# Patient Record
Sex: Female | Born: 1965
Health system: Southern US, Community
[De-identification: ages and names within clinical notes are randomized; demographics above are authoritative.]

## PROBLEM LIST (undated history)

## (undated) VITALS — BP 136/86 | HR 98 | Temp 97.9°F | Resp 18 | Ht 63.0 in | Wt 137.0 lb

## (undated) DIAGNOSIS — T1490XA Injury, unspecified, initial encounter: Secondary | ICD-10-CM

## (undated) DIAGNOSIS — K029 Dental caries, unspecified: Secondary | ICD-10-CM

## (undated) DIAGNOSIS — Z5189 Encounter for other specified aftercare: Secondary | ICD-10-CM

## (undated) DIAGNOSIS — F319 Bipolar disorder, unspecified: Secondary | ICD-10-CM

## (undated) DIAGNOSIS — K219 Gastro-esophageal reflux disease without esophagitis: Secondary | ICD-10-CM

## (undated) DIAGNOSIS — F419 Anxiety disorder, unspecified: Secondary | ICD-10-CM

## (undated) DIAGNOSIS — Z973 Presence of spectacles and contact lenses: Secondary | ICD-10-CM

## (undated) DIAGNOSIS — D573 Sickle-cell trait: Secondary | ICD-10-CM

## (undated) DIAGNOSIS — F329 Major depressive disorder, single episode, unspecified: Secondary | ICD-10-CM

## (undated) DIAGNOSIS — E119 Type 2 diabetes mellitus without complications: Secondary | ICD-10-CM

## (undated) DIAGNOSIS — D689 Coagulation defect, unspecified: Secondary | ICD-10-CM

## (undated) DIAGNOSIS — G8929 Other chronic pain: Secondary | ICD-10-CM

## (undated) DIAGNOSIS — R7303 Prediabetes: Secondary | ICD-10-CM

## (undated) DIAGNOSIS — F32A Depression, unspecified: Secondary | ICD-10-CM

## (undated) DIAGNOSIS — I10 Essential (primary) hypertension: Secondary | ICD-10-CM

## (undated) DIAGNOSIS — D649 Anemia, unspecified: Secondary | ICD-10-CM

## (undated) HISTORY — PX: OTHER SURGICAL HISTORY: SHX169

## (undated) HISTORY — DX: Type 2 diabetes mellitus without complications: E11.9

## (undated) HISTORY — DX: Injury, unspecified, initial encounter: T14.90XA

## (undated) HISTORY — PX: TUBAL LIGATION: SHX77

## (undated) HISTORY — DX: Coagulation defect, unspecified: D68.9

## (undated) HISTORY — PX: FACIAL RECONSTRUCTION SURGERY: SHX631

## (undated) HISTORY — PX: COLONOSCOPY: SHX174

## (undated) HISTORY — PX: FOOT SURGERY: SHX648

## (undated) HISTORY — DX: Encounter for other specified aftercare: Z51.89

---

## 1998-01-22 ENCOUNTER — Other Ambulatory Visit: Admission: RE | Admit: 1998-01-22 | Discharge: 1998-01-22 | Payer: Self-pay | Admitting: Internal Medicine

## 1998-02-23 ENCOUNTER — Emergency Department (HOSPITAL_COMMUNITY): Admission: EM | Admit: 1998-02-23 | Discharge: 1998-02-23 | Payer: Self-pay | Admitting: Emergency Medicine

## 1998-04-15 ENCOUNTER — Emergency Department (HOSPITAL_COMMUNITY): Admission: EM | Admit: 1998-04-15 | Discharge: 1998-04-15 | Payer: Self-pay | Admitting: Emergency Medicine

## 1998-09-10 ENCOUNTER — Emergency Department (HOSPITAL_COMMUNITY): Admission: EM | Admit: 1998-09-10 | Discharge: 1998-09-10 | Payer: Self-pay | Admitting: Emergency Medicine

## 1999-01-30 ENCOUNTER — Encounter: Payer: Self-pay | Admitting: Emergency Medicine

## 1999-01-30 ENCOUNTER — Emergency Department (HOSPITAL_COMMUNITY): Admission: EM | Admit: 1999-01-30 | Discharge: 1999-01-30 | Payer: Self-pay | Admitting: Emergency Medicine

## 1999-04-02 ENCOUNTER — Emergency Department (HOSPITAL_COMMUNITY): Admission: EM | Admit: 1999-04-02 | Discharge: 1999-04-02 | Payer: Self-pay | Admitting: Emergency Medicine

## 2002-08-10 ENCOUNTER — Emergency Department (HOSPITAL_COMMUNITY): Admission: EM | Admit: 2002-08-10 | Discharge: 2002-08-10 | Payer: Self-pay | Admitting: Emergency Medicine

## 2002-08-10 ENCOUNTER — Encounter: Payer: Self-pay | Admitting: Emergency Medicine

## 2002-11-16 ENCOUNTER — Emergency Department (HOSPITAL_COMMUNITY): Admission: EM | Admit: 2002-11-16 | Discharge: 2002-11-16 | Payer: Self-pay | Admitting: Emergency Medicine

## 2003-04-22 ENCOUNTER — Emergency Department (HOSPITAL_COMMUNITY): Admission: EM | Admit: 2003-04-22 | Discharge: 2003-04-22 | Payer: Self-pay | Admitting: Emergency Medicine

## 2003-08-18 ENCOUNTER — Emergency Department (HOSPITAL_COMMUNITY): Admission: EM | Admit: 2003-08-18 | Discharge: 2003-08-18 | Payer: Self-pay

## 2003-08-18 ENCOUNTER — Emergency Department (HOSPITAL_COMMUNITY): Admission: EM | Admit: 2003-08-18 | Discharge: 2003-08-18 | Payer: Self-pay | Admitting: Emergency Medicine

## 2003-08-21 ENCOUNTER — Emergency Department (HOSPITAL_COMMUNITY): Admission: EM | Admit: 2003-08-21 | Discharge: 2003-08-21 | Payer: Self-pay | Admitting: Emergency Medicine

## 2003-11-04 ENCOUNTER — Encounter: Admission: RE | Admit: 2003-11-04 | Discharge: 2003-11-04 | Payer: Self-pay | Admitting: Obstetrics and Gynecology

## 2003-11-04 ENCOUNTER — Encounter (INDEPENDENT_AMBULATORY_CARE_PROVIDER_SITE_OTHER): Payer: Self-pay | Admitting: Specialist

## 2003-12-19 ENCOUNTER — Emergency Department (HOSPITAL_COMMUNITY): Admission: EM | Admit: 2003-12-19 | Discharge: 2003-12-19 | Payer: Self-pay | Admitting: Emergency Medicine

## 2004-05-18 ENCOUNTER — Emergency Department (HOSPITAL_COMMUNITY): Admission: EM | Admit: 2004-05-18 | Discharge: 2004-05-18 | Payer: Self-pay | Admitting: Emergency Medicine

## 2004-06-24 ENCOUNTER — Emergency Department (HOSPITAL_COMMUNITY): Admission: EM | Admit: 2004-06-24 | Discharge: 2004-06-24 | Payer: Self-pay | Admitting: *Deleted

## 2004-07-15 ENCOUNTER — Ambulatory Visit: Payer: Self-pay | Admitting: Psychiatry

## 2004-07-15 ENCOUNTER — Inpatient Hospital Stay (HOSPITAL_COMMUNITY): Admission: EM | Admit: 2004-07-15 | Discharge: 2004-07-21 | Payer: Self-pay | Admitting: Psychiatry

## 2004-07-29 ENCOUNTER — Emergency Department (HOSPITAL_COMMUNITY): Admission: EM | Admit: 2004-07-29 | Discharge: 2004-07-29 | Payer: Self-pay | Admitting: Emergency Medicine

## 2004-08-04 ENCOUNTER — Emergency Department (HOSPITAL_COMMUNITY): Admission: EM | Admit: 2004-08-04 | Discharge: 2004-08-04 | Payer: Self-pay

## 2004-08-13 ENCOUNTER — Emergency Department (HOSPITAL_COMMUNITY): Admission: EM | Admit: 2004-08-13 | Discharge: 2004-08-13 | Payer: Self-pay | Admitting: Emergency Medicine

## 2004-09-18 ENCOUNTER — Emergency Department (HOSPITAL_COMMUNITY): Admission: EM | Admit: 2004-09-18 | Discharge: 2004-09-18 | Payer: Self-pay | Admitting: Emergency Medicine

## 2004-11-04 ENCOUNTER — Other Ambulatory Visit: Admission: RE | Admit: 2004-11-04 | Discharge: 2004-11-04 | Payer: Self-pay | Admitting: Family Medicine

## 2004-11-04 ENCOUNTER — Ambulatory Visit: Payer: Self-pay | Admitting: Family Medicine

## 2004-11-04 ENCOUNTER — Encounter (INDEPENDENT_AMBULATORY_CARE_PROVIDER_SITE_OTHER): Payer: Self-pay | Admitting: Specialist

## 2004-11-08 ENCOUNTER — Ambulatory Visit (HOSPITAL_COMMUNITY): Admission: RE | Admit: 2004-11-08 | Discharge: 2004-11-08 | Payer: Self-pay | Admitting: Obstetrics and Gynecology

## 2004-12-10 ENCOUNTER — Ambulatory Visit (HOSPITAL_COMMUNITY): Admission: RE | Admit: 2004-12-10 | Discharge: 2004-12-10 | Payer: Self-pay | Admitting: Orthopaedic Surgery

## 2005-03-08 ENCOUNTER — Emergency Department (HOSPITAL_COMMUNITY): Admission: EM | Admit: 2005-03-08 | Discharge: 2005-03-08 | Payer: Self-pay | Admitting: Emergency Medicine

## 2005-08-29 ENCOUNTER — Emergency Department (HOSPITAL_COMMUNITY): Admission: EM | Admit: 2005-08-29 | Discharge: 2005-08-29 | Payer: Self-pay | Admitting: Emergency Medicine

## 2005-09-06 ENCOUNTER — Emergency Department (HOSPITAL_COMMUNITY): Admission: EM | Admit: 2005-09-06 | Discharge: 2005-09-06 | Payer: Self-pay | Admitting: Emergency Medicine

## 2006-01-17 ENCOUNTER — Emergency Department (HOSPITAL_COMMUNITY): Admission: EM | Admit: 2006-01-17 | Discharge: 2006-01-17 | Payer: Self-pay | Admitting: Emergency Medicine

## 2006-02-04 ENCOUNTER — Emergency Department (HOSPITAL_COMMUNITY): Admission: EM | Admit: 2006-02-04 | Discharge: 2006-02-04 | Payer: Self-pay | Admitting: Emergency Medicine

## 2006-07-15 ENCOUNTER — Emergency Department (HOSPITAL_COMMUNITY): Admission: EM | Admit: 2006-07-15 | Discharge: 2006-07-16 | Payer: Self-pay | Admitting: Emergency Medicine

## 2007-10-16 ENCOUNTER — Emergency Department (HOSPITAL_COMMUNITY): Admission: EM | Admit: 2007-10-16 | Discharge: 2007-10-17 | Payer: Self-pay | Admitting: Emergency Medicine

## 2007-12-02 ENCOUNTER — Emergency Department (HOSPITAL_COMMUNITY): Admission: EM | Admit: 2007-12-02 | Discharge: 2007-12-02 | Payer: Self-pay | Admitting: Emergency Medicine

## 2008-10-24 ENCOUNTER — Emergency Department (HOSPITAL_COMMUNITY): Admission: EM | Admit: 2008-10-24 | Discharge: 2008-10-24 | Payer: Self-pay | Admitting: Emergency Medicine

## 2008-10-29 ENCOUNTER — Emergency Department (HOSPITAL_COMMUNITY): Admission: EM | Admit: 2008-10-29 | Discharge: 2008-10-29 | Payer: Self-pay | Admitting: Emergency Medicine

## 2008-12-28 ENCOUNTER — Emergency Department (HOSPITAL_COMMUNITY): Admission: EM | Admit: 2008-12-28 | Discharge: 2008-12-28 | Payer: Self-pay | Admitting: Emergency Medicine

## 2009-01-22 ENCOUNTER — Emergency Department (HOSPITAL_COMMUNITY): Admission: EM | Admit: 2009-01-22 | Discharge: 2009-01-22 | Payer: Self-pay | Admitting: Emergency Medicine

## 2009-04-05 ENCOUNTER — Emergency Department (HOSPITAL_COMMUNITY): Admission: EM | Admit: 2009-04-05 | Discharge: 2009-04-05 | Payer: Self-pay | Admitting: Emergency Medicine

## 2009-04-16 ENCOUNTER — Emergency Department (HOSPITAL_COMMUNITY): Admission: EM | Admit: 2009-04-16 | Discharge: 2009-04-17 | Payer: Self-pay | Admitting: Emergency Medicine

## 2009-04-18 ENCOUNTER — Emergency Department (HOSPITAL_COMMUNITY): Admission: EM | Admit: 2009-04-18 | Discharge: 2009-04-19 | Payer: Self-pay | Admitting: Emergency Medicine

## 2009-04-29 ENCOUNTER — Emergency Department (HOSPITAL_COMMUNITY): Admission: EM | Admit: 2009-04-29 | Discharge: 2009-04-29 | Payer: Self-pay | Admitting: Emergency Medicine

## 2009-05-11 ENCOUNTER — Ambulatory Visit (HOSPITAL_COMMUNITY): Admission: RE | Admit: 2009-05-11 | Discharge: 2009-05-11 | Payer: Self-pay | Admitting: Family Medicine

## 2009-05-11 ENCOUNTER — Emergency Department (HOSPITAL_COMMUNITY): Admission: EM | Admit: 2009-05-11 | Discharge: 2009-05-11 | Payer: Self-pay | Admitting: Emergency Medicine

## 2009-05-11 ENCOUNTER — Encounter: Payer: Self-pay | Admitting: Physician Assistant

## 2009-06-03 ENCOUNTER — Emergency Department (HOSPITAL_COMMUNITY): Admission: EM | Admit: 2009-06-03 | Discharge: 2009-06-03 | Payer: Self-pay | Admitting: Emergency Medicine

## 2009-07-10 ENCOUNTER — Encounter: Payer: Self-pay | Admitting: Physician Assistant

## 2009-07-15 ENCOUNTER — Encounter: Payer: Self-pay | Admitting: Physician Assistant

## 2009-07-15 ENCOUNTER — Ambulatory Visit: Payer: Self-pay | Admitting: Psychiatry

## 2009-07-15 ENCOUNTER — Emergency Department (HOSPITAL_COMMUNITY): Admission: EM | Admit: 2009-07-15 | Discharge: 2009-07-16 | Payer: Self-pay | Admitting: Emergency Medicine

## 2009-07-16 ENCOUNTER — Inpatient Hospital Stay (HOSPITAL_COMMUNITY): Admission: AD | Admit: 2009-07-16 | Discharge: 2009-07-18 | Payer: Self-pay | Admitting: Psychiatry

## 2009-07-30 ENCOUNTER — Ambulatory Visit: Payer: Self-pay | Admitting: Physician Assistant

## 2009-07-30 DIAGNOSIS — K219 Gastro-esophageal reflux disease without esophagitis: Secondary | ICD-10-CM | POA: Insufficient documentation

## 2009-07-30 DIAGNOSIS — L84 Corns and callosities: Secondary | ICD-10-CM | POA: Insufficient documentation

## 2009-07-30 DIAGNOSIS — E782 Mixed hyperlipidemia: Secondary | ICD-10-CM

## 2009-07-30 DIAGNOSIS — F1020 Alcohol dependence, uncomplicated: Secondary | ICD-10-CM | POA: Insufficient documentation

## 2009-07-30 DIAGNOSIS — D649 Anemia, unspecified: Secondary | ICD-10-CM

## 2009-07-30 DIAGNOSIS — D573 Sickle-cell trait: Secondary | ICD-10-CM | POA: Insufficient documentation

## 2009-07-30 LAB — CONVERTED CEMR LAB
Blood in Urine, dipstick: NEGATIVE
Ketones, urine, test strip: NEGATIVE
Nitrite: NEGATIVE
Protein, U semiquant: 30
Urobilinogen, UA: 0.2

## 2009-08-03 LAB — CONVERTED CEMR LAB
Albumin: 5 g/dL (ref 3.5–5.2)
Alkaline Phosphatase: 71 units/L (ref 39–117)
BUN: 11 mg/dL (ref 6–23)
Barbiturate Quant, Ur: NEGATIVE
Basophils Absolute: 0.2 10*3/uL — ABNORMAL HIGH (ref 0.0–0.1)
Calcium: 10.3 mg/dL (ref 8.4–10.5)
Cocaine Metabolites: NEGATIVE
Creatinine, Ser: 0.79 mg/dL (ref 0.40–1.20)
Creatinine,U: 73.1 mg/dL
Eosinophils Absolute: 0.2 10*3/uL (ref 0.0–0.7)
Eosinophils Relative: 2 % (ref 0–5)
Glucose, Bld: 77 mg/dL (ref 70–99)
Lymphocytes Relative: 34 % (ref 12–46)
MCHC: 33.5 g/dL (ref 30.0–36.0)
MCV: 91.6 fL (ref 78.0–100.0)
Marijuana Metabolite: NEGATIVE
Monocytes Absolute: 0.5 10*3/uL (ref 0.1–1.0)
Opiate Screen, Urine: NEGATIVE
Phencyclidine (PCP): NEGATIVE
Platelets: 266 10*3/uL (ref 150–400)
Potassium: 3.9 meq/L (ref 3.5–5.3)
RDW: 17.7 % — ABNORMAL HIGH (ref 11.5–15.5)
TSH: 2.399 microintl units/mL (ref 0.350–4.500)

## 2009-08-20 ENCOUNTER — Ambulatory Visit: Payer: Self-pay | Admitting: Physician Assistant

## 2009-08-20 ENCOUNTER — Telehealth: Payer: Self-pay | Admitting: Physician Assistant

## 2009-08-20 DIAGNOSIS — K047 Periapical abscess without sinus: Secondary | ICD-10-CM

## 2009-08-20 DIAGNOSIS — F329 Major depressive disorder, single episode, unspecified: Secondary | ICD-10-CM

## 2009-08-20 DIAGNOSIS — F418 Other specified anxiety disorders: Secondary | ICD-10-CM | POA: Insufficient documentation

## 2009-08-20 HISTORY — DX: Periapical abscess without sinus: K04.7

## 2009-08-20 LAB — CONVERTED CEMR LAB
Blood in Urine, dipstick: NEGATIVE
Glucose, Urine, Semiquant: NEGATIVE
Nitrite: NEGATIVE
Specific Gravity, Urine: >=1.03
WBC Urine, dipstick: NEGATIVE
pH: 5.5

## 2009-08-25 ENCOUNTER — Encounter: Payer: Self-pay | Admitting: Physician Assistant

## 2009-08-26 ENCOUNTER — Telehealth: Payer: Self-pay | Admitting: Physician Assistant

## 2009-09-15 ENCOUNTER — Encounter: Payer: Self-pay | Admitting: Physician Assistant

## 2009-09-17 ENCOUNTER — Telehealth: Payer: Self-pay | Admitting: Physician Assistant

## 2009-09-21 ENCOUNTER — Encounter: Payer: Self-pay | Admitting: Physician Assistant

## 2009-11-15 ENCOUNTER — Emergency Department (HOSPITAL_COMMUNITY): Admission: EM | Admit: 2009-11-15 | Discharge: 2009-11-15 | Payer: Self-pay | Admitting: Emergency Medicine

## 2009-11-16 ENCOUNTER — Encounter (INDEPENDENT_AMBULATORY_CARE_PROVIDER_SITE_OTHER): Payer: Self-pay | Admitting: Internal Medicine

## 2009-11-16 ENCOUNTER — Observation Stay (HOSPITAL_COMMUNITY): Admission: EM | Admit: 2009-11-16 | Discharge: 2009-11-18 | Payer: Self-pay | Admitting: Emergency Medicine

## 2009-11-18 ENCOUNTER — Telehealth: Payer: Self-pay | Admitting: Physician Assistant

## 2009-11-20 ENCOUNTER — Telehealth: Payer: Self-pay | Admitting: Physician Assistant

## 2009-11-24 ENCOUNTER — Telehealth: Payer: Self-pay | Admitting: Physician Assistant

## 2009-12-01 ENCOUNTER — Telehealth: Payer: Self-pay | Admitting: Physician Assistant

## 2009-12-08 ENCOUNTER — Telehealth (INDEPENDENT_AMBULATORY_CARE_PROVIDER_SITE_OTHER): Payer: Self-pay | Admitting: *Deleted

## 2009-12-08 ENCOUNTER — Ambulatory Visit: Payer: Self-pay | Admitting: Physician Assistant

## 2009-12-08 DIAGNOSIS — R319 Hematuria, unspecified: Secondary | ICD-10-CM

## 2009-12-08 DIAGNOSIS — K625 Hemorrhage of anus and rectum: Secondary | ICD-10-CM

## 2009-12-08 LAB — CONVERTED CEMR LAB
Pap Smear: NEGATIVE
Protein, U semiquant: 30
Specific Gravity, Urine: >=1.03
Urobilinogen, UA: 0.2
pH: 5

## 2009-12-09 ENCOUNTER — Encounter: Payer: Self-pay | Admitting: Physician Assistant

## 2009-12-09 LAB — CONVERTED CEMR LAB
ALT: 9 units/L (ref 0–35)
AST: 15 units/L (ref 0–37)
Albumin: 4.5 g/dL (ref 3.5–5.2)
Alkaline Phosphatase: 59 units/L (ref 39–117)
Basophils Relative: 4 % — ABNORMAL HIGH (ref 0–1)
Casts: NONE SEEN /lpf
Crystals: NONE SEEN
Eosinophils Absolute: 0.2 10*3/uL (ref 0.0–0.7)
Eosinophils Relative: 5 % (ref 0–5)
HCT: 31.8 % — ABNORMAL LOW (ref 36.0–46.0)
Hemoglobin: 10.4 g/dL — ABNORMAL LOW (ref 12.0–15.0)
LDL Cholesterol: 87 mg/dL (ref 0–99)
Lymphocytes Relative: 42 % (ref 12–46)
MCHC: 32.7 g/dL (ref 30.0–36.0)
Monocytes Absolute: 0.4 10*3/uL (ref 0.1–1.0)
Monocytes Relative: 10 % (ref 3–12)
Potassium: 4.2 meq/L (ref 3.5–5.3)
RBC: 3.64 M/uL — ABNORMAL LOW (ref 3.87–5.11)
RDW: 18.2 % — ABNORMAL HIGH (ref 11.5–15.5)
Sodium: 146 meq/L — ABNORMAL HIGH (ref 135–145)
Total Protein: 7.1 g/dL (ref 6.0–8.3)
WBC: 4.3 10*3/uL (ref 4.0–10.5)

## 2009-12-10 ENCOUNTER — Encounter: Payer: Self-pay | Admitting: Physician Assistant

## 2009-12-10 ENCOUNTER — Telehealth: Payer: Self-pay | Admitting: Physician Assistant

## 2009-12-11 ENCOUNTER — Encounter: Payer: Self-pay | Admitting: Physician Assistant

## 2009-12-11 LAB — CONVERTED CEMR LAB
Iron: 173 ug/dL — ABNORMAL HIGH (ref 42–145)
Saturation Ratios: 36 % (ref 20–55)
TIBC: 486 ug/dL — ABNORMAL HIGH (ref 250–470)
UIBC: 313 ug/dL
Vitamin B-12: 320 pg/mL (ref 211–911)

## 2009-12-21 ENCOUNTER — Encounter: Payer: Self-pay | Admitting: Physician Assistant

## 2009-12-25 ENCOUNTER — Ambulatory Visit: Payer: Self-pay | Admitting: Physician Assistant

## 2009-12-28 ENCOUNTER — Telehealth: Payer: Self-pay | Admitting: Physician Assistant

## 2009-12-28 ENCOUNTER — Encounter: Payer: Self-pay | Admitting: Physician Assistant

## 2009-12-28 LAB — CONVERTED CEMR LAB
Basophils Absolute: 0 10*3/uL (ref 0.0–0.1)
Basophils Relative: 1 % (ref 0–1)
Eosinophils Absolute: 0.1 10*3/uL (ref 0.0–0.7)
HCT: 31.2 % — ABNORMAL LOW (ref 36.0–46.0)
MCHC: 33 g/dL (ref 30.0–36.0)
MCV: 88.9 fL (ref 78.0–100.0)
Monocytes Absolute: 0.4 10*3/uL (ref 0.1–1.0)
Monocytes Relative: 9 % (ref 3–12)
Neutro Abs: 1.4 10*3/uL — ABNORMAL LOW (ref 1.7–7.7)
Neutrophils Relative %: 31 % — ABNORMAL LOW (ref 43–77)
Platelets: 219 10*3/uL (ref 150–400)
RDW: 18 % — ABNORMAL HIGH (ref 11.5–15.5)
WBC: 4.6 10*3/uL (ref 4.0–10.5)

## 2009-12-30 ENCOUNTER — Telehealth: Payer: Self-pay | Admitting: Physician Assistant

## 2009-12-30 ENCOUNTER — Ambulatory Visit: Payer: Self-pay | Admitting: Physician Assistant

## 2010-01-04 ENCOUNTER — Encounter (INDEPENDENT_AMBULATORY_CARE_PROVIDER_SITE_OTHER): Payer: Self-pay | Admitting: *Deleted

## 2010-01-11 ENCOUNTER — Ambulatory Visit: Payer: Self-pay | Admitting: Psychiatry

## 2010-01-11 ENCOUNTER — Inpatient Hospital Stay (HOSPITAL_COMMUNITY): Admission: AD | Admit: 2010-01-11 | Discharge: 2010-01-14 | Payer: Self-pay | Admitting: Psychiatry

## 2010-01-11 ENCOUNTER — Emergency Department (HOSPITAL_COMMUNITY): Admission: EM | Admit: 2010-01-11 | Discharge: 2010-01-12 | Payer: Self-pay | Admitting: Emergency Medicine

## 2010-01-26 ENCOUNTER — Telehealth: Payer: Self-pay | Admitting: Physician Assistant

## 2010-01-29 ENCOUNTER — Encounter: Payer: Self-pay | Admitting: Physician Assistant

## 2010-02-15 ENCOUNTER — Telehealth (INDEPENDENT_AMBULATORY_CARE_PROVIDER_SITE_OTHER): Payer: Self-pay | Admitting: Nurse Practitioner

## 2010-02-25 ENCOUNTER — Telehealth (INDEPENDENT_AMBULATORY_CARE_PROVIDER_SITE_OTHER): Payer: Self-pay | Admitting: Internal Medicine

## 2010-03-08 ENCOUNTER — Telehealth: Payer: Self-pay | Admitting: Physician Assistant

## 2010-03-18 ENCOUNTER — Emergency Department (HOSPITAL_COMMUNITY): Admission: EM | Admit: 2010-03-18 | Discharge: 2009-08-25 | Payer: Self-pay | Admitting: Emergency Medicine

## 2010-04-02 ENCOUNTER — Encounter (INDEPENDENT_AMBULATORY_CARE_PROVIDER_SITE_OTHER): Payer: Self-pay | Admitting: *Deleted

## 2010-04-22 ENCOUNTER — Ambulatory Visit: Admit: 2010-04-22 | Payer: Self-pay | Admitting: Internal Medicine

## 2010-05-02 ENCOUNTER — Encounter: Payer: Self-pay | Admitting: *Deleted

## 2010-05-11 NOTE — Letter (Signed)
Summary: BEHAVIORAL HEALTH NOTES  BEHAVIORAL HEALTH NOTES   Imported By: Arta Bruce 08/31/2009 10:00:58  _____________________________________________________________________  External Attachment:    Type:   Image     Comment:   External Document

## 2010-05-11 NOTE — Letter (Signed)
Summary: [facility]DS FROM [facility]  REQUESTING [facility]DS FROM [facility]   Imported By: Arta Bruce 10/28/2009 15:00:41  _____________________________________________________________________  External Attachment:    Type:   Image     Comment:   External Document

## 2010-05-11 NOTE — Progress Notes (Signed)
Summary: Dental Referral  Phone Note Outgoing Call   Summary of Call: Needs dental referral for dental abscess.  Please note she is taking Ibuprofen for pain due to medical reasons. Initial call taken by: Brynda Rim,  Aug 20, 2009 10:25 AM

## 2010-05-11 NOTE — Assessment & Plan Note (Signed)
Summary: NEW PT, PAIN IN FEET/LR LIVES CLOSER TO HSN/LR   Vital Signs:  Patient profile:   45 year old female Height:      65 inches Weight:      129 pounds BMI:     21.54 Temp:     98.4 degrees F oral Pulse rate:   111 / minute Pulse rhythm:   regular Resp:     18 per minute BP sitting:   163 / 121  (left arm) Cuff size:   regular  Vitals Entered By: Armenia Shannon (July 30, 2009 3:02 PM)  Serial Vital Signs/Assessments:  Time      Position  BP       Pulse  Resp  Temp     By 4:03 PM             160/110                        Tereso Newcomer PA-C  CC: pt is denist referral....  pt needs referral for feet.... , Hypertension Management Is Patient Diabetic? No Pain Assessment Patient in pain? no       Does patient need assistance? Functional Status Self care Ambulation Normal   Primary Care Provider:  Tereso Newcomer, PA-C  CC:  pt is denist referral....  pt needs referral for feet....  and Hypertension Management.  History of Present Illness: New Patient.  Alcoholism:  She was d/c [facility] 2 weeks ago.  She went through detox.  However, she is still drinking.  She wants to go to her daughter's graduation in 2 weeks.  She wants to go through detox again but does not want to go until after her daughter's graduation.  She drinks 2 1/5 of Vodka per day.  She drinks every day.  No real seizures.  She does "black out" a lot when she drinks.  Hypertension History:      She complains of headache, but denies chest pain and syncope.        Positive major cardiovascular risk factors include hyperlipidemia, hypertension, and current tobacco user.  Negative major cardiovascular risk factors include female age less than 44 years old.    Habits & Providers  Alcohol-Tobacco-Diet     Alcohol drinks/day: >5     Alcohol type: spirits     Feels need to cut down: yes     Feels annoyed by complaints: yes     Feels guilty re: drinking: yes     Needs 'eye opener' in am: yes  Tobacco Status: current     Cigarette Packs/Day: 2.0  Exercise-Depression-Behavior     Drug Use: no  Current Medications (verified): 1)  Famotidine 20 Mg Tabs (Famotidine) .... One Tab By Mouth Every Day 2)  Tramadol Hcl 50 Mg Tabs (Tramadol Hcl) .... Take One To Two Tab By Mouth 6 Hours As Needed For Pain 3)  Lorazepam 1 Mg Tabs (Lorazepam) .... One Tab By Mouth Every 8 Hours  Allergies (verified): No Known Drug Allergies  Past History:  Past Medical History: Hypertension   a.  previously on medications Sickle Cell Trait Anemia-NOS GERD Hyperlipidemia   a.  not sure if ever took meds Alcoholism   a.  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 Family History of Alcoholism/Addiction - father  Social History: unemployed  Divorced Current Smoker Alcohol use-yes Drug use-no 3 kids  Smoking Status:  current Packs/Day:  2.0 Drug Use:  no  Review of Systems       The patient complains of dyspnea on exertion.  The patient denies fever, chest pain, melena, hematochezia, and hematuria.    Physical Exam  General:  alert, well-developed, and well-nourished.   Head:  normocephalic and atraumatic.   Eyes:  pupils equal, pupils round, pupils reactive to light, and conjunctival injection.   Ears:  R ear normal and L ear normal.   Nose:  no external deformity.   Mouth:  pharynx pink and moist.   Neck:  supple and no cervical lymphadenopathy.   Lungs:  normal breath sounds.   Heart:  normal rate and regular rhythm.   Abdomen:  soft and non-tender.   Neurologic:  alert & oriented X3 and cranial  nerves II-XII intact.   Psych:  normally interactive and tearful.     Impression & Recommendations:  Problem # 1:  ALCOHOLISM (ICD-303.90) encouraged her to meet with our counselors to help with treatment she declines and states she is going to go to the ER when she gets back from her daughter's graduation explained to her that her current situation with alcohol and smoking is slowly killing her she understands and wants to stop drinking  Problem # 2:  HYPERTENSION (ICD-401.9)  start norvasc 5 mg once daily f/u with me in 2 weeks  Orders: T-Comprehensive Metabolic Panel ([id number]) T-TSH (09811-91478)  Her updated medication list for this problem includes:    Amlodipine Besylate 5 Mg Tabs (Amlodipine besylate) .Marland Kitchen... Take 1 tablet by mouth once a day for blood pressure  Problem # 3:  CALLUS, FOOT (ICD-700) refer to foot clinic for help with treatment of her calluses  Problem # 4:  SICKLE-CELL TRAIT (ICD-282.5)  check labs  Orders: T-CBC w/Diff ([id number])  Complete Medication List: 1)  Famotidine 20 Mg Tabs (Famotidine) .... Take 1 tablet by mouth two times a day 2)  Tramadol Hcl 50 Mg Tabs (Tramadol hcl) .... Take one to two tab by mouth 6 hours as needed for pain 3)  Amlodipine Besylate 5 Mg Tabs (Amlodipine besylate) .... Take 1 tablet by mouth once a day for blood pressure  Other Orders: T-Drug Screen-Urine, (single) [phone]) T-HIV Viral Load ([phone]) T-Syphilis Test (RPR) [phone])  Hypertension Assessment/Plan:      The patient's hypertensive risk group is category B: At least one risk factor (excluding diabetes) with no target organ damage.  Today's blood pressure is 163/121.  Her blood pressure goal is < 140/90.   Patient Instructions: 1)  Sign forms to get records from foot doctors that operated on your feet. 2)  Schedule appt with foot clinic for calluses. 3)  Please schedule a follow-up appointment in 2 weeks with Savon Bordonaro for blood  pressure.  4)  Tobacco is very bad for your health and your loved ones ! You should stop smoking !  5)  Stop smoking tips: Choose a quit date. Cut down before the quit date. Decide what you will do as a substitute when you feel the urge to smoke(gum, toothpick, exercise).  Prescriptions: FAMOTIDINE 20 MG TABS (FAMOTIDINE) Take  1 tablet by mouth two times a day  #60 x 5   Entered and Authorized by:   Tereso Newcomer PA-C   Signed by:   Tereso Newcomer PA-C on 07/30/2009   Method used:   Print then Give to Patient   RxID:   [id number] AMLODIPINE BESYLATE 5 MG TABS (AMLODIPINE BESYLATE) Take 1 tablet by mouth once a day for blood pressure  #30 x 5   Entered and Authorized by:   Tereso Newcomer PA-C   Signed by:   Tereso Newcomer PA-C on 07/30/2009   Method used:   Print then Give to Patient   RxID:   [id number]   Laboratory Results   Urine Tests  Date/Time Received: July 30, 2009 4:20 PM   Routine Urinalysis   Glucose: negative   (Normal Range: Negative) Bilirubin: negative   (Normal Range: Negative) Ketone: negative   (Normal Range: Negative) Spec. Gravity: 1.020   (Normal Range: 1.003-1.035) Blood: negative   (Normal Range: Negative) pH: 6.0   (Normal Range: 5.0-8.0) Protein: 30   (Normal Range: Negative) Urobilinogen: 0.2   (Normal Range: 0-1) Nitrite: negative   (Normal Range: Negative) Leukocyte Esterace: trace   (Normal Range: Negative)      Other Tests  Rapid HIV: negative

## 2010-05-11 NOTE — Progress Notes (Signed)
Summary: REFILLS  Phone Note Call from Patient   Summary of Call: THE PT NEEDS MORE REFILLS FROM TRAZADONE AND IBUPROFEN.  ([facility] [address])  WEAVER PA-C Initial call taken by: Manon Hilding,  November 18, 2009 3:12 PM  Follow-up for Phone Call        forward to provider Follow-up by: Armenia Shannon,  November 18, 2009 4:36 PM  Additional Follow-up for Phone Call Additional follow up Details #1::        I have not prescribed her Trazodone before.  Will not fill that.  Additional Follow-up by: Tereso Newcomer PA-C,  November 18, 2009 5:35 PM    Additional Follow-up for Phone Call Additional follow up Details #2::    number is disconnected. .... Armenia Shannon  November 19, 2009 10:45 AM   Left message on answering machine for pt to call back.Marland KitchenMarland KitchenArmenia Shannon  November 20, 2009 12:41 PM   Prescriptions: IBUPROFEN 800 MG TABS (IBUPROFEN) Take 1 tablet by mouth three times a day with food as needed for pain  #60 x 0   Entered and Authorized by:   Tereso Newcomer PA-C   Signed by:   Tereso Newcomer PA-C on 11/18/2009   Method used:   Electronically to        [facility] [phone]* (retail)       [address]       [place], [place]  [address]       Ph: [phone]       Fax: [phone]   RxID:   [id number]   Appended Document: REFILLS I didnt mean to sign off phone note....... Armenia Shannon  November 20, 2009 12:42 PM    i have another phone note (11-20-09) with same information is open.... Armenia Shannon  November 23, 2009 2:40 PM

## 2010-05-11 NOTE — Progress Notes (Signed)
Summary: NEEDS REFILLS  Phone Note Call from Patient Call back at Home Phone [phone]   Reason for Call: Refill Medication Summary of Call: WEAVER PT. MS Macadam SAYS THAT SHE NEEDS REFILLS AMLODIPINE, TRAZADONE, AND THE FAMOTIDINE. SHE [facility] ON [address] Initial call taken by: Leodis Rains,  November 20, 2009 9:54 AM  Follow-up for Phone Call        forward to provider....had not had a chance to let her know about the trazadone yet but will let her know once she calls me back Follow-up by: Armenia Shannon,  November 20, 2009 12:39 PM  Additional Follow-up for Phone Call Additional follow up Details #1::        needs follow up visit I have never given her trazodone Additional Follow-up by: Tereso Newcomer PA-C,  November 20, 2009 2:47 PM    Additional Follow-up for Phone Call Additional follow up Details #2::    Left message on answering machine for pt to call back.Marland KitchenMarland KitchenArmenia Shannon  November 23, 2009 12:29 PM   pt is aware and will get appt  to be seen..... Armenia Shannon  November 24, 2009 11:45 AM   Prescriptions: AMLODIPINE BESYLATE 5 MG TABS (AMLODIPINE BESYLATE) Take 1 tablet by mouth once a day for blood pressure  #30 x 2   Entered and Authorized by:   Tereso Newcomer PA-C   Signed by:   Tereso Newcomer PA-C on 11/20/2009   Method used:   Electronically to        [facility] [phone]* (retail)       [address]       [place], [place]  [address]       Ph: [phone]       Fax: [phone]   RxID:   [id number] FAMOTIDINE 20 MG TABS (FAMOTIDINE) Take 1 tablet by mouth two times a day  #60 x 2   Entered and Authorized by:   Tereso Newcomer PA-C   Signed by:   Tereso Newcomer PA-C on 11/20/2009   Method used:   Electronically to        [facility] [phone]* (retail)       [address]       [place], [place]  [address]       Ph: [phone]       Fax: [phone]   RxID:   [id number]

## 2010-05-11 NOTE — Miscellaneous (Signed)
Summary: Mammo  Clinical Lists Changes  Observations: Added new observation of MAMMRECACT: Screening mammogram in 1 year.    (05/11/2009 16:38) Added new observation of MAMMOGRAM: No specific mammographic evidence of malignancy.  Assessment: BIRADS 1.  (05/11/2009 16:38)      Mammogram  Procedure date:  05/11/2009  Findings:      No specific mammographic evidence of malignancy.  Assessment: BIRADS 1.   Comments:      Screening mammogram in 1 year.

## 2010-05-11 NOTE — Progress Notes (Signed)
Summary: Tylenol #3  Phone Note Refill Request  on December 30, 2009 2:06 PM  Refills Requested: Medication #1:  apap/codeine 300-30mg  pt says she will pick up script to take to pharmacy.... pt says this pain med is the only med that works... it looks as if Daphine Deutscher prescribe the med on 12-02-09 #20 x 0.... i do have bottle   Method Requested: Pick up at Office Initial call taken by: Armenia Shannon,  December 30, 2009 2:06 PM  Follow-up for Phone Call        What does she need it for? Follow-up by: Tereso Newcomer PA-C,  December 30, 2009 5:38 PM  Additional Follow-up for Phone Call Additional follow up Details #1::        pt says she has pain in her mouth.... pt says the denist appt in oct... pt says the doctor is just seeing her that day and not doing anything Additional Follow-up by: Armenia Shannon,  December 31, 2009 8:27 AM    Additional Follow-up for Phone Call Additional follow up Details #2::    I will fill for her x 1 only. Please remind her that this medicine is dangerous if taken with alcohol.  If she has been drinking alcohol, she should not take this medicine. Rx in basket for patient to pick up.Tereso Newcomer PA-C  December 31, 2009 1:14 PM    Left message on answering machine for pt to call back.Marland KitchenMarland KitchenMarland KitchenArmenia Shannon  December 31, 2009 3:54 PM  Left message on answering machine for pt to call back..Armenia Shannon  January 01, 2010 10:41 AM Left message on answering machine for pt to call back..will mail letter.Marland KitchenMarland KitchenArmenia Shannon  January 04, 2010 8:51 AM   New/Updated Medications: TYLENOL WITH CODEINE #3 300-30 MG TABS (ACETAMINOPHEN-CODEINE) 1 by mouth no more than two times a day as needed for severe pain Prescriptions: TYLENOL WITH CODEINE #3 300-30 MG TABS (ACETAMINOPHEN-CODEINE) 1 by mouth no more than two times a day as needed for severe pain  #20 x 0   Entered and Authorized by:   Tereso Newcomer PA-C   Signed by:   Tereso Newcomer PA-C on 12/31/2009   Method used:    Print then Give to Patient   RxID:   [id number]

## 2010-05-11 NOTE — Progress Notes (Signed)
Summary: GI Referral   Phone Note Call from Patient   Summary of Call: pt says she was in the hospital and was not able to get her  colonoscopy she would like to know can you reshedule her  Initial call taken by: Armenia Shannon,  January 26, 2010 12:25 PM  Follow-up for Phone Call        I send the Referral to [facility] waiting for an appt  Follow-up by: Cheryll Dessert,  January 26, 2010 5:43 PM

## 2010-05-11 NOTE — Progress Notes (Signed)
  Phone Note Refill Request Message from:  Patient on January 26, 2010 12:34 PM  Refills Requested: Medication #1:  TYLENOL WITH CODEINE #3 300-30 MG TABS 1 by mouth no more than two times a day as needed for severe pain. pt never came got the script on 12-31-09... I do have the script in my basket  Initial call taken by: Armenia Shannon,  January 26, 2010 12:34 PM  Follow-up for Phone Call        This was requested a month ago. I do not agree in filling this now until I find out why she did not pick up.  Also, need to know what is status of dental appt. Tereso Newcomer PA-C  January 26, 2010 12:36 PM   Additional Follow-up for Phone Call Additional follow up Details #1::        she did not have ride and she misse dental appt b/c she was in hospital...pt says you can call her [phone].Marland Kitchen Armenia Shannon  January 26, 2010 12:49 PM     Additional Follow-up for Phone Call Additional follow up Details #2::    Looks like she went throught detox for alcohol. Not really a good idea to use narcotics in her recovery process. The Rx needs to be destroyed and she cannot have it. She can use ibuprofen as needed for pain.  She can also use tylenol as needed. She needs her dental appt rescheduled. If she is having uncontrolled dental pain, she can make an appt. Follow-up by: Tereso Newcomer PA-C,  January 26, 2010 1:31 PM  Additional Follow-up for Phone Call Additional follow up Details #3:: Details for Additional Follow-up Action Taken: pt is aware Additional Follow-up by: Armenia Shannon,  January 26, 2010 2:42 PM  New/Updated Medications: TYLENOL WITH CODEINE #3 300-30 MG TABS (ACETAMINOPHEN-CODEINE) 1 by mouth no more than two times a day as needed for severe pain

## 2010-05-11 NOTE — Progress Notes (Signed)
Summary: Office Visit//DEPRESSION SCREENING  Office Visit//DEPRESSION SCREENING   Imported By: Arta Bruce 10/15/2009 12:12:21  _____________________________________________________________________  External Attachment:    Type:   Image     Comment:   External Document

## 2010-05-11 NOTE — Letter (Signed)
Summary: MAILED REQUESTED RECORDS TO [facility]  MAILED REQUESTED RECORDS TO [facility]   Imported By: Arta Bruce 01/29/2010 14:34:13  _____________________________________________________________________  External Attachment:    Type:   Image     Comment:   External Document

## 2010-05-11 NOTE — Progress Notes (Signed)
Summary: GI referral  Phone Note Outgoing Call   Summary of Call: Refer to GI for rectal bleeding. Patient with h/o alcoholism.  Please consider EGD if felt to be indicated.  Initial call taken by: Tereso Newcomer PA-C,  December 08, 2009 1:05 PM  Follow-up for Phone Call        PT HAVE AN APPT DR Doreatha Martin  01-20-10 @ 1PM    PT AWARE OF HIS APPT SHE WILL COME IN OCTOBER TO PICK UP PRESCRIPTION & INSTRUCTIONS PAPER  Follow-up by: Cheryll Dessert,  December 10, 2009 11:48 AM

## 2010-05-11 NOTE — Progress Notes (Signed)
Summary: ? ABOUT HER MEDS  Phone Note Call from Patient Call back at Home Phone [phone] Call back at OR [phone]-NOVOICE MAIL   Reason for Call: Refill Medication Summary of Call: WEAVER PT.  MS Pond SAYS THAT SHE WILL NEED THE NURSE TO CALL HER ABOUT HER MEDS. SHE NEEDS FOLIC ACID, MIRALX AND TYLENOL W/CODINE, MS Kuchenbecker SAYS THAT IBUPROFEN DIDNT HELP WITH HER PROBLEMS WITH HER FEET AND MOUTH, THATS WHY [facility] PRESCRIBED THE TYLENOL W/CODINE.  Initial call taken by: Leodis Rains,  February 15, 2010 10:32 AM  Follow-up for Phone Call        Confirmed meds with pt.  Needs the tylenol with codeine because she has "bad feet and bad teeth" and would like the Rx re-sent. Let her know that she can call [facility] for refills.   Follow-up by: Dutch Quint RN,  February 15, 2010 11:14 AM  Additional Follow-up for Phone Call Additional follow up Details #1::        tylenol with codiene printed - fax to pt's pharmacy (rx in basket)(she will not be able to get this from [facility] pharmacy or [facility]) call the other meds - miralax and folic acid into pt's pharmacy (she may use an outside pharmacy for all - unsure and original note did not indicate) Additional Follow-up by: Lehman Prom FNP,  February 15, 2010 12:38 PM    Additional Follow-up for Phone Call Additional follow up Details #2::    She still has refills of miralax and folic acid -- advised to call [facility] at [address] for her refills and that Rx faxed there as well.  Dutch Quint RN  February 15, 2010 2:35 PM   New/Updated Medications: TYLENOL WITH CODEINE #3 300-30 MG TABS (ACETAMINOPHEN-CODEINE) 1 by mouth no more than two times a day as needed for severe pain Prescriptions: TYLENOL WITH CODEINE #3 300-30 MG TABS (ACETAMINOPHEN-CODEINE) 1 by mouth no more than two times a day as needed for severe pain  #20 x 0   Entered and Authorized by:   Lehman Prom FNP   Signed by:   Lehman Prom FNP on 02/15/2010   Method used:   Printed  then faxed to ...       [facility] [address] [phone]* (retail)       [address]       [place], [place]  [address]       Ph: [phone]       Fax: [phone]   RxID:   [id number]

## 2010-05-11 NOTE — Letter (Signed)
Summary: Discharge Summary  Discharge Summary   Imported By: Arta Bruce 12/21/2009 10:40:53  _____________________________________________________________________  External Attachment:    Type:   Image     Comment:   External Document

## 2010-05-11 NOTE — Progress Notes (Signed)
Summary: broken tooth unable to eat  Phone Note Call from Patient   Summary of Call: The pt states that she has couple broken teeth that unable her to eat; pt wondered if the proivder can referral to a dentist (orange card) [name] PA-c Initial call taken by: Manon Hilding,  November 24, 2009 11:49 AM  Follow-up for Phone Call        Sent to [facility].  Dutch Quint RN  November 24, 2009 11:50 AM   Additional Follow-up for Phone Call Additional follow up Details #1::        Unless she had an ongoing infection will not be able to get her into the dental clinic. If she is not sure, she would need to be seen first Additional Follow-up by: Julieanne Manson MD,  November 25, 2009 9:46 PM    Additional Follow-up for Phone Call Additional follow up Details #2::    States she is unsure if there is an infection. Confirmed appt. for 8/30.  Advised to call for cancellations and if teeth get worse to call back- verbalized understanding.  Dutch Quint RN  November 26, 2009 11:45 AM

## 2010-05-11 NOTE — Miscellaneous (Signed)
Summary: Podiatry Notes reviewed  Clinical Lists Changes  Observations: Added new observation of PAST SURG HX: s/p bilat foot surgeries (2 on left and 1 on right)   a.  patient cannot tell me why surgery was done   b.  12/2000: shortening Austin bunionectomy on right and 5th metatarsal osteotomy bilaterally Lysle Dingwall, DPM)   c.  surgery again considered in 3.2003 due to persistent sub-fifth lesions with plantar-flexed 5th metatarsals bilat. . . patient no showed to f/u appt.  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  patient cannot tell me why surgery was done   b.  12/2000: shortening Austin bunionectomy on right and 5th metatarsal osteotomy bilaterally Lysle Dingwall, DPM)   c.  surgery again considered in 3.2003 due to persistent sub-fifth lesions with plantar-flexed 5th metatarsals bilat. . . patient no showed to f/u appt.  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

## 2010-05-11 NOTE — Progress Notes (Signed)
Summary: REFILL ON FOLIC ACID & MIRALAX  Phone Note Call from Patient Call back at Home Phone [phone]   Reason for Call: Refill Medication Summary of Call: WEAVER PT. MS West CALLED TO CALRIFY THAT SHE NEVER EVER RECEIVED THE FOLIC ACID OR THE MIRALAX. SHE NOW WOULD  LIKE FOR THEM TO BE CALLED INTO [facility] ON [address] Initial call taken by: Leodis Rains,  February 25, 2010 12:51 PM  Follow-up for Phone Call        Left message with female for pt. to return call.  Dutch Quint RN  February 26, 2010 2:07 PM    Additional Follow-up for Phone Call Additional follow up Details #1::        Spoke with Pt. she has RX @ [facility] and wants to transfer to [place]. Advised meds would be $6.00 ea She may want to check with [facility] could be on the $4.00 list. Additional Follow-up by: Gaylyn Cheers RN,  March 02, 2010 12:54 PM

## 2010-05-11 NOTE — Letter (Signed)
Summary: PODIATRY NOTES  PODIATRY NOTES   Imported By: Arta Bruce 09/21/2009 11:19:45  _____________________________________________________________________  External Attachment:    Type:   Image     Comment:   External Document

## 2010-05-11 NOTE — Progress Notes (Signed)
Summary: Draining cyst  Phone Note Call from Patient Call back at [phone]   Summary of Call: The pt went to the Dentilt yesterday but they only did the x-rays and she only receive some antibiotics but she went back again to the waiting list because she needs to see another dentilst.  The pt has abscess and at this moment she has pain down below ( the bad odor down belog embarrased her a lot that she needs to watch her clhthing contstantly).  Pt wants to make sure if [place] can call her back. Alben Spittle PA-c   Initial call taken by: Manon Hilding,  September 17, 2009 12:55 PM  Follow-up for Phone Call        Pt. states has a hx of Bartholin cyst. She currently has draining cyst on each side of her labia. Lg amt. of foul smelling drainage. Denies fever or pain.  Encouraged to apply warm compresses to encourage drainage. Has CPP scheduled 10/15/09. Your next available appt. 09/28/09. Do you want [place] to work her in today or tomorrow? Send to Urgent care? Follow-up by: Gaylyn Cheers RN 09/21/09 11:46  Additional Follow-up for Phone Call Additional follow up Details #1::        Majority of Bartholin's Abscesses resolve with spontaneous drainage and do not need antibiotics. Suggest she use warm compresses 2-3 x per day.  Also, suggest she do warm Sitz baths.  She can use ibuprofen or tramadol as needed for pain (on her med list).  If she has redness surrounding the area that seems to be spreading or she notes that the drainage stops and the swelling increases with increased pain or she runs a fever (100 degrees or higher) or just feels worse, she should go to urgent care or ED at [facility].  In these cases, she may need antibiotics or I&D.  I would not be able to do the I&D here.  She would likely have to get this done with GYN.  Otherwise, can make appt 6/20 for recheck after doing the above.  If someone cancels, can put her in to come in sooner or on another providers schedule if she wants to be seen  sooner. Additional Follow-up by: Tereso Newcomer PA-C,  September 21, 2009 1:25 PM    Additional Follow-up for Phone Call Additional follow up Details #2::    Spoke with pt. and advised of provider's response and recommendations.  States she will check with urgent care and ED for treatment.  Appt. for CPP made for 10/07/09 -- will come in sooner if needed.  Dutch Quint RN  September 21, 2009 3:55 PM

## 2010-05-11 NOTE — Progress Notes (Signed)
Summary: SCHEDULING LAB  Phone Note Call from Patient   Caller: Patient Reason for Call: Talk to Nurse, Lab or Test Results Summary of Call: PT WANTED TO KNOW IF SHE NEEDED TO BE SCHEDULE FOR ANOTHER LAB Initial call taken by: Oscar La,  December 28, 2009 8:18 AM  Follow-up for Phone Call        looked over notes and pt does need to come in..... [place] Shannon  December 28, 2009 4:00 PM

## 2010-05-11 NOTE — Progress Notes (Signed)
Summary: pt returning your call  Phone Note Call from Patient Call back at [phone]   Summary of Call: pt returning your call please call her at [phone] or [phone] Initial call taken by: Domenic Polite,  January 26, 2010 12:08 PM  Follow-up for Phone Call        spoke with pt and she let me know that she missed the colonoscopy appt and she needs it reshedule Follow-up by: [place] Shannon,  January 26, 2010 12:22 PM

## 2010-05-11 NOTE — Assessment & Plan Note (Signed)
Summary: CPP // tl   Vital Signs:  Patient profile:   45 year old female Menstrual status:  regular LMP:     11/30/2009 Height:      65 inches Weight:      134.7 pounds BMI:     22.50 Temp:     98.9 degrees F oral Pulse rate:   88 / minute Pulse rhythm:   regular Resp:     18 per minute BP sitting:   110 / 80  (left arm) Cuff size:   regular  Vitals Entered By: Dutch Quint RN (December 08, 2009 11:34 AM) CC: CPP... Is Patient Diabetic? No Pain Assessment Patient in pain? no       Does patient need assistance? Functional Status Self care Ambulation Normal LMP (date): 11/30/2009     Menstrual Status regular Enter LMP: 11/30/2009   Primary Care Provider:  Tereso Newcomer, PA-C  CC:  CPP....  History of Present Illness: Here for CPP. Periods usually regular. Sometimes heavy and then spotting. Usually lasts 7 days. NO h/o abnl paps. No vaginal, discharge, odor or itching. Has h/o Bartholin's Cysts.  Occurs occasionally.  Has had lanced in the past. No abnl mammo's in past. No breast, colon or ovarian cancer.     Alcoholism:  Still drinking.  Has sought admission for detox.  She has some personal items to take care of and then she will go.    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  previously on medications Sickle Cell Trait Anemia-NOS GERD Hyperlipidemia   a.  not sure if ever took meds Alcoholism   a.  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  patient cannot tell me why surgery was done   b.  12/2000: shortening Austin bunionectomy on right and 5th metatarsal osteotomy bilaterally Lysle Dingwall, DPM)   c.  surgery again considered in 3.2003 due to persistent sub-fifth lesions with plantar-flexed 5th metatarsals bilat. . . patient no showed to f/u appt.  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 "hole in heart" - Mom CAD - Mom (died in 40s  -  ??) DM - grandmother Family History Hypertension -  mom No FHx of colon, ovarian or breast cancer. Family History of Alcoholism/Addiction - father  Social History: Reviewed history from 07/30/2009 and no changes required. unemployed  Divorced Current Smoker Alcohol use-yes Drug use-no 3 kids   Review of Systems      See HPI General:  Complains of chills. CV:  Denies chest pain or discomfort, fainting, and shortness of breath with exertion. Resp:  Denies cough. GI:  Complains of bloody stools, constipation, and dark tarry stools. GU:  Complains of dysuria, hematuria, and urinary frequency. Derm:  Denies lesion(s). Psych:  Complains of depression.  Physical Exam  General:  alert, well-developed, and well-nourished.  Head:  normocephalic and atraumatic.   Eyes:  pupils equal, pupils round, and pupils reactive to light.   Ears:  R ear normal and L ear normal.   Nose:  no nasal discharge.   Mouth:  oral mucosa pink no lesions on buccal mucosa patient unable to tolerate exam to expose post pharynx Neck:  supple, no thyromegaly, no carotid bruits, and no cervical lymphadenopathy.   Lungs:  normal breath sounds, no crackles, and no wheezes.   Heart:  normal rate, regular rhythm, and no murmur.   Abdomen:  soft, non-tender, and no hepatomegaly.   Rectal:  normal  sphincter tone, no masses, no tenderness, no perianal rash, and external hemorrhoid(s).   Genitalia:  normal introitus, no external lesions, no vaginal discharge, mucosa pink and moist, no vaginal or cervical lesions, no vaginal atrophy, no friaility or hemorrhage, normal uterus size and position, and no adnexal masses or tenderness.   Msk:  normal ROM.   Extremities:  no edema  Neurologic:  alert & oriented X3 and cranial nerves II-XII intact.   Skin:  no suspicious lesions.   Psych:  normally interactive and tearful.     Impression & Recommendations:  Problem # 1:  DEPRESSION (ICD-311)  no suicidal ideations open to counseling concerned about meds with her alcoholism  Orders: Psychology Referral (Psychology)  Problem # 2:  ALCOHOLISM (ICD-303.90)  has appt to get into tx tx of alcoholism will help her depression  Orders: Psychology Referral (Psychology)  Problem # 3:  PREVENTIVE HEALTH CARE (ICD-V70.0)  Orders: KOH/ WET Mount [phone]) T-Pap Smear, Thin Prep (60454) T- GC Chlamydia (09811) Mammogram (Screening) (Mammo) UA Dipstick w/o Micro (manual) (81002)  Problem # 4:  HEMATURIA UNSPECIFIED (ICD-599.70)  has symptoms of UTI has noted for about 2 mos. somewhat vague about symptoms  will go ahead and treat with duration of symptoms will have her take antibx for 1 week  Orders: UA Dipstick w/o Micro (manual) (91478) T-Culture, Urine ([id number]) T- * Misc. Laboratory test [phone]) T-CBC w/Diff [id number]) T-Comprehensive Metabolic Panel [phone])  Her updated medication list for this problem includes:    Cipro 500 Mg Tabs (Ciprofloxacin hcl) .Marland Kitchen... Take 1 tablet by mouth two times a day  Problem # 5:  RECTAL BLEEDING (ICD-569.3)  vague about symptoms  has had for months notes at times with pain and straining and again at other times check stool cards check CBC prob from hemorrhoids will give high fiber diet info and Rx for miralax refer to GI with  her description of her rectal bleeding . . . prob need eval with colo  Orders: Gastroenterology Referral (GI) T-Hemoccult Cards-Multiple (36644) T-CBC w/Diff ([id number]) T-Comprehensive Metabolic Panel ([id number])  Complete Medication List: 1)  Famotidine 20 Mg Tabs (Famotidine) .... Take 1 tablet by mouth two times a day 2)  Amlodipine Besylate 5 Mg Tabs (Amlodipine besylate) .... Take 1 tablet by mouth once a day for blood pressure 3)  Ibuprofen 800 Mg Tabs (Ibuprofen) .... Take 1 tablet by mouth three times a day with food as needed for pain 4)  Tylenol With Codeine #3 300-30 Mg Tabs (Acetaminophen-codeine) .... One tablet by mouth two times a day as needed for pain 5)  Multivitamins Tabs (Multiple vitamin) .... Take 1 tablet by mouth once a day 6)  Vitamin B-1 100 Mg Tabs (Thiamine hcl) .... Take 1 tablet by mouth once a day 7)  Folic Acid 1 Mg Tabs (Folic acid) .... Take 1 tablet by mouth once a  day 8)  Cipro 500 Mg Tabs (Ciprofloxacin hcl) .... Take 1 tablet by mouth two times a day  Other Orders: T-Lipid Profile ([id number])  Patient Instructions: 1)  Take a multivitamin once daily. 2)  Take Calcium 600 mg and Vitamin D 400 units two times a day. 3)  Take Thiamine (Vitamin B1) 100 mg once daily. 4)  Take folic acid once daily.  I gave you a prescription. 5)  Arna Medici:  Please check on referral to dental clinic.  Patient has had xrays but no follow up and she cannot reach anyone by telephone. 6)  Schedule appt with Ethelene Browns. 7)  [place] Td shot. 8)  Call for flu shot in 4-6 weeks. 9)  Follow high fiber diet. 10)  Use miralax once daily as needed for constipation.  IF you have to take once daily every day to allow you to have a bowel movement every day that is not hard to pass, take it every day.  Never take more than once daily.  And stop it if you have diarrhea. 11)  Take the cipro for a bladder infection.  Take until it is all gone. 12)  Please schedule a follow-up  appointment in 2-3 weeks with Scott for follow up on urine. 13)    Prescriptions: CIPRO 500 MG TABS (CIPROFLOXACIN HCL) Take 1 tablet by mouth two times a day  #14 x 0   Entered and Authorized by:   Tereso Newcomer PA-C   Signed by:   Tereso Newcomer PA-C on 12/08/2009   Method used:   Print then Give to Patient   RxID:   [id number] FOLIC ACID 1 MG TABS (FOLIC ACID) Take 1 tablet by mouth once a day  #30 x 11   Entered and Authorized by:   Tereso Newcomer PA-C   Signed by:   Tereso Newcomer PA-C on 12/08/2009   Method used:   Print then Give to Patient   RxID:   [id number]   Laboratory Results   Urine Tests    Routine Urinalysis   Glucose: negative   (Normal Range: Negative) Bilirubin: negative   (Normal Range: Negative) Ketone: negative   (Normal Range: Negative) Spec. Gravity: >=1.030   (Normal Range: 1.003-1.035) Blood: large   (Normal Range: Negative) pH: 5.0   (Normal Range: 5.0-8.0) Protein: 30   (Normal Range: Negative) Urobilinogen: 0.2   (Normal Range: 0-1) Nitrite: negative   (Normal Range: Negative) Leukocyte Esterace: small   (Normal Range: Negative)      Wet Mount Source: vaginal WBC/hpf: 1-5 Bacteria/hpf: rare Clue cells/hpf: none  Positive whiff Yeast/hpf: none Wet Mount KOH: Negative Trichomonas/hpf: none  Stool - Occult Blood Hemmoccult #1: negative Date: 12/08/2009    Appended Document: CPP // tl    Clinical Lists Changes  Medications: Added new medication of MIRALAX  POWD (POLYETHYLENE GLYCOL 3350) Dissolve 1 capful in a glass of water and drink one glass once daily as needed - Signed Rx of MIRALAX  POWD (POLYETHYLENE GLYCOL 3350) Dissolve 1 capful in a glass of water and drink one glass once daily as needed;  #1 bottle x 3;  Signed;  Entered by: Tereso Newcomer PA-C;  Authorized by: Tereso Newcomer PA-C;  Method used: Printed then faxed to [facility] [phone]*, [address] [place], [place]  [address], Ph: [phone], Fax:  [phone]    Prescriptions: MIRALAX  POWD (POLYETHYLENE GLYCOL 3350) Dissolve 1 capful in a glass of water and drink one glass once daily as needed  #1  bottle x 3   Entered and Authorized by:   Tereso Newcomer PA-C   Signed by:   Tereso Newcomer PA-C on 12/08/2009   Method used:   Printed then faxed to ...       [facility] [address] [phone]* (retail)       [address]       [place], [place]  [address]       Ph: [phone]       Fax: [phone]   RxID:   [id number]

## 2010-05-11 NOTE — Progress Notes (Signed)
Summary: Wants meds refilled  Phone Note Call from Patient   Reason for Call: Refill Medication Summary of Call: NEEDS HER MIRLAX,CIPRO AND FOLIC ACID TO [facility]//DID NOT PICK UP SCRIPT AT [facility]  [phone] SHE SAID SHE DI NOT WANT THEM FILLED @ [facility] Initial call taken by: Arta Bruce,  March 08, 2010 12:17 PM  Follow-up for Phone Call        Pt. advised that she doesn't want to use [facility].  Called [facility] to have meds transferred there per her request.  Cipro was Rx'd in August; states she never picked up Rx.  Advised that she would need to come in to be retested-pt. says that she will wait until her next appt. on 12/2 for retest. Follow-up by: Dutch Quint RN,  March 08, 2010 12:58 PM

## 2010-05-11 NOTE — Progress Notes (Signed)
  Phone Note Call from Patient Call back at [facility] Phone [phone]   Summary of Call: The pt is requesting [place] call her back because she would like a refill in [facility] [address]) Alben Spittle PA-c Initial call taken by: Manon Hilding,  Aug 26, 2009 4:10 PM  Follow-up for Phone Call        pt would like to know was you going to prescribe the ademan... she was unaware what the answer was last time Follow-up by: [place] Shannon,  Aug 27, 2009 12:12 PM  Additional Follow-up for Phone Call Additional follow up Details #1::        I don't know what the medicine is that she is talking about. Additional Follow-up by: Tereso Newcomer PA-C,  Aug 27, 2009 11:09 PM    Additional Follow-up for Phone Call Additional follow up Details #2::    spoke with pt and its lorazepam... pt says she is going through withdraw... pt says she is sweating, getting hives and not able to sleep even though you gave her meds for that .. pt says she starts her inpatinent treatment next tuesday.. [place] Shannon  Aug 28, 2009 9:22 AM   Additional Follow-up for Phone Call Additional follow up Details #3:: Details for Additional Follow-up Action Taken: She needs to go to the ED if she is going through withdrawal. I told her at the last appt that I will not be giving her Lorazepam.   spoke with pt and informed her of providers decision... [place] Shannon  Sep 02, 2009 11:26 AM  Additional Follow-up by: Tereso Newcomer PA-C,  Aug 28, 2009 1:46 PM

## 2010-05-11 NOTE — Progress Notes (Signed)
Summary: bad dental pain  Phone Note Call from Patient Call back at [phone]   Summary of Call: Pt calling back.  She still has dental appt on next Tues. ,but the ibuprofen is not helping her pain and wants to know if she can have something stronger.  [facility] [address]  [place].  Pt can be reached at [phone]. Initial call taken by: Vesta Mixer CMA,  December 01, 2009 11:52 AM  Follow-up for Phone Call        Left message on voicemail for pt. to return call.  Dutch Quint RN  December 01, 2009 3:27 PM   Additional Follow-up for Phone Call Additional follow up Details #1::        MS [place] CALLED AGAIN, SAYS SHE IS STILL WAITING ON SOMEONE  TO CALL HER AND HELP HER. SHE IS HAVING SEVERE PAIN GOING THRU HER MOUTH AND THE IBUPROFEN IS JUST NOT WORKING. MS [place] SAYS SHE DOES NOT HAVE A DENTAL APPT. SHE HAS BEEN WAITING ON THEM TO CALL HER AND SHE HAS CALLED THEM &SHE HAS NERVES EXPOSED. Additional Follow-up by: Leodis Rains,  December 02, 2009 8:57 AM    Additional Follow-up for Phone Call Additional follow up Details #2::    On waiting list for dental referral, has already xrays and is waiting for appt. to be treated.  Everything "is a mess" in her mouth.     Pain is very severe, barely eating or drinking.  Ibuprofen 800 mg. is ineffective. Gums are swollen, teeth are continuing to break off/fall off, says she is having light reddish and purulent drainage, nerves are fully exposed on all of her bottom teeth and on her upper right side.  Needs something stronger for pain until she can get into the dental clinic.  Doesn't get any sleep, has constant headache and earache radiating from her teeth.  Has appt. with Wende Mott on the 30th, but can't wait that long. Follow-up by: Dutch Quint RN,  December 02, 2009 10:30 AM  Additional Follow-up for Phone Call Additional follow up Details #3:: Details for Additional Follow-up Action Taken: will give tylenol #3 for pain as needed. will give 20 tabs - she  needs to be sure to take with food and advise pt that it may cause some sedation Rx in basket - fax to pt's pharmacy n.martin,fnp December 02, 2009  1:40 PM  Advised of new Rx and instructions for use-- faxed to [name] [address] per request.  Dutch Quint RN  December 02, 2009 2:27 PM  New/Updated Medications: TYLENOL WITH CODEINE #3 300-30 MG TABS (ACETAMINOPHEN-CODEINE) One tablet by mouth two times a day as needed for pain Prescriptions: TYLENOL WITH CODEINE #3 300-30 MG TABS (ACETAMINOPHEN-CODEINE) One tablet by mouth two times a day as needed for pain  #20 x 0   Entered and Authorized by:   Lehman Prom FNP   Signed by:   Lehman Prom FNP on 12/02/2009   Method used:   Printed then faxed to ...       [facility] [address] [phone]* (retail)       [address]       [place], [place]  [address]       Ph: [phone]       Fax: [phone]   RxID:   [id number]

## 2010-05-11 NOTE — Assessment & Plan Note (Signed)
Summary: FU WITH Kendra Griffith//GK   Vital Signs:  Patient profile:   45 year old female Height:      65 inches Weight:      134 pounds BMI:     22.38 Temp:     98.4 degrees F oral Pulse rate:   109 / minute Pulse rhythm:   regular Resp:     18 per minute BP sitting:   151 / 98  (left arm) Cuff size:   regular  Vitals Entered By: [place] Shannon (Aug 20, 2009 9:47 AM) CC: pt is here f/u... pt says she has not got her meds yet.. Is Patient Diabetic? No Pain Assessment Patient in pain? no       Does patient need assistance? Functional Status Self care Ambulation Normal   Primary Care Provider:  Tereso Newcomer, PA-C  CC:  pt is here f/u... pt says she has not got her meds yet...  History of Present Illness: Here for f/u. Never got BP meds.  Is going to try to get today.  Still drinking 2 fifths of Vodka per day.  PHQ9=24 today.  Denies suicidal ideations.  When I first met her she was intent on going back to the ED for admission for detox.    Notes some swelling and pain right lower jaw over last 3 days.  No fever.  Has had to take antibiotics in past for this.  Says none in a year.  No dental care in a long time.  Notes facial swelling.    Allergies: No Known Drug Allergies  Physical Exam  General:  alert, well-developed, and well-nourished.   Head:  normocephalic and atraumatic.   Mouth:  poor dentition and gingival inflammation right lower jaw Lungs:  normal breath sounds.   Heart:  normal rate and regular rhythm.   Rectal:  no edema Neurologic:  alert & oriented X3 and cranial nerves II-XII intact.   Psych:  not suicidal and tearful.     Impression & Recommendations:  Problem # 1:  HYPERTENSION (ICD-401.9)  uncontrolled not taking meds d/w patient that she needs to take meds in order for me to take care of her she will try to get today  Her updated medication list for this problem includes:    Amlodipine Besylate 5 Mg Tabs (Amlodipine besylate) .Marland Kitchen... Take 1  tablet by mouth once a day for blood pressure  Orders: UA Dipstick w/o Micro (manual) (16109)  Problem # 2:  ALCOHOLISM (ICD-303.90)  still drinking 2 fifths of vodka per day  Orders: Psychology Referral (Psychology)  Problem # 3:  DEPRESSION (ICD-311)  as expected PHQ9=24 today no suicidal thoughts do not think starting SSRI is a good idea with her drinking she is requesting lorazepam . . . Marland Kitchenexplained this is alos not a good idea with active drinking will have her see A. Ananias Pilgrim spoke to Edward Jolly today as well who met the patient  Orders: Psychology Referral (Psychology)  Problem # 4:  ABSCESS, TOOTH (ICD-522.5)  start Amox start Ibuprofen (no narc's with alcoholism) refer to dental clinic  Orders: Dental Referral (Dentist)  Complete Medication List: 1)  Famotidine 20 Mg Tabs (Famotidine) .... Take 1 tablet by mouth two times a day 2)  Tramadol Hcl 50 Mg Tabs (Tramadol hcl) .... Take one to two tab by mouth 6 hours as needed for pain 3)  Amlodipine Besylate 5 Mg Tabs (Amlodipine besylate) .... Take 1 tablet by mouth once a day for blood pressure 4)  Amoxicillin  500 Mg Caps (Amoxicillin) .... Take 1 capsule by mouth three times a day 5)  Ibuprofen 800 Mg Tabs (Ibuprofen) .... Take 1 tablet by mouth three times a day with food as needed for pain  Patient Instructions: 1)  Please schedule a follow-up appointment in 1 month with Tynleigh Birt for blood pressure. 2)  Schedule appointment with Ethelene Browns. 3)  Please get your medicines and start them. 4)  Someone will call you to set up the dental referral. 5)    Prescriptions: IBUPROFEN 800 MG TABS (IBUPROFEN) Take 1 tablet by mouth three times a day with food as needed for pain  #60 x 0   Entered and Authorized by:   Tereso Newcomer PA-C   Signed by:   Tereso Newcomer PA-C on 08/20/2009   Method used:   Print then Give to Patient   RxID:   [id number] AMOXICILLIN 500 MG CAPS (AMOXICILLIN) Take 1 capsule by mouth three  times a day  #21 x 0   Entered and Authorized by:   Tereso Newcomer PA-C   Signed by:   Tereso Newcomer PA-C on 08/20/2009   Method used:   Print then Give to Patient   RxID:   [id number]   Laboratory Results   Urine Tests    Routine Urinalysis   Glucose: negative   (Normal Range: Negative) Bilirubin: negative   (Normal Range: Negative) Ketone: negative   (Normal Range: Negative) Spec. Gravity: >=1.030   (Normal Range: 1.003-1.035) Blood: negative   (Normal Range: Negative) pH: 5.5   (Normal Range: 5.0-8.0) Protein: 30   (Normal Range: Negative) Urobilinogen: 0.2   (Normal Range: 0-1) Nitrite: negative   (Normal Range: Negative) Leukocyte Esterace: negative   (Normal Range: Negative)

## 2010-05-11 NOTE — Assessment & Plan Note (Signed)
Summary: REPEAT LABWORK PER Lavonne Kinderman///RJE  Nurse Visit    Allergies: No Known Drug Allergies  Orders Added: 1)  Est. Patient Level I [[address]109] 2)  T- * Misc. Laboratory test [phone]

## 2010-05-11 NOTE — Letter (Signed)
Summary: *HSN Results Follow up  [facility]  [address] [place], [place] [address]   Phone: [phone]  Fax: [phone]      12/10/2009   Kendra Griffith [address] [place], [place]  [address]   Dear  Ms. [name],                            ____S.Drinkard,FNP   ____D. Gore,FNP       ____B. McPherson,MD   ____V. Rankins,MD    ____E. Mulberry,MD    ____N. Daphine Deutscher, FNP  ____D. Reche Dixon, MD    ____K. Philipp Deputy, MD    __x__S. Alben Spittle, PA-C     This letter is to inform you that your recent test(s):  ___x____Pap Smear    _______Lab Test     _______X-ray    ____x___ is normal  _______ requires a medication change  _______ requires a follow-up lab visit  _______ requires a follow-up visit with your provider   Comments:       _________________________________________________________ If you have any questions, please contact our office                     Sincerely,  Tereso Newcomer PA-C [facility]

## 2010-05-11 NOTE — Progress Notes (Signed)
Summary: Colonoscopy RX   Phone Note Outgoing Call   Summary of Call: pt have an appt with Dr Victorino Dike 01-20-10 @ 1pm and she will pass by in October to pick up a prescription & instructions  Booking # [phone]  Thank you  Initial call taken by: Cheryll Dessert,  December 10, 2009 11:51 AM  Follow-up for Phone Call        ok thanks.... Kendra Griffith need mirlax script Follow-up by: Armenia Shannon,  December 10, 2009 3:47 PM

## 2010-05-11 NOTE — Letter (Signed)
Summary: *HSN Results Follow up  [facility]  [address] [place], [place] [address]   Phone: [phone]  Fax: [phone]      01/04/2010   NITYA CAUTHON [address] [place], [place]  [address]   Dear  Ms. Shawnelle Klinker,                            ____S.Drinkard,FNP   ____D. Gore,FNP       ____B. McPherson,MD   ____V. Rankins,MD    ____E. Mulberry,MD    ____N. Daphine Deutscher, FNP  ____D. Reche Dixon, MD    ____K. Philipp Deputy, MD    ____Other     This letter is to inform you that your recent test(s):  _______Pap Smear    _______Lab Test     _______X-ray    _______ is within acceptable limits  _______ requires a medication change  _______ requires a follow-up lab visit  _______ requires a follow-up visit with your provider   Comments:  We have been trying to reach you.  Please give the office a call at your earliest convenience.       _________________________________________________________ If you have any questions, please contact our office                     Sincerely,  Armenia Shannon [facility]

## 2010-05-13 NOTE — Letter (Signed)
Summary: *HSN Results Follow up  [facility]  [address] [place], [place] [address]   Phone: [phone]  Fax: [phone]      04/02/2010   IVANNAH ZODY [address] [place], [place]  [address]   Dear  Ms. Shirely Albee,                           Comments: I would like to know why you nos to an appt [facility] for you GI 04-01-10@ 7:00am . Please, call me at your earliest convinience Thank you and have a safety and happy holidays .       _________________________________________________________ If you have any questions, please contact our office                     Sincerely,  Cheryll Dessert [facility]

## 2010-05-14 NOTE — Letter (Signed)
Summary: Discharge Summary  Discharge Summary   Imported By: Arta Bruce 08/04/2009 10:41:46  _____________________________________________________________________  External Attachment:    Type:   Image     Comment:   External Document

## 2010-05-25 ENCOUNTER — Telehealth (INDEPENDENT_AMBULATORY_CARE_PROVIDER_SITE_OTHER): Payer: Self-pay | Admitting: Internal Medicine

## 2010-06-01 ENCOUNTER — Other Ambulatory Visit: Payer: Self-pay

## 2010-06-01 DIAGNOSIS — Z1231 Encounter for screening mammogram for malignant neoplasm of breast: Secondary | ICD-10-CM

## 2010-06-08 NOTE — Progress Notes (Signed)
  Phone Note Call from Patient   Summary of Call: called pt to let her know about her no shows Initial call taken by: Armenia Shannon,  May 25, 2010 8:52 AM  Follow-up for Phone Call        ms Lucken returned [place] call from last week and I talked to her about she has 5 noshows and if she gets one more, she would then be discharged Follow-up by: Leodis Rains,  May 31, 2010 11:34 AM

## 2010-06-16 ENCOUNTER — Encounter (INDEPENDENT_AMBULATORY_CARE_PROVIDER_SITE_OTHER): Payer: Self-pay | Admitting: Internal Medicine

## 2010-06-16 ENCOUNTER — Ambulatory Visit (HOSPITAL_COMMUNITY): Payer: Self-pay

## 2010-06-16 ENCOUNTER — Encounter: Payer: Self-pay | Admitting: Internal Medicine

## 2010-06-16 DIAGNOSIS — M79609 Pain in unspecified limb: Secondary | ICD-10-CM | POA: Insufficient documentation

## 2010-06-22 ENCOUNTER — Ambulatory Visit (HOSPITAL_COMMUNITY): Payer: Self-pay

## 2010-06-22 NOTE — Assessment & Plan Note (Signed)
Summary: Dental issues and both feet hurting   Vital Signs:  Patient profile:   45 year old female Menstrual status:  irregular LMP:     05/30/2010 Weight:      132.56 pounds Temp:     98.5 degrees F oral Pulse rate:   101 / minute Pulse rhythm:   regular Resp:     22 per minute BP sitting:   141 / 91  (left arm) Cuff size:   regular  Vitals Entered By: Hale Drone CMA (June 16, 2010 11:01 AM) CC: Having dental pain and needing some type of pain med. Has already been to the [facility] and saw Dr. Manson Passey. They are unable to help her until she stops drinking. Has not been taking any meds for the pain since they told her she can not take Tylenol since she drinks heavy. Also having pain on both feet x9 years. Has had 2 surgeries and the pain is constantly.  Is Patient Diabetic? No Pain Assessment Patient in pain? yes       Does patient need assistance? Functional Status Self care Ambulation Normal LMP (date): 05/30/2010     Menstrual Status irregular Enter LMP: 05/30/2010 Last PAP Result NEGATIVE FOR INTRAEPITHELIAL LESIONS OR MALIGNANCY.   Primary Care Provider:  Tereso Newcomer, PA-C  CC:  Having dental pain and needing some type of pain med. Has already been to the [facility] and saw Dr. Manson Passey. They are unable to help her until she stops drinking. Has not been taking any meds for the pain since they told her she can not take Tylenol since she drinks heavy. Also having pain on both feet x9 years. Has had 2 surgeries and the pain is constantly. Marland Kitchen  History of Present Illness: 1.  Dental pain:  [facility] by a Dr. Manson Passey.  Reportedly needs to have teeth removed, but because of her alcohol use, he was reportedly unwilling to treat her.  Pt. cannot give me what he specifically mentioned as his concerns.  She is still having pain and having difficulties chewing.    2.  Alcohol Abuse:  Vodka:  Drinks maybe 2 fifths daily.  Has been an alcoholic since age 1.  Has had inpatient and  outpatient treatment.  [facility].  Did speak with Aquilla Solian at [facility] clinic previously, but pt. never counseled with her regularly.  Pt. went to [facility] instead, but she did not have insurance for detox. Pt. states tried 3 other places and was told same thing--no insurance and they could not help.  Pt. not willing today to see a counselor--states her ride does not want to wait.  3.  Tobacco Abuse:  smokes 2ppd.  4.  Bilateral  Foot pain:  has had surgery on both feet.  States both feet hurt all the time.  Current Medications (verified): 1)  Famotidine 20 Mg Tabs (Famotidine) .... Take 1 Tablet By Mouth Two Times A Day 2)  Amlodipine Besylate 5 Mg Tabs (Amlodipine Besylate) .... Take 1 Tablet By Mouth Once A Day For Blood Pressure 3)  Ibuprofen 800 Mg Tabs (Ibuprofen) .... Take 1 Tablet By Mouth Three Times A Day With Food As Needed For Pain 4)  Multivitamins  Tabs (Multiple Vitamin) .... Take 1 Tablet By Mouth Once A Day 5)  Vitamin B-1 100 Mg Tabs (Thiamine Hcl) .... Take 1 Tablet By Mouth Once A Day 6)  Folic Acid 1 Mg Tabs (Folic Acid) .... Take 1 Tablet By  Mouth Once A Day 7)  Miralax  Powd (Polyethylene Glycol 3350) .... Dissolve 1 Capful in A Glass of Water and Drink One Glass Once Daily As Needed 8)  Tylenol With Codeine #3 300-30 Mg Tabs (Acetaminophen-Codeine) .Marland Kitchen.. 1 By Mouth No More Than Two Times A Day As Needed For Severe Pain  Allergies (verified): 1)  ! Vicodin 2)  ! Hydrocodone  Physical Exam  General:  Pt. states she is currently under the influence of alcohol. Mouth:  Terrible decay of most of molars with surrounding gingival erythema and swelling.  No fluctuance noted.  Most of molars broken, may at gumline with deep caries. Extremities:  Bilateral feet with heavy callous over plantar aspect and medial great toes  Toenails long and curved into tips of toes, particularly her 5th toes with long, curving nail, the left biting into soft tissue at tip of  toe. Callous formation over dorsal joints of all toes.   Impression & Recommendations:  Problem # 1:  FOOT PAIN, BILATERAL (ICD-729.5)  Send to podiatry clinic for regular care. Discussed with pt. she really needs to have her toenails clipped and to wear boxy toed shoes. She does not wear heels.  Orders: Podiatry Referral (Podiatry)  Problem # 2:  ALCOHOLISM (ICD-303.90) Stacey Allred  in to see pt. to set up follow up--he recommended SSRI to start.  Clydie Braun will see on the 16th to set up for detox and counseling.  They will call her in next few days.  Problem # 3:  ABSCESS, TOOTH (ICD-522.5) Pen VK 500 mg 4 times daily for 10 days. Discussed that no pain med is really good for her with her excessive drinking. All pain meds will have negative interactions with the alcohol Can try topical over the counter Anbesol  Problem # 4:  DEPRESSION (ICD-311) See under alcoholism Her updated medication list for this problem includes:    Fluoxetine Hcl 10 Mg Tabs (Fluoxetine hcl) .Marland Kitchen... 1 tab by mouth daily  Complete Medication List: 1)  Famotidine 20 Mg Tabs (Famotidine) .... Take 1 tablet by mouth two times a day 2)  Amlodipine Besylate 5 Mg Tabs (Amlodipine besylate) .... Take 1 tablet by mouth once a day for blood pressure 3)  Ibuprofen 800 Mg Tabs (Ibuprofen) .... Take 1 tablet by mouth three times a day with food as needed for pain 4)  Multivitamins Tabs (Multiple vitamin) .... Take 1 tablet by mouth once a day 5)  Vitamin B-1 100 Mg Tabs (Thiamine hcl) .... Take 1 tablet by mouth once a day 6)  Folic Acid 1 Mg Tabs (Folic acid) .... Take 1 tablet by mouth once a day 7)  Miralax Powd (Polyethylene glycol 3350) .... Dissolve 1 capful in a glass of water and drink one glass once daily as needed 8)  Penicillin V Potassium 500 Mg Tabs (Penicillin v potassium) .Marland Kitchen.. 1 tab by mouth 4 times daily for 10 days 9)  Fluoxetine Hcl 10 Mg Tabs (Fluoxetine hcl) .Marland Kitchen.. 1 tab by mouth daily  Patient  Instructions: 1)  Follow up with Dr. Delrae Alfred in 1 month --depression Prescriptions: FLUOXETINE HCL 10 MG TABS (FLUOXETINE HCL) 1 tab by mouth daily  #30 x 1   Entered and Authorized by:   Julieanne Manson MD   Signed by:   Julieanne Manson MD on 06/16/2010   Method used:   Electronically to        [facility] [id number]* (retail)       [address]  [place], [place]  [address]       Ph: [phone]       Fax: [phone]   RxID:   [id number] PENICILLIN V POTASSIUM 500 MG TABS (PENICILLIN V POTASSIUM) 1 tab by mouth 4 times daily for 10 days  #40 x 0   Entered and Authorized by:   Julieanne Manson MD   Signed by:   Julieanne Manson MD on 06/16/2010   Method used:   Faxed to ...       [facility] (retail)       [address] [place], [place]  [address]       Ph: [phone]       Fax: [phone]   RxID:   [id number]    Orders Added: 1)  Est. Patient Level IV [01601] 2)  Podiatry Referral [Podiatry]

## 2010-06-24 LAB — COMPREHENSIVE METABOLIC PANEL
ALT: 26 U/L (ref 0–35)
Albumin: 4.4 g/dL (ref 3.5–5.2)
Alkaline Phosphatase: 58 U/L (ref 39–117)
Calcium: 9.6 mg/dL (ref 8.4–10.5)
GFR calc Af Amer: >60 mL/min (ref >60)
Glucose, Bld: 81 mg/dL (ref 70–99)
Potassium: 3.6 mEq/L (ref 3.5–5.1)
Sodium: 139 mEq/L (ref 135–145)
Total Protein: 8 g/dL (ref 6.0–8.3)

## 2010-06-24 LAB — RAPID URINE DRUG SCREEN, HOSP PERFORMED
Barbiturates: NOT DETECTED
Benzodiazepines: NOT DETECTED
Cocaine: NOT DETECTED
Tetrahydrocannabinol: NOT DETECTED

## 2010-06-24 LAB — ETHANOL: Alcohol, Ethyl (B): <5 mg/dL (ref 0–10)

## 2010-06-24 LAB — CBC
HCT: 33.2 % — ABNORMAL LOW (ref 36.0–46.0)
Hemoglobin: 11.2 g/dL — ABNORMAL LOW (ref 12.0–15.0)
RBC: 3.84 MIL/uL — ABNORMAL LOW (ref 3.87–5.11)
WBC: 6.1 10*3/uL (ref 4.0–10.5)

## 2010-06-25 LAB — LIPID PANEL
Cholesterol: 188 mg/dL (ref 0–200)
LDL Cholesterol: 66 mg/dL (ref 0–99)
Total CHOL/HDL Ratio: 1.6 RATIO

## 2010-06-25 LAB — DIFFERENTIAL
Blasts: 0 %
Eosinophils Relative: 4 % (ref 0–5)
Lymphocytes Relative: 41 % (ref 12–46)
Lymphs Abs: 1.1 10*3/uL (ref 0.7–4.0)
Lymphs Abs: 3 10*3/uL (ref 0.7–4.0)
Monocytes Absolute: 0.2 10*3/uL (ref 0.1–1.0)
Myelocytes: 0 %
Neutro Abs: 3 10*3/uL (ref 1.7–7.7)
Neutrophils Relative %: 41 % — ABNORMAL LOW (ref 43–77)
Promyelocytes Absolute: 0 %

## 2010-06-25 LAB — GLUCOSE, CAPILLARY: Glucose-Capillary: 131 mg/dL — ABNORMAL HIGH (ref 70–99)

## 2010-06-25 LAB — COMPREHENSIVE METABOLIC PANEL
BUN: 7 mg/dL (ref 6–23)
CO2: 22 mEq/L (ref 19–32)
Calcium: 10.1 mg/dL (ref 8.4–10.5)
Creatinine, Ser: 1.1 mg/dL (ref 0.4–1.2)
GFR calc non Af Amer: 54 mL/min — ABNORMAL LOW (ref >60)
Glucose, Bld: 115 mg/dL — ABNORMAL HIGH (ref 70–99)

## 2010-06-25 LAB — CBC
Hemoglobin: 11 g/dL — ABNORMAL LOW (ref 12.0–15.0)
MCH: 30.7 pg (ref 26.0–34.0)
MCH: 30.8 pg (ref 26.0–34.0)
MCHC: 32.8 g/dL (ref 30.0–36.0)
MCHC: 33.1 g/dL (ref 30.0–36.0)
Platelets: 118 10*3/uL — ABNORMAL LOW (ref 150–400)
RBC: 3.73 MIL/uL — ABNORMAL LOW (ref 3.87–5.11)
RDW: 19.9 % — ABNORMAL HIGH (ref 11.5–15.5)

## 2010-06-25 LAB — CARDIAC PANEL(CRET KIN+CKTOT+MB+TROPI)
CK, MB: 1.8 ng/mL (ref 0.3–4.0)
CK, MB: 2 ng/mL (ref 0.3–4.0)
CK, MB: 2.3 ng/mL (ref 0.3–4.0)
Relative Index: 1.5 (ref 0.0–2.5)
Total CK: 118 U/L (ref 7–177)
Total CK: 136 U/L (ref 7–177)
Total CK: 150 U/L (ref 7–177)
Troponin I: 0.02 ng/mL (ref 0.00–0.06)

## 2010-06-25 LAB — RAPID URINE DRUG SCREEN, HOSP PERFORMED
Amphetamines: NOT DETECTED
Amphetamines: NOT DETECTED
Barbiturates: NOT DETECTED
Benzodiazepines: NOT DETECTED
Opiates: NOT DETECTED

## 2010-06-25 LAB — TSH: TSH: 1.327 u[IU]/mL (ref 0.350–4.500)

## 2010-06-25 LAB — BASIC METABOLIC PANEL
BUN: 8 mg/dL (ref 6–23)
CO2: 18 mEq/L — ABNORMAL LOW (ref 19–32)
Calcium: 8.7 mg/dL (ref 8.4–10.5)
Creatinine, Ser: 1.01 mg/dL (ref 0.4–1.2)
Glucose, Bld: 64 mg/dL — ABNORMAL LOW (ref 70–99)
Sodium: 140 mEq/L (ref 135–145)

## 2010-06-25 LAB — CK TOTAL AND CKMB (NOT AT ARMC): Total CK: 201 U/L — ABNORMAL HIGH (ref 7–177)

## 2010-06-25 LAB — D-DIMER, QUANTITATIVE: D-Dimer, Quant: 2.94 ug/mL-FEU — ABNORMAL HIGH (ref 0.00–0.48)

## 2010-06-25 LAB — TROPONIN I: Troponin I: 0.01 ng/mL (ref 0.00–0.06)

## 2010-06-27 LAB — RAPID URINE DRUG SCREEN, HOSP PERFORMED
Barbiturates: NOT DETECTED
Cocaine: NOT DETECTED
Opiates: POSITIVE — AB
Tetrahydrocannabinol: NOT DETECTED

## 2010-06-27 LAB — COMPREHENSIVE METABOLIC PANEL
ALT: 24 U/L (ref 0–35)
Alkaline Phosphatase: 66 U/L (ref 39–117)
BUN: 4 mg/dL — ABNORMAL LOW (ref 6–23)
CO2: 24 mEq/L (ref 19–32)
Chloride: 99 mEq/L (ref 96–112)
Glucose, Bld: 113 mg/dL — ABNORMAL HIGH (ref 70–99)
Potassium: 3.4 mEq/L — ABNORMAL LOW (ref 3.5–5.1)
Sodium: 141 mEq/L (ref 135–145)
Total Bilirubin: 1.2 mg/dL (ref 0.3–1.2)
Total Protein: 7.7 g/dL (ref 6.0–8.3)

## 2010-06-27 LAB — URINALYSIS, ROUTINE W REFLEX MICROSCOPIC
Bilirubin Urine: NEGATIVE
Hgb urine dipstick: NEGATIVE
Ketones, ur: 40 mg/dL — AB
Nitrite: NEGATIVE
Urobilinogen, UA: 1 mg/dL (ref 0.0–1.0)
pH: 8 (ref 5.0–8.0)

## 2010-06-27 LAB — DIFFERENTIAL
Basophils Absolute: 0.1 10*3/uL (ref 0.0–0.1)
Basophils Relative: 1 % (ref 0–1)
Blasts: 0 %
Lymphocytes Relative: 15 % (ref 12–46)
Lymphs Abs: 0.8 10*3/uL (ref 0.7–4.0)
Myelocytes: 0 %
Neutro Abs: 3.9 10*3/uL (ref 1.7–7.7)
Neutrophils Relative %: 76 % (ref 43–77)
Promyelocytes Absolute: 0 %

## 2010-06-27 LAB — URINE MICROSCOPIC-ADD ON

## 2010-06-27 LAB — LIPASE, BLOOD: Lipase: 19 U/L (ref 11–59)

## 2010-06-27 LAB — POCT PREGNANCY, URINE: Preg Test, Ur: NEGATIVE

## 2010-06-27 LAB — CBC
HCT: 35 % — ABNORMAL LOW (ref 36.0–46.0)
Hemoglobin: 11.6 g/dL — ABNORMAL LOW (ref 12.0–15.0)
RBC: 3.52 MIL/uL — ABNORMAL LOW (ref 3.87–5.11)
WBC: 5.2 10*3/uL (ref 4.0–10.5)

## 2010-06-27 LAB — ETHANOL: Alcohol, Ethyl (B): <5 mg/dL (ref 0–10)

## 2010-06-28 LAB — COMPREHENSIVE METABOLIC PANEL
ALT: 9 U/L (ref 0–35)
AST: 23 U/L (ref 0–37)
Albumin: 4.1 g/dL (ref 3.5–5.2)
Alkaline Phosphatase: 67 U/L (ref 39–117)
Calcium: 9 mg/dL (ref 8.4–10.5)
GFR calc Af Amer: >60 mL/min (ref >60)
Glucose, Bld: 153 mg/dL — ABNORMAL HIGH (ref 70–99)
Potassium: 3.3 mEq/L — ABNORMAL LOW (ref 3.5–5.1)
Sodium: 134 mEq/L — ABNORMAL LOW (ref 135–145)
Total Protein: 7.4 g/dL (ref 6.0–8.3)

## 2010-06-28 LAB — DIFFERENTIAL
Basophils Relative: 1 % (ref 0–1)
Eosinophils Absolute: 0.1 10*3/uL (ref 0.0–0.7)
Eosinophils Relative: 2 % (ref 0–5)
Lymphocytes Relative: 40 % (ref 12–46)
Lymphs Abs: 1.7 10*3/uL (ref 0.7–4.0)
Monocytes Absolute: 0.4 10*3/uL (ref 0.1–1.0)
Monocytes Relative: 9 % (ref 3–12)

## 2010-06-28 LAB — URINE MICROSCOPIC-ADD ON

## 2010-06-28 LAB — RAPID URINE DRUG SCREEN, HOSP PERFORMED
Amphetamines: NOT DETECTED
Barbiturates: NOT DETECTED
Tetrahydrocannabinol: NOT DETECTED

## 2010-06-28 LAB — URINALYSIS, ROUTINE W REFLEX MICROSCOPIC
Bilirubin Urine: NEGATIVE
Glucose, UA: NEGATIVE mg/dL
Ketones, ur: 15 mg/dL — AB
Nitrite: NEGATIVE
Specific Gravity, Urine: 1.018 (ref 1.005–1.030)
pH: 6 (ref 5.0–8.0)

## 2010-06-28 LAB — CBC
Hemoglobin: 11.6 g/dL — ABNORMAL LOW (ref 12.0–15.0)
MCHC: 34 g/dL (ref 30.0–36.0)
RDW: 15.6 % — ABNORMAL HIGH (ref 11.5–15.5)

## 2010-06-28 LAB — GC/CHLAMYDIA PROBE AMP, GENITAL: Chlamydia, DNA Probe: NEGATIVE

## 2010-06-28 LAB — WET PREP, GENITAL
Clue Cells Wet Prep HPF POC: NONE SEEN
Trich, Wet Prep: NONE SEEN
WBC, Wet Prep HPF POC: NONE SEEN

## 2010-06-28 LAB — POCT PREGNANCY, URINE: Preg Test, Ur: NEGATIVE

## 2010-06-30 LAB — CBC
Hemoglobin: 10.5 g/dL — ABNORMAL LOW (ref 12.0–15.0)
RBC: 3.28 MIL/uL — ABNORMAL LOW (ref 3.87–5.11)
WBC: 4.2 10*3/uL (ref 4.0–10.5)

## 2010-06-30 LAB — HEPATIC FUNCTION PANEL
ALT: 15 U/L (ref 0–35)
Alkaline Phosphatase: 80 U/L (ref 39–117)
Bilirubin, Direct: 0.1 mg/dL (ref 0.0–0.3)
Indirect Bilirubin: 0.3 mg/dL (ref 0.3–0.9)

## 2010-06-30 LAB — BASIC METABOLIC PANEL
Calcium: 9.5 mg/dL (ref 8.4–10.5)
GFR calc Af Amer: >60 mL/min (ref >60)
GFR calc non Af Amer: >60 mL/min (ref >60)
Sodium: 139 mEq/L (ref 135–145)

## 2010-06-30 LAB — MAGNESIUM: Magnesium: 1.7 mg/dL (ref 1.5–2.5)

## 2010-06-30 LAB — DIFFERENTIAL
Basophils Absolute: 0 10*3/uL (ref 0.0–0.1)
Basophils Relative: 0 % (ref 0–1)
Eosinophils Relative: 0 % (ref 0–5)
Lymphocytes Relative: 22 % (ref 12–46)
Monocytes Relative: 5 % (ref 3–12)
Neutro Abs: 3.1 10*3/uL (ref 1.7–7.7)

## 2010-06-30 LAB — RAPID URINE DRUG SCREEN, HOSP PERFORMED
Amphetamines: NOT DETECTED
Tetrahydrocannabinol: NOT DETECTED

## 2010-07-02 LAB — POCT I-STAT, CHEM 8
BUN: 5 mg/dL — ABNORMAL LOW (ref 6–23)
Chloride: 106 mEq/L (ref 96–112)
Sodium: 140 mEq/L (ref 135–145)
TCO2: 29 mmol/L (ref 0–100)

## 2010-07-02 LAB — ETHANOL: Alcohol, Ethyl (B): <5 mg/dL (ref 0–10)

## 2010-07-02 LAB — RAPID URINE DRUG SCREEN, HOSP PERFORMED
Cocaine: NOT DETECTED
Tetrahydrocannabinol: NOT DETECTED

## 2010-07-18 LAB — URINALYSIS, ROUTINE W REFLEX MICROSCOPIC
Bilirubin Urine: NEGATIVE
Ketones, ur: 15 mg/dL — AB
Nitrite: NEGATIVE
Specific Gravity, Urine: 1.019 (ref 1.005–1.030)
Urobilinogen, UA: 1 mg/dL (ref 0.0–1.0)

## 2010-07-18 LAB — DIFFERENTIAL
Basophils Absolute: 0 10*3/uL (ref 0.0–0.1)
Basophils Relative: 0 % (ref 0–1)
Lymphocytes Relative: 16 % (ref 12–46)
Monocytes Relative: 3 % (ref 3–12)
Neutro Abs: 4.6 10*3/uL (ref 1.7–7.7)
Neutrophils Relative %: 81 % — ABNORMAL HIGH (ref 43–77)

## 2010-07-18 LAB — HEMOCCULT GUIAC POC 1CARD (OFFICE): Fecal Occult Bld: NEGATIVE

## 2010-07-18 LAB — COMPREHENSIVE METABOLIC PANEL
ALT: 61 U/L — ABNORMAL HIGH (ref 0–35)
Albumin: 4.3 g/dL (ref 3.5–5.2)
Alkaline Phosphatase: 76 U/L (ref 39–117)
GFR calc Af Amer: >60 mL/min (ref >60)
Potassium: 3.8 mEq/L (ref 3.5–5.1)
Sodium: 139 mEq/L (ref 135–145)
Total Protein: 7.1 g/dL (ref 6.0–8.3)

## 2010-07-18 LAB — ETHANOL: Alcohol, Ethyl (B): <5 mg/dL (ref 0–10)

## 2010-07-18 LAB — RAPID URINE DRUG SCREEN, HOSP PERFORMED
Amphetamines: NOT DETECTED
Benzodiazepines: NOT DETECTED
Cocaine: NOT DETECTED
Tetrahydrocannabinol: NOT DETECTED

## 2010-07-18 LAB — CBC
Platelets: 84 10*3/uL — ABNORMAL LOW (ref 150–400)
RDW: 18.7 % — ABNORMAL HIGH (ref 11.5–15.5)

## 2010-07-18 LAB — PREGNANCY, URINE: Preg Test, Ur: NEGATIVE

## 2010-07-18 LAB — URINE MICROSCOPIC-ADD ON

## 2010-07-19 ENCOUNTER — Emergency Department (HOSPITAL_COMMUNITY)
Admission: EM | Admit: 2010-07-19 | Discharge: 2010-07-20 | Discharge status: Against Medical Advice | Payer: Self-pay | Attending: Emergency Medicine | Admitting: Emergency Medicine

## 2010-07-19 DIAGNOSIS — Z0389 Encounter for observation for other suspected diseases and conditions ruled out: Secondary | ICD-10-CM | POA: Insufficient documentation

## 2010-07-20 ENCOUNTER — Ambulatory Visit (HOSPITAL_COMMUNITY): Payer: Self-pay | Attending: Family Medicine

## 2010-07-20 DIAGNOSIS — Z139 Encounter for screening, unspecified: Secondary | ICD-10-CM | POA: Insufficient documentation

## 2010-08-27 NOTE — Group Therapy Note (Signed)
NAMECEILI, BOSHERS NO.:  [id number]   MEDICAL RECORD NO.:  [id number]          PATIENT TYPE:  WOC   LOCATION:  [facility]                   FACILITY:  [facility]   PHYSICIAN:  Tinnie Gens, MD        DATE OF BIRTH:  October 19, 1965   DATE OF SERVICE:                                    CLINIC NOTE   CHIEF COMPLAINT:  Yearly exam, and heavy bleeding.   HISTORY OF PRESENT ILLNESS:  The patient is a 45 year old gravida 4, para 3-  0-1-3 who reports heavy bleeding for the last 6 months.  Her last Pap was in  July of last year, and it was normal.  The patient reports being admitted to  [facility] at some point in the recent past and finding a breast  mass.  She states that she has had some x-rays done, but she does not think  she had a mammogram, and review of her chart only reveals a chest x-ray.   PAST MEDICAL HISTORY:  Significant for hypertension and mental illness.   PAST SURGICAL HISTORY:  C section x3, tubal ligation.   MEDICATIONS:  HCTZ 25 mg daily.   ALLERGIES:  None.   OBSTETRICAL HISTORY:  G4, P3-0-1-3, 3 C sections.   GYNECOLOGICAL HISTORY:  Menarche at age 67.  Cycles are monthly, but will  have increasing length and quantity of flow.  The patient reports this has  been going on for the last year.   FAMILY HISTORY:  Diabetes, coronary artery disease, hypertension.   SOCIAL HISTORY:  The patient does smoke 2 packs per day for the last 20  years.  The patient does have a history of alcohol use, at least 3 drinks  per day and 10 or more per week.   REVIEW OF SYSTEMS:  A 14-point review of systems is reviewed, and is  positive for some swollen glands at the corners of her ears.  She wonders if  this is related to a loud noise she heard.  She has some shooting pains in  her neck, some chest pains with difficulty taking a deep breath at times.  There is the breast mass as referred to in the HPI, and her bleeding is also  described in the HPI and GYN  history.   PHYSICAL EXAMINATION:  VITAL SIGNS:  Her vital signs are as noted on the  chart.  GENERAL:  She is a well-developed, well-nourished, black female in no acute  distress.  She does have some seborrhea noted in the scalp and acne about  the face which probably explains the swollen lymph glands.  She has no  supraclavicular or axillary adenopathy.  BREASTS:  Symmetric with everted nipples.  She does have some dense  fibrocystic changes at the 6 o'clock position bilaterally.  No other  distinctive masses were felt.  ABDOMEN:  Soft, nontender, and nondistended.  GU:  She has normal external female genitalia.  The vagina is pink and  rugated.  The cervix is without lesion and anterior.  Pap smear is obtained.   PROCEDURE:  The cervix is cleaned with Betadine  x2.  The anterior lip of the  cervix is grasped with a single-tooth tenaculum.  The uterus was sounded to  10 cm.  Endometrial biopsy is obtained without difficulty.  On bimanual, the  patient has a 10-week-size uterus that is anteverted and nontender.  There  is no adnexal mass or tenderness.   IMPRESSION:  1.  Annual exam.  2.  Heavy bleeding.  3.  Question of breast mass.   PLAN:  1.  Pap smear today.  2.  Endometrial biopsy today.  3.  TSH today.  4.  Schedule mammogram.  5.  Pelvic ultrasound.  6.  Follow up in 4-6 weeks for results of this.  7.  As far as her hypertension and elevated cholesterol, the patient needs      to have a primary care physician.  We will try to get her a referral      over to [facility].       TP/MEDQ  D:  11/04/2004  T:  11/04/2004  Job:  [id number]

## 2010-08-27 NOTE — Discharge Summary (Signed)
Kendra Griffith, Kendra Griffith               ACCOUNT NO.:  [id number]   MEDICAL RECORD NO.:  [id number]          PATIENT TYPE:  IPS   LOCATION:  0401                          [facility]ACILITY:  [facility]   PHYSICIAN:  Jeanice Lim, M.D. DATE OF BIRTH:  1965/10/14   DATE OF ADMISSION:  07/15/2004  DATE OF DISCHARGE:  07/21/2004                                 DISCHARGE SUMMARY   IDENTIFYING DATA:  A 44 year old African-American divorced female presenting  with auditory and visual hallucinations and suicidal ideation.   MEDICATIONS:  Hydrochlorothiazide.   ALLERGIES:  No known drug allergies.   PHYSICAL EXAMINATION:  Physical and neurologic exam within normal limits.   MENTAL STATUS EXAM:  Significant findings, positive for suicidal ideation,  homicidal, psychotic symptoms, depressed, tearful, anxious, fidgety.  Cognition intact.  Judgment and insight poor with poor impulse control.   ADMITTING DIAGNOSES:   AXIS I:  Major depressive disorder with psychotic symptoms, rule out bipolar  2 due to agitation.  Alcohol abuse, rule out dependence and rule out  substance abuse disorder.   AXIS II:  Deferred.   AXIS III:  Hypertension, anorexia.   AXIS IV:  Moderate.   AXIS V:  20/55.   HOSPITAL COURSE:  The patient was admitted and ordered routine p.r.n.  medications and monitoring.  She was encouraged to participate in individual  and group milieu therapy.  The patient gradually stabilized with crisis  intervention and medication optimization.  The patient was discharged in  improved condition with a more stable mood and no dangerous ideations  including suicidal ideation, no psychotic symptoms, reporting resolution of  hallucinations and no dangerous ideation, showing improved judgment and  insight.  The patient was given medication education.   DISCHARGE MEDICATIONS:  1.  __________ 500 q.h.s.  2.  Risperdal 1 mg  one-half in the morning and two at night.  3.  Lexapro 10 mg daily.  4.   Temazepam 15 mg q.h.s.  5.  Desyrel 100 mg 1-1/2 q.h.s.  6.  Hydrochlorothiazide 1 daily.  7.  Motrin p.r.n. as directed.   The patient was to follow up with Dr. Rodman Pickle for psychiatric medication  management.   DISCHARGE DIAGNOSES:   AXIS I:  Major depressive disorder with psychotic symptoms, rule out bipolar  2 due to agitation.  Alcohol abuse, rule out dependence and rule out  substance abuse disorder.   AXIS II:  Deferred.   AXIS III:  Hypertension, anorexia.   AXIS IV:  Moderate.   AXIS V:  On discharge 55, improved condition.      JEM/MEDQ  D:  08/19/2004  T:  08/20/2004  Job:  [id number]

## 2010-08-27 NOTE — Group Therapy Note (Signed)
NAME:  Kendra Griffith, Kendra Griffith                         ACCOUNT NO.:  [id number]   MEDICAL RECORD NO.:  [id number]                   PATIENT TYPE:  OUT   LOCATION:  [facility]                           FACILITY:  [facility]   PHYSICIAN:  Argentina Donovan, MD                     DATE OF BIRTH:  1965/10/18   DATE OF SERVICE:  11/04/2003                                    CLINIC NOTE   CHIEF COMPLAINT:  Annual exam.   SUBJECTIVE:  Kendra Griffith is here today for her annual Pap smear.  She  currently has no complaints of discharge or pruritus.  She states her  periods have been normal and anywhere from 3-5 days in length every month.  She did have some irregular bleeding in January and February which is now  resolved.  She attributes this to new stress at that time from her oldest  child.  She is status post BTL and has had a new sexual partner in the last  6 months.   She does complain of some breast pain bilaterally.  She did not notice any  discharge or masses.  She has had no fevers or weight loss.   Moles:  She is concerned about some moles on her upper back and on her  bottom.  She does say her sister had a mole removed.  She is a light-skinned  sister.   REVIEW OF SYSTEMS:  She has had some hot flashes.  No chest pain or  shortness of breath.   FAMILY HISTORY:  No history of gynecological cancer.  She does have some  coronary artery disease and diabetes on the mother's side.   OBSTETRICAL HISTORY:  She is a G4 P3-0-1-3.  She has had all C-sections  secondary to breech presentation.  Her last mammogram was in 2002 and was  normal.  She never had any abnormal Pap smears.   PAST MEDICAL HISTORY:  Hypertension for which she is on Toprol-XL but cannot  afford it regularly.   OBJECTIVE:  VITAL SIGNS:  Noted.  GENERAL:  She is a well-appearing female in no acute distress.  BREAST:  Reveals no skin changes, no nipple discharge, no masses or  lymphadenopathy.  PELVIC:  Bimanual exam reveals a  nonpregnant uterus with nonpalpable adnexa  bilaterally.  Normal external female genitalia.  Normal urethra and vaginal  wall.  Her cervix appears within normal limits.  SKIN:  Benign.   ASSESSMENT AND PLAN:  1. Annual examination.  Her Pap smear has been done today.  We sent that for     gonorrhea and chlamydia and also checked an HIV per the patient's request     due to her multiple sexual partners.  2. Hypertension.  I have checked a CMP today and also written her for     hydrochlorothiazide 25 daily which should be cheaper for her than Toprol-     XL and  maybe she will be more compliant with that.  3. Nevi.  She should have these checked on an annual basis.   She is to return in 1 year for follow-up.  Will call her with any abnormal  results.     Jon Gills, MD                       Argentina Donovan, MD    LC/MEDQ  D:  11/04/2003  T:  11/04/2003  Job:  [id number]

## 2010-09-30 ENCOUNTER — Other Ambulatory Visit: Payer: Self-pay | Admitting: Physician Assistant

## 2010-10-27 ENCOUNTER — Emergency Department (HOSPITAL_COMMUNITY)
Admission: EM | Admit: 2010-10-27 | Discharge: 2010-10-27 | Disposition: A | Discharge status: Home / Self Care | Payer: Self-pay | Attending: Emergency Medicine | Admitting: Emergency Medicine

## 2010-10-27 DIAGNOSIS — F3289 Other specified depressive episodes: Secondary | ICD-10-CM | POA: Insufficient documentation

## 2010-10-27 DIAGNOSIS — F329 Major depressive disorder, single episode, unspecified: Secondary | ICD-10-CM | POA: Insufficient documentation

## 2010-10-27 DIAGNOSIS — D573 Sickle-cell trait: Secondary | ICD-10-CM | POA: Insufficient documentation

## 2010-10-27 DIAGNOSIS — F1020 Alcohol dependence, uncomplicated: Secondary | ICD-10-CM | POA: Insufficient documentation

## 2010-10-27 DIAGNOSIS — I10 Essential (primary) hypertension: Secondary | ICD-10-CM | POA: Insufficient documentation

## 2010-10-27 LAB — CBC
Hemoglobin: 10.6 g/dL — ABNORMAL LOW (ref 12.0–15.0)
MCH: 28.5 pg (ref 26.0–34.0)
MCV: 86.6 fL (ref 78.0–100.0)
Platelets: 164 10*3/uL (ref 150–400)
RBC: 3.72 MIL/uL — ABNORMAL LOW (ref 3.87–5.11)
RDW: 18.3 % — ABNORMAL HIGH (ref 11.5–15.5)

## 2010-10-27 LAB — URINALYSIS, ROUTINE W REFLEX MICROSCOPIC
Ketones, ur: 15 mg/dL — AB
Nitrite: NEGATIVE
Protein, ur: >300 mg/dL — AB
Urobilinogen, UA: 1 mg/dL (ref 0.0–1.0)

## 2010-10-27 LAB — URINE MICROSCOPIC-ADD ON

## 2010-10-27 LAB — POCT I-STAT, CHEM 8
BUN: 8 mg/dL (ref 6–23)
Creatinine, Ser: 1 mg/dL (ref 0.50–1.10)
Hemoglobin: 13.3 g/dL (ref 12.0–15.0)
Potassium: 3.6 mEq/L (ref 3.5–5.1)
Sodium: 141 mEq/L (ref 135–145)

## 2010-10-27 LAB — RAPID URINE DRUG SCREEN, HOSP PERFORMED
Amphetamines: NOT DETECTED
Benzodiazepines: NOT DETECTED
Cocaine: NOT DETECTED
Opiates: NOT DETECTED

## 2010-10-27 LAB — DIFFERENTIAL
Basophils Absolute: 0.1 10*3/uL (ref 0.0–0.1)
Lymphocytes Relative: 39 % (ref 12–46)
Monocytes Absolute: 0.3 10*3/uL (ref 0.1–1.0)
Neutro Abs: 2.2 10*3/uL (ref 1.7–7.7)
Neutrophils Relative %: 50 % (ref 43–77)

## 2010-10-27 LAB — GLUCOSE, CAPILLARY: Glucose-Capillary: 143 mg/dL — ABNORMAL HIGH (ref 70–99)
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
## 2011-05-20 ENCOUNTER — Encounter (HOSPITAL_COMMUNITY): Payer: Self-pay | Admitting: Emergency Medicine

## 2011-05-20 ENCOUNTER — Emergency Department (HOSPITAL_COMMUNITY)
Admission: EM | Admit: 2011-05-20 | Discharge: 2011-05-22 | Disposition: A | Discharge status: Home / Self Care | Payer: Self-pay | Attending: Emergency Medicine | Admitting: Emergency Medicine

## 2011-05-20 DIAGNOSIS — F411 Generalized anxiety disorder: Secondary | ICD-10-CM | POA: Insufficient documentation

## 2011-05-20 DIAGNOSIS — Z79899 Other long term (current) drug therapy: Secondary | ICD-10-CM | POA: Insufficient documentation

## 2011-05-20 DIAGNOSIS — F319 Bipolar disorder, unspecified: Secondary | ICD-10-CM | POA: Insufficient documentation

## 2011-05-20 DIAGNOSIS — F17200 Nicotine dependence, unspecified, uncomplicated: Secondary | ICD-10-CM | POA: Insufficient documentation

## 2011-05-20 DIAGNOSIS — F1010 Alcohol abuse, uncomplicated: Secondary | ICD-10-CM | POA: Insufficient documentation

## 2011-05-20 DIAGNOSIS — K5900 Constipation, unspecified: Secondary | ICD-10-CM | POA: Insufficient documentation

## 2011-05-20 HISTORY — DX: Depression, unspecified: F32.A

## 2011-05-20 HISTORY — DX: Major depressive disorder, single episode, unspecified: F32.9

## 2011-05-20 HISTORY — DX: Anxiety disorder, unspecified: F41.9

## 2011-05-20 LAB — COMPREHENSIVE METABOLIC PANEL
Alkaline Phosphatase: 67 U/L (ref 39–117)
BUN: 9 mg/dL (ref 6–23)
GFR calc Af Amer: >90 mL/min (ref >90)
Glucose, Bld: 95 mg/dL (ref 70–99)
Potassium: 3.3 mEq/L — ABNORMAL LOW (ref 3.5–5.1)
Total Protein: 8.1 g/dL (ref 6.0–8.3)

## 2011-05-20 LAB — RAPID URINE DRUG SCREEN, HOSP PERFORMED
Amphetamines: NOT DETECTED
Benzodiazepines: NOT DETECTED
Cocaine: NOT DETECTED

## 2011-05-20 LAB — CBC
HCT: 34.9 % — ABNORMAL LOW (ref 36.0–46.0)
Hemoglobin: 11.7 g/dL — ABNORMAL LOW (ref 12.0–15.0)
MCH: 28.2 pg (ref 26.0–34.0)
MCHC: 33.5 g/dL (ref 30.0–36.0)

## 2011-05-20 LAB — ETHANOL: Alcohol, Ethyl (B): 317 mg/dL — ABNORMAL HIGH (ref 0–11)

## 2011-05-20 MED ORDER — POTASSIUM CHLORIDE 20 MEQ/15ML (10%) PO LIQD
40.0000 meq | Freq: Once | ORAL | Status: AC
  Administered 2011-05-20: 40 meq via ORAL
  Filled 2011-05-20 (×2): qty 30

## 2011-05-20 MED ORDER — NICOTINE 21 MG/24HR TD PT24
21.0000 mg | MEDICATED_PATCH | Freq: Every day | TRANSDERMAL | Status: DC
  Administered 2011-05-20 – 2011-05-22 (×3): 21 mg via TRANSDERMAL
  Filled 2011-05-20 (×3): qty 1

## 2011-05-20 MED ORDER — POTASSIUM CHLORIDE CRYS ER 20 MEQ PO TBCR
40.0000 meq | EXTENDED_RELEASE_TABLET | Freq: Once | ORAL | Status: DC
  Filled 2011-05-20: qty 2

## 2011-05-20 MED ORDER — POTASSIUM CHLORIDE CRYS ER 20 MEQ PO TBCR
40.0000 meq | EXTENDED_RELEASE_TABLET | Freq: Once | ORAL | Status: AC
  Administered 2011-05-20: 40 meq via ORAL
  Filled 2011-05-20: qty 2

## 2011-05-20 MED ORDER — ONDANSETRON HCL 4 MG PO TABS: 4.0000 mg | ORAL_TABLET | Freq: Three times a day (TID) | ORAL | Status: DC | PRN

## 2011-05-20 MED ORDER — IBUPROFEN 600 MG PO TABS
600.0000 mg | ORAL_TABLET | Freq: Three times a day (TID) | ORAL | Status: DC | PRN
  Administered 2011-05-20: 600 mg via ORAL
  Filled 2011-05-20: qty 1

## 2011-05-20 MED ORDER — LORAZEPAM 1 MG PO TABS
1.0000 mg | ORAL_TABLET | Freq: Three times a day (TID) | ORAL | Status: DC | PRN
  Administered 2011-05-20 – 2011-05-22 (×4): 1 mg via ORAL
  Filled 2011-05-20 (×4): qty 1

## 2011-05-20 MED ORDER — DOCUSATE SODIUM 100 MG PO CAPS
100.0000 mg | ORAL_CAPSULE | Freq: Once | ORAL | Status: AC
  Administered 2011-05-20: 100 mg via ORAL
  Filled 2011-05-20: qty 1

## 2011-05-20 NOTE — ED Provider Notes (Signed)
Pt seen and evaluated in psych ED.  Pt resting comfortably, [facility] has requested repeat labs which were obtained.  ETOH has improved.  Potassium is 3.3- oral repletion ordered.  [facility] has requested a repeat potassium after this has been given, then expect she will be accepted.    Ethelda Chick, MD 05/20/11 1050

## 2011-05-20 NOTE — ED Notes (Signed)
Pt ambulates to restroom to provide urine sample.

## 2011-05-20 NOTE — ED Provider Notes (Signed)
History     CSN: [id number]  Arrival date & time 05/20/11  0101   First MD Initiated Contact with Patient 05/20/11 0222      Chief Complaint  Patient presents with  . Alcohol Problem  . Medical Clearance    (Consider location/radiation/quality/duration/timing/severity/associated sxs/prior treatment) HPI Comments: Patient is a chronic alcoholic, who drinks 5 to does not take any street drugs.  She smokes tobacco, but no marijuana or cocaine.  Here requesting detox from alcohol.  She has been detoxed before the longer she has been without alcohol in her adult life.  For 6 months.  She also has bipolar disease, for which he takes medication intermittently.  She is not manic at this time.  She is not suicidal, homicidal  Patient is a 46 y.o. female presenting with alcohol problem. The history is provided by the patient.  Alcohol Problem This is a chronic problem. The current episode started more than 1 year ago. The problem occurs constantly. The problem has been unchanged. Pertinent negatives include no fever, headaches or weakness. The symptoms are aggravated by nothing. She has tried nothing for the symptoms.  Alcohol Problem This is a chronic problem. The current episode started more than 1 year ago. The problem occurs constantly. The problem has been unchanged. Pertinent negatives include no headaches. The symptoms are aggravated by nothing. She has tried nothing for the symptoms.    Past Medical History  Diagnosis Date  . Anxiety   . Depression     Past Surgical History  Procedure Date  . Cesarean section     No family history on file.  History  Substance Use Topics  . Smoking status: Passive Smoker -- 1.0 packs/day    Types: Cigarettes  . Smokeless tobacco: Not on file  . Alcohol Use: Yes    OB History    Grav Para Term Preterm Abortions TAB SAB Ect Mult Living                  Review of Systems  Constitutional: Negative for fever.  HENT: Negative for  rhinorrhea.   Gastrointestinal: Positive for constipation.  Genitourinary: Negative for vaginal discharge.  Skin: Negative for color change.  Neurological: Negative for dizziness, weakness and headaches.  Psychiatric/Behavioral: Negative for suicidal ideas, hallucinations, confusion, decreased concentration and agitation. The patient is not hyperactive.     Allergies  Hydrocodone and Hydrocodone-acetaminophen  Home Medications   Current Outpatient Rx  Name Route Sig Dispense Refill  . AMLODIPINE BESYLATE 5 MG PO TABS  TAKE ONE TABLET BY MOUTH EVERY DAY FOR BLOOD PRESSURE 30 tablet 12    BP 131/94  Pulse 113  Temp(Src) 98.9 F (37.2 C) (Oral)  Resp 18  Wt 132 lb (59.875 kg)  SpO2 100%  LMP 04/21/2011  Physical Exam  Constitutional: She is oriented to person, place, and time.  Eyes: Pupils are equal, round, and reactive to light.  Neck: Normal range of motion.  Cardiovascular: Normal rate.   Pulmonary/Chest: Effort normal.  Neurological: She is alert and oriented to person, place, and time.  Skin: Skin is warm and dry.  Psychiatric: She has a normal mood and affect.    ED Course  Procedures (including critical care time)  Labs Reviewed  CBC - Abnormal; Notable for the following:    Hemoglobin 11.7 (*)    HCT 34.9 (*)    RDW 19.0 (*)    All other components within normal limits  COMPREHENSIVE METABOLIC PANEL - Abnormal; Notable for  the following:    Potassium 3.3 (*)    GFR calc non Af Amer 85 (*)    All other components within normal limits  ETHANOL - Abnormal; Notable for the following:    Alcohol, Ethyl (B) 317 (*)    All other components within normal limits  ETHANOL - Abnormal; Notable for the following:    Alcohol, Ethyl (B) 177 (*)    All other components within normal limits  URINE RAPID DRUG SCREEN (HOSP PERFORMED)  PREGNANCY, URINE  POTASSIUM  ETHANOL   No results found.   1. Alcohol abuse       MDM  Will have asked him evaluate the  patient for placement in a detox program            Arman Filter, NP 05/20/11 0235  Arman Filter, NP 05/21/11 2006  Arman Filter, NP 05/21/11 2006  Arman Filter, NP 05/23/11 0129  Medical screening examination/treatment/procedure(s) were performed by non-physician practitioner and as supervising physician I was immediately available for consultation/collaboration.   Sunnie Nielsen, MD 05/23/11 0400

## 2011-05-20 NOTE — ED Notes (Signed)
ALP bedside to eval pt 

## 2011-05-20 NOTE — ED Notes (Signed)
Kendra Griffith act at bs to assess pt
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
## 2011-05-20 NOTE — ED Notes (Signed)
Report called to Nicole RN

## 2011-05-20 NOTE — ED Notes (Signed)
Pt alert, presents via EMS, request detox from alcohol, +ETOH, last drink this evening PTA, pt states usually drinks two 1/5 day, denies SI/HI, resp even unlabored, skin pwd

## 2011-05-20 NOTE — ED Notes (Signed)
Security bedside to wand pt, search belongings 

## 2011-05-20 NOTE — ED Notes (Signed)
Pt out to the desk demanding that I call her sister and tell her that she was here. I called the number given no answer, pt then demanding that I call another number person who answered reported that I had the wrong number pt reports that it was frances potts I called the number again and informed Scarlette Calico that she was here and that she could make phone calls and receive phone calls at 9am.

## 2011-05-20 NOTE — ED Notes (Signed)
[id number]> Expected date:05/20/11<BR> Expected time:12:45 AM<BR> Means of arrival:Ambulance<BR> Comments:<BR> detox

## 2011-05-21 LAB — ETHANOL: Alcohol, Ethyl (B): <11 mg/dL (ref 0–11)

## 2011-05-21 NOTE — ED Provider Notes (Signed)
  Physical Exam  BP 157/102  Pulse 75  Temp(Src) 98.2 F (36.8 C) (Oral)  Resp 16  Wt 132 lb (59.875 kg)  SpO2 100%  LMP 04/21/2011  Physical Exam  ED Course  Procedures  MDM ACt requests repeat ETOH and urine preg      Harrold Donath R. Rubin Payor, MD 05/21/11 1007

## 2011-05-21 NOTE — ED Notes (Signed)
Per Lavone Orn is requesting vs x3 1hr apart

## 2011-05-21 NOTE — BHH Counselor (Signed)
Toniann Fail from [facility] reviewed labs, vital and BAL and is holding off acceptance until B/P drops to a more appropriate level per [facility].  ACT will f/u in 3 hrs if B/P is lower.

## 2011-05-22 NOTE — BHH Counselor (Signed)
Pt accepted to [facility] but does not want to go.  Pt reports she has family to see and the distance is too great for her to travel.  Pt was informed [facility] would come and pick her up and transport her to facility.  Pt declined.  Pt wants to go home.  ACT inquired about pt safety and pt reaffirmed she was just here for detox.  In reviewing assessment and notes it appears pt is seeking detox and not a risk to self or others.  Conferred with Dr. Rosalia Hammers and she was in agreement with dispo.  Pt offered follow up information but she reported she already had resources.  It should be noted pt is not consistent with her meds and has been off them for over 2 mths per pt.

## 2011-05-22 NOTE — ED Provider Notes (Addendum)
Patient here initially requested detox.  Kendra Griffith been here for over two days and was accepted to [facility].  Patient does now not want detox and wants to go.  She has no documentation or reports of si, hi or other contraindications to d/c.    [name], MD 05/22/11 1231  [name], MD 05/22/11 1515

## 2011-05-22 NOTE — ED Notes (Signed)
Pt walked out to the discharge window by the off duty officer.

## 2011-05-22 NOTE — ED Notes (Signed)
Pt discharging home per md orders. No prescriptions.

## 2011-10-06 ENCOUNTER — Encounter (HOSPITAL_COMMUNITY): Payer: Self-pay | Admitting: Emergency Medicine

## 2011-10-06 ENCOUNTER — Emergency Department (HOSPITAL_COMMUNITY)
Admission: EM | Admit: 2011-10-06 | Discharge: 2011-10-07 | Disposition: A | Discharge status: Other Acute Inpatient Hospital | Payer: Self-pay | Attending: Emergency Medicine | Admitting: Emergency Medicine

## 2011-10-06 DIAGNOSIS — F341 Dysthymic disorder: Secondary | ICD-10-CM | POA: Insufficient documentation

## 2011-10-06 DIAGNOSIS — I10 Essential (primary) hypertension: Secondary | ICD-10-CM | POA: Insufficient documentation

## 2011-10-06 DIAGNOSIS — F1010 Alcohol abuse, uncomplicated: Secondary | ICD-10-CM | POA: Insufficient documentation

## 2011-10-06 DIAGNOSIS — F32A Depression, unspecified: Secondary | ICD-10-CM

## 2011-10-06 DIAGNOSIS — F329 Major depressive disorder, single episode, unspecified: Secondary | ICD-10-CM

## 2011-10-06 HISTORY — DX: Essential (primary) hypertension: I10

## 2011-10-06 LAB — CBC
HCT: 32.4 % — ABNORMAL LOW (ref 36.0–46.0)
Hemoglobin: 10.7 g/dL — ABNORMAL LOW (ref 12.0–15.0)
MCH: 29.6 pg (ref 26.0–34.0)
MCHC: 33 g/dL (ref 30.0–36.0)
MCV: 89.8 fL (ref 78.0–100.0)
Platelets: 136 10*3/uL — ABNORMAL LOW (ref 150–400)
RBC: 3.61 MIL/uL — ABNORMAL LOW (ref 3.87–5.11)
RDW: 15.5 % (ref 11.5–15.5)
WBC: 5.3 10*3/uL (ref 4.0–10.5)

## 2011-10-06 LAB — URINALYSIS, ROUTINE W REFLEX MICROSCOPIC
Bilirubin Urine: NEGATIVE
Glucose, UA: NEGATIVE mg/dL
Hgb urine dipstick: NEGATIVE
Ketones, ur: 40 mg/dL — AB
Leukocytes, UA: NEGATIVE
Nitrite: NEGATIVE
Protein, ur: NEGATIVE mg/dL
Specific Gravity, Urine: 1.016 (ref 1.005–1.030)
Urobilinogen, UA: 0.2 mg/dL (ref 0.0–1.0)
pH: 7.5 (ref 5.0–8.0)

## 2011-10-06 LAB — RAPID URINE DRUG SCREEN, HOSP PERFORMED
Amphetamines: NOT DETECTED
Barbiturates: NOT DETECTED
Benzodiazepines: NOT DETECTED
Cocaine: NOT DETECTED
Opiates: NOT DETECTED
Tetrahydrocannabinol: NOT DETECTED

## 2011-10-06 LAB — BASIC METABOLIC PANEL
BUN: 8 mg/dL (ref 6–23)
CO2: 22 mEq/L (ref 19–32)
Calcium: 9.3 mg/dL (ref 8.4–10.5)
Chloride: 98 mEq/L (ref 96–112)
Creatinine, Ser: 0.67 mg/dL (ref 0.50–1.10)
GFR calc Af Amer: >90 mL/min (ref >90)
GFR calc non Af Amer: >90 mL/min (ref >90)
Glucose, Bld: 126 mg/dL — ABNORMAL HIGH (ref 70–99)
Potassium: 3.4 mEq/L — ABNORMAL LOW (ref 3.5–5.1)
Sodium: 139 mEq/L (ref 135–145)

## 2011-10-06 LAB — HEPATIC FUNCTION PANEL
ALT: 28 U/L (ref 0–35)
AST: 67 U/L — ABNORMAL HIGH (ref 0–37)
Albumin: 4 g/dL (ref 3.5–5.2)
Alkaline Phosphatase: 63 U/L (ref 39–117)
Bilirubin, Direct: 0.1 mg/dL (ref 0.0–0.3)
Indirect Bilirubin: 0.4 mg/dL (ref 0.3–0.9)
Total Bilirubin: 0.5 mg/dL (ref 0.3–1.2)
Total Protein: 7.3 g/dL (ref 6.0–8.3)

## 2011-10-06 LAB — ETHANOL: Alcohol, Ethyl (B): <11 mg/dL (ref 0–11)

## 2011-10-06 MED ORDER — VITAMIN B-1 100 MG PO TABS
100.0000 mg | ORAL_TABLET | Freq: Every day | ORAL | Status: DC
  Administered 2011-10-07: 100 mg via ORAL
  Filled 2011-10-06: qty 1

## 2011-10-06 MED ORDER — ZOLPIDEM TARTRATE 5 MG PO TABS: 5.0000 mg | ORAL_TABLET | Freq: Every evening | ORAL | Status: DC | PRN

## 2011-10-06 MED ORDER — AMLODIPINE BESYLATE 5 MG PO TABS
5.0000 mg | ORAL_TABLET | Freq: Every day | ORAL | Status: DC
  Administered 2011-10-07: 5 mg via ORAL
  Filled 2011-10-06: qty 1

## 2011-10-06 MED ORDER — ACETAMINOPHEN 325 MG PO TABS: 650.0000 mg | ORAL_TABLET | ORAL | Status: DC | PRN

## 2011-10-06 MED ORDER — LORAZEPAM 1 MG PO TABS
1.0000 mg | ORAL_TABLET | Freq: Four times a day (QID) | ORAL | Status: DC | PRN
  Administered 2011-10-07 (×2): 1 mg via ORAL
  Filled 2011-10-06 (×2): qty 1

## 2011-10-06 MED ORDER — ONDANSETRON HCL 4 MG PO TABS
4.0000 mg | ORAL_TABLET | Freq: Three times a day (TID) | ORAL | Status: DC | PRN
  Administered 2011-10-07: 4 mg via ORAL
  Filled 2011-10-06: qty 1

## 2011-10-06 MED ORDER — LORAZEPAM 2 MG/ML IJ SOLN
3.0000 mg | Freq: Once | INTRAMUSCULAR | Status: AC
  Administered 2011-10-06: 3 mg via INTRAVENOUS
  Filled 2011-10-06: qty 2

## 2011-10-06 MED ORDER — LORAZEPAM 2 MG/ML IJ SOLN: 1.0000 mg | Freq: Four times a day (QID) | INTRAMUSCULAR | Status: DC | PRN

## 2011-10-06 MED ORDER — ALUM & MAG HYDROXIDE-SIMETH 200-200-20 MG/5ML PO SUSP: 30.0000 mL | ORAL | Status: DC | PRN

## 2011-10-06 MED ORDER — ADULT MULTIVITAMIN W/MINERALS CH
1.0000 | ORAL_TABLET | Freq: Every day | ORAL | Status: DC
  Administered 2011-10-07: 1 via ORAL
  Filled 2011-10-06: qty 1

## 2011-10-06 MED ORDER — IBUPROFEN 200 MG PO TABS: 600.0000 mg | ORAL_TABLET | Freq: Three times a day (TID) | ORAL | Status: DC | PRN

## 2011-10-06 MED ORDER — FOLIC ACID 1 MG PO TABS
1.0000 mg | ORAL_TABLET | Freq: Every day | ORAL | Status: DC
  Administered 2011-10-07: 1 mg via ORAL
  Filled 2011-10-06: qty 1

## 2011-10-06 MED ORDER — THIAMINE HCL 100 MG/ML IJ SOLN
100.0000 mg | Freq: Every day | INTRAMUSCULAR | Status: DC
Start: 2011-10-07 — End: 2011-10-07

## 2011-10-06 MED ORDER — FAMOTIDINE 20 MG PO TABS
20.0000 mg | ORAL_TABLET | Freq: Two times a day (BID) | ORAL | Status: DC
  Administered 2011-10-07 (×2): 20 mg via ORAL
  Filled 2011-10-06 (×2): qty 1

## 2011-10-06 MED ORDER — SODIUM CHLORIDE 0.9 % IV BOLUS (SEPSIS)
1000.0000 mL | Freq: Once | INTRAVENOUS | Status: AC
  Administered 2011-10-06: 1000 mL via INTRAVENOUS

## 2011-10-06 NOTE — ED Notes (Signed)
Pt wanded and in paper scrubs. Pt transferred to psych ed

## 2011-10-06 NOTE — ED Provider Notes (Addendum)
History    46yf with etoh abuse. Says has been drinking almost daily since 46 years old. Just getting tired of it and affect on her life. Significant other died of what sounds like complications of etoh abuse last year and pt doesn't want to end up same way. Hx of depression. Doesn't take meds because doesn't want to mix them with etoh. Denies si but increasingly depressed and not sure why. No hi. Does sometimes have visual hallucinations when doesn't drink but denies currently. Never had seizure that she is aware of. Last drink yesterday. Denies drug use.  CSN: [id number]  Arrival date & time 10/06/11  1820   First MD Initiated Contact with Patient 10/06/11 1840      No chief complaint on file.   (Consider location/radiation/quality/duration/timing/severity/associated sxs/prior treatment) HPI  Past Medical History  Diagnosis Date  . Anxiety   . Depression   . Hypertension     Past Surgical History  Procedure Date  . Cesarean section     No family history on file.  History  Substance Use Topics  . Smoking status: Passive Smoker -- 1.0 packs/day    Types: Cigarettes  . Smokeless tobacco: Not on file  . Alcohol Use: Yes    OB History    Grav Para Term Preterm Abortions TAB SAB Ect Mult Living                  Review of Systems   Review of symptoms negative unless otherwise noted in HPI.   Allergies  Hydrocodone-acetaminophen and Valium  Home Medications   Current Outpatient Rx  Name Route Sig Dispense Refill  . ALPRAZOLAM 1 MG PO TABS Oral Take 1 mg by mouth 3 (three) times daily as needed. anxiety    . AMLODIPINE BESYLATE 5 MG PO TABS Oral Take 5 mg by mouth daily.    Marland Kitchen FAMOTIDINE 20 MG PO TABS Oral Take 20 mg by mouth 2 (two) times daily.      BP 153/91  Pulse 78  Temp 98.6 F (37 C) (Oral)  Resp 18  SpO2 98%  LMP 09/26/2011  Physical Exam  Nursing note and vitals reviewed. Constitutional: She is oriented to person, place, and time. She appears  well-developed and well-nourished. No distress.  HENT:  Head: Normocephalic and atraumatic.       Poor dentition  Eyes: Conjunctivae are normal. Right eye exhibits no discharge. Left eye exhibits no discharge.  Neck: Neck supple.  Cardiovascular: Normal rate, regular rhythm and normal heart sounds.  Exam reveals no gallop and no friction rub.   No murmur heard. Pulmonary/Chest: Effort normal and breath sounds normal. No respiratory distress.  Abdominal: Soft. She exhibits no distension. There is no tenderness.  Musculoskeletal: She exhibits no edema and no tenderness.  Neurological: She is alert and oriented to person, place, and time. No cranial nerve deficit. She exhibits normal muscle tone. Coordination normal.  Skin: Skin is warm and dry. She is not diaphoretic.  Psychiatric: Her behavior is normal. Thought content normal.       Tearful at times. Speech clear and content appropriate. No evidence of psychosis or cognitive impairment.    ED Course  Procedures (including critical care time)  Labs Reviewed  CBC - Abnormal; Notable for the following:    RBC 3.61 (*)     Hemoglobin 10.7 (*)     HCT 32.4 (*)     Platelets 136 (*)     All other components within normal limits  BASIC METABOLIC PANEL - Abnormal; Notable for the following:    Potassium 3.4 (*)     Glucose, Bld 126 (*)     All other components within normal limits  URINALYSIS, ROUTINE W REFLEX MICROSCOPIC - Abnormal; Notable for the following:    APPearance CLOUDY (*)     Ketones, ur 40 (*)     All other components within normal limits  HEPATIC FUNCTION PANEL - Abnormal; Notable for the following:    AST 67 (*)     All other components within normal limits  URINE RAPID DRUG SCREEN (HOSP PERFORMED)  ETHANOL   No results found.   1. Alcohol abuse   2. Depression    EKG:  Rhythm: sinus brady Rate: 58 Axis: normal Intervals: normal ST segments: t wave inversions anteriorly. Noted on previous.   MDM  46yf  with hx of etoh abuse and depression. Denies si or hi. No evidence of psychosis. Pt would like assistance for possible rehab and also depression. Discussed with act to eval.        Raeford Razor, MD 10/07/11 0002  Raeford Razor, MD 10/07/11 4098

## 2011-10-06 NOTE — ED Notes (Signed)
[id number]> Expected date:<BR> Expected time:<BR> Means of arrival:<BR> Comments:<BR> Medic 10

## 2011-10-06 NOTE — ED Notes (Signed)
Sinus on monitor and cbg was 101. Zofran 4mg  given in route. Bp 152/98 HR 90

## 2011-10-06 NOTE — ED Notes (Signed)
Per EMS pt from home near syncope episode and was shaking. Pt reports that she has not had "my fifth of liquor today". Pt also reports cramping in her arms.

## 2011-10-07 ENCOUNTER — Encounter (HOSPITAL_COMMUNITY): Payer: Self-pay

## 2011-10-07 ENCOUNTER — Inpatient Hospital Stay (HOSPITAL_COMMUNITY)
Admission: AD | Admit: 2011-10-07 | Discharge: 2011-10-11 | DRG: 897 | Disposition: A | Discharge status: Home / Self Care | Payer: No Typology Code available for payment source | Source: Ambulatory Visit | Attending: Psychiatry | Admitting: Psychiatry

## 2011-10-07 DIAGNOSIS — F10239 Alcohol dependence with withdrawal, unspecified: Principal | ICD-10-CM | POA: Diagnosis present

## 2011-10-07 DIAGNOSIS — F10939 Alcohol use, unspecified with withdrawal, unspecified: Principal | ICD-10-CM | POA: Diagnosis present

## 2011-10-07 DIAGNOSIS — K219 Gastro-esophageal reflux disease without esophagitis: Secondary | ICD-10-CM | POA: Diagnosis present

## 2011-10-07 DIAGNOSIS — F1020 Alcohol dependence, uncomplicated: Secondary | ICD-10-CM | POA: Diagnosis present

## 2011-10-07 DIAGNOSIS — I10 Essential (primary) hypertension: Secondary | ICD-10-CM | POA: Diagnosis present

## 2011-10-07 DIAGNOSIS — E785 Hyperlipidemia, unspecified: Secondary | ICD-10-CM | POA: Diagnosis present

## 2011-10-07 DIAGNOSIS — D573 Sickle-cell trait: Secondary | ICD-10-CM

## 2011-10-07 DIAGNOSIS — F10988 Alcohol use, unspecified with other alcohol-induced disorder: Secondary | ICD-10-CM | POA: Diagnosis present

## 2011-10-07 MED ORDER — MAGNESIUM HYDROXIDE 400 MG/5ML PO SUSP: 30.0000 mL | Freq: Every day | ORAL | Status: DC | PRN

## 2011-10-07 MED ORDER — ONDANSETRON 4 MG PO TBDP: 4.0000 mg | ORAL_TABLET | Freq: Four times a day (QID) | ORAL | Status: AC | PRN

## 2011-10-07 MED ORDER — CHLORDIAZEPOXIDE HCL 25 MG PO CAPS
25.0000 mg | ORAL_CAPSULE | Freq: Three times a day (TID) | ORAL | Status: AC
  Administered 2011-10-10: 25 mg via ORAL
  Filled 2011-10-07: qty 1

## 2011-10-07 MED ORDER — THIAMINE HCL 100 MG/ML IJ SOLN: 100.0000 mg | Freq: Once | INTRAMUSCULAR | Status: DC

## 2011-10-07 MED ORDER — CHLORDIAZEPOXIDE HCL 25 MG PO CAPS
25.0000 mg | ORAL_CAPSULE | Freq: Every day | ORAL | Status: DC
  Filled 2011-10-07: qty 1

## 2011-10-07 MED ORDER — NICOTINE 21 MG/24HR TD PT24
21.0000 mg | MEDICATED_PATCH | Freq: Every day | TRANSDERMAL | Status: DC
  Administered 2011-10-08 – 2011-10-11 (×4): 21 mg via TRANSDERMAL
  Filled 2011-10-07 (×7): qty 1

## 2011-10-07 MED ORDER — CHLORDIAZEPOXIDE HCL 25 MG PO CAPS
25.0000 mg | ORAL_CAPSULE | ORAL | Status: AC
  Administered 2011-10-10 – 2011-10-11 (×2): 25 mg via ORAL
  Filled 2011-10-07: qty 1

## 2011-10-07 MED ORDER — LOPERAMIDE HCL 2 MG PO CAPS: 2.0000 mg | ORAL_CAPSULE | ORAL | Status: AC | PRN

## 2011-10-07 MED ORDER — ADULT MULTIVITAMIN W/MINERALS CH
1.0000 | ORAL_TABLET | Freq: Every day | ORAL | Status: DC
  Administered 2011-10-08 – 2011-10-11 (×4): 1 via ORAL
  Filled 2011-10-07 (×7): qty 1

## 2011-10-07 MED ORDER — ALUM & MAG HYDROXIDE-SIMETH 200-200-20 MG/5ML PO SUSP
30.0000 mL | ORAL | Status: DC | PRN
  Administered 2011-10-09 (×2): 30 mL via ORAL

## 2011-10-07 MED ORDER — TRAZODONE HCL 100 MG PO TABS
100.0000 mg | ORAL_TABLET | Freq: Once | ORAL | Status: AC
  Administered 2011-10-07: 100 mg via ORAL
  Filled 2011-10-07 (×2): qty 1

## 2011-10-07 MED ORDER — FAMOTIDINE 20 MG PO TABS: 20.0000 mg | ORAL_TABLET | Freq: Two times a day (BID) | ORAL | Status: DC

## 2011-10-07 MED ORDER — VITAMIN B-1 100 MG PO TABS
100.0000 mg | ORAL_TABLET | Freq: Every day | ORAL | Status: DC
  Administered 2011-10-08 – 2011-10-11 (×4): 100 mg via ORAL
  Filled 2011-10-07 (×6): qty 1

## 2011-10-07 MED ORDER — CHLORDIAZEPOXIDE HCL 25 MG PO CAPS
25.0000 mg | ORAL_CAPSULE | Freq: Once | ORAL | Status: AC
  Administered 2011-10-07: 25 mg via ORAL
  Filled 2011-10-07: qty 1

## 2011-10-07 MED ORDER — HYDROXYZINE HCL 25 MG PO TABS
25.0000 mg | ORAL_TABLET | Freq: Four times a day (QID) | ORAL | Status: AC | PRN
  Administered 2011-10-08 – 2011-10-09 (×3): 25 mg via ORAL

## 2011-10-07 MED ORDER — CHLORDIAZEPOXIDE HCL 25 MG PO CAPS
25.0000 mg | ORAL_CAPSULE | Freq: Four times a day (QID) | ORAL | Status: AC | PRN
  Administered 2011-10-08 – 2011-10-09 (×3): 25 mg via ORAL
  Filled 2011-10-07 (×4): qty 1

## 2011-10-07 MED ORDER — CHLORDIAZEPOXIDE HCL 25 MG PO CAPS
25.0000 mg | ORAL_CAPSULE | Freq: Four times a day (QID) | ORAL | Status: AC
  Administered 2011-10-08 – 2011-10-09 (×5): 25 mg via ORAL
  Filled 2011-10-07 (×6): qty 1

## 2011-10-07 MED ORDER — AMLODIPINE BESYLATE 5 MG PO TABS: 2.5000 mg | ORAL_TABLET | Freq: Every day | ORAL | Status: DC

## 2011-10-07 MED ORDER — AMLODIPINE BESYLATE 5 MG PO TABS: 5.0000 mg | ORAL_TABLET | Freq: Every day | ORAL | Status: DC

## 2011-10-07 NOTE — Progress Notes (Signed)
Pt.  Reports allergy to Valium to writer that it makes her "feel out of whack".   Dr. Arturo Morton made  aware of pt's allergy to Valium.   And said  it was ok to administer the Librium protocol.

## 2011-10-07 NOTE — Progress Notes (Signed)
ED CM spoke with pt who confirmed Dr Philipp Deputy is not her pcp and she is self pay CM reviewed and provided a written copy of self pay pcps in [place] with availability Pt stated she understood and is appreciative of pcps offered Cm reviewed [facility] indigent medication annual program with pt to assist with d/c Pt voiced understanding

## 2011-10-07 NOTE — BHH Counselor (Signed)
Spoke with pt. Stated she did not have her home medications. TC to [facility] to let them know pt may need a bed due to not having Rx. Spoke with Selena Batten in Case Management who obtained approval for assistance with medications. TC with Marcy Panning, the RN. Asked for updated vitals, gave her latest from system. She stated these were still kind of high and asked for RN to monitor these before they could accept her. Will also need to get Rx from MD and get to case management for approval.

## 2011-10-07 NOTE — Tx Team (Signed)
Initial Interdisciplinary Treatment Plan  PATIENT STRENGTHS: (choose at least two) Ability for insight Motivation for treatment/growth Supportive family/friends  PATIENT STRESSORS: Financial difficulties Loss of fiancee* Substance abuse   PROBLEM LIST: Problem List/Patient Goals Date to be addressed Date deferred Reason deferred Estimated date of resolution  Alcohol Abuse 10/07/11     Depression 10/07/11                                                DISCHARGE CRITERIA:  Improved stabilization in mood, thinking, and/or behavior Need for constant or close observation no longer present Verbal commitment to aftercare and medication compliance Withdrawal symptoms are absent or subacute and managed without 24-hour nursing intervention  PRELIMINARY DISCHARGE PLAN: Attend 12-step recovery group Return to previous living arrangement  PATIENT/FAMIILY INVOLVEMENT: This treatment plan has been presented to and reviewed with the patient, Kendra Griffith, and/or family member,  The patient and family have been given the opportunity to ask questions and make suggestions.  Johny Drilling, [name] 10/07/2011, 9:23 PM

## 2011-10-07 NOTE — Progress Notes (Signed)
Rx x 2 tubed to [facility] pharmacy for processing [place] RN to be contacted when indigent medications ready

## 2011-10-07 NOTE — Progress Notes (Signed)
Patient ID: Kendra Griffith, female   DOB: April 23, 1965, 46 y.o.   MRN: [id number] Upon admission, skin assessment WNL.

## 2011-10-07 NOTE — BH Assessment (Signed)
Assessment Note   Kendra Griffith is an 46 y.o. female who presents to [facility] voluntarily requesting detox from alcohol and endorsing depressive symptoms. Pt reports drinking a 1/5 of liquor daily since she was 46 years old. She reports last drink was 1 pint yesterday. She called EMS this morning with near syncope. She is now medically clear and able to ambulate with out assistance. Pt endorses withdrawal symptoms including muscle cramps, lightheadeness, insomnia, and nausea. Pt also states that she will sometimes see shadows when going through detox. She reports current motivation for treatment is due to the death of her fiance summer of 09-27-10, due to complications with alcohol. She reports she is tired of living this way and does not feel like she can detox on her own.  Pt denies SI, HI, AH, and SA other than alcohol. She reports receiving prior mental health treatment at [facility] and [facility] in 09/26/04. She states she has family and friends that are supportive of her. Pt currently lives alone and can return to environment after treatment.   Attempted to get pt to [facility], but would not accept with blood pressure elevated. Pt accepted to [facility] by Verne Spurr, [place] to Dr. Koren Shiver. Completed  Support documentation. Pt is voluntary and to be transported via security.  Axis I: Alcohol Dependence Axis II: Deferred Axis III:  Past Medical History  Diagnosis Date  . Anxiety   . Depression   . Hypertension    Axis IV: problems with access to health care services and problems with primary support group Axis V: 31-40 impairment in reality testing  Past Medical History:  Past Medical History  Diagnosis Date  . Anxiety   . Depression   . Hypertension     Past Surgical History  Procedure Date  . Cesarean section     Family History: No family history on file.  Social History:  reports that she has been passively smoking Cigarettes.  She has been smoking about 1 pack per day. She does not have  any smokeless tobacco history on file. She reports that she drinks alcohol. Her drug history not on file.  Additional Social History:  Alcohol / Drug Use History of alcohol / drug use?: Yes Substance #1 Name of Substance 1: alcohol 1 - Age of First Use: 11 1 - Amount (size/oz): 1/5 of liquor 1 - Frequency: daily 1 - Duration: on and off since 46 years old. 1 - Last Use / Amount: 10/05/11, 1 pint  CIWA: CIWA-Ar BP: 146/99 mmHg Pulse Rate: 82  Nausea and Vomiting: no nausea and no vomiting Tactile Disturbances: none Tremor: no tremor Auditory Disturbances: not present Paroxysmal Sweats: no sweat visible Visual Disturbances: not present Anxiety: no anxiety, at ease Headache, Fullness in Head: none present Agitation: normal activity Orientation and Clouding of Sensorium: oriented and can do serial additions CIWA-Ar Total: 0  COWS:    Allergies:  Allergies  Allergen Reactions  . Hydrocodone-Acetaminophen     REACTION: Upset stomach  . Valium (Diazepam) Other (See Comments)    UNKNOWN    Home Medications:  (Not in a hospital admission)  OB/GYN Status:  Patient's last menstrual period was 09/26/2011.  General Assessment Data Location of Assessment: [facility] Living Arrangements: Alone Can pt return to current living arrangement?: Yes Admission Status: Voluntary Is patient capable of signing voluntary admission?: Yes Transfer from: Acute Hospital Referral Source: Self/Family/Friend  Education Status Is patient currently in school?: No  Risk to self Suicidal Ideation: No Suicidal  Intent: No Is patient at risk for suicide?: No Suicidal Plan?: No Access to Means: No What has been your use of drugs/alcohol within the last 12 months?: alcohol Previous Attempts/Gestures: No How many times?: 0  Other Self Harm Risks: none Triggers for Past Attempts: None known Intentional Self Injurious Behavior: None Family Suicide History: No Recent stressful life event(s): Loss  (Comment) (fiance passed away summer of 09-08-10) Persecutory voices/beliefs?: No Depression: Yes Depression Symptoms: Despondent;Insomnia;Fatigue;Isolating Substance abuse history and/or treatment for substance abuse?: No Suicide prevention information given to non-admitted patients: Not applicable  Risk to Others Homicidal Ideation: No Thoughts of Harm to Others: No Current Homicidal Intent: No Current Homicidal Plan: No Access to Homicidal Means: No Identified Victim: none History of harm to others?: No Assessment of Violence: None Noted Violent Behavior Description: cooperative Does patient have access to weapons?: No Criminal Charges Pending?: No Does patient have a court date: No  Psychosis Hallucinations: None noted Delusions: None noted  Mental Status Report Appear/Hygiene:  (casual) Eye Contact: Fair Motor Activity: Unremarkable Speech: Slow;Logical/coherent Level of Consciousness: Quiet/awake Mood: Depressed Affect: Appropriate to circumstance;Depressed Anxiety Level: None Thought Processes: Coherent;Relevant Judgement: Impaired Orientation: Person;Place;Time;Situation Obsessive Compulsive Thoughts/Behaviors: None  Cognitive Functioning Concentration: Normal Memory: Recent Intact;Remote Intact IQ: Average Insight: Fair Impulse Control: Poor Appetite: Fair Weight Loss: 0  Weight Gain: 0  Sleep: Decreased Vegetative Symptoms: None  ADLScreening [facility] Assessment Services) Patient's cognitive ability adequate to safely complete daily activities?: Yes Patient able to express need for assistance with ADLs?: Yes Independently performs ADLs?: Yes  Abuse/Neglect [facility]) Physical Abuse: Denies Verbal Abuse: Denies Sexual Abuse: Denies  Prior Inpatient Therapy Prior Inpatient Therapy: Yes Prior Therapy Dates: 09-07-2004 Prior Therapy Facilty/Provider(s): [facility] Reason for Treatment: states for medication mamgement  Prior Outpatient Therapy Prior Outpatient  Therapy: Yes Prior Therapy Dates: 09/08/10 Reason for Treatment: depression and SA  ADL Screening (condition at time of admission) Patient's cognitive ability adequate to safely complete daily activities?: Yes Patient able to express need for assistance with ADLs?: Yes Independently performs ADLs?: Yes Weakness of Legs: None Weakness of Arms/Hands: None  Home Assistive Devices/Equipment Home Assistive Devices/Equipment: None    Abuse/Neglect Assessment (Assessment to be complete while patient is alone) Physical Abuse: Denies Verbal Abuse: Denies Sexual Abuse: Denies Exploitation of patient/patient's resources: Denies Self-Neglect: Denies Values / Beliefs Cultural Requests During Hospitalization: None Spiritual Requests During Hospitalization: None   Advance Directives (For Healthcare) Advance Directive: Patient does not have advance directive;Patient would not like information Pre-existing out of facility DNR order (yellow form or pink MOST form): No Nutrition Screen Diet: Regular Unintentional weight loss greater than 10lbs within the last month: No Problems chewing or swallowing foods and/or liquids: No Home Tube Feeding or Total Parenteral Nutrition (TPN): No Patient appears severely malnourished: No Pregnant or Lactating: No  Additional Information 1:1 In Past 12 Months?: No CIRT Risk: No Elopement Risk: No Does patient have medical clearance?: Yes     Disposition:  Disposition Disposition of Patient: Inpatient treatment program Type of inpatient treatment program: Adult Patient referred to: Other (Comment) (Accepted [facility] [place] ([id number]))  On Site Evaluation by:   Reviewed with Physician:     Romeo Apple 10/07/2011 5:51 PM

## 2011-10-07 NOTE — ED Notes (Signed)
Pt cooperative, resting, cooperative, VSS. No s/s of ETOH w/drawl at this time. VSS. Report given to [facility]

## 2011-10-07 NOTE — BH Assessment (Signed)
Assessment Note   Kendra Griffith is a 46 y.o. female who presents to [facility] voluntarily requesting detox from alcohol and endorsing depressive symptoms. Pt reports drinking a 1/5 of liquor daily since she was 45 years old. She reports last drink was 1 pint yesterday. She called EMS this morning with near syncope. She is now medically clear and able to ambulate with out assistance. Pt endorses withdrawal symptoms including muscle cramps, lightheadeness, insomnia, and nausea. Pt also states that she will sometimes see shadows when going through detox. She reports current motivation for treatment is due to the death of her fiance summer of 09-12-10, due to complications with alcohol. She reports she is tired of living this way and does not feel like she can detox on her own.   Pt denies SI, HI, AH, and SA other than alcohol. She reports receiving prior mental health treatment at [facility] and [facility] in 09/11/04. She states she has family and friends that are supportive of her. Pt currently lives alone and can return to environment after treatment.   Axis I: Alcohol Dependence Axis II: Deferred Axis III:  Past Medical History  Diagnosis Date  . Anxiety   . Depression   . Hypertension    Axis IV: other psychosocial or environmental problems Axis V: 41-50 serious symptoms  Past Medical History:  Past Medical History  Diagnosis Date  . Anxiety   . Depression   . Hypertension     Past Surgical History  Procedure Date  . Cesarean section     Family History: No family history on file.  Social History:  reports that she has been passively smoking Cigarettes.  She has been smoking about 1 pack per day. She does not have any smokeless tobacco history on file. She reports that she drinks alcohol. Her drug history not on file.  Additional Social History:  Alcohol / Drug Use History of alcohol / drug use?: Yes Substance #1 Name of Substance 1: alcohol 1 - Age of First Use: 11 1 - Amount  (size/oz): 1/5 of liquor 1 - Frequency: daily 1 - Duration: on and off since 46 years old. 1 - Last Use / Amount: 10/05/11, 1 pint  CIWA: CIWA-Ar BP: 153/91 mmHg Pulse Rate: 78  Nausea and Vomiting: 3 Tactile Disturbances: very mild itching, pins and needles, burning or numbness Tremor: two Auditory Disturbances: not present Paroxysmal Sweats: barely perceptible sweating, palms moist Visual Disturbances: not present Anxiety: two Headache, Fullness in Head: mild Agitation: normal activity Orientation and Clouding of Sensorium: oriented and can do serial additions CIWA-Ar Total: 11  COWS:    Allergies:  Allergies  Allergen Reactions  . Hydrocodone-Acetaminophen     REACTION: Upset stomach  . Valium (Diazepam) Other (See Comments)    UNKNOWN    Home Medications:  (Not in a hospital admission)  OB/GYN Status:  Patient's last menstrual period was 09/26/2011.  General Assessment Data [facility]: [facility] Living Arrangements: Alone Can pt return to current living arrangement?: Yes Admission Status: Voluntary Is patient capable of signing voluntary admission?: Yes Transfer from: Acute Hospital Referral Source: Self/Family/Friend  Education Status Is patient currently in school?: No  Risk to self Suicidal Ideation: No Suicidal Intent: No Is patient at risk for suicide?: No Suicidal Plan?: No Access to Means: No What has been your use of drugs/alcohol within the last 12 months?: alcohol Previous Attempts/Gestures: No How many times?: 0  Other Self Harm Risks: none Triggers for Past Attempts: None  known Intentional Self Injurious Behavior: None Family Suicide History: No Recent stressful life event(s): Loss (Comment) (fiance passed away summer of 10-05-10) Persecutory voices/beliefs?: No Depression: Yes Depression Symptoms: Despondent;Insomnia;Fatigue;Isolating Substance abuse history and/or treatment for substance abuse?: Yes Suicide prevention information  given to non-admitted patients: Not applicable  Risk to Others Homicidal Ideation: No Thoughts of Harm to Others: No Current Homicidal Intent: No Current Homicidal Plan: No Access to Homicidal Means: No Identified Victim: none History of harm to others?: No Assessment of Violence: None Noted Violent Behavior Description: cooperative Does patient have access to weapons?: No Criminal Charges Pending?: No Does patient have a court date: No  Psychosis Hallucinations: None noted Delusions: None noted  Mental Status Report Appear/Hygiene:  (casual) Eye Contact: Fair Motor Activity: Unremarkable Speech: Slow;Logical/coherent Level of Consciousness: Quiet/awake Mood: Depressed Affect: Appropriate to circumstance;Depressed Anxiety Level: None Thought Processes: Coherent;Relevant Judgement: Impaired Orientation: Person;Place;Time;Situation Obsessive Compulsive Thoughts/Behaviors: None  Cognitive Functioning Concentration: Normal Memory: Recent Intact;Remote Intact IQ: Average Insight: Fair Impulse Control: Poor Appetite: Fair Weight Loss: 0  Weight Gain: 0  Sleep: Decreased Vegetative Symptoms: None  ADLScreening [facility] Assessment Services) Patient's cognitive ability adequate to safely complete daily activities?: Yes Patient able to express need for assistance with ADLs?: Yes Independently performs ADLs?: Yes  Abuse/Neglect [facility]) Physical Abuse: Denies Verbal Abuse: Denies Sexual Abuse: Denies  Prior Inpatient Therapy Prior Inpatient Therapy: Yes Prior Therapy Dates: 10/04/2004 Prior Therapy Facilty/Provider(s): [facility] Reason for Treatment: states for medication mamgement  Prior Outpatient Therapy Prior Outpatient Therapy: Yes Prior Therapy Dates: 2010/10/05 Reason for Treatment: depression and SA  ADL Screening (condition at time of admission) Patient's cognitive ability adequate to safely complete daily activities?: Yes Patient able to express need for assistance  with ADLs?: Yes Independently performs ADLs?: Yes Weakness of Legs: None Weakness of Arms/Hands: None  Home Assistive Devices/Equipment Home Assistive Devices/Equipment: None    Abuse/Neglect Assessment (Assessment to be complete while patient is alone) Physical Abuse: Denies Verbal Abuse: Denies Sexual Abuse: Denies Exploitation of patient/patient's resources: Denies Self-Neglect: Denies Values / Beliefs Cultural Requests During Hospitalization: None Spiritual Requests During Hospitalization: None   Advance Directives (For Healthcare) Advance Directive: Patient does not have advance directive;Patient would not like information Pre-existing out of facility DNR order (yellow form or pink MOST form): No Nutrition Screen Diet: Regular Unintentional weight loss greater than 10lbs within the last month: No Problems chewing or swallowing foods and/or liquids: No Home Tube Feeding or Total Parenteral Nutrition (TPN): No Patient appears severely malnourished: No Pregnant or Lactating: No  Additional Information 1:1 In Past 12 Months?: No CIRT Risk: No Elopement Risk: No Does patient have medical clearance?: Yes     Disposition:  Disposition Disposition of Patient: Referred to;Inpatient treatment program Type of inpatient treatment program: Adult Patient referred to: [facility]  On Site Evaluation by:   Reviewed with Physician:     Marjean Donna 10/07/2011 12:43 AM

## 2011-10-07 NOTE — Progress Notes (Signed)
[facility] ED CM consulted by ACT team to assist with chs indigent medications for d/c to detox facility. CM reviewed [facility]as and EPIC and spoke with crystal in [facility] pharmacy to confirm pt is eligible for indigent services.  Assistance available for all medications present except narcotics.  Pending Rx from ACT team

## 2011-10-07 NOTE — ED Provider Notes (Signed)
PT has been drinking 1-2 fifths a day. States her last detox was months ago. No tremor noted today. Pt only c/o being nauseated, nurses informed by me. Pt has been accepted at [facility] and [facility] pending available bed.   Ward Givens, MD 10/07/11 [phone]

## 2011-10-07 NOTE — Progress Notes (Signed)
Voluntary admission for a 46 y.o. Tearful female with flat affect, depressed mood.  Pt. Reports that she has been depressed all her life and Has become increasingly depressed since the death of  her fiance about a year ago. Reports that she has been drinking since the age of 27 and drinks a fifth of alcohol daily.  Pt. Denies SI/HI and  Contracts for safety.  Support given.

## 2011-10-08 MED ORDER — FLUOXETINE HCL 10 MG PO CAPS
10.0000 mg | ORAL_CAPSULE | Freq: Every day | ORAL | Status: DC
  Administered 2011-10-08 – 2011-10-10 (×3): 10 mg via ORAL
  Filled 2011-10-08 (×6): qty 1

## 2011-10-08 NOTE — Progress Notes (Signed)
[facility] Adult Inpatient Family/Significant Other Suicide Prevention Education  Suicide Prevention Education:  Patient Refusal for Family/Significant Other Suicide Prevention Education: The patient Kendra Griffith has refused to provide written consent for family/significant other to be provided Family/Significant Other Suicide Prevention Education during admission and/or prior to discharge.  Physician notified.  Pt. accepted information on suicide prevention, warning signs to look for with suicide and crisis line numbers to use. The pt. agreed to call crisis line numbers if having warning signs or having thoughts of suicide.    [facility] 10/08/2011, 4:41 PM
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
## 2011-10-08 NOTE — H&P (Signed)
Psychiatric Admission Assessment Adult  Patient Identification:  Kendra Griffith Date of Evaluation:  10/08/2011 46yo SAAF CC; Increasingly depressed needs help with alcohol detox so she can start meds. UDS neg. No mesuarable ETOH History of Present Illness: Called EMS to report lightheadedness.Doesn't feel she can stop drinking on her own but is motivated as her fiancee died of complications alcohol abuse last summer. Last here October 2011 for the same.  Has also been to  [facility]  In 2006.Says she is just  tired and is tired of the effect this is having on her life.    Past Psychiatric History: Multiple past detoxes.  Substance Abuse History:  Social History:    reports that she has been smoking Cigarettes.  She has a 58 pack-year smoking history. She does not have any smokeless tobacco history on file. She reports that she drinks alcohol. She reports that she does not use illicit drugs. Began drinking age 68. Reports a 1/5 daily  Family Psych History: Denies  Past Medical History:     Past Medical History  Diagnosis Date  . Anxiety   . Depression   . Hypertension        Past Surgical History  Procedure Date  . Cesarean section   . Cyst removal 1997     Allergies:  Allergies  Allergen Reactions  . Hydrocodone-Acetaminophen     REACTION: Upset stomach  . Valium (Diazepam) Other (See Comments)    UNKNOWN Makes pt. Feel "out of wack"    Current Medications:  Prior to Admission medications   Medication Sig Start Date End Date Taking? Authorizing Provider  ALPRAZolam Prudy Feeler) 1 MG tablet Take 1 mg by mouth 3 (three) times daily as needed. anxiety    Historical Provider, MD  amLODipine (NORVASC) 5 MG tablet Take 5 mg by mouth daily.    Historical Provider, MD  amLODipine (NORVASC) 5 MG tablet Take 1 tablet (5 mg total) by mouth daily. 10/07/11 10/06/12  Ward Givens, MD  famotidine (PEPCID) 20 MG tablet Take 20 mg by mouth 2 (two) times daily.    Historical Provider,  MD  famotidine (PEPCID) 20 MG tablet Take 1 tablet (20 mg total) by mouth 2 (two) times daily. 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  AXIS IV economic problems, housing problems, other psychosocial or environmental problems, problems related to social environment, problems with access to health care services and problems with primary support group  AXIS V 51-60 moderate symptoms   Treatment Plan Summary: Admit for medically supported alcohol detox using Low Dose Librium Protocol  Attending Psychiatrist to start Prozac .

## 2011-10-08 NOTE — Progress Notes (Signed)
D. Pt pleasant on approach, denies SI/HI/hallucinations.  Positive for wrap up group this evening.  Some continued anxiety reported with withdrawal.  Requested medication for sleep this evening.  No other complaints voiced.  A.  Medication given as ordered for withdrawal and for insomnia.  Support and encouragement offered.  R.  Pt sat in dayroom watching TV until dayroom closed, took shower, and went to room for night.  Will continue to monitor for safety.
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
## 2011-10-08 NOTE — Progress Notes (Signed)
D: Pt in bed reading a magazine. Pt is reporting no side effects from Librium ( pt reports an allergy to Valium).  A: Writer administer a vistaril to help with pt's anxiety and insomnia. At [phone] writer administer a prn librium for withdrawal symptoms.   R: Pt did fall asleep one hr after the administration of vistaril. However pt woke up at 0412 complaining of increased sweating and restlessness. Writer administered a librium.Pt is reporting no side effects from taking the librium. Writer will follow up at [phone]

## 2011-10-08 NOTE — Progress Notes (Signed)
Patient ID: Kendra Griffith, female   DOB: Nov 11, 1965, 46 y.o.   MRN: [id number]   [facility] Group Notes:  (Counselor/Nursing/MHT/Case Management/Adjunct)  10/08/2011 1:15 PM  Type of Therapy:  Group Therapy, Dance/Movement Therapy   Participation Level:  Active  Participation Quality:  Appropriate and Sharing  Affect:  Appropriate  Cognitive:  Appropriate  Insight:  Good  Engagement in Group:  Good  Engagement in Therapy:  Good  Modes of Intervention:  Clarification, Problem-solving, Role-play, Socialization and Support  Summary of Progress/Problems: Therapist and group members discussed self-sabotaging behaviors and how are family sometimes act as enablers. Therapist shared the "Five Chapters" and modeled the poem using movement. Therapist and group members then discussed recognizing self-sabotaging behaviors and and the choices we have. Pt. shared details about her past and how she uses to fit in with her family. Pt. spoke of how her family can be enablers and that they call her a "happy drunk" because they enjoyed drinking but she doesn't.     Cassidi Long

## 2011-10-08 NOTE — Progress Notes (Signed)
D-Pt out in milieu interacting with peers and attending groups.Rates depression and hopelessness at 9.Attending groups with minimal participation.Compliant with scheduled medications. A-Tremors,chilling, and agitation r/t withdrawal. Energy is low.Good group participation.A-No physical complaints. Insightful with recovery concepts. Continue current POC and evaluate goals. Cont 15' checks for safety.

## 2011-10-09 NOTE — Progress Notes (Signed)
  Kendra Griffith is a 46 y.o. female [id number] Nov 27, 1965  10/07/2011 Active Problems:  * No active hospital problems. *    Mental Status: Mood is euthymic denies SI/HIAVH.    Subjective/Objective: Asking for discharge -afraid that she will loose her apartment if she doesn't get back soon. Tolerated first dose of Prozac.    Filed Vitals:   10/09/11 1218  BP: 135/98  Pulse: 101  Temp:   Resp:     Lab Results:   BMET    Component Value Date/Time   NA 139 10/06/2011 1852   K 3.4* 10/06/2011 1852   CL 98 10/06/2011 1852   CO2 22 10/06/2011 1852   GLUCOSE 126* 10/06/2011 1852   BUN 8 10/06/2011 1852   CREATININE 0.67 10/06/2011 1852   CALCIUM 9.3 10/06/2011 1852   GFRNONAA >90 10/06/2011 1852   GFRAA >90 10/06/2011 1852    Medications:  Scheduled:     . chlordiazePOXIDE  25 mg Oral QID   Followed by  . chlordiazePOXIDE  25 mg Oral TID   Followed by  . chlordiazePOXIDE  25 mg Oral BH-qamhs   Followed by  . chlordiazePOXIDE  25 mg Oral Daily  . FLUoxetine  10 mg Oral QHS  . multivitamin with minerals  1 tablet Oral Daily  . nicotine  21 mg Transdermal Q0600  . thiamine  100 mg Intramuscular Once  . thiamine  100 mg Oral Daily     PRN Meds alum & mag hydroxide-simeth, chlordiazePOXIDE, hydrOXYzine, loperamide, magnesium hydroxide, ondansetron  Plan: continue current plan of care.  Merelyn Klump,MICKIE D. 10/09/2011

## 2011-10-09 NOTE — Progress Notes (Signed)
Patient ID: Kendra Griffith, female   DOB: [birth year], 46 y.o.   MRN: [id number]  Patient's original PSA was completed on October 08, 2011. However, on October 09, 2011, pt. came to counseling intern, Cassidi Long, who had completed the original PSA, and wanted to retract previous statements. Intern Long met with pt. and went over original PSA. Pt. wanted to change alcohol use from a fifth ever day, sometimes more to a pint, every other day.

## 2011-10-09 NOTE — Progress Notes (Signed)
D.  Pt pleasant on approach, sitting in dayroom watching TV with peers.  Did not sleep well last night as room mate was up most of night.  Denies SI/HI/hallucinations. Positive for evening AA group.   Anxious to return home so that she does not lose her apartment.  A.  Support and encouragement offered, medication give as ordered to help with sleep tonight.  Will continue to monitor.  R.  Pt remains safe

## 2011-10-09 NOTE — Progress Notes (Signed)
Patient ID: Kendra Griffith, female   DOB: [birth year], 46 y.o.   MRN: [id number]  Pt. attended and participated in aftercare planning group. Pt. verbally accepted information on suicide prevention, warning signs to look for with suicide and crisis line numbers to use.  Pt. listed their current anxiety level as 2 and their current depression level as 2.

## 2011-10-09 NOTE — Progress Notes (Signed)
Patient ID: Kendra Griffith, female   DOB: 07-04-1965, 46 y.o.   MRN: [id number] Pt. Denies lethality and A/V/H's: Pt. Does c/o anxiety and depression. Pt. Inventory: Sleep:"poor"; appetite: "good"; energy level: "low" Depression="7"/10; Hopeless/Helplessness= "6"/10; "agitation"; "blurred vision". States she is "comitted to staying on track". Pt. accepted am dose of Librium for her withdrawal symptoms, with good results.

## 2011-10-10 DIAGNOSIS — F10239 Alcohol dependence with withdrawal, unspecified: Principal | ICD-10-CM

## 2011-10-10 DIAGNOSIS — F1994 Other psychoactive substance use, unspecified with psychoactive substance-induced mood disorder: Secondary | ICD-10-CM

## 2011-10-10 DIAGNOSIS — F1020 Alcohol dependence, uncomplicated: Secondary | ICD-10-CM

## 2011-10-10 MED ORDER — FAMOTIDINE 20 MG PO TABS
20.0000 mg | ORAL_TABLET | Freq: Two times a day (BID) | ORAL | Status: DC
  Administered 2011-10-10 – 2011-10-11 (×2): 20 mg via ORAL
  Filled 2011-10-10 (×6): qty 1

## 2011-10-10 MED ORDER — AMLODIPINE BESYLATE 5 MG PO TABS
5.0000 mg | ORAL_TABLET | Freq: Every day | ORAL | Status: DC
  Administered 2011-10-10 – 2011-10-11 (×2): 5 mg via ORAL
  Filled 2011-10-10 (×4): qty 1

## 2011-10-10 NOTE — Progress Notes (Signed)
[facility] MD Progress Note  10/10/2011 2:20 PM   S/O: .Patient seen and evaluated. Chart reviewed. Patient stated that her mood was "good". Her affect was mood congruent and euthymic. She denied any current thoughts of self injurious behavior, suicidal ideation or homicidal ideation. There were no auditory or visual hallucinations, paranoia, delusional thought processes, or mania noted.  Thought process was linear and goal directed.  No psychomotor agitation or retardation was noted. Speech was normal rate, tone and volume. Eye contact was good. Judgment and insight are fair.  Patient has been up and engaged on the unit.  No acute safety concerns reported from team.  BP and HR elevated.  Sleep:  Number of Hours: 3.25    Vital Signs:Blood pressure 141/98, pulse 107, temperature 98.2 F (36.8 C), temperature source Oral, resp. rate 18, height 5\' 3"  (1.6 m), weight 62.143 kg (137 lb), last menstrual period 09/26/2011.  Current Medications:    . chlordiazePOXIDE  25 mg Oral TID   Followed by  . chlordiazePOXIDE  25 mg Oral BH-qamhs   Followed by  . chlordiazePOXIDE  25 mg Oral Daily  . FLUoxetine  10 mg Oral QHS  . multivitamin with minerals  1 tablet Oral Daily  . nicotine  21 mg Transdermal Q0600  . thiamine  100 mg Intramuscular Once  . thiamine  100 mg Oral Daily     Lab Results: No results found for this or any previous visit (from the past 48 hour(s)).  Physical Findings: CIWA:  CIWA-Ar Total: 2   A/P: Alcohol Dependence & W/D; SIMD; HTN; GERD  Restarted amlodipine and pepcid.  Cont taper.  D/C Weds. Pt agreeable with plan.  Discussed with team.  Lupe Carney 10/10/2011, 2:20 PM

## 2011-10-10 NOTE — Progress Notes (Signed)
Pt has been up and active in the milieu today.  She rated both her depression and anxiety a 0 on her self-inventory this morning.  She denied any anxiety or symptoms of withdrawal today.  She denied any S/H ideation or A/V hallucinations.  However, she did admit to feeling agitated she stated,"I have had agitation all my life"She plans to get a sponsor and attend AA meetings.  Her main compliant today has been her teeth.  She has denied any need for medication thus far but is interested in md giving her a referral to a dentist.  She did write on her self-inventory that "I need help with medication, and a prescription for anxiety-panic attacks"  When asked why she denied any anxiety at the beginning of our 1:1 today.  She stated,"well, I meant when I start thinking about everything"  Spent some time talking about her learning some new coping skills here and she did agree and voice understanding.  She then mentioned going to meeting at a church she knows three times a week.

## 2011-10-10 NOTE — Progress Notes (Signed)
[facility] Group Notes:  (Counselor/Nursing/MHT/Case Management/Adjunct)  10/10/2011 3:33 PM  Type of Therapy:  Group Therapy from 1:15 to 2:30  Participation Level:  Minimal  Participation Quality:  Attentive  Affect:  Appropriate  Cognitive:  Alert and Oriented  Insight:  None shared  Engagement in Group:  Limited  Engagement in Therapy:  Limited  Modes of Intervention:  Orientation, Problem-solving, Socialization and Support  Summary of Progress/Problems:  Group discussion focused on what patient's see as their own obstacles to recovery. Before leaving to meet with medical staff Mikaila shared that she was looking forward to spending more time with her family this summer and sobriety would make that possible. Patient was in and out of group room and in and out of conversation with another group member yet responded well to redirection.      Clide Dales 10/10/2011,3:37 PM

## 2011-10-10 NOTE — Progress Notes (Signed)
[facility] Group Notes:  (Counselor/Nursing/MHT/Case Management/Adjunct)  10/10/2011 1:53 PM  Type of Therapy:  Psychoeducational Skills  Participation Level:  Active  Participation Quality:  Appropriate  Affect:  Appropriate  Cognitive:  Appropriate  Insight:  Good  Engagement in Group:  Good  Engagement in Therapy:  Good  Modes of Intervention:  Support  Summary of Progress/Problems:   Kendra Griffith 10/10/2011, 1:53 PM

## 2011-10-10 NOTE — Discharge Planning (Signed)
10/10/2011 Kendra Griffith  SW met with pt. during discharge planning group.  Pt. Reported entering the hospital following a binge drinking episode.   Pt. Reported she began her binge drinking episode because the anniversary of losing her fiance is approaching next month and she was using the alcohol to cope with the loss.  Pt. Reported having a strong family support system in her children who live out of state.  Pt. states her wellness sobriety plan following discharge from the hospital is to follow-up with her AA meetings at [facility] in which she has a good sponsor.  SW will continue to assess for referrals.  Clarice Pole, LCASA 10/10/2011, 12:14 PM

## 2011-10-11 MED ORDER — FLUOXETINE HCL 10 MG PO CAPS
10.0000 mg | ORAL_CAPSULE | Freq: Every day | ORAL | Status: DC
  Filled 2011-10-11: qty 14

## 2011-10-11 MED ORDER — FAMOTIDINE 20 MG PO TABS
20.0000 mg | ORAL_TABLET | Freq: Two times a day (BID) | ORAL | Status: DC
  Filled 2011-10-11 (×2): qty 28

## 2011-10-11 MED ORDER — FLUOXETINE HCL 10 MG PO CAPS: 10.0000 mg | ORAL_CAPSULE | Freq: Every day | ORAL | Status: DC

## 2011-10-11 MED ORDER — AMLODIPINE BESYLATE 5 MG PO TABS: 5.0000 mg | ORAL_TABLET | Freq: Every day | ORAL | Status: DC

## 2011-10-11 MED ORDER — FAMOTIDINE 20 MG PO TABS: 20.0000 mg | ORAL_TABLET | Freq: Two times a day (BID) | ORAL | Status: DC

## 2011-10-11 NOTE — Progress Notes (Signed)
Patient ID: Kendra Griffith, female   DOB: 01-05-1966, 46 y.o.   MRN: [id number] Patient denies SI/HI and A/V hallucinations. Discharge instructions , follow up appointment explained, prescriptions, and samples given to patient. Patient states understanding of discharge instructions. Patient verbalizes receipt of all belongings. Patient given bus pass for transportation.

## 2011-10-11 NOTE — Treatment Plan (Signed)
Interdisciplinary Treatment Plan Update (Adult)  Date: 10/11/2011  Time Reviewed: 10:56 AM   Progress in Treatment: Attending groups: Yes Participating in groups: Yes Taking medication as prescribed: Yes Tolerating medication: Yes   Family/Significant other contact made: No  Patient understands diagnosis:  Yes  As evidenced by asking for help with detox from alcohol Discussing patient identified problems/goals with staff:  Yes  See below Medical problems stabilized or resolved:  Yes Denies suicidal/homicidal ideation: Yes  In tx team Issues/concerns per patient self-inventory:  None noted Other:  New problem(s) identified: N/A  Reason for Continuation of Hospitalization: Other; describe D/C today  Interventions implemented related to continuation of hospitalization:   Additional comments:  Estimated length of stay:  Discharge Plan: Return home, see below  New goal(s): N/A  Review of initial/current patient goals per problem list:   1.  Goal(s): Safely detox from alcohol  Met:  Yes  Target date:7/1  As evidenced [id number] vitals, no withdrawal symptoms  2.  Goal (s): Stabilize mood  Met:  Yes  Target date:7/1  As evidenced [id number] score of 3 or less  3.  Goal(s):  Identify comprehensive sobriety plan  Met:  Yes  Target date:7/1  As evidenced GN:[id number] states she will attend support groups at her church, continue to get the Prozac through mental health, and follow up with a therapist, [facility]  4.  Goal(s):  Met:  Yes  Target date:  As evidenced by:  Attendees: Patient: Kendra Griffith  10/11/2011 10:56 AM  Family:     Physician:  Lupe Carney 10/11/2011 10:56 AM   Nursing:  Chinita Greenland  10/11/2011 10:56 AM   Case Manager:  Richelle Ito, LCSW 10/11/2011 10:56 AM   Counselor:  Ronda Fairly, LCSWA 10/11/2011 10:56 AM   Other:     Other:     Other:     Other:      Scribe for Treatment Team:   Ida Rogue,  10/11/2011 10:56 AM

## 2011-10-11 NOTE — Progress Notes (Signed)
[facility] Group Notes:  (Counselor/Nursing/MHT/Case Management/Adjunct)  10/11/2011 3:38 PM  Type of Therapy:  Group Therapy from 1:15 to 2:30  Participation Level:  Did Not Attend  Clide Dales 10/11/2011, 3:38 PM

## 2011-10-11 NOTE — Progress Notes (Signed)
[facility] Group Notes:  (Counselor/Nursing/MHT/Case Management/Adjunct)  10/11/2011 1:52 PM  Type of Therapy:  Psychoeducational Skills  Participation Level:  Minimal  Participation Quality:  Inattentive  Affect:  Appropriate  Cognitive:  Alert, Appropriate and Oriented  Insight:  Limited  Engagement in Group:  Limited  Engagement in Therapy:  n/a  Modes of Intervention:  Activity, Clarification, Education, Limit-setting, Problem-solving, Socialization and Support  Summary of Progress/Problems: Kendra Griffith attended Psychoeducational group on labels. Kendra Griffith participated in an activity labeling self and peers and choose to label herself as a judge for the activity. Kendra Griffith was distracted by peers while group discussed what labels are, how we use them, and listed positive and negative labels they have used or been called. Kendra Griffith was given a homework assignment to list 10 words he has been labeled to find the reality of the situation/label.     Kendra Griffith 10/11/2011, 1:52 PM

## 2011-10-11 NOTE — Progress Notes (Signed)
Patient ID: Kendra Griffith, female   DOB: Nov 09, 1965, 46 y.o.   MRN: [id number] D: Pt was pleasant and cooperative during the assessment process. Stated it was a combination of things that led to her admission to [facility]. During group pt stated she held her finance in her arms as he died to due alcoholism. "He bled from his nose, eyes, penis, and ears. I know that I have to die some day but I don't want it to be like that".  Stated she also has twin grandchildren that turned a 81 yr old in June.   A:  Support and encouragement offered. 15 minute checks continued.   R: Pt responded well and remains safe.
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
## 2011-10-11 NOTE — Progress Notes (Signed)
[facility] Group Notes:  (Counselor/Nursing/MHT/Case Management/Adjunct)  10/11/2011 2:15 PM  Type of Therapy:  Psychoeducational Skills  Participation Level:  Active  Participation Quality:  Appropriate  Affect:  Appropriate  Cognitive:  Appropriate  Insight:  Good  Engagement in Group:  Good  Engagement in Therapy:  Good  Modes of Intervention:  Support  Summary of Progress/Problems:   Kendra Griffith 10/11/2011, 2:15 PM

## 2011-10-11 NOTE — Progress Notes (Signed)
[facility] Case Management Discharge Plan:  Will you be returning to the same living situation after discharge: Yes,  home At discharge, do you have transportation home?:Yes,  family Do you have the ability to pay for your medications:Yes,  mental health referral  Interagency Information:     Release of information consent forms completed and in the chart;  Patient's signature needed at discharge.  Patient to Follow up at:  Follow-up Information    Follow up with [facility]. (Walk in M-F between 8 and 9AM for your hospital follow up appointment.  This is where you will see a psychiatrist about your Prozac)    Contact information:   [address]  [place] [phone]      Follow up with [facility].      Follow up with [facility] [place]. (If you choose to go here, walk in M-F at 8 or 1.  This is where you can  see a therapist if you choose)    Contact information:   [address]  [place]  [336]           Patient denies SI/HI:   Yes,  yes    Aeronautical engineer and Suicide Prevention discussed:  Yes,  yes  Barrier to discharge identified:No.  Summary and Recommendations:   Ida Rogue 10/11/2011, 11:05 AM

## 2011-10-12 NOTE — Progress Notes (Signed)
Patient Discharge Instructions:  After Visit Summary (AVS):   Faxed to:  10/12/2011 Psychiatric Admission Assessment Note:   Faxed to:  10/12/2011 Suicide Risk Assessment - Discharge Assessment:   Faxed to:  10/12/2011 Faxed/Sent to the Next Level Care provider:  10/12/2011  Faxed to [facility] @ [phone]  Heloise Purpura Eduard Clos, 10/12/2011, 6:37 PM

## 2011-10-23 ENCOUNTER — Emergency Department (HOSPITAL_COMMUNITY)
Admission: EM | Admit: 2011-10-23 | Discharge: 2011-10-23 | Disposition: A | Discharge status: Home / Self Care | Payer: Self-pay | Attending: Emergency Medicine | Admitting: Emergency Medicine

## 2011-10-23 ENCOUNTER — Encounter (HOSPITAL_COMMUNITY): Payer: Self-pay | Admitting: *Deleted

## 2011-10-23 DIAGNOSIS — F3289 Other specified depressive episodes: Secondary | ICD-10-CM | POA: Insufficient documentation

## 2011-10-23 DIAGNOSIS — F329 Major depressive disorder, single episode, unspecified: Secondary | ICD-10-CM | POA: Insufficient documentation

## 2011-10-23 DIAGNOSIS — I10 Essential (primary) hypertension: Secondary | ICD-10-CM | POA: Insufficient documentation

## 2011-10-23 DIAGNOSIS — F1010 Alcohol abuse, uncomplicated: Secondary | ICD-10-CM | POA: Insufficient documentation

## 2011-10-23 DIAGNOSIS — F411 Generalized anxiety disorder: Secondary | ICD-10-CM | POA: Insufficient documentation

## 2011-10-23 DIAGNOSIS — F17200 Nicotine dependence, unspecified, uncomplicated: Secondary | ICD-10-CM | POA: Insufficient documentation

## 2011-10-23 DIAGNOSIS — Z79899 Other long term (current) drug therapy: Secondary | ICD-10-CM | POA: Insufficient documentation

## 2011-10-23 LAB — COMPREHENSIVE METABOLIC PANEL
Albumin: 4.2 g/dL (ref 3.5–5.2)
BUN: 11 mg/dL (ref 6–23)
Calcium: 9.8 mg/dL (ref 8.4–10.5)
GFR calc Af Amer: >90 mL/min (ref >90)
Glucose, Bld: 87 mg/dL (ref 70–99)
Potassium: 3.6 mEq/L (ref 3.5–5.1)
Sodium: 144 mEq/L (ref 135–145)
Total Protein: 8.1 g/dL (ref 6.0–8.3)

## 2011-10-23 LAB — CBC
HCT: 34.4 % — ABNORMAL LOW (ref 36.0–46.0)
Hemoglobin: 11.7 g/dL — ABNORMAL LOW (ref 12.0–15.0)
MCH: 29.9 pg (ref 26.0–34.0)
MCHC: 34 g/dL (ref 30.0–36.0)
RDW: 15.4 % (ref 11.5–15.5)
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
## 2011-10-23 NOTE — ED Provider Notes (Signed)
History     CSN: [id number]  Arrival date & time 10/23/11  0254   First MD Initiated Contact with Patient 10/23/11 [phone]      Chief Complaint  Patient presents with  . V70.1     Patient is a 46 y.o. female presenting with alcohol problem. The history is provided by the patient.  Alcohol Problem This is a chronic problem. The current episode started more than 1 week ago. The problem occurs constantly. The problem has been gradually worsening. Pertinent negatives include no abdominal pain. Nothing aggravates the symptoms. Nothing relieves the symptoms. She has tried nothing for the symptoms.  pt is here for alcohol detox She has no other complaints She denies SI at this time No seizures reported  Past Medical History  Diagnosis Date  . Anxiety   . Depression   . Hypertension     Past Surgical History  Procedure Date  . Cesarean section   . Cyst removal 1997     History reviewed. No pertinent family history.  History  Substance Use Topics  . Smoking status: Current Everyday Smoker -- 2.0 packs/day for 29 years    Types: Cigarettes  . Smokeless tobacco: Not on file  . Alcohol Use: Yes    OB History    Grav Para Term Preterm Abortions TAB SAB Ect Mult Living                  Review of Systems  Constitutional: Negative for fever.  Gastrointestinal: Negative for abdominal pain.    Allergies  Hydrocodone-acetaminophen and Valium  Home Medications   Current Outpatient Rx  Name Route Sig Dispense Refill  . AMLODIPINE BESYLATE 5 MG PO TABS Oral Take 1 tablet (5 mg total) by mouth daily. For HTN 30 tablet 0  . FAMOTIDINE 20 MG PO TABS Oral Take 1 tablet (20 mg total) by mouth 2 (two) times daily. For Reflux 60 tablet 0  . FLUOXETINE HCL 10 MG PO CAPS Oral Take 1 capsule (10 mg total) by mouth at bedtime. For depression 30 capsule 0    BP 144/98  Pulse 109  Temp 98.6 F (37 C) (Oral)  Resp 20  Ht 5\' 4"  (1.626 m)  Wt 137 lb (62.143 kg)  BMI 23.52 kg/m2   SpO2 99%  LMP 09/26/2011  Physical Exam CONSTITUTIONAL: Well developed/well nourished HEAD AND FACE: Normocephalic/atraumatic EYES: EOMI/PERRL ENMT: Mucous membranes moist NECK: supple no meningeal signs CV: S1/S2 noted, no murmurs/rubs/gallops noted LUNGS: Lungs are clear to auscultation bilaterally, no apparent distress ABDOMEN: soft, nontender, no rebound or guarding NEURO: Pt is awake/alert, moves all extremitiesx4, no tremor noted EXTREMITIES: pulses normal, full ROM SKIN: warm, color normal PSYCH: no abnormalities of mood noted  ED Course  Procedures   Labs Reviewed  CBC - Abnormal; Notable for the following:    Hemoglobin 11.7 (*)     HCT 34.4 (*)     All other components within normal limits  COMPREHENSIVE METABOLIC PANEL - Abnormal; Notable for the following:    Total Bilirubin 0.1 (*)     All other components within normal limits   Pt was just recently in [facility] for alcohol abuse. Pt awake/alert, no tremor noted Denies SI at this time   7:26 AM Pt resting comfortably Stable for d/c  No signs of significant withdrawal  MDM  Nursing notes including past medical history and social history reviewed and considered in documentation labs/vitals reviewed and considered  Joya Gaskins, MD 10/23/11 [phone]

## 2011-10-23 NOTE — ED Notes (Signed)
Patient is alert and oriented x3.  She was given DC instructions and follow up visit instructions.  Patient gave verbal understanding. She was DC ambulatory under his own power to home.  V/S stable.  He was not showing any signs of distress on DC 

## 2011-11-01 NOTE — Discharge Summary (Signed)
Physician Discharge Summary Note  Patient:  Kendra Griffith is an 46 y.o., female MRN:  [id number] DOB:  July 20, 1965 Patient phone:  [phone] (home)  Patient address:   [address] [place] [address],   Date of Admission:  10/07/2011 Date of Discharge: 10/11/11  Reason for Admission:  Discharge Diagnoses: Active Problems:  * No active hospital problems. *    Axis Diagnosis:   AXIS I:  Alcohol Abuse AXIS II:  Deferred AXIS III:   Past Medical History  Diagnosis Date  . Anxiety   . Depression   . Hypertension    AXIS IV:  other psychosocial or environmental problems AXIS V:  67  Level of Care:  OP  Hospital Course:  Called EMS to report lightheadedness.Doesn't feel she can stop drinking on her own but is motivated as her fiancee died of complications alcohol abuse last summer. Last here October 2011 for the same. Has also been to [facility] In 2006.Says she is just tired and is tired of the effect this is having on her life.    Upon admission in this hospital, Ms. Huseman was started on Librium protocol for her alcohol detoxification. She was also enrolled in group counseling sessions and activities to learn coping skills. She also attended AA/NA meetings being offered and held in this unit. She has some previous and or identifiable medical conditions that required treatment or monitoring. She received medication management for all those health issues as well. She was monitored closely for any potential problems that may arise as a result of and or during detoxification treatment. Patient tolerated her treatment regimen and detoxification treatment without any significant adverse effects and or reactions.  Patient attended treatment team meeting this am and met with the team. Her symptoms, substance abuse issues, response to treatment and discharge plans discussed. Patient endorsed that she is doing well and stable for discharge to pursue the next phase of her  substance abuse treatment.  She was encouraged to attend 90 AA meetings in 90 days, get a trusted sponsor from the advise of others or from whomever within the AA meetings seems to make sense, and has a proven track record, and will hold her responsible for her sobriety, and both expects and insists on her total abstinence from alcohol.  It was agreed upon between patient and the team that she will be discharged to her home that she shares her family.  She will continue psychiatric care on outpatient basis at [facility]. She was informed that this is a walk-in appointment between the hours of 08:00 am and 03:00 pm. She is also encouraged to follow-up at the the [facility] for counseling services if she so chooses. This is also a walk-in appointment between the hours of 08:00 and or 1:00 pm Monday through Friday. She is also encouraged to follow-up at the [facility]. She is encouraged to call them and make appointment after discharge. The address, dates and times of these appointments provided for patient. Upon discharge, patient adamantly denies suicidal, homicidal ideations, auditory, visual hallucinations, delusional thinking and or withdrawal symptoms. Patient left [facility] with all personal belongings in no apparent distress. She received 2 weeks worth samples of her discharge medications. Transportation per family.   Consults:  None  Significant Diagnostic Studies:  labs: CBC with diff, CMP, Toxicology  Discharge Vitals:   Blood pressure 136/86, pulse 98, temperature 97.9 F (36.6 C), temperature source Oral, resp. rate 18, height 5\' 3"  (1.6 m), weight  62.143 kg (137 lb), last menstrual period 09/26/2011.  Mental Status Exam: See Mental Status Examination and Suicide Risk Assessment completed by Attending Physician prior to discharge.  Discharge destination:  Home  Is patient on multiple antipsychotic therapies at discharge:  No   Has Patient had three or more failed  trials of antipsychotic monotherapy by history:  No  Recommended Plan for Multiple Antipsychotic Therapies: NA  Discharge Orders    Future Orders Please Complete By Expires   Diet - low sodium heart healthy        Medication List  As of 11/01/2011  7:00 PM   STOP taking these medications         ALPRAZolam 1 MG tablet         TAKE these medications      Indication    amLODipine 5 MG tablet   Commonly known as: NORVASC   Take 1 tablet (5 mg total) by mouth daily. For HTN       famotidine 20 MG tablet   Commonly known as: PEPCID   Take 1 tablet (20 mg total) by mouth 2 (two) times daily. For Reflux       FLUoxetine 10 MG capsule   Commonly known as: PROZAC   Take 1 capsule (10 mg total) by mouth at bedtime. For depression            Follow-up Information    Follow up with [facility]. (Walk in M-F between 8 and 9AM for your hospital follow up appointment.  This is where you will see a psychiatrist about your Prozac)    Contact information:   [address]  [place] [phone]      Follow up with [facility]. (Call them to set up an appointment)    Contact information:   [address] [facility]  [phone]      Follow up with [facility] the [place]. (If you choose to go here, walk in M-F at 8 or 1.  This is where you can  see a therapist if you choose)    Contact information:   [address]  [place]  [336]  [phone]         Follow-up recommendations:  Activity:  as tolerated Other:  Keep all scheduled follow-up appointments as recommended.   Comments:  Take all your medications as prescribed by your mental healthcare provider. Report any adverse effects and or reactions from your medicines to your outpatient provider promptly. Patient is instructed and cautioned to not engage in alcohol and or illegal drug use while on prescription medicines. In the event of worsening symptoms, patient is instructed to call the crisis  hotline, 911 and or go to the nearest ED for appropriate evaluation and treatment of symptoms. Follow-up with your primary care provider for your other medical issues, concerns and or health care needs.     SignedArmandina Stammer I 11/01/2011, 7:00 PM

## 2012-01-23 ENCOUNTER — Ambulatory Visit: Payer: Self-pay | Admitting: Family Medicine

## 2012-02-08 ENCOUNTER — Other Ambulatory Visit: Payer: Self-pay | Admitting: Obstetrics and Gynecology

## 2012-02-08 DIAGNOSIS — Z1231 Encounter for screening mammogram for malignant neoplasm of breast: Secondary | ICD-10-CM

## 2012-02-14 ENCOUNTER — Encounter (HOSPITAL_COMMUNITY): Payer: Self-pay | Admitting: *Deleted

## 2012-02-22 ENCOUNTER — Ambulatory Visit: Payer: Self-pay | Admitting: Advanced Practice Midwife

## 2012-02-28 ENCOUNTER — Ambulatory Visit (HOSPITAL_COMMUNITY)
Admission: RE | Admit: 2012-02-28 | Discharge: 2012-02-28 | Disposition: A | Discharge status: Home / Self Care | Payer: Self-pay | Source: Ambulatory Visit | Attending: Obstetrics and Gynecology | Admitting: Obstetrics and Gynecology

## 2012-02-28 ENCOUNTER — Encounter (HOSPITAL_COMMUNITY): Payer: Self-pay

## 2012-02-28 VITALS — BP 108/68 | Temp 99.1°F | Ht 64.5 in | Wt 145.8 lb

## 2012-02-28 DIAGNOSIS — Z1239 Encounter for other screening for malignant neoplasm of breast: Secondary | ICD-10-CM

## 2012-02-28 DIAGNOSIS — Z1231 Encounter for screening mammogram for malignant neoplasm of breast: Secondary | ICD-10-CM

## 2012-02-28 HISTORY — DX: Anemia, unspecified: D64.9

## 2012-02-28 NOTE — Progress Notes (Signed)
Complaints of Heavy periods that last around 10 days. Patient stated she has some heavy days that bleeds through a large pad in 5 minutes.  Pap Smear:    Pap smear not performed today. Patients last Pap smear was 12/08/2009 and normal. Per patient she has no history of abnormal Pap smears. Pap smear result above is in EPIC. Referred patient to the [facility] for follow up on AUB. Appointment scheduled for Monday, March 19, 2012 at 1515.  Physical exam: Breasts Breasts symmetrical. No skin abnormalities bilateral breasts. No nipple retraction bilateral breasts. No nipple discharge bilateral breasts. No lymphadenopathy. No lumps palpated bilateral breasts. No complaints of pain or tenderness on exam. Screening mammogram to be completed following [facility] appointment.    Pelvic/Bimanual No Pap smear completed today since last Pap smear was 12/08/2009 and normal. Pap smear not indicated per [facility] guidelines.

## 2012-02-28 NOTE — Patient Instructions (Addendum)
Taught patient how to perform BSE. Patient did not need a Pap smear today due to last Pap smear was 12/08/2009. Let her know [facility] will cover Pap smears every 3 years unless has a history of abnormal Pap smears. Told patient her next Pap smear is due 12/08/2012 and she can schedule an appointment to have her next Pap smear with [facility]. Let her know she can either call Sabrina or Martie Lee will call her to schedule. Patient is escorted to mammography for a screening mammogram. Let patient know will follow up with her within the next couple weeks with results by letter or phone. Referred patient to the [facility] for follow up on AUB. Appointment scheduled for Monday, March 19, 2012 at 1515. Patient aware of appointment and given phone number to call if that time doesn't work for her. Let her know that appointment at the clinics is not covered by [facility] and that she will need to pay a $20.00 cop-pay and complete the financial assistance paperwork. Discussed smoking cessation with patient. Patient recently stopped drinking alcohol and is not ready to quit smoking at this time. Told her the [facility] offers free smoking cessation classes and gave her information about the Quit line. Let her now how to sign up for classes if changes her mind or decides she is ready to quit in the future. Patient verbalized understanding.

## 2012-03-19 ENCOUNTER — Ambulatory Visit: Payer: Self-pay | Admitting: Obstetrics and Gynecology

## 2012-04-12 NOTE — Addendum Note (Signed)
Encounter addended by: Saintclair Halsted, RN on: 04/12/2012  4:34 PM<BR>     Documentation filed: Visit Diagnoses, Charges VN

## 2012-04-18 ENCOUNTER — Ambulatory Visit: Payer: Self-pay | Admitting: Obstetrics and Gynecology

## 2012-08-13 ENCOUNTER — Ambulatory Visit: Payer: Self-pay | Admitting: Sports Medicine

## 2012-08-22 ENCOUNTER — Ambulatory Visit (INDEPENDENT_AMBULATORY_CARE_PROVIDER_SITE_OTHER): Payer: Self-pay | Admitting: Sports Medicine

## 2012-08-22 ENCOUNTER — Encounter: Payer: Self-pay | Admitting: Sports Medicine

## 2012-08-22 VITALS — BP 112/79 | HR 73 | Ht 64.0 in | Wt 150.0 lb

## 2012-08-22 DIAGNOSIS — M79609 Pain in unspecified limb: Secondary | ICD-10-CM

## 2012-08-22 DIAGNOSIS — M25579 Pain in unspecified ankle and joints of unspecified foot: Secondary | ICD-10-CM

## 2012-08-22 MED ORDER — TRAMADOL HCL 50 MG PO TABS: 50.0000 mg | ORAL_TABLET | Freq: Three times a day (TID) | ORAL | Status: DC | PRN

## 2012-08-22 NOTE — Progress Notes (Signed)
  Subjective:    Patient ID: Kendra Griffith, female    DOB: Mar 10, 1966, 47 y.o.   MRN: [id number]  HPI chief complaint: Bilateral foot pain  47 year old female comes in today complaining of several years of bilateral feet pain. She is status post surgery on each foot for "removal of extra bone". Surgery was done around 2002 by a Dr. Sherilyn Cooter and [facility]. She's had chronic pain both before and after surgery. Pain is diffuse in both feet but today is present mainly along the plantar aspect of her foot. She's had inserts in the past but they have worn out. She has difficulty standing or walking for long periods of time. No recent trauma.  Past medical history and current medications are reviewed She smokes one to 2 packs of cigarettes a day, she is a recovering alcoholic, she is unemployed    Review of Systems     Objective:   Physical Exam Well-developed, well-nourished. No acute distress. Awake alert and oriented x3  Examination of her feet shows a fairly well-preserved longitudinal arch but collapse of the transverse arch with standing. She has rather prominent callus buildup over the second metatarsal head and she is tender to palpation here. There is no signs of infection. There is no erythema. Good skin color. Good sensation. Good dorsalis pedis and posterior pulses. Bilateral calcaneal eversion, right greater than left. Patient walks with a supinated gait.       Assessment & Plan:  1. Chronic bilateral foot pain status post remote surgery  Patient is fitted with a pair of green sports insoles. I've added a lateral heel wedge to help correct her calcaneal eversion and I've added a metatarsal pad as well. I want to get x-rays of each of her feet but the patient is currently without insurance. She will be getting [employer] next week at which point she is instructed to contact me and I will order her x-rays. She is requesting a medication for pain so I agreed to call her in  some tramadol 50 mg to take 3 times daily as needed for pain. Followup in 4 weeks.

## 2012-08-29 ENCOUNTER — Other Ambulatory Visit: Payer: Self-pay | Admitting: *Deleted

## 2012-08-29 DIAGNOSIS — M79609 Pain in unspecified limb: Secondary | ICD-10-CM

## 2012-09-04 ENCOUNTER — Ambulatory Visit (HOSPITAL_COMMUNITY)
Admission: RE | Admit: 2012-09-04 | Discharge: 2012-09-04 | Disposition: A | Discharge status: Home / Self Care | Payer: No Typology Code available for payment source | Source: Ambulatory Visit | Attending: Sports Medicine | Admitting: Sports Medicine

## 2012-09-04 ENCOUNTER — Other Ambulatory Visit: Payer: Self-pay | Admitting: Sports Medicine

## 2012-09-04 ENCOUNTER — Other Ambulatory Visit: Payer: Self-pay | Admitting: *Deleted

## 2012-09-04 DIAGNOSIS — M79609 Pain in unspecified limb: Secondary | ICD-10-CM | POA: Insufficient documentation

## 2012-09-05 ENCOUNTER — Ambulatory Visit: Payer: Self-pay | Admitting: Internal Medicine

## 2012-09-05 MED ORDER — GELATIN ABSORBABLE 12-7 MM EX MISC
CUTANEOUS | Status: AC
  Filled 2012-09-05: qty 1

## 2012-09-05 MED ORDER — HEPARIN SODIUM (PORCINE) 1000 UNIT/ML IJ SOLN
INTRAMUSCULAR | Status: AC
  Filled 2012-09-05: qty 1

## 2012-09-24 ENCOUNTER — Ambulatory Visit: Payer: Self-pay

## 2012-09-24 ENCOUNTER — Ambulatory Visit (INDEPENDENT_AMBULATORY_CARE_PROVIDER_SITE_OTHER): Payer: No Typology Code available for payment source | Admitting: Sports Medicine

## 2012-09-24 ENCOUNTER — Encounter: Payer: Self-pay | Admitting: Sports Medicine

## 2012-09-24 VITALS — BP 117/84 | HR 85 | Ht 64.0 in | Wt 150.0 lb

## 2012-09-24 DIAGNOSIS — M25579 Pain in unspecified ankle and joints of unspecified foot: Secondary | ICD-10-CM

## 2012-09-24 NOTE — Progress Notes (Signed)
  Subjective:    Patient ID: Kendra Griffith, female    DOB: 1966/01/19, 47 y.o.   MRN: [id number]  HPI Patient comes in today for followup. Green sports insoles are helping somewhat but she still getting some pressure over the first metatarsal head. I reviewed the x-rays of each of her feet. She has hardware in both feet from her previous surgeries. Hardware does not show any evidence of loosening. Screws and pins are in good position. Nothing acute is seen.    Review of Systems     Objective:   Physical Exam Well-developed, well-nourished. No acute distress  Still some tenderness to palpation at the second metatarsal head on the right her she has significant callus buildup. No pain with metatarsal squeeze. Neurovascularly intact distally.  X-rays are as above       Assessment & Plan:  1. Bilateral foot pain status post remote surgery  I changed the metatarsal pad on her green sports insoles from a small to medium and this seemed to help her. I will try to get her an appointment with a local podiatrist to have the calluses shaved down but this will be challenging as she is without health insurance. She is reassured that I see no significant abnormalities on her x-rays. I will plan on reevaluating her again in 4 weeks.

## 2012-09-24 NOTE — Progress Notes (Addendum)
Faxed podiatry referral to [facility]- received notification that they do not have a podiatrist participating with the program.  Referred her to [facility] at comp rehab in [place].

## 2012-09-25 NOTE — Progress Notes (Signed)
Pt let me know that she would not be able afford the $150 charge at [facility].   Referred her to [facility] podiatry- they will call her to schedule appt.

## 2012-09-27 ENCOUNTER — Ambulatory Visit: Payer: Self-pay

## 2012-10-04 ENCOUNTER — Ambulatory Visit: Payer: No Typology Code available for payment source | Attending: Internal Medicine | Admitting: Internal Medicine

## 2012-10-04 VITALS — BP 109/78 | HR 69 | Temp 99.0°F | Resp 16 | Ht 64.0 in | Wt 158.6 lb

## 2012-10-04 DIAGNOSIS — D229 Melanocytic nevi, unspecified: Secondary | ICD-10-CM

## 2012-10-04 DIAGNOSIS — M25572 Pain in left ankle and joints of left foot: Secondary | ICD-10-CM

## 2012-10-04 DIAGNOSIS — M79609 Pain in unspecified limb: Secondary | ICD-10-CM | POA: Insufficient documentation

## 2012-10-04 DIAGNOSIS — D239 Other benign neoplasm of skin, unspecified: Secondary | ICD-10-CM

## 2012-10-04 DIAGNOSIS — M25571 Pain in right ankle and joints of right foot: Secondary | ICD-10-CM

## 2012-10-04 DIAGNOSIS — K089 Disorder of teeth and supporting structures, unspecified: Secondary | ICD-10-CM

## 2012-10-04 DIAGNOSIS — M25579 Pain in unspecified ankle and joints of unspecified foot: Secondary | ICD-10-CM

## 2012-10-04 DIAGNOSIS — Z0000 Encounter for general adult medical examination without abnormal findings: Secondary | ICD-10-CM

## 2012-10-04 MED ORDER — PROMETHAZINE HCL 12.5 MG PO TABS: 12.5000 mg | ORAL_TABLET | Freq: Four times a day (QID) | ORAL | Status: DC | PRN

## 2012-10-04 NOTE — Progress Notes (Signed)
PT HERE TO ESTABLISH CARE AND C/O LEFT UPPER ARM MOLE THAT HAS CHANGED TO BLACK.NO REDNESS OR SWELLING SEEN.VSS

## 2012-10-04 NOTE — Patient Instructions (Signed)
Smoking Cessation Quitting smoking is important to your health and has many advantages. However, it is not always easy to quit since nicotine is a very addictive drug. Often times, people try 3 times or more before being able to quit. This document explains the best ways for you to prepare to quit smoking. Quitting takes hard work and a lot of effort, but you can do it. ADVANTAGES OF QUITTING SMOKING  You will live longer, feel better, and live better.  Your body will feel the impact of quitting smoking almost immediately.  Within 20 minutes, blood pressure decreases. Your pulse returns to its normal level.  After 8 hours, carbon monoxide levels in the blood return to normal. Your oxygen level increases.  After 24 hours, the chance of having a heart attack starts to decrease. Your breath, hair, and body stop smelling like smoke.  After 48 hours, damaged nerve endings begin to recover. Your sense of taste and smell improve.  After 72 hours, the body is virtually free of nicotine. Your bronchial tubes relax and breathing becomes easier.  After 2 to 12 weeks, lungs can hold more air. Exercise becomes easier and circulation improves.  The risk of having a heart attack, stroke, cancer, or lung disease is greatly reduced.  After 1 year, the risk of coronary heart disease is cut in half.  After 5 years, the risk of stroke falls to the same as a nonsmoker.  After 10 years, the risk of lung cancer is cut in half and the risk of other cancers decreases significantly.  After 15 years, the risk of coronary heart disease drops, usually to the level of a nonsmoker.  If you are pregnant, quitting smoking will improve your chances of having a healthy baby.  The people you live with, especially any children, will be healthier.  You will have extra money to spend on things other than cigarettes. QUESTIONS TO THINK ABOUT BEFORE ATTEMPTING TO QUIT You may want to talk about your answers with your  caregiver.  Why do you want to quit?  If you tried to quit in the past, what helped and what did not?  What will be the most difficult situations for you after you quit? How will you plan to handle them?  Who can help you through the tough times? Your family? Friends? A caregiver?  What pleasures do you get from smoking? What ways can you still get pleasure if you quit? Here are some questions to ask your caregiver:  How can you help me to be successful at quitting?  What medicine do you think would be best for me and how should I take it?  What should I do if I need more help?  What is smoking withdrawal like? How can I get information on withdrawal? GET READY  Set a quit date.  Change your environment by getting rid of all cigarettes, ashtrays, matches, and lighters in your home, car, or work. Do not let people smoke in your home.  Review your past attempts to quit. Think about what worked and what did not. GET SUPPORT AND ENCOURAGEMENT You have a better chance of being successful if you have help. You can get support in many ways.  Tell your family, friends, and co-workers that you are going to quit and need their support. Ask them not to smoke around you.  Get individual, group, or telephone counseling and support. Programs are available at local hospitals and health centers. Call your local health department for   information about programs in your area.  Spiritual beliefs and practices may help some smokers quit.  Download a "quit meter" on your computer to keep track of quit statistics, such as how long you have gone without smoking, cigarettes not smoked, and money saved.  Get a self-help book about quitting smoking and staying off of tobacco. LEARN NEW SKILLS AND BEHAVIORS  Distract yourself from urges to smoke. Talk to someone, go for a walk, or occupy your time with a task.  Change your normal routine. Take a different route to work. Drink tea instead of coffee.  Eat breakfast in a different place.  Reduce your stress. Take a hot bath, exercise, or read a book.  Plan something enjoyable to do every day. Reward yourself for not smoking.  Explore interactive web-based programs that specialize in helping you quit. GET MEDICINE AND USE IT CORRECTLY Medicines can help you stop smoking and decrease the urge to smoke. Combining medicine with the above behavioral methods and support can greatly increase your chances of successfully quitting smoking.  Nicotine replacement therapy helps deliver nicotine to your body without the negative effects and risks of smoking. Nicotine replacement therapy includes nicotine gum, lozenges, inhalers, nasal sprays, and skin patches. Some may be available over-the-counter and others require a prescription.  Antidepressant medicine helps people abstain from smoking, but how this works is unknown. This medicine is available by prescription.  Nicotinic receptor partial agonist medicine simulates the effect of nicotine in your brain. This medicine is available by prescription. Ask your caregiver for advice about which medicines to use and how to use them based on your health history. Your caregiver will tell you what side effects to look out for if you choose to be on a medicine or therapy. Carefully read the information on the package. Do not use any other product containing nicotine while using a nicotine replacement product.  RELAPSE OR DIFFICULT SITUATIONS Most relapses occur within the first 3 months after quitting. Do not be discouraged if you start smoking again. Remember, most people try several times before finally quitting. You may have symptoms of withdrawal because your body is used to nicotine. You may crave cigarettes, be irritable, feel very hungry, cough often, get headaches, or have difficulty concentrating. The withdrawal symptoms are only temporary. They are strongest when you first quit, but they will go away within  10 14 days. To reduce the chances of relapse, try to:  Avoid drinking alcohol. Drinking lowers your chances of successfully quitting.  Reduce the amount of caffeine you consume. Once you quit smoking, the amount of caffeine in your body increases and can give you symptoms, such as a rapid heartbeat, sweating, and anxiety.  Avoid smokers because they can make you want to smoke.  Do not let weight gain distract you. Many smokers will gain weight when they quit, usually less than 10 pounds. Eat a healthy diet and stay active. You can always lose the weight gained after you quit.  Find ways to improve your mood other than smoking. FOR MORE INFORMATION  www.smokefree.gov  Document Released: 03/22/2001 Document Revised: 09/27/2011 Document Reviewed: 07/07/2011 [employer] Patient Information 2014 [employer].  

## 2012-10-04 NOTE — Progress Notes (Signed)
Patient ID: Kendra Griffith, female   DOB: Jan 24, 1966, 47 y.o.   MRN: [id number]   CC:  Foot pain, poor dentition, suspicious looking mole  HPI: Patient is a 47 year old female with past medical history of an unspecified condition involving her feet that has required remote surgical intervention. She was last seen on 09/24/2012 with a referral to a local podiatrist for callus shaving. She also had foot films that were unremarkable. Patient claims that she saw a sports medicine physician who referred her to [facility] but she is uncertain she will be able to keep the appointment secondary to difficulty with travel arrangements. Although the tramadol has helped her pain, she gets nauseated when she takes it. She also is requesting a referral to a dentist and a dermatologist. She reports a mole on her left upper arm that has changed. It started out as a red bump but over the past year, it has turned black and enlarged.  Allergies  Allergen Reactions  . Darvocet (Propoxyphene-Acetaminophen)     Makes her jittery  . Hydrocodone-Acetaminophen     REACTION: Upset stomach  . Percocet (Oxycodone-Acetaminophen)     Makes her jittery  . Valium (Diazepam) Other (See Comments)    UNKNOWN Makes pt. Feel "out of wack"   Past Medical History  Diagnosis Date  . Anxiety   . Depression   . Hypertension   . Anemia    Current Outpatient Prescriptions on File Prior to Visit  Medication Sig Dispense Refill  . famotidine (PEPCID) 20 MG tablet Take 1 tablet (20 mg total) by mouth 2 (two) times daily. For Reflux  60 tablet  0  . FLUoxetine (PROZAC) 10 MG capsule Take 1 capsule (10 mg total) by mouth at bedtime. For depression  30 capsule  0  . traMADol (ULTRAM) 50 MG tablet Take 1 tablet (50 mg total) by mouth every 8 (eight) hours as needed for pain.  40 tablet  0   No current facility-administered medications on file prior to visit.   Family History  Problem Relation Age of Onset  . Heart disease Mother   .  Asthma Sister    History   Social History  . Marital Status: Divorced    Spouse Name: N/A    Number of Children: N/A  . Years of Education: N/A   Occupational History  . Not on file.   Social History Main Topics  . Smoking status: Current Every Day Smoker -- 2.00 packs/day for 29 years    Types: Cigarettes  . Smokeless tobacco: Never Used     Comment: smokes 1-2 ppd  . Alcohol Use: No     Comment: Quit drinking in 2012.  . Drug Use: No  . Sexually Active: Yes    Birth Control/ Protection: None   Other Topics Concern  . Not on file   Social History Narrative  . No narrative on file    Review of Systems: Constitutional: No fever or chills;  Appetite normal; No weight loss.  HEENT: No blurry vision or diplopia, no pharyngitis or dysphagia CV: No chest pain or arrhythmia.  Resp: No SOB, no cough. GI: No N/V, no diarrhea, no melena or hematochezia.  GU: No dysuria or hematuria.  MSK: no myalgias/arthralgias, + foot pain.  Neuro:  No headache or focal neurological deficits.  Psych: No depression or anxiety.  Endo: No thyroid disease or DM.  Skin: No rashes, + lesion.  Heme: No anemia or blood dyscrasia   Objective:  Filed Vitals:   10/04/12 1114  BP: 109/78  Pulse: 69  Temp: 99 F (37.2 C)  Resp: 16    Physical Exam  Constitutional: Appears well-developed and well-nourished. No distress.  HENT: Normocephalic. External right and left ear normal. Oropharynx is clear and moist.  Eyes: Conjunctivae and EOM are normal. PERRLA, no scleral icterus.  Neck: Normal ROM. Neck supple. No JVD. No tracheal deviation. No thyromegaly.  CVS: RRR, S1/S2 +, no murmurs, no gallops, no carotid bruit.  Pulmonary: Effort and breath sounds normal, no stridor, rhonchi, wheezes, rales.  Abdominal: Soft. BS +,  no distension, tenderness, rebound or guarding. Musculoskeletal: Normal range of motion. No edema and no tenderness.  Neuro: Alert. Normal reflexes, muscle tone coordination. No cranial  nerve deficit. Skin: Skin is warm and dry. No rash noted. Not diaphoretic. No erythema. No pallor.  Psychiatric: Normal mood and affect. Behavior, judgment, thought content normal.   Lab Results  Component Value Date   WBC 4.9 10/23/2011   HGB 11.7* 10/23/2011   HCT 34.4* 10/23/2011   MCV 88.0 10/23/2011   PLT 247 10/23/2011   Lab Results  Component Value Date   CREATININE 0.69 10/23/2011   BUN 11 10/23/2011   NA 144 10/23/2011   K 3.6 10/23/2011   CL 104 10/23/2011   CO2 26 10/23/2011    No results found for this basename: HGBA1C   Lipid Panel     Component Value Date/Time   CHOL 206* 12/08/2009 2213   TRIG 58 12/08/2009 2213   HDL 107 12/08/2009 2213   CHOLHDL 1.9 Ratio 12/08/2009 2213   VLDL 12 12/08/2009 2213   LDLCALC 87 12/08/2009 2213       Assessment and plan:  1. Bilateral foot pain: Has followup at [facility]. Continue tramadol. Add Phenergan 30 minutes prior to tramadol dosing to help assist with controlling nausea. 2. Poor dentition: Dental referral made. 3. Left upper arm: Dermatology referral made. 4. Routine health maintenance: Up to date on Pap and mammograms. Check fasting lipid panel.   Return to the clinic: 3 months.  Signed:  Dr. Trula Ore Amyrie Illingworth 10/04/2012 1:50 PM

## 2012-10-05 LAB — COMPREHENSIVE METABOLIC PANEL
AST: 14 U/L (ref 0–37)
Alkaline Phosphatase: 77 U/L (ref 39–117)
BUN: 11 mg/dL (ref 6–23)
Creat: 0.73 mg/dL (ref 0.50–1.10)
Glucose, Bld: 96 mg/dL (ref 70–99)
Total Bilirubin: 0.5 mg/dL (ref 0.3–1.2)

## 2012-10-05 LAB — LIPID PANEL
Cholesterol: 153 mg/dL (ref 0–200)
HDL: 49 mg/dL (ref >39)
Total CHOL/HDL Ratio: 3.1 Ratio
VLDL: 12 mg/dL (ref 0–40)

## 2012-10-10 NOTE — Progress Notes (Signed)
Quick Note:  Lipids under good control. Please call/send letter to advise patient of her good results.  Kendra Griffith 10/10/2012 3:05 PM  ______

## 2012-10-17 ENCOUNTER — Telehealth: Payer: Self-pay

## 2012-10-17 NOTE — Telephone Encounter (Signed)
Patient informed of lab results. 

## 2012-10-17 NOTE — Telephone Encounter (Signed)
Message copied by Lestine Mount on Wed Oct 17, 2012 12:04 PM ------      Message from: RAMA, Trula Ore P      Created: Wed Oct 10, 2012  3:05 PM       Lipids under good control.  Please call/send letter to advise patient of her good results.            RAMA,CHRISTINA      10/10/2012      3:05 PM       ------

## 2012-10-22 ENCOUNTER — Ambulatory Visit: Payer: No Typology Code available for payment source | Admitting: Sports Medicine

## 2012-10-29 ENCOUNTER — Telehealth: Payer: Self-pay | Admitting: Family Medicine

## 2012-10-29 NOTE — Telephone Encounter (Signed)
Dr. Daphine Deutscher from [facility] called to give follow-up info.  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 If there are any questions call Dr Daphine Deutscher back at [phone].

## 2012-12-18 ENCOUNTER — Encounter (HOSPITAL_COMMUNITY): Payer: Self-pay

## 2012-12-18 ENCOUNTER — Ambulatory Visit (HOSPITAL_COMMUNITY)
Admission: RE | Admit: 2012-12-18 | Discharge: 2012-12-18 | Disposition: A | Discharge status: Home / Self Care | Payer: No Typology Code available for payment source | Source: Ambulatory Visit | Attending: Obstetrics and Gynecology | Admitting: Obstetrics and Gynecology

## 2012-12-18 ENCOUNTER — Other Ambulatory Visit: Payer: Self-pay | Admitting: Obstetrics and Gynecology

## 2012-12-18 VITALS — BP 138/100 | Temp 98.9°F | Ht 63.0 in | Wt 163.0 lb

## 2012-12-18 DIAGNOSIS — N898 Other specified noninflammatory disorders of vagina: Secondary | ICD-10-CM

## 2012-12-18 DIAGNOSIS — Z01419 Encounter for gynecological examination (general) (routine) without abnormal findings: Secondary | ICD-10-CM

## 2012-12-18 NOTE — Addendum Note (Signed)
Encounter addended by: Lurlean Horns, LPN on: 04/16/1094  3:59 PM<BR>     Documentation filed: Orders

## 2012-12-18 NOTE — Patient Instructions (Addendum)
Informed patient that [facility] will cover Pap smears every 3 years unless has a history of abnormal Pap smears. Let patient know will follow up with her within the next couple weeks with results by phone. Let patient know that if wet prep comes back abnormal that [facility] does not cover the cost of the medications. Smoking cessation discussed with patient and resources given. Patient complained of dark colored urine. Patient's PCP is at [facility]. Encouraged patient to follow up with [facility] for concerns with urine.  Kendra Griffith verbalized understanding.  Kendra Griffith, Kathaleen Maser, RN 1:19 PM

## 2012-12-18 NOTE — Progress Notes (Signed)
No complaints today.  Pap Smear:    Pap smear completed today. Patients last Pap smear was 12/08/2009 and normal. Per patient she has no history of abnormal Pap smears. Pap smear result above is in EPIC.     Pelvic/Bimanual   Ext Genitalia No lesions, no swelling and no discharge observed on external genitalia.         Vagina Vagina slightly red and normal texture. No lesions and large amount of thick white discharge with odor observed in vagina. Wet prep completed.         Cervix Cervix is present. Cervix slightly red and of normal texture. Thick white discharge observed on cervix.    Uterus Uterus is present and palpable. Uterus in normal position and normal size.        Adnexae Bilateral ovaries present and palpable. No tenderness on palpation.          Rectovaginal No rectal exam completed today since patient had no rectal complaints. No skin abnormalities observed on exam.

## 2012-12-19 LAB — WET PREP, GENITAL
Trich, Wet Prep: NONE SEEN
WBC, Wet Prep HPF POC: NONE SEEN

## 2012-12-26 ENCOUNTER — Ambulatory Visit: Payer: No Typology Code available for payment source

## 2012-12-26 ENCOUNTER — Other Ambulatory Visit: Payer: Self-pay | Admitting: *Deleted

## 2012-12-26 DIAGNOSIS — A599 Trichomoniasis, unspecified: Secondary | ICD-10-CM

## 2012-12-26 DIAGNOSIS — B9689 Other specified bacterial agents as the cause of diseases classified elsewhere: Secondary | ICD-10-CM

## 2012-12-26 MED ORDER — METRONIDAZOLE 500 MG PO TABS: 500.0000 mg | ORAL_TABLET | Freq: Two times a day (BID) | ORAL | Status: DC

## 2012-12-26 NOTE — Progress Notes (Signed)
Telephoned patient at home # and discussed negative pap smear. Next pap due in 3 years. Also discussed wet prep which showed BV and also pap showed trichomonas. Advised med called in to pharmacy. No alcohol while taking medication and to finish all medication. If has sexual partner also needs to be treated. Patient voiced understanding.

## 2013-01-04 ENCOUNTER — Ambulatory Visit: Payer: No Typology Code available for payment source | Admitting: Family Medicine

## 2013-01-07 ENCOUNTER — Ambulatory Visit: Payer: No Typology Code available for payment source

## 2013-01-30 ENCOUNTER — Telehealth (HOSPITAL_COMMUNITY): Payer: Self-pay | Admitting: *Deleted

## 2013-01-30 ENCOUNTER — Other Ambulatory Visit: Payer: Self-pay | Admitting: Internal Medicine

## 2013-01-30 DIAGNOSIS — Z1231 Encounter for screening mammogram for malignant neoplasm of breast: Secondary | ICD-10-CM

## 2013-01-30 NOTE — Telephone Encounter (Signed)
Telephoned patient at home # and left message to return call to [facility] 

## 2013-02-01 ENCOUNTER — Encounter: Payer: Self-pay | Admitting: Internal Medicine

## 2013-02-01 ENCOUNTER — Other Ambulatory Visit: Payer: Self-pay | Admitting: Obstetrics and Gynecology

## 2013-02-01 ENCOUNTER — Ambulatory Visit: Payer: No Typology Code available for payment source | Attending: Internal Medicine

## 2013-02-01 ENCOUNTER — Ambulatory Visit: Payer: No Typology Code available for payment source | Attending: Internal Medicine | Admitting: Internal Medicine

## 2013-02-01 VITALS — BP 138/101 | HR 72 | Temp 98.8°F | Resp 16 | Ht 66.0 in | Wt 165.0 lb

## 2013-02-01 DIAGNOSIS — M25579 Pain in unspecified ankle and joints of unspecified foot: Secondary | ICD-10-CM | POA: Insufficient documentation

## 2013-02-01 DIAGNOSIS — Z1231 Encounter for screening mammogram for malignant neoplasm of breast: Secondary | ICD-10-CM

## 2013-02-01 DIAGNOSIS — M25571 Pain in right ankle and joints of right foot: Secondary | ICD-10-CM

## 2013-02-01 DIAGNOSIS — K089 Disorder of teeth and supporting structures, unspecified: Secondary | ICD-10-CM

## 2013-02-01 DIAGNOSIS — I10 Essential (primary) hypertension: Secondary | ICD-10-CM

## 2013-02-01 LAB — CBC WITH DIFFERENTIAL/PLATELET
Basophils Absolute: 0.1 10*3/uL (ref 0.0–0.1)
Basophils Relative: 2 % — ABNORMAL HIGH (ref 0–1)
Eosinophils Relative: 3 % (ref 0–5)
HCT: 34.1 % — ABNORMAL LOW (ref 36.0–46.0)
Hemoglobin: 11.3 g/dL — ABNORMAL LOW (ref 12.0–15.0)
MCHC: 33.1 g/dL (ref 30.0–36.0)
MCV: 80.4 fL (ref 78.0–100.0)
Monocytes Absolute: 0.6 10*3/uL (ref 0.1–1.0)
Monocytes Relative: 9 % (ref 3–12)
Neutro Abs: 2.9 10*3/uL (ref 1.7–7.7)
Platelets: 210 10*3/uL (ref 150–400)

## 2013-02-01 LAB — CMP AND LIVER
ALT: 9 U/L (ref 0–35)
AST: 13 U/L (ref 0–37)
Albumin: 4.4 g/dL (ref 3.5–5.2)
Alkaline Phosphatase: 93 U/L (ref 39–117)
Creat: 0.86 mg/dL (ref 0.50–1.10)
Glucose, Bld: 100 mg/dL — ABNORMAL HIGH (ref 70–99)
Potassium: 4.7 mEq/L (ref 3.5–5.3)
Sodium: 141 mEq/L (ref 135–145)
Total Bilirubin: 0.3 mg/dL (ref 0.3–1.2)
Total Protein: 6.8 g/dL (ref 6.0–8.3)

## 2013-02-01 MED ORDER — ACETAMINOPHEN-CODEINE #3 300-30 MG PO TABS: 1.0000 | ORAL_TABLET | ORAL | Status: DC | PRN

## 2013-02-01 MED ORDER — HYDROCHLOROTHIAZIDE 12.5 MG PO TABS: 12.5000 mg | ORAL_TABLET | Freq: Every day | ORAL | Status: DC

## 2013-02-01 NOTE — Patient Instructions (Addendum)
Exercise to Stay Healthy Exercise helps you become and stay healthy. EXERCISE IDEAS AND TIPS Choose exercises that:  You enjoy.  Fit into your day. You do not need to exercise really hard to be healthy. You can do exercises at a slow or medium level and stay healthy. You can:  Stretch before and after working out.  Try yoga, Pilates, or tai chi.  Lift weights.  Walk fast, swim, jog, run, climb stairs, bicycle, dance, or rollerskate.  Take aerobic classes. Exercises that burn about 150 calories:  Running 1  miles in 15 minutes.  Playing volleyball for 45 to 60 minutes.  Washing and waxing a car for 45 to 60 minutes.  Playing touch football for 45 minutes.  Walking 1  miles in 35 minutes.  Pushing a stroller 1  miles in 30 minutes.  Playing basketball for 30 minutes.  Raking leaves for 30 minutes.  Bicycling 5 miles in 30 minutes.  Walking 2 miles in 30 minutes.  Dancing for 30 minutes.  Shoveling snow for 15 minutes.  Swimming laps for 20 minutes.  Walking up stairs for 15 minutes.  Bicycling 4 miles in 15 minutes.  Gardening for 30 to 45 minutes.  Jumping rope for 15 minutes.  Washing windows or floors for 45 to 60 minutes. Document Released: 04/30/2010 Document Revised: 06/20/2011 Document Reviewed: 04/30/2010 [facility] Patient Information 2014 [place], [place]. Hypertension As your heart beats, it forces blood through your arteries. This force is your blood pressure. If the pressure is too high, it is called hypertension (HTN) or high blood pressure. HTN is dangerous because you may have it and not know it. High blood pressure may mean that your heart has to work harder to pump blood. Your arteries may be narrow or stiff. The extra work puts you at risk for heart disease, stroke, and other problems.  Blood pressure consists of two numbers, a higher number over a lower, 110/72, for example. It is stated as "110 over 72." The ideal is below 120 for the top  number (systolic) and under 80 for the bottom (diastolic). Write down your blood pressure today. You should pay close attention to your blood pressure if you have certain conditions such as:  Heart failure.  Prior heart attack.  Diabetes  Chronic kidney disease.  Prior stroke.  Multiple risk factors for heart disease. To see if you have HTN, your blood pressure should be measured while you are seated with your arm held at the level of the heart. It should be measured at least twice. A one-time elevated blood pressure reading (especially in the Emergency Department) does not mean that you need treatment. There may be conditions in which the blood pressure is different between your right and left arms. It is important to see your caregiver soon for a recheck. Most people have essential hypertension which means that there is not a specific cause. This type of high blood pressure may be lowered by changing lifestyle factors such as:  Stress.  Smoking.  Lack of exercise.  Excessive weight.  Drug/tobacco/alcohol use.  Eating less salt. Most people do not have symptoms from high blood pressure until it has caused damage to the body. Effective treatment can often prevent, delay or reduce that damage. TREATMENT  When a cause has been identified, treatment for high blood pressure is directed at the cause. There are a large number of medications to treat HTN. These fall into several categories, and your caregiver will help you select the medicines  that are best for you. Medications may have side effects. You should review side effects with your caregiver. If your blood pressure stays high after you have made lifestyle changes or started on medicines,   Your medication(s) may need to be changed.  Other problems may need to be addressed.  Be certain you understand your prescriptions, and know how and when to take your medicine.  Be sure to follow up with your caregiver within the time frame  advised (usually within two weeks) to have your blood pressure rechecked and to review your medications.  If you are taking more than one medicine to lower your blood pressure, make sure you know how and at what times they should be taken. Taking two medicines at the same time can result in blood pressure that is too low. SEEK IMMEDIATE MEDICAL CARE IF:  You develop a severe headache, blurred or changing vision, or confusion.  You have unusual weakness or numbness, or a faint feeling.  You have severe chest or abdominal pain, vomiting, or breathing problems. MAKE SURE YOU:   Understand these instructions.  Will watch your condition.  Will get help right away if you are not doing well or get worse. Document Released: 03/28/2005 Document Revised: 06/20/2011 Document Reviewed: 11/16/2007 [facility] Patient Information 2014 [place], [place]. DASH Diet The DASH diet stands for "Dietary Approaches to Stop Hypertension." It is a healthy eating plan that has been shown to reduce high blood pressure (hypertension) in as little as 14 days, while also possibly providing other significant health benefits. These other health benefits include reducing the risk of breast cancer after menopause and reducing the risk of type 2 diabetes, heart disease, colon cancer, and stroke. Health benefits also include weight loss and slowing kidney failure in patients with chronic kidney disease.  DIET GUIDELINES  Limit salt (sodium). Your diet should contain less than 1500 mg of sodium daily.  Limit refined or processed carbohydrates. Your diet should include mostly whole grains. Desserts and added sugars should be used sparingly.  Include small amounts of heart-healthy fats. These types of fats include nuts, oils, and tub margarine. Limit saturated and trans fats. These fats have been shown to be harmful in the body. CHOOSING FOODS  The following food groups are based on a 2000 calorie diet. See your Registered  Dietitian for individual calorie needs. Grains and Grain Products (6 to 8 servings daily)  Eat More Often: Whole-wheat bread, brown rice, whole-grain or wheat pasta, quinoa, popcorn without added fat or salt (air popped).  Eat Less Often: White bread, white pasta, white rice, cornbread. Vegetables (4 to 5 servings daily)  Eat More Often: Fresh, frozen, and canned vegetables. Vegetables may be raw, steamed, roasted, or grilled with a minimal amount of fat.  Eat Less Often/Avoid: Creamed or fried vegetables. Vegetables in a cheese sauce. Fruit (4 to 5 servings daily)  Eat More Often: All fresh, canned (in natural juice), or frozen fruits. Dried fruits without added sugar. One hundred percent fruit juice ( cup [237 mL] daily).  Eat Less Often: Dried fruits with added sugar. Canned fruit in light or heavy syrup. [employer], Fish, and Poultry (2 servings or less daily. One serving is 3 to 4 oz [85-114 g]).  Eat More Often: Ninety percent or leaner ground beef, tenderloin, sirloin. Round cuts of beef, chicken breast, [place] breast. All fish. Grill, bake, or broil your meat. Nothing should be fried.  Eat Less Often/Avoid: Fatty cuts of meat, [place], or chicken leg, thigh,  or wing. Fried cuts of meat or fish. Dairy (2 to 3 servings)  Eat More Often: Low-fat or fat-free milk, low-fat plain or light yogurt, reduced-fat or part-skim cheese.  Eat Less Often/Avoid: Milk (whole, 2%).Whole milk yogurt. Full-fat cheeses. Nuts, Seeds, and Legumes (4 to 5 servings per week)  Eat More Often: All without added salt.  Eat Less Often/Avoid: Salted nuts and seeds, canned beans with added salt. Fats and Sweets (limited)  Eat More Often: Vegetable oils, tub margarines without trans fats, sugar-free gelatin. Mayonnaise and salad dressings.  Eat Less Often/Avoid: Coconut oils, palm oils, butter, stick margarine, cream, half and half, cookies, candy, pie. FOR MORE INFORMATION The Dash Diet Eating Plan:  www.dashdiet.org Document Released: 03/17/2011 Document Revised: 06/20/2011 Document Reviewed: 03/17/2011 [facility] Patient Information 2014 [place], [place].

## 2013-02-01 NOTE — Progress Notes (Signed)
Pt is here for a f/u visit. Pt is here to get a medication refill. Pt is requesting a kidney and liver function test. Pt reports that her fot pain is still a constant 10.  Pt stated that she is taking her sisters Gabapentin.

## 2013-02-01 NOTE — Progress Notes (Signed)
Patient ID: Kendra Griffith, female   DOB: 08/03/1965, 47 y.o.   MRN: [id number] Patient Demographics  Kendra Griffith, is a 47 y.o. female  [id number]  [id number]  DOB - 03-03-1966  Chief Complaint  Patient presents with  . Follow-up        Subjective:   [name] is a 47 y.o. female here today for a follow up visit. Patient claims she continues to have pain in both feet. She has had a podiatrist who diagnosed her with neuropathy and placed her on gabapentin. She could not afford the medication when she has started taking her sister's gabapentin. She is requesting an additional pain medication. She continues to smoke cigarette. She has been told before that she has high blood pressure, never started on medications. Patient has No headache, No chest pain, No abdominal pain - No Nausea, No new weakness tingling or numbness, No Cough - SOB.  ALLERGIES: Allergies  Allergen Reactions  . Darvocet [Propoxyphene-Acetaminophen]     Makes her jittery  . Hydrocodone-Acetaminophen     REACTION: Upset stomach  . Percocet [Oxycodone-Acetaminophen]     Makes her jittery  . Valium [Diazepam] Other (See Comments)    UNKNOWN Makes pt. Feel "out of wack"    PAST MEDICAL HISTORY: Past Medical History  Diagnosis Date  . Anxiety   . Depression   . Hypertension   . Anemia     MEDICATIONS AT HOME: Prior to Admission medications   Medication Sig Start Date End Date Taking? Authorizing Provider  acetaminophen-codeine (TYLENOL #3) 300-30 MG per tablet Take 1 tablet by mouth every 4 (four) hours as needed for pain. 02/01/13   Jeanann Lewandowsky, MD  famotidine (PEPCID) 20 MG tablet Take 1 tablet (20 mg total) by mouth 2 (two) times daily. For Reflux 10/11/11   Alyson Kuroski-Mazzei, DO  FLUoxetine (PROZAC) 10 MG capsule Take 1 capsule (10 mg total) by mouth at bedtime. For depression 10/11/11 10/10/12  Alyson Kuroski-Mazzei, DO  metroNIDAZOLE (FLAGYL) 500 MG tablet Take 1 tablet (500 mg total)  by mouth 2 (two) times daily. 12/26/12   Peggy Constant, MD  promethazine (PHENERGAN) 12.5 MG tablet Take 1 tablet (12.5 mg total) by mouth every 6 (six) hours as needed (Take 30 minutes prior to Tramadol to help prevent nausea.). 10/04/12   Maryruth Bun Rama, MD  traMADol (ULTRAM) 50 MG tablet Take 1 tablet (50 mg total) by mouth every 8 (eight) hours as needed for pain. 08/22/12   Ralene Cork, DO     Objective:   Filed Vitals:   02/01/13 1143  BP: 138/101  Pulse: 72  Temp: 98.8 F (37.1 C)  TempSrc: Oral  Resp: 16  Height: 5\' 6"  (1.676 m)  Weight: 165 lb (74.844 kg)  SpO2: 100%    Exam General appearance : Awake, alert, not in any distress. Speech Clear. Not toxic looking HEENT: Atraumatic and Normocephalic, pupils equally reactive to light and accomodation Neck: supple, no JVD. No cervical lymphadenopathy.  Chest:Good air entry bilaterally, no added sounds  CVS: S1 S2 regular, no murmurs.  Abdomen: Bowel sounds present, Non tender and not distended with no gaurding, rigidity or rebound. Extremities: B/L Lower Ext shows no edema, both legs are warm to touch Neurology: Awake alert, and oriented X 3, CN II-XII intact, Non focal Skin:No Rash Wounds:N/A   Data Review   CBC No results found for this basename: WBC, HGB, HCT, PLT, MCV, MCH, MCHC, RDW, NEUTRABS, LYMPHSABS, MONOABS, EOSABS, BASOSABS, BANDABS, BANDSABD,  in  the last 168 hours  Chemistries   No results found for this basename: NA, K, CL, CO2, GLUCOSE, BUN, CREATININE, GFRCGP, CALCIUM, MG, AST, ALT, ALKPHOS, BILITOT,  in the last 168 hours ------------------------------------------------------------------------------------------------------------------ No results found for this basename: HGBA1C,  in the last 72 hours ------------------------------------------------------------------------------------------------------------------ No results found for this basename: CHOL, HDL, LDLCALC, TRIG, CHOLHDL, LDLDIRECT,  in  the last 72 hours ------------------------------------------------------------------------------------------------------------------ No results found for this basename: TSH, T4TOTAL, FREET3, T3FREE, THYROIDAB,  in the last 72 hours ------------------------------------------------------------------------------------------------------------------ No results found for this basename: VITAMINB12, FOLATE, FERRITIN, TIBC, IRON, RETICCTPCT,  in the last 72 hours  Coagulation profile  No results found for this basename: INR, PROTIME,  in the last 168 hours    Assessment & Plan   Patient Active Problem List   Diagnosis Date Noted  . Poor dentition 02/01/2013  . Pain in joint, ankle and foot 02/01/2013  . FOOT PAIN, BILATERAL 06/16/2010  . RECTAL BLEEDING 12/08/2009  . HEMATURIA UNSPECIFIED 12/08/2009  . DEPRESSION 08/20/2009  . ABSCESS, TOOTH 08/20/2009  . HYPERLIPIDEMIA 07/30/2009  . SICKLE-CELL TRAIT 07/30/2009  . ANEMIA-NOS 07/30/2009  . ALCOHOLISM 07/30/2009  . HYPERTENSION 07/30/2009  . GERD 07/30/2009  . CALLUS, FOOT 07/30/2009     Plan: Add Tylenol No. 3 one tablet by mouth 4 times a day when necessary pain Patient has been counseled extensively about pain Patient encouraged to continue gabapentin as prescribed  Hypertension Start hydrochlorothiazide 12.5 mg tablet by mouth daily   Patient has been counseled extensively about smoking cessation Patient has been counseled about nutrition and exercise  Labs today: Comprehensive metabolic panel Complete blood count and depressions HIV-antibody on patient's request  Health Maintenance -Colonoscopy: Not indicated at this time -Pap Smear: Done recently, Normal -Mammogram: Done recently, Normal -Vaccinations:  -Influenza: Given today  Follow up in 3 months when necessary  The patient was given clear instructions to go to ER or return to medical center if symptoms don't improve, worsen or new problems develop. The  patient verbalized understanding. The patient was told to call to get lab results if they haven't heard anything in the next week.    Jeanann Lewandowsky, MD, MHA, FACP, FAAP [facility] [place], [place] [phone]   02/01/2013, 12:13 PM

## 2013-02-06 ENCOUNTER — Telehealth: Payer: Self-pay

## 2013-02-06 NOTE — Telephone Encounter (Signed)
Patient is aware of her lab results We talked about OTC iron and where to obtain it

## 2013-02-06 NOTE — Telephone Encounter (Signed)
Message copied by Lestine Mount on Wed Feb 06, 2013  3:16 PM ------      Message from: Jeanann Lewandowsky E      Created: Wed Feb 06, 2013 11:50 AM       Please let patient know that most of his lab results come back normal, hemoglobin is slightly low as it was in the past, continue iron tablets over-the-counter ------

## 2013-02-12 ENCOUNTER — Telehealth: Payer: Self-pay | Admitting: Emergency Medicine

## 2013-02-12 ENCOUNTER — Telehealth: Payer: Self-pay | Admitting: Internal Medicine

## 2013-02-12 ENCOUNTER — Other Ambulatory Visit: Payer: Self-pay | Admitting: Emergency Medicine

## 2013-02-12 DIAGNOSIS — I10 Essential (primary) hypertension: Secondary | ICD-10-CM

## 2013-02-12 MED ORDER — HYDROCHLOROTHIAZIDE 12.5 MG PO TABS: 12.5000 mg | ORAL_TABLET | Freq: Every day | ORAL | Status: DC

## 2013-02-12 MED ORDER — IBUPROFEN 800 MG PO TABS: 800.0000 mg | ORAL_TABLET | Freq: Three times a day (TID) | ORAL | Status: DC | PRN

## 2013-02-12 NOTE — Telephone Encounter (Signed)
Yes, please switch

## 2013-02-12 NOTE — Addendum Note (Signed)
Addended by: Nonnie Done D on: 02/12/2013 05:39 PM   Modules accepted: Orders

## 2013-02-12 NOTE — Telephone Encounter (Signed)
PT called regarding a refill of her blood pressure medication, Also pt would like to know if it is okay for her to take Motrin 600, please contact pt

## 2013-02-12 NOTE — Telephone Encounter (Signed)
Left message for pt to call clinic back regarding BP refill.  BP med was ordered on 02/01/13 by Dr. Hyman Hopes. Pt also informed not to take Motrin 600 mg if taking tylenol #3

## 2013-02-12 NOTE — Telephone Encounter (Signed)
Spoke with pt. Can pick up Ibuprofen from preferred pharmacy

## 2013-02-12 NOTE — Telephone Encounter (Signed)
Spoke with pt regarding medication HCTZ sent to [facility] due to daughter taking script accidentally out of town. Script transferred. Pt also requesting Ibuprofen instead of Tylenol # 3 due to past addiction. Can we switch?

## 2013-03-05 ENCOUNTER — Ambulatory Visit (HOSPITAL_COMMUNITY)
Admission: RE | Admit: 2013-03-05 | Discharge: 2013-03-05 | Disposition: A | Discharge status: Home / Self Care | Payer: No Typology Code available for payment source | Source: Ambulatory Visit | Attending: Obstetrics and Gynecology | Admitting: Obstetrics and Gynecology

## 2013-03-05 ENCOUNTER — Ambulatory Visit (HOSPITAL_COMMUNITY): Payer: Self-pay

## 2013-03-05 ENCOUNTER — Encounter (HOSPITAL_COMMUNITY): Payer: Self-pay

## 2013-03-05 VITALS — BP 118/72 | Temp 98.6°F | Ht 65.0 in | Wt 161.0 lb

## 2013-03-05 DIAGNOSIS — Z1239 Encounter for other screening for malignant neoplasm of breast: Secondary | ICD-10-CM

## 2013-03-05 DIAGNOSIS — Z1231 Encounter for screening mammogram for malignant neoplasm of breast: Secondary | ICD-10-CM

## 2013-03-05 NOTE — Progress Notes (Signed)
No complaints today.  Pap Smear:  Pap smear not completed today. Last Pap smear was 12/18/2012 at [facility] and normal. Per patient has no history of an abnormal Pap smear. Last Pap smear result is in EPIC.  Physical exam: Breasts Breasts symmetrical. No skin abnormalities bilateral breasts. No nipple retraction bilateral breasts. No nipple discharge bilateral breasts. No lymphadenopathy. No lumps palpated bilateral breasts. No complaints of pain or tenderness on exam. Patient escorted to mammography for a screening mammogram.        Pelvic/Bimanual No Pap smear completed today since last Pap smear was 12/18/2012. Pap smear not indicated per BCCCP guidelines.

## 2013-03-05 NOTE — Patient Instructions (Signed)
Taught [name] how to perform BSE and gave educational materials to take home. Patient did not need a Pap smear today due to last Pap smear was 12/18/2012. Let her know BCCCP will cover Pap smears every 3 years unless has a history of abnormal Pap smears. Let patient know will follow up with her within the next couple weeks with results by letter or phone. [name] verbalized understanding. Patient escorted to mammography for a screening mammogram.  [place], Kathaleen Maser, RN 1:16 PM

## 2013-04-03 ENCOUNTER — Telehealth: Payer: Self-pay

## 2013-04-03 NOTE — Telephone Encounter (Signed)
Error wrong chart

## 2013-05-09 ENCOUNTER — Encounter: Payer: Self-pay | Admitting: Internal Medicine

## 2013-05-09 ENCOUNTER — Ambulatory Visit: Payer: No Typology Code available for payment source | Attending: Internal Medicine | Admitting: Internal Medicine

## 2013-05-09 ENCOUNTER — Ambulatory Visit: Payer: No Typology Code available for payment source | Attending: Internal Medicine

## 2013-05-09 VITALS — BP 127/88 | HR 70 | Temp 98.7°F | Resp 16 | Ht 66.5 in | Wt 163.0 lb

## 2013-05-09 DIAGNOSIS — G609 Hereditary and idiopathic neuropathy, unspecified: Secondary | ICD-10-CM

## 2013-05-09 DIAGNOSIS — G629 Polyneuropathy, unspecified: Secondary | ICD-10-CM | POA: Insufficient documentation

## 2013-05-09 DIAGNOSIS — I10 Essential (primary) hypertension: Secondary | ICD-10-CM | POA: Insufficient documentation

## 2013-05-09 MED ORDER — CYANOCOBALAMIN 250 MCG PO TABS: 250.0000 ug | ORAL_TABLET | Freq: Every day | ORAL | Status: DC

## 2013-05-09 MED ORDER — HYDROCHLOROTHIAZIDE 12.5 MG PO TABS: 12.5000 mg | ORAL_TABLET | Freq: Every day | ORAL | Status: DC

## 2013-05-09 MED ORDER — GABAPENTIN 600 MG PO TABS
600.0000 mg | ORAL_TABLET | Freq: Three times a day (TID) | ORAL | Status: DC
Start: 2013-05-09 — End: 2013-08-08

## 2013-05-09 NOTE — Progress Notes (Signed)
Pt is here following up on her chronic b/l feet pain.

## 2013-05-09 NOTE — Progress Notes (Signed)
Patient ID: Kendra Griffith, female   DOB: [birth year], 48 y.o.   MRN: [id number] Patient Demographics  Kendra Griffith, is a 48 y.o. female  [id number]  [id number]  DOB - [birth year]  Chief Complaint  Patient presents with  . Follow-up        Subjective:   Kendra Griffith is a 48 y.o. female here today for a follow up visit. Patient still complaining of bilateral feet pain preventing her from active ambulation. There is no obvious injury, no swelling. Pain is chronic and nonspecific. Patient continue to smoke cigarette, he used to abuse alcohol but quit. She is seeing a podiatrist in [place]. They have discussed several options available for treatment, patient is not able to afford the surgery on medications prescribed because she has no job no insurance. Patient claims she is depressed but not suicidal, she denies any suicidal ideation or thought today. He claims she has some good days and some bad days, she has a daughter who she is able to talk to mostly on the phone and one friend. She has seen a Education officer, museum here in the past she is willing to continue care with him.  Patient has No headache, No chest pain, No abdominal pain - No Nausea, No new weakness tingling or numbness, No Cough - SOB.  ALLERGIES: Allergies  Allergen Reactions  . Darvocet [Propoxyphene N-Acetaminophen]     Makes her jittery  . Hydrocodone-Acetaminophen     REACTION: Upset stomach  . Percocet [Oxycodone-Acetaminophen]     Makes her jittery  . Valium [Diazepam] Other (See Comments)    UNKNOWN Makes pt. Feel "out of wack"    PAST MEDICAL HISTORY: Past Medical History  Diagnosis Date  . Anxiety   . Depression   . Hypertension   . Anemia     MEDICATIONS AT HOME: Prior to Admission medications   Medication Sig Start Date End Date Taking? Authorizing Provider  gabapentin (NEURONTIN) 600 MG tablet Take 1 tablet (600 mg total) by mouth 3 (three) times daily. 05/09/13  Yes Angelica Chessman, MD   ibuprofen (ADVIL,MOTRIN) 800 MG tablet Take 1 tablet (800 mg total) by mouth every 8 (eight) hours as needed. 02/12/13  Yes Angelica Chessman, MD  acetaminophen-codeine (TYLENOL #3) 300-30 MG per tablet Take 1 tablet by mouth every 4 (four) hours as needed for pain. 02/01/13   Angelica Chessman, MD  famotidine (PEPCID) 20 MG tablet Take 1 tablet (20 mg total) by mouth 2 (two) times daily. For Reflux 10/11/11   Alyson Kuroski-Mazzei, DO  FLUoxetine (PROZAC) 10 MG capsule Take 1 capsule (10 mg total) by mouth at bedtime. For depression 10/11/11 10/10/12  Alyson Kuroski-Mazzei, DO  hydrochlorothiazide (HYDRODIURIL) 12.5 MG tablet Take 1 tablet (12.5 mg total) by mouth daily. 05/09/13   Angelica Chessman, MD  metroNIDAZOLE (FLAGYL) 500 MG tablet Take 1 tablet (500 mg total) by mouth 2 (two) times daily. 12/26/12   Peggy Constant, MD  promethazine (PHENERGAN) 12.5 MG tablet Take 1 tablet (12.5 mg total) by mouth every 6 (six) hours as needed (Take 30 minutes prior to Tramadol to help prevent nausea.). 10/04/12   Venetia Maxon Rama, MD  traMADol (ULTRAM) 50 MG tablet Take 1 tablet (50 mg total) by mouth every 8 (eight) hours as needed for pain. 08/22/12   Thurman Coyer, DO  vitamin B-12 (CYANOCOBALAMIN) 250 MCG tablet Take 1 tablet (250 mcg total) by mouth daily. 05/09/13   Angelica Chessman, MD     Objective:   Filed Vitals:  05/09/13 1209 05/09/13 1211  BP:  127/88  Pulse:  70  Temp:  98.7 F (37.1 C)  TempSrc:  Oral  Resp:  16  Height: 5' 6.5" (1.689 m)   Weight: 163 lb (73.936 kg)   SpO2:  100%    Exam General appearance : Awake, alert, not in any distress. Speech Clear. Not toxic looking HEENT: Atraumatic and Normocephalic, pupils equally reactive to light and accomodation Neck: supple, no JVD. No cervical lymphadenopathy.  Chest:Good air entry bilaterally, no added sounds  CVS: S1 S2 regular, no murmurs.  Abdomen: Bowel sounds present, Non tender and not distended with no gaurding, rigidity  or rebound. Extremities: B/L Lower Ext shows no edema, both legs are warm to touch Neurology: Awake alert, and oriented X 3, CN II-XII intact, Non focal Skin:No Rash Wounds:N/A   Data Review   CBC No results found for this basename: WBC, HGB, HCT, PLT, MCV, MCH, MCHC, RDW, NEUTRABS, LYMPHSABS, MONOABS, EOSABS, BASOSABS, BANDABS, BANDSABD,  in the last 168 hours  Chemistries   No results found for this basename: NA, K, CL, CO2, GLUCOSE, BUN, CREATININE, GFRCGP, CALCIUM, MG, AST, ALT, ALKPHOS, BILITOT,  in the last 168 hours ------------------------------------------------------------------------------------------------------------------ No results found for this basename: HGBA1C,  in the last 72 hours ------------------------------------------------------------------------------------------------------------------ No results found for this basename: CHOL, HDL, LDLCALC, TRIG, CHOLHDL, LDLDIRECT,  in the last 72 hours ------------------------------------------------------------------------------------------------------------------ No results found for this basename: TSH, T4TOTAL, FREET3, T3FREE, THYROIDAB,  in the last 72 hours ------------------------------------------------------------------------------------------------------------------ No results found for this basename: VITAMINB12, FOLATE, FERRITIN, TIBC, IRON, RETICCTPCT,  in the last 72 hours  Coagulation profile  No results found for this basename: INR, PROTIME,  in the last 168 hours    Assessment & Plan   1. Essential hypertension, benign  - hydrochlorothiazide (HYDRODIURIL) 12.5 MG tablet; Take 1 tablet (12.5 mg total) by mouth daily.  Dispense: 90 tablet; Refill: 3  2. Peripheral neuropathy most likely from previous alcohol abuse  - gabapentin (NEURONTIN) 600 MG tablet; Take 1 tablet (600 mg total) by mouth 3 (three) times daily.  Dispense: 180 tablet; Refill: 3 - vitamin B-12 (CYANOCOBALAMIN) 250 MCG tablet; Take 1  tablet (250 mcg total) by mouth daily.  Dispense: 90 tablet; Refill: 3  Follow up in 3 months or when necessary  The patient was given clear instructions to go to ER or return to medical center if symptoms don't improve, worsen or new problems develop. The patient verbalized understanding. The patient was told to call to get lab results if they haven't heard anything in the next week.    Angelica Chessman, MD, [place], [place], [place] and [place], [place]   05/09/2013, 1:08 PM

## 2013-05-22 ENCOUNTER — Telehealth: Payer: Self-pay | Admitting: Internal Medicine

## 2013-05-22 ENCOUNTER — Telehealth: Payer: Self-pay | Admitting: Emergency Medicine

## 2013-05-22 MED ORDER — IBUPROFEN 800 MG PO TABS: 800.0000 mg | ORAL_TABLET | Freq: Three times a day (TID) | ORAL | Status: DC | PRN

## 2013-05-22 NOTE — Telephone Encounter (Signed)
Pt called in requesting pain medication for numbness in both feet/hands. Pt states Tramadol not effective and unsure of taking Tylenol #3 due to past alcohol abuse. Refilled Ibuprofen 800mg  and e-scribed to [facility] pharmacy

## 2013-05-22 NOTE — Telephone Encounter (Signed)
Pt called to speak to a nurse, and would like for her nurse to call her back. Please contact pt

## 2013-06-07 ENCOUNTER — Telehealth: Payer: Self-pay | Admitting: Internal Medicine

## 2013-06-07 NOTE — Telephone Encounter (Signed)
Pt called in today to see if she can get refills on vitamin B-12 (CYANOCOBALAMIN) 250 MCG tablet; please f/u with pt

## 2013-06-10 ENCOUNTER — Telehealth: Payer: Self-pay | Admitting: Emergency Medicine

## 2013-06-10 NOTE — Telephone Encounter (Signed)
Pt informed she has # 3 refill Vitamin B-12 @ [facility]. Verbalized understanding

## 2013-06-14 ENCOUNTER — Encounter: Payer: Self-pay | Admitting: Internal Medicine

## 2013-07-09 ENCOUNTER — Telehealth: Payer: Self-pay

## 2013-07-09 NOTE — Telephone Encounter (Signed)
Patient called stating something "strange" happened to her on this past sat(4 days ago) She woke up at about 4 am and was having some numbness to her right shoulder blade down her right Arm and all the way down her right leg. Stated it lasted about an hour and the next day she was better Did not follow up in the ED or urgent care i did explain to her that she should have followed up with the symptoms she was experiencing the next day, either in the ED or [facility]

## 2013-07-31 ENCOUNTER — Ambulatory Visit: Payer: No Typology Code available for payment source | Admitting: Internal Medicine

## 2013-08-07 ENCOUNTER — Ambulatory Visit: Payer: No Typology Code available for payment source | Admitting: Family Medicine

## 2013-08-07 ENCOUNTER — Telehealth: Payer: Self-pay | Admitting: Internal Medicine

## 2013-08-07 DIAGNOSIS — G629 Polyneuropathy, unspecified: Secondary | ICD-10-CM

## 2013-08-07 NOTE — Telephone Encounter (Signed)
Pt has called in today to speak with a nurse about her medications as well as an appointment; please f/u with pt on home phone because the cell phone does not accept VM;

## 2013-08-08 MED ORDER — GABAPENTIN 300 MG PO CAPS: ORAL_CAPSULE | ORAL | Status: DC

## 2013-08-08 NOTE — Telephone Encounter (Signed)
Patient called for refill on Gabapentin. Patient states it is to expensive to refill at [facility]. Informed patient that we will refill prescription only enough until her September 12, 2013 appointment. Patient verbalized understanding. Alverda Skeans, RN

## 2013-09-02 ENCOUNTER — Encounter: Payer: Self-pay | Admitting: Internal Medicine

## 2013-09-12 ENCOUNTER — Ambulatory Visit: Payer: No Typology Code available for payment source | Admitting: Internal Medicine

## 2013-10-03 ENCOUNTER — Ambulatory Visit: Payer: No Typology Code available for payment source | Admitting: Internal Medicine

## 2013-11-05 ENCOUNTER — Ambulatory Visit: Payer: No Typology Code available for payment source | Admitting: Internal Medicine

## 2013-11-21 ENCOUNTER — Ambulatory Visit: Payer: Self-pay | Admitting: Internal Medicine

## 2013-12-10 ENCOUNTER — Ambulatory Visit: Payer: Self-pay | Admitting: Internal Medicine

## 2014-01-02 ENCOUNTER — Ambulatory Visit: Payer: No Typology Code available for payment source | Attending: Internal Medicine | Admitting: Internal Medicine

## 2014-01-02 ENCOUNTER — Encounter: Payer: Self-pay | Admitting: Internal Medicine

## 2014-01-02 VITALS — BP 122/81 | HR 85 | Temp 98.7°F | Resp 16 | Wt 166.6 lb

## 2014-01-02 DIAGNOSIS — Z139 Encounter for screening, unspecified: Secondary | ICD-10-CM

## 2014-01-02 DIAGNOSIS — I10 Essential (primary) hypertension: Secondary | ICD-10-CM

## 2014-01-02 DIAGNOSIS — Z833 Family history of diabetes mellitus: Secondary | ICD-10-CM | POA: Insufficient documentation

## 2014-01-02 DIAGNOSIS — F329 Major depressive disorder, single episode, unspecified: Secondary | ICD-10-CM

## 2014-01-02 DIAGNOSIS — F17200 Nicotine dependence, unspecified, uncomplicated: Secondary | ICD-10-CM | POA: Insufficient documentation

## 2014-01-02 DIAGNOSIS — Z23 Encounter for immunization: Secondary | ICD-10-CM | POA: Insufficient documentation

## 2014-01-02 DIAGNOSIS — F3289 Other specified depressive episodes: Secondary | ICD-10-CM | POA: Insufficient documentation

## 2014-01-02 DIAGNOSIS — D649 Anemia, unspecified: Secondary | ICD-10-CM | POA: Insufficient documentation

## 2014-01-02 DIAGNOSIS — F32A Depression, unspecified: Secondary | ICD-10-CM

## 2014-01-02 LAB — COMPLETE METABOLIC PANEL WITH GFR
ALBUMIN: 4.5 g/dL (ref 3.5–5.2)
ALK PHOS: 78 U/L (ref 39–117)
ALT: 9 U/L (ref 0–35)
AST: 14 U/L (ref 0–37)
BUN: 10 mg/dL (ref 6–23)
CHLORIDE: 103 meq/L (ref 96–112)
CO2: 27 mEq/L (ref 19–32)
CREATININE: 0.99 mg/dL (ref 0.50–1.10)
Calcium: 10.1 mg/dL (ref 8.4–10.5)
GFR, Est African American: 78 mL/min
GFR, Est Non African American: 68 mL/min
GLUCOSE: 89 mg/dL (ref 70–99)
Potassium: 4.2 mEq/L (ref 3.5–5.3)
SODIUM: 139 meq/L (ref 135–145)
TOTAL PROTEIN: 7.3 g/dL (ref 6.0–8.3)
Total Bilirubin: 0.4 mg/dL (ref 0.2–1.2)

## 2014-01-02 MED ORDER — BUPROPION HCL ER (XL) 150 MG PO TB24: 150.0000 mg | ORAL_TABLET | Freq: Every day | ORAL | Status: DC

## 2014-01-02 NOTE — Progress Notes (Signed)
Patient complains of feeling depressed  Her anxiety has increased Feels warm all  The time

## 2014-01-02 NOTE — Progress Notes (Signed)
MRN: [id number] Name: Kendra Griffith  Sex: female Age: 48 y.o. DOB: 01/09/1966  Allergies: Darvocet; Hydrocodone-acetaminophen; Percocet; and Valium  Chief Complaint  Patient presents with  . Anxiety    HPI: Patient is 48 y.o. female who has to of alcohol abuse hypertension depression comes today for followup, as per patient she used to be on antidepressant medication which was Prozac her last time she used it was almost 2 years ago then she missed appointment with behavioral health, she reports worsening in the symptoms and wants to take some medication for that, she also smoked cigarettes, I have advised patient to quit smoking as per patient she tried nicotine patch in the past, discussed about trying Wellbutrin which might help with both depression and smoking cessation, she agrees currently denies any SI or HI, she does report family history of diabetes. Patient also takes vitamin gabapentin for neuropathy.  Past Medical History  Diagnosis Date  . Anxiety   . Depression   . Hypertension   . Anemia     Past Surgical History  Procedure Laterality Date  . Cesarean section    . Cyst removal 1997    . Facial reconstruction surgery    . Foot surgery Bilateral       Medication List       This list is accurate as of: 01/02/14  3:14 PM.  Always use your most recent med list.               acetaminophen-codeine 300-30 MG per tablet  Commonly known as:  TYLENOL #3  Take 1 tablet by mouth every 4 (four) hours as needed for pain.     buPROPion 150 MG 24 hr tablet  Commonly known as:  WELLBUTRIN XL  Take 1 tablet (150 mg total) by mouth daily.     famotidine 20 MG tablet  Commonly known as:  PEPCID  Take 1 tablet (20 mg total) by mouth 2 (two) times daily. For Reflux     gabapentin 300 MG capsule  Commonly known as:  NEURONTIN  Take 2 capsules  Three times daily     hydrochlorothiazide 12.5 MG tablet  Commonly known as:  HYDRODIURIL  Take 1 tablet (12.5 mg total)  by mouth daily.     ibuprofen 800 MG tablet  Commonly known as:  ADVIL,MOTRIN  Take 1 tablet (800 mg total) by mouth every 8 (eight) hours as needed.     metroNIDAZOLE 500 MG tablet  Commonly known as:  FLAGYL  Take 1 tablet (500 mg total) by mouth 2 (two) times daily.     promethazine 12.5 MG tablet  Commonly known as:  PHENERGAN  Take 1 tablet (12.5 mg total) by mouth every 6 (six) hours as needed (Take 30 minutes prior to Tramadol to help prevent nausea.).     traMADol 50 MG tablet  Commonly known as:  ULTRAM  Take 1 tablet (50 mg total) by mouth every 8 (eight) hours as needed for pain.     vitamin B-12 250 MCG tablet  Commonly known as:  CYANOCOBALAMIN  Take 1 tablet (250 mcg total) by mouth daily.        Meds ordered this encounter  Medications  . buPROPion (WELLBUTRIN XL) 150 MG 24 hr tablet    Sig: Take 1 tablet (150 mg total) by mouth daily.    Dispense:  30 tablet    Refill:  3    Immunization History  Administered Date(s) Administered  . Influenza,inj,quad,  With Preservative 02/01/2013    Family History  Problem Relation Age of Onset  . Heart disease Mother   . Asthma Sister   . COPD Sister     History  Substance Use Topics  . Smoking status: Current Every Day Smoker -- 2.00 packs/day for 29 years    Types: Cigarettes  . Smokeless tobacco: Never Used     Comment: smokes 1-2 ppd  . Alcohol Use: No     Comment: Quit drinking in 2012.    Review of Systems   As noted in HPI  Filed Vitals:   01/02/14 1428  BP: 122/81  Pulse: 85  Temp: 98.7 F (37.1 C)  Resp: 16    Physical Exam  Physical Exam  Constitutional: No distress.  Eyes: EOM are normal. Pupils are equal, round, and reactive to light.  Cardiovascular: Normal rate and regular rhythm.   Pulmonary/Chest: Breath sounds normal. No respiratory distress. She has no wheezes. She has no rales.  Musculoskeletal: She exhibits no edema.    CBC    Component Value Date/Time   WBC 7.0  02/01/2013 1220   RBC 4.24 02/01/2013 1220   HGB 11.3* 02/01/2013 1220   HCT 34.1* 02/01/2013 1220   PLT 210 02/01/2013 1220   MCV 80.4 02/01/2013 1220   LYMPHSABS 3.2 02/01/2013 1220   MONOABS 0.6 02/01/2013 1220   EOSABS 0.2 02/01/2013 1220   BASOSABS 0.1 02/01/2013 1220    CMP     Component Value Date/Time   NA 141 02/01/2013 1220   K 4.7 02/01/2013 1220   CL 103 02/01/2013 1220   CO2 30 02/01/2013 1220   GLUCOSE 100* 02/01/2013 1220   BUN 10 02/01/2013 1220   CREATININE 0.86 02/01/2013 1220   CREATININE 0.69 10/23/2011 0405   CALCIUM 9.8 02/01/2013 1220   PROT 6.8 02/01/2013 1220   ALBUMIN 4.4 02/01/2013 1220   AST 13 02/01/2013 1220   ALT 9 02/01/2013 1220   ALKPHOS 93 02/01/2013 1220   BILITOT 0.3 02/01/2013 1220   GFRNONAA >90 10/23/2011 0405   GFRAA >90 10/23/2011 0405    Lab Results  Component Value Date/Time   CHOL 153 10/04/2012 12:02 PM    No components found with this basename: hga1c    Lab Results  Component Value Date/Time   AST 13 02/01/2013 12:20 PM    Assessment and Plan  Essential hypertension, benign - Plan: Currently patient is on hydrochlorothiazide blood pressure is controlled, repeat blood chemistry COMPLETE METABOLIC PANEL WITH GFR  Smoking - Plan: buPROPion (WELLBUTRIN XL) 150 MG 24 hr tablet  Family history of diabetes mellitus (DM) - Plan: Will check Hemoglobin A1c  Anemia, unspecified - Plan: Anemia panel  Screening - Plan: Vit D  25 hydroxy (rtn osteoporosis monitoring), TSH  Depression - Plan: buPROPion (WELLBUTRIN XL) 150 MG 24 hr tablet   Health Maintenance   -Mammogram: patient will schedule   Flue shot today   Return in about 3 months (around 04/03/2014) for hypertension, depression.  Lorayne Marek, MD

## 2014-01-03 LAB — ANEMIA PANEL
%SAT: 27 % (ref 20–55)
ABS RETIC: 62.2 10*3/uL (ref 19.0–186.0)
FERRITIN: 10 ng/mL (ref 10–291)
Folate: >20 ng/mL
Iron: 109 ug/dL (ref 42–145)
RBC.: 4.44 MIL/uL (ref 3.87–5.11)
Retic Ct Pct: 1.4 % (ref 0.4–2.3)
TIBC: 403 ug/dL (ref 250–470)
UIBC: 294 ug/dL (ref 125–400)
VITAMIN B 12: 1029 pg/mL — AB (ref 211–911)

## 2014-01-03 LAB — HEMOGLOBIN A1C
Hgb A1c MFr Bld: 6.6 % — ABNORMAL HIGH (ref <5.7)
Mean Plasma Glucose: 143 mg/dL — ABNORMAL HIGH (ref <117)

## 2014-01-03 LAB — VITAMIN D 25 HYDROXY (VIT D DEFICIENCY, FRACTURES): Vit D, 25-Hydroxy: 45 ng/mL (ref 30–89)

## 2014-01-03 LAB — TSH: TSH: 1.962 u[IU]/mL (ref 0.350–4.500)

## 2014-01-06 ENCOUNTER — Telehealth: Payer: Self-pay | Admitting: Emergency Medicine

## 2014-01-06 MED ORDER — METFORMIN HCL 500 MG PO TABS: 500.0000 mg | ORAL_TABLET | Freq: Every day | ORAL | Status: DC

## 2014-01-06 NOTE — Telephone Encounter (Signed)
Message copied by Ricci Barker on Mon Jan 06, 2014 12:28 PM ------      Message from: Lorayne Marek      Created: Fri Jan 03, 2014 10:16 AM       Call and let the patient know that her vitamin B12 level is high, she can stop taking her vitamin B12 supplements. Since her ferritin level is borderline low Patient can continue with iron supplements.       Hemoglobin A1c is 6.6%, she has diabetes, advise patient for low carbohydrate diet and she can start taking metformin 500 mg daily. Will repeat her A1c in 3 months. ------

## 2014-01-06 NOTE — Telephone Encounter (Signed)
Left message for pt to call for lab results Metformin 500 mg daily ordered and e-scribed to [place]

## 2014-01-07 ENCOUNTER — Ambulatory Visit: Payer: Self-pay | Attending: Internal Medicine | Admitting: *Deleted

## 2014-01-07 DIAGNOSIS — Z23 Encounter for immunization: Secondary | ICD-10-CM

## 2014-01-07 DIAGNOSIS — E119 Type 2 diabetes mellitus without complications: Secondary | ICD-10-CM | POA: Insufficient documentation

## 2014-01-07 DIAGNOSIS — F17200 Nicotine dependence, unspecified, uncomplicated: Secondary | ICD-10-CM | POA: Insufficient documentation

## 2014-01-07 NOTE — Progress Notes (Signed)
Patient ID: Kendra Griffith, female   DOB: 1965/12/04, 48 y.o.   MRN: [id number]   Patient presents for flu vaccine  Reviewed lab results and instructions with patient. Patient is aware that she has diabetes. Discussed diet and exercise changes at length Provided information on Diabetes and Food and Diabetes and Exercise Provided information on smoking cessation. Patient states she currently smokes 2 ppd but is trying to cut back. States she has wellbutrin but has not started Patient aware that metformin is ready in [place]

## 2014-01-07 NOTE — Patient Instructions (Signed)
Smoking Cessation Quitting smoking is important to your health and has many advantages. However, it is not always easy to quit since nicotine is a very addictive drug. Oftentimes, people try 3 times or more before being able to quit. This document explains the best ways for you to prepare to quit smoking. Quitting takes hard work and a lot of effort, but you can do it. ADVANTAGES OF QUITTING SMOKING  You will live longer, feel better, and live better.  Your body will feel the impact of quitting smoking almost immediately.  Within 20 minutes, blood pressure decreases. Your pulse returns to its normal level.  After 8 hours, carbon monoxide levels in the blood return to normal. Your oxygen level increases.  After 24 hours, the chance of having a heart attack starts to decrease. Your breath, hair, and body stop smelling like smoke.  After 48 hours, damaged nerve endings begin to recover. Your sense of taste and smell improve.  After 72 hours, the body is virtually free of nicotine. Your bronchial tubes relax and breathing becomes easier.  After 2 to 12 weeks, lungs can hold more air. Exercise becomes easier and circulation improves.  The risk of having a heart attack, stroke, cancer, or lung disease is greatly reduced.  After 1 year, the risk of coronary heart disease is cut in half.  After 5 years, the risk of stroke falls to the same as a nonsmoker.  After 10 years, the risk of lung cancer is cut in half and the risk of other cancers decreases significantly.  After 15 years, the risk of coronary heart disease drops, usually to the level of a nonsmoker.  If you are pregnant, quitting smoking will improve your chances of having a healthy baby.  The people you live with, especially any children, will be healthier.  You will have extra money to spend on things other than cigarettes. QUESTIONS TO THINK ABOUT BEFORE ATTEMPTING TO QUIT You may want to talk about your answers with your  health care provider.  Why do you want to quit?  If you tried to quit in the past, what helped and what did not?  What will be the most difficult situations for you after you quit? How will you plan to handle them?  Who can help you through the tough times? Your family? Friends? A health care provider?  What pleasures do you get from smoking? What ways can you still get pleasure if you quit? Here are some questions to ask your health care provider:  How can you help me to be successful at quitting?  What medicine do you think would be best for me and how should I take it?  What should I do if I need more help?  What is smoking withdrawal like? How can I get information on withdrawal? GET READY  Set a quit date.  Change your environment by getting rid of all cigarettes, ashtrays, matches, and lighters in your home, car, or work. Do not let people smoke in your home.  Review your past attempts to quit. Think about what worked and what did not. GET SUPPORT AND ENCOURAGEMENT You have a better chance of being successful if you have help. You can get support in many ways.  Tell your family, friends, and coworkers that you are going to quit and need their support. Ask them not to smoke around you.  Get individual, group, or telephone counseling and support. Programs are available at local hospitals and health centers. Call   your local health department for information about programs in your area.  Spiritual beliefs and practices may help some smokers quit.  Download a "quit meter" on your computer to keep track of quit statistics, such as how long you have gone without smoking, cigarettes not smoked, and money saved.  Get a self-help book about quitting smoking and staying off tobacco. [place] yourself from urges to smoke. Talk to someone, go for a walk, or occupy your time with a task.  Change your normal routine. Take a different route to work.  Drink tea instead of coffee. Eat breakfast in a different place.  Reduce your stress. Take a hot bath, exercise, or read a book.  Plan something enjoyable to do every day. Reward yourself for not smoking.  Explore interactive web-based programs that specialize in helping you quit. GET MEDICINE AND USE IT CORRECTLY Medicines can help you stop smoking and decrease the urge to smoke. Combining medicine with the above behavioral methods and support can greatly increase your chances of successfully quitting smoking.  Nicotine replacement therapy helps deliver nicotine to your body without the negative effects and risks of smoking. Nicotine replacement therapy includes nicotine gum, lozenges, inhalers, nasal sprays, and skin patches. Some may be available over-the-counter and others require a prescription.  Antidepressant medicine helps people abstain from smoking, but how this works is unknown. This medicine is available by prescription.  Nicotinic receptor partial agonist medicine simulates the effect of nicotine in your brain. This medicine is available by prescription. Ask your health care provider for advice about which medicines to use and how to use them based on your health history. Your health care provider will tell you what side effects to look out for if you choose to be on a medicine or therapy. Carefully read the information on the package. Do not use any other product containing nicotine while using a nicotine replacement product.  RELAPSE OR DIFFICULT SITUATIONS Most relapses occur within the first 3 months after quitting. Do not be discouraged if you start smoking again. Remember, most people try several times before finally quitting. You may have symptoms of withdrawal because your body is used to nicotine. You may crave cigarettes, be irritable, feel very hungry, cough often, get headaches, or have difficulty concentrating. The withdrawal symptoms are only temporary. They are strongest  when you first quit, but they will go away within 10-14 days. To reduce the chances of relapse, try to:  Avoid drinking alcohol. Drinking lowers your chances of successfully quitting.  Reduce the amount of caffeine you consume. Once you quit smoking, the amount of caffeine in your body increases and can give you symptoms, such as a rapid heartbeat, sweating, and anxiety.  Avoid smokers because they can make you want to smoke.  Do not let weight gain distract you. Many smokers will gain weight when they quit, usually less than 10 pounds. Eat a healthy diet and stay active. You can always lose the weight gained after you quit.  Find ways to improve your mood other than smoking. FOR MORE INFORMATION  www.smokefree.gov  Document Released: 03/22/2001 Document Revised: 08/12/2013 Document Reviewed: 07/07/2011 [facility] Patient Information 2015 [place], [place]. This information is not intended to replace advice given to you by your health care provider. Make sure you discuss any questions you have with your health care provider. Diabetes and Exercise Exercising regularly is important. It is not just about losing weight. It has many health benefits, such as:  Improving your overall fitness, flexibility, and endurance.  Increasing your bone density.  Helping with weight control.  Decreasing your body fat.  Increasing your muscle strength.  Reducing stress and tension.  Improving your overall health. People with diabetes who exercise gain additional benefits because exercise:  Reduces appetite.  Improves the body's use of blood sugar (glucose).  Helps lower or control blood glucose.  Decreases blood pressure.  Helps control blood lipids (such as cholesterol and triglycerides).  Improves the body's use of the hormone insulin by:  Increasing the body's insulin sensitivity.  Reducing the body's insulin needs.  Decreases the risk for heart disease because exercising:  Lowers  cholesterol and triglycerides levels.  Increases the levels of good cholesterol (such as high-density lipoproteins [HDL]) in the body.  Lowers blood glucose levels. YOUR ACTIVITY PLAN  Choose an activity that you enjoy and set realistic goals. Your health care provider or diabetes educator can help you make an activity plan that works for you. Exercise regularly as directed by your health care provider. This includes:  Performing resistance training twice a week such as push-ups, sit-ups, lifting weights, or using resistance bands.  Performing 150 minutes of cardio exercises each week such as walking, running, or playing sports.  Staying active and spending no more than 90 minutes at one time being inactive. Even short bursts of exercise are good for you. Three 10-minute sessions spread throughout the day are just as beneficial as a single 30-minute session. Some exercise ideas include:  Taking the dog for a walk.  Taking the stairs instead of the elevator.  Dancing to your favorite song.  Doing an exercise video.  Doing your favorite exercise with a friend. RECOMMENDATIONS FOR EXERCISING WITH TYPE 1 OR TYPE 2 DIABETES   Check your blood glucose before exercising. If blood glucose levels are greater than 240 mg/dL, check for urine ketones. Do not exercise if ketones are present.  Avoid injecting insulin into areas of the body that are going to be exercised. For example, avoid injecting insulin into:  The arms when playing tennis.  The legs when jogging.  Keep a record of:  Food intake before and after you exercise.  Expected peak times of insulin action.  Blood glucose levels before and after you exercise.  The type and amount of exercise you have done.  Review your records with your health care provider. Your health care provider will help you to develop guidelines for adjusting food intake and insulin amounts before and after exercising.  If you take insulin or oral  hypoglycemic agents, watch for signs and symptoms of hypoglycemia. They include:  Dizziness.  Shaking.  Sweating.  Chills.  Confusion.  Drink plenty of water while you exercise to prevent dehydration or heat stroke. Body water is lost during exercise and must be replaced.  Talk to your health care provider before starting an exercise program to make sure it is safe for you. Remember, almost any type of activity is better than none. Document Released: 06/18/2003 Document Revised: 08/12/2013 Document Reviewed: 09/04/2012 [facility] Patient Information 2015 [place], [place]. This information is not intended to replace advice given to you by your health care provider. Make sure you discuss any questions you have with your health care provider. Diabetes Mellitus and Food It is important for you to manage your blood sugar (glucose) level. Your blood glucose level can be greatly affected by what you eat. Eating healthier foods in the appropriate amounts throughout the day at about the same  time each day will help you control your blood glucose level. It can also help slow or prevent worsening of your diabetes mellitus. Healthy eating may even help you improve the level of your blood pressure and reach or maintain a healthy weight.  HOW CAN FOOD AFFECT ME? Carbohydrates Carbohydrates affect your blood glucose level more than any other type of food. Your dietitian will help you determine how many carbohydrates to eat at each meal and teach you how to count carbohydrates. Counting carbohydrates is important to keep your blood glucose at a healthy level, especially if you are using insulin or taking certain medicines for diabetes mellitus. Alcohol Alcohol can cause sudden decreases in blood glucose (hypoglycemia), especially if you use insulin or take certain medicines for diabetes mellitus. Hypoglycemia can be a life-threatening condition. Symptoms of hypoglycemia (sleepiness, dizziness, and  disorientation) are similar to symptoms of having too much alcohol.  If your health care provider has given you approval to drink alcohol, do so in moderation and use the following guidelines:  Women should not have more than one drink per day, and men should not have more than two drinks per day. One drink is equal to:  12 oz of beer.  5 oz of wine.  1 oz of hard liquor.  Do not drink on an empty stomach.  Keep yourself hydrated. Have water, diet soda, or unsweetened iced tea.  Regular soda, juice, and other mixers might contain a lot of carbohydrates and should be counted. WHAT FOODS ARE NOT RECOMMENDED? As you make food choices, it is important to remember that all foods are not the same. Some foods have fewer nutrients per serving than other foods, even though they might have the same number of calories or carbohydrates. It is difficult to get your body what it needs when you eat foods with fewer nutrients. Examples of foods that you should avoid that are high in calories and carbohydrates but low in nutrients include:  Trans fats (most processed foods list trans fats on the Nutrition Facts label).  Regular soda.  Juice.  Candy.  Sweets, such as cake, pie, doughnuts, and cookies.  Fried foods. WHAT FOODS CAN I EAT? Have nutrient-rich foods, which will nourish your body and keep you healthy. The food you should eat also will depend on several factors, including:  The calories you need.  The medicines you take.  Your weight.  Your blood glucose level.  Your blood pressure level.  Your cholesterol level. You also should eat a variety of foods, including:  Protein, such as meat, poultry, fish, tofu, nuts, and seeds (lean animal proteins are best).  Fruits.  Vegetables.  Dairy products, such as milk, cheese, and yogurt (low fat is best).  Breads, grains, pasta, cereal, rice, and beans.  Fats such as olive oil, trans fat-free margarine, canola oil, avocado, and  olives. DOES EVERYONE WITH DIABETES MELLITUS HAVE THE SAME MEAL PLAN? Because every person with diabetes mellitus is different, there is not one meal plan that works for everyone. It is very important that you meet with a dietitian who will help you create a meal plan that is just right for you. Document Released: 12/23/2004 Document Revised: 04/02/2013 Document Reviewed: 02/22/2013 [facility] Patient Information 2015 [place], [place]. This information is not intended to replace advice given to you by your health care provider. Make sure you discuss any questions you have with your health care provider.

## 2014-01-08 ENCOUNTER — Ambulatory Visit: Payer: Self-pay

## 2014-01-08 ENCOUNTER — Other Ambulatory Visit: Payer: Self-pay

## 2014-01-08 MED ORDER — FAMOTIDINE 20 MG PO TABS: 20.0000 mg | ORAL_TABLET | Freq: Two times a day (BID) | ORAL | Status: DC

## 2014-01-28 ENCOUNTER — Other Ambulatory Visit: Payer: Self-pay | Admitting: Obstetrics and Gynecology

## 2014-01-28 DIAGNOSIS — Z1231 Encounter for screening mammogram for malignant neoplasm of breast: Secondary | ICD-10-CM

## 2014-01-29 ENCOUNTER — Ambulatory Visit: Payer: Self-pay

## 2014-02-04 ENCOUNTER — Other Ambulatory Visit: Payer: Self-pay | Admitting: Internal Medicine

## 2014-02-05 ENCOUNTER — Other Ambulatory Visit: Payer: Self-pay | Admitting: *Deleted

## 2014-02-05 ENCOUNTER — Telehealth: Payer: Self-pay | Admitting: *Deleted

## 2014-02-05 DIAGNOSIS — I10 Essential (primary) hypertension: Secondary | ICD-10-CM

## 2014-02-05 MED ORDER — HYDROCHLOROTHIAZIDE 12.5 MG PO TABS: 12.5000 mg | ORAL_TABLET | Freq: Every day | ORAL | Status: DC

## 2014-02-05 NOTE — Telephone Encounter (Signed)
Patient requesting refill of hydrochlorothiazide 12.5 mg.  Please call patient once script has been sent to pharmacy.  Christene Lye MSW, LCSW

## 2014-02-06 ENCOUNTER — Other Ambulatory Visit: Payer: Self-pay | Admitting: Emergency Medicine

## 2014-02-06 DIAGNOSIS — I10 Essential (primary) hypertension: Secondary | ICD-10-CM

## 2014-02-06 MED ORDER — HYDROCHLOROTHIAZIDE 12.5 MG PO TABS: 12.5000 mg | ORAL_TABLET | Freq: Every day | ORAL | Status: DC

## 2014-02-10 ENCOUNTER — Encounter: Payer: Self-pay | Admitting: Internal Medicine

## 2014-02-17 ENCOUNTER — Ambulatory Visit: Payer: Self-pay

## 2014-03-05 ENCOUNTER — Ambulatory Visit (HOSPITAL_COMMUNITY)
Admission: RE | Admit: 2014-03-05 | Discharge: 2014-03-05 | Disposition: A | Discharge status: Home / Self Care | Payer: Self-pay | Source: Ambulatory Visit | Attending: Obstetrics and Gynecology | Admitting: Obstetrics and Gynecology

## 2014-03-05 ENCOUNTER — Encounter (HOSPITAL_COMMUNITY): Payer: Self-pay

## 2014-03-05 VITALS — BP 112/74 | Ht 65.0 in | Wt 171.2 lb

## 2014-03-05 DIAGNOSIS — Z1231 Encounter for screening mammogram for malignant neoplasm of breast: Secondary | ICD-10-CM

## 2014-03-05 DIAGNOSIS — Z1239 Encounter for other screening for malignant neoplasm of breast: Secondary | ICD-10-CM

## 2014-03-05 HISTORY — DX: Prediabetes: R73.03

## 2014-03-05 NOTE — Patient Instructions (Signed)
Explained to Kendra Griffith that she did not need a Pap smear today due to last Pap smear was 12/18/2012. Let her know [facility] will cover Pap smears every 3 years unless has a history of abnormal Pap smears. Let [facility] know [facility] her within the next couple weeks with results by letter or phone. Smoking cessation discussed with patient and resources given. Kendra Griffith verbalized understanding. Patient escorted to mammography for a screening mammogram.  Brannock, Arvil Chaco, RN 10:58 AM

## 2014-03-05 NOTE — Progress Notes (Signed)
No complaints today.  Pap Smear: Pap smear not completed today. Last Pap smear was 12/18/2012 at [facility] and normal. Per patient has no history of an abnormal Pap smear. Last Pap smear result is in EPIC.  Physical exam: Breasts Breasts symmetrical. No skin abnormalities bilateral breasts. No nipple retraction bilateral breasts. No nipple discharge bilateral breasts. No lymphadenopathy. No lumps palpated bilateral breasts. No complaints of pain or tenderness on exam. Patient escorted to mammography for a screening mammogram.   Pelvic/Bimanual No Pap smear completed today since last Pap smear was 12/18/2012. Pap smear not indicated per BCCCP guidelines.   Smoking cessation discussed with patient. Referred patient to the [facility] and resources given to free smoking cessation classes offered at the [facility].

## 2014-03-12 ENCOUNTER — Telehealth: Payer: Self-pay | Admitting: Internal Medicine

## 2014-03-12 ENCOUNTER — Ambulatory Visit: Payer: Self-pay

## 2014-03-12 NOTE — Telephone Encounter (Signed)
Patient calling to request medication refill for HTN medication. Patient also wanting to speak to nurse in regards to complications that she is having with a different medication. Please assist.

## 2014-05-12 ENCOUNTER — Ambulatory Visit: Payer: Self-pay

## 2014-06-09 ENCOUNTER — Ambulatory Visit: Payer: Self-pay | Attending: Internal Medicine

## 2014-06-30 ENCOUNTER — Encounter: Payer: Self-pay | Admitting: Internal Medicine

## 2014-06-30 ENCOUNTER — Other Ambulatory Visit: Payer: Self-pay | Admitting: Internal Medicine

## 2014-06-30 ENCOUNTER — Ambulatory Visit: Payer: Self-pay | Attending: Internal Medicine | Admitting: Internal Medicine

## 2014-06-30 VITALS — BP 108/74 | HR 98 | Temp 99.8°F | Resp 16 | Ht 64.0 in | Wt 168.0 lb

## 2014-06-30 DIAGNOSIS — E119 Type 2 diabetes mellitus without complications: Secondary | ICD-10-CM

## 2014-06-30 DIAGNOSIS — E1142 Type 2 diabetes mellitus with diabetic polyneuropathy: Secondary | ICD-10-CM | POA: Insufficient documentation

## 2014-06-30 DIAGNOSIS — F17200 Nicotine dependence, unspecified, uncomplicated: Secondary | ICD-10-CM

## 2014-06-30 DIAGNOSIS — G629 Polyneuropathy, unspecified: Secondary | ICD-10-CM

## 2014-06-30 DIAGNOSIS — F329 Major depressive disorder, single episode, unspecified: Secondary | ICD-10-CM

## 2014-06-30 DIAGNOSIS — Z720 Tobacco use: Secondary | ICD-10-CM

## 2014-06-30 DIAGNOSIS — I10 Essential (primary) hypertension: Secondary | ICD-10-CM

## 2014-06-30 DIAGNOSIS — F32A Depression, unspecified: Secondary | ICD-10-CM

## 2014-06-30 LAB — POCT GLYCOSYLATED HEMOGLOBIN (HGB A1C): Hemoglobin A1C: 6.1

## 2014-06-30 LAB — GLUCOSE, POCT (MANUAL RESULT ENTRY): POC Glucose: 102 mg/dL — AB (ref 70–99)

## 2014-06-30 MED ORDER — METFORMIN HCL 500 MG PO TABS: 500.0000 mg | ORAL_TABLET | Freq: Every day | ORAL | Status: DC

## 2014-06-30 MED ORDER — BUPROPION HCL ER (XL) 150 MG PO TB24: 150.0000 mg | ORAL_TABLET | Freq: Every day | ORAL | Status: DC

## 2014-06-30 MED ORDER — HYDROCHLOROTHIAZIDE 12.5 MG PO TABS: 12.5000 mg | ORAL_TABLET | Freq: Every day | ORAL | Status: DC

## 2014-06-30 MED ORDER — CYANOCOBALAMIN 250 MCG PO TABS: 250.0000 ug | ORAL_TABLET | Freq: Every day | ORAL | Status: DC

## 2014-06-30 MED ORDER — GABAPENTIN 300 MG PO CAPS: ORAL_CAPSULE | ORAL | Status: DC

## 2014-06-30 NOTE — Progress Notes (Signed)
Pt here for follow up wellness visit for HTN, Diabetes and back pain Pt is c/o intermit numbness sensation all over States she is taking prescribed medications C/o increased anxiety due to at home stressors  Pt currently smoking 2 packs cigarettes per day; smoking cessation offered  Need Flu/PNA vaccine A1c/CBG obtained  PHQ-9/ GAD-7 screening done

## 2014-06-30 NOTE — Patient Instructions (Signed)
Depression Depression refers to feeling sad, low, down in the dumps, blue, gloomy, or empty. In general, there are two kinds of depression: 1. Normal sadness or normal grief. This kind of depression is one that we all feel from time to time after upsetting life experiences, such as the loss of a job or the ending of a relationship. This kind of depression is considered normal, is short lived, and resolves within a few days to 2 weeks. Depression experienced after the loss of a loved one (bereavement) often lasts longer than 2 weeks but normally gets better with time. 2. Clinical depression. This kind of depression lasts longer than normal sadness or normal grief or interferes with your ability to function at home, at work, and in school. It also interferes with your personal relationships. It affects almost every aspect of your life. Clinical depression is an illness. Symptoms of depression can also be caused by conditions other than those mentioned above, such as:  Physical illness. Some physical illnesses, including underactive thyroid gland (hypothyroidism), severe anemia, specific types of cancer, diabetes, uncontrolled seizures, heart and lung problems, strokes, and chronic pain are commonly associated with symptoms of depression.  Side effects of some prescription medicine. In some people, certain types of medicine can cause symptoms of depression.  Substance abuse. Abuse of alcohol and illicit drugs can cause symptoms of depression. SYMPTOMS Symptoms of normal sadness and normal grief include the following:  Feeling sad or crying for short periods of time.  Not caring about anything (apathy).  Difficulty sleeping or sleeping too much.  No longer able to enjoy the things you used to enjoy.  Desire to be by oneself all the time (social isolation).  Lack of energy or motivation.  Difficulty concentrating or remembering.  Change in appetite or weight.  Restlessness or  agitation. Symptoms of clinical depression include the same symptoms of normal sadness or normal grief and also the following symptoms:  Feeling sad or crying all the time.  Feelings of guilt or worthlessness.  Feelings of hopelessness or helplessness.  Thoughts of suicide or the desire to harm yourself (suicidal ideation).  Loss of touch with reality (psychotic symptoms). Seeing or hearing things that are not real (hallucinations) or having false beliefs about your life or the people around you (delusions and paranoia). DIAGNOSIS  The diagnosis of clinical depression is usually based on how bad the symptoms are and how long they have lasted. Your health care provider will also ask you questions about your medical history and substance use to find out if physical illness, use of prescription medicine, or substance abuse is causing your depression. Your health care provider may also order blood tests. TREATMENT  Often, normal sadness and normal grief do not require treatment. However, sometimes antidepressant medicine is given for bereavement to ease the depressive symptoms until they resolve. The treatment for clinical depression depends on how bad the symptoms are but often includes antidepressant medicine, counseling with a mental health professional, or both. Your health care provider will help to determine what treatment is best for you. Depression caused by physical illness usually goes away with appropriate medical treatment of the illness. If prescription medicine is causing depression, talk with your health care provider about stopping the medicine, decreasing the dose, or changing to another medicine. Depression caused by the abuse of alcohol or illicit drugs goes away when you stop using these substances. Some adults need professional help in order to stop drinking or using drugs. SEEK IMMEDIATE MEDICAL   CARE IF:  You have thoughts about hurting yourself or others.  You lose touch  with reality (have psychotic symptoms).  You are taking medicine for depression and have a serious side effect. FOR MORE INFORMATION  National Alliance on Mental Illness: www.nami.[employer] of [employer]: https://carter.com/ Document Released: 03/25/2000 Document Revised: 08/12/2013 Document Reviewed: 06/27/2011 [facility] Patient Information 2015 [place], [place]. This information is not intended to replace advice given to you by your health care provider. Make sure you discuss any questions you have with your health care provider. Smoking Cessation Quitting smoking is important to your health and has many advantages. However, it is not always easy to quit since nicotine is a very addictive drug. Oftentimes, people try 3 times or more before being able to quit. This document explains the best ways for you to prepare to quit smoking. Quitting takes hard work and a lot of effort, but you can do it. ADVANTAGES OF QUITTING SMOKING 3. You will live longer, feel better, and live better. 4. Your body will feel the impact of quitting smoking almost immediately. 1. Within 20 minutes, blood pressure decreases. Your pulse returns to its normal level. 2. After 8 hours, carbon monoxide levels in the blood return to normal. Your oxygen level increases. 3. After 24 hours, the chance of having a heart attack starts to decrease. Your breath, hair, and body stop smelling like smoke. 4. After 48 hours, damaged nerve endings begin to recover. Your sense of taste and smell improve. 5. After 72 hours, the body is virtually free of nicotine. Your bronchial tubes relax and breathing becomes easier. 6. After 2 to 12 weeks, lungs can hold more air. Exercise becomes easier and circulation improves. 5. The risk of having a heart attack, stroke, cancer, or lung disease is greatly reduced. 1. After 1 year, the risk of coronary heart disease is cut in half. 2. After 5 years, the risk of stroke falls to the  same as a nonsmoker. 3. After 10 years, the risk of lung cancer is cut in half and the risk of other cancers decreases significantly. 4. After 15 years, the risk of coronary heart disease drops, usually to the level of a nonsmoker. 6. If you are pregnant, quitting smoking will improve your chances of having a healthy baby. 7. The people you live with, especially any children, will be healthier. 8. You will have extra money to spend on things other than cigarettes. QUESTIONS TO THINK ABOUT BEFORE ATTEMPTING TO QUIT You may want to talk about your answers with your health care provider.  Why do you want to quit?  If you tried to quit in the past, what helped and what did not?  What will be the most difficult situations for you after you quit? How will you plan to handle them?  Who can help you through the tough times? Your family? Friends? A health care provider?  What pleasures do you get from smoking? What ways can you still get pleasure if you quit? Here are some questions to ask your health care provider:  How can you help me to be successful at quitting?  What medicine do you think would be best for me and how should I take it?  What should I do if I need more help?  What is smoking withdrawal like? How can I get information on withdrawal? GET READY  Set a quit date.  Change your environment by getting rid of all cigarettes, ashtrays, matches, and lighters  in your home, car, or work. Do not let people smoke in your home.  Review your past attempts to quit. Think about what worked and what did not. GET SUPPORT AND ENCOURAGEMENT You have a better chance of being successful if you have help. You can get support in many ways.  Tell your family, friends, and coworkers that you are going to quit and need their support. Ask them not to smoke around you.  Get individual, group, or telephone counseling and support. Programs are available at [employer] and health centers. Call  your local health department for information about programs in your area.  Spiritual beliefs and practices may help some smokers quit.  Download a "quit meter" on your computer to keep track of quit statistics, such as how long you have gone without smoking, cigarettes not smoked, and money saved.  Get a self-help book about quitting smoking and staying off tobacco. [place] yourself from urges to smoke. Talk to someone, go for a walk, or occupy your time with a task.  Change your normal routine. Take a different route to work. Drink tea instead of coffee. Eat breakfast in a different place.  Reduce your stress. Take a hot bath, exercise, or read a book.  Plan something enjoyable to do every day. Reward yourself for not smoking.  Explore interactive web-based programs that specialize in helping you quit. GET MEDICINE AND USE IT CORRECTLY Medicines can help you stop smoking and decrease the urge to smoke. Combining medicine with the above behavioral methods and support can greatly increase your chances of successfully quitting smoking.  Nicotine replacement therapy helps deliver nicotine to your body without the negative effects and risks of smoking. Nicotine replacement therapy includes nicotine gum, lozenges, inhalers, nasal sprays, and skin patches. Some may be available over-the-counter and others require a prescription.  Antidepressant medicine helps people abstain from smoking, but how this works is unknown. This medicine is available by prescription.  Nicotinic receptor partial agonist medicine simulates the effect of nicotine in your brain. This medicine is available by prescription. Ask your health care provider for advice about which medicines to use and how to use them based on your health history. Your health care provider will tell you what side effects to look out for if you choose to be on a medicine or therapy. Carefully read the information on  the package. Do not use any other product containing nicotine while using a nicotine replacement product.  RELAPSE OR DIFFICULT SITUATIONS Most relapses occur within the first 3 months after quitting. Do not be discouraged if you start smoking again. Remember, most people try several times before finally quitting. You may have symptoms of withdrawal because your body is used to nicotine. You may crave cigarettes, be irritable, feel very hungry, cough often, get headaches, or have difficulty concentrating. The withdrawal symptoms are only temporary. They are strongest when you first quit, but they will go away within 10-14 days. To reduce the chances of relapse, try to:  Avoid drinking alcohol. Drinking lowers your chances of successfully quitting.  Reduce the amount of caffeine you consume. Once you quit smoking, the amount of caffeine in your body increases and can give you symptoms, such as a rapid heartbeat, sweating, and anxiety.  Avoid smokers because they can make you want to smoke.  Do not let weight gain distract you. Many smokers will gain weight when they quit, usually less than 10 pounds. Eat a healthy diet  and stay active. You can always lose the weight gained after you quit.  Find ways to improve your mood other than smoking. FOR MORE INFORMATION  www.smokefree.gov  Document Released: 03/22/2001 Document Revised: 08/12/2013 Document Reviewed: 07/07/2011 [facility] Patient Information 2015 [place], [place]. This information is not intended to replace advice given to you by your health care provider. Make sure you discuss any questions you have with your health care provider. Diabetes and Exercise Exercising regularly is important. It is not just about losing weight. It has many health benefits, such as: 9. Improving your overall fitness, flexibility, and endurance. 10. Increasing your bone density. 29. Helping with weight control. 12. Decreasing your body fat. 13. Increasing your  muscle strength. 14. Reducing stress and tension. 15. Improving your overall health. People with diabetes who exercise gain additional benefits because exercise:  Reduces appetite.  Improves the body's use of blood sugar (glucose).  Helps lower or control blood glucose.  Decreases blood pressure.  Helps control blood lipids (such as cholesterol and triglycerides).  Improves the body's use of the hormone insulin by:  Increasing the body's insulin sensitivity.  Reducing the body's insulin needs.  Decreases the risk for heart disease because exercising:  Lowers cholesterol and triglycerides levels.  Increases the levels of good cholesterol (such as high-density lipoproteins [HDL]) in the body.  Lowers blood glucose levels. YOUR ACTIVITY PLAN  Choose an activity that you enjoy and set realistic goals. Your health care provider or diabetes educator can help you make an activity plan that works for you. Exercise regularly as directed by your health care provider. This includes:  Performing resistance training twice a week such as push-ups, sit-ups, lifting weights, or using resistance bands.  Performing 150 minutes of cardio exercises each week such as walking, running, or playing sports.  Staying active and spending no more than 90 minutes at one time being inactive. Even short bursts of exercise are good for you. Three 10-minute sessions spread throughout the day are just as beneficial as a single 30-minute session. Some exercise ideas include:  Taking the dog for a walk.  Taking the stairs instead of the elevator.  Dancing to your favorite song.  Doing an exercise video.  Doing your favorite exercise with a friend. RECOMMENDATIONS FOR EXERCISING WITH TYPE 1 OR TYPE 2 DIABETES   Check your blood glucose before exercising. If blood glucose levels are greater than 240 mg/dL, check for urine ketones. Do not exercise if ketones are present.  Avoid injecting insulin into  areas of the body that are going to be exercised. For example, avoid injecting insulin into:  The arms when playing tennis.  The legs when jogging.  Keep a record of:  Food intake before and after you exercise.  Expected peak times of insulin action.  Blood glucose levels before and after you exercise.  The type and amount of exercise you have done.  Review your records with your health care provider. Your health care provider will help you to develop guidelines for adjusting food intake and insulin amounts before and after exercising.  If you take insulin or oral hypoglycemic agents, watch for signs and symptoms of hypoglycemia. They include:  Dizziness.  Shaking.  Sweating.  Chills.  Confusion.  Drink plenty of water while you exercise to prevent dehydration or heat stroke. Body water is lost during exercise and must be replaced.  Talk to your health care provider before starting an exercise program to make sure it is safe for you. Remember,  almost any type of activity is better than none. Document Released: 06/18/2003 Document Revised: 08/12/2013 Document Reviewed: 09/04/2012 [facility] Patient Information 2015 [place], [place]. This information is not intended to replace advice given to you by your health care provider. Make sure you discuss any questions you have with your health care provider. Basic Carbohydrate Counting for Diabetes Mellitus Carbohydrate counting is a method for keeping track of the amount of carbohydrates you eat. Eating carbohydrates naturally increases the level of sugar (glucose) in your blood, so it is important for you to know the amount that is okay for you to have in every meal. Carbohydrate counting helps keep the level of glucose in your blood within normal limits. The amount of carbohydrates allowed is different for every person. A dietitian can help you calculate the amount that is right for you. Once you know the amount of carbohydrates you can  have, you can count the carbohydrates in the foods you want to eat. Carbohydrates are found in the following foods: 16. Grains, such as breads and cereals. 17. Dried beans and soy products. 18. Starchy vegetables, such as potatoes, peas, and corn. 19. Fruit and fruit juices. 20. Milk and yogurt. 21. Sweets and snack foods, such as cake, cookies, candy, chips, soft drinks, and fruit drinks. CARBOHYDRATE COUNTING There are two ways to count the carbohydrates in your food. You can use either of the methods or a combination of both. Reading the "Nutrition Facts" on [place] The "Nutrition Facts" is an area that is included on the labels of almost all packaged food and beverages in the [place]. It includes the serving size of that food or beverage and information about the nutrients in each serving of the food, including the grams (g) of carbohydrate per serving.  Decide the number of servings of this food or beverage that you will be able to eat or drink. Multiply that number of servings by the number of grams of carbohydrate that is listed on the label for that serving. The total will be the amount of carbohydrates you will be having when you eat or drink this food or beverage. Learning Standard Serving Sizes of Food When you eat food that is not packaged or does not include "Nutrition Facts" on the label, you need to measure the servings in order to count the amount of carbohydrates.A serving of most carbohydrate-rich foods contains about 15 g of carbohydrates. The following list includes serving sizes of carbohydrate-rich foods that provide 15 g ofcarbohydrate per serving:   1 slice of bread (1 oz) or 1 six-inch tortilla.    of a hamburger bun or English muffin.  4-6 crackers.   cup unsweetened dry cereal.    cup hot cereal.   cup rice or pasta.    cup mashed potatoes or  of a large baked potato.  1 cup fresh fruit or one small piece of fruit.    cup canned or  frozen fruit or fruit juice.  1 cup milk.   cup plain fat-free yogurt or yogurt sweetened with artificial sweeteners.   cup cooked dried beans or starchy vegetable, such as peas, corn, or potatoes.  Decide the number of standard-size servings that you will eat. Multiply that number of servings by 15 (the grams of carbohydrates in that serving). For example, if you eat 2 cups of strawberries, you will have eaten 2 servings and 30 g of carbohydrates (2 servings x 15 g = 30 g). For foods such as soups and casseroles,  in which more than one food is mixed in, you will need to count the carbohydrates in each food that is included. EXAMPLE OF CARBOHYDRATE COUNTING Sample Dinner  3 oz chicken breast.   cup of brown rice.   cup of corn.  1 cup milk.   1 cup strawberries with sugar-free whipped topping.  Carbohydrate Calculation Step 1: Identify the foods that contain carbohydrates:   Rice.   Corn.   Milk.   Strawberries. Step 2:Calculate the number of servings eaten of each:   2 servings of rice.   1 serving of corn.   1 serving of milk.   1 serving of strawberries. Step 3: Multiply each of those number of servings by 15 g:   2 servings of rice x 15 g = 30 g.   1 serving of corn x 15 g = 15 g.   1 serving of milk x 15 g = 15 g.   1 serving of strawberries x 15 g = 15 g. Step 4: Add together all of the amounts to find the total grams of carbohydrates eaten: 30 g + 15 g + 15 g + 15 g = 75 g. Document Released: 03/28/2005 Document Revised: 08/12/2013 Document Reviewed: 02/22/2013 [facility] Patient Information 2015 [place], [place]. This information is not intended to replace advice given to you by your health care provider. Make sure you discuss any questions you have with your health care provider.

## 2014-06-30 NOTE — Progress Notes (Signed)
Patient ID: Kendra Griffith, female   DOB: 1965-06-19, 49 y.o.   MRN: [id number]   Kendra Griffith, is a 49 y.o. female  [id number]  [id number]  DOB - Dec 17, 1965  Chief Complaint  Patient presents with  . Follow-up  . Diabetes  . Hypertension  . Depression        Subjective:   Kendra Griffith is a 49 y.o. female here today for a follow up visit for HTN, Diabetes and back pain Pt is c/o intermittent numbness sensation all over. States she is taking prescribed medications. C/o increased anxiety due to at home stressors. Pt currently smoking 2 packs of cigarettes per day. Need Flu/PNA vaccine. Patient has No headache, No chest pain, No abdominal pain - No Nausea, No new weakness tingling or numbness, No Cough - SOB.  Problem  Type 2 Diabetes Mellitus Without Complication  Depression    ALLERGIES: Allergies  Allergen Reactions  . Darvocet [Propoxyphene N-Acetaminophen]     Makes her jittery  . Hydrocodone-Acetaminophen     REACTION: Upset stomach  . Percocet [Oxycodone-Acetaminophen]     Makes her jittery  . Valium [Diazepam] Other (See Comments)    UNKNOWN Makes pt. Feel "out of wack"    PAST MEDICAL HISTORY: Past Medical History  Diagnosis Date  . Anxiety   . Depression   . Hypertension   . Anemia   . Pre-diabetes     MEDICATIONS AT HOME: Prior to Admission medications   Medication Sig Start Date End Date Taking? Authorizing Provider  famotidine (PEPCID) 20 MG tablet Take 1 tablet (20 mg total) by mouth 2 (two) times daily. For Reflux 01/08/14  Yes Tresa Garter, MD  gabapentin (NEURONTIN) 300 MG capsule Take 2 capsules  Three times daily 06/30/14  Yes Claudette Wermuth E Doreene Burke, MD  hydrochlorothiazide (HYDRODIURIL) 12.5 MG tablet Take 1 tablet (12.5 mg total) by mouth daily. 06/30/14  Yes Tresa Garter, MD  metFORMIN (GLUCOPHAGE) 500 MG tablet Take 1 tablet (500 mg total) by mouth daily with breakfast. 06/30/14  Yes Tresa Garter, MD    acetaminophen-codeine (TYLENOL #3) 300-30 MG per tablet Take 1 tablet by mouth every 4 (four) hours as needed for pain. Patient not taking: Reported on 03/05/2014 02/01/13   Tresa Garter, MD  buPROPion (WELLBUTRIN XL) 150 MG 24 hr tablet Take 1 tablet (150 mg total) by mouth daily. 06/30/14   Tresa Garter, MD  ibuprofen (ADVIL,MOTRIN) 800 MG tablet Take 1 tablet (800 mg total) by mouth every 8 (eight) hours as needed. Patient not taking: Reported on 06/30/2014 05/22/13   Tresa Garter, MD  metroNIDAZOLE (FLAGYL) 500 MG tablet Take 1 tablet (500 mg total) by mouth 2 (two) times daily. Patient not taking: Reported on 03/05/2014 12/26/12   Mora Bellman, MD  promethazine (PHENERGAN) 12.5 MG tablet Take 1 tablet (12.5 mg total) by mouth every 6 (six) hours as needed (Take 30 minutes prior to Tramadol to help prevent nausea.). Patient not taking: Reported on 03/05/2014 10/04/12   Venetia Maxon Rama, MD  traMADol (ULTRAM) 50 MG tablet Take 1 tablet (50 mg total) by mouth every 8 (eight) hours as needed for pain. Patient not taking: Reported on 03/05/2014 08/22/12   Thurman Coyer, DO  vitamin B-12 (CYANOCOBALAMIN) 250 MCG tablet Take 1 tablet (250 mcg total) by mouth daily. 06/30/14   Tresa Garter, MD     Objective:   Filed Vitals:   06/30/14 1434  BP: 108/74  Pulse: 98  Temp:  99.8 F (37.7 C)  TempSrc: Oral  Resp: 16  Height: 5\' 4"  (1.626 m)  Weight: 168 lb (76.204 kg)  SpO2: 100%    Exam General appearance : Awake, alert, not in any distress. Speech Clear. Not toxic looking HEENT: Atraumatic and Normocephalic, pupils equally reactive to light and accomodation Neck: supple, no JVD. No cervical lymphadenopathy.  Chest:Good air entry bilaterally, no added sounds  CVS: S1 S2 regular, no murmurs.  Abdomen: Bowel sounds present, Non tender and not distended with no gaurding, rigidity or rebound. Extremities: B/L Lower Ext shows no edema, both legs are warm to  touch Neurology: Awake alert, and oriented X 3, CN II-XII intact, Non focal Skin:No Rash  Data Review Lab Results  Component Value Date   HGBA1C 6.1 06/30/2014   HGBA1C 6.6* 01/02/2014     Assessment & Plan   1. Type 2 diabetes mellitus without complication  - Glucose (CBG) - HgB A1c - metFORMIN (GLUCOPHAGE) 500 MG tablet; Take 1 tablet (500 mg total) by mouth daily with breakfast.  Dispense: 90 tablet; Refill: 3  Aim for 30 minutes of exercise most days. Rethink what you drink. Water is great! Aim for 2-3 Carb Choices per meal (30-45 grams) +/- 1 either way  Aim for 0-15 Carbs per snack if hungry  Include protein in moderation with your meals and snacks  Consider reading food labels for Total Carbohydrate and Fat Grams of foods  Consider checking BG at alternate times per day  Continue taking medication as directed Be mindful about how much sugar you are adding to beverages and other foods. Fruit Punch - find one with no sugar  Measure and decrease portions of carbohydrate foods  Make your plate and don't go back for seconds  2. Essential hypertension, benign  - hydrochlorothiazide (HYDRODIURIL) 12.5 MG tablet; Take 1 tablet (12.5 mg total) by mouth daily.  Dispense: 90 tablet; Refill: 3  We have discussed target BP range and blood pressure goal. I have advised patient to check BP regularly and to call us back or report to clinic if the numbers are consistently higher than 140/90. We discussed the importance of compliance with medical therapy and DASH diet recommended, consequences of uncontrolled hypertension discussed.  - continue current BP medications  3. Depression  - buPROPion (WELLBUTRIN XL) 150 MG 24 hr tablet; Take 1 tablet (150 mg total) by mouth daily.  Dispense: 30 tablet; Refill: 3  4. Peripheral neuropathy  - gabapentin (NEURONTIN) 300 MG capsule; Take 2 capsules  Three times daily  Dispense: 120 capsule; Refill: 3 - vitamin B-12 (CYANOCOBALAMIN) 250  MCG tablet; Take 1 tablet (250 mcg total) by mouth daily.  Dispense: 90 tablet; Refill: 3  5. Smoking  - buPROPion (WELLBUTRIN XL) 150 MG 24 hr tablet; Take 1 tablet (150 mg total) by mouth daily.  Dispense: 30 tablet; Refill: 3  Kendra Griffith was counseled on the dangers of tobacco use, and was advised to quit. Reviewed strategies to maximize success, including removing cigarettes and smoking materials from environment, stress management and support of family/friends.   Patient have been counseled extensively about nutrition and exercise Return in about 3 months (around 09/30/2014), or if symptoms worsen or fail to improve, for Hemoglobin A1C and Follow up, DM, Follow up HTN.  The patient was given clear instructions to go to ER or return to medical center if symptoms don't improve, worsen or new problems develop. The patient verbalized understanding. The patient was told to call to get lab results  if they haven't heard anything in the next week.   This note has been created with Surveyor, quantity. Any transcriptional errors are unintentional.    Angelica Chessman, MD, [place], [place], [place], [place] and [place], [place]   06/30/2014, 3:40 PM

## 2014-07-31 ENCOUNTER — Ambulatory Visit: Payer: Self-pay | Attending: Internal Medicine | Admitting: Internal Medicine

## 2014-07-31 ENCOUNTER — Encounter: Payer: Self-pay | Admitting: Internal Medicine

## 2014-07-31 VITALS — BP 130/82 | HR 80 | Temp 99.0°F | Resp 16 | Ht 64.0 in | Wt 168.0 lb

## 2014-07-31 DIAGNOSIS — K088 Other specified disorders of teeth and supporting structures: Secondary | ICD-10-CM

## 2014-07-31 DIAGNOSIS — M25571 Pain in right ankle and joints of right foot: Secondary | ICD-10-CM

## 2014-07-31 DIAGNOSIS — K089 Disorder of teeth and supporting structures, unspecified: Secondary | ICD-10-CM

## 2014-07-31 NOTE — Patient Instructions (Signed)

## 2014-07-31 NOTE — Progress Notes (Signed)
Patient ID: Kendra Griffith, female   DOB: Aug 07, 1965, 49 y.o.   MRN: [id number]   Kendra Griffith, is a 49 y.o. female  HMC:[id number]  MOQ:[id number]  DOB - 1966/03/07  Chief Complaint  Patient presents with  . Foot Pain        Subjective:   Kendra Griffith is a 49 y.o. female here today for a follow up visit on her bilateral foot pain.Patient has history of hypertension, diabetes, chronic pain, major depression and vitamin B12 deficiency anemia. She had been seeing a podiatrist and pain MD In [facility] but had a falling out. She is scheduled for a financial appointment with Diane in May. Patient needs referral to podiatrist and dental for tooth pain. Patient has been smoking 1 ppd. Patient takes sublingual B12 2557mcg and she takes a multivitamin and wants to know if this is too much vitamin B. Patient needs refills on medication. Patient has No headache, No chest pain, No abdominal pain - No Nausea, No new weakness tingling or numbness, No Cough - SOB.  No problems updated.  ALLERGIES: Allergies  Allergen Reactions  . Darvocet [Propoxyphene N-Acetaminophen]     Makes her jittery  . Hydrocodone-Acetaminophen     REACTION: Upset stomach  . Percocet [Oxycodone-Acetaminophen]     Makes her jittery  . Valium [Diazepam] Other (See Comments)    UNKNOWN Makes pt. Feel "out of wack"    PAST MEDICAL HISTORY: Past Medical History  Diagnosis Date  . Anxiety   . Depression   . Hypertension   . Anemia   . Pre-diabetes     MEDICATIONS AT HOME: Prior to Admission medications   Medication Sig Start Date End Date Taking? Authorizing Provider  buPROPion (WELLBUTRIN XL) 150 MG 24 hr tablet Take 1 tablet (150 mg total) by mouth daily. 06/30/14  Yes Tresa Garter, MD  famotidine (PEPCID) 20 MG tablet Take 1 tablet (20 mg total) by mouth 2 (two) times daily. For Reflux 01/08/14  Yes Tresa Garter, MD  gabapentin (NEURONTIN) 300 MG capsule Take 2 capsules  Three times daily  06/30/14  Yes Chelcee Korpi E Doreene Burke, MD  hydrochlorothiazide (HYDRODIURIL) 12.5 MG tablet Take 1 tablet (12.5 mg total) by mouth daily. 06/30/14  Yes Tresa Garter, MD  metFORMIN (GLUCOPHAGE) 500 MG tablet Take 1 tablet (500 mg total) by mouth daily with breakfast. 06/30/14  Yes Tresa Garter, MD  vitamin B-12 (CYANOCOBALAMIN) 250 MCG tablet Take 1 tablet (250 mcg total) by mouth daily. 06/30/14  Yes Tresa Garter, MD  acetaminophen-codeine (TYLENOL #3) 300-30 MG per tablet Take 1 tablet by mouth every 4 (four) hours as needed for pain. Patient not taking: Reported on 03/05/2014 02/01/13   Tresa Garter, MD  ibuprofen (ADVIL,MOTRIN) 800 MG tablet Take 1 tablet (800 mg total) by mouth every 8 (eight) hours as needed. Patient not taking: Reported on 06/30/2014 05/22/13   Tresa Garter, MD  metroNIDAZOLE (FLAGYL) 500 MG tablet Take 1 tablet (500 mg total) by mouth 2 (two) times daily. Patient not taking: Reported on 03/05/2014 12/26/12   Mora Bellman, MD  promethazine (PHENERGAN) 12.5 MG tablet Take 1 tablet (12.5 mg total) by mouth every 6 (six) hours as needed (Take 30 minutes prior to Tramadol to help prevent nausea.). Patient not taking: Reported on 03/05/2014 10/04/12   Venetia Maxon Rama, MD  traMADol (ULTRAM) 50 MG tablet Take 1 tablet (50 mg total) by mouth every 8 (eight) hours as needed for pain. Patient not taking:  Reported on 03/05/2014 08/22/12   Thurman Coyer, DO     Objective:   Filed Vitals:   07/31/14 1551  BP: 130/82  Pulse: 80  Temp: 99 F (37.2 C)  Resp: 16  Height: 5\' 4"  (1.626 m)  Weight: 168 lb (76.204 kg)  SpO2: 99%    Exam General appearance : Awake, alert, not in any distress. Speech Clear. Not toxic looking HEENT: Atraumatic and Normocephalic, pupils equally reactive to light and accomodation Neck: supple, no JVD. No cervical lymphadenopathy.  Chest:Good air entry bilaterally, no added sounds  CVS: S1 S2 regular, no murmurs.  Abdomen:  Bowel sounds present, Non tender and not distended with no gaurding, rigidity or rebound. Extremities: B/L Lower Ext shows no edema, both legs are warm to touch Neurology: Awake alert, and oriented X 3, CN II-XII intact, Non focal Skin:No Rash  Data Review Lab Results  Component Value Date   HGBA1C 6.1 06/30/2014   HGBA1C 6.6* 01/02/2014     Assessment & Plan   1. Poor dentition  - Ambulatory referral to Dentistry  2. Pain in joint, ankle and foot, right  - Ambulatory referral to Pain Clinic  Kendra Griffith was counseled on the dangers of tobacco use, and was advised to quit. Reviewed strategies to maximize success, including removing cigarettes and smoking materials from environment, stress management and support of family/friends.  Patient have been counseled extensively about nutrition and exercise Return for Follow up Pain and comorbidities, Annual Physical.  The patient was given clear instructions to go to ER or return to medical center if symptoms don't improve, worsen or new problems develop. The patient verbalized understanding. The patient was told to call to get lab results if they haven't heard anything in the next week.   This note has been created with Surveyor, quantity. Any transcriptional errors are unintentional.    Kendra Chessman, MD, [place], [place], [place], [place] and [place], [place]   07/31/2014, 4:50 PM

## 2014-07-31 NOTE — Progress Notes (Signed)
Patient here to follow up on her bilateral foot pain.  She had been seeing a podiatrist and pain MD  In [facility] but had a falling out.  She is scheduled for a financial appointment with Diane in May Patient needs referral to podiatrist and dental for tooth pain Patient has been smoking 1 ppd Patient bottle of sublingual B12 2570mcg and she takes a multivitamin and wants to know if this is too much vitamin B? Patient needs refills on medication

## 2014-08-18 ENCOUNTER — Telehealth: Payer: Self-pay | Admitting: Internal Medicine

## 2014-08-18 NOTE — Telephone Encounter (Signed)
Pt calling to follow up on whether PCP received medical records that were discussed during last visit.

## 2014-08-19 ENCOUNTER — Ambulatory Visit: Payer: Self-pay

## 2014-08-19 ENCOUNTER — Encounter: Payer: Self-pay | Admitting: Internal Medicine

## 2014-08-19 ENCOUNTER — Ambulatory Visit: Payer: Self-pay | Attending: Internal Medicine | Admitting: Internal Medicine

## 2014-08-19 VITALS — BP 116/83 | HR 91 | Temp 97.9°F | Resp 16 | Ht 64.0 in | Wt 168.0 lb

## 2014-08-19 DIAGNOSIS — I10 Essential (primary) hypertension: Secondary | ICD-10-CM | POA: Insufficient documentation

## 2014-08-19 DIAGNOSIS — E119 Type 2 diabetes mellitus without complications: Secondary | ICD-10-CM | POA: Insufficient documentation

## 2014-08-19 DIAGNOSIS — Z114 Encounter for screening for human immunodeficiency virus [HIV]: Secondary | ICD-10-CM | POA: Insufficient documentation

## 2014-08-19 DIAGNOSIS — F329 Major depressive disorder, single episode, unspecified: Secondary | ICD-10-CM | POA: Insufficient documentation

## 2014-08-19 DIAGNOSIS — L84 Corns and callosities: Secondary | ICD-10-CM | POA: Insufficient documentation

## 2014-08-19 DIAGNOSIS — G894 Chronic pain syndrome: Secondary | ICD-10-CM | POA: Insufficient documentation

## 2014-08-19 DIAGNOSIS — K089 Disorder of teeth and supporting structures, unspecified: Secondary | ICD-10-CM | POA: Insufficient documentation

## 2014-08-19 DIAGNOSIS — K088 Other specified disorders of teeth and supporting structures: Secondary | ICD-10-CM

## 2014-08-19 LAB — GLUCOSE, POCT (MANUAL RESULT ENTRY): POC Glucose: 99 mg/dl (ref 70–99)

## 2014-08-19 LAB — POCT GLYCOSYLATED HEMOGLOBIN (HGB A1C): Hemoglobin A1C: 6.1

## 2014-08-19 NOTE — Progress Notes (Signed)
Pt is here following up on her HTN and diabetes. Pt states that her feet stay in constant severe pain. Pt needs her referrals to be sent again b/c the referrals has expired.

## 2014-08-19 NOTE — Patient Instructions (Signed)
Diabetes and Exercise Exercising regularly is important. It is not just about losing weight. It has many health benefits, such as:  Improving your overall fitness, flexibility, and endurance.  Increasing your bone density.  Helping with weight control.  Decreasing your body fat.  Increasing your muscle strength.  Reducing stress and tension.  Improving your overall health. People with diabetes who exercise gain additional benefits because exercise:  Reduces appetite.  Improves the body's use of blood sugar (glucose).  Helps lower or control blood glucose.  Decreases blood pressure.  Helps control blood lipids (such as cholesterol and triglycerides).  Improves the body's use of the hormone insulin by:  Increasing the body's insulin sensitivity.  Reducing the body's insulin needs.  Decreases the risk for heart disease because exercising:  Lowers cholesterol and triglycerides levels.  Increases the levels of good cholesterol (such as high-density lipoproteins [HDL]) in the body.  Lowers blood glucose levels. YOUR ACTIVITY PLAN  Choose an activity that you enjoy and set realistic goals. Your health care provider or diabetes educator can help you make an activity plan that works for you. Exercise regularly as directed by your health care provider. This includes:  Performing resistance training twice a week such as push-ups, sit-ups, lifting weights, or using resistance bands.  Performing 150 minutes of cardio exercises each week such as walking, running, or playing sports.  Staying active and spending no more than 90 minutes at one time being inactive. Even short bursts of exercise are good for you. Three 10-minute sessions spread throughout the day are just as beneficial as a single 30-minute session. Some exercise ideas include:  Taking the dog for a walk.  Taking the stairs instead of the elevator.  Dancing to your favorite song.  Doing an exercise  video.  Doing your favorite exercise with a friend. RECOMMENDATIONS FOR EXERCISING WITH TYPE 1 OR TYPE 2 DIABETES   Check your blood glucose before exercising. If blood glucose levels are greater than 240 mg/dL, check for urine ketones. Do not exercise if ketones are present.  Avoid injecting insulin into areas of the body that are going to be exercised. For example, avoid injecting insulin into:  The arms when playing tennis.  The legs when jogging.  Keep a record of:  Food intake before and after you exercise.  Expected peak times of insulin action.  Blood glucose levels before and after you exercise.  The type and amount of exercise you have done.  Review your records with your health care provider. Your health care provider will help you to develop guidelines for adjusting food intake and insulin amounts before and after exercising.  If you take insulin or oral hypoglycemic agents, watch for signs and symptoms of hypoglycemia. They include:  Dizziness.  Shaking.  Sweating.  Chills.  Confusion.  Drink plenty of water while you exercise to prevent dehydration or heat stroke. Body water is lost during exercise and must be replaced.  Talk to your health care provider before starting an exercise program to make sure it is safe for you. Remember, almost any type of activity is better than none. Document Released: 06/18/2003 Document Revised: 08/12/2013 Document Reviewed: 09/04/2012 ExitCare Patient Information 2015 [employer]. This information is not intended to replace advice given to you by your health care provider. Make sure you discuss any questions you have with your health care provider. Basic Carbohydrate Counting for Diabetes Mellitus Carbohydrate counting is a method for keeping track of the amount of carbohydrates you eat.   Eating carbohydrates naturally increases the level of sugar (glucose) in your blood, so it is important for you to know the amount that is  okay for you to have in every meal. Carbohydrate counting helps keep the level of glucose in your blood within normal limits. The amount of carbohydrates allowed is different for every person. A dietitian can help you calculate the amount that is right for you. Once you know the amount of carbohydrates you can have, you can count the carbohydrates in the foods you want to eat. Carbohydrates are found in the following foods:  Grains, such as breads and cereals.  Dried beans and soy products.  Starchy vegetables, such as potatoes, peas, and corn.  Fruit and fruit juices.  Milk and yogurt.  Sweets and snack foods, such as cake, cookies, candy, chips, soft drinks, and fruit drinks. CARBOHYDRATE COUNTING There are two ways to count the carbohydrates in your food. You can use either of the methods or a combination of both. Reading the "Nutrition Facts" on Packaged Food The "Nutrition Facts" is an area that is included on the labels of almost all packaged food and beverages in the [place]. It includes the serving size of that food or beverage and information about the nutrients in each serving of the food, including the grams (g) of carbohydrate per serving.  Decide the number of servings of this food or beverage that you will be able to eat or drink. Multiply that number of servings by the number of grams of carbohydrate that is listed on the label for that serving. The total will be the amount of carbohydrates you will be having when you eat or drink this food or beverage. Learning Standard Serving Sizes of Food When you eat food that is not packaged or does not include "Nutrition Facts" on the label, you need to measure the servings in order to count the amount of carbohydrates.A serving of most carbohydrate-rich foods contains about 15 g of carbohydrates. The following list includes serving sizes of carbohydrate-rich foods that provide 15 g ofcarbohydrate per serving:   1 slice of bread  (1 oz) or 1 six-inch tortilla.    of a hamburger bun or English muffin.  4-6 crackers.   cup unsweetened dry cereal.    cup hot cereal.   cup rice or pasta.    cup mashed potatoes or  of a large baked potato.  1 cup fresh fruit or one small piece of fruit.    cup canned or frozen fruit or fruit juice.  1 cup milk.   cup plain fat-free yogurt or yogurt sweetened with artificial sweeteners.   cup cooked dried beans or starchy vegetable, such as peas, corn, or potatoes.  Decide the number of standard-size servings that you will eat. Multiply that number of servings by 15 (the grams of carbohydrates in that serving). For example, if you eat 2 cups of strawberries, you will have eaten 2 servings and 30 g of carbohydrates (2 servings x 15 g = 30 g). For foods such as soups and casseroles, in which more than one food is mixed in, you will need to count the carbohydrates in each food that is included. EXAMPLE OF CARBOHYDRATE COUNTING Sample Dinner  3 oz chicken breast.   cup of brown rice.   cup of corn.  1 cup milk.   1 cup strawberries with sugar-free whipped topping.  Carbohydrate Calculation Step 1: Identify the foods that contain carbohydrates:   Rice.   Corn.     Milk.   Strawberries. Step 2:Calculate the number of servings eaten of each:   2 servings of rice.   1 serving of corn.   1 serving of milk.   1 serving of strawberries. Step 3: Multiply each of those number of servings by 15 g:   2 servings of rice x 15 g = 30 g.   1 serving of corn x 15 g = 15 g.   1 serving of milk x 15 g = 15 g.   1 serving of strawberries x 15 g = 15 g. Step 4: Add together all of the amounts to find the total grams of carbohydrates eaten: 30 g + 15 g + 15 g + 15 g = 75 g. Document Released: 03/28/2005 Document Revised: 08/12/2013 Document Reviewed: 02/22/2013 ExitCare Patient Information 2015 [employer]. This information is not intended to  replace advice given to you by your health care provider. Make sure you discuss any questions you have with your health care provider.  

## 2014-08-19 NOTE — Progress Notes (Signed)
Patient ID: TALAYLA DOYEL, female   DOB: [birth year], 49 y.o.   MRN: [id number]   Calee Nugent, is a 49 y.o. female  [id number]  [id number]  DOB - March 28, 1966  Chief Complaint  Patient presents with  . Follow-up        Subjective:   Destiney Sanabia is a 49 y.o. female here today for a follow up visit.  Patient has history of hypertension, diabetes mellitus, major depression and generalized anxiety , here today with multiple complaints. She complains of multiple calluses on her feet bilaterally and would like to be seen by podiatrist comment she requested a referral to a dentist for her poor general dentition,  She also requests to be tested for HIV and she wants to know her blood group in preparation for a new relationship possibly. Patient is compliant with her medications for hypertension and diabetes, she claims that both are well controlled at home , she is doing better with her emotions and moment , her medications helping. She is not as tearful as she used to be. She is still in generalized body pain but no medication is helping at this time, she would like to be referred to pain clinic. Patient has No headache, No chest pain, No abdominal pain - No Nausea, No new weakness tingling or numbness, No Cough - SOB.  Problem  Callus of Foot    ALLERGIES: Allergies  Allergen Reactions  . Darvocet [Propoxyphene N-Acetaminophen]     Makes her jittery  . Hydrocodone-Acetaminophen     REACTION: Upset stomach  . Percocet [Oxycodone-Acetaminophen]     Makes her jittery  . Valium [Diazepam] Other (See Comments)    UNKNOWN Makes pt. Feel "out of wack"    PAST MEDICAL HISTORY: Past Medical History  Diagnosis Date  . Anxiety   . Depression   . Hypertension   . Anemia   . Pre-diabetes     MEDICATIONS AT HOME: Prior to Admission medications   Medication Sig Start Date End Date Taking? Authorizing Provider  buPROPion (WELLBUTRIN XL) 150 MG 24 hr tablet Take 1 tablet (150 mg  total) by mouth daily. 06/30/14  Yes Tresa Garter, MD  famotidine (PEPCID) 20 MG tablet Take 1 tablet (20 mg total) by mouth 2 (two) times daily. For Reflux 01/08/14  Yes Tresa Garter, MD  gabapentin (NEURONTIN) 300 MG capsule Take 2 capsules  Three times daily 06/30/14  Yes Anis Cinelli E Doreene Burke, MD  hydrochlorothiazide (HYDRODIURIL) 12.5 MG tablet Take 1 tablet (12.5 mg total) by mouth daily. 06/30/14  Yes Tresa Garter, MD  metFORMIN (GLUCOPHAGE) 500 MG tablet Take 1 tablet (500 mg total) by mouth daily with breakfast. 06/30/14  Yes Tresa Garter, MD  vitamin B-12 (CYANOCOBALAMIN) 250 MCG tablet Take 1 tablet (250 mcg total) by mouth daily. 06/30/14  Yes Tresa Garter, MD  acetaminophen-codeine (TYLENOL #3) 300-30 MG per tablet Take 1 tablet by mouth every 4 (four) hours as needed for pain. Patient not taking: Reported on 03/05/2014 02/01/13   Tresa Garter, MD  ibuprofen (ADVIL,MOTRIN) 800 MG tablet Take 1 tablet (800 mg total) by mouth every 8 (eight) hours as needed. Patient not taking: Reported on 06/30/2014 05/22/13   Tresa Garter, MD  metroNIDAZOLE (FLAGYL) 500 MG tablet Take 1 tablet (500 mg total) by mouth 2 (two) times daily. Patient not taking: Reported on 03/05/2014 12/26/12   Mora Bellman, MD  promethazine (PHENERGAN) 12.5 MG tablet Take 1 tablet (12.5 mg total) by mouth  every 6 (six) hours as needed (Take 30 minutes prior to Tramadol to help prevent nausea.). Patient not taking: Reported on 03/05/2014 10/04/12   Venetia Maxon Rama, MD  traMADol (ULTRAM) 50 MG tablet Take 1 tablet (50 mg total) by mouth every 8 (eight) hours as needed for pain. Patient not taking: Reported on 03/05/2014 08/22/12   Thurman Coyer, DO     Objective:   Filed Vitals:   08/19/14 1239  BP: 116/83  Pulse: 91  Temp: 97.9 F (36.6 C)  TempSrc: Oral  Resp: 16  Height: 5\' 4"  (1.626 m)  Weight: 168 lb (76.204 kg)  SpO2: 97%    Exam General appearance : Awake,  alert, not in any distress. Speech Clear. Not toxic looking , poor dentition HEENT: Atraumatic and Normocephalic, pupils equally reactive to light and accomodation Neck: supple, no JVD. No cervical lymphadenopathy.  Chest:Good air entry bilaterally, no added sounds  CVS: S1 S2 regular, no murmurs.  Abdomen: Bowel sounds present, Non tender and not distended with no gaurding, rigidity or rebound. Extremities: B/L Lower Ext shows no edema, both legs are warm to touch Neurology: Awake alert, and oriented X 3, CN II-XII intact, Non focal Skin:No Rash  Data Review Lab Results  Component Value Date   HGBA1C 6.10 08/19/2014   HGBA1C 6.1 06/30/2014   HGBA1C 6.6* 01/02/2014     Assessment & Plan   1. Type 2 diabetes mellitus without complication  - Glucose (CBG) - HgB A1c  Aim for 2-3 Carb Choices per meal (30-45 grams) +/- 1 either way  Aim for 0-15 Carbs per snack if hungry  Include protein in moderation with your meals and snacks  Consider reading food labels for Total Carbohydrate and Fat Grams of foods  Consider checking BG at alternate times per day  Continue taking medication as directed Fruit Punch - find one with no sugar  Measure and decrease portions of carbohydrate foods  Make your plate and don't go back for seconds   2. Essential hypertension, benign  - HIV antibody (with reflex) - Type and screen; Future  - We have discussed target BP range and blood pressure goal - I have advised patient to check BP regularly and to call us back or report to clinic if the numbers are consistently higher than 140/90  - We discussed the importance of compliance with medical therapy and DASH diet recommended, consequences of uncontrolled hypertension discussed.  - continue current BP medications  3. Poor dentition  - Ambulatory referral to Dentistry  4. Callus of foot  - Ambulatory referral to Podiatry  5. Chronic pain syndrome  - Ambulatory referral to Pain  Clinic  Patient have been counseled extensively about nutrition and exercise Return in about 6 months (around 02/19/2015) for Hemoglobin A1C and Follow up, DM, Follow up HTN, Annual Physical.  The patient was given clear instructions to go to ER or return to medical center if symptoms don't improve, worsen or new problems develop. The patient verbalized understanding. The patient was told to call to get lab results if they haven't heard anything in the next week.   This note has been created with Surveyor, quantity. Any transcriptional errors are unintentional.    Angelica Chessman, MD, [place], [place], [place], [place] and [place], [place]   08/19/2014, 1:07 PM

## 2014-08-20 LAB — HIV ANTIBODY (ROUTINE TESTING W REFLEX): HIV 1&2 Ab, 4th Generation: NONREACTIVE

## 2014-08-21 ENCOUNTER — Ambulatory Visit: Payer: Self-pay

## 2014-08-26 ENCOUNTER — Telehealth: Payer: Self-pay | Admitting: Internal Medicine

## 2014-08-26 NOTE — Telephone Encounter (Signed)
Pt is requesting medical records from [facility] for Dr. Doreene Burke to review. She is hoping that Dr. Doreene Burke review visits that are in relation to visits from Dr Vickki Hearing her previous pcp in [phone]. She is hoping to talk about her psychiatric visits, medication intake as well as substance abuse, and childhood trauma. This is so that she can continue care here with Dr Doreene Burke. Please follow up with patient advising that Dr. Doreene Burke is able to see these records so that she can discuss it on her next appt. Pt states that she will call us to make an appointment to discuss these records for the purpose of obtaining a referral to see a specialist.

## 2014-08-27 ENCOUNTER — Ambulatory Visit: Payer: Self-pay | Admitting: Podiatry

## 2014-09-03 ENCOUNTER — Telehealth: Payer: Self-pay | Admitting: *Deleted

## 2014-09-03 NOTE — Telephone Encounter (Signed)
-----   Message from Tresa Garter, MD sent at 08/27/2014  5:01 PM EDT ----- Please inform patient that her HIV test is negative as of 08/19/2014.

## 2014-09-03 NOTE — Telephone Encounter (Signed)
Relayed results to patient. 

## 2014-09-10 ENCOUNTER — Other Ambulatory Visit: Payer: Self-pay | Admitting: Internal Medicine

## 2014-09-25 ENCOUNTER — Ambulatory Visit: Payer: Self-pay | Attending: Internal Medicine | Admitting: Internal Medicine

## 2014-09-25 ENCOUNTER — Encounter: Payer: Self-pay | Admitting: Internal Medicine

## 2014-09-25 VITALS — BP 105/72 | HR 96 | Temp 99.0°F | Resp 18 | Ht 65.5 in | Wt 163.6 lb

## 2014-09-25 DIAGNOSIS — I10 Essential (primary) hypertension: Secondary | ICD-10-CM

## 2014-09-25 DIAGNOSIS — F32A Depression, unspecified: Secondary | ICD-10-CM

## 2014-09-25 DIAGNOSIS — F329 Major depressive disorder, single episode, unspecified: Secondary | ICD-10-CM

## 2014-09-25 DIAGNOSIS — G894 Chronic pain syndrome: Secondary | ICD-10-CM | POA: Insufficient documentation

## 2014-09-25 DIAGNOSIS — F17200 Nicotine dependence, unspecified, uncomplicated: Secondary | ICD-10-CM

## 2014-09-25 DIAGNOSIS — Z720 Tobacco use: Secondary | ICD-10-CM

## 2014-09-25 MED ORDER — TRAMADOL HCL 50 MG PO TABS: 50.0000 mg | ORAL_TABLET | Freq: Three times a day (TID) | ORAL | Status: DC | PRN

## 2014-09-25 MED ORDER — IBUPROFEN 800 MG PO TABS: 800.0000 mg | ORAL_TABLET | Freq: Three times a day (TID) | ORAL | Status: DC | PRN

## 2014-09-25 MED ORDER — AMITRIPTYLINE HCL 50 MG PO TABS: 50.0000 mg | ORAL_TABLET | Freq: Every day | ORAL | Status: DC

## 2014-09-25 NOTE — Progress Notes (Signed)
Patient here to review old medical records. Patient concerned that their may be something in her records that explains "why she is the way she is". Patient would like to review records from [phone].   Patient reports pain in both feet at a 10. Patient describes pain as stabbing and throbbing. Patient feet are numb in certain spots and reports that they get very cold. Patient already has appt with podiatrist. 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 Patient request something different for it.   Patient has question concerning vitamin B-12 in reference to if she could take only half of it because she is taking biotin.   Patient BG 96. Patient had noodles at noon. Patient did take diabetes medication today.

## 2014-09-25 NOTE — Progress Notes (Signed)
Patient ID: Kendra Griffith, female   DOB: 12/05/1965, 49 y.o.   MRN: [id number]   Julie Nay, is a 49 y.o. female  [id number]  [id number]  DOB - 1965/05/20  No chief complaint on file.       Subjective:   Kendra Griffith is a 49 y.o. female here today for a follow up visit.  Patient is here today for medical record review, she thinks there may be something in her record that can point to her current medical conditions.  The record was received in this office recently but currently at the [place]. Patient has no new complaint today except for ongoing pain and major depression. She has a lot of life stressors especially at home. She was started on Wellbutrin to help with depression and smoking cessation , the patient claims it's not helping either these conditions , she would like to try something new. She continues to smoke heavily. Patient has No headache, No chest pain, No abdominal pain - No Nausea, No new weakness tingling or numbness, No Cough - SOB.  Problem  Smoking  Chronic Pain Syndrome    ALLERGIES: Allergies  Allergen Reactions  . Darvocet [Propoxyphene N-Acetaminophen]     Makes her jittery  . Hydrocodone-Acetaminophen     REACTION: Upset stomach  . Percocet [Oxycodone-Acetaminophen]     Makes her jittery  . Valium [Diazepam] Other (See Comments)    UNKNOWN Makes pt. Feel "out of wack"    PAST MEDICAL HISTORY: Past Medical History  Diagnosis Date  . Anxiety   . Depression   . Hypertension   . Anemia   . Pre-diabetes     MEDICATIONS AT HOME: Prior to Admission medications   Medication Sig Start Date End Date Taking? Authorizing Provider  buPROPion (WELLBUTRIN XL) 150 MG 24 hr tablet Take 1 tablet (150 mg total) by mouth daily. 06/30/14  Yes Tresa Garter, MD  famotidine (PEPCID) 20 MG tablet Take 1 tablet (20 mg total) by mouth 2 (two) times daily. For Reflux 01/08/14  Yes Tresa Garter, MD  gabapentin (NEURONTIN) 300 MG capsule  Take 2 capsules  Three times daily 06/30/14  Yes Voncille Simm E Doreene Burke, MD  hydrochlorothiazide (HYDRODIURIL) 12.5 MG tablet Take 1 tablet (12.5 mg total) by mouth daily. 06/30/14  Yes Tresa Garter, MD  metFORMIN (GLUCOPHAGE) 500 MG tablet Take 1 tablet (500 mg total) by mouth daily with breakfast. 06/30/14  Yes Tresa Garter, MD  acetaminophen-codeine (TYLENOL #3) 300-30 MG per tablet Take 1 tablet by mouth every 4 (four) hours as needed for pain. Patient not taking: Reported on 03/05/2014 02/01/13   Tresa Garter, MD  amitriptyline (ELAVIL) 50 MG tablet Take 1 tablet (50 mg total) by mouth at bedtime. 09/25/14   Tresa Garter, MD  ibuprofen (ADVIL,MOTRIN) 800 MG tablet Take 1 tablet (800 mg total) by mouth every 8 (eight) hours as needed. 09/25/14   Tresa Garter, MD  metroNIDAZOLE (FLAGYL) 500 MG tablet Take 1 tablet (500 mg total) by mouth 2 (two) times daily. Patient not taking: Reported on 03/05/2014 12/26/12   Mora Bellman, MD  promethazine (PHENERGAN) 12.5 MG tablet Take 1 tablet (12.5 mg total) by mouth every 6 (six) hours as needed (Take 30 minutes prior to Tramadol to help prevent nausea.). Patient not taking: Reported on 03/05/2014 10/04/12   Venetia Maxon Rama, MD  traMADol (ULTRAM) 50 MG tablet Take 1 tablet (50 mg total) by mouth every 8 (eight) hours as needed. 09/25/14  Tresa Garter, MD  vitamin B-12 (CYANOCOBALAMIN) 250 MCG tablet Take 1 tablet (250 mcg total) by mouth daily. Patient not taking: Reported on 09/25/2014 06/30/14   Tresa Garter, MD     Objective:   Filed Vitals:   09/25/14 1425 09/25/14 1435  BP:  105/72  Pulse:  96  Temp:  99 F (37.2 C)  TempSrc:  Oral  Resp:  18  Height: 5' 5.5" (1.664 m)   Weight: 163 lb 9.6 oz (74.208 kg)   SpO2:  98%    Exam General appearance : Awake, alert, not in any distress. Speech Clear. Not toxic looking HEENT: Atraumatic and Normocephalic, pupils equally reactive to light and  accomodation Neck: supple, no JVD. No cervical lymphadenopathy.  Chest:Good air entry bilaterally, no added sounds  CVS: S1 S2 regular, no murmurs.  Abdomen: Bowel sounds present, Non tender and not distended with no gaurding, rigidity or rebound. Extremities: B/L Lower Ext shows no edema, both legs are warm to touch Neurology: Awake alert, and oriented X 3, CN II-XII intact, Non focal Skin:No Rash  Data Review Lab Results  Component Value Date   HGBA1C 6.10 08/19/2014   HGBA1C 6.1 06/30/2014   HGBA1C 6.6* 01/02/2014     Assessment & Plan   1. Essential hypertension, benign  - We have discussed target BP range and blood pressure goal - I have advised patient to check BP regularly and to call us back or report to clinic if the numbers are consistently higher than 140/90  - We discussed the importance of compliance with medical therapy and DASH diet recommended, consequences of uncontrolled hypertension discussed.  - continue current BP medications  2. Depression  - amitriptyline (ELAVIL) 50 MG tablet; Take 1 tablet (50 mg total) by mouth at bedtime.  Dispense: 30 tablet; Refill: 3 -  Patient was referred to our  Licensed clinical social worker here in the clinic today for counseling.  3. Smoking  The patient was counseled on the dangers of tobacco use, and was advised to quit. Reviewed strategies to maximize success, including removing cigarettes and smoking materials from environment, stress management and support of family/friends.   4. Chronic pain syndrome  Start - amitriptyline (ELAVIL) 50 MG tablet; Take 1 tablet (50 mg total) by mouth at bedtime.  Dispense: 30 tablet; Refill: 3 - traMADol (ULTRAM) 50 MG tablet; Take 1 tablet (50 mg total) by mouth every 8 (eight) hours as needed.  Dispense: 60 tablet; Refill: 0 - ibuprofen (ADVIL,MOTRIN) 800 MG tablet; Take 1 tablet (800 mg total) by mouth every 8 (eight) hours as needed.  Dispense: 30 tablet; Refill: 0  Patient have  been counseled extensively about nutrition and exercise Return if symptoms worsen or fail to improve, for Generalized Anxiety Disorder, Depression, Follow up Pain and comorbidities.  The patient was given clear instructions to go to ER or return to medical center if symptoms don't improve, worsen or new problems develop. The patient verbalized understanding. The patient was told to call to get lab results if they haven't heard anything in the next week.   This note has been created with Surveyor, quantity. Any transcriptional errors are unintentional.    Angelica Chessman, MD, [place], [place], [place], [place] and [place], [place]   09/25/2014, 3:22 PM

## 2014-09-25 NOTE — Progress Notes (Signed)
ASSESSMENT: Pt currently experiencing psychosocial stressors related to inadequate housing. Pt needs to continue taking her BH medications and F/U with PCP. Pt would benefit from community resources, and f/u w [facility]. Stage of Change: contemplation  PLAN: 1. F/U with behavioral health consultant in as needed 2. Psychiatric Medications: Wellbutrim. 3. Behavioral recommendation(s):   -Consider calling [facility] and/or make appointment for f/u -Consider calling housing hotline with further inquiry  SUBJECTIVE: Pt. referred by Dr Jarold Song for psychosocial issues:  Pt. here for referral regarding psychosocial issues, ongoing.  Pt. reports the following symptoms/concerns: Pt states that she is experiencing a lot of stress because she feels she may be at risk of losing her housing; she has not received a foreclosure notice, but repairs are not being made, and was told by housing manager than they did not have money to pay for outside lights. Duration of problem: three years  Severity: mild  OBJECTIVE: Orientation & Cognition: Oriented x3. Thought processes normal and appropriate to situation. Mood: appropriate. Affect: appropriate Appearance: appropriate Risk of harm to self or others: no risk of harm to self or others Substance use: none Psychiatric medication use: Unchanged from prior contact. Assessments administered: none  Diagnosis: Problem related to psychosocial circumstances CPT Code: Z65.9 -------------------------------------------- Other(s) present in the room: none  Time spent with patient in exam room: 20 minutes

## 2014-09-25 NOTE — Patient Instructions (Signed)

## 2014-10-01 ENCOUNTER — Ambulatory Visit (INDEPENDENT_AMBULATORY_CARE_PROVIDER_SITE_OTHER): Payer: No Typology Code available for payment source | Admitting: Podiatry

## 2014-10-01 ENCOUNTER — Encounter: Payer: Self-pay | Admitting: Podiatry

## 2014-10-01 VITALS — BP 117/77 | HR 85 | Temp 98.4°F | Resp 14

## 2014-10-01 DIAGNOSIS — B351 Tinea unguium: Secondary | ICD-10-CM

## 2014-10-01 DIAGNOSIS — G629 Polyneuropathy, unspecified: Secondary | ICD-10-CM

## 2014-10-01 DIAGNOSIS — Q828 Other specified congenital malformations of skin: Secondary | ICD-10-CM

## 2014-10-01 NOTE — Progress Notes (Signed)
   Subjective:    Patient ID: Kendra Griffith, female    DOB: 1965-05-29, 49 y.o.   MRN: [id number]  HPI  N- dull, achy L -B/L feet  D-2002,after surgery O-gradual c-worse A- water, wearing shoes T-none  Patient presents here for B/L chronic feet pain and B/L spots on bottom of feet, left under second toe, right under great and 5th toe. She describes a rather diffuse upper and lower extremity pain for multiple years Patient has a history of foot surgery 2002  As a history of alcohol abuse, however, has been sober for approximately 3 years    Review of Systems  Constitutional: Positive for chills, activity change, appetite change, fatigue and unexpected weight change.  HENT: Positive for ear pain.   Cardiovascular: Positive for palpitations.       Calf pain with walking  Endocrine: Positive for cold intolerance.       Excessive thirst, increase urination  Genitourinary: Positive for urgency and frequency.  Musculoskeletal: Positive for back pain and gait problem.  Neurological: Positive for dizziness, weakness, light-headedness and numbness.  Psychiatric/Behavioral: Positive for behavioral problems and confusion. The patient is nervous/anxious.        Objective:   Physical Exam  Orientated 3  Vascular: DP and PT pulses 2/4 bilaterally Capillary reflex immediate bilaterally No peripheral edema noted bilaterally  Neurological: Sensation to 10 g monofilament wire 2/5 right and 1/5 left Vibratory sedation reactive bilaterally Ankle reflexes weekly reactive bilaterally  Dermatological: Toenails are normal trophic except fifth right and left which are hypertrophic and deformed Surgical scars medial first MPJs bilaterally Nucleated keratoses first and fifth MPJ right and second MPJ left  Musculoskeletal: There is no restriction ankle, subtalar, midtarsal joints bilaterally      Assessment & Plan:   Assessment: 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 I made aware that most likely her neuropathic pains were associated in the past with her alcohol abuse. She can take up further evaluation with pain management or neurologist. The porokeratosis 3 were debrided without any bleeding and packed with salinocaine with instructions to leave bandages on 2-5 days  Reappoint at patient's request

## 2014-10-01 NOTE — Patient Instructions (Signed)
If possible leave the bandages on the bottom right and left feet 2-5 days  Return as needed  Diabetes and Foot Care Diabetes may cause you to have problems because of poor blood supply (circulation) to your feet and legs. This may cause the skin on your feet to become thinner, break easier, and heal more slowly. Your skin may become dry, and the skin may peel and crack. You may also have nerve damage in your legs and feet causing decreased feeling in them. You may not notice minor injuries to your feet that could lead to infections or more serious problems. Taking care of your feet is one of the most important things you can do for yourself.  HOME CARE INSTRUCTIONS  Wear shoes at all times, even in the house. Do not go barefoot. Bare feet are easily injured.  Check your feet daily for blisters, cuts, and redness. If you cannot see the bottom of your feet, use a mirror or ask someone for help.  Wash your feet with warm water (do not use hot water) and mild soap. Then pat your feet and the areas between your toes until they are completely dry. Do not soak your feet as this can dry your skin.  Apply a moisturizing lotion or petroleum jelly (that does not contain alcohol and is unscented) to the skin on your feet and to dry, brittle toenails. Do not apply lotion between your toes.  Trim your toenails straight across. Do not dig under them or around the cuticle. File the edges of your nails with an emery board or nail file.  Do not cut corns or calluses or try to remove them with medicine.  Wear clean socks or stockings every day. Make sure they are not too tight. Do not wear knee-high stockings since they may decrease blood flow to your legs.  Wear shoes that fit properly and have enough cushioning. To break in new shoes, wear them for just a few hours a day. This prevents you from injuring your feet. Always look in your shoes before you put them on to be sure there are no objects inside.  Do not  cross your legs. This may decrease the blood flow to your feet.  If you find a minor scrape, cut, or break in the skin on your feet, keep it and the skin around it clean and dry. These areas may be cleansed with mild soap and water. Do not cleanse the area with peroxide, alcohol, or iodine.  When you remove an adhesive bandage, be sure not to damage the skin around it.  If you have a wound, look at it several times a day to make sure it is healing.  Do not use heating pads or hot water bottles. They may burn your skin. If you have lost feeling in your feet or legs, you may not know it is happening until it is too late.  Make sure your health care provider performs a complete foot exam at least annually or more often if you have foot problems. Report any cuts, sores, or bruises to your health care provider immediately. SEEK MEDICAL CARE IF:   You have an injury that is not healing.  You have cuts or breaks in the skin.  You have an ingrown nail.  You notice redness on your legs or feet.  You feel burning or tingling in your legs or feet.  You have pain or cramps in your legs and feet.  Your legs or feet  are numb.  Your feet always feel cold. SEEK IMMEDIATE MEDICAL CARE IF:   There is increasing redness, swelling, or pain in or around a wound.  There is a red line that goes up your leg.  Pus is coming from a wound.  You develop a fever or as directed by your health care provider.  You notice a bad smell coming from an ulcer or wound. Document Released: 03/25/2000 Document Revised: 11/28/2012 Document Reviewed: 09/04/2012 [facility] Patient Information 2015 [place], [place]. This information is not intended to replace advice given to you by your health care provider. Make sure you discuss any questions you have with your health care provider.

## 2014-10-06 ENCOUNTER — Other Ambulatory Visit: Payer: Self-pay

## 2014-10-06 ENCOUNTER — Encounter: Payer: Self-pay | Admitting: Internal Medicine

## 2014-10-06 ENCOUNTER — Telehealth: Payer: Self-pay | Admitting: Internal Medicine

## 2014-10-06 NOTE — Telephone Encounter (Signed)
Pt calling returning call regarding referrals to [facility] and would like to know which one she has a appointment with. Please f/u with pt.

## 2014-10-07 NOTE — Telephone Encounter (Signed)
Lvm to patient to call me back because I don't see any appointments  Thank you

## 2014-12-29 ENCOUNTER — Telehealth: Payer: Self-pay | Admitting: Internal Medicine

## 2014-12-29 NOTE — Telephone Encounter (Signed)
Patient's transportation paperwork is in the filing folder.

## 2015-02-13 ENCOUNTER — Other Ambulatory Visit: Payer: Self-pay

## 2015-02-13 DIAGNOSIS — Z1231 Encounter for screening mammogram for malignant neoplasm of breast: Secondary | ICD-10-CM

## 2015-02-18 ENCOUNTER — Ambulatory Visit: Payer: Self-pay | Attending: Internal Medicine

## 2015-02-18 ENCOUNTER — Ambulatory Visit: Payer: No Typology Code available for payment source | Admitting: Podiatry

## 2015-03-10 ENCOUNTER — Ambulatory Visit
Admission: RE | Admit: 2015-03-10 | Discharge: 2015-03-10 | Disposition: A | Discharge status: Home / Self Care | Payer: No Typology Code available for payment source | Source: Ambulatory Visit

## 2015-03-10 DIAGNOSIS — Z1231 Encounter for screening mammogram for malignant neoplasm of breast: Secondary | ICD-10-CM

## 2015-03-13 ENCOUNTER — Telehealth: Payer: Self-pay | Admitting: *Deleted

## 2015-03-13 NOTE — Telephone Encounter (Signed)
-----   Message from Tresa Garter, MD sent at 03/11/2015 12:12 PM EST ----- Please inform patient that her screening mammogram shows no evidence of malignancy. Recommend screening mammogram in one year

## 2015-03-13 NOTE — Telephone Encounter (Signed)
Medical Assistant left message on patient's home and cell voicemail. Voicemail states to give a call back to Whitten Andreoni with [facility] at [phone].  

## 2015-03-16 NOTE — Telephone Encounter (Signed)
Patient returned phone call, please f/u with pt.    °

## 2015-03-17 NOTE — Telephone Encounter (Signed)
Medical Assistant left message on patient's home and cell voicemail. Voicemail states to give a call back to Hadassa Cermak with [facility] at [phone].  

## 2015-03-17 NOTE — Telephone Encounter (Signed)
Pt. Returned call. Please f/u with pt. °

## 2015-03-17 NOTE — Telephone Encounter (Signed)
Medical Assistant left message on patient's home and cell voicemail. Voicemail states to give a call back to [place] with [facility] at [phone].   PLEASE ASK PATIENT FOR A VALID NUMBER. FIRST CONTACT IS NOT VALID.  PLEASE INFORM PATIENT OF MAMMOGRAM SCREENING BEING NEGATIVE. ANOTHER SCREENING IS RECOMMENDED IN ONE YEAR.

## 2015-03-17 NOTE — Telephone Encounter (Signed)
Patient verified DOB Patient was informed of mammogram screening being negative for malignancy. Patient advised to have screening completed again in one year. Patient had no further questions and expressed her understanding.

## 2015-03-18 ENCOUNTER — Ambulatory Visit: Payer: No Typology Code available for payment source | Admitting: Podiatry

## 2015-03-20 ENCOUNTER — Telehealth: Payer: Self-pay | Admitting: *Deleted

## 2015-03-20 NOTE — Telephone Encounter (Signed)
Medical Assistant left message on patient's home and cell voicemail. Voicemail states to give a call back to Geriann Lafont with [facility] at [phone].  

## 2015-03-20 NOTE — Telephone Encounter (Signed)
-----   Message from Tresa Garter, MD sent at 03/11/2015 12:12 PM EST ----- Please inform patient that her screening mammogram shows no evidence of malignancy. Recommend screening mammogram in one year

## 2015-03-26 NOTE — Telephone Encounter (Signed)
Pt. Returned call. Please f/u with pt. °

## 2015-03-30 NOTE — Telephone Encounter (Signed)
Patient verified DOB Patient informed of mammogram screening showing no signs of malignancy. Patient advised to repeat screening in one year. Patient had no further questions.

## 2015-04-07 ENCOUNTER — Ambulatory Visit: Payer: No Typology Code available for payment source | Admitting: Podiatry

## 2015-04-28 ENCOUNTER — Telehealth (HOSPITAL_COMMUNITY): Payer: Self-pay | Admitting: *Deleted

## 2015-04-28 ENCOUNTER — Ambulatory Visit: Payer: No Typology Code available for payment source | Admitting: Podiatry

## 2015-04-28 NOTE — Telephone Encounter (Signed)
1135: Patient called me stating she has a Bartholin's cyst that she has had problems with for years. Patient states it is painful and draining. Patient has been to [facility] and has no insurance. Explained to patient that she will need to fill out financial assistance paperwork and can refer her to the [facility]. Told patient will make referral and someone will call her back with an appointment. Patient verbalized understanding.  1140: [place] and gave referral. Appointment scheduled for Wednesday, April 29, 2015 at 1315.  1142: Called patient back and left voicemail for patient to call me back.  1145: Patient called me back. Gave patient appointment at the Clinics for tomorrow and directions. Reminded patient to complete the financial assistance paperwork. Patient verbalized understanding.

## 2015-04-29 ENCOUNTER — Ambulatory Visit (INDEPENDENT_AMBULATORY_CARE_PROVIDER_SITE_OTHER): Payer: Self-pay | Admitting: Obstetrics and Gynecology

## 2015-04-29 ENCOUNTER — Encounter: Payer: Self-pay | Admitting: Obstetrics and Gynecology

## 2015-04-29 VITALS — BP 127/87 | HR 87 | Temp 98.6°F | Ht 65.0 in | Wt 165.0 lb

## 2015-04-29 DIAGNOSIS — N750 Cyst of Bartholin's gland: Secondary | ICD-10-CM

## 2015-04-29 NOTE — Progress Notes (Signed)
Patient ID: Kendra Griffith, female   DOB: 12-01-1965, 50 y.o.   MRN: [id number] 50 y.o. 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 Patient reports recurrence of bartholin's abscess over the past 28 years. She had one as recent as 3 months ago which drained spontaneously. She also reports irregular vaginal bleeding. She describes often skipping her menses and her menses are no longer every 28 days but much longer. She states her bleeding is much lighter than she is used to. She denies any pelvic pain. She is not sexually active  Past Medical History  Diagnosis Date  . Anxiety   . Depression   . Hypertension   . Anemia   . Pre-diabetes    Past Surgical History  Procedure Laterality Date  . Cesarean section    . Cyst removal 1997    . Facial reconstruction surgery    . Foot surgery Bilateral    Family History  Problem Relation Age of Onset  . Heart disease Mother   . Asthma Sister   . COPD Sister    Social History  Substance Use Topics  . Smoking status: Current Every Day Smoker -- 1.00 packs/day for 29 years    Types: Cigarettes  . Smokeless tobacco: Never Used     Comment: smokes 1-2 ppd  . Alcohol Use: No     Comment: Quit drinking in 2012.   ROS See pertinent in HPI  Blood pressure 127/87, pulse 87, temperature 98.6 F (37 C), temperature source Oral, height 5\' 5"  (1.651 m), weight 165 lb (74.844 kg), last menstrual period 03/06/2015. GENERAL: Well-developed, well-nourished female in no acute distress.  ABDOMEN: Soft, nontender, nondistended. No organomegaly. PELVIC: Normal external female genitalia. Vagina is pink and rugated.  Normal discharge. Normal appearing cervix. Uterus is normal in size. No adnexal mass or tenderness. No evidence of bartholin's abscess EXTREMITIES: No cyanosis, clubbing, or edema, 2+ distal pulses.  A/P 50 yo with h/o bartholin's abscess - Reassurance provided - Recommended marsupialization if it recurred but patient  is doubtful that it will be helpful - Informed patient that oligomenorrhea is expected in the perimenopausal state. She was advised to keep a menstrual calendar. Patient informed that prolonged bleeding and periods occurrying twice a month warrant evaluation - RTC prn

## 2015-05-26 ENCOUNTER — Encounter: Payer: Self-pay | Admitting: Podiatry

## 2015-05-26 ENCOUNTER — Ambulatory Visit (INDEPENDENT_AMBULATORY_CARE_PROVIDER_SITE_OTHER): Payer: No Typology Code available for payment source | Admitting: Podiatry

## 2015-05-26 DIAGNOSIS — Q828 Other specified congenital malformations of skin: Secondary | ICD-10-CM

## 2015-05-26 NOTE — Progress Notes (Signed)
Patient ID: Kendra Griffith, female   DOB: November 30, 1965, 50 y.o.   MRN: [id number]  Subjective: This patient presents today complaining of painful plantar skin lesions right and left feet  Objective: Nucleated keratoses plantar first and fifth MPJ right second MPJ left No open skin lesions bilaterally  Assessment: Peripheral neuropathy History of alcohol abuse Porokeratosis 3 Diabetic  Plan: [place] porokeratosis 3 without any bleeding. Apply salinocaine lesions 3 Remove acid dressings if possible and 3-5 days  Reappoint when necessary the patient's

## 2015-05-26 NOTE — Patient Instructions (Signed)
If possible leave acid bandages on on right and left feet 3-5 days Return as needed for debridement of painful corns on the bottom right and left feet  Diabetes and Foot Care Diabetes may cause you to have problems because of poor blood supply (circulation) to your feet and legs. This may cause the skin on your feet to become thinner, break easier, and heal more slowly. Your skin may become dry, and the skin may peel and crack. You may also have nerve damage in your legs and feet causing decreased feeling in them. You may not notice minor injuries to your feet that could lead to infections or more serious problems. Taking care of your feet is one of the most important things you can do for yourself.  HOME CARE INSTRUCTIONS  Wear shoes at all times, even in the house. Do not go barefoot. Bare feet are easily injured.  Check your feet daily for blisters, cuts, and redness. If you cannot see the bottom of your feet, use a mirror or ask someone for help.  Wash your feet with warm water (do not use hot water) and mild soap. Then pat your feet and the areas between your toes until they are completely dry. Do not soak your feet as this can dry your skin.  Apply a moisturizing lotion or petroleum jelly (that does not contain alcohol and is unscented) to the skin on your feet and to dry, brittle toenails. Do not apply lotion between your toes.  Trim your toenails straight across. Do not dig under them or around the cuticle. File the edges of your nails with an emery board or nail file.  Do not cut corns or calluses or try to remove them with medicine.  Wear clean socks or stockings every day. Make sure they are not too tight. Do not wear knee-high stockings since they may decrease blood flow to your legs.  Wear shoes that fit properly and have enough cushioning. To break in new shoes, wear them for just a few hours a day. This prevents you from injuring your feet. Always look in your shoes before you put  them on to be sure there are no objects inside.  Do not cross your legs. This may decrease the blood flow to your feet.  If you find a minor scrape, cut, or break in the skin on your feet, keep it and the skin around it clean and dry. These areas may be cleansed with mild soap and water. Do not cleanse the area with peroxide, alcohol, or iodine.  When you remove an adhesive bandage, be sure not to damage the skin around it.  If you have a wound, look at it several times a day to make sure it is healing.  Do not use heating pads or hot water bottles. They may burn your skin. If you have lost feeling in your feet or legs, you may not know it is happening until it is too late.  Make sure your health care provider performs a complete foot exam at least annually or more often if you have foot problems. Report any cuts, sores, or bruises to your health care provider immediately. SEEK MEDICAL CARE IF:   You have an injury that is not healing.  You have cuts or breaks in the skin.  You have an ingrown nail.  You notice redness on your legs or feet.  You feel burning or tingling in your legs or feet.  You have pain or cramps  in your legs and feet.  Your legs or feet are numb.  Your feet always feel cold. SEEK IMMEDIATE MEDICAL CARE IF:   There is increasing redness, swelling, or pain in or around a wound.  There is a red line that goes up your leg.  Pus is coming from a wound.  You develop a fever or as directed by your health care provider.  You notice a bad smell coming from an ulcer or wound.   This information is not intended to replace advice given to you by your health care provider. Make sure you discuss any questions you have with your health care provider.   Document Released: 03/25/2000 Document Revised: 11/28/2012 Document Reviewed: 09/04/2012 Elsevier Interactive Patient Education [employer].

## 2015-06-15 ENCOUNTER — Encounter: Payer: No Typology Code available for payment source | Admitting: Internal Medicine

## 2015-08-13 ENCOUNTER — Ambulatory Visit: Payer: No Typology Code available for payment source | Attending: Internal Medicine | Admitting: Internal Medicine

## 2015-08-13 ENCOUNTER — Encounter: Payer: Self-pay | Admitting: Internal Medicine

## 2015-08-13 VITALS — BP 140/94 | HR 77 | Temp 98.0°F | Resp 18 | Ht 65.0 in | Wt 167.0 lb

## 2015-08-13 DIAGNOSIS — F419 Anxiety disorder, unspecified: Secondary | ICD-10-CM | POA: Insufficient documentation

## 2015-08-13 DIAGNOSIS — F32A Depression, unspecified: Secondary | ICD-10-CM

## 2015-08-13 DIAGNOSIS — Z888 Allergy status to other drugs, medicaments and biological substances status: Secondary | ICD-10-CM | POA: Insufficient documentation

## 2015-08-13 DIAGNOSIS — I10 Essential (primary) hypertension: Secondary | ICD-10-CM | POA: Insufficient documentation

## 2015-08-13 DIAGNOSIS — Z7984 Long term (current) use of oral hypoglycemic drugs: Secondary | ICD-10-CM | POA: Insufficient documentation

## 2015-08-13 DIAGNOSIS — R7303 Prediabetes: Secondary | ICD-10-CM | POA: Insufficient documentation

## 2015-08-13 DIAGNOSIS — G894 Chronic pain syndrome: Secondary | ICD-10-CM | POA: Insufficient documentation

## 2015-08-13 DIAGNOSIS — F17210 Nicotine dependence, cigarettes, uncomplicated: Secondary | ICD-10-CM | POA: Insufficient documentation

## 2015-08-13 DIAGNOSIS — E1142 Type 2 diabetes mellitus with diabetic polyneuropathy: Secondary | ICD-10-CM | POA: Insufficient documentation

## 2015-08-13 DIAGNOSIS — F329 Major depressive disorder, single episode, unspecified: Secondary | ICD-10-CM | POA: Insufficient documentation

## 2015-08-13 DIAGNOSIS — Z79899 Other long term (current) drug therapy: Secondary | ICD-10-CM | POA: Insufficient documentation

## 2015-08-13 LAB — POCT GLYCOSYLATED HEMOGLOBIN (HGB A1C): Hemoglobin A1C: 6.1

## 2015-08-13 LAB — GLUCOSE, POCT (MANUAL RESULT ENTRY): POC GLUCOSE: 99 mg/dL (ref 70–99)

## 2015-08-13 MED ORDER — HYDROCHLOROTHIAZIDE 12.5 MG PO TABS: 12.5000 mg | ORAL_TABLET | Freq: Every day | ORAL | Status: DC

## 2015-08-13 MED ORDER — AMITRIPTYLINE HCL 50 MG PO TABS: 50.0000 mg | ORAL_TABLET | Freq: Every day | ORAL | Status: DC

## 2015-08-13 MED ORDER — GABAPENTIN 300 MG PO CAPS: 600.0000 mg | ORAL_CAPSULE | Freq: Three times a day (TID) | ORAL | Status: DC

## 2015-08-13 MED ORDER — METFORMIN HCL 500 MG PO TABS: 500.0000 mg | ORAL_TABLET | Freq: Every day | ORAL | Status: DC

## 2015-08-13 MED FILL — ?HYDROCHLOROTHIAZIDE 12.5MG: 12.5 | 30 days supply | Qty: 30 | Fill #0

## 2015-08-13 MED FILL — IBUPROFEN 800 MG TABLET: 800 | 10 days supply | Qty: 30 | Fill #0

## 2015-08-13 MED FILL — GABAPENTIN 300 MG CAPSULE: 300 | 30 days supply | Qty: 180 | Fill #0

## 2015-08-13 MED FILL — ?AMITRIPTYLINE HCL 50MG TAB: 50 | 30 days supply | Qty: 30 | Fill #0

## 2015-08-13 NOTE — Progress Notes (Signed)
Patient ID: ANEEKA JEN, female   DOB: [birth year], 50 y.o.   MRN: [id number]   Lynessa Kopplin, is a 50 y.o. female  [id number]  [id number]  DOB - Sep 04, 1965  Chief Complaint  Patient presents with  . Medication Management        Subjective:   Omega Rathmann is a 50 y.o. female with history of hypertension, prediabetes, chronic pain syndrome, anxiety and depression here today for a follow up visit and medication refill. She has no new complaint today, she follows up with [facility] and has an appointment tomorrow for counseling and medications. Patient has No headache, No chest pain, No abdominal pain - No Nausea, No new weakness tingling or numbness, No Cough - SOB. Patient continue to smoke cigarette about half to 1 pack per day. Patient denies any suicidal thoughts.  No problems updated.  ALLERGIES: Allergies  Allergen Reactions  . Darvocet [Propoxyphene N-Acetaminophen]     Makes her jittery  . Hydrocodone-Acetaminophen     REACTION: Upset stomach  . Percocet [Oxycodone-Acetaminophen]     Makes her jittery  . Valium [Diazepam] Other (See Comments)    UNKNOWN Makes pt. Feel "out of wack"    PAST MEDICAL HISTORY: Past Medical History  Diagnosis Date  . Anxiety   . Depression   . Hypertension   . Anemia   . Pre-diabetes     MEDICATIONS AT HOME: Prior to Admission medications   Medication Sig Start Date End Date Taking? Authorizing Provider  amitriptyline (ELAVIL) 50 MG tablet Take 1 tablet (50 mg total) by mouth at bedtime. 08/13/15  Yes Tresa Garter, MD  Biotin (BIOTIN MAXIMUM STRENGTH) 10 MG TABS Take 5,000 mg by mouth daily.    Yes Historical Provider, MD  famotidine (PEPCID) 20 MG tablet Take 1 tablet (20 mg total) by mouth 2 (two) times daily. For Reflux 01/08/14  Yes Tresa Garter, MD  gabapentin (NEURONTIN) 300 MG capsule Take 2 capsules (600 mg total) by mouth 3 (three) times daily. 08/13/15  Yes Tresa Garter, MD  hydrochlorothiazide  (HYDRODIURIL) 12.5 MG tablet Take 1 tablet (12.5 mg total) by mouth daily. 08/13/15  Yes Tresa Garter, MD  metFORMIN (GLUCOPHAGE) 500 MG tablet Take 1 tablet (500 mg total) by mouth daily with breakfast. 08/13/15  Yes Tresa Garter, MD     Objective:   Filed Vitals:   08/13/15 1239  BP: 140/94  Pulse: 77  Temp: 98 F (36.7 C)  TempSrc: Oral  Resp: 18  Height: 5\' 5"  (1.651 m)  Weight: 167 lb (75.751 kg)  SpO2: 100%    Exam General appearance : Awake, alert, not in any distress. Speech Clear. Not toxic looking, poor dentition HEENT: Atraumatic and Normocephalic, pupils equally reactive to light and accomodation Neck: supple, no JVD. No cervical lymphadenopathy.  Chest:Good air entry bilaterally, no added sounds  CVS: S1 S2 regular, no murmurs.  Abdomen: Bowel sounds present, Non tender and not distended with no gaurding, rigidity or rebound. Extremities: B/L Lower Ext shows no edema, both legs are warm to touch Neurology: Awake alert, and oriented X 3, CN II-XII intact, Non focal Skin: No Rash  Data Review Lab Results  Component Value Date   HGBA1C 6.1 08/13/2015   HGBA1C 6.10 08/19/2014   HGBA1C 6.1 06/30/2014     Assessment & Plan   1. Type 2 diabetes mellitus with diabetic polyneuropathy, without long-term current use of insulin (HCC)  - POCT A1C - Glucose (CBG) - Microalbumin/Creatinine Ratio, Urine  -  gabapentin (NEURONTIN) 300 MG capsule; Take 2 capsules (600 mg total) by mouth 3 (three) times daily.  Dispense: 180 capsule; Refill: 3 - metFORMIN (GLUCOPHAGE) 500 MG tablet; Take 1 tablet (500 mg total) by mouth daily with breakfast.  Dispense: 90 tablet; Refill: 3  Aim for 30 minutes of exercise most days. Rethink what you drink. Water is great! Aim for 2-3 Carb Choices per meal (30-45 grams) +/- 1 either way  Aim for 0-15 Carbs per snack if hungry  Include protein in moderation with your meals and snacks  Consider reading food labels for Total  Carbohydrate and Fat Grams of foods  Consider checking BG at alternate times per day  Continue taking medication as directed Be mindful about how much sugar you are adding to beverages and other foods. Fruit Punch - find one with no sugar  Measure and decrease portions of carbohydrate foods  Make your plate and don't go back for seconds  2. Essential hypertension, benign  - hydrochlorothiazide (HYDRODIURIL) 12.5 MG tablet; Take 1 tablet (12.5 mg total) by mouth daily.  Dispense: 90 tablet; Refill: 3  We have discussed target BP range and blood pressure goal. I have advised patient to check BP regularly and to call us back or report to clinic if the numbers are consistently higher than 140/90. We discussed the importance of compliance with medical therapy and DASH diet recommended, consequences of uncontrolled hypertension discussed.   - continue current BP medications  3. Depression  - amitriptyline (ELAVIL) 50 MG tablet; Take 1 tablet (50 mg total) by mouth at bedtime.  Dispense: 30 tablet; Refill: 3 - [facility] up with [facility] at [facility] tomorrow   4. Chronic pain syndrome  - amitriptyline (ELAVIL) 50 MG tablet; Take 1 tablet (50 mg total) by mouth at bedtime.  Dispense: 30 tablet; Refill: 3 - Ambulatory referral to Pain Clinic  Allexia was counseled on the dangers of tobacco use, and was advised to quit. Reviewed strategies to maximize success, including removing cigarettes and smoking materials from environment, stress management and support of family/friends.  Patient have been counseled extensively about nutrition and exercise  Return in about 6 months (around 02/13/2016) for Hemoglobin A1C and Follow up, DM, Follow up Pain and comorbidities, Follow up HTN.  The patient was given clear instructions to go to ER or return to medical center if symptoms don't improve, worsen or new problems develop. The patient verbalized understanding. The patient was told to call to get lab  results if they haven't heard anything in the next week.   This note has been created with Surveyor, quantity. Any transcriptional errors are unintentional.    Angelica Chessman, MD, [place], [place], [place], [place] and [place], [place]   08/13/2015, 1:05 PM

## 2015-08-13 NOTE — Patient Instructions (Signed)
DASH Eating Plan DASH stands for "Dietary Approaches to Stop Hypertension." The DASH eating plan is a healthy eating plan that has been shown to reduce high blood pressure (hypertension). Additional health benefits may include reducing the risk of type 2 diabetes mellitus, heart disease, and stroke. The DASH eating plan may also help with weight loss. WHAT DO I NEED TO KNOW ABOUT THE DASH EATING PLAN? For the DASH eating plan, you will follow these general guidelines:  Choose foods with a percent daily value for sodium of less than 5% (as listed on the food label).  Use salt-free seasonings or herbs instead of table salt or sea salt.  Check with your health care provider or pharmacist before using salt substitutes.  Eat lower-sodium products, often labeled as "lower sodium" or "no salt added."  Eat fresh foods.  Eat more vegetables, fruits, and low-fat dairy products.  Choose whole grains. Look for the word "whole" as the first word in the ingredient list.  Choose fish and skinless chicken or turkey more often than red meat. Limit fish, poultry, and meat to 6 oz (170 g) each day.  Limit sweets, desserts, sugars, and sugary drinks.  Choose heart-healthy fats.  Limit cheese to 1 oz (28 g) per day.  Eat more home-cooked food and less restaurant, buffet, and fast food.  Limit fried foods.  Cook foods using methods other than frying.  Limit canned vegetables. If you do use them, rinse them well to decrease the sodium.  When eating at a restaurant, ask that your food be prepared with less salt, or no salt if possible. WHAT FOODS CAN I EAT? Seek help from a dietitian for individual calorie needs. Grains Whole grain or whole wheat bread. Brown rice. Whole grain or whole wheat pasta. Quinoa, bulgur, and whole grain cereals. Low-sodium cereals. Corn or whole wheat flour tortillas. Whole grain cornbread. Whole grain crackers. Low-sodium crackers. Vegetables Fresh or frozen vegetables  (raw, steamed, roasted, or grilled). Low-sodium or reduced-sodium tomato and vegetable juices. Low-sodium or reduced-sodium tomato sauce and paste. Low-sodium or reduced-sodium canned vegetables.  Fruits All fresh, canned (in natural juice), or frozen fruits. Meat and Other Protein Products Ground beef (85% or leaner), grass-fed beef, or beef trimmed of fat. Skinless chicken or turkey. Ground chicken or turkey. Pork trimmed of fat. All fish and seafood. Eggs. Dried beans, peas, or lentils. Unsalted nuts and seeds. Unsalted canned beans. Dairy Low-fat dairy products, such as skim or 1% milk, 2% or reduced-fat cheeses, low-fat ricotta or cottage cheese, or plain low-fat yogurt. Low-sodium or reduced-sodium cheeses. Fats and Oils Tub margarines without trans fats. Light or reduced-fat mayonnaise and salad dressings (reduced sodium). Avocado. Safflower, olive, or canola oils. Natural peanut or almond butter. Other Unsalted popcorn and pretzels. The items listed above may not be a complete list of recommended foods or beverages. Contact your dietitian for more options. WHAT FOODS ARE NOT RECOMMENDED? Grains White bread. White pasta. White rice. Refined cornbread. Bagels and croissants. Crackers that contain trans fat. Vegetables Creamed or fried vegetables. Vegetables in a cheese sauce. Regular canned vegetables. Regular canned tomato sauce and paste. Regular tomato and vegetable juices. Fruits Dried fruits. Canned fruit in light or heavy syrup. Fruit juice. Meat and Other Protein Products Fatty cuts of meat. Ribs, chicken wings, bacon, sausage, bologna, salami, chitterlings, fatback, hot dogs, bratwurst, and packaged luncheon meats. Salted nuts and seeds. Canned beans with salt. Dairy Whole or 2% milk, cream, half-and-half, and cream cheese. Whole-fat or sweetened yogurt. Full-fat   cheeses or blue cheese. Nondairy creamers and whipped toppings. Processed cheese, cheese spreads, or cheese  curds. Condiments Onion and garlic salt, seasoned salt, table salt, and sea salt. Canned and packaged gravies. Worcestershire sauce. Tartar sauce. Barbecue sauce. Teriyaki sauce. Soy sauce, including reduced sodium. Steak sauce. Fish sauce. Oyster sauce. Cocktail sauce. Horseradish. Ketchup and mustard. Meat flavorings and tenderizers. Bouillon cubes. Hot sauce. Tabasco sauce. Marinades. Taco seasonings. Relishes. Fats and Oils Butter, stick margarine, lard, shortening, ghee, and bacon fat. Coconut, palm kernel, or palm oils. Regular salad dressings. Other Pickles and olives. Salted popcorn and pretzels. The items listed above may not be a complete list of foods and beverages to avoid. Contact your dietitian for more information. WHERE CAN I FIND MORE INFORMATION? National Heart, Lung, and Blood Institute: travelstabloid.com   This information is not intended to replace advice given to you by your health care provider. Make sure you discuss any questions you have with your health care provider.   Document Released: 03/17/2011 Document Revised: 04/18/2014 Document Reviewed: 01/30/2013 Elsevier Interactive Patient Education 2016 [employer] Carbohydrate Counting for Diabetes Mellitus Carbohydrate counting is a method for keeping track of the amount of carbohydrates you eat. Eating carbohydrates naturally increases the level of sugar (glucose) in your blood, so it is important for you to know the amount that is okay for you to have in every meal. Carbohydrate counting helps keep the level of glucose in your blood within normal limits. The amount of carbohydrates allowed is different for every person. A dietitian can help you calculate the amount that is right for you. Once you know the amount of carbohydrates you can have, you can count the carbohydrates in the foods you want to eat. Carbohydrates are found in the following foods:  Grains, such as breads and  cereals.  Dried beans and soy products.  Starchy vegetables, such as potatoes, peas, and corn.  Fruit and fruit juices.  Milk and yogurt.  Sweets and snack foods, such as cake, cookies, candy, chips, soft drinks, and fruit drinks. CARBOHYDRATE COUNTING There are two ways to count the carbohydrates in your food. You can use either of the methods or a combination of both. Reading the "Nutrition Facts" on [place] The "Nutrition Facts" is an area that is included on the labels of almost all packaged food and beverages in the [place]. It includes the serving size of that food or beverage and information about the nutrients in each serving of the food, including the grams (g) of carbohydrate per serving.  Decide the number of servings of this food or beverage that you will be able to eat or drink. Multiply that number of servings by the number of grams of carbohydrate that is listed on the label for that serving. The total will be the amount of carbohydrates you will be having when you eat or drink this food or beverage. Learning Standard Serving Sizes of Food When you eat food that is not packaged or does not include "Nutrition Facts" on the label, you need to measure the servings in order to count the amount of carbohydrates.A serving of most carbohydrate-rich foods contains about 15 g of carbohydrates. The following list includes serving sizes of carbohydrate-rich foods that provide 15 g ofcarbohydrate per serving:   1 slice of bread (1 oz) or 1 six-inch tortilla.    of a hamburger bun or English muffin.  4-6 crackers.   cup unsweetened dry cereal.    cup hot cereal.  cup rice or pasta.    cup mashed potatoes or  of a large baked potato.  1 cup fresh fruit or one small piece of fruit.    cup canned or frozen fruit or fruit juice.  1 cup milk.   cup plain fat-free yogurt or yogurt sweetened with artificial sweeteners.   cup cooked dried beans or starchy  vegetable, such as peas, corn, or potatoes.  Decide the number of standard-size servings that you will eat. Multiply that number of servings by 15 (the grams of carbohydrates in that serving). For example, if you eat 2 cups of strawberries, you will have eaten 2 servings and 30 g of carbohydrates (2 servings x 15 g = 30 g). For foods such as soups and casseroles, in which more than one food is mixed in, you will need to count the carbohydrates in each food that is included. EXAMPLE OF CARBOHYDRATE COUNTING Sample Dinner  3 oz chicken breast.   cup of brown rice.   cup of corn.  1 cup milk.   1 cup strawberries with sugar-free whipped topping.  Carbohydrate Calculation Step 1: Identify the foods that contain carbohydrates:   Rice.   Corn.   Milk.   Strawberries. Step 2:Calculate the number of servings eaten of each:   2 servings of rice.   1 serving of corn.   1 serving of milk.   1 serving of strawberries. Step 3: Multiply each of those number of servings by 15 g:   2 servings of rice x 15 g = 30 g.   1 serving of corn x 15 g = 15 g.   1 serving of milk x 15 g = 15 g.   1 serving of strawberries x 15 g = 15 g. Step 4: Add together all of the amounts to find the total grams of carbohydrates eaten: 30 g + 15 g + 15 g + 15 g = 75 g.   This information is not intended to replace advice given to you by your health care provider. Make sure you discuss any questions you have with your health care provider.   Document Released: 03/28/2005 Document Revised: 04/18/2014 Document Reviewed: 02/22/2013 Elsevier Interactive Patient Education 2016 [employer]. Hypertension Hypertension, commonly called high blood pressure, is when the force of blood pumping through your arteries is too strong. Your arteries are the blood vessels that carry blood from your heart throughout your body. A blood pressure reading consists of a higher number over a lower number, such as  110/72. The higher number (systolic) is the pressure inside your arteries when your heart pumps. The lower number (diastolic) is the pressure inside your arteries when your heart relaxes. Ideally you want your blood pressure below 120/80. Hypertension forces your heart to work harder to pump blood. Your arteries may become narrow or stiff. Having untreated or uncontrolled hypertension can cause heart attack, stroke, kidney disease, and other problems. RISK FACTORS Some risk factors for high blood pressure are controllable. Others are not.  Risk factors you cannot control include:   Race. You may be at higher risk if you are African American.  Age. Risk increases with age.  Gender. Men are at higher risk than women before age 67 years. After age 60, women are at higher risk than men. Risk factors you can control include:  Not getting enough exercise or physical activity.  Being overweight.  Getting too much fat, sugar, calories, or salt in your diet.  Drinking too much  alcohol. SIGNS AND SYMPTOMS Hypertension does not usually cause signs or symptoms. Extremely high blood pressure (hypertensive crisis) may cause headache, anxiety, shortness of breath, and nosebleed. DIAGNOSIS To check if you have hypertension, your health care provider will measure your blood pressure while you are seated, with your arm held at the level of your heart. It should be measured at least twice using the same arm. Certain conditions can cause a difference in blood pressure between your right and left arms. A blood pressure reading that is higher than normal on one occasion does not mean that you need treatment. If it is not clear whether you have high blood pressure, you may be asked to return on a different day to have your blood pressure checked again. Or, you may be asked to monitor your blood pressure at home for 1 or more weeks. TREATMENT Treating high blood pressure includes making lifestyle changes and  possibly taking medicine. Living a healthy lifestyle can help lower high blood pressure. You may need to change some of your habits. Lifestyle changes may include:  Following the DASH diet. This diet is high in fruits, vegetables, and whole grains. It is low in salt, red meat, and added sugars.  Keep your sodium intake below 2,300 mg per day.  Getting at least 30-45 minutes of aerobic exercise at least 4 times per week.  Losing weight if necessary.  Not smoking.  Limiting alcoholic beverages.  Learning ways to reduce stress. Your health care provider may prescribe medicine if lifestyle changes are not enough to get your blood pressure under control, and if one of the following is true:  You are 14-59 years of age and your systolic blood pressure is above 140.  You are 69 years of age or older, and your systolic blood pressure is above 150.  Your diastolic blood pressure is above 90.  You have diabetes, and your systolic blood pressure is over [id number] or your diastolic blood pressure is over 90.  You have kidney disease and your blood pressure is above 140/90.  You have heart disease and your blood pressure is above 140/90. Your personal target blood pressure may vary depending on your medical conditions, your age, and other factors. HOME CARE INSTRUCTIONS  Have your blood pressure rechecked as directed by your health care provider.   Take medicines only as directed by your health care provider. Follow the directions carefully. Blood pressure medicines must be taken as prescribed. The medicine does not work as well when you skip doses. Skipping doses also puts you at risk for problems.  Do not smoke.   Monitor your blood pressure at home as directed by your health care provider. SEEK MEDICAL CARE IF:   You think you are having a reaction to medicines taken.  You have recurrent headaches or feel dizzy.  You have swelling in your ankles.  You have trouble with your  vision. SEEK IMMEDIATE MEDICAL CARE IF:  You develop a severe headache or confusion.  You have unusual weakness, numbness, or feel faint.  You have severe chest or abdominal pain.  You vomit repeatedly.  You have trouble breathing. MAKE SURE YOU:   Understand these instructions.  Will watch your condition.  Will get help right away if you are not doing well or get worse.   This information is not intended to replace advice given to you by your health care provider. Make sure you discuss any questions you have with your health care provider.   Document  Released: 03/28/2005 Document Revised: 08/12/2014 Document Reviewed: 01/18/2013 Elsevier Interactive Patient Education [employer].

## 2015-08-13 NOTE — Progress Notes (Signed)
Patient is here for Medication Management.  Patient denies pain at this time  Patient has interview with Kendra Griffith to address her depression on 08/14/15.  Patient has not taken medication today. Patient has eaten today.  Patient is requesting refills on Gabapentin, HCTZ, and Metformin

## 2015-08-19 ENCOUNTER — Other Ambulatory Visit: Payer: Self-pay | Admitting: Internal Medicine

## 2015-08-19 MED FILL — FAMOTIDINE 20 MG TABLET: 20 | 30 days supply | Qty: 60 | Fill #0

## 2015-08-21 ENCOUNTER — Ambulatory Visit: Payer: No Typology Code available for payment source | Attending: Internal Medicine

## 2015-12-28 ENCOUNTER — Other Ambulatory Visit: Payer: Self-pay | Admitting: Internal Medicine

## 2015-12-28 DIAGNOSIS — G894 Chronic pain syndrome: Secondary | ICD-10-CM

## 2015-12-28 MED FILL — ?HYDROCHLOROTHIAZIDE 12.5MG: 12.5 | 30 days supply | Qty: 30 | Fill #1

## 2015-12-28 MED FILL — GABAPENTIN 300 MG CAPSULE: 300 | 30 days supply | Qty: 180 | Fill #1

## 2015-12-28 MED FILL — IBUPROFEN 800 MG TABLET: 800 | 10 days supply | Qty: 30 | Fill #0

## 2016-01-21 ENCOUNTER — Ambulatory Visit: Payer: Self-pay | Attending: Internal Medicine | Admitting: Internal Medicine

## 2016-01-21 VITALS — BP 138/90 | HR 80 | Temp 98.4°F | Wt 170.0 lb

## 2016-01-21 DIAGNOSIS — F17200 Nicotine dependence, unspecified, uncomplicated: Secondary | ICD-10-CM

## 2016-01-21 DIAGNOSIS — Z1231 Encounter for screening mammogram for malignant neoplasm of breast: Secondary | ICD-10-CM

## 2016-01-21 DIAGNOSIS — F329 Major depressive disorder, single episode, unspecified: Secondary | ICD-10-CM | POA: Insufficient documentation

## 2016-01-21 DIAGNOSIS — Z1239 Encounter for other screening for malignant neoplasm of breast: Secondary | ICD-10-CM | POA: Insufficient documentation

## 2016-01-21 DIAGNOSIS — Z7984 Long term (current) use of oral hypoglycemic drugs: Secondary | ICD-10-CM | POA: Insufficient documentation

## 2016-01-21 DIAGNOSIS — Z888 Allergy status to other drugs, medicaments and biological substances status: Secondary | ICD-10-CM | POA: Insufficient documentation

## 2016-01-21 DIAGNOSIS — Z79899 Other long term (current) drug therapy: Secondary | ICD-10-CM | POA: Insufficient documentation

## 2016-01-21 DIAGNOSIS — Z885 Allergy status to narcotic agent status: Secondary | ICD-10-CM | POA: Insufficient documentation

## 2016-01-21 DIAGNOSIS — I10 Essential (primary) hypertension: Secondary | ICD-10-CM

## 2016-01-21 DIAGNOSIS — G894 Chronic pain syndrome: Secondary | ICD-10-CM

## 2016-01-21 DIAGNOSIS — E119 Type 2 diabetes mellitus without complications: Secondary | ICD-10-CM

## 2016-01-21 DIAGNOSIS — E1142 Type 2 diabetes mellitus with diabetic polyneuropathy: Secondary | ICD-10-CM

## 2016-01-21 DIAGNOSIS — F419 Anxiety disorder, unspecified: Secondary | ICD-10-CM | POA: Insufficient documentation

## 2016-01-21 DIAGNOSIS — F17210 Nicotine dependence, cigarettes, uncomplicated: Secondary | ICD-10-CM | POA: Insufficient documentation

## 2016-01-21 LAB — CBC WITH DIFFERENTIAL/PLATELET
Basophils Absolute: 62 cells/uL (ref 0–200)
Basophils Relative: 1 %
EOS PCT: 4 %
Eosinophils Absolute: 248 cells/uL (ref 15–500)
HEMATOCRIT: 37.3 % (ref 35.0–45.0)
Hemoglobin: 11.8 g/dL (ref 11.7–15.5)
LYMPHS PCT: 59 %
Lymphs Abs: 3658 cells/uL (ref 850–3900)
MCH: 26.9 pg — ABNORMAL LOW (ref 27.0–33.0)
MCHC: 31.6 g/dL — AB (ref 32.0–36.0)
MCV: 85 fL (ref 80.0–100.0)
MPV: 11 fL (ref 7.5–12.5)
Monocytes Absolute: 496 cells/uL (ref 200–950)
Monocytes Relative: 8 %
NEUTROS PCT: 28 %
Neutro Abs: 1736 cells/uL (ref 1500–7800)
Platelets: 215 10*3/uL (ref 140–400)
RBC: 4.39 MIL/uL (ref 3.80–5.10)
RDW: 14.4 % (ref 11.0–15.0)
WBC: 6.2 10*3/uL (ref 3.8–10.8)

## 2016-01-21 LAB — COMPLETE METABOLIC PANEL WITH GFR
ALT: 9 U/L (ref 6–29)
AST: 14 U/L (ref 10–35)
Albumin: 4.1 g/dL (ref 3.6–5.1)
Alkaline Phosphatase: 64 U/L (ref 33–130)
BUN: 12 mg/dL (ref 7–25)
CALCIUM: 9.5 mg/dL (ref 8.6–10.4)
CHLORIDE: 106 mmol/L (ref 98–110)
CO2: 26 mmol/L (ref 20–31)
Creat: 0.83 mg/dL (ref 0.50–1.05)
GFR, EST NON AFRICAN AMERICAN: 82 mL/min (ref >=60)
GFR, Est African American: >89 mL/min (ref >=60)
Glucose, Bld: 89 mg/dL (ref 65–99)
POTASSIUM: 4.1 mmol/L (ref 3.5–5.3)
SODIUM: 141 mmol/L (ref 135–146)
Total Bilirubin: 0.3 mg/dL (ref 0.2–1.2)
Total Protein: 6.8 g/dL (ref 6.1–8.1)

## 2016-01-21 LAB — LIPID PANEL
CHOL/HDL RATIO: 2.9 ratio (ref <=5.0)
CHOLESTEROL: 150 mg/dL (ref 125–200)
HDL: 52 mg/dL (ref >=46)
LDL Cholesterol: 83 mg/dL (ref <130)
TRIGLYCERIDES: 74 mg/dL (ref <150)
VLDL: 15 mg/dL (ref <30)

## 2016-01-21 LAB — POCT GLYCOSYLATED HEMOGLOBIN (HGB A1C): Hemoglobin A1C: 6.3

## 2016-01-21 LAB — TSH: TSH: 1.64 mIU/L

## 2016-01-21 LAB — GLUCOSE, POCT (MANUAL RESULT ENTRY): POC Glucose: 73 mg/dl (ref 70–99)

## 2016-01-21 MED ORDER — FAMOTIDINE 20 MG PO TABS: 20.0000 mg | ORAL_TABLET | Freq: Every day | ORAL | 3 refills | Status: DC

## 2016-01-21 MED ORDER — AMITRIPTYLINE HCL 50 MG PO TABS: 50.0000 mg | ORAL_TABLET | Freq: Every day | ORAL | 3 refills | Status: DC

## 2016-01-21 MED ORDER — HYDROCHLOROTHIAZIDE 12.5 MG PO TABS: 12.5000 mg | ORAL_TABLET | Freq: Every day | ORAL | 3 refills | Status: DC

## 2016-01-21 MED ORDER — IBUPROFEN 800 MG PO TABS: 800.0000 mg | ORAL_TABLET | Freq: Three times a day (TID) | ORAL | 1 refills | Status: DC | PRN

## 2016-01-21 MED ORDER — GABAPENTIN 300 MG PO CAPS: 600.0000 mg | ORAL_CAPSULE | Freq: Three times a day (TID) | ORAL | 3 refills | Status: DC

## 2016-01-21 MED FILL — ?AMITRIPTYLINE HCL 50MG TAB: 50 | 30 days supply | Qty: 30 | Fill #0

## 2016-01-21 MED FILL — FAMOTIDINE 20 MG TABLET: 20 | 30 days supply | Qty: 30 | Fill #0

## 2016-01-21 MED FILL — HYDROCHLOROTHIAZIDE 12.5 MG: 12.5 | 30 days supply | Qty: 30 | Fill #0

## 2016-01-21 MED FILL — IBUPROFEN 800 MG TABLET: 800 | 20 days supply | Qty: 60 | Fill #0

## 2016-01-21 NOTE — Progress Notes (Signed)
Kendra Griffith, is a 50 y.o. female  [id number]  [id number]  DOB - 1965-07-22  Chief Complaint  Patient presents with  . Follow-up       Subjective:   Kendra Griffith is a 50 y.o. female with history of hypertension, prediabetes, chronic pain syndrome, anxiety and depression  here today for a follow up visit and medication refill. Patient is in good spirit today, has no new complaint. She denies any suicidal ideation or thoughts. Patient still smokes about 1PPD of cigarette. Patient has No headache, No chest pain, No abdominal pain - No Nausea, No new weakness tingling or numbness, No Cough - SOB. Patient is due for mammogram and colonoscopy but wants to differ the colonoscopy till a later date.  Problem  Breast Cancer Screening    ALLERGIES: Allergies  Allergen Reactions  . Darvocet [Propoxyphene N-Acetaminophen]     Makes her jittery  . Hydrocodone-Acetaminophen     REACTION: Upset stomach  . Percocet [Oxycodone-Acetaminophen]     Makes her jittery  . Valium [Diazepam] Other (See Comments)    UNKNOWN Makes pt. Feel "out of wack"    PAST MEDICAL HISTORY: Past Medical History:  Diagnosis Date  . Anemia   . Anxiety   . Depression   . Hypertension   . Pre-diabetes     MEDICATIONS AT HOME: Prior to Admission medications   Medication Sig Start Date End Date Taking? Authorizing Provider  amitriptyline (ELAVIL) 50 MG tablet Take 1 tablet (50 mg total) by mouth at bedtime. 01/21/16  Yes Tresa Garter, MD  Biotin (BIOTIN MAXIMUM STRENGTH) 10 MG TABS Take 5,000 mg by mouth daily.    Yes Historical Provider, MD  famotidine (PEPCID) 20 MG tablet Take 1 tablet (20 mg total) by mouth daily. 01/21/16  Yes Tresa Garter, MD  gabapentin (NEURONTIN) 300 MG capsule Take 2 capsules (600 mg total) by mouth 3 (three) times daily. 01/21/16  Yes Tresa Garter, MD  hydrochlorothiazide (HYDRODIURIL) 12.5 MG tablet Take 1 tablet (12.5 mg total) by mouth daily.  01/21/16  Yes Tresa Garter, MD  ibuprofen (ADVIL,MOTRIN) 800 MG tablet Take 1 tablet (800 mg total) by mouth every 8 (eight) hours as needed. 01/21/16  Yes Tresa Garter, MD  metFORMIN (GLUCOPHAGE) 500 MG tablet Take 1 tablet (500 mg total) by mouth daily with breakfast. 08/13/15  Yes Tresa Garter, MD    Objective:   Vitals:   01/21/16 1628  BP: 138/90  Pulse: 80  Temp: 98.4 F (36.9 C)  TempSrc: Oral  Weight: 170 lb (77.1 kg)   Exam General appearance : Awake, alert, not in any distress. Speech Clear. Not toxic looking HEENT: Atraumatic and Normocephalic, pupils equally reactive to light and accomodation Neck: Supple, no JVD. No cervical lymphadenopathy.  Chest: Good air entry bilaterally, no added sounds  CVS: S1 S2 regular, no murmurs.  Abdomen: Bowel sounds present, Non tender and not distended with no gaurding, rigidity or rebound. Extremities: B/L Lower Ext shows no edema, both legs are warm to touch Neurology: Awake alert, and oriented X 3, CN II-XII intact, Non focal Skin: No Rash  Data Review Lab Results  Component Value Date   HGBA1C 6.3 01/21/2016   HGBA1C 6.1 08/13/2015   HGBA1C 6.10 08/19/2014    Assessment & Plan   1. Type 2 diabetes mellitus with diabetic polyneuropathy, without long-term current use of insulin (HCC)  - POCT A1C is now 6.3% - Glucose (CBG) - Microalbumin/Creatinine Ratio, Urine - gabapentin (  NEURONTIN) 300 MG capsule; Take 2 capsules (600 mg total) by mouth 3 (three) times daily.  Dispense: 180 capsule; Refill: 3  - COMPLETE METABOLIC PANEL WITH GFR - Lipid panel - TSH - Urinalysis, Complete - VITAMIN D 25 Hydroxy (Vit-D Deficiency, Fractures)  2. Essential hypertension, benign  - hydrochlorothiazide (HYDRODIURIL) 12.5 MG tablet; Take 1 tablet (12.5 mg total) by mouth daily.  Dispense: 90 tablet; Refill: 3  - CBC with Differential/Platelet  We have discussed target BP range and blood pressure goal. I have  advised patient to check BP regularly and to call us back or report to clinic if the numbers are consistently higher than 140/90. We discussed the importance of compliance with medical therapy and DASH diet recommended, consequences of uncontrolled hypertension discussed.  - continue current BP medications  3. Chronic pain syndrome  - amitriptyline (ELAVIL) 50 MG tablet; Take 1 tablet (50 mg total) by mouth at bedtime.  Dispense: 30 tablet; Refill: 3 - ibuprofen (ADVIL,MOTRIN) 800 MG tablet; Take 1 tablet (800 mg total) by mouth every 8 (eight) hours as needed.  Dispense: 60 tablet; Refill: 1 - famotidine (PEPCID) 20 MG tablet; Take 1 tablet (20 mg total) by mouth daily.  Dispense: 60 tablet; Refill: 3  4. Smoking  Kendra Griffith was counseled on the dangers of tobacco use, and was advised to quit. Reviewed strategies to maximize success, including removing cigarettes and smoking materials from environment, stress management and support of family/friends.   5. Breast cancer screening  - MM Digital Screening; Future  Patient have been counseled extensively about nutrition and exercise. Other issues discussed during this visit include: low cholesterol diet, weight control and daily exercise, importance of adherence with medications and regular follow-up. We also discussed long term complications of uncontrolled hypertension.   Return in about 6 months (around 07/21/2016) for Hemoglobin A1C and Follow up, DM, Follow up HTN, Follow up Pain and comorbidities, Depression and Anxiety.  The patient was given clear instructions to go to ER or return to medical center if symptoms don't improve, worsen or new problems develop. The patient verbalized understanding. The patient was told to call to get lab results if they haven't heard anything in the next week.   This note has been created with Surveyor, quantity. Any transcriptional errors are unintentional.     Angelica Chessman, MD, [place], [place], [place], [place] and [place], [place]   01/21/2016, 5:21 PM

## 2016-01-21 NOTE — Patient Instructions (Signed)
Smoking Cessation, Tips for Success If you are ready to quit smoking, congratulations! You have chosen to help yourself be healthier. Cigarettes bring nicotine, tar, carbon monoxide, and other irritants into your body. Your lungs, heart, and blood vessels will be able to work better without these poisons. There are many different ways to quit smoking. Nicotine gum, nicotine patches, a nicotine inhaler, or nicotine nasal spray can help with physical craving. Hypnosis, support groups, and medicines help break the habit of smoking. WHAT THINGS CAN I DO TO MAKE QUITTING EASIER?  Here are some tips to help you quit for good:  Pick a date when you will quit smoking completely. Tell all of your friends and family about your plan to quit on that date.  Do not try to slowly cut down on the number of cigarettes you are smoking. Pick a quit date and quit smoking completely starting on that day.  Throw away all cigarettes.   Clean and remove all ashtrays from your home, work, and car.  On a card, write down your reasons for quitting. Carry the card with you and read it when you get the urge to smoke.  Cleanse your body of nicotine. Drink enough water and fluids to keep your urine clear or pale yellow. Do this after quitting to flush the nicotine from your body.  Learn to predict your moods. Do not let a bad situation be your excuse to have a cigarette. Some situations in your life might tempt you into wanting a cigarette.  Never have "just one" cigarette. It leads to wanting another and another. Remind yourself of your decision to quit.  Change habits associated with smoking. If you smoked while driving or when feeling stressed, try other activities to replace smoking. Stand up when drinking your coffee. Brush your teeth after eating. Sit in a different chair when you read the paper. Avoid alcohol while trying to quit, and try to drink fewer caffeinated beverages. Alcohol and caffeine may urge you to  smoke.  Avoid foods and drinks that can trigger a desire to smoke, such as sugary or spicy foods and alcohol.  Ask people who smoke not to smoke around you.  Have something planned to do right after eating or having a cup of coffee. For example, plan to take a walk or exercise.  Try a relaxation exercise to calm you down and decrease your stress. Remember, you may be tense and nervous for the first 2 weeks after you quit, but this will pass.  Find new activities to keep your hands busy. Play with a pen, coin, or rubber band. Doodle or draw things on paper.  Brush your teeth right after eating. This will help cut down on the craving for the taste of tobacco after meals. You can also try mouthwash.   Use oral substitutes in place of cigarettes. Try using lemon drops, carrots, cinnamon sticks, or chewing gum. Keep them handy so they are available when you have the urge to smoke.  When you have the urge to smoke, try deep breathing.  Designate your home as a nonsmoking area.  If you are a heavy smoker, ask your health care provider about a prescription for nicotine chewing gum. It can ease your withdrawal from nicotine.  Reward yourself. Set aside the cigarette money you save and buy yourself something nice.  Look for support from others. Join a support group or smoking cessation program. Ask someone at home or at work to help you with your plan   to quit smoking.  Always ask yourself, "Do I need this cigarette or is this just a reflex?" Tell yourself, "Today, I choose not to smoke," or "I do not want to smoke." You are reminding yourself of your decision to quit.  Do not replace cigarette smoking with electronic cigarettes (commonly called e-cigarettes). The safety of e-cigarettes is unknown, and some may contain harmful chemicals.  If you relapse, do not give up! Plan ahead and think about what you will do the next time you get the urge to smoke. HOW WILL I FEEL WHEN I QUIT SMOKING? You  may have symptoms of withdrawal because your body is used to nicotine (the addictive substance in cigarettes). You may crave cigarettes, be irritable, feel very hungry, cough often, get headaches, or have difficulty concentrating. The withdrawal symptoms are only temporary. They are strongest when you first quit but will go away within 10-14 days. When withdrawal symptoms occur, stay in control. Think about your reasons for quitting. Remind yourself that these are signs that your body is healing and getting used to being without cigarettes. Remember that withdrawal symptoms are easier to treat than the major diseases that smoking can cause.  Even after the withdrawal is over, expect periodic urges to smoke. However, these cravings are generally short lived and will go away whether you smoke or not. Do not smoke! WHAT RESOURCES ARE AVAILABLE TO HELP ME QUIT SMOKING? Your health care provider can direct you to community resources or hospitals for support, which may include:  Group support.  Education.  Hypnosis.  Therapy.   This information is not intended to replace advice given to you by your health care provider. Make sure you discuss any questions you have with your health care provider.   Document Released: 12/25/2003 Document Revised: 04/18/2014 Document Reviewed: 09/13/2012 Elsevier Interactive Patient Education 2016 [place]. Diabetes and Foot Care Diabetes may cause you to have problems because of poor blood supply (circulation) to your feet and legs. This may cause the skin on your feet to become thinner, break easier, and heal more slowly. Your skin may become dry, and the skin may peel and crack. You may also have nerve damage in your legs and feet causing decreased feeling in them. You may not notice minor injuries to your feet that could lead to infections or more serious problems. Taking care of your feet is one of the most important things you can do for yourself.  HOME CARE  INSTRUCTIONS  Wear shoes at all times, even in the house. Do not go barefoot. Bare feet are easily injured.  Check your feet daily for blisters, cuts, and redness. If you cannot see the bottom of your feet, use a mirror or ask someone for help.  Wash your feet with warm water (do not use hot water) and mild soap. Then pat your feet and the areas between your toes until they are completely dry. Do not soak your feet as this can dry your skin.  Apply a moisturizing lotion or petroleum jelly (that does not contain alcohol and is unscented) to the skin on your feet and to dry, brittle toenails. Do not apply lotion between your toes.  Trim your toenails straight across. Do not dig under them or around the cuticle. File the edges of your nails with an emery board or nail file.  Do not cut corns or calluses or try to remove them with medicine.  Wear clean socks or stockings every day. Make sure they are  not too tight. Do not wear knee-high stockings since they may decrease blood flow to your legs.  Wear shoes that fit properly and have enough cushioning. To break in new shoes, wear them for just a few hours a day. This prevents you from injuring your feet. Always look in your shoes before you put them on to be sure there are no objects inside.  Do not cross your legs. This may decrease the blood flow to your feet.  If you find a minor scrape, cut, or break in the skin on your feet, keep it and the skin around it clean and dry. These areas may be cleansed with mild soap and water. Do not cleanse the area with peroxide, alcohol, or iodine.  When you remove an adhesive bandage, be sure not to damage the skin around it.  If you have a wound, look at it several times a day to make sure it is healing.  Do not use heating pads or hot water bottles. They may burn your skin. If you have lost feeling in your feet or legs, you may not know it is happening until it is too late.  Make sure your health care  provider performs a complete foot exam at least annually or more often if you have foot problems. Report any cuts, sores, or bruises to your health care provider immediately. SEEK MEDICAL CARE IF:   You have an injury that is not healing.  You have cuts or breaks in the skin.  You have an ingrown nail.  You notice redness on your legs or feet.  You feel burning or tingling in your legs or feet.  You have pain or cramps in your legs and feet.  Your legs or feet are numb.  Your feet always feel cold. SEEK IMMEDIATE MEDICAL CARE IF:   There is increasing redness, swelling, or pain in or around a wound.  There is a red line that goes up your leg.  Pus is coming from a wound.  You develop a fever or as directed by your health care provider.  You notice a bad smell coming from an ulcer or wound.   This information is not intended to replace advice given to you by your health care provider. Make sure you discuss any questions you have with your health care provider.   Document Released: 03/25/2000 Document Revised: 11/28/2012 Document Reviewed: 09/04/2012 Elsevier Interactive Patient Education 2016 [place] Carbohydrate Counting for Diabetes Mellitus Carbohydrate counting is a method for keeping track of the amount of carbohydrates you eat. Eating carbohydrates naturally increases the level of sugar (glucose) in your blood, so it is important for you to know the amount that is okay for you to have in every meal. Carbohydrate counting helps keep the level of glucose in your blood within normal limits. The amount of carbohydrates allowed is different for every person. A dietitian can help you calculate the amount that is right for you. Once you know the amount of carbohydrates you can have, you can count the carbohydrates in the foods you want to eat. Carbohydrates are found in the following foods:  Grains, such as breads and cereals.  Dried beans and soy  products.  Starchy vegetables, such as potatoes, peas, and corn.  Fruit and fruit juices.  Milk and yogurt.  Sweets and snack foods, such as cake, cookies, candy, chips, soft drinks, and fruit drinks. CARBOHYDRATE COUNTING There are two ways to count the carbohydrates in your food. You  can use either of the methods or a combination of both. Reading the "Nutrition Facts" on [place] The "Nutrition Facts" is an area that is included on the labels of almost all packaged food and beverages in the [place]. It includes the serving size of that food or beverage and information about the nutrients in each serving of the food, including the grams (g) of carbohydrate per serving.  Decide the number of servings of this food or beverage that you will be able to eat or drink. Multiply that number of servings by the number of grams of carbohydrate that is listed on the label for that serving. The total will be the amount of carbohydrates you will be having when you eat or drink this food or beverage. Learning Standard Serving Sizes of Food When you eat food that is not packaged or does not include "Nutrition Facts" on the label, you need to measure the servings in order to count the amount of carbohydrates.A serving of most carbohydrate-rich foods contains about 15 g of carbohydrates. The following list includes serving sizes of carbohydrate-rich foods that provide 15 g ofcarbohydrate per serving:   1 slice of bread (1 oz) or 1 six-inch tortilla.    of a hamburger bun or English muffin.  4-6 crackers.   cup unsweetened dry cereal.    cup hot cereal.   cup rice or pasta.    cup mashed potatoes or  of a large baked potato.  1 cup fresh fruit or one small piece of fruit.    cup canned or frozen fruit or fruit juice.  1 cup milk.   cup plain fat-free yogurt or yogurt sweetened with artificial sweeteners.   cup cooked dried beans or starchy vegetable, such as peas, corn,  or potatoes.  Decide the number of standard-size servings that you will eat. Multiply that number of servings by 15 (the grams of carbohydrates in that serving). For example, if you eat 2 cups of strawberries, you will have eaten 2 servings and 30 g of carbohydrates (2 servings x 15 g = 30 g). For foods such as soups and casseroles, in which more than one food is mixed in, you will need to count the carbohydrates in each food that is included. EXAMPLE OF CARBOHYDRATE COUNTING Sample Dinner  3 oz chicken breast.   cup of brown rice.   cup of corn.  1 cup milk.   1 cup strawberries with sugar-free whipped topping.  Carbohydrate Calculation Step 1: Identify the foods that contain carbohydrates:   Rice.   Corn.   Milk.   Strawberries. Step 2:Calculate the number of servings eaten of each:   2 servings of rice.   1 serving of corn.   1 serving of milk.   1 serving of strawberries. Step 3: Multiply each of those number of servings by 15 g:   2 servings of rice x 15 g = 30 g.   1 serving of corn x 15 g = 15 g.   1 serving of milk x 15 g = 15 g.   1 serving of strawberries x 15 g = 15 g. Step 4: Add together all of the amounts to find the total grams of carbohydrates eaten: 30 g + 15 g + 15 g + 15 g = 75 g.   This information is not intended to replace advice given to you by your health care provider. Make sure you discuss any questions you have with your health care provider.  Document Released: 03/28/2005 Document Revised: 04/18/2014 Document Reviewed: 02/22/2013 Elsevier Interactive Patient Education [employer].

## 2016-01-21 NOTE — Progress Notes (Signed)
Patient is here for FU  Patient denies pain at this time.  Patient has not taken medication today. Patient has eaten today.

## 2016-01-22 LAB — VITAMIN D 25 HYDROXY (VIT D DEFICIENCY, FRACTURES): VIT D 25 HYDROXY: 30 ng/mL (ref 30–100)

## 2016-01-26 ENCOUNTER — Telehealth: Payer: Self-pay | Admitting: *Deleted

## 2016-01-26 NOTE — Telephone Encounter (Signed)
-----   Message from Tresa Garter, MD sent at 01/24/2016  3:14 PM EDT ----- Please inform patient that lab results are normal

## 2016-01-26 NOTE — Telephone Encounter (Signed)
Patient is aware of lab results being normal. MA left a VM informing patient of these results with the authorization of a detailed message being on file. Patient provided [facility] contact number for any questions or concerns.

## 2016-01-27 ENCOUNTER — Telehealth: Payer: Self-pay | Admitting: Internal Medicine

## 2016-01-27 ENCOUNTER — Ambulatory Visit: Payer: Self-pay | Attending: Internal Medicine

## 2016-01-27 DIAGNOSIS — K037 Posteruptive color changes of dental hard tissues: Secondary | ICD-10-CM | POA: Insufficient documentation

## 2016-01-27 NOTE — Telephone Encounter (Signed)
Patient came in to office requesting referral to Adult Dental. Patient has [facility] card. Please f/u

## 2016-01-27 NOTE — Patient Instructions (Signed)
Patient is aware of receiving FU call with results.

## 2016-01-27 NOTE — Progress Notes (Addendum)
Patient provided urine sample with no concerns.  Patient denied any suicidal ideations at this time.

## 2016-01-27 NOTE — Telephone Encounter (Signed)
Patient has appointment already scheduled for tomorrow

## 2016-01-28 LAB — MICROALBUMIN / CREATININE URINE RATIO
CREATININE, URINE: 166 mg/dL (ref 20–320)
Microalb Creat Ratio: 11 mcg/mg creat (ref <30)
Microalb, Ur: 1.8 mg/dL

## 2016-01-29 LAB — URINALYSIS, COMPLETE
Bacteria, UA: NONE SEEN [HPF]
Bilirubin Urine: NEGATIVE
CASTS: NONE SEEN [LPF]
CRYSTALS: NONE SEEN [HPF]
Glucose, UA: NEGATIVE
Ketones, ur: NEGATIVE
Leukocytes, UA: NEGATIVE
NITRITE: NEGATIVE
PH: 6.5 (ref 5.0–8.0)
Protein, ur: NEGATIVE
SPECIFIC GRAVITY, URINE: 1.017 (ref 1.001–1.035)
WBC, UA: NONE SEEN WBC/HPF (ref <=5)
Yeast: NONE SEEN [HPF]

## 2016-02-03 ENCOUNTER — Telehealth: Payer: Self-pay | Admitting: *Deleted

## 2016-02-03 NOTE — Telephone Encounter (Signed)
-----   Message from Tresa Garter, MD sent at 02/01/2016  6:18 PM EDT ----- Normal urine analysis

## 2016-02-03 NOTE — Telephone Encounter (Signed)
MA informed patient via voicemail of urinalysis being normal. No further concerns at this time. Patient provided [facility] telephone number for any questions.

## 2016-02-12 ENCOUNTER — Other Ambulatory Visit: Payer: Self-pay | Admitting: Internal Medicine

## 2016-02-12 MED ORDER — DICLOFENAC SODIUM 75 MG PO TBEC: 75.0000 mg | DELAYED_RELEASE_TABLET | Freq: Two times a day (BID) | ORAL | 0 refills | Status: DC

## 2016-02-12 MED FILL — ?DICLOFENAC SOD DR 75 MG TA: 75 | 15 days supply | Qty: 30 | Fill #0

## 2016-02-23 MED FILL — HYDROCHLOROTHIAZIDE 12.5 MG: 12.5 | 30 days supply | Qty: 30 | Fill #2

## 2016-02-23 MED FILL — IBUPROFEN 800 MG TABLET: 800 | 20 days supply | Qty: 60 | Fill #1

## 2016-03-04 ENCOUNTER — Emergency Department (HOSPITAL_COMMUNITY)
Admission: EM | Admit: 2016-03-04 | Discharge: 2016-03-04 | Disposition: A | Discharge status: Home / Self Care | Payer: Self-pay | Attending: Emergency Medicine | Admitting: Emergency Medicine

## 2016-03-04 ENCOUNTER — Encounter (HOSPITAL_COMMUNITY): Payer: Self-pay | Admitting: Emergency Medicine

## 2016-03-04 DIAGNOSIS — F17210 Nicotine dependence, cigarettes, uncomplicated: Secondary | ICD-10-CM | POA: Insufficient documentation

## 2016-03-04 DIAGNOSIS — Z7984 Long term (current) use of oral hypoglycemic drugs: Secondary | ICD-10-CM | POA: Insufficient documentation

## 2016-03-04 DIAGNOSIS — I10 Essential (primary) hypertension: Secondary | ICD-10-CM | POA: Insufficient documentation

## 2016-03-04 DIAGNOSIS — E1140 Type 2 diabetes mellitus with diabetic neuropathy, unspecified: Secondary | ICD-10-CM | POA: Insufficient documentation

## 2016-03-04 DIAGNOSIS — R200 Anesthesia of skin: Secondary | ICD-10-CM | POA: Insufficient documentation

## 2016-03-04 DIAGNOSIS — Z79899 Other long term (current) drug therapy: Secondary | ICD-10-CM | POA: Insufficient documentation

## 2016-03-04 MED ORDER — DEXAMETHASONE SODIUM PHOSPHATE 10 MG/ML IJ SOLN
10.0000 mg | Freq: Once | INTRAMUSCULAR | Status: AC
  Administered 2016-03-04: 10 mg via INTRAVENOUS
  Filled 2016-03-04: qty 1

## 2016-03-04 MED ORDER — METOCLOPRAMIDE HCL 5 MG/ML IJ SOLN
10.0000 mg | Freq: Once | INTRAMUSCULAR | Status: AC
  Administered 2016-03-04: 10 mg via INTRAVENOUS
  Filled 2016-03-04: qty 2

## 2016-03-04 MED ORDER — DIPHENHYDRAMINE HCL 50 MG/ML IJ SOLN
25.0000 mg | Freq: Once | INTRAMUSCULAR | Status: AC
  Administered 2016-03-04: 25 mg via INTRAVENOUS
  Filled 2016-03-04: qty 1

## 2016-03-04 MED ORDER — KETOROLAC TROMETHAMINE 30 MG/ML IJ SOLN
30.0000 mg | Freq: Once | INTRAMUSCULAR | Status: AC
  Administered 2016-03-04: 30 mg via INTRAVENOUS
  Filled 2016-03-04: qty 1

## 2016-03-04 NOTE — ED Notes (Signed)
Patient verbalized understanding of discharge instructions and denies any further needs or questions at this time. VS stable. Patient ambulatory with steady gait.  

## 2016-03-04 NOTE — ED Triage Notes (Signed)
Pt. Stated, Im usually weak on the let side and I feel like im clumsy all the time.  I hurt all over. I also have a bad tooth that's probably abscess.

## 2016-03-04 NOTE — ED Notes (Signed)
Report received from Nikki K RN; care of patient assumed at this time 

## 2016-03-04 NOTE — ED Provider Notes (Signed)
[facility] DEPT Provider Note   CSN: [id number] Arrival date & time: 03/04/16  1259  History   Chief Complaint Chief Complaint  Patient presents with  . Weakness  . Dental Problem  . Generalized Body Aches   HPI Kendra Griffith is a 50 y.o. female.  HPI Presenting with 5 day history of left side numbness that has been constant. Note pain is along axillary line and denies numbness in upper and lower extremities. Notes generalized weakness, but not localized over either side. Also reports numbness of left first digits of both foot and hand occasionally. Reports dizziness, which she describes as lightheadedness occasionally, worse when turning her head. Notes history of anxiety as well as hypertension and diabetes. Reports history of migraines with left sided headache. Also with left lower tooth abscess--seen by PCP and referred to Dentist. Smoker.  Past Medical History:  Diagnosis Date  . Anemia   . Anxiety   . Depression   . Hypertension   . Pre-diabetes     Patient Active Problem List   Diagnosis Date Noted  . Breast cancer screening 01/21/2016  . Smoking 09/25/2014  . Chronic pain syndrome 09/25/2014  . Callus of foot 08/19/2014  . Type 2 diabetes mellitus without complication ([place]) [id number]  . Depression 06/30/2014  . Peripheral neuropathy ([place]) 05/09/2013  . Poor dentition 02/01/2013  . Pain in joint, ankle and foot 02/01/2013  . Essential hypertension, benign 02/01/2013  . FOOT PAIN, BILATERAL 06/16/2010  . RECTAL BLEEDING 12/08/2009  . HEMATURIA UNSPECIFIED 12/08/2009  . DEPRESSION 08/20/2009  . ABSCESS, TOOTH 08/20/2009  . HYPERLIPIDEMIA 07/30/2009  . SICKLE-CELL TRAIT 07/30/2009  . ANEMIA-NOS 07/30/2009  . ALCOHOLISM 07/30/2009  . HYPERTENSION 07/30/2009  . GERD 07/30/2009  . CALLUS, FOOT 07/30/2009    Past Surgical History:  Procedure Laterality Date  . CESAREAN SECTION    . cyst removal 1997    . FACIAL RECONSTRUCTION SURGERY    . FOOT  SURGERY Bilateral     OB History    Gravida Para Term Preterm AB Living   4         3   SAB TAB Ectopic Multiple Live Births                  Home Medications    Prior to Admission medications   Medication Sig Start Date End Date Taking? Authorizing Provider  amitriptyline (ELAVIL) 50 MG tablet Take 1 tablet (50 mg total) by mouth at bedtime. 01/21/16   Tresa Garter, MD  Biotin (BIOTIN MAXIMUM STRENGTH) 10 MG TABS Take 5,000 mg by mouth daily.     Historical Provider, MD  diclofenac (VOLTAREN) 75 MG EC tablet Take 1 tablet (75 mg total) by mouth 2 (two) times daily. 02/12/16   Tresa Garter, MD  famotidine (PEPCID) 20 MG tablet Take 1 tablet (20 mg total) by mouth daily. 01/21/16   Tresa Garter, MD  gabapentin (NEURONTIN) 300 MG capsule Take 2 capsules (600 mg total) by mouth 3 (three) times daily. 01/21/16   Tresa Garter, MD  hydrochlorothiazide (HYDRODIURIL) 12.5 MG tablet Take 1 tablet (12.5 mg total) by mouth daily. 01/21/16   Tresa Garter, MD  ibuprofen (ADVIL,MOTRIN) 800 MG tablet Take 1 tablet (800 mg total) by mouth every 8 (eight) hours as needed. 01/21/16   Tresa Garter, MD  metFORMIN (GLUCOPHAGE) 500 MG tablet Take 1 tablet (500 mg total) by mouth daily with breakfast. 08/13/15   Tresa Garter, MD  Family History Family History  Problem Relation Age of Onset  . Heart disease Mother   . Asthma Sister   . COPD Sister     Social History Social History  Substance Use Topics  . Smoking status: Current Every Day Smoker    Packs/day: 1.00    Years: 29.00    Types: Cigarettes  . Smokeless tobacco: Never Used     Comment: smokes 1-2 ppd  . Alcohol use No     Comment: Quit drinking in 2012.     Allergies   Darvocet [propoxyphene n-acetaminophen]; Hydrocodone-acetaminophen; Percocet [oxycodone-acetaminophen]; and Valium [diazepam]   Review of Systems Review of Systems  Constitutional: Negative for fever.  HENT:  Positive for dental problem.   Respiratory: Negative for shortness of breath.   Cardiovascular: Negative for chest pain.  Gastrointestinal: Negative for abdominal pain, diarrhea, nausea and vomiting.  Neurological: Positive for dizziness and numbness. Negative for weakness.     Physical Exam Updated Vital Signs BP 133/92   Pulse 68   Temp 98.6 F (37 C) (Oral)   Resp 20   Ht 5\' 5"  (1.651 m)   Wt 75.3 kg   LMP 02/19/2016   SpO2 100%   BMI 27.62 kg/m   Physical Exam  Constitutional: She is oriented to person, place, and time. She appears well-developed and well-nourished. No distress.  HENT:  Head: Normocephalic and atraumatic.  Poor dentition  Cardiovascular: Normal rate and regular rhythm.   No murmur heard. Pulmonary/Chest: Effort normal. No respiratory distress. She has no wheezes.  Abdominal: Soft. She exhibits no distension. There is no tenderness.  Musculoskeletal: She exhibits no edema.  Muscle strength 5/5 in upper and lower extremities  Neurological: She is alert and oriented to person, place, and time. No cranial nerve deficit.  Reports numbness over left side not in dermatomal distrubution. No numbness over upper or lower extremities.  Psychiatric: She has a normal mood and affect. Her behavior is normal.    ED Treatments / Results  Labs (all labs ordered are listed, but only abnormal results are displayed) Labs Reviewed - No data to display  EKG  EKG Interpretation None      Radiology No results found.  Procedures Procedures (including critical care time)  Medications Ordered in ED Medications  dexamethasone (DECADRON) injection 10 mg (10 mg Intravenous Given 03/04/16 1918)  diphenhydrAMINE (BENADRYL) injection 25 mg (25 mg Intravenous Given 03/04/16 1918)  metoCLOPramide (REGLAN) injection 10 mg (10 mg Intravenous Given 03/04/16 1918)  ketorolac (TORADOL) 30 MG/ML injection 30 mg (30 mg Intravenous Given 03/04/16 1918)     Initial Impression  / Assessment and Plan / ED Course  I have reviewed the triage vital signs and the nursing notes.  Pertinent labs & imaging results that were available during my care of the patient were reviewed by me and considered in my medical decision making (see chart for details).  Clinical Course   - Exam not in neurological or dermatomal distribution and not consistent with CVA/TIA or spinal etiology. Suspect migraine and anxiety contributing. - Migraine cocktail ([place], Benadryl, Toradol, Decadron) given  Final Clinical Impressions(s) / ED Diagnoses   Final diagnoses:  Numbness  Not consistent with CVA/TIA or with spinal etiologies. Suspect components of migraine and/or anxiety. Neurological exam normal except for area of numbness over left side that does not follow dermatomes. Follow up with PCP. Consider checking Vitamin B12 as outpatient. Return precautions discussed.  New Prescriptions New Prescriptions   No medications on file  [place], [place] 03/04/16 1950    Leo Grosser, MD 03/05/16 [phone]

## 2016-03-04 NOTE — Discharge Instructions (Signed)
Your numbness does not follow the distribution we would expect from a brain or spine problem. I suspect your migraines and/or anxiety is contributing. I would recommend following up with your primary physician and having them check your vitamin B12 level. Please let us know if the numbness worsens or you develop weakness or numbness in one of your extremities.

## 2016-03-15 ENCOUNTER — Other Ambulatory Visit: Payer: Self-pay | Admitting: Internal Medicine

## 2016-03-15 ENCOUNTER — Telehealth: Payer: Self-pay | Admitting: Internal Medicine

## 2016-03-15 DIAGNOSIS — G894 Chronic pain syndrome: Secondary | ICD-10-CM

## 2016-03-15 MED ORDER — IBUPROFEN 800 MG PO TABS: 800.0000 mg | ORAL_TABLET | Freq: Three times a day (TID) | ORAL | 1 refills | Status: DC | PRN

## 2016-03-15 MED FILL — IBUPROFEN 800 MG TABLET: 800 | 20 days supply | Qty: 60 | Fill #0

## 2016-03-15 NOTE — Telephone Encounter (Signed)
Patient is needing a refill for ibuprofen

## 2016-03-15 NOTE — Telephone Encounter (Signed)
Refilled. Pick up from the pharmacy

## 2016-03-18 ENCOUNTER — Ambulatory Visit: Payer: Self-pay | Attending: Internal Medicine

## 2016-03-31 ENCOUNTER — Ambulatory Visit: Payer: Self-pay | Admitting: Internal Medicine

## 2016-04-07 ENCOUNTER — Ambulatory Visit: Payer: Self-pay | Admitting: Internal Medicine

## 2016-04-07 ENCOUNTER — Telehealth: Payer: Self-pay | Admitting: Internal Medicine

## 2016-04-07 NOTE — Telephone Encounter (Signed)
Patient has [employer] and the [facility] referral has been placed. Patients appointment will be after the new year being that it is for routine and not Emergency.

## 2016-04-07 NOTE — Telephone Encounter (Signed)
Patient would like to know the status of dental referral

## 2016-04-19 ENCOUNTER — Telehealth: Payer: Self-pay | Admitting: Internal Medicine

## 2016-04-19 DIAGNOSIS — G894 Chronic pain syndrome: Secondary | ICD-10-CM

## 2016-04-19 MED ORDER — IBUPROFEN 800 MG PO TABS: 800.0000 mg | ORAL_TABLET | Freq: Three times a day (TID) | ORAL | 0 refills | Status: DC | PRN

## 2016-04-19 MED FILL — IBUPROFEN 800 MG TABLET: 800 | 20 days supply | Qty: 60 | Fill #0

## 2016-04-19 NOTE — Telephone Encounter (Signed)
Patient call requesting refill for Ibuprofen. Please follow up.

## 2016-04-19 NOTE — Telephone Encounter (Signed)
Ibuprofen refilled.

## 2016-04-27 ENCOUNTER — Ambulatory Visit: Payer: Self-pay | Admitting: Internal Medicine

## 2016-05-04 ENCOUNTER — Encounter: Payer: Self-pay | Admitting: Internal Medicine

## 2016-05-04 ENCOUNTER — Encounter: Payer: Self-pay | Admitting: Licensed Clinical Social Worker

## 2016-05-04 ENCOUNTER — Ambulatory Visit: Payer: Self-pay | Attending: Internal Medicine | Admitting: Internal Medicine

## 2016-05-04 VITALS — BP 137/82 | HR 100 | Temp 98.2°F | Resp 18 | Ht 65.0 in | Wt 176.8 lb

## 2016-05-04 DIAGNOSIS — Z79899 Other long term (current) drug therapy: Secondary | ICD-10-CM | POA: Insufficient documentation

## 2016-05-04 DIAGNOSIS — F419 Anxiety disorder, unspecified: Secondary | ICD-10-CM | POA: Insufficient documentation

## 2016-05-04 DIAGNOSIS — I10 Essential (primary) hypertension: Secondary | ICD-10-CM | POA: Insufficient documentation

## 2016-05-04 DIAGNOSIS — Z7984 Long term (current) use of oral hypoglycemic drugs: Secondary | ICD-10-CM | POA: Insufficient documentation

## 2016-05-04 DIAGNOSIS — F17200 Nicotine dependence, unspecified, uncomplicated: Secondary | ICD-10-CM

## 2016-05-04 DIAGNOSIS — Z885 Allergy status to narcotic agent status: Secondary | ICD-10-CM | POA: Insufficient documentation

## 2016-05-04 DIAGNOSIS — G894 Chronic pain syndrome: Secondary | ICD-10-CM | POA: Insufficient documentation

## 2016-05-04 DIAGNOSIS — Z888 Allergy status to other drugs, medicaments and biological substances status: Secondary | ICD-10-CM | POA: Insufficient documentation

## 2016-05-04 DIAGNOSIS — F331 Major depressive disorder, recurrent, moderate: Secondary | ICD-10-CM | POA: Insufficient documentation

## 2016-05-04 DIAGNOSIS — E119 Type 2 diabetes mellitus without complications: Secondary | ICD-10-CM | POA: Insufficient documentation

## 2016-05-04 MED ORDER — BUPROPION HCL ER (XL) 150 MG PO TB24: 150.0000 mg | ORAL_TABLET | Freq: Every day | ORAL | 3 refills | Status: DC

## 2016-05-04 MED ORDER — IBUPROFEN 800 MG PO TABS: 800.0000 mg | ORAL_TABLET | Freq: Three times a day (TID) | ORAL | 0 refills | Status: DC | PRN

## 2016-05-04 MED ORDER — HYDROXYZINE HCL 25 MG PO TABS: 25.0000 mg | ORAL_TABLET | Freq: Three times a day (TID) | ORAL | 3 refills | Status: DC | PRN

## 2016-05-04 NOTE — BH Specialist Note (Signed)
Session Start time: 3:50 PM   End Time: 4:20 PM Total Time:  30 minutes Type of Service: [place]: No.   Interpreter Name & Language: N/A # [facility] Visits July 2017-June 2018: 1st   SUBJECTIVE: Kendra Griffith is a 51 y.o. female  Pt. was referred by Dr. Doreene Burke for:  anxiety and depression. Pt. reports the following symptoms/concerns: overwhelming feelings of sadness and worry, changes in appetite, difficulty sleeping, racing thoughts, withdrawn behavior, and difficulty focusing Duration of problem:  Ongoing Severity: severe Previous treatment: Pt participated in psychotherapy and medication management in past. Pt is interested in re-initiating services.   OBJECTIVE: Mood: Anxious & Affect: Depressed Risk of harm to self or others: Pt has hx of suicidal ideations. Pt denies plan or intent to self-harm and/or harm others. Safety plan was discussed and resources provided Assessments administered: PHQ-9; GAD-7  LIFE CONTEXT:  Family & Social: Pt resides alone. She has four sisters who reside locally and provide limited support. Pt has a close friend from school School/ Work: Pt is currently in school to obtain her GED. She receives Section 8 and Food Stamps [phone]) Self-Care: Pt has difficulty sleeping and changes in appetite. She smokes cigerates Life changes: Pt's anxiety has increased due to a Counsellor.  What is important to pt/family (values): Family, Spirituality, Independence   GOALS ADDRESSED:  Decrease symptoms of depression Decrease symptoms of anxiety  INTERVENTIONS: Solution Focused, Strength-based and Supportive   ASSESSMENT:  Pt currently experiencing depression and anxiety triggered by ongoing conflict with an instructor for GED classes. Pt reports overwhelming feelings of sadness and worry, changes in appetite, difficulty sleeping, racing thoughts, withdrawn behavior, and difficulty focusing. Pt reported SI with no  plan. Pt may benefit from psychotherapy and medication management. LCSWA inquired protective factors with pt. Pt reported that she did not want to harm herself. She disclosed ongoing conflict with GED instructor which has been negatively impacting physical and mental health. LCSWA discussed benefits of applying healthy coping skills to decrease symptoms. Pt identified healthy strategies to resolve conflict and apply during increased stress in and out of class. Pt verbalized interest in re-initiating behavioral health services with [facility]. LCSWA provided pt with community resources for crisis intervention, therapy, and medication management.      PLAN: 1. F/U with behavioral health clinician: Pt was encouraged to contact LCSWA if symptoms worsen or fail to improve to schedule behavioral appointments at [facility]. 2. Behavioral Health meds: Wellbutrin and Vistaril 3. Behavioral recommendations: LCSWA recommends that pt apply healthy coping skills discussed and re-initiate behavioral health services with [facility]. Pt is encouraged to schedule follow up appointment with LCSWA 4. Referral: Brief Counseling/Psychotherapy, [employer], Problem-solving teaching/coping strategies, Psychoeducation and Supportive Counseling 5. From scale of 1-10, how likely are you to follow plan: [place], MSW, [place] Worker 05/06/16 3:14 PM  Warmhandoff:   Warm Hand Off Completed.

## 2016-05-04 NOTE — Progress Notes (Signed)
Kendra Griffith, is a 51 y.o. female  [id number]  [id number]  DOB - 1965-10-04  Chief Complaint  Patient presents with  . Diabetes      Subjective:   Kendra Griffith is a 51 y.o. female with history of hypertension, prediabetes, chronic pain syndrome, anxiety and depression here today for a follow up visit. Patient reports overwhelming feelings of sadness and worry, changes in appetite, difficulty sleeping, racing thoughts, withdrawn behavior, and difficulty focusing. Patient reports suicidal thoughts but no plans. Patient reported that she does not want to harm herself. She disclosed ongoing conflict with GED instructor which has been negatively impacting physical and mental health. Patient has No headache, No chest pain, No abdominal pain - No Nausea, No new weakness tingling or numbness, No Cough - SOB.  Problem  Moderate Episode of Recurrent Major Depressive Disorder (Hcc)   ALLERGIES: Allergies  Allergen Reactions  . Darvocet [Propoxyphene N-Acetaminophen]     Makes her jittery  . Hydrocodone-Acetaminophen     REACTION: Upset stomach  . Percocet [Oxycodone-Acetaminophen]     Makes her jittery  . Valium [Diazepam] Other (See Comments)    UNKNOWN Makes pt. Feel "out of wack"    PAST MEDICAL HISTORY: Past Medical History:  Diagnosis Date  . Anemia   . Anxiety   . Depression   . Hypertension   . Pre-diabetes     MEDICATIONS AT HOME: Prior to Admission medications   Medication Sig Start Date End Date Taking? Authorizing Provider  amitriptyline (ELAVIL) 50 MG tablet Take 1 tablet (50 mg total) by mouth at bedtime. 01/21/16  Yes Tresa Garter, MD  Biotin (BIOTIN MAXIMUM STRENGTH) 10 MG TABS Take 5,000 mg by mouth daily.    Yes Historical Provider, MD  diclofenac (VOLTAREN) 75 MG EC tablet Take 1 tablet (75 mg total) by mouth 2 (two) times daily. 02/12/16  Yes Tresa Garter, MD  famotidine (PEPCID) 20 MG tablet Take 1 tablet (20 mg total) by mouth daily.  01/21/16  Yes Tresa Garter, MD  gabapentin (NEURONTIN) 300 MG capsule Take 2 capsules (600 mg total) by mouth 3 (three) times daily. 01/21/16  Yes Tresa Garter, MD  hydrochlorothiazide (HYDRODIURIL) 12.5 MG tablet Take 1 tablet (12.5 mg total) by mouth daily. 01/21/16  Yes Tresa Garter, MD  ibuprofen (ADVIL,MOTRIN) 800 MG tablet Take 1 tablet (800 mg total) by mouth every 8 (eight) hours as needed. 05/04/16  Yes Tresa Garter, MD  metFORMIN (GLUCOPHAGE) 500 MG tablet Take 1 tablet (500 mg total) by mouth daily with breakfast. 08/13/15  Yes Jovanie Verge E Doreene Burke, MD  buPROPion (WELLBUTRIN XL) 150 MG 24 hr tablet Take 1 tablet (150 mg total) by mouth daily. 05/04/16   Tresa Garter, MD  hydrOXYzine (ATARAX/VISTARIL) 25 MG tablet Take 1 tablet (25 mg total) by mouth 3 (three) times daily as needed. 05/04/16   Tresa Garter, MD    Objective:   Vitals:   05/04/16 1507  BP: 137/82  Pulse: 100  Resp: 18  Temp: 98.2 F (36.8 C)  TempSrc: Oral  SpO2: 97%  Weight: 176 lb 12.8 oz (80.2 kg)  Height: 5\' 5"  (1.651 m)   Exam General appearance : Awake, alert, not in any distress. Speech Clear. Not toxic looking HEENT: Atraumatic and Normocephalic, pupils equally reactive to light and accomodation Neck: Supple, no JVD. No cervical lymphadenopathy.  Chest: Good air entry bilaterally, no added sounds  CVS: S1 S2 regular, no murmurs.  Abdomen: Bowel sounds  present, Non tender and not distended with no gaurding, rigidity or rebound. Extremities: B/L Lower Ext shows no edema, both legs are warm to touch Neurology: Awake alert, and oriented X 3, CN II-XII intact, Non focal Skin: No Rash  Data Review Lab Results  Component Value Date   HGBA1C 6.3 01/21/2016   HGBA1C 6.1 08/13/2015   HGBA1C 6.10 08/19/2014    Assessment & Plan   1. Type 2 diabetes mellitus without complication, without long-term current use of insulin (HCC)  - POCT A1C - Glucose (CBG)  2.  Essential hypertension  We have discussed target BP range and blood pressure goal. I have advised patient to check BP regularly and to call us back or report to clinic if the numbers are consistently higher than 140/90. We discussed the importance of compliance with medical therapy and DASH diet recommended, consequences of uncontrolled hypertension discussed.  - continue current BP medications  3. Chronic pain syndrome  - ibuprofen (ADVIL,MOTRIN) 800 MG tablet; Take 1 tablet (800 mg total) by mouth every 8 (eight) hours as needed.  Dispense: 60 tablet; Refill: 0  4. Smoking  - buPROPion (WELLBUTRIN XL) 150 MG 24 hr tablet; Take 1 tablet (150 mg total) by mouth daily.  Dispense: 30 tablet; Refill: 3  5. Moderate episode of recurrent major depressive disorder (HCC)  - buPROPion (WELLBUTRIN XL) 150 MG 24 hr tablet; Take 1 tablet (150 mg total) by mouth daily.  Dispense: 30 tablet; Refill: 3 - hydrOXYzine (ATARAX/VISTARIL) 25 MG tablet; Take 1 tablet (25 mg total) by mouth 3 (three) times daily as needed.  Dispense: 60 tablet; Refill: 3  - Patient saw the [place] today, LCSWA discussed benefits of applying healthy coping skills to decrease symptoms. Pt identified healthy strategies to resolve conflict and apply during increased stress in and out of class. Pt verbalized interest in re-initiating behavioral health services with [facility]. LCSWA provided pt with community resources for crisis intervention, therapy, and medication management.    Patient have been counseled extensively about nutrition and exercise. Other issues discussed during this visit include: low cholesterol diet, weight control and daily exercise, importance of adherence with medications and regular follow-up. We also discussed long term complications of uncontrolled hypertension.   Return in about 3 months (around 08/02/2016) for Hemoglobin A1C and Follow up, DM, Follow up HTN, Follow up Pain and comorbidities.  The patient was  given clear instructions to go to ER or return to medical center if symptoms don't improve, worsen or new problems develop. The patient verbalized understanding. The patient was told to call to get lab results if they haven't heard anything in the next week.   This note has been created with Surveyor, quantity. Any transcriptional errors are unintentional.    Angelica Chessman, MD, [place], [place], [place], [place] and [place], [place]   05/04/2016, 4:44 PM

## 2016-05-04 NOTE — Patient Instructions (Signed)
Hypertension Hypertension, commonly called high blood pressure, is when the force of blood pumping through your arteries is too strong. Your arteries are the blood vessels that carry blood from your heart throughout your body. A blood pressure reading consists of a higher number over a lower number, such as 110/72. The higher number (systolic) is the pressure inside your arteries when your heart pumps. The lower number (diastolic) is the pressure inside your arteries when your heart relaxes. Ideally you want your blood pressure below 120/80. Hypertension forces your heart to work harder to pump blood. Your arteries may become narrow or stiff. Having untreated or uncontrolled hypertension can cause heart attack, stroke, kidney disease, and other problems. What increases the risk? Some risk factors for high blood pressure are controllable. Others are not. Risk factors you cannot control include:  Race. You may be at higher risk if you are African American.  Age. Risk increases with age.  Gender. Men are at higher risk than women before age 45 years. After age 65, women are at higher risk than men. Risk factors you can control include:  Not getting enough exercise or physical activity.  Being overweight.  Getting too much fat, sugar, calories, or salt in your diet.  Drinking too much alcohol. What are the signs or symptoms? Hypertension does not usually cause signs or symptoms. Extremely high blood pressure (hypertensive crisis) may cause headache, anxiety, shortness of breath, and nosebleed. How is this diagnosed? To check if you have hypertension, your health care provider will measure your blood pressure while you are seated, with your arm held at the level of your heart. It should be measured at least twice using the same arm. Certain conditions can cause a difference in blood pressure between your right and left arms. A blood pressure reading that is higher than normal on one occasion does  not mean that you need treatment. If it is not clear whether you have high blood pressure, you may be asked to return on a different day to have your blood pressure checked again. Or, you may be asked to monitor your blood pressure at home for 1 or more weeks. How is this treated? Treating high blood pressure includes making lifestyle changes and possibly taking medicine. Living a healthy lifestyle can help lower high blood pressure. You may need to change some of your habits. Lifestyle changes may include:  Following the DASH diet. This diet is high in fruits, vegetables, and whole grains. It is low in salt, red meat, and added sugars.  Keep your sodium intake below 2,300 mg per day.  Getting at least 30-45 minutes of aerobic exercise at least 4 times per week.  Losing weight if necessary.  Not smoking.  Limiting alcoholic beverages.  Learning ways to reduce stress. Your health care provider may prescribe medicine if lifestyle changes are not enough to get your blood pressure under control, and if one of the following is true:  You are 18-59 years of age and your systolic blood pressure is above 140.  You are 60 years of age or older, and your systolic blood pressure is above 150.  Your diastolic blood pressure is above 90.  You have diabetes, and your systolic blood pressure is over 140 or your diastolic blood pressure is over 90.  You have kidney disease and your blood pressure is above 140/90.  You have heart disease and your blood pressure is above 140/90. Your personal target blood pressure may vary depending on your medical   conditions, your age, and other factors. Follow these instructions at home:  Have your blood pressure rechecked as directed by your health care provider.  Take medicines only as directed by your health care provider. Follow the directions carefully. Blood pressure medicines must be taken as prescribed. The medicine does not work as well when you skip  doses. Skipping doses also puts you at risk for problems.  Do not smoke.  Monitor your blood pressure at home as directed by your health care provider. Contact a health care provider if:  You think you are having a reaction to medicines taken.  You have recurrent headaches or feel dizzy.  You have swelling in your ankles.  You have trouble with your vision. Get help right away if:  You develop a severe headache or confusion.  You have unusual weakness, numbness, or feel faint.  You have severe chest or abdominal pain.  You vomit repeatedly.  You have trouble breathing. This information is not intended to replace advice given to you by your health care provider. Make sure you discuss any questions you have with your health care provider. Document Released: 03/28/2005 Document Revised: 09/03/2015 Document Reviewed: 01/18/2013 Elsevier Interactive Patient Education  2017 Elsevier Inc. Generalized Anxiety Disorder Generalized anxiety disorder (GAD) is a mental disorder. It interferes with life functions, including relationships, work, and school. GAD is different from normal anxiety, which everyone experiences at some point in their lives in response to specific life events and activities. Normal anxiety actually helps [place] prepare for and get through these life events and activities. Normal anxiety goes away after the event or activity is over.  GAD causes anxiety that is not necessarily related to specific events or activities. It also causes excess anxiety in proportion to specific events or activities. The anxiety associated with GAD is also difficult to control. GAD can vary from mild to severe. People with severe GAD can have intense waves of anxiety with physical symptoms (panic attacks).  SYMPTOMS The anxiety and worry associated with GAD are difficult to control. This anxiety and worry are related to many life events and activities and also occur more days than not for 6 months  or longer. People with GAD also have three or more of the following symptoms (one or more in children):  Restlessness.   Fatigue.  Difficulty concentrating.   Irritability.  Muscle tension.  Difficulty sleeping or unsatisfying sleep. DIAGNOSIS GAD is diagnosed through an assessment by your health care provider. Your health care provider will ask you questions aboutyour mood,physical symptoms, and events in your life. Your health care provider may ask you about your medical history and use of alcohol or drugs, including prescription medicines. Your health care provider may also do a physical exam and blood tests. Certain medical conditions and the use of certain substances can cause symptoms similar to those associated with GAD. Your health care provider may refer you to a mental health specialist for further evaluation. TREATMENT The following therapies are usually used to treat GAD:   Medication. Antidepressant medication usually is prescribed for long-term daily control. Antianxiety medicines may be added in severe cases, especially when panic attacks occur.   Talk therapy (psychotherapy). Certain types of talk therapy can be helpful in treating GAD by providing support, education, and guidance. A form of talk therapy called cognitive behavioral therapy can teach you healthy ways to think about and react to daily life events and activities.  Stress managementtechniques. These include yoga, meditation, and exercise and can  be very helpful when they are practiced regularly. A mental health specialist can help determine which treatment is best for you. Some people see improvement with one therapy. However, other people require a combination of therapies. This information is not intended to replace advice given to you by your health care provider. Make sure you discuss any questions you have with your health care provider. Document Released: 07/23/2012 Document Revised: 04/18/2014  Document Reviewed: 07/23/2012 Elsevier Interactive Patient Education  2017 [employer]. Major Depressive Disorder, Adult Major depressive disorder (MDD) is a mental health condition. It may also be called clinical depression or unipolar depression. MDD usually causes feelings of sadness, hopelessness, or helplessness. MDD can also cause physical symptoms. It can interfere with work, school, relationships, and other everyday activities. MDD may be mild, moderate, or severe. It may occur once (single episode major depressive disorder) or it may occur multiple times (recurrent major depressive disorder). What are the causes? The exact cause of this condition is not known. MDD is most likely caused by a combination of things, which may include:  Genetic factors. These are traits that are passed along from parent to child.  Individual factors. Your personality, your behavior, and the way you handle your thoughts and feelings may contribute to MDD. This includes personality traits and behaviors learned from others.  Physical factors, such as:  Differences in the part of your brain that controls emotion. This part of your brain may be different than it is in people who do not have MDD.  Long-term (chronic) medical or psychiatric illnesses.  Social factors. Traumatic experiences or major life changes may play a role in the development of MDD. What increases the risk? This condition is more likely to develop in women. The following factors may also make you more likely to develop MDD:  A family history of depression.  Troubled family relationships.  Abnormally low levels of certain brain chemicals.  Traumatic events in childhood, especially abuse or the loss of a parent.  Being under a lot of stress, or long-term stress, especially from upsetting life experiences or losses.  A history of:  Chronic physical illness.  Other mental health disorders.  Substance abuse.  Poor living  conditions.  Experiencing social exclusion or discrimination on a regular basis. What are the signs or symptoms? The main symptoms of MDD typically include:  Constant depressed or irritable mood.  Loss of interest in things and activities. MDD symptoms may also include:  Sleeping or eating too much or too little.  Unexplained weight change.  Fatigue or low energy.  Feelings of worthlessness or guilt.  Difficulty thinking clearly or making decisions.  Thoughts of suicide or of harming others.  Physical agitation or weakness.  Isolation. Severe cases of MDD may also occur with other symptoms, such as:  Delusions or hallucinations, in which you imagine things that are not real (psychotic depression).  Low-level depression that lasts at least a year (chronic depression or persistent depressive disorder).  Extreme sadness and hopelessness (melancholic depression).  Trouble speaking and moving (catatonic depression). How is this diagnosed? This condition may be diagnosed based on:  Your symptoms.  Your medical history, including your mental health history. This may involve tests to evaluate your mental health. You may be asked questions about your lifestyle, including any drug and alcohol use, and how long you have had symptoms of MDD.  A physical exam.  Blood tests to rule out other conditions. You must have a depressed mood and at least  four other MDD symptoms most of the day, nearly every day in the same 2-week timeframe before your health care provider can confirm a diagnosis of MDD. How is this treated? This condition is usually treated by mental health professionals, such as psychologists, psychiatrists, and clinical social workers. You may need more than one type of treatment. Treatment may include:  Psychotherapy. This is also called talk therapy or counseling. Types of psychotherapy include:  Cognitive behavioral therapy (CBT). This type of therapy teaches you to  recognize unhealthy feelings, thoughts, and behaviors, and replace them with positive thoughts and actions.  Interpersonal therapy (IPT). This helps you to improve the way you relate to and communicate with others.  Family therapy. This treatment includes members of your family.  Medicine to treat anxiety and depression, or to help you control certain emotions and behaviors.  Lifestyle changes, such as:  Limiting alcohol and drug use.  Exercising regularly.  Getting plenty of sleep.  Making healthy eating choices.  Spending more time outdoors. Treatments involving stimulation of the brain can be used in situations with extremely severe symptoms, or when medicine or other therapies do not work over time. These treatments include electroconvulsive therapy, transcranial magnetic stimulation, and vagal nerve stimulation. Follow these instructions at home: Activity  Return to your normal activities as told by your health care provider.  Exercise regularly and spend time outdoors as told by your health care provider. General instructions  Take over-the-counter and prescription medicines only as told by your health care provider.  Do not drink alcohol. If you drink alcohol, limit your alcohol intake to no more than 1 drink a day for nonpregnant women and 2 drinks a day for men. One drink equals 12 oz of beer, 5 oz of wine, or 1 oz of hard liquor. Alcohol can affect any antidepressant medicines you are taking. Talk to your health care provider about your alcohol use.  Eat a healthy diet and get plenty of sleep.  Find activities that you enjoy doing, and make time to do them.  Consider joining a support group. Your health care provider may be able to recommend a support group.  Keep all follow-up visits as told by your health care provider. This is important. Where to find more information: [employer] on Mental Illness  www.nami.org U.S. National Institute of Mental  Health  https://carter.com/ National Suicide Prevention Lifeline  1-800-273-TALK (567)043-0165). This is free, 24-hour help. Contact a health care provider if:  Your symptoms get worse.  You develop new symptoms. Get help right away if:  You self-harm.  You have serious thoughts about hurting yourself or others.  You see, hear, taste, smell, or feel things that are not present (hallucinate). This information is not intended to replace advice given to you by your health care provider. Make sure you discuss any questions you have with your health care provider. Document Released: 07/23/2012 Document Revised: 12/03/2015 Document Reviewed: 10/07/2015 Elsevier Interactive Patient Education  2017 [employer].

## 2016-05-13 ENCOUNTER — Other Ambulatory Visit: Payer: Self-pay | Admitting: Obstetrics and Gynecology

## 2016-05-13 DIAGNOSIS — Z1231 Encounter for screening mammogram for malignant neoplasm of breast: Secondary | ICD-10-CM

## 2016-05-23 MED FILL — BUPROPION HCL XL 150 MG TAB: 150 | 30 days supply | Qty: 30 | Fill #0

## 2016-05-23 MED FILL — ?HYDROCHLOROTHIAZIDE 12.5MG: 12.5 | 30 days supply | Qty: 30 | Fill #3

## 2016-05-23 MED FILL — IBUPROFEN 800 MG TABLET: 800 | 20 days supply | Qty: 60 | Fill #1

## 2016-05-23 MED FILL — hydrOXYzine HCL 25 MG TABS: 25 | 20 days supply | Qty: 60 | Fill #0

## 2016-05-31 ENCOUNTER — Encounter (HOSPITAL_COMMUNITY): Payer: Self-pay

## 2016-05-31 ENCOUNTER — Ambulatory Visit
Admission: RE | Admit: 2016-05-31 | Discharge: 2016-05-31 | Disposition: A | Discharge status: Home / Self Care | Payer: No Typology Code available for payment source | Source: Ambulatory Visit | Attending: Obstetrics and Gynecology | Admitting: Obstetrics and Gynecology

## 2016-05-31 ENCOUNTER — Ambulatory Visit (HOSPITAL_COMMUNITY)
Admission: RE | Admit: 2016-05-31 | Discharge: 2016-05-31 | Disposition: A | Discharge status: Home / Self Care | Payer: Self-pay | Source: Ambulatory Visit | Attending: Obstetrics and Gynecology | Admitting: Obstetrics and Gynecology

## 2016-05-31 ENCOUNTER — Other Ambulatory Visit: Payer: Self-pay | Admitting: Internal Medicine

## 2016-05-31 VITALS — BP 114/80 | Temp 98.1°F | Ht 65.0 in | Wt 176.8 lb

## 2016-05-31 DIAGNOSIS — Z1231 Encounter for screening mammogram for malignant neoplasm of breast: Secondary | ICD-10-CM

## 2016-05-31 DIAGNOSIS — R928 Other abnormal and inconclusive findings on diagnostic imaging of breast: Secondary | ICD-10-CM

## 2016-05-31 DIAGNOSIS — Z01419 Encounter for gynecological examination (general) (routine) without abnormal findings: Secondary | ICD-10-CM

## 2016-05-31 NOTE — Addendum Note (Signed)
Encounter addended by: Loletta Parish, RN on: 05/31/2016 11:49 AM<BR>    Actions taken: Sign clinical note

## 2016-05-31 NOTE — Patient Instructions (Addendum)
Explained breast self awareness with [name]. Let patient know [facility] will cover Pap smears and HPV typing every 5 years unless has a history of abnormal Pap smears. Referred patient to the [facility] for a screening mammogram. Appointment scheduled for Tuesday, May 31, 2016 at 1010. Let patient know will follow up with her within the next couple weeks with results with results of Pap smear by phone. Informed patient that the [facility] will follow up with her within the next couple of weeks with results of mammogram by letter or phone. Discussed smoking cessation with patient. Referred patient to the [facility] Quitline and gave resources to the free smoking cessation classes offered through [facility]. [name] verbalized understanding.  [place], Arvil Chaco, RN 10:46 AM

## 2016-05-31 NOTE — Progress Notes (Signed)
No complaints today.   Pap Smear: Pap smear completed today. Last Pap smear was 12/18/2012 at [facility] and normal. Per patient has no history of an abnormal Pap smear. Last Pap smear result is in EPIC.  Physical exam: Breasts Breasts symmetrical. No skin abnormalities bilateral breasts. No nipple retraction bilateral breasts. No nipple discharge bilateral breasts. No lymphadenopathy. No lumps palpated bilateral breasts. No complaints of pain or tenderness on exam. Referred patient to the [facility] for a screening mammogram. Appointment scheduled for Tuesday, May 31, 2016 at 1010.  Pelvic/Bimanual   Ext Genitalia No lesions, no swelling and no discharge observed on external genitalia.         Vagina Vagina pink and normal texture. No lesions and scant amount of thick white vaginal discharge observed in vagina.          Cervix Cervix is present. Cervix pink and of normal texture. No discharge observed.     Uterus Uterus is present and palpable. Uterus in normal position and normal size.        Adnexae Bilateral ovaries present and palpable. No tenderness on palpation.          Rectovaginal No rectal exam completed today since patient had no rectal complaints. No skin abnormalities observed on exam.    Smoking History: Patient is a current smoker. Discussed smoking cessation with patient. Referred patient to the [facility] Quitline and gave resources to the free smoking cessation classes offered through [facility].  Patient Navigation: Patient education provided. Access to services provided for patient through [place] program.   Colorectal Cancer Screening: Per patient has never had a colonoscopy completed. No complaints today. FIT Test given to patient to complete and return to [facility].

## 2016-05-31 NOTE — Addendum Note (Signed)
Encounter addended by: Armond Hang, LPN on: [id number]<BR>    Actions taken: Order list changed

## 2016-06-01 ENCOUNTER — Other Ambulatory Visit: Payer: Self-pay | Admitting: Obstetrics and Gynecology

## 2016-06-01 DIAGNOSIS — R928 Other abnormal and inconclusive findings on diagnostic imaging of breast: Secondary | ICD-10-CM

## 2016-06-01 LAB — CYTOLOGY - PAP
DIAGNOSIS: NEGATIVE
HPV: NOT DETECTED

## 2016-06-02 ENCOUNTER — Telehealth: Payer: Self-pay | Admitting: *Deleted

## 2016-06-02 NOTE — Telephone Encounter (Signed)
-----   Message from Tresa Garter, MD sent at 05/31/2016  4:25 PM EST ----- Please inform patient of possible lump in right breast needing further evaluation. R Breast Ultrasound and diagnostic mammogram ordered.

## 2016-06-02 NOTE — Telephone Encounter (Signed)
MA informed patient of Right breast showing possible lump and an [place] and diagnostic mammogram being ordered. Patient was advised to call [phone] to schedule appointments. Medical Assistant left message on patient's home and cell voicemail. Voicemail states to give a call back to [place] with [facility] at [phone].

## 2016-06-03 ENCOUNTER — Encounter (HOSPITAL_COMMUNITY): Payer: Self-pay | Admitting: *Deleted

## 2016-06-03 ENCOUNTER — Other Ambulatory Visit: Payer: Self-pay

## 2016-06-07 ENCOUNTER — Ambulatory Visit
Admission: RE | Admit: 2016-06-07 | Discharge: 2016-06-07 | Disposition: A | Discharge status: Home / Self Care | Payer: No Typology Code available for payment source | Source: Ambulatory Visit | Attending: Obstetrics and Gynecology | Admitting: Obstetrics and Gynecology

## 2016-06-07 DIAGNOSIS — R928 Other abnormal and inconclusive findings on diagnostic imaging of breast: Secondary | ICD-10-CM

## 2016-06-16 LAB — TEST CODE CHANGE

## 2016-06-16 LAB — FECAL OCCULT BLOOD, IMMUNOCHEMICAL: Fecal Occult Bld: NEGATIVE

## 2016-06-20 ENCOUNTER — Telehealth (HOSPITAL_COMMUNITY): Payer: Self-pay | Admitting: *Deleted

## 2016-06-20 ENCOUNTER — Encounter (HOSPITAL_COMMUNITY): Payer: Self-pay | Admitting: *Deleted

## 2016-06-20 NOTE — Telephone Encounter (Signed)
Telephoned patient at home number and left message to return call to [facility] for results to pap smear and Fit Test.

## 2016-07-01 ENCOUNTER — Telehealth (HOSPITAL_COMMUNITY): Payer: Self-pay | Admitting: *Deleted

## 2016-07-01 NOTE — Telephone Encounter (Signed)
Telephoned patient at home number and left message to return call to [facility]. Need to give results to FIt Test and pap smear.

## 2016-07-06 ENCOUNTER — Ambulatory Visit (HOSPITAL_COMMUNITY)
Admission: RE | Admit: 2016-07-06 | Discharge: 2016-07-06 | Disposition: A | Discharge status: Home / Self Care | Payer: No Typology Code available for payment source | Source: Ambulatory Visit | Attending: Family Medicine | Admitting: Family Medicine

## 2016-07-06 ENCOUNTER — Ambulatory Visit: Payer: Self-pay | Attending: Internal Medicine | Admitting: Internal Medicine

## 2016-07-06 ENCOUNTER — Other Ambulatory Visit: Payer: Self-pay

## 2016-07-06 ENCOUNTER — Encounter: Payer: Self-pay | Admitting: Internal Medicine

## 2016-07-06 VITALS — BP 121/82 | HR 86 | Temp 98.9°F | Resp 18 | Ht 63.0 in | Wt 175.0 lb

## 2016-07-06 DIAGNOSIS — G894 Chronic pain syndrome: Secondary | ICD-10-CM

## 2016-07-06 DIAGNOSIS — F17210 Nicotine dependence, cigarettes, uncomplicated: Secondary | ICD-10-CM | POA: Insufficient documentation

## 2016-07-06 DIAGNOSIS — F331 Major depressive disorder, recurrent, moderate: Secondary | ICD-10-CM

## 2016-07-06 DIAGNOSIS — Z029 Encounter for administrative examinations, unspecified: Secondary | ICD-10-CM | POA: Insufficient documentation

## 2016-07-06 DIAGNOSIS — Z79899 Other long term (current) drug therapy: Secondary | ICD-10-CM | POA: Insufficient documentation

## 2016-07-06 DIAGNOSIS — K029 Dental caries, unspecified: Secondary | ICD-10-CM | POA: Insufficient documentation

## 2016-07-06 DIAGNOSIS — Z7984 Long term (current) use of oral hypoglycemic drugs: Secondary | ICD-10-CM | POA: Insufficient documentation

## 2016-07-06 DIAGNOSIS — F17200 Nicotine dependence, unspecified, uncomplicated: Secondary | ICD-10-CM

## 2016-07-06 DIAGNOSIS — I10 Essential (primary) hypertension: Secondary | ICD-10-CM

## 2016-07-06 DIAGNOSIS — Z885 Allergy status to narcotic agent status: Secondary | ICD-10-CM | POA: Insufficient documentation

## 2016-07-06 DIAGNOSIS — Z888 Allergy status to other drugs, medicaments and biological substances status: Secondary | ICD-10-CM | POA: Insufficient documentation

## 2016-07-06 DIAGNOSIS — F419 Anxiety disorder, unspecified: Secondary | ICD-10-CM | POA: Insufficient documentation

## 2016-07-06 DIAGNOSIS — E119 Type 2 diabetes mellitus without complications: Secondary | ICD-10-CM

## 2016-07-06 MED ORDER — IBUPROFEN 800 MG PO TABS: 800.0000 mg | ORAL_TABLET | Freq: Three times a day (TID) | ORAL | 0 refills | Status: DC | PRN

## 2016-07-06 MED ORDER — FAMOTIDINE 20 MG PO TABS: 20.0000 mg | ORAL_TABLET | Freq: Every day | ORAL | 3 refills | Status: DC

## 2016-07-06 NOTE — Progress Notes (Signed)
Patient is here for FU med management and DM  Patient complains of constant mouth/tooth pain being present. Patient states she has not been coping well with there depression and has not began taking the prescribed medications to assist in controlling her anxiety.  Patient has taken medication today. Patient has eaten today.  Patient denies any suicidal ideations at this time.

## 2016-07-06 NOTE — Patient Instructions (Addendum)
Hypertension Hypertension, commonly called high blood pressure, is when the force of blood pumping through the arteries is too strong. The arteries are the blood vessels that carry blood from the heart throughout the body. Hypertension forces the heart to work harder to pump blood and may cause arteries to become narrow or stiff. Having untreated or uncontrolled hypertension can cause heart attacks, strokes, kidney disease, and other problems. A blood pressure reading consists of a higher number over a lower number. Ideally, your blood pressure should be below 120/80. The first ("top") number is called the systolic pressure. It is a measure of the pressure in your arteries as your heart beats. The second ("bottom") number is called the diastolic pressure. It is a measure of the pressure in your arteries as the heart relaxes. What are the causes? The cause of this condition is not known. What increases the risk? Some risk factors for high blood pressure are under your control. Others are not. Factors you can change   Smoking.  Having type 2 diabetes mellitus, high cholesterol, or both.  Not getting enough exercise or physical activity.  Being overweight.  Having too much fat, sugar, calories, or salt (sodium) in your diet.  Drinking too much alcohol. Factors that are difficult or impossible to change   Having chronic kidney disease.  Having a family history of high blood pressure.  Age. Risk increases with age.  Race. You may be at higher risk if you are African-American.  Gender. Men are at higher risk than women before age 45. After age 65, women are at higher risk than men.  Having obstructive sleep apnea.  Stress. What are the signs or symptoms? Extremely high blood pressure (hypertensive crisis) may cause:  Headache.  Anxiety.  Shortness of breath.  Nosebleed.  Nausea and vomiting.  Severe chest pain.  Jerky movements you cannot control (seizures). How is this  diagnosed? This condition is diagnosed by measuring your blood pressure while you are seated, with your arm resting on a surface. The cuff of the blood pressure monitor will be placed directly against the skin of your upper arm at the level of your heart. It should be measured at least twice using the same arm. Certain conditions can cause a difference in blood pressure between your right and left arms. Certain factors can cause blood pressure readings to be lower or higher than normal (elevated) for a short period of time:  When your blood pressure is higher when you are in a health care provider's office than when you are at home, this is called white coat hypertension. Most people with this condition do not need medicines.  When your blood pressure is higher at home than when you are in a health care provider's office, this is called masked hypertension. Most people with this condition may need medicines to control blood pressure. If you have a high blood pressure reading during one visit or you have normal blood pressure with other risk factors:  You may be asked to return on a different day to have your blood pressure checked again.  You may be asked to monitor your blood pressure at home for 1 week or longer. If you are diagnosed with hypertension, you may have other blood or imaging tests to help your health care provider understand your overall risk for other conditions. How is this treated? This condition is treated by making healthy lifestyle changes, such as eating healthy foods, exercising more, and reducing your alcohol intake. Your health   care provider may prescribe medicine if lifestyle changes are not enough to get your blood pressure under control, and if:  Your systolic blood pressure is above 130.  Your diastolic blood pressure is above 80. Your personal target blood pressure may vary depending on your medical conditions, your age, and other factors. Follow these instructions  at home: Eating and drinking   Eat a diet that is high in fiber and potassium, and low in sodium, added sugar, and fat. An example eating plan is called the DASH (Dietary Approaches to Stop Hypertension) diet. To eat this way:  Eat plenty of fresh fruits and vegetables. Try to fill half of your plate at each meal with fruits and vegetables.  Eat whole grains, such as whole wheat pasta, brown rice, or whole grain bread. Fill about one quarter of your plate with whole grains.  Eat or drink low-fat dairy products, such as skim milk or low-fat yogurt.  Avoid fatty cuts of meat, processed or cured meats, and poultry with skin. Fill about one quarter of your plate with lean proteins, such as fish, chicken without skin, beans, eggs, and tofu.  Avoid premade and processed foods. These tend to be higher in sodium, added sugar, and fat.  Reduce your daily sodium intake. Most people with hypertension should eat less than 1,500 mg of sodium a day.  Limit alcohol intake to no more than 1 drink a day for nonpregnant women and 2 drinks a day for men. One drink equals 12 oz of beer, 5 oz of wine, or 1 oz of hard liquor. Lifestyle   Work with your health care provider to maintain a healthy body weight or to lose weight. Ask what an ideal weight is for you.  Get at least 30 minutes of exercise that causes your heart to beat faster (aerobic exercise) most days of the week. Activities may include walking, swimming, or biking.  Include exercise to strengthen your muscles (resistance exercise), such as pilates or lifting weights, as part of your weekly exercise routine. Try to do these types of exercises for 30 minutes at least 3 days a week.  Do not use any products that contain nicotine or tobacco, such as cigarettes and e-cigarettes. If you need help quitting, ask your health care provider.  Monitor your blood pressure at home as told by your health care provider.  Keep all follow-up visits as told by  your health care provider. This is important. Medicines   Take over-the-counter and prescription medicines only as told by your health care provider. Follow directions carefully. Blood pressure medicines must be taken as prescribed.  Do not skip doses of blood pressure medicine. Doing this puts you at risk for problems and can make the medicine less effective.  Ask your health care provider about side effects or reactions to medicines that you should watch for. Contact a health care provider if:  You think you are having a reaction to a medicine you are taking.  You have headaches that keep coming back (recurring).  You feel dizzy.  You have swelling in your ankles.  You have trouble with your vision. Get help right away if:  You develop a severe headache or confusion.  You have unusual weakness or numbness.  You feel faint.  You have severe pain in your chest or abdomen.  You vomit repeatedly.  You have trouble breathing. Summary  Hypertension is when the force of blood pumping through your arteries is too strong. If this condition is   not controlled, it may put you at risk for serious complications.  Your personal target blood pressure may vary depending on your medical conditions, your age, and other factors. For most people, a normal blood pressure is less than 120/80.  Hypertension is treated with lifestyle changes, medicines, or a combination of both. Lifestyle changes include weight loss, eating a healthy, low-sodium diet, exercising more, and limiting alcohol. This information is not intended to replace advice given to you by your health care provider. Make sure you discuss any questions you have with your health care provider. Document Released: 03/28/2005 Document Revised: 02/24/2016 Document Reviewed: 02/24/2016 Elsevier Interactive Patient Education  2017 [employer].  Major Depressive Disorder, Adult Major depressive disorder (MDD) is a mental health  condition. MDD often makes you feel sad, hopeless, or helpless. MDD can also cause symptoms in your body. MDD can affect your:  Work.  School.  Relationships.  Other normal activities. MDD can range from mild to very bad. It may occur once (single episode MDD). It can also occur many times (recurrent MDD). The main symptoms of MDD often include:  Feeling sad, depressed, or irritable most of the time.  Loss of interest. MDD symptoms also include:  Sleeping too much or too little.  Eating too much or too little.  A change in your weight.  Feeling tired (fatigue) or having low energy.  Feeling worthless.  Feeling guilty.  Trouble making decisions.  Trouble thinking clearly.  Thoughts of suicide or harming others.  Feeling weak.  Feeling agitated.  Keeping yourself from being around other people (isolation). Follow these instructions at home: Activity   Do these things as told by your doctor:  Go back to your normal activities.  Exercise regularly.  Spend time outdoors. Alcohol   Talk with your doctor about how alcohol can affect your antidepressant medicines.  Do not drink alcohol. Or, limit how much alcohol you drink.  This means no more than 1 drink a day for nonpregnant women and 2 drinks a day for men. One drink equals one of these:  12 oz of beer.  5 oz of wine.  1 oz of hard liquor. General instructions   Take over-the-counter and prescription medicines only as told by your doctor.  Eat a healthy diet.  Get plenty of sleep.  Find activities that you enjoy. Make time to do them.  Think about joining a support group. Your doctor may be able to suggest a group for you.  Keep all follow-up visits as told by your doctor. This is important. Where to find more information:   [employer] on Mental Illness:  www.nami.org  U.[place]. National Institute of Mental Health:  https://carter.com/  National Suicide Prevention  Lifeline:  715-429-8326. This is free, 24-hour help. Contact a doctor if:  Your symptoms get worse.  You have new symptoms. Get help right away if:  You self-harm.  You see, hear, taste, smell, or feel things that are not present (hallucinate). If you ever feel like you may hurt yourself or others, or have thoughts about taking your own life, get help right away. You can go to your nearest emergency department or call:  Your local emergency services (911 in the [place]  A suicide crisis helpline, such as the [place]:  312-625-9121. This is open 24 hours a day. This information is not intended to replace advice given to you by your health care provider. Make sure you discuss any questions you have with your health  care provider. Document Released: 03/09/2015 Document Revised: 12/13/2015 Document Reviewed: 12/13/2015 Elsevier Interactive Patient Education  2017 [employer].

## 2016-07-06 NOTE — Progress Notes (Signed)
Kendra Griffith, is a 51 y.o. female  SJG:[id number]  [id number]:[id number]  DOB - 1966-01-11  Chief Complaint  Patient presents with  . Medication Management       Subjective:   Kendra Griffith is a 51 y.o. female with history of hypertension, prediabetes, chronic pain syndrome, anxiety and depression here today for a follow up visit. Patient says she is doing much better with her depression, she is in school and now coping well. She denies any suicidal thoughts. She is non-adherent with her depression medications because she thinks the medications are not working fast enough. She will like to start taking them today after counseling and education session. Patient has No headache, No chest pain, No abdominal pain - No Nausea, No new weakness tingling or numbness, No Cough - SOB. Her diabetes is controlled as well as hypertension. She is awaiting dental care appointment for her dental caries and pain.  Problem  Type 2 Diabetes Mellitus Without Complication, Without Long-Term Current Use of Insulin (Hcc)    ALLERGIES: Allergies  Allergen Reactions  . Darvocet [Propoxyphene N-Acetaminophen]     Makes her jittery  . Hydrocodone-Acetaminophen     REACTION: Upset stomach  . Percocet [Oxycodone-Acetaminophen]     Makes her jittery  . Valium [Diazepam] Other (See Comments)    UNKNOWN Makes pt. Feel "out of wack"    PAST MEDICAL HISTORY: Past Medical History:  Diagnosis Date  . Anemia   . Anxiety   . Depression   . Hypertension   . Pre-diabetes     MEDICATIONS AT HOME: Prior to Admission medications   Medication Sig Start Date End Date Taking? Authorizing Provider  Biotin (BIOTIN MAXIMUM STRENGTH) 10 MG TABS Take 5,000 mg by mouth daily.    Yes Historical Provider, MD  diclofenac (VOLTAREN) 75 MG EC tablet Take 1 tablet (75 mg total) by mouth 2 (two) times daily. 02/12/16  Yes Tresa Garter, MD  famotidine (PEPCID) 20 MG tablet Take 1 tablet (20 mg total) by mouth daily. 07/06/16   Yes Tresa Garter, MD  gabapentin (NEURONTIN) 300 MG capsule Take 2 capsules (600 mg total) by mouth 3 (three) times daily. 01/21/16  Yes Tresa Garter, MD  hydrochlorothiazide (HYDRODIURIL) 12.5 MG tablet Take 1 tablet (12.5 mg total) by mouth daily. 01/21/16  Yes Tresa Garter, MD  hydrOXYzine (ATARAX/VISTARIL) 25 MG tablet Take 1 tablet (25 mg total) by mouth 3 (three) times daily as needed. 05/04/16  Yes Tresa Garter, MD  ibuprofen (ADVIL,MOTRIN) 800 MG tablet Take 1 tablet (800 mg total) by mouth every 8 (eight) hours as needed. 07/06/16  Yes Tresa Garter, MD  metFORMIN (GLUCOPHAGE) 500 MG tablet Take 1 tablet (500 mg total) by mouth daily with breakfast. 08/13/15  Yes Tresa Garter, MD  amitriptyline (ELAVIL) 50 MG tablet Take 1 tablet (50 mg total) by mouth at bedtime. Patient not taking: Reported on 07/06/2016 01/21/16   Tresa Garter, MD  buPROPion (WELLBUTRIN XL) 150 MG 24 hr tablet Take 1 tablet (150 mg total) by mouth daily. Patient not taking: Reported on 07/06/2016 05/04/16   Tresa Garter, MD    Objective:   Vitals:   07/06/16 1455  BP: 121/82  Pulse: 86  Resp: 18  Temp: 98.9 F (37.2 C)  TempSrc: Oral  SpO2: 100%  Weight: 175 lb (79.4 kg)  Height: 5\' 3"  (1.6 m)   Exam General appearance : Awake, alert, not in any distress. Speech Clear. Not toxic looking  HEENT: Atraumatic and Normocephalic, pupils equally reactive to light and accomodation Neck: Supple, no JVD. No cervical lymphadenopathy.  Chest: Good air entry bilaterally, no added sounds  CVS: S1 S2 regular, no murmurs.  Abdomen: Bowel sounds present, Non tender and not distended with no gaurding, rigidity or rebound. Extremities: B/L Lower Ext shows no edema, both legs are warm to touch Neurology: Awake alert, and oriented X 3, CN II-XII intact, Non focal Skin: No Rash  Data Review Lab Results  Component Value Date   HGBA1C 6.3 01/21/2016   HGBA1C 6.1 08/13/2015     HGBA1C 6.10 08/19/2014    Assessment & Plan   1. Essential hypertension, benign  We have discussed target BP range and blood pressure goal. I have advised patient to check BP regularly and to call us back or report to clinic if the numbers are consistently higher than 140/90. We discussed the importance of compliance with medical therapy and DASH diet recommended, consequences of uncontrolled hypertension discussed.  - continue current BP medications  2. Type 2 diabetes mellitus without complication, without long-term current use of insulin (HCC)  Aim for 30 minutes of exercise most days. Rethink what you drink. Water is great! Aim for 2-3 Carb Choices per meal (30-45 grams) +/- 1 either way  Aim for 0-15 Carbs per snack if hungry  Include protein in moderation with your meals and snacks  Consider reading food labels for Total Carbohydrate and Fat Grams of foods  Consider checking BG at alternate times per day  Continue taking medication as directed Be mindful about how much sugar you are adding to beverages and other foods. Fruit Punch - find one with no sugar  Measure and decrease portions of carbohydrate foods  Make your plate and don't go back for seconds  3. Chronic pain syndrome  - famotidine (PEPCID) 20 MG tablet; Take 1 tablet (20 mg total) by mouth daily.  Dispense: 60 tablet; Refill: 3 - ibuprofen (ADVIL,MOTRIN) 800 MG tablet; Take 1 tablet (800 mg total) by mouth every 8 (eight) hours as needed.  Dispense: 60 tablet; Refill: 0  4. Smoking  Oralia was counseled on the dangers of tobacco use, and was advised to quit. Reviewed strategies to maximize success, including removing cigarettes and smoking materials from environment, stress management and support of family/friends.   5. Moderate episode of recurrent major depressive disorder ([place])  - Continue therapy sessions - Keep appointment with specialists - Continue current medication, patient counseled and  encouraged to take antidepressants  Patient have been counseled extensively about nutrition and exercise. Other issues discussed during this visit include: low cholesterol diet, weight control and daily exercise, foot care, annual eye examinations at Ophthalmology, importance of adherence with medications and regular follow-up. We also discussed long term complications of uncontrolled diabetes and hypertension.   Return in about 3 months (around 10/06/2016) for Hemoglobin A1C and Follow up, DM, Follow up HTN, Depression and Anxiety.  The patient was given clear instructions to go to ER or return to medical center if symptoms don't improve, worsen or new problems develop. The patient verbalized understanding. The patient was told to call to get lab results if they haven't heard anything in the next week.   This note has been created with Surveyor, quantity. Any transcriptional errors are unintentional.    Angelica Chessman, MD, [place], Karilyn C[place], [place] and [place], [place]   07/06/2016, 3:34 PM

## 2016-07-08 ENCOUNTER — Telehealth (HOSPITAL_COMMUNITY): Payer: Self-pay | Admitting: *Deleted

## 2016-07-08 NOTE — Telephone Encounter (Signed)
Telephoned patient at home number and advised patient of negative pap smear results. HPV was negative. Next pap smear due in five years. FIt Test was negative. Patient voiced understanding.

## 2016-07-08 NOTE — Telephone Encounter (Signed)
Telephoned patient at home number and left message to return call to [facility] 

## 2016-07-11 MED FILL — ?FAMOTIDINE 20 MG TABLET: 20 | 30 days supply | Qty: 30 | Fill #0

## 2016-07-11 MED FILL — IBUPROFEN 800 MG TABLET: 800 | 20 days supply | Qty: 60 | Fill #0

## 2016-08-08 MED FILL — AMOXICILLIN 500 MG CAPSULE: 500 | 7 days supply | Qty: 21 | Fill #0

## 2016-08-26 ENCOUNTER — Other Ambulatory Visit: Payer: Self-pay | Admitting: Internal Medicine

## 2016-08-26 DIAGNOSIS — G894 Chronic pain syndrome: Secondary | ICD-10-CM

## 2016-08-26 MED FILL — IBUPROFEN 800 MG TABLET: 800 | 20 days supply | Qty: 60 | Fill #0

## 2016-08-26 MED FILL — HYDROCHLOROTHIAZIDE 12.5 MG: 12.5 | 30 days supply | Qty: 30 | Fill #1

## 2016-09-12 ENCOUNTER — Other Ambulatory Visit: Payer: Self-pay | Admitting: Internal Medicine

## 2016-09-12 DIAGNOSIS — G894 Chronic pain syndrome: Secondary | ICD-10-CM

## 2016-09-12 MED FILL — IBUPROFEN 800 MG TABLET: 800 | 20 days supply | Qty: 60 | Fill #0

## 2016-09-19 ENCOUNTER — Ambulatory Visit: Payer: No Typology Code available for payment source

## 2016-09-26 ENCOUNTER — Other Ambulatory Visit: Payer: Self-pay | Admitting: Internal Medicine

## 2016-09-26 ENCOUNTER — Telehealth: Payer: Self-pay | Admitting: Internal Medicine

## 2016-09-26 DIAGNOSIS — G894 Chronic pain syndrome: Secondary | ICD-10-CM

## 2016-09-26 MED FILL — IBUPROFEN 800 MG TABLET: 800 | 20 days supply | Qty: 60 | Fill #0

## 2016-09-26 NOTE — Telephone Encounter (Signed)
Dentist will determine need for antibiotics. Tylenol #3 prescribed

## 2016-09-26 NOTE — Telephone Encounter (Signed)
Pt. Is requesting a prescription for tylenol #3 and amoxicillin for her tooth ache.  Patient has scheduled appt. With dentist tomorrow.  Please advised.

## 2016-09-27 MED FILL — PENICILLIN VK 500 MG TABLET: 500 | 7 days supply | Qty: 21 | Fill #0

## 2016-09-29 ENCOUNTER — Telehealth: Payer: Self-pay | Admitting: Internal Medicine

## 2016-09-29 NOTE — Telephone Encounter (Signed)
Pt calling to request a script for Tylenol 3. States that she is in excruciating dental pain and was told that she would get a script for Tylenol 3 but she forgot to get it during her visit. Requests [facility] to call pt when script is ready for pickup, states she is coming 09/30/16 for eligibility appt, would like to know if script may be ready for Friday.

## 2016-09-30 ENCOUNTER — Ambulatory Visit: Payer: Self-pay | Attending: Internal Medicine

## 2016-09-30 ENCOUNTER — Other Ambulatory Visit: Payer: Self-pay | Admitting: Internal Medicine

## 2016-09-30 MED ORDER — ACETAMINOPHEN-CODEINE #3 300-30 MG PO TABS: 1.0000 | ORAL_TABLET | ORAL | 0 refills | Status: DC | PRN

## 2016-09-30 NOTE — Telephone Encounter (Signed)
Pt. Came to facility requesting an Rx for Tylenol # 3. Pt. States she has dental pain. Please f/u

## 2016-10-05 ENCOUNTER — Encounter: Payer: Self-pay | Admitting: Internal Medicine

## 2016-10-05 ENCOUNTER — Ambulatory Visit: Payer: Self-pay | Attending: Internal Medicine | Admitting: Internal Medicine

## 2016-10-05 VITALS — BP 139/92 | HR 74 | Temp 98.2°F | Resp 18 | Ht 63.0 in | Wt 171.2 lb

## 2016-10-05 DIAGNOSIS — Z885 Allergy status to narcotic agent status: Secondary | ICD-10-CM | POA: Insufficient documentation

## 2016-10-05 DIAGNOSIS — G894 Chronic pain syndrome: Secondary | ICD-10-CM

## 2016-10-05 DIAGNOSIS — E119 Type 2 diabetes mellitus without complications: Secondary | ICD-10-CM

## 2016-10-05 DIAGNOSIS — Z7984 Long term (current) use of oral hypoglycemic drugs: Secondary | ICD-10-CM | POA: Insufficient documentation

## 2016-10-05 DIAGNOSIS — Z1211 Encounter for screening for malignant neoplasm of colon: Secondary | ICD-10-CM

## 2016-10-05 DIAGNOSIS — F331 Major depressive disorder, recurrent, moderate: Secondary | ICD-10-CM

## 2016-10-05 DIAGNOSIS — I10 Essential (primary) hypertension: Secondary | ICD-10-CM

## 2016-10-05 DIAGNOSIS — K029 Dental caries, unspecified: Secondary | ICD-10-CM

## 2016-10-05 DIAGNOSIS — D649 Anemia, unspecified: Secondary | ICD-10-CM | POA: Insufficient documentation

## 2016-10-05 DIAGNOSIS — F419 Anxiety disorder, unspecified: Secondary | ICD-10-CM | POA: Insufficient documentation

## 2016-10-05 LAB — GLUCOSE, POCT (MANUAL RESULT ENTRY): POC GLUCOSE: 111 mg/dL — AB (ref 70–99)

## 2016-10-05 LAB — POCT GLYCOSYLATED HEMOGLOBIN (HGB A1C): Hemoglobin A1C: 6.3

## 2016-10-05 MED ORDER — FAMOTIDINE 20 MG PO TABS: 20.0000 mg | ORAL_TABLET | Freq: Every day | ORAL | 3 refills | Status: DC

## 2016-10-05 MED ORDER — DULOXETINE HCL 30 MG PO CPEP: 30.0000 mg | ORAL_CAPSULE | Freq: Every day | ORAL | 3 refills | Status: DC

## 2016-10-05 MED ORDER — AMITRIPTYLINE HCL 50 MG PO TABS: 50.0000 mg | ORAL_TABLET | Freq: Every day | ORAL | 3 refills | Status: DC

## 2016-10-05 MED ORDER — METFORMIN HCL ER 500 MG PO TB24: 500.0000 mg | ORAL_TABLET | Freq: Every day | ORAL | 3 refills | Status: DC

## 2016-10-05 MED FILL — ACETAMINOPHEN/COD #3 TABLET: 300-30 | 10 days supply | Qty: 60 | Fill #0

## 2016-10-05 NOTE — Progress Notes (Signed)
Kendra Griffith, is a 51 y.o. female  JKK:[id number]  ZJI:[id number]  DOB - 1965/07/29  Chief Complaint  Patient presents with  . Dental Pain     Subjective:   Kendra Griffith is a 51 y.o. female with history of hypertension, prediabetes, chronic pain syndrome, anxiety and depression here today for a follow up visit and medication refills. Patient needs another referral to the dentist, the previous appointment was cancelled because she could not afford the required upfront payment. She noticed one of her teeth chipped off last weekend and she feels pain afterwards. She feels better with her depression, she thinks the new medication is working (Cymbalta). She also attends school, keeps busy which also helps. She denies any suicidal ideation or thoughts. Patient has No headache, No chest pain, No abdominal pain - No Nausea, No new weakness tingling or numbness, No Cough - SOB.  No problems updated.  ALLERGIES: Allergies  Allergen Reactions  . Darvocet [Propoxyphene N-Acetaminophen]     Makes her jittery  . Hydrocodone-Acetaminophen     REACTION: Upset stomach  . Percocet [Oxycodone-Acetaminophen]     Makes her jittery  . Valium [Diazepam] Other (See Comments)    UNKNOWN Makes pt. Feel "out of wack"    PAST MEDICAL HISTORY: Past Medical History:  Diagnosis Date  . Anemia   . Anxiety   . Depression   . Hypertension   . Pre-diabetes     MEDICATIONS AT HOME: Prior to Admission medications   Medication Sig Start Date End Date Taking? Authorizing Provider  acetaminophen-codeine (TYLENOL #3) 300-30 MG tablet Take 1 tablet by mouth every 4 (four) hours as needed. 09/30/16  Yes Kendricks Reap, Marlena Clipper, MD  Biotin (BIOTIN MAXIMUM STRENGTH) 10 MG TABS Take 5,000 mg by mouth daily.    Yes [provider]  diclofenac (VOLTAREN) 75 MG EC tablet Take 1 tablet (75 mg total) by mouth 2 (two) times daily. 02/12/16  Yes Tresa Garter, MD  famotidine (PEPCID) 20 MG tablet Take 1  tablet (20 mg total) by mouth daily. 10/05/16  Yes Tresa Garter, MD  gabapentin (NEURONTIN) 300 MG capsule Take 2 capsules (600 mg total) by mouth 3 (three) times daily. 01/21/16  Yes Tresa Garter, MD  hydrochlorothiazide (HYDRODIURIL) 12.5 MG tablet Take 1 tablet (12.5 mg total) by mouth daily. 01/21/16  Yes Tresa Garter, MD  hydrOXYzine (ATARAX/VISTARIL) 25 MG tablet Take 1 tablet (25 mg total) by mouth 3 (three) times daily as needed. 05/04/16  Yes Yasmyn Bellisario E, MD  ibuprofen (ADVIL,MOTRIN) 800 MG tablet TAKE 1 TABLET BY MOUTH EVERY 8 HOURS AS NEEDED. 09/26/16  Yes Brittnie Lewey E, MD  amitriptyline (ELAVIL) 50 MG tablet Take 1 tablet (50 mg total) by mouth at bedtime. 10/05/16   Tresa Garter, MD  DULoxetine (CYMBALTA) 30 MG capsule Take 1 capsule (30 mg total) by mouth daily. 10/05/16   Tresa Garter, MD  metFORMIN (GLUCOPHAGE-XR) 500 MG 24 hr tablet Take 1 tablet (500 mg total) by mouth daily with breakfast. 10/05/16   Tresa Garter, MD    Objective:   Vitals:   10/05/16 0936  BP: (!) 139/92  Pulse: 74  Resp: 18  Temp: 98.2 F (36.8 C)  TempSrc: Oral  SpO2: 99%  Weight: 171 lb 3.2 oz (77.7 kg)  Height: '5\' 3"'$  (1.6 m)   Exam General appearance : Awake, alert, not in any distress. Speech Clear. Not toxic looking, poor dentition. HEENT: Atraumatic and Normocephalic, pupils equally reactive  to light and accomodation Neck: Supple, no JVD. No cervical lymphadenopathy.  Chest: Good air entry bilaterally, no added sounds  CVS: S1 S2 regular, no murmurs.  Abdomen: Bowel sounds present, Non tender and not distended with no gaurding, rigidity or rebound. Extremities: B/L Lower Ext shows no edema, both legs are warm to touch Neurology: Awake alert, and oriented X 3, CN II-XII intact, Non focal Skin: No Rash  Data Review Lab Results  Component Value Date   HGBA1C 6.3 10/05/2016   HGBA1C 6.3 01/21/2016   HGBA1C 6.1 08/13/2015     Assessment & Plan   1. Type 2 diabetes mellitus without complication, without long-term current use of insulin (HCC)  - POCT A1C - Glucose (CBG) - POCT UA - Microalbumin Refill - metFORMIN (GLUCOPHAGE-XR) 500 MG 24 hr tablet; Take 1 tablet (500 mg total) by mouth daily with breakfast.  Dispense: 90 tablet; Refill: 3  - CBC with Differential/Platelet - Lipid panel  2. Essential hypertension, benign  - CMP14+EGFR  3. Chronic pain syndrome Refill - famotidine (PEPCID) 20 MG tablet; Take 1 tablet (20 mg total) by mouth daily.  Dispense: 60 tablet; Refill: 3 - amitriptyline (ELAVIL) 50 MG tablet; Take 1 tablet (50 mg total) by mouth at bedtime.  Dispense: 30 tablet; Refill: 3 - DULoxetine (CYMBALTA) 30 MG capsule; Take 1 capsule (30 mg total) by mouth daily.  Dispense: 30 capsule; Refill: 3  4. Moderate episode of recurrent major depressive disorder (HCC)  - DULoxetine (CYMBALTA) 30 MG capsule; Take 1 capsule (30 mg total) by mouth daily.  Dispense: 30 capsule; Refill: 3  5. Tooth Decay  - Ambulatory Referral to Dentist  6. Colon Cancer Screening  - Ambulatory Referral to Gastroenterology for Colonoscopy  Patient have been counseled extensively about nutrition and exercise. Other issues discussed during this visit include: low cholesterol diet, weight control and daily exercise, foot care, annual eye examinations at Ophthalmology, importance of adherence with medications and regular follow-up. We also discussed long term complications of uncontrolled diabetes and hypertension.   Return in about 6 months (around 04/06/2017) for Follow up Pain and comorbidities, Hemoglobin A1C and Follow up, DM.  The patient was given clear instructions to go to ER or return to medical center if symptoms don't improve, worsen or new problems develop. The patient verbalized understanding. The patient was told to call to get lab results if they haven't heard anything in the next week.   This  note has been created with Surveyor, quantity. Any transcriptional errors are unintentional.    Angelica Chessman, MD, [place], [place], [place], [place] and [facility] [place], [place]   10/05/2016, 10:14 AM

## 2016-10-05 NOTE — Patient Instructions (Signed)
Dental Pain Dental pain may be caused by many things, including:  Tooth decay (cavities or caries). Cavities cause the nerve of your tooth to be open to air and hot or cold temperatures. This can cause pain or discomfort.  Abscess or infection. A dental abscess is an area that is full of infected pus from a bacterial infection in the inner part of the tooth (pulp). It usually happens at the end of the tooth's root.  Injury.  An unknown reason (idiopathic).  Your pain may be mild or severe. It may only happen when:  You are chewing.  You are exposed to hot or cold temperature.  You are eating or drinking sugary foods or beverages, such as: ? Soda. ? Candy.  Your pain may also be there all of the time. Follow these instructions at home: Watch your dental pain for any changes. Do these things to lessen your discomfort:  Take medicines only as told by your dentist.  If your dentist tells you to take an antibiotic medicine, finish all of it even if you start to feel better.  Keep all follow-up visits as told by your dentist. This is important.  Do not apply heat to the outside of your face.  Rinse your mouth or gargle with salt water if told by your dentist. This helps with pain and swelling. ? You can make salt water by adding  tsp of salt to 1 cup of warm water.  Apply ice to the painful area of your face: ? Put ice in a plastic bag. ? Place a towel between your skin and the bag. ? Leave the ice on for 20 minutes, 2-3 times per day.  Avoid foods or drinks that cause you pain, such as: ? Very hot or very cold foods or drinks. ? Sweet or sugary foods or drinks.  Contact a doctor if:  Your pain is not helped with medicines.  Your symptoms are worse.  You have new symptoms. Get help right away if:  You cannot open your mouth.  You are having trouble breathing or swallowing.  You have a fever.  Your face, neck, or jaw is puffy (swollen). This information is not  intended to replace advice given to you by your health care provider. Make sure you discuss any questions you have with your health care provider. Document Released: 09/14/2007 Document Revised: 09/03/2015 Document Reviewed: 03/24/2014 Elsevier Interactive Patient Education  2018 [place]. Blood Glucose Monitoring, Adult Monitoring your blood sugar (glucose) helps you manage your diabetes. It also helps you and your health care provider determine how well your diabetes management plan is working. Blood glucose monitoring involves checking your blood glucose as often as directed, and keeping a record (log) of your results over time. Why should I monitor my blood glucose? Checking your blood glucose regularly can:  Help you understand how food, exercise, illnesses, and medicines affect your blood glucose.  Let you know what your blood glucose is at any time. You can quickly tell if you are having low blood glucose (hypoglycemia) or high blood glucose (hyperglycemia).  Help you and your health care provider adjust your medicines as needed.  When should I check my blood glucose? Follow instructions from your health care provider about how often to check your blood glucose. This may depend on:  The type of diabetes you have.  How well-controlled your diabetes is.  Medicines you are taking.  If you have type 1 diabetes:  Check your blood glucose at  least 2 times a day.  Also check your blood glucose: ? Before every insulin injection. ? Before and after exercise. ? Between meals. ? 2 hours after a meal. ? Occasionally between 2:00 a.m. and 3:00 a.m., as directed. ? Before potentially dangerous tasks, like driving or using heavy machinery. ? At bedtime.  You may need to check your blood glucose more often, up to 6-10 times a day: ? If you use an insulin pump. ? If you need multiple daily injections (MDI). ? If your diabetes is not well-controlled. ? If you are ill. ? If you have  a history of severe hypoglycemia. ? If you have a history of not knowing when your blood glucose is getting low (hypoglycemia unawareness). If you have type 2 diabetes:  If you take insulin or other diabetes medicines, check your blood glucose at least 2 times a day.  If you are on intensive insulin therapy, check your blood glucose at least 4 times a day. Occasionally, you may also need to check between 2:00 a.m. and 3:00 a.m., as directed.  Also check your blood glucose: ? Before and after exercise. ? Before potentially dangerous tasks, like driving or using heavy machinery.  You may need to check your blood glucose more often if: ? Your medicine is being adjusted. ? Your diabetes is not well-controlled. ? You are ill. What is a blood glucose log?  A blood glucose log is a record of your blood glucose readings. It helps you and your health care provider: ? Look for patterns in your blood glucose over time. ? Adjust your diabetes management plan as needed.  Every time you check your blood glucose, write down your result and notes about things that may be affecting your blood glucose, such as your diet and exercise for the day.  Most glucose meters store a record of glucose readings in the meter. Some meters allow you to download your records to a computer. How do I check my blood glucose? Follow these steps to get accurate readings of your blood glucose: Supplies needed   Blood glucose meter.  Test strips for your meter. Each meter has its own strips. You must use the strips that come with your meter.  A needle to prick your finger (lancet). Do not use lancets more than once.  A device that holds the lancet (lancing device).  A journal or log book to write down your results. Procedure  Wash your hands with soap and water.  Prick the side of your finger (not the tip) with the lancet. Use a different finger each time.  Gently rub the finger until a small drop of blood  appears.  Follow instructions that come with your meter for inserting the test strip, applying blood to the strip, and using your blood glucose meter.  Write down your result and any notes. Alternative testing sites  Some meters allow you to use areas of your body other than your finger (alternative sites) to test your blood.  If you think you may have hypoglycemia, or if you have hypoglycemia unawareness, do not use alternative sites. Use your finger instead.  Alternative sites may not be as accurate as the fingers, because blood flow is slower in these areas. This means that the result you get may be delayed, and it may be different from the result that you would get from your finger.  The most common alternative sites are: ? Forearm. ? Thigh. ? Palm of the hand. Additional tips  Always keep your supplies with you.  If you have questions or need help, all blood glucose meters have a 24-hour "hotline" number that you can call. You may also contact your health care provider.  After you use a few boxes of test strips, adjust (calibrate) your blood glucose meter by following instructions that came with your meter. This information is not intended to replace advice given to you by your health care provider. Make sure you discuss any questions you have with your health care provider. Document Released: 03/31/2003 Document Revised: 10/16/2015 Document Reviewed: 09/07/2015 Elsevier Interactive Patient Education  2017 [employer].

## 2016-10-06 LAB — CMP14+EGFR
A/G RATIO: 1.8 (ref 1.2–2.2)
ALT: 9 IU/L (ref 0–32)
AST: 11 IU/L (ref 0–40)
Albumin: 4.5 g/dL (ref 3.5–5.5)
Alkaline Phosphatase: 69 IU/L (ref 39–117)
BUN/Creatinine Ratio: 14 (ref 9–23)
BUN: 15 mg/dL (ref 6–24)
Bilirubin Total: 0.2 mg/dL (ref 0.0–1.2)
CALCIUM: 9.6 mg/dL (ref 8.7–10.2)
CO2: 22 mmol/L (ref 20–29)
Chloride: 105 mmol/L (ref 96–106)
Creatinine, Ser: 1.05 mg/dL — ABNORMAL HIGH (ref 0.57–1.00)
GFR calc Af Amer: 72 mL/min/{1.73_m2} (ref >59)
GFR, EST NON AFRICAN AMERICAN: 62 mL/min/{1.73_m2} (ref >59)
Globulin, Total: 2.5 g/dL (ref 1.5–4.5)
Glucose: 98 mg/dL (ref 65–99)
POTASSIUM: 3.6 mmol/L (ref 3.5–5.2)
Sodium: 142 mmol/L (ref 134–144)
TOTAL PROTEIN: 7 g/dL (ref 6.0–8.5)

## 2016-10-06 LAB — CBC WITH DIFFERENTIAL/PLATELET
BASOS: 2 %
Basophils Absolute: 0.1 10*3/uL (ref 0.0–0.2)
EOS (ABSOLUTE): 0.3 10*3/uL (ref 0.0–0.4)
EOS: 4 %
Hematocrit: 34 % (ref 34.0–46.6)
Hemoglobin: 10.9 g/dL — ABNORMAL LOW (ref 11.1–15.9)
IMMATURE GRANS (ABS): 0 10*3/uL (ref 0.0–0.1)
IMMATURE GRANULOCYTES: 0 %
LYMPHS: 55 %
Lymphocytes Absolute: 3.3 10*3/uL — ABNORMAL HIGH (ref 0.7–3.1)
MCH: 26.7 pg (ref 26.6–33.0)
MCHC: 32.1 g/dL (ref 31.5–35.7)
MCV: 83 fL (ref 79–97)
MONOCYTES: 6 %
Monocytes Absolute: 0.4 10*3/uL (ref 0.1–0.9)
NEUTROS PCT: 33 %
Neutrophils Absolute: 2 10*3/uL (ref 1.4–7.0)
PLATELETS: 226 10*3/uL (ref 150–379)
RBC: 4.08 x10E6/uL (ref 3.77–5.28)
RDW: 16.1 % — ABNORMAL HIGH (ref 12.3–15.4)
WBC: 6 10*3/uL (ref 3.4–10.8)

## 2016-10-06 LAB — LIPID PANEL
CHOL/HDL RATIO: 3.3 ratio (ref 0.0–4.4)
Cholesterol, Total: 163 mg/dL (ref 100–199)
HDL: 49 mg/dL (ref >39)
LDL CALC: 98 mg/dL (ref 0–99)
Triglycerides: 79 mg/dL (ref 0–149)
VLDL CHOLESTEROL CAL: 16 mg/dL (ref 5–40)

## 2016-10-06 NOTE — Telephone Encounter (Signed)
Patient received prescription during visit which was in the medication book at the front desk.

## 2016-10-10 ENCOUNTER — Encounter: Payer: Self-pay | Admitting: Gastroenterology

## 2016-10-26 MED FILL — AMITRIPTYLINE HCL 50 MG TAB: 50 | 30 days supply | Qty: 30 | Fill #0

## 2016-10-26 MED FILL — ?DULOXETINE HCL DR 30 MG CA: 30 MG | 30 days supply | Qty: 30 | Fill #0

## 2016-10-26 MED FILL — FAMOTIDINE 20 MG TABLET: 20 | 30 days supply | Qty: 30 | Fill #0

## 2016-10-26 MED FILL — METFORMIN HCL ER 500 MG TAB: 500 | 90 days supply | Qty: 90 | Fill #0

## 2016-10-28 ENCOUNTER — Other Ambulatory Visit: Payer: Self-pay | Admitting: Internal Medicine

## 2016-10-28 DIAGNOSIS — G894 Chronic pain syndrome: Secondary | ICD-10-CM

## 2016-10-28 MED FILL — ?DULOXETINE HCL DR 30 MG CA: 30 MG | 30 days supply | Qty: 30 | Fill #1

## 2016-10-28 MED FILL — IBUPROFEN 800 MG TABLET: 800 | 20 days supply | Qty: 60 | Fill #0

## 2016-10-31 ENCOUNTER — Telehealth: Payer: Self-pay | Admitting: *Deleted

## 2016-10-31 NOTE — Telephone Encounter (Signed)
-----   Message from Tresa Garter, MD sent at 10/06/2016 12:56 PM EDT ----- Labs are mostly normal, Hemoglobin (blood level) is low but stable

## 2016-10-31 NOTE — Telephone Encounter (Signed)
Patient is aware of blood count a bit low but stable and other blood work being normal. Psychologist, sport and exercise left message on patient's home and cell voicemail. Voicemail states to give a call back to [place] with [facility] at [phone].

## 2016-11-10 MED FILL — ?HYDROCHLOROTHIAZIDE 12.5MG: 12.5 | 30 days supply | Qty: 30 | Fill #2

## 2016-11-14 ENCOUNTER — Encounter: Payer: Self-pay | Admitting: Internal Medicine

## 2016-11-29 ENCOUNTER — Ambulatory Visit (AMBULATORY_SURGERY_CENTER): Payer: Self-pay

## 2016-11-29 VITALS — Ht 64.25 in | Wt 165.0 lb

## 2016-11-29 DIAGNOSIS — Z1211 Encounter for screening for malignant neoplasm of colon: Secondary | ICD-10-CM

## 2016-11-29 NOTE — Progress Notes (Signed)
No allergies to eggs or soy No diet meds No home oxygen No past problems with anesthesia  Registered emmi 

## 2016-12-05 ENCOUNTER — Other Ambulatory Visit: Payer: Self-pay | Admitting: Internal Medicine

## 2016-12-05 DIAGNOSIS — E1142 Type 2 diabetes mellitus with diabetic polyneuropathy: Secondary | ICD-10-CM

## 2016-12-05 DIAGNOSIS — G894 Chronic pain syndrome: Secondary | ICD-10-CM

## 2016-12-05 MED FILL — FAMOTIDINE 20 MG TABLET: 20 | 30 days supply | Qty: 30 | Fill #1

## 2016-12-05 MED FILL — ?DULOXETINE HCL DR 30 MG CA: 30 MG | 30 days supply | Qty: 30 | Fill #2

## 2016-12-06 MED FILL — IBUPROFEN 800 MG TABLET: 800 | 20 days supply | Qty: 60 | Fill #0

## 2016-12-06 MED FILL — GABAPENTIN 300 MG CAPSULE: 300 | 30 days supply | Qty: 180 | Fill #0

## 2016-12-13 ENCOUNTER — Ambulatory Visit (AMBULATORY_SURGERY_CENTER): Payer: Self-pay | Admitting: Gastroenterology

## 2016-12-13 ENCOUNTER — Encounter: Payer: Self-pay | Admitting: Gastroenterology

## 2016-12-13 VITALS — BP 114/94 | HR 87 | Temp 98.4°F | Resp 18 | Ht 63.0 in | Wt 171.0 lb

## 2016-12-13 DIAGNOSIS — Z1212 Encounter for screening for malignant neoplasm of rectum: Secondary | ICD-10-CM

## 2016-12-13 DIAGNOSIS — Z1211 Encounter for screening for malignant neoplasm of colon: Secondary | ICD-10-CM

## 2016-12-13 MED ORDER — SODIUM CHLORIDE 0.9 % IV SOLN: 500.0000 mL | INTRAVENOUS | Status: DC

## 2016-12-13 NOTE — Op Note (Signed)
[facility] Patient Name: Kendra Griffith Procedure Date: 12/13/2016 11:22 AM MRN: [id number] Endoscopist: Mallie Mussel L. Loletha Carrow , MD Age: 51 Referring MD:  Date of Birth: 08-01-1965 Gender: Female Account #: [id number] Procedure:                Colonoscopy Indications:              Screening for colorectal malignant neoplasm, This                            is the patient's first colonoscopy Medicines:                Monitored Anesthesia Care Procedure:                Pre-Anesthesia Assessment:                           - Prior to the procedure, a History and Physical                            was performed, and patient medications and                            allergies were reviewed. The patient's tolerance of                            previous anesthesia was also reviewed. The risks                            and benefits of the procedure and the sedation                            options and risks were discussed with the patient.                            All questions were answered, and informed consent                            was obtained. Prior Anticoagulants: The patient has                            taken no previous anticoagulant or antiplatelet                            agents. ASA Grade Assessment: II - A patient with                            mild systemic disease. After reviewing the risks                            and benefits, the patient was deemed in                            satisfactory condition to undergo the procedure.  After obtaining informed consent, the colonoscope                            was passed under direct vision. Throughout the                            procedure, the patient's blood pressure, pulse, and                            oxygen saturations were monitored continuously. The                            Model CF-HQ190L [id number]) scope was introduced                            through the anus and  advanced to the the cecum,                            identified by appendiceal orifice and ileocecal                            valve. The colonoscopy was performed without                            difficulty. The patient tolerated the procedure                            well. The quality of the bowel preparation was                            good. The ileocecal valve, appendiceal orifice, and                            rectum were photographed. The quality of the bowel                            preparation was evaluated using the BBPS [facility]                            Bowel Preparation Scale) with scores of: Right                            Colon = 2, Transverse Colon = 2 and Left Colon = 2.                            The total BBPS score equals 6. The bowel                            preparation used was SUPREP. Scope In: 11:27:39 AM Scope Out: 11:40:59 AM Scope Withdrawal Time: 0 hours 7 minutes 49 seconds  Total Procedure Duration: 0 hours 13 minutes 20 seconds  Findings:                 The perianal  and digital rectal examinations were                            normal.                           Multiple diverticula were found in the left colon                            and right colon.                           The exam was otherwise without abnormality on                            direct and retroflexion views. Complications:            No immediate complications. Estimated Blood Loss:     Estimated blood loss: none. Impression:               - Diverticulosis in the left colon and in the right                            colon.                           - The examination was otherwise normal on direct                            and retroflexion views.                           - No specimens collected. Recommendation:           - Patient has a contact number available for                            emergencies. The signs and symptoms of potential                             delayed complications were discussed with the                            patient. Return to normal activities tomorrow.                            Written discharge instructions were provided to the                            patient.                           - Resume previous diet.                           - Continue present medications.                           -  Repeat colonoscopy in 10 years for screening                            purposes. Lorynn Moeser L. Loletha Carrow, MD 12/13/2016 11:48:11 AM This report has been signed electronically.

## 2016-12-13 NOTE — Patient Instructions (Signed)
Impression/Recomendations:  Diverticulosis handout given to patient.  Resume previous diet. Continue present medications.  Repeat colonoscopy in 10 years for screening.  YOU HAD AN ENDOSCOPIC PROCEDURE TODAY AT [facility]:   Refer to the procedure report that was given to you for any specific questions about what was found during the examination.  If the procedure report does not answer your questions, please call your gastroenterologist to clarify.  If you requested that your care partner not be given the details of your procedure findings, then the procedure report has been included in a sealed envelope for you to review at your convenience later.  YOU SHOULD EXPECT: Some feelings of bloating in the abdomen. Passage of more gas than usual.  Walking can help get rid of the air that was put into your GI tract during the procedure and reduce the bloating. If you had a lower endoscopy (such as a colonoscopy or flexible sigmoidoscopy) you may notice spotting of blood in your stool or on the toilet paper. If you underwent a bowel prep for your procedure, you may not have a normal bowel movement for a few days.  Please Note:  You might notice some irritation and congestion in your nose or some drainage.  This is from the oxygen used during your procedure.  There is no need for concern and it should clear up in a day or so.  SYMPTOMS TO REPORT IMMEDIATELY:   Following lower endoscopy (colonoscopy or flexible sigmoidoscopy):  Excessive amounts of blood in the stool  Significant tenderness or worsening of abdominal pains  Swelling of the abdomen that is new, acute  Fever of 100F or higher For urgent or emergent issues, a gastroenterologist can be reached at any hour by calling [phone].   DIET:  We do recommend a small meal at first, but then you may proceed to your regular diet.  Drink plenty of fluids but you should avoid alcoholic beverages for 24 hours.  ACTIVITY:   You should plan to take it easy for the rest of today and you should NOT DRIVE or use heavy machinery until tomorrow (because of the sedation medicines used during the test).    FOLLOW UP: Our staff will call the number listed on your records the next business day following your procedure to check on you and address any questions or concerns that you may have regarding the information given to you following your procedure. If we do not reach you, we will leave a message.  However, if you are feeling well and you are not experiencing any problems, there is no need to return our call.  We will assume that you have returned to your regular daily activities without incident.  If any biopsies were taken you will be contacted by phone or by letter within the next 1-3 weeks.  Please call us at [phone] if you have not heard about the biopsies in 3 weeks.    SIGNATURES/CONFIDENTIALITY: You and/or your care partner have signed paperwork which will be entered into your electronic medical record.  These signatures attest to the fact that that the information above on your After Visit Summary has been reviewed and is understood.  Full responsibility of the confidentiality of this discharge information lies with you and/or your care-partner.

## 2016-12-13 NOTE — Progress Notes (Signed)
Report given to PACU, vss 

## 2016-12-13 NOTE — Progress Notes (Signed)
Pt. Arrived on unit with bottle of water drinking,pt. Explained to pt. That she needed to be npo at least 3 hrs prior to procedure and explained why,she verbalize understanding,made CRNA aware and he informed that pt. Will be done at 11:30,made pt. Aware of Crna decision and she stated that she would wait in waiting room, made sure that she knew not to drink any thing else.

## 2016-12-14 ENCOUNTER — Telehealth: Payer: Self-pay | Admitting: *Deleted

## 2016-12-14 ENCOUNTER — Telehealth: Payer: Self-pay

## 2016-12-14 NOTE — Telephone Encounter (Signed)
Left message on answering machine. 

## 2016-12-14 NOTE — Telephone Encounter (Signed)
No answer, message left for the patient. 

## 2017-01-18 ENCOUNTER — Ambulatory Visit: Payer: Self-pay | Attending: Internal Medicine | Admitting: Internal Medicine

## 2017-01-18 ENCOUNTER — Encounter: Payer: Self-pay | Admitting: Internal Medicine

## 2017-01-18 ENCOUNTER — Ambulatory Visit: Payer: Self-pay | Admitting: Licensed Clinical Social Worker

## 2017-01-18 VITALS — BP 158/107 | HR 89 | Temp 98.2°F | Resp 16 | Wt 164.4 lb

## 2017-01-18 DIAGNOSIS — F331 Major depressive disorder, recurrent, moderate: Secondary | ICD-10-CM

## 2017-01-18 DIAGNOSIS — K089 Disorder of teeth and supporting structures, unspecified: Secondary | ICD-10-CM

## 2017-01-18 DIAGNOSIS — I10 Essential (primary) hypertension: Secondary | ICD-10-CM | POA: Insufficient documentation

## 2017-01-18 DIAGNOSIS — Z79899 Other long term (current) drug therapy: Secondary | ICD-10-CM | POA: Insufficient documentation

## 2017-01-18 DIAGNOSIS — M25572 Pain in left ankle and joints of left foot: Secondary | ICD-10-CM | POA: Insufficient documentation

## 2017-01-18 DIAGNOSIS — Z7984 Long term (current) use of oral hypoglycemic drugs: Secondary | ICD-10-CM | POA: Insufficient documentation

## 2017-01-18 DIAGNOSIS — M79672 Pain in left foot: Secondary | ICD-10-CM

## 2017-01-18 DIAGNOSIS — F17210 Nicotine dependence, cigarettes, uncomplicated: Secondary | ICD-10-CM | POA: Insufficient documentation

## 2017-01-18 DIAGNOSIS — M79671 Pain in right foot: Secondary | ICD-10-CM

## 2017-01-18 DIAGNOSIS — F419 Anxiety disorder, unspecified: Secondary | ICD-10-CM | POA: Insufficient documentation

## 2017-01-18 DIAGNOSIS — E119 Type 2 diabetes mellitus without complications: Secondary | ICD-10-CM | POA: Insufficient documentation

## 2017-01-18 DIAGNOSIS — G894 Chronic pain syndrome: Secondary | ICD-10-CM | POA: Insufficient documentation

## 2017-01-18 DIAGNOSIS — M25571 Pain in right ankle and joints of right foot: Secondary | ICD-10-CM | POA: Insufficient documentation

## 2017-01-18 DIAGNOSIS — F329 Major depressive disorder, single episode, unspecified: Secondary | ICD-10-CM | POA: Insufficient documentation

## 2017-01-18 LAB — GLUCOSE, POCT (MANUAL RESULT ENTRY): POC GLUCOSE: 108 mg/dL — AB (ref 70–99)

## 2017-01-18 MED ORDER — HYDROCHLOROTHIAZIDE 25 MG PO TABS: 25.0000 mg | ORAL_TABLET | Freq: Every day | ORAL | 3 refills | Status: DC

## 2017-01-18 MED ORDER — HYDROXYZINE HCL 25 MG PO TABS: 25.0000 mg | ORAL_TABLET | Freq: Three times a day (TID) | ORAL | 3 refills | Status: DC | PRN

## 2017-01-18 MED ORDER — METFORMIN HCL ER 500 MG PO TB24: 500.0000 mg | ORAL_TABLET | Freq: Every day | ORAL | 3 refills | Status: DC

## 2017-01-18 MED ORDER — GABAPENTIN 300 MG PO CAPS: ORAL_CAPSULE | ORAL | 3 refills | Status: DC

## 2017-01-18 MED ORDER — DULOXETINE HCL 30 MG PO CPEP: 30.0000 mg | ORAL_CAPSULE | Freq: Every day | ORAL | 3 refills | Status: DC

## 2017-01-18 NOTE — Patient Instructions (Signed)
DASH Eating Plan DASH stands for "Dietary Approaches to Stop Hypertension." The DASH eating plan is a healthy eating plan that has been shown to reduce high blood pressure (hypertension). It may also reduce your risk for type 2 diabetes, heart disease, and stroke. The DASH eating plan may also help with weight loss. What are tips for following this plan? General guidelines  Avoid eating more than 2,300 mg (milligrams) of salt (sodium) a day. If you have hypertension, you may need to reduce your sodium intake to 1,500 mg a day.  Limit alcohol intake to no more than 1 drink a day for nonpregnant women and 2 drinks a day for men. One drink equals 12 oz of beer, 5 oz of wine, or 1 oz of hard liquor.  Work with your health care provider to maintain a healthy body weight or to lose weight. Ask what an ideal weight is for you.  Get at least 30 minutes of exercise that causes your heart to beat faster (aerobic exercise) most days of the week. Activities may include walking, swimming, or biking.  Work with your health care provider or diet and nutrition specialist (dietitian) to adjust your eating plan to your individual calorie needs. Reading food labels  Check food labels for the amount of sodium per serving. Choose foods with less than 5 percent of the Daily Value of sodium. Generally, foods with less than 300 mg of sodium per serving fit into this eating plan.  To find whole grains, look for the word "whole" as the first word in the ingredient list. Shopping  Buy products labeled as "low-sodium" or "no salt added."  Buy fresh foods. Avoid canned foods and premade or frozen meals. Cooking  Avoid adding salt when cooking. Use salt-free seasonings or herbs instead of table salt or sea salt. Check with your health care provider or pharmacist before using salt substitutes.  Do not fry foods. Cook foods using healthy methods such as baking, boiling, grilling, and broiling instead.  Cook with  heart-healthy oils, such as olive, canola, soybean, or sunflower oil. Meal planning   Eat a balanced diet that includes: ? 5 or more servings of fruits and vegetables each day. At each meal, try to fill half of your plate with fruits and vegetables. ? Up to 6-8 servings of whole grains each day. ? Less than 6 oz of lean meat, poultry, or fish each day. A 3-oz serving of meat is about the same size as a deck of cards. One egg equals 1 oz. ? 2 servings of low-fat dairy each day. ? A serving of nuts, seeds, or beans 5 times each week. ? Heart-healthy fats. Healthy fats called Omega-3 fatty acids are found in foods such as flaxseeds and coldwater fish, like sardines, salmon, and mackerel.  Limit how much you eat of the following: ? Canned or prepackaged foods. ? Food that is high in trans fat, such as fried foods. ? Food that is high in saturated fat, such as fatty meat. ? Sweets, desserts, sugary drinks, and other foods with added sugar. ? Full-fat dairy products.  Do not salt foods before eating.  Try to eat at least 2 vegetarian meals each week.  Eat more home-cooked food and less restaurant, buffet, and fast food.  When eating at a restaurant, ask that your food be prepared with less salt or no salt, if possible. What foods are recommended? The items listed may not be a complete list. Talk with your dietitian about what   dietary choices are best for you. Grains Whole-grain or whole-wheat bread. Whole-grain or whole-wheat pasta. Brown rice. Oatmeal. Quinoa. Bulgur. Whole-grain and low-sodium cereals. Pita bread. Low-fat, low-sodium crackers. Whole-wheat flour tortillas. Vegetables Fresh or frozen vegetables (raw, steamed, roasted, or grilled). Low-sodium or reduced-sodium tomato and vegetable juice. Low-sodium or reduced-sodium tomato sauce and tomato paste. Low-sodium or reduced-sodium canned vegetables. Fruits All fresh, dried, or frozen fruit. Canned fruit in natural juice (without  added sugar). Meat and other protein foods Skinless chicken or turkey. Ground chicken or turkey. Pork with fat trimmed off. Fish and seafood. Egg whites. Dried beans, peas, or lentils. Unsalted nuts, nut butters, and seeds. Unsalted canned beans. Lean cuts of beef with fat trimmed off. Low-sodium, lean deli meat. Dairy Low-fat (1%) or fat-free (skim) milk. Fat-free, low-fat, or reduced-fat cheeses. Nonfat, low-sodium ricotta or cottage cheese. Low-fat or nonfat yogurt. Low-fat, low-sodium cheese. Fats and oils Soft margarine without trans fats. Vegetable oil. Low-fat, reduced-fat, or light mayonnaise and salad dressings (reduced-sodium). Canola, safflower, olive, soybean, and sunflower oils. Avocado. Seasoning and other foods Herbs. Spices. Seasoning mixes without salt. Unsalted popcorn and pretzels. Fat-free sweets. What foods are not recommended? The items listed may not be a complete list. Talk with your dietitian about what dietary choices are best for you. Grains Baked goods made with fat, such as croissants, muffins, or some breads. Dry pasta or rice meal packs. Vegetables Creamed or fried vegetables. Vegetables in a cheese sauce. Regular canned vegetables (not low-sodium or reduced-sodium). Regular canned tomato sauce and paste (not low-sodium or reduced-sodium). Regular tomato and vegetable juice (not low-sodium or reduced-sodium). Pickles. Olives. Fruits Canned fruit in a light or heavy syrup. Fried fruit. Fruit in cream or butter sauce. Meat and other protein foods Fatty cuts of meat. Ribs. Fried meat. Bacon. Sausage. Bologna and other processed lunch meats. Salami. Fatback. Hotdogs. Bratwurst. Salted nuts and seeds. Canned beans with added salt. Canned or smoked fish. Whole eggs or egg yolks. Chicken or turkey with skin. Dairy Whole or 2% milk, cream, and half-and-half. Whole or full-fat cream cheese. Whole-fat or sweetened yogurt. Full-fat cheese. Nondairy creamers. Whipped toppings.  Processed cheese and cheese spreads. Fats and oils Butter. Stick margarine. Lard. Shortening. Ghee. Bacon fat. Tropical oils, such as coconut, palm kernel, or palm oil. Seasoning and other foods Salted popcorn and pretzels. Onion salt, garlic salt, seasoned salt, table salt, and sea salt. Worcestershire sauce. Tartar sauce. Barbecue sauce. Teriyaki sauce. Soy sauce, including reduced-sodium. Steak sauce. Canned and packaged gravies. Fish sauce. Oyster sauce. Cocktail sauce. Horseradish that you find on the shelf. Ketchup. Mustard. Meat flavorings and tenderizers. Bouillon cubes. Hot sauce and Tabasco sauce. Premade or packaged marinades. Premade or packaged taco seasonings. Relishes. Regular salad dressings. Where to find more information:  National Heart, Lung, and Blood Institute: www.nhlbi.nih.gov  American Heart Association: www.heart.org Summary  The DASH eating plan is a healthy eating plan that has been shown to reduce high blood pressure (hypertension). It may also reduce your risk for type 2 diabetes, heart disease, and stroke.  With the DASH eating plan, you should limit salt (sodium) intake to 2,300 mg a day. If you have hypertension, you may need to reduce your sodium intake to 1,500 mg a day.  When on the DASH eating plan, aim to eat more fresh fruits and vegetables, whole grains, lean proteins, low-fat dairy, and heart-healthy fats.  Work with your health care provider or diet and nutrition specialist (dietitian) to adjust your eating plan to your individual   calorie needs. This information is not intended to replace advice given to you by your health care provider. Make sure you discuss any questions you have with your health care provider. Document Released: 03/17/2011 Document Revised: 03/21/2016 Document Reviewed: 03/21/2016 Elsevier Interactive Patient Education  2017 [employer] Hypertension Hypertension, commonly called high blood pressure, is when the force of blood  pumping through the arteries is too strong. The arteries are the blood vessels that carry blood from the heart throughout the body. Hypertension forces the heart to work harder to pump blood and may cause arteries to become narrow or stiff. Having untreated or uncontrolled hypertension can cause heart attacks, strokes, kidney disease, and other problems. A blood pressure reading consists of a higher number over a lower number. Ideally, your blood pressure should be below 120/80. The first ("top") number is called the systolic pressure. It is a measure of the pressure in your arteries as your heart beats. The second ("bottom") number is called the diastolic pressure. It is a measure of the pressure in your arteries as the heart relaxes. What are the causes? The cause of this condition is not known. What increases the risk? Some risk factors for high blood pressure are under your control. Others are not. Factors you can change  Smoking.  Having type 2 diabetes mellitus, high cholesterol, or both.  Not getting enough exercise or physical activity.  Being overweight.  Having too much fat, sugar, calories, or salt (sodium) in your diet.  Drinking too much alcohol. Factors that are difficult or impossible to change  Having chronic kidney disease.  Having a family history of high blood pressure.  Age. Risk increases with age.  Race. You may be at higher risk if you are African-American.  Gender. Men are at higher risk than women before age 45. After age 65, women are at higher risk than men.  Having obstructive sleep apnea.  Stress. What are the signs or symptoms? Extremely high blood pressure (hypertensive crisis) may cause:  Headache.  Anxiety.  Shortness of breath.  Nosebleed.  Nausea and vomiting.  Severe chest pain.  Jerky movements you cannot control (seizures).  How is this diagnosed? This condition is diagnosed by measuring your blood pressure while you are  seated, with your arm resting on a surface. The cuff of the blood pressure monitor will be placed directly against the skin of your upper arm at the level of your heart. It should be measured at least twice using the same arm. Certain conditions can cause a difference in blood pressure between your right and left arms. Certain factors can cause blood pressure readings to be lower or higher than normal (elevated) for a short period of time:  When your blood pressure is higher when you are in a health care provider's office than when you are at home, this is called white coat hypertension. Most people with this condition do not need medicines.  When your blood pressure is higher at home than when you are in a health care provider's office, this is called masked hypertension. Most people with this condition may need medicines to control blood pressure.  If you have a high blood pressure reading during one visit or you have normal blood pressure with other risk factors:  You may be asked to return on a different day to have your blood pressure checked again.  You may be asked to monitor your blood pressure at home for 1 week or longer.  If you are diagnosed   with hypertension, you may have other blood or imaging tests to help your health care provider understand your overall risk for other conditions. How is this treated? This condition is treated by making healthy lifestyle changes, such as eating healthy foods, exercising more, and reducing your alcohol intake. Your health care provider may prescribe medicine if lifestyle changes are not enough to get your blood pressure under control, and if:  Your systolic blood pressure is above 130.  Your diastolic blood pressure is above 80.  Your personal target blood pressure may vary depending on your medical conditions, your age, and other factors. Follow these instructions at home: Eating and drinking  Eat a diet that is high in fiber and potassium,  and low in sodium, added sugar, and fat. An example eating plan is called the DASH (Dietary Approaches to Stop Hypertension) diet. To eat this way: ? Eat plenty of fresh fruits and vegetables. Try to fill half of your plate at each meal with fruits and vegetables. ? Eat whole grains, such as whole wheat pasta, brown rice, or whole grain bread. Fill about one quarter of your plate with whole grains. ? Eat or drink low-fat dairy products, such as skim milk or low-fat yogurt. ? Avoid fatty cuts of meat, processed or cured meats, and poultry with skin. Fill about one quarter of your plate with lean proteins, such as fish, chicken without skin, beans, eggs, and tofu. ? Avoid premade and processed foods. These tend to be higher in sodium, added sugar, and fat.  Reduce your daily sodium intake. Most people with hypertension should eat less than 1,500 mg of sodium a day.  Limit alcohol intake to no more than 1 drink a day for nonpregnant women and 2 drinks a day for men. One drink equals 12 oz of beer, 5 oz of wine, or 1 oz of hard liquor. Lifestyle  Work with your health care provider to maintain a healthy body weight or to lose weight. Ask what an ideal weight is for you.  Get at least 30 minutes of exercise that causes your heart to beat faster (aerobic exercise) most days of the week. Activities may include walking, swimming, or biking.  Include exercise to strengthen your muscles (resistance exercise), such as pilates or lifting weights, as part of your weekly exercise routine. Try to do these types of exercises for 30 minutes at least 3 days a week.  Do not use any products that contain nicotine or tobacco, such as cigarettes and e-cigarettes. If you need help quitting, ask your health care provider.  Monitor your blood pressure at home as told by your health care provider.  Keep all follow-up visits as told by your health care provider. This is important. Medicines  Take over-the-counter and  prescription medicines only as told by your health care provider. Follow directions carefully. Blood pressure medicines must be taken as prescribed.  Do not skip doses of blood pressure medicine. Doing this puts you at risk for problems and can make the medicine less effective.  Ask your health care provider about side effects or reactions to medicines that you should watch for. Contact a health care provider if:  You think you are having a reaction to a medicine you are taking.  You have headaches that keep coming back (recurring).  You feel dizzy.  You have swelling in your ankles.  You have trouble with your vision. Get help right away if:  You develop a severe headache or confusion.  You   have unusual weakness or numbness.  You feel faint.  You have severe pain in your chest or abdomen.  You vomit repeatedly.  You have trouble breathing. Summary  Hypertension is when the force of blood pumping through your arteries is too strong. If this condition is not controlled, it may put you at risk for serious complications.  Your personal target blood pressure may vary depending on your medical conditions, your age, and other factors. For most people, a normal blood pressure is less than 120/80.  Hypertension is treated with lifestyle changes, medicines, or a combination of both. Lifestyle changes include weight loss, eating a healthy, low-sodium diet, exercising more, and limiting alcohol. This information is not intended to replace advice given to you by your health care provider. Make sure you discuss any questions you have with your health care provider. Document Released: 03/28/2005 Document Revised: 02/24/2016 Document Reviewed: 02/24/2016 Elsevier Interactive Patient Education  2018 [employer]. Major Depressive Disorder, Adult Major depressive disorder (MDD) is a mental health condition. MDD often makes you feel sad, hopeless, or helpless. MDD can also cause symptoms in  your body. MDD can affect your:  Work.  School.  Relationships.  Other normal activities.  MDD can range from mild to very bad. It may occur once (single episode MDD). It can also occur many times (recurrent MDD). The main symptoms of MDD often include:  Feeling sad, depressed, or irritable most of the time.  Loss of interest.  MDD symptoms also include:  Sleeping too much or too little.  Eating too much or too little.  A change in your weight.  Feeling tired (fatigue) or having low energy.  Feeling worthless.  Feeling guilty.  Trouble making decisions.  Trouble thinking clearly.  Thoughts of suicide or harming others.  Feeling weak.  Feeling agitated.  Keeping yourself from being around other people (isolation).  Follow these instructions at home: Activity  Do these things as told by your doctor: ? Go back to your normal activities. ? Exercise regularly. ? Spend time outdoors. Alcohol  Talk with your doctor about how alcohol can affect your antidepressant medicines.  Do not drink alcohol. Or, limit how much alcohol you drink. ? This means no more than 1 drink a day for nonpregnant women and 2 drinks a day for men. One drink equals one of these:  12 oz of beer.  5 oz of wine.  1 oz of hard liquor. General instructions  Take over-the-counter and prescription medicines only as told by your doctor.  Eat a healthy diet.  Get plenty of sleep.  Find activities that you enjoy. Make time to do them.  Think about joining a support group. Your doctor may be able to suggest a group for you.  Keep all follow-up visits as told by your doctor. This is important. Where to find more information:  [employer] on Mental Illness: ? www.nami.[place]: ? https://carter.com/  National Suicide Prevention Lifeline: ? 678-096-5503. This is free, 24-hour help. Contact a doctor if:  Your symptoms get worse.  You  have new symptoms. Get help right away if:  You self-harm.  You see, hear, taste, smell, or feel things that are not present (hallucinate). If you ever feel like you may hurt yourself or others, or have thoughts about taking your own life, get help right away. You can go to your nearest emergency department or call:  Your local emergency services (911 in the [place]  A  suicide crisis helpline, such as the National Suicide Prevention Lifeline: ? 980-471-4120. This is open 24 hours a day.  This information is not intended to replace advice given to you by your health care provider. Make sure you discuss any questions you have with your health care provider. Document Released: 03/09/2015 Document Revised: 12/13/2015 Document Reviewed: 12/13/2015 Elsevier Interactive Patient Education  2017 [employer].

## 2017-01-18 NOTE — Progress Notes (Signed)
Pt states she feels like she has been lied to about her feet about being able to work

## 2017-01-18 NOTE — Progress Notes (Signed)
Subjective:  Patient ID: Kendra Griffith, female    DOB: 1966/01/21  Age: 51 y.o. MRN: [id number]  CC: Foot Pain  HPI Kendra Griffith is a 51 y.o. female with history of hypertension, prediabetes, chronic pain syndrome, anxiety and depression that presents today for compliants of foot pain and hallucinations. She admits to both visual and auditory hallucinations that started 3 months ago. She says she lives at home alone but sees shadows and hears doors slamming. She is very tearful and admits to depression. She denies drug or alcohol use but continues to smoke cigarettes. She denies SOB, fevers, and chest pain at this time.   She admits to decreased sensation, numbness, and burning of her bilateral feet. She has not had a diabetic foot exam and has been taking over the counter ibproufen every 4 hours. She continues to complain of tooth pain but canceled her previous dentist appointment because she had been scheduled for a colonoscopy that same week.    Past Medical History:  Diagnosis Date  . Anemia   . Anxiety   . Blood transfusion without reported diagnosis    childbirth  . Clotting disorder ([facility])    childbirth  . Depression   . Hypertension   . Injury    right side from jumping off bunkbed  . Pre-diabetes    Past Surgical History:  Procedure Laterality Date  . CESAREAN SECTION    . cyst removal 1997    . FACIAL RECONSTRUCTION SURGERY    . FOOT SURGERY Bilateral    Outpatient Medications Prior to Visit  Medication Sig Dispense Refill  . acetaminophen-codeine (TYLENOL #3) 300-30 MG tablet Take 1 tablet by mouth every 4 (four) hours as needed. (Patient not taking: Reported on 12/13/2016) 60 tablet 0  . amitriptyline (ELAVIL) 50 MG tablet Take 1 tablet (50 mg total) by mouth at bedtime. 30 tablet 3  . Biotin (BIOTIN MAXIMUM STRENGTH) 10 MG TABS Take 5,000 mg by mouth daily.     . diclofenac (VOLTAREN) 75 MG EC tablet Take 1 tablet (75 mg total) by mouth 2 (two) times daily. 30  tablet 0  . famotidine (PEPCID) 20 MG tablet Take 1 tablet (20 mg total) by mouth daily. 60 tablet 3  . ibuprofen (ADVIL,MOTRIN) 800 MG tablet TAKE 1 TABLET BY MOUTH EVERY 8 HOURS AS NEEDED. 60 tablet 0  . DULoxetine (CYMBALTA) 30 MG capsule Take 1 capsule (30 mg total) by mouth daily. 30 capsule 3  . gabapentin (NEURONTIN) 300 MG capsule TAKE 2 CAPSULES BY MOUTH 3 TIMES DAILY. 180 capsule 3  . hydrochlorothiazide (HYDRODIURIL) 12.5 MG tablet Take 1 tablet (12.5 mg total) by mouth daily. 90 tablet 3  . hydrOXYzine (ATARAX/VISTARIL) 25 MG tablet Take 1 tablet (25 mg total) by mouth 3 (three) times daily as needed. 60 tablet 3  . metFORMIN (GLUCOPHAGE-XR) 500 MG 24 hr tablet Take 1 tablet (500 mg total) by mouth daily with breakfast. 90 tablet 3  . 0.9 %  sodium chloride infusion      No facility-administered medications prior to visit.    ROS Review of Systems  Constitutional: Negative for appetite change.  HENT: Negative.   Eyes: Negative for visual disturbance.  Respiratory: Negative for cough and shortness of breath.   Cardiovascular: Negative for chest pain.  Gastrointestinal: Negative for abdominal pain.  Neurological: Positive for numbness and headaches.       Numbness of bilateral feet.  Psychiatric/Behavioral: Positive for hallucinations. The patient is nervous/anxious.  Tearful    Objective:  BP (!) 158/107   Pulse 89   Temp 98.2 F (36.8 C) (Oral)   Resp 16   Wt 164 lb 6.4 oz (74.6 kg)   SpO2 99%   BMI 29.12 kg/m   BP/Weight 01/18/2017 12/13/2016 11/29/2016  Systolic BP 158 114 -  Diastolic BP 107 94 -  Wt. (Lbs) 164.4 171 165  BMI 29.12 30.29 28.1  Some encounter information is confidential and restricted. Go to Review Flowsheets activity to see all data.   Physical Exam  Constitutional: She is oriented to person, place, and time. No distress.  HENT:  Very poor dental hygiene  Cardiovascular: Normal rate, regular rhythm and normal heart sounds.     Pulmonary/Chest: Effort normal and breath sounds normal. No respiratory distress.  Abdominal: Soft. There is tenderness.  Musculoskeletal: Normal range of motion. She exhibits tenderness. She exhibits no edema.  Bilateral feet are tender to palpation   Neurological: She is alert and oriented to person, place, and time.  Psychiatric: Her speech is delayed. She exhibits a depressed mood. She expresses no suicidal plans.   Assessment & Plan:   1. Moderate episode of recurrent major depressive disorder [facility]) Social worker [place] spoke with patient and discussed going to [place] for her multiple pysch symptoms. Patient says she will consider going.  - hydrOXYzine (ATARAX/VISTARIL) 25 MG tablet; Take 1 tablet (25 mg total) by mouth 3 (three) times daily as needed.  Dispense: 60 tablet; Refill: 3 - DULoxetine (CYMBALTA) 30 MG capsule; Take 1 capsule (30 mg total) by mouth daily.  Dispense: 30 capsule; Refill: 3  2. Type 2 diabetes mellitus without complication, without long-term current use of insulin (HCC) - POCT glucose (108) - metFORMIN (GLUCOPHAGE-XR) 500 MG 24 hr tablet; Take 1 tablet (500 mg total) by mouth daily with breakfast.  Dispense: 90 tablet; Refill: 3 - gabapentin (NEURONTIN) 300 MG capsule; TAKE 2 CAPSULES BY MOUTH 3 TIMES DAILY.  Dispense: 180 capsule; Refill: 3 - CMP14+EGFR  3. Essential hypertension Uncontrolled HCTZ increased to 25mg  daily from 12.5 mg daily - hydrochlorothiazide (HYDRODIURIL) 25 MG tablet; Take 1 tablet (25 mg total) by mouth daily.  Dispense: 90 tablet; Refill: 3  4. Chronic pain syndrome - DULoxetine (CYMBALTA) 30 MG capsule; Take 1 capsule (30 mg total) by mouth daily.  Dispense: 30 capsule; Refill: 3  5. Poor dentition - Ambulatory referral to Dentistry  6. Foot pain, bilateral - Ambulatory referral to Podiatry   Meds ordered this encounter  Medications  . metFORMIN (GLUCOPHAGE-XR) 500 MG 24 hr tablet    Sig: Take 1 tablet (500 mg total) by  mouth daily with breakfast.    Dispense:  90 tablet    Refill:  3  . hydrOXYzine (ATARAX/VISTARIL) 25 MG tablet    Sig: Take 1 tablet (25 mg total) by mouth 3 (three) times daily as needed.    Dispense:  60 tablet    Refill:  3  . hydrochlorothiazide (HYDRODIURIL) 25 MG tablet    Sig: Take 1 tablet (25 mg total) by mouth daily.    Dispense:  90 tablet    Refill:  3  . DULoxetine (CYMBALTA) 30 MG capsule    Sig: Take 1 capsule (30 mg total) by mouth daily.    Dispense:  30 capsule    Refill:  3  . gabapentin (NEURONTIN) 300 MG capsule    Sig: TAKE 2 CAPSULES BY MOUTH 3 TIMES DAILY.    Dispense:  180 capsule  Refill:  3   Follow-up: Return in about 3 months (around 04/20/2017) for Follow up HTN, Depression and Anxiety.   Evaluation and management procedures were performed by me with DNP Student in attendance, note written by DNP student under my supervision and collaboration. I have reviewed the note and I agree with the management and plan.   Angelica Chessman, MD, [place], [place], [place], [place], [place]   01/19/2017, 11:40 AM

## 2017-01-19 LAB — CMP14+EGFR
A/G RATIO: 1.7 (ref 1.2–2.2)
ALBUMIN: 4.7 g/dL (ref 3.5–5.5)
ALK PHOS: 90 IU/L (ref 39–117)
ALT: 9 IU/L (ref 0–32)
AST: 15 IU/L (ref 0–40)
BUN / CREAT RATIO: 14 (ref 9–23)
BUN: 13 mg/dL (ref 6–24)
CHLORIDE: 103 mmol/L (ref 96–106)
CO2: 24 mmol/L (ref 20–29)
Calcium: 10.2 mg/dL (ref 8.7–10.2)
Creatinine, Ser: 0.95 mg/dL (ref 0.57–1.00)
GFR calc Af Amer: 80 mL/min/{1.73_m2} (ref >59)
GFR calc non Af Amer: 70 mL/min/{1.73_m2} (ref >59)
GLUCOSE: 98 mg/dL (ref 65–99)
Globulin, Total: 2.7 g/dL (ref 1.5–4.5)
POTASSIUM: 3.9 mmol/L (ref 3.5–5.2)
Sodium: 143 mmol/L (ref 134–144)
Total Protein: 7.4 g/dL (ref 6.0–8.5)

## 2017-01-19 NOTE — BH Specialist Note (Signed)
Integrated Behavioral Health Follow Up Visit  MRN: [id number] Name: Kendra Griffith  Number of [place] Clinician visits: 2/6 Session Start time: 2:20 PM  Session End time: 2:50 PM Total time: 30 minutes  Type of Service: [place] Interpretor:No. Interpretor Name and Language: N/A  SUBJECTIVE: Kendra Griffith is a 51 y.o. female accompanied by self Patient was referred by Dr. Doreene Burke for depression and anxiety. Patient reports the following symptoms/concerns: overwhelming feelings of sadness and worry, chronic pain in both feet, memory loss, difficulty focusing, low motivation, decreased sleep, audio/visual hallucinations, and hx of suicidal ideations Duration of problem: Pt reports an increase in severity of symptoms in the last month; Severity of problem: severe  OBJECTIVE: Mood: Anxious and Depressed and Affect: Tearful Risk of harm to self or others: No plan to harm self or others  LIFE CONTEXT: Family and Social: Pt is receiving support from her adult son and his fiancee. She has family who resides locally and provide limited support School/Work: Pt receives [employer] and food stamps. She is no longer in school due to inability to focus and/or concentrate Self-Care: Pt is open to participating in medication management and initiating psychotherapy. She smokes cigarettes to cope with stressors. 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 Life Changes: Pt has difficulty managing ongoing medical and behavioral health concerns. She complains of chronic pain that is negatively impacting functioning  GOALS ADDRESSED: Patient will: 1.  Reduce symptoms of: anxiety and depression  2.  Increase knowledge and/or ability of: coping skills and self-management skills  3.  Demonstrate ability to: Increase adequate support systems for patient/family and Improve medication compliance  INTERVENTIONS: Interventions utilized:   Solution-Focused Strategies, Supportive Counseling, Psychoeducation and/or Health Education and Link to [employer] Standardized Assessments completed: GAD-7 and PHQ 2&9 with C-SSRS  ASSESSMENT: Patient currently experiencing depression and anxiety triggered by pt's ongoing medical concerns and chronic pain that is negatively impacting functioning. She endorses non command audio and visual hallucinations. Pt reports hx of suicidal ideations; however, denied current plan or intention to harm self or others. Safety plan was discussed and crisis intervention resources were provided.   Patient may benefit from psychoeducation, and psychotherapy. LCSWA and pt discussed ways to improve management of health and cope with chronic pain. She is participating in medication management through PCP and receives support from family. She has agreed to re-initiate behavioral health services with [facility]. Pt's daughter in law will accompany her to provide moral support.   PLAN: 1. Follow up with behavioral health clinician on : Pt was encouraged tocontact LCSWA if symptoms worsen or fail to improveto schedule behavioral appointments at [facility]. 2. Behavioral recommendations: LCSWA recommends that pt apply healthy coping skills discussed and re-initiate behavioral health services with [facility]. Pt is encouraged to schedule follow up appointment with LCSWA 3. Referral(s): [place] (LME/Outside Clinic) 4. "From scale of 1-10, how likely are you to follow plan?": 9/10  Rebekah Chesterfield, LCSW 01/19/17 2:03 PM

## 2017-02-01 ENCOUNTER — Ambulatory Visit: Payer: Self-pay | Admitting: Podiatry

## 2017-02-01 MED FILL — ?PENICILLIN VK 500MG TABLET: 500 | 7 days supply | Qty: 21 | Fill #0

## 2017-02-03 ENCOUNTER — Telehealth: Payer: Self-pay | Admitting: *Deleted

## 2017-02-03 NOTE — Telephone Encounter (Signed)
Patient verified DOB Patient is aware of results being normal. No further questions.

## 2017-02-03 NOTE — Telephone Encounter (Signed)
-----   Message from Tresa Garter, MD sent at 02/01/2017  9:13 AM EDT ----- Normal Results

## 2017-02-07 ENCOUNTER — Encounter: Payer: Self-pay | Admitting: Internal Medicine

## 2017-02-07 MED FILL — METFORMIN HCL ER 500 MG TAB: 500 | 30 days supply | Qty: 30 | Fill #0

## 2017-02-07 MED FILL — ?DULOXETINE HCL DR 30 MG CA: 30 MG | 30 days supply | Qty: 30 | Fill #0

## 2017-02-07 MED FILL — hydrOXYzine HCL 25 MG TABS: 25 | 20 days supply | Qty: 60 | Fill #0

## 2017-02-07 MED FILL — HYDROCHLOROTHIAZIDE 25 MG T: 25 | 30 days supply | Qty: 30 | Fill #0

## 2017-02-07 MED FILL — GABAPENTIN 300 MG CAPSULE: 300 | 30 days supply | Qty: 180 | Fill #0

## 2017-02-22 ENCOUNTER — Ambulatory Visit: Payer: Self-pay | Admitting: Podiatry

## 2017-02-27 ENCOUNTER — Telehealth: Payer: Self-pay | Admitting: Internal Medicine

## 2017-02-27 MED FILL — ?AMITRIPTYLINE HCL 50MG TAB: 50 | 30 days supply | Qty: 30 | Fill #1

## 2017-02-27 MED FILL — FAMOTIDINE 20 MG TABLET: 20 | 30 days supply | Qty: 30 | Fill #2

## 2017-02-27 NOTE — Telephone Encounter (Signed)
Patient dropped of a letter for Dr. Doreene Burke

## 2017-03-01 ENCOUNTER — Encounter: Payer: Self-pay | Admitting: Internal Medicine

## 2017-03-01 ENCOUNTER — Ambulatory Visit: Payer: Self-pay | Attending: Internal Medicine | Admitting: Internal Medicine

## 2017-03-01 VITALS — BP 108/74 | HR 95 | Temp 98.4°F | Resp 18 | Ht 65.0 in | Wt 160.0 lb

## 2017-03-01 DIAGNOSIS — Z880 Allergy status to penicillin: Secondary | ICD-10-CM | POA: Insufficient documentation

## 2017-03-01 DIAGNOSIS — G894 Chronic pain syndrome: Secondary | ICD-10-CM | POA: Insufficient documentation

## 2017-03-01 DIAGNOSIS — F419 Anxiety disorder, unspecified: Secondary | ICD-10-CM | POA: Insufficient documentation

## 2017-03-01 DIAGNOSIS — E1142 Type 2 diabetes mellitus with diabetic polyneuropathy: Secondary | ICD-10-CM | POA: Insufficient documentation

## 2017-03-01 DIAGNOSIS — Z7984 Long term (current) use of oral hypoglycemic drugs: Secondary | ICD-10-CM | POA: Insufficient documentation

## 2017-03-01 DIAGNOSIS — Z79899 Other long term (current) drug therapy: Secondary | ICD-10-CM | POA: Insufficient documentation

## 2017-03-01 DIAGNOSIS — F329 Major depressive disorder, single episode, unspecified: Secondary | ICD-10-CM | POA: Insufficient documentation

## 2017-03-01 DIAGNOSIS — I10 Essential (primary) hypertension: Secondary | ICD-10-CM | POA: Insufficient documentation

## 2017-03-01 LAB — POCT GLYCOSYLATED HEMOGLOBIN (HGB A1C): HEMOGLOBIN A1C: 6.3

## 2017-03-01 MED ORDER — ACETAMINOPHEN-CODEINE #3 300-30 MG PO TABS: 1.0000 | ORAL_TABLET | ORAL | 0 refills | Status: DC | PRN

## 2017-03-01 NOTE — Patient Instructions (Addendum)
DASH Eating Plan DASH stands for "Dietary Approaches to Stop Hypertension." The DASH eating plan is a healthy eating plan that has been shown to reduce high blood pressure (hypertension). It may also reduce your risk for type 2 diabetes, heart disease, and stroke. The DASH eating plan may also help with weight loss. What are tips for following this plan? General guidelines  Avoid eating more than 2,300 mg (milligrams) of salt (sodium) a day. If you have hypertension, you may need to reduce your sodium intake to 1,500 mg a day.  Limit alcohol intake to no more than 1 drink a day for nonpregnant women and 2 drinks a day for men. One drink equals 12 oz of beer, 5 oz of wine, or 1 oz of hard liquor.  Work with your health care provider to maintain a healthy body weight or to lose weight. Ask what an ideal weight is for you.  Get at least 30 minutes of exercise that causes your heart to beat faster (aerobic exercise) most days of the week. Activities may include walking, swimming, or biking.  Work with your health care provider or diet and nutrition specialist (dietitian) to adjust your eating plan to your individual calorie needs. Reading food labels  Check food labels for the amount of sodium per serving. Choose foods with less than 5 percent of the Daily Value of sodium. Generally, foods with less than 300 mg of sodium per serving fit into this eating plan.  To find whole grains, look for the word "whole" as the first word in the ingredient list. Shopping  Buy products labeled as "low-sodium" or "no salt added."  Buy fresh foods. Avoid canned foods and premade or frozen meals. Cooking  Avoid adding salt when cooking. Use salt-free seasonings or herbs instead of table salt or sea salt. Check with your health care provider or pharmacist before using salt substitutes.  Do not fry foods. Cook foods using healthy methods such as baking, boiling, grilling, and broiling instead.  Cook with  heart-healthy oils, such as olive, canola, soybean, or sunflower oil. Meal planning   Eat a balanced diet that includes: ? 5 or more servings of fruits and vegetables each day. At each meal, try to fill half of your plate with fruits and vegetables. ? Up to 6-8 servings of whole grains each day. ? Less than 6 oz of lean meat, poultry, or fish each day. A 3-oz serving of meat is about the same size as a deck of cards. One egg equals 1 oz. ? 2 servings of low-fat dairy each day. ? A serving of nuts, seeds, or beans 5 times each week. ? Heart-healthy fats. Healthy fats called Omega-3 fatty acids are found in foods such as flaxseeds and coldwater fish, like sardines, salmon, and mackerel.  Limit how much you eat of the following: ? Canned or prepackaged foods. ? Food that is high in trans fat, such as fried foods. ? Food that is high in saturated fat, such as fatty meat. ? Sweets, desserts, sugary drinks, and other foods with added sugar. ? Full-fat dairy products.  Do not salt foods before eating.  Try to eat at least 2 vegetarian meals each week.  Eat more home-cooked food and less restaurant, buffet, and fast food.  When eating at a restaurant, ask that your food be prepared with less salt or no salt, if possible. What foods are recommended? The items listed may not be a complete list. Talk with your dietitian about what   dietary choices are best for you. Grains Whole-grain or whole-wheat bread. Whole-grain or whole-wheat pasta. Brown rice. Oatmeal. Quinoa. Bulgur. Whole-grain and low-sodium cereals. Pita bread. Low-fat, low-sodium crackers. Whole-wheat flour tortillas. Vegetables Fresh or frozen vegetables (raw, steamed, roasted, or grilled). Low-sodium or reduced-sodium tomato and vegetable juice. Low-sodium or reduced-sodium tomato sauce and tomato paste. Low-sodium or reduced-sodium canned vegetables. Fruits All fresh, dried, or frozen fruit. Canned fruit in natural juice (without  added sugar). Meat and other protein foods Skinless chicken or turkey. Ground chicken or turkey. Pork with fat trimmed off. Fish and seafood. Egg whites. Dried beans, peas, or lentils. Unsalted nuts, nut butters, and seeds. Unsalted canned beans. Lean cuts of beef with fat trimmed off. Low-sodium, lean deli meat. Dairy Low-fat (1%) or fat-free (skim) milk. Fat-free, low-fat, or reduced-fat cheeses. Nonfat, low-sodium ricotta or cottage cheese. Low-fat or nonfat yogurt. Low-fat, low-sodium cheese. Fats and oils Soft margarine without trans fats. Vegetable oil. Low-fat, reduced-fat, or light mayonnaise and salad dressings (reduced-sodium). Canola, safflower, olive, soybean, and sunflower oils. Avocado. Seasoning and other foods Herbs. Spices. Seasoning mixes without salt. Unsalted popcorn and pretzels. Fat-free sweets. What foods are not recommended? The items listed may not be a complete list. Talk with your dietitian about what dietary choices are best for you. Grains Baked goods made with fat, such as croissants, muffins, or some breads. Dry pasta or rice meal packs. Vegetables Creamed or fried vegetables. Vegetables in a cheese sauce. Regular canned vegetables (not low-sodium or reduced-sodium). Regular canned tomato sauce and paste (not low-sodium or reduced-sodium). Regular tomato and vegetable juice (not low-sodium or reduced-sodium). Pickles. Olives. Fruits Canned fruit in a light or heavy syrup. Fried fruit. Fruit in cream or butter sauce. Meat and other protein foods Fatty cuts of meat. Ribs. Fried meat. Bacon. Sausage. Bologna and other processed lunch meats. Salami. Fatback. Hotdogs. Bratwurst. Salted nuts and seeds. Canned beans with added salt. Canned or smoked fish. Whole eggs or egg yolks. Chicken or turkey with skin. Dairy Whole or 2% milk, cream, and half-and-half. Whole or full-fat cream cheese. Whole-fat or sweetened yogurt. Full-fat cheese. Nondairy creamers. Whipped toppings.  Processed cheese and cheese spreads. Fats and oils Butter. Stick margarine. Lard. Shortening. Ghee. Bacon fat. Tropical oils, such as coconut, palm kernel, or palm oil. Seasoning and other foods Salted popcorn and pretzels. Onion salt, garlic salt, seasoned salt, table salt, and sea salt. Worcestershire sauce. Tartar sauce. Barbecue sauce. Teriyaki sauce. Soy sauce, including reduced-sodium. Steak sauce. Canned and packaged gravies. Fish sauce. Oyster sauce. Cocktail sauce. Horseradish that you find on the shelf. Ketchup. Mustard. Meat flavorings and tenderizers. Bouillon cubes. Hot sauce and Tabasco sauce. Premade or packaged marinades. Premade or packaged taco seasonings. Relishes. Regular salad dressings. Where to find more information:  National Heart, Lung, and Blood Institute: www.nhlbi.nih.gov  American Heart Association: www.heart.org Summary  The DASH eating plan is a healthy eating plan that has been shown to reduce high blood pressure (hypertension). It may also reduce your risk for type 2 diabetes, heart disease, and stroke.  With the DASH eating plan, you should limit salt (sodium) intake to 2,300 mg a day. If you have hypertension, you may need to reduce your sodium intake to 1,500 mg a day.  When on the DASH eating plan, aim to eat more fresh fruits and vegetables, whole grains, lean proteins, low-fat dairy, and heart-healthy fats.  Work with your health care provider or diet and nutrition specialist (dietitian) to adjust your eating plan to your individual   calorie needs. This information is not intended to replace advice given to you by your health care provider. Make sure you discuss any questions you have with your health care provider. Document Released: 03/17/2011 Document Revised: 03/21/2016 Document Reviewed: 03/21/2016 Elsevier Interactive Patient Education  2017 [employer] Hypertension Hypertension is another name for high blood pressure. High blood pressure forces  your heart to work harder to pump blood. This can cause problems over time. There are two numbers in a blood pressure reading. There is a top number (systolic) over a bottom number (diastolic). It is best to have a blood pressure below 120/80. Healthy choices can help lower your blood pressure. You may need medicine to help lower your blood pressure if:  Your blood pressure cannot be lowered with healthy choices.  Your blood pressure is higher than 130/80.  Follow these instructions at home: Eating and drinking  If directed, follow the DASH eating plan. This diet includes: ? Filling half of your plate at each meal with fruits and vegetables. ? Filling one quarter of your plate at each meal with whole grains. Whole grains include whole wheat pasta, brown rice, and whole grain bread. ? Eating or drinking low-fat dairy products, such as skim milk or low-fat yogurt. ? Filling one quarter of your plate at each meal with low-fat (lean) proteins. Low-fat proteins include fish, skinless chicken, eggs, beans, and tofu. ? Avoiding fatty meat, cured and processed meat, or chicken with skin. ? Avoiding premade or processed food.  Eat less than 1,500 mg of salt (sodium) a day.  Limit alcohol use to no more than 1 drink a day for nonpregnant women and 2 drinks a day for men. One drink equals 12 oz of beer, 5 oz of wine, or 1 oz of hard liquor. Lifestyle  Work with your doctor to stay at a healthy weight or to lose weight. Ask your doctor what the best weight is for you.  Get at least 30 minutes of exercise that causes your heart to beat faster (aerobic exercise) most days of the week. This may include walking, swimming, or biking.  Get at least 30 minutes of exercise that strengthens your muscles (resistance exercise) at least 3 days a week. This may include lifting weights or pilates.  Do not use any products that contain nicotine or tobacco. This includes cigarettes and e-cigarettes. If you need  help quitting, ask your doctor.  Check your blood pressure at home as told by your doctor.  Keep all follow-up visits as told by your doctor. This is important. Medicines  Take over-the-counter and prescription medicines only as told by your doctor. Follow directions carefully.  Do not skip doses of blood pressure medicine. The medicine does not work as well if you skip doses. Skipping doses also puts you at risk for problems.  Ask your doctor about side effects or reactions to medicines that you should watch for. Contact a doctor if:  You think you are having a reaction to the medicine you are taking.  You have headaches that keep coming back (recurring).  You feel dizzy.  You have swelling in your ankles.  You have trouble with your vision. Get help right away if:  You get a very bad headache.  You start to feel confused.  You feel weak or numb.  You feel faint.  You get very bad pain in your: ? Chest. ? Belly (abdomen).  You throw up (vomit) more than once.  You have trouble breathing. Summary  Hypertension is another name for high blood pressure.  Making healthy choices can help lower blood pressure. If your blood pressure cannot be controlled with healthy choices, you may need to take medicine. This information is not intended to replace advice given to you by your health care provider. Make sure you discuss any questions you have with your health care provider. Document Released: 09/14/2007 Document Revised: 02/24/2016 Document Reviewed: 02/24/2016 Elsevier Interactive Patient Education  2018 [employer].  Chronic Pain, Adult Chronic pain is a type of pain that lasts or keeps coming back (recurs) for at least six months. You may have chronic headaches, abdominal pain, or body pain. Chronic pain may be related to an illness, such as fibromyalgia or complex regional pain syndrome. Sometimes the cause of chronic pain is not known. Chronic pain can make it hard  for you to do daily activities. If not treated, chronic pain can lead to other health problems, including anxiety and depression. Treatment depends on the cause and severity of your pain. You may need to work with a pain specialist to come up with a treatment plan. The plan may include medicine, counseling, and physical therapy. Many people benefit from a combination of two or more types of treatment to control their pain. Follow these instructions at home: Lifestyle Consider keeping a pain diary to share with your health care providers. Consider talking with a mental health care provider (psychologist) about how to cope with chronic pain. Consider joining a chronic pain support group. Try to control or lower your stress levels. Talk to your health care provider about strategies to do this. General instructions  Take over-the-counter and prescription medicines only as told by your health care provider. Follow your treatment plan as told by your health care provider. This may include: Gentle, regular exercise. Eating a healthy diet that includes foods such as vegetables, fruits, fish, and lean meats. Cognitive or behavioral therapy. Working with a Community education officer. Meditation or yoga. Acupuncture or massage therapy. Aroma, color, light, or sound therapy. Local electrical stimulation. Shots (injections) of numbing or pain-relieving medicines into the spine or the area of pain. Check your pain level as told by your health care provider. Ask your health care provider if you should use a pain scale. Learn as much as you can about how to manage your chronic pain. Ask your health care provider if an intensive pain rehabilitation program or a chronic pain specialist would be helpful. Keep all follow-up visits as told by your health care provider. This is important. Contact a health care provider if: Your pain gets worse. You have new pain. You have trouble sleeping. You have trouble doing your  normal activities. Your pain is not controlled with treatment. Your have side effects from pain medicine. You feel weak. Get help right away if: You lose feeling or have numbness in your body. You lose control of bowel or bladder function. Your pain suddenly gets much worse. You develop shaking or chills. You develop confusion. You develop chest pain. You have trouble breathing or shortness of breath. You pass out. You have thoughts about hurting yourself or others. This information is not intended to replace advice given to you by your health care provider. Make sure you discuss any questions you have with your health care provider. Document Released: 12/18/2001 Document Revised: 11/26/2015 Document Reviewed: 09/15/2015 Elsevier Interactive Patient Education  2017 [employer].

## 2017-03-01 NOTE — Progress Notes (Signed)
Marlyss Cissell, is a 51 y.o. female  TWK:[id number]  RNH:[id number]  DOB - [birth year]  Chief Complaint  Patient presents with  . Referral      Subjective:   Carisha Kantor is a 51 y.o. female with history of hypertension, prediabetes, chronic pain syndrome, anxiety and depression who presents here today for a follow up visit and to request referral to a Neurologist for her ongoing neuropathic pain. She has no new complaints except for ongoing bilateral leg and foot pain from neuropathy. Patient has No headache, No chest pain, No abdominal pain - No Nausea, No new weakness tingling or numbness, No Cough - SOB.  ALLERGIES: Allergies  Allergen Reactions  . Amoxicillin   . Darvocet [Propoxyphene N-Acetaminophen]     Makes her jittery  . Hydrocodone-Acetaminophen     REACTION: Upset stomach  . Percocet [Oxycodone-Acetaminophen]     Makes her jittery  . Valium [Diazepam] Other (See Comments)    UNKNOWN Makes pt. Feel "out of wack"    PAST MEDICAL HISTORY: Past Medical History:  Diagnosis Date  . Anemia   . Anxiety   . Blood transfusion without reported diagnosis    childbirth  . Clotting disorder ([place])    childbirth  . Depression   . Hypertension   . Injury    right side from jumping off bunkbed  . Pre-diabetes     MEDICATIONS AT HOME: Prior to Admission medications   Medication Sig Start Date End Date Taking? Authorizing Provider  acetaminophen-codeine (TYLENOL #3) 300-30 MG tablet Take 1 tablet by mouth every 4 (four) hours as needed. 03/01/17   Tresa Garter, MD  amitriptyline (ELAVIL) 50 MG tablet Take 1 tablet (50 mg total) by mouth at bedtime. 10/05/16   Tresa Garter, MD  Biotin (BIOTIN MAXIMUM STRENGTH) 10 MG TABS Take 5,000 mg by mouth daily.     [provider]  diclofenac (VOLTAREN) 75 MG EC tablet Take 1 tablet (75 mg total) by mouth 2 (two) times daily. 02/12/16   Tresa Garter, MD  DULoxetine (CYMBALTA) 30 MG capsule Take 1  capsule (30 mg total) by mouth daily. 01/18/17   Tresa Garter, MD  famotidine (PEPCID) 20 MG tablet Take 1 tablet (20 mg total) by mouth daily. 10/05/16   Tresa Garter, MD  gabapentin (NEURONTIN) 300 MG capsule TAKE 2 CAPSULES BY MOUTH 3 TIMES DAILY. 01/18/17   Tresa Garter, MD  hydrochlorothiazide (HYDRODIURIL) 25 MG tablet Take 1 tablet (25 mg total) by mouth daily. 01/18/17   Tresa Garter, MD  hydrOXYzine (ATARAX/VISTARIL) 25 MG tablet Take 1 tablet (25 mg total) by mouth 3 (three) times daily as needed. 01/18/17   Tresa Garter, MD  ibuprofen (ADVIL,MOTRIN) 800 MG tablet TAKE 1 TABLET BY MOUTH EVERY 8 HOURS AS NEEDED. 12/06/16   Tresa Garter, MD  metFORMIN (GLUCOPHAGE-XR) 500 MG 24 hr tablet Take 1 tablet (500 mg total) by mouth daily with breakfast. 01/18/17   Tresa Garter, MD    Objective:   Vitals:   03/01/17 1141  BP: 108/74  Pulse: 95  Resp: 18  Temp: 98.4 F (36.9 C)  TempSrc: Oral  SpO2: 99%  Weight: 160 lb (72.6 kg)  Height: 5\' 5"  (1.651 m)   Exam General appearance : Awake, alert, not in any distress. Speech Clear. Not toxic looking HEENT: Atraumatic and Normocephalic, pupils equally reactive to light and accomodation Neck: Supple, no JVD. No cervical lymphadenopathy.  Chest: Good air entry bilaterally,  no added sounds  CVS: S1 S2 regular, no murmurs.  Abdomen: Bowel sounds present, Non tender and not distended with no gaurding, rigidity or rebound. Extremities: B/L Lower Ext shows no edema, both legs are warm to touch Neurology: Awake alert, and oriented X 3, CN II-XII intact, Non focal Skin: No Rash  Data Review Lab Results  Component Value Date   HGBA1C 6.3 03/01/2017   HGBA1C 6.3 10/05/2016   HGBA1C 6.3 01/21/2016    Assessment & Plan   1. Type 2 diabetes mellitus with diabetic polyneuropathy, without long-term current use of insulin (HCC)  - HgB A1c  Aim for 30 minutes of exercise most  days. Rethink what you drink. Water is great! Aim for 2-3 Carb Choices per meal (30-45 grams) +/- 1 either way  Aim for 0-15 Carbs per snack if hungry  Include protein in moderation with your meals and snacks  Consider reading food labels for Total Carbohydrate and Fat Grams of foods  Consider checking BG at alternate times per day  Continue taking medication as directed Be mindful about how much sugar you are adding to beverages and other foods. Fruit Punch - find one with no sugar  Measure and decrease portions of carbohydrate foods  Make your plate and don't go back for seconds  2. Essential hypertension, benign  We have discussed target BP range and blood pressure goal. I have advised patient to check BP regularly and to call us back or report to clinic if the numbers are consistently higher than 140/90. We discussed the importance of compliance with medical therapy and DASH diet recommended, consequences of uncontrolled hypertension discussed.  - continue current BP medications  3. Chronic pain syndrome  - Continue Tylenol #3  4. Diabetic polyneuropathy associated with type 2 diabetes mellitus ([place])  - Ambulatory referral to Neurology  Patient have been counseled extensively about nutrition and exercise. Other issues discussed during this visit include: low cholesterol diet, weight control and daily exercise, foot care, annual eye examinations at Ophthalmology, importance of adherence with medications and regular follow-up. We also discussed long term complications of uncontrolled diabetes and hypertension.   Return in about 3 months (around 06/01/2017) for Hemoglobin A1C and Follow up, DM, Follow up HTN.  The patient was given clear instructions to go to ER or return to medical center if symptoms don't improve, worsen or new problems develop. The patient verbalized understanding. The patient was told to call to get lab results if they haven't heard anything in the next week.    This note has been created with Surveyor, quantity. Any transcriptional errors are unintentional.    Angelica Chessman, MD, [place], Karilyn Cota, [place] [facility] [place], [place]   03/01/2017, 12:01 PM

## 2017-03-06 ENCOUNTER — Encounter: Payer: Self-pay | Admitting: Neurology

## 2017-03-14 MED FILL — ACETAMINOPHEN/COD #3 TABLET: 300-30 | 10 days supply | Qty: 60 | Fill #0

## 2017-03-16 ENCOUNTER — Encounter: Payer: Self-pay | Admitting: Obstetrics and Gynecology

## 2017-03-24 ENCOUNTER — Encounter (HOSPITAL_COMMUNITY): Payer: Self-pay

## 2017-04-13 ENCOUNTER — Encounter: Payer: Self-pay | Admitting: Obstetrics and Gynecology

## 2017-04-13 ENCOUNTER — Ambulatory Visit (INDEPENDENT_AMBULATORY_CARE_PROVIDER_SITE_OTHER): Payer: Self-pay | Admitting: Obstetrics and Gynecology

## 2017-04-13 VITALS — BP 133/95 | HR 85 | Ht 65.0 in | Wt 165.0 lb

## 2017-04-13 DIAGNOSIS — N764 Abscess of vulva: Secondary | ICD-10-CM

## 2017-04-13 DIAGNOSIS — Z113 Encounter for screening for infections with a predominantly sexual mode of transmission: Secondary | ICD-10-CM

## 2017-04-13 LAB — POCT PREGNANCY, URINE: PREG TEST UR: NEGATIVE

## 2017-04-13 MED ORDER — CEPHALEXIN 500 MG PO CAPS: 500.0000 mg | ORAL_CAPSULE | Freq: Four times a day (QID) | ORAL | 0 refills | Status: DC

## 2017-04-13 NOTE — Progress Notes (Signed)
Pt.states has diabetic nerve pain in feet.

## 2017-04-13 NOTE — Progress Notes (Signed)
52 yo with BMI 27 here for the evaluation of Bartholin abscess. Patient reports persistent draining since her last I&D in 2017. She describes it as leakage of a white to yellow fluid, enough that she needs to wear a pad. She states that at times she develops some tenderness to the area, applies warm compresses and notices this drainage. She states that the area never increases in size as typical as her abscess typically does. Patient is not sexually active. She reports irregular menses, often skipping months followed by a normal 3-4 day period.  Past Medical History:  Diagnosis Date  . Anemia   . Anxiety   . Blood transfusion without reported diagnosis    childbirth  . Clotting disorder ([place])    childbirth  . Depression   . Diabetes mellitus without complication ([place])   . Hypertension   . Injury    right side from jumping off bunkbed  . Pre-diabetes    Past Surgical History:  Procedure Laterality Date  . CESAREAN SECTION    . cyst removal 1997    . FACIAL RECONSTRUCTION SURGERY    . FOOT SURGERY Bilateral    Family History  Problem Relation Age of Onset  . Heart disease Mother   . Asthma Sister   . COPD Sister   . Breast cancer Neg Hx   . Colon cancer Neg Hx    Social History   Tobacco Use  . Smoking status: Current Every Day Smoker    Packs/day: 1.00    Years: 29.00    Pack years: 29.00    Types: Cigarettes  . Smokeless tobacco: Never Used  . Tobacco comment: smokes 1-2 ppd  Substance Use Topics  . Alcohol use: No    Alcohol/week: 0.0 oz    Comment: Quit drinking in 2012.  . Drug use: No   ROS See pertinent in HPI Blood pressure (!) 133/95, pulse 85, height 5\' 5"  (1.651 m), weight 165 lb (74.8 kg), last menstrual period 04/04/2017. GENERAL: Well-developed, well-nourished female in no acute distress.  ABDOMEN: Soft, nontender, nondistended. No organomegaly. PELVIC: Normal external female genitalia. Vagina is pink and rugated.  Normal discharge. Normal appearing  cervix. Uterus is normal in size. No adnexal mass or tenderness. No abnormalities initially noted. Normal vaginal mucosa without evidence of bartholin abscess. Patient then pointed to an area adjacent to right labia majora which started to ooze purulent material when squeezed from a pinpoint opening. No palpable vulva abscess EXTREMITIES: No cyanosis, clubbing, or edema, 2+ distal pulses.  A/P 52 yo with spontaneous draining abscess - Advised patient to continue sitz baths and warm compresses - Rx Keflex provided - RTC in 2 weeks if no improvement in symptoms - patient with normal pap smear in 05/2016 and she is scheduled for a screening mammogram in January

## 2017-04-14 LAB — CERVICOVAGINAL ANCILLARY ONLY
BACTERIAL VAGINITIS: POSITIVE — AB
Candida vaginitis: NEGATIVE
Chlamydia: NEGATIVE
Neisseria Gonorrhea: NEGATIVE
TRICH (WINDOWPATH): NEGATIVE

## 2017-04-16 LAB — WOUND CULTURE

## 2017-04-17 ENCOUNTER — Other Ambulatory Visit: Payer: Self-pay | Admitting: Obstetrics and Gynecology

## 2017-04-17 MED ORDER — METRONIDAZOLE 500 MG PO TABS: 500.0000 mg | ORAL_TABLET | Freq: Two times a day (BID) | ORAL | 0 refills | Status: DC

## 2017-04-24 ENCOUNTER — Other Ambulatory Visit: Payer: Self-pay | Admitting: Internal Medicine

## 2017-04-24 MED FILL — ?AMITRIPTYLINE HCL 50MG TAB: 50 | 30 days supply | Qty: 30 | Fill #2

## 2017-04-24 MED FILL — HYDROCHLOROTHIAZIDE 25 MG T: 25 | 30 days supply | Qty: 30 | Fill #1

## 2017-04-24 MED FILL — DULoxetine HCL 30 MG CPEP: 30 | 30 days supply | Qty: 30 | Fill #1

## 2017-04-24 MED FILL — CEPHALEXIN 500 MG CAPSULE: 500 | 14 days supply | Qty: 56 | Fill #0

## 2017-04-24 MED FILL — ?FAMOTIDINE 20 MG TABLET: 20 | 30 days supply | Qty: 30 | Fill #3

## 2017-05-03 ENCOUNTER — Other Ambulatory Visit: Payer: Self-pay | Admitting: Obstetrics and Gynecology

## 2017-05-03 ENCOUNTER — Ambulatory Visit: Payer: Self-pay

## 2017-05-03 DIAGNOSIS — Z1231 Encounter for screening mammogram for malignant neoplasm of breast: Secondary | ICD-10-CM

## 2017-05-17 ENCOUNTER — Ambulatory Visit: Payer: Self-pay | Attending: Internal Medicine | Admitting: Internal Medicine

## 2017-05-17 ENCOUNTER — Encounter: Payer: Self-pay | Admitting: Internal Medicine

## 2017-05-17 VITALS — BP 117/85 | HR 77 | Temp 98.2°F | Resp 16

## 2017-05-17 DIAGNOSIS — F419 Anxiety disorder, unspecified: Secondary | ICD-10-CM | POA: Insufficient documentation

## 2017-05-17 DIAGNOSIS — F331 Major depressive disorder, recurrent, moderate: Secondary | ICD-10-CM | POA: Insufficient documentation

## 2017-05-17 DIAGNOSIS — F17210 Nicotine dependence, cigarettes, uncomplicated: Secondary | ICD-10-CM | POA: Insufficient documentation

## 2017-05-17 DIAGNOSIS — E1142 Type 2 diabetes mellitus with diabetic polyneuropathy: Secondary | ICD-10-CM | POA: Insufficient documentation

## 2017-05-17 DIAGNOSIS — I10 Essential (primary) hypertension: Secondary | ICD-10-CM | POA: Insufficient documentation

## 2017-05-17 DIAGNOSIS — F17200 Nicotine dependence, unspecified, uncomplicated: Secondary | ICD-10-CM

## 2017-05-17 DIAGNOSIS — E119 Type 2 diabetes mellitus without complications: Secondary | ICD-10-CM | POA: Insufficient documentation

## 2017-05-17 DIAGNOSIS — G894 Chronic pain syndrome: Secondary | ICD-10-CM | POA: Insufficient documentation

## 2017-05-17 DIAGNOSIS — Z7984 Long term (current) use of oral hypoglycemic drugs: Secondary | ICD-10-CM | POA: Insufficient documentation

## 2017-05-17 DIAGNOSIS — Z79899 Other long term (current) drug therapy: Secondary | ICD-10-CM | POA: Insufficient documentation

## 2017-05-17 LAB — GLUCOSE, POCT (MANUAL RESULT ENTRY): POC Glucose: 112 mg/dl — AB (ref 70–99)

## 2017-05-17 LAB — POCT GLYCOSYLATED HEMOGLOBIN (HGB A1C): HEMOGLOBIN A1C: 6.3

## 2017-05-17 MED ORDER — DULOXETINE HCL 30 MG PO CPEP: 30.0000 mg | ORAL_CAPSULE | Freq: Every day | ORAL | 3 refills | Status: DC

## 2017-05-17 MED ORDER — ACETAMINOPHEN-CODEINE #3 300-30 MG PO TABS: 1.0000 | ORAL_TABLET | ORAL | 0 refills | Status: DC | PRN

## 2017-05-17 MED ORDER — GABAPENTIN 400 MG PO CAPS: ORAL_CAPSULE | ORAL | 3 refills | Status: DC

## 2017-05-17 NOTE — Patient Instructions (Signed)
DASH Eating Plan DASH stands for "Dietary Approaches to Stop Hypertension." The DASH eating plan is a healthy eating plan that has been shown to reduce high blood pressure (hypertension). It may also reduce your risk for type 2 diabetes, heart disease, and stroke. The DASH eating plan may also help with weight loss. What are tips for following this plan? General guidelines  Avoid eating more than 2,300 mg (milligrams) of salt (sodium) a day. If you have hypertension, you may need to reduce your sodium intake to 1,500 mg a day.  Limit alcohol intake to no more than 1 drink a day for nonpregnant women and 2 drinks a day for men. One drink equals 12 oz of beer, 5 oz of wine, or 1 oz of hard liquor.  Work with your health care provider to maintain a healthy body weight or to lose weight. Ask what an ideal weight is for you.  Get at least 30 minutes of exercise that causes your heart to beat faster (aerobic exercise) most days of the week. Activities may include walking, swimming, or biking.  Work with your health care provider or diet and nutrition specialist (dietitian) to adjust your eating plan to your individual calorie needs. Reading food labels  Check food labels for the amount of sodium per serving. Choose foods with less than 5 percent of the Daily Value of sodium. Generally, foods with less than 300 mg of sodium per serving fit into this eating plan.  To find whole grains, look for the word "whole" as the first word in the ingredient list. Shopping  Buy products labeled as "low-sodium" or "no salt added."  Buy fresh foods. Avoid canned foods and premade or frozen meals. Cooking  Avoid adding salt when cooking. Use salt-free seasonings or herbs instead of table salt or sea salt. Check with your health care provider or pharmacist before using salt substitutes.  Do not fry foods. Cook foods using healthy methods such as baking, boiling, grilling, and broiling instead.  Cook with  heart-healthy oils, such as olive, canola, soybean, or sunflower oil. Meal planning   Eat a balanced diet that includes: ? 5 or more servings of fruits and vegetables each day. At each meal, try to fill half of your plate with fruits and vegetables. ? Up to 6-8 servings of whole grains each day. ? Less than 6 oz of lean meat, poultry, or fish each day. A 3-oz serving of meat is about the same size as a deck of cards. One egg equals 1 oz. ? 2 servings of low-fat dairy each day. ? A serving of nuts, seeds, or beans 5 times each week. ? Heart-healthy fats. Healthy fats called Omega-3 fatty acids are found in foods such as flaxseeds and coldwater fish, like sardines, salmon, and mackerel.  Limit how much you eat of the following: ? Canned or prepackaged foods. ? Food that is high in trans fat, such as fried foods. ? Food that is high in saturated fat, such as fatty meat. ? Sweets, desserts, sugary drinks, and other foods with added sugar. ? Full-fat dairy products.  Do not salt foods before eating.  Try to eat at least 2 vegetarian meals each week.  Eat more home-cooked food and less restaurant, buffet, and fast food.  When eating at a restaurant, ask that your food be prepared with less salt or no salt, if possible. What foods are recommended? The items listed may not be a complete list. Talk with your dietitian about what   dietary choices are best for you. Grains Whole-grain or whole-wheat bread. Whole-grain or whole-wheat pasta. Brown rice. Oatmeal. Quinoa. Bulgur. Whole-grain and low-sodium cereals. Pita bread. Low-fat, low-sodium crackers. Whole-wheat flour tortillas. Vegetables Fresh or frozen vegetables (raw, steamed, roasted, or grilled). Low-sodium or reduced-sodium tomato and vegetable juice. Low-sodium or reduced-sodium tomato sauce and tomato paste. Low-sodium or reduced-sodium canned vegetables. Fruits All fresh, dried, or frozen fruit. Canned fruit in natural juice (without  added sugar). Meat and other protein foods Skinless chicken or turkey. Ground chicken or turkey. Pork with fat trimmed off. Fish and seafood. Egg whites. Dried beans, peas, or lentils. Unsalted nuts, nut butters, and seeds. Unsalted canned beans. Lean cuts of beef with fat trimmed off. Low-sodium, lean deli meat. Dairy Low-fat (1%) or fat-free (skim) milk. Fat-free, low-fat, or reduced-fat cheeses. Nonfat, low-sodium ricotta or cottage cheese. Low-fat or nonfat yogurt. Low-fat, low-sodium cheese. Fats and oils Soft margarine without trans fats. Vegetable oil. Low-fat, reduced-fat, or light mayonnaise and salad dressings (reduced-sodium). Canola, safflower, olive, soybean, and sunflower oils. Avocado. Seasoning and other foods Herbs. Spices. Seasoning mixes without salt. Unsalted popcorn and pretzels. Fat-free sweets. What foods are not recommended? The items listed may not be a complete list. Talk with your dietitian about what dietary choices are best for you. Grains Baked goods made with fat, such as croissants, muffins, or some breads. Dry pasta or rice meal packs. Vegetables Creamed or fried vegetables. Vegetables in a cheese sauce. Regular canned vegetables (not low-sodium or reduced-sodium). Regular canned tomato sauce and paste (not low-sodium or reduced-sodium). Regular tomato and vegetable juice (not low-sodium or reduced-sodium). Pickles. Olives. Fruits Canned fruit in a light or heavy syrup. Fried fruit. Fruit in cream or butter sauce. Meat and other protein foods Fatty cuts of meat. Ribs. Fried meat. Bacon. Sausage. Bologna and other processed lunch meats. Salami. Fatback. Hotdogs. Bratwurst. Salted nuts and seeds. Canned beans with added salt. Canned or smoked fish. Whole eggs or egg yolks. Chicken or turkey with skin. Dairy Whole or 2% milk, cream, and half-and-half. Whole or full-fat cream cheese. Whole-fat or sweetened yogurt. Full-fat cheese. Nondairy creamers. Whipped toppings.  Processed cheese and cheese spreads. Fats and oils Butter. Stick margarine. Lard. Shortening. Ghee. Bacon fat. Tropical oils, such as coconut, palm kernel, or palm oil. Seasoning and other foods Salted popcorn and pretzels. Onion salt, garlic salt, seasoned salt, table salt, and sea salt. Worcestershire sauce. Tartar sauce. Barbecue sauce. Teriyaki sauce. Soy sauce, including reduced-sodium. Steak sauce. Canned and packaged gravies. Fish sauce. Oyster sauce. Cocktail sauce. Horseradish that you find on the shelf. Ketchup. Mustard. Meat flavorings and tenderizers. Bouillon cubes. Hot sauce and Tabasco sauce. Premade or packaged marinades. Premade or packaged taco seasonings. Relishes. Regular salad dressings. Where to find more information:  National Heart, Lung, and Blood Institute: www.nhlbi.nih.gov  American Heart Association: www.heart.org Summary  The DASH eating plan is a healthy eating plan that has been shown to reduce high blood pressure (hypertension). It may also reduce your risk for type 2 diabetes, heart disease, and stroke.  With the DASH eating plan, you should limit salt (sodium) intake to 2,300 mg a day. If you have hypertension, you may need to reduce your sodium intake to 1,500 mg a day.  When on the DASH eating plan, aim to eat more fresh fruits and vegetables, whole grains, lean proteins, low-fat dairy, and heart-healthy fats.  Work with your health care provider or diet and nutrition specialist (dietitian) to adjust your eating plan to your individual   calorie needs. This information is not intended to replace advice given to you by your health care provider. Make sure you discuss any questions you have with your health care provider. Document Released: 03/17/2011 Document Revised: 03/21/2016 Document Reviewed: 03/21/2016 Elsevier Interactive Patient Education  2018 Elsevier Inc.  Hypertension Hypertension is another name for high blood pressure. High blood pressure  forces your heart to work harder to pump blood. This can cause problems over time. There are two numbers in a blood pressure reading. There is a top number (systolic) over a bottom number (diastolic). It is best to have a blood pressure below 120/80. Healthy choices can help lower your blood pressure. You may need medicine to help lower your blood pressure if:  Your blood pressure cannot be lowered with healthy choices.  Your blood pressure is higher than 130/80.  Follow these instructions at home: Eating and drinking  If directed, follow the DASH eating plan. This diet includes: ? Filling half of your plate at each meal with fruits and vegetables. ? Filling one quarter of your plate at each meal with whole grains. Whole grains include whole wheat pasta, brown rice, and whole grain bread. ? Eating or drinking low-fat dairy products, such as skim milk or low-fat yogurt. ? Filling one quarter of your plate at each meal with low-fat (lean) proteins. Low-fat proteins include fish, skinless chicken, eggs, beans, and tofu. ? Avoiding fatty meat, cured and processed meat, or chicken with skin. ? Avoiding premade or processed food.  Eat less than 1,500 mg of salt (sodium) a day.  Limit alcohol use to no more than 1 drink a day for nonpregnant women and 2 drinks a day for men. One drink equals 12 oz of beer, 5 oz of wine, or 1 oz of hard liquor. Lifestyle  Work with your doctor to stay at a healthy weight or to lose weight. Ask your doctor what the best weight is for you.  Get at least 30 minutes of exercise that causes your heart to beat faster (aerobic exercise) most days of the week. This may include walking, swimming, or biking.  Get at least 30 minutes of exercise that strengthens your muscles (resistance exercise) at least 3 days a week. This may include lifting weights or pilates.  Do not use any products that contain nicotine or tobacco. This includes cigarettes and e-cigarettes. If you  need help quitting, ask your doctor.  Check your blood pressure at home as told by your doctor.  Keep all follow-up visits as told by your doctor. This is important. Medicines  Take over-the-counter and prescription medicines only as told by your doctor. Follow directions carefully.  Do not skip doses of blood pressure medicine. The medicine does not work as well if you skip doses. Skipping doses also puts you at risk for problems.  Ask your doctor about side effects or reactions to medicines that you should watch for. Contact a doctor if:  You think you are having a reaction to the medicine you are taking.  You have headaches that keep coming back (recurring).  You feel dizzy.  You have swelling in your ankles.  You have trouble with your vision. Get help right away if:  You get a very bad headache.  You start to feel confused.  You feel weak or numb.  You feel faint.  You get very bad pain in your: ? Chest. ? Belly (abdomen).  You throw up (vomit) more than once.  You have trouble breathing.   Summary  Hypertension is another name for high blood pressure.  Making healthy choices can help lower blood pressure. If your blood pressure cannot be controlled with healthy choices, you may need to take medicine. This information is not intended to replace advice given to you by your health care provider. Make sure you discuss any questions you have with your health care provider. Document Released: 09/14/2007 Document Revised: 02/24/2016 Document Reviewed: 02/24/2016 Elsevier Interactive Patient Education  2018 Elsevier Inc.  

## 2017-05-17 NOTE — Progress Notes (Signed)
Kendra Griffith, is a 52 y.o. female  [id number]  [id number]  DOB - 03-20-1966  Chief Complaint  Patient presents with  . case management  . Diabetes  . Hypertension       Subjective:   Kendra Griffith is a 52 y.o. female with history of hypertension, prediabetes, chronic pain syndrome, anxiety and depression who presents here today for a follow up visit and medication refill. She has no major complaint today except for ongoing neuropathic pain and general anxiety about social factors influencing her health. She is financially distressed. She however denies suicidal ideation or thoughts. Patient has No headache, No chest pain, No abdominal pain - No Nausea, No new weakness tingling or numbness, No Cough - SOB. She continues to smoke heavily especially now with so much stress.  No problems updated.  ALLERGIES: Allergies  Allergen Reactions  . Amoxicillin   . Darvocet [Propoxyphene N-Acetaminophen]     Makes her jittery  . Hydrocodone-Acetaminophen     REACTION: Upset stomach  . Percocet [Oxycodone-Acetaminophen]     Makes her jittery  . Valium [Diazepam] Other (See Comments)    UNKNOWN Makes pt. Feel "out of wack"    PAST MEDICAL HISTORY: Past Medical History:  Diagnosis Date  . Anemia   . Anxiety   . Blood transfusion without reported diagnosis    childbirth  . Clotting disorder ([place])    childbirth  . Depression   . Diabetes mellitus without complication ([place])   . Hypertension   . Injury    right side from jumping off bunkbed  . Pre-diabetes     MEDICATIONS AT HOME: Prior to Admission medications   Medication Sig Start Date End Date Taking? Authorizing Provider  acetaminophen-codeine (TYLENOL #3) 300-30 MG tablet Take 1 tablet by mouth every 4 (four) hours as needed. 05/17/17   Tresa Garter, MD  amitriptyline (ELAVIL) 50 MG tablet Take 1 tablet (50 mg total) by mouth at bedtime. 10/05/16   Tresa Garter, MD  Biotin (BIOTIN MAXIMUM STRENGTH)  10 MG TABS Take 5,000 mg by mouth daily.     [provider]  DULoxetine (CYMBALTA) 30 MG capsule Take 1 capsule (30 mg total) by mouth daily. 05/17/17   Tresa Garter, MD  famotidine (PEPCID) 20 MG tablet Take 1 tablet (20 mg total) by mouth daily. 10/05/16   Tresa Garter, MD  gabapentin (NEURONTIN) 400 MG capsule TAKE 2 CAPSULES BY MOUTH 3 TIMES DAILY. 05/17/17   Tresa Garter, MD  hydrochlorothiazide (HYDRODIURIL) 25 MG tablet Take 1 tablet (25 mg total) by mouth daily. 01/18/17   Tresa Garter, MD  hydrOXYzine (ATARAX/VISTARIL) 25 MG tablet Take 1 tablet (25 mg total) by mouth 3 (three) times daily as needed. Patient not taking: Reported on 04/13/2017 01/18/17   Tresa Garter, MD  ibuprofen (ADVIL,MOTRIN) 800 MG tablet TAKE 1 TABLET BY MOUTH EVERY 8 HOURS AS NEEDED. 12/06/16   Tresa Garter, MD  metFORMIN (GLUCOPHAGE-XR) 500 MG 24 hr tablet Take 1 tablet (500 mg total) by mouth daily with breakfast. 01/18/17   Tresa Garter, MD    Objective:   Vitals:   05/17/17 1201  BP: 117/85  Pulse: 77  Resp: 16  Temp: 98.2 F (36.8 C)  TempSrc: Oral  SpO2: 99%   Exam General appearance : Awake, alert, not in any distress. Speech Clear. Not toxic looking HEENT: Atraumatic and Normocephalic, pupils equally reactive to light and accomodation Neck: Supple, no JVD. No cervical lymphadenopathy.  Chest: Good air entry bilaterally, no added sounds  CVS: S1 S2 regular, no murmurs.  Abdomen: Bowel sounds present, Non tender and not distended with no gaurding, rigidity or rebound. Extremities: B/L Lower Ext shows no edema, both legs are warm to touch Neurology: Awake alert, and oriented X 3, CN II-XII intact, Non focal Skin: No Rash  Data Review Lab Results  Component Value Date   HGBA1C 6.3 05/17/2017   HGBA1C 6.3 03/01/2017   HGBA1C 6.3 10/05/2016    Assessment & Plan   1. Type 2 diabetes mellitus with diabetic polyneuropathy, without  long-term current use of insulin (HCC)  - POCT glucose (manual entry) - POCT glycosylated hemoglobin (Hb A1C) - Microalbumin / creatinine urine ratio  2. Essential hypertension  We have discussed target BP range and blood pressure goal. I have advised patient to check BP regularly and to call us back or report to clinic if the numbers are consistently higher than 140/90. We discussed the importance of compliance with medical therapy and DASH diet recommended, consequences of uncontrolled hypertension discussed.  - continue current BP medications  3. Chronic pain syndrome  - DULoxetine (CYMBALTA) 30 MG capsule; Take 1 capsule (30 mg total) by mouth daily.  Dispense: 30 capsule; Refill: 3  4. Smoking . Kendra Griffith was counseled on the dangers of tobacco use, and was advised to quit. Reviewed strategies to maximize success, including removing cigarettes and smoking materials from environment, stress management and support of family/friends.  5. Moderate episode of recurrent major depressive disorder (HCC)  - DULoxetine (CYMBALTA) 30 MG capsule; Take 1 capsule (30 mg total) by mouth daily.  Dispense: 30 capsule; Refill: 3  6. Type 2 diabetes mellitus without complication, without long-term current use of insulin (HCC)  Increase Gabapentin to 800 mg tid from 600 mg tid  - gabapentin (NEURONTIN) 400 MG capsule; TAKE 2 CAPSULES BY MOUTH 3 TIMES DAILY.  Dispense: 180 capsule; Refill: 3  Patient have been counseled extensively about nutrition and exercise. Other issues discussed during this visit include: low cholesterol diet, weight control and daily exercise, foot care, annual eye examinations at Ophthalmology, importance of adherence with medications and regular follow-up. We also discussed long term complications of uncontrolled diabetes and hypertension.   Return in about 3 months (around 08/14/2017) for Hemoglobin A1C and Follow up, DM, Follow up HTN.  The patient was given clear instructions  to go to ER or return to medical center if symptoms don't improve, worsen or new problems develop. The patient verbalized understanding. The patient was told to call to get lab results if they haven't heard anything in the next week.   This note has been created with Surveyor, quantity. Any transcriptional errors are unintentional.    Angelica Chessman, MD, MHA, Karilyn Cota, [place] [facility] [place], [place]   05/17/2017, 12:23 PM

## 2017-05-18 LAB — MICROALBUMIN / CREATININE URINE RATIO: Creatinine, Urine: 50.1 mg/dL

## 2017-06-08 ENCOUNTER — Encounter (HOSPITAL_COMMUNITY): Payer: Self-pay | Admitting: *Deleted

## 2017-06-08 ENCOUNTER — Ambulatory Visit
Admission: RE | Admit: 2017-06-08 | Discharge: 2017-06-08 | Disposition: A | Discharge status: Home / Self Care | Payer: No Typology Code available for payment source | Source: Ambulatory Visit | Attending: Obstetrics and Gynecology | Admitting: Obstetrics and Gynecology

## 2017-06-08 ENCOUNTER — Ambulatory Visit (HOSPITAL_COMMUNITY)
Admission: RE | Admit: 2017-06-08 | Discharge: 2017-06-08 | Disposition: A | Discharge status: Home / Self Care | Payer: Self-pay | Source: Ambulatory Visit | Attending: Obstetrics and Gynecology | Admitting: Obstetrics and Gynecology

## 2017-06-08 VITALS — BP 116/80 | Ht 65.0 in

## 2017-06-08 DIAGNOSIS — Z1231 Encounter for screening mammogram for malignant neoplasm of breast: Secondary | ICD-10-CM

## 2017-06-08 DIAGNOSIS — Z1239 Encounter for other screening for malignant neoplasm of breast: Secondary | ICD-10-CM

## 2017-06-08 NOTE — Patient Instructions (Signed)
Explained breast self awareness with Kendra Griffith. Patient did not need a Pap smear today due to last Pap smear and HPV typing was 05/31/2016. Let her know BCCCP will cover Pap smears and HPV typing every 5 years unless has a history of abnormal Pap smears. Referred patient to the [place] for a screening mammogram. Appointment scheduled for Thursday, June 08, 2017 at 1110. Let patient know the [facility] will follow up with her within the next couple weeks with results of mammogram by letter or phone. Discussed smoking cessation with patient. Referred patient to the [facility] Quitline and gave resources to the free smoking cessation classes offered through [facility]. Kendra Griffith verbalized understanding.  Merleen Picazo, [place]vil [place], RN 11:20 AM

## 2017-06-08 NOTE — Progress Notes (Signed)
No complaints today.   Pap Smear: Pap smear not completed today. Last Pap smear was 05/31/2016 at [facility] and normal with negative HPV. Per patient has no history of an abnormal Pap smear. Last three Pap smear results are in EPIC.  Physical exam: Breasts Breasts symmetrical. No skin abnormalities bilateral breasts. No nipple retraction bilateral breasts. No nipple discharge bilateral breasts. No lymphadenopathy. No lumps palpated bilateral breasts. No complaints of pain or tenderness on exam. Referred patient to the [facility] for a screening mammogram. Appointment scheduled for Thursday, June 08, 2017 at 1110.        Pelvic/Bimanual No Pap smear completed today since last Pap smear and HPV typing was 05/31/2016. Pap smear not indicated per BCCCP guidelines.   Smoking History: Patient is a current smoker. Discussed smoking cessation with patient. Referred patient to the [facility] Quitline and gave resources to the free smoking cessation classes offered through [facility].  Patient Navigation: Patient education provided. Access to services provided for patient through [place] program.   Colorectal Cancer Screening: Patient had a colonoscopy completed 12/13/2016. No complaints today.   Breast and Cervical Cancer Risk Assessment: Patient has no family history of breast cancer, known genetic mutations, or radiation treatment to the chest before age 40. Patient has no history of cervical dysplasia, immunocompromised, or DES exposure in-utero.

## 2017-06-09 ENCOUNTER — Other Ambulatory Visit: Payer: Self-pay | Admitting: Internal Medicine

## 2017-06-09 ENCOUNTER — Other Ambulatory Visit (INDEPENDENT_AMBULATORY_CARE_PROVIDER_SITE_OTHER): Payer: Self-pay

## 2017-06-09 ENCOUNTER — Encounter: Payer: Self-pay | Admitting: Neurology

## 2017-06-09 ENCOUNTER — Encounter (HOSPITAL_COMMUNITY): Payer: Self-pay | Admitting: *Deleted

## 2017-06-09 ENCOUNTER — Ambulatory Visit (INDEPENDENT_AMBULATORY_CARE_PROVIDER_SITE_OTHER): Payer: Self-pay | Admitting: Neurology

## 2017-06-09 VITALS — BP 120/84 | HR 67 | Ht 65.0 in | Wt 164.1 lb

## 2017-06-09 DIAGNOSIS — Z720 Tobacco use: Secondary | ICD-10-CM

## 2017-06-09 DIAGNOSIS — R292 Abnormal reflex: Secondary | ICD-10-CM

## 2017-06-09 DIAGNOSIS — E0842 Diabetes mellitus due to underlying condition with diabetic polyneuropathy: Secondary | ICD-10-CM

## 2017-06-09 DIAGNOSIS — G621 Alcoholic polyneuropathy: Secondary | ICD-10-CM

## 2017-06-09 DIAGNOSIS — G894 Chronic pain syndrome: Secondary | ICD-10-CM

## 2017-06-09 LAB — TSH: TSH: 2.68 u[IU]/mL (ref 0.35–4.50)

## 2017-06-09 LAB — VITAMIN B12: VITAMIN B 12: 1285 pg/mL — AB (ref 211–911)

## 2017-06-09 LAB — FOLATE

## 2017-06-09 MED FILL — ?FAMOTIDINE 20 MG TABLET: 20 | 30 days supply | Qty: 30 | Fill #4

## 2017-06-09 MED FILL — HYDROCHLOROTHIAZIDE 25 MG T: 25 | 30 days supply | Qty: 30 | Fill #2

## 2017-06-09 MED FILL — ACETAMINOPHEN/COD #3 TABLET: 300-30 | 10 days supply | Qty: 60 | Fill #0

## 2017-06-09 MED FILL — ?AMITRIPTYLINE HCL 50MG TAB: 50 | 30 days supply | Qty: 30 | Fill #3

## 2017-06-09 MED FILL — DULoxetine HCL 30 MG CPEP: 30 | 30 days supply | Qty: 30 | Fill #0

## 2017-06-09 MED FILL — GABAPENTIN 400 MG CAPSULE: 400 | 30 days supply | Qty: 180 | Fill #0

## 2017-06-09 MED FILL — IBUPROFEN 800 MG TABLET: 800 | 20 days supply | Qty: 60 | Fill #0

## 2017-06-09 MED FILL — ?METRONIDAZOLE 500MG TABS: 500 | 7 days supply | Qty: 14 | Fill #0

## 2017-06-09 NOTE — Progress Notes (Signed)
[facility] Clinic Note - Initial Visit   Date: 06/09/17  Kendra Griffith MRN: [id number] DOB: [birth year]   Dear Dr. Doreene Burke:  Thank you for your kind referral of Kendra Griffith for consultation of diabetic neuropathy. Although her history is well known to you, please allow [place] to reiterate it for the purpose of our medical record. The patient was accompanied to the clinic by self.    History of Present Illness: Kendra Griffith is a 52 y.o. right-handed African American female with depression/anxiety, hypertension, diabetes mellitus, chronic pain syndrome and history of alcohol abuse presenting for evaluation of neuropathy.    She has a long history of neuropathy involving the feet dating back to 1980-1990s manifesting with numbness involving the soles of the feet.  She was drinking heavily, about a bottle of vodka daily, since the age of 52.   She has been sober since 2013, when she was diagnosed with diabetes.  Her diabetes has been well-controlled on oral medication, last HbA1c is 6.3.   She denies any numbness of the hands.  She has some imbalance and has been walking with a cane for the past two years.   She takes gabapentin 800mg  three times daily and sometimes, she will take extra from her friend.  She also takes amitriptyine 50mg  and Cymbalta 30mg  for her depression.  Over the past 10 years, she has sporadic electrical pain which involves her arms, legs, and neck.  Pain is unpredictable and severe.  She has been recommended to see pain management, which she has not scheduled yet.  She was diagnosed with diabetes in 2013.    Out-side paper records, electronic medical record, and images have been reviewed where available and summarized as:  Lab Results  Component Value Date   TSH 1.64 01/21/2016   Lab Results  Component Value Date   HGBA1C 6.3 05/17/2017    Past Medical History:  Diagnosis Date  . Anemia   . Anxiety   . Blood transfusion without  reported diagnosis    childbirth  . Clotting disorder ([place])    childbirth  . Depression   . Diabetes mellitus without complication ([place])   . Hypertension   . Injury    right side from jumping off bunkbed  . Pre-diabetes     Past Surgical History:  Procedure Laterality Date  . CESAREAN SECTION    . cyst removal 1997    . FACIAL RECONSTRUCTION SURGERY    . FOOT SURGERY Bilateral      Medications:  Outpatient Encounter Medications as of 06/09/2017  Medication Sig  . acetaminophen-codeine (TYLENOL #3) 300-30 MG tablet Take 1 tablet by mouth every 4 (four) hours as needed.  Marland Kitchen amitriptyline (ELAVIL) 50 MG tablet Take 1 tablet (50 mg total) by mouth at bedtime.  . Biotin (BIOTIN MAXIMUM STRENGTH) 10 MG TABS Take 5,000 mg by mouth daily.   . DULoxetine (CYMBALTA) 30 MG capsule Take 1 capsule (30 mg total) by mouth daily.  . famotidine (PEPCID) 20 MG tablet Take 1 tablet (20 mg total) by mouth daily.  Marland Kitchen gabapentin (NEURONTIN) 400 MG capsule TAKE 2 CAPSULES BY MOUTH 3 TIMES DAILY.  . hydrochlorothiazide (HYDRODIURIL) 25 MG tablet Take 1 tablet (25 mg total) by mouth daily.  . hydrOXYzine (ATARAX/VISTARIL) 25 MG tablet Take 1 tablet (25 mg total) by mouth 3 (three) times daily as needed.  . metFORMIN (GLUCOPHAGE-XR) 500 MG 24 hr tablet Take 1 tablet (500 mg total) by mouth daily with  breakfast.  . [DISCONTINUED] ibuprofen (ADVIL,MOTRIN) 800 MG tablet TAKE 1 TABLET BY MOUTH EVERY 8 HOURS AS NEEDED.   No facility-administered encounter medications on file as of 06/09/2017.      Allergies:  Allergies  Allergen Reactions  . Amoxicillin   . Darvocet [Propoxyphene N-Acetaminophen]     Makes her jittery  . Hydrocodone-Acetaminophen     REACTION: Upset stomach  . Percocet [Oxycodone-Acetaminophen]     Makes her jittery  . Valium [Diazepam] Other (See Comments)    UNKNOWN Makes pt. Feel "out of wack"    Family History: Family History  Problem Relation Age of Onset  . Heart disease  Mother   . Asthma Sister   . Diabetes Sister   . COPD Sister   . Diabetes Sister   . Breast cancer Neg Hx   . Colon cancer Neg Hx     Social History: Social History   Tobacco Use  . Smoking status: Current Every Day Smoker    Packs/day: 1.00    Years: 29.00    Pack years: 29.00    Types: Cigarettes  . Smokeless tobacco: Never Used  . Tobacco comment: smokes 1-2 ppd  Substance Use Topics  . Alcohol use: No    Alcohol/week: 0.0 oz    Comment: Quit drinking in 2012.  . Drug use: No   Social History   Social History Narrative   Patient lives alone in an apartment on the first floor.  3 children.  Applying for disability.  Education: 8th or 9th grade.   Currently not working   Trying to go back to school online for [employer]   Six Grandchildren    Review of Systems:  CONSTITUTIONAL: No fevers, chills, night sweats, or weight loss.   EYES: No visual changes or eye pain ENT: No hearing changes.  No history of nose bleeds.   RESPIRATORY: No cough, wheezing and shortness of breath.   CARDIOVASCULAR: Negative for chest pain, and palpitations.   GI: Negative for abdominal discomfort, blood in stools or black stools.  No recent change in bowel habits.   GU:  No history of incontinence.   MUSCLOSKELETAL: +history of joint pain or swelling.  No myalgias.   SKIN: Negative for lesions, rash, and itching.   HEMATOLOGY/ONCOLOGY: Negative for prolonged bleeding, bruising easily, and swollen nodes.  No history of cancer.   ENDOCRINE: Negative for cold or heat intolerance, polydipsia or goiter.   PSYCH:  +depression or anxiety symptoms.   NEURO: As Above.   Vital Signs:  BP 120/84   Pulse 67   Ht 5\' 5"  (1.651 m)   Wt 164 lb 2 oz (74.4 kg)   LMP 05/08/2017 (Approximate)   SpO2 95%   BMI 27.31 kg/m    General Medical Exam:   General:  Comfortable, strong odor of tobacco Eyes/ENT: see cranial nerve examination.  Very poor dentition Neck: No masses appreciated.  Full range of motion  without tenderness.  No carotid bruits. Respiratory:  Clear to auscultation, good air entry bilaterally.   Cardiac:  Regular rate and rhythm, no murmur.   Extremities:  No deformities, edema, or skin discoloration.  Skin:  Right foot with dry ulcer at the 1st MTP on the sole.  Neurological Exam: MENTAL STATUS including orientation to time, place, person, recent and remote memory, attention span and concentration, language, and fund of knowledge is normal.  Speech is not dysarthric.  CRANIAL NERVES: II:  No visual field defects.  Unremarkable fundi.   III-IV-VI:  Pupils equal round and reactive to light.  Normal conjugate, extra-ocular eye movements in all directions of gaze.  No nystagmus.  No ptosis.   V:  Normal facial sensation.   VII:  Normal facial symmetry and movements.   VIII:  Normal hearing and vestibular function.   IX-X:  Normal palatal movement.   XI:  Normal shoulder shrug and head rotation.   XII:  Normal tongue strength and range of motion, no deviation or fasciculation.  MOTOR:  No atrophy, fasciculations or abnormal movements.  No pronator drift.  Tone is normal.    Right Upper Extremity:    Left Upper Extremity:    Deltoid  5/5   Deltoid  5/5   Biceps  5/5   Biceps  5/5   Triceps  5/5   Triceps  5/5   Wrist extensors  5/5   Wrist extensors  5/5   Wrist flexors  5/5   Wrist flexors  5/5   Finger extensors  5/5   Finger extensors  5/5   Finger flexors  5/5   Finger flexors  5/5   Dorsal interossei  5/5   Dorsal interossei  5/5   Abductor pollicis  5/5   Abductor pollicis  5/5   Tone (Ashworth scale)  0  Tone (Ashworth scale)  0   Right Lower Extremity:    Left Lower Extremity:    Hip flexors  5/5   Hip flexors  5/5   Hip extensors  5/5   Hip extensors  5/5   Knee flexors  5/5   Knee flexors  5/5   Knee extensors  5/5   Knee extensors  5/5   Dorsiflexors  5/5   Dorsiflexors  5/5   Plantarflexors  5/5   Plantarflexors  5/5   Toe extensors  5/5   Toe extensors   5/5   Toe flexors  5/5   Toe flexors  5/5   Tone (Ashworth scale)  0  Tone (Ashworth scale)  0   MSRs:  Right                                                                 Left brachioradialis 3+  brachioradialis 3+  biceps 3+  biceps 3+  triceps 3+  triceps 3+  patellar 3+  patellar 3+  ankle jerk 2+  ankle jerk 2+  Hoffman no  Hoffman no  plantar response down  plantar response down   SENSORY:  Vibration and temperature intact throughout; pin prick causes hyperesthesias over the feet.  Sensation is globally intact in the hands.  There is mild sway with Rhomberg testing.  COORDINATION/GAIT: Normal finger-to- nose-finger.  Intact rapid alternating movements bilaterally.  Gait is wide-based, slow, and assist with cane.  IMPRESSION: 1.  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  She has a long history of alcohol abuse and has been sober since 2013.  It was explained that her numbness is caused by neuropathy, but her sporadic whole-body pain does not fit the pattern of neuropathy.  To determine the extent of her neuropathy, she will have NCS/EMG of the right arm and leg.  I will also check labs for TSH, vitamin B1, vitamin B12,  folate, copper for other treatable causes of neuropathy.  2.  Sporadic whole-body shooting pain is less likely manifestation of neuropathy.  Because she has hyperreflexia, MRI cervical spine will be ordered to assess for compressive myelopathy.    3.  Chronic pain.  Recommend establishing care with pain management as she is already on three different medications used for neuropathic pain - gabapentin 800mg  TID, cymbalta 30mg  (depression), and amitriptyline 50mg  (depression).    4.  Tobacco abuse.  She is currently smoking 2 packs/day.  Patient was informed of the dangers of tobacco abuse including stroke, cancer, and MI, as well as benefits of tobacco cessation. She is willing to try to quit at this  time.  Approximately 5 mins were spent counseling patient cessation techniques. We discussed various methods to help quit smoking, including deciding on a date to quit, joining a support group, pharmacological agents- nicotine gum/patch/lozenges, chantix, etc.  I will reassess her progress at the next visit  Further recommendations will be based on her results.   Thank you for allowing me to participate in patient's care.  If I can answer any additional questions, I would be pleased to do so.    Sincerely,    Ronalda Walpole K. Posey Pronto, DO

## 2017-06-09 NOTE — Patient Instructions (Addendum)
MRI cervical spine without contrast  NCS/EMG of the right arm and leg  Check labs   Please follow-up with Pain Management  We will call you with the results and next step

## 2017-06-13 LAB — COPPER, SERUM: COPPER: 152 ug/dL (ref 70–175)

## 2017-06-13 LAB — VITAMIN B1: Vitamin B1 (Thiamine): 10 nmol/L (ref 8–30)

## 2017-06-22 ENCOUNTER — Ambulatory Visit (INDEPENDENT_AMBULATORY_CARE_PROVIDER_SITE_OTHER): Payer: Self-pay | Admitting: Neurology

## 2017-06-22 DIAGNOSIS — R292 Abnormal reflex: Secondary | ICD-10-CM

## 2017-06-22 DIAGNOSIS — E0842 Diabetes mellitus due to underlying condition with diabetic polyneuropathy: Secondary | ICD-10-CM

## 2017-06-22 DIAGNOSIS — G621 Alcoholic polyneuropathy: Secondary | ICD-10-CM

## 2017-06-22 DIAGNOSIS — Z720 Tobacco use: Secondary | ICD-10-CM

## 2017-06-22 NOTE — Procedures (Signed)
[facility]  [address]  [place], [place] [address] Tel: [phone] Fax:  [phone] Test Date:  06/22/2017  Patient: Kendra Griffith DOB: [birth year] Physician: Narda Amber, DO  Sex: Female Height: 5\' 5"  Ref Phys: Narda Amber, DO  ID#: [id number] Temp: 35.0C Technician:    Patient Complaints: This is a 52 year-old female with generalized paresthesias.   NCV & EMG Findings: Extensive electrodiagnostic testing of the right upper and lower extremity shows:  1. All sensory responses including the median, ulnar, sural, and superficial peroneal sensory responses are within normal limits. 2. All motor responses including the right median, ulnar, peroneal, and tibial are within normal limits. 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 3. There is no evidence of active or chronic motor axon loss changes affecting any of the tested muscles. Motor unit configuration and recruitment pattern is within normal limits.  Impression: This is a normal study of the right upper and lower extremities. In particular, there is no evidence of a large fiber sensorimotor polyneuropathy or cervical/lumbosacral radiculopathy.  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   ___________________________ Narda Amber, DO    Nerve Conduction Studies Anti Sensory Summary Table   Stim Site NR Peak (ms) Norm Peak (ms) P-T Amp (V) Norm P-T Amp  Right Median Anti Sensory (2nd Digit)  Wrist    2.6 <3.6 21.8 >15  Right Sup Peroneal Anti Sensory (Ant Lat Mall)  35C  12 cm    2.5 <4.6 14.8 >4  Right Sural Anti Sensory (Lat Mall)  35C  Calf    3.1 <4.6 13.7 >4  Right Ulnar Anti Sensory (5th Digit)  35C  Wrist    2.6 <3.1 34.8 >10   Motor Summary Table   Stim Site NR Onset (ms) Norm Onset (ms) O-P  Amp (mV) Norm O-P Amp Site1 Site2 Delta-0 (ms) Dist (cm) Vel (m/s) Norm Vel (m/s)  Right Median Motor (Abd Poll Brev)  35C  Wrist    2.1 <4.0 9.2 >6 Elbow Wrist 5.9 32.0 54 >50  Elbow    8.0  10.6  Ulnar-wrist crossover Elbow 4.5 0.0    Ulnar-wrist crossover    3.5  3.5         Right Peroneal Motor (Ext Dig Brev)  35C  Ankle    2.9 <6.0 5.7 >2.5 B Fib Ankle 7.0 37.0 53 >40  B Fib    9.9  5.1  Poplt B Fib 1.3 10.0 77 >40  Poplt    11.2  4.9         Right Tibial Motor (Abd Hall Brev)  35C  Ankle    3.0 <6.0 5.7 >4 Knee Ankle 8.3 37.0 45 >40  Knee    11.3  5.3         Right Ulnar Motor (Abd Dig Minimi)  35C  Wrist    0.9 <3.1 11.0 >7 B Elbow Wrist 4.3 23.0 53 >50  B Elbow    5.2  10.0  A Elbow B Elbow 1.9 0.0  >50  A Elbow    7.1  10.0          Comparison Summary Table   Stim Site NR Peak (ms) Norm Peak (ms) P-T Amp (V) Site1 Site2 Delta-P (ms) Norm Delta (ms)  Right Median/Ulnar Palm Comparison (Wrist - 8cm)  35C  Median Palm    1.6 <2.2 38.8 Median Palm Ulnar Palm 0.3   Ulnar Palm    1.3 <2.2 9.7       H Reflex Studies   NR H-Lat (ms) Lat Norm (ms) L-R H-Lat (ms)  Right Tibial (Gastroc)  35C     29.80 <35    EMG   Side Muscle Ins Act Fibs Psw Fasc Number Recrt Dur Dur. Amp Amp. Poly Poly. Comment  Right AntTibialis Nml Nml Nml Nml Nml Nml Nml Nml Nml Nml Nml Nml N/A  Right Gastroc Nml Nml Nml Nml Nml Nml Nml Nml Nml Nml Nml Nml N/A  Right RectFemoris Nml Nml Nml Nml Nml Nml Nml Nml Nml Nml Nml Nml N/A  Right GluteusMed Nml Nml Nml Nml Nml Nml Nml Nml Nml Nml Nml Nml N/A  Right BicepsFemS Nml Nml Nml Nml Nml Nml Nml Nml Nml Nml Nml Nml N/A  Right 1stDorInt Nml Nml Nml Nml Nml Nml Nml Nml Nml Nml Nml Nml N/A  Right PronatorTeres Nml Nml Nml Nml Nml Nml Nml Nml Nml Nml Nml Nml N/A  Right Biceps Nml Nml Nml Nml Nml Nml Nml Nml Nml Nml Nml Nml N/A  Right Triceps Nml Nml Nml Nml Nml Nml Nml Nml Nml Nml Nml Nml N/A  Right Deltoid Nml Nml Nml Nml Nml Nml Nml Nml Nml Nml Nml  Nml N/A      Waveforms:

## 2017-06-23 ENCOUNTER — Other Ambulatory Visit: Payer: Self-pay | Admitting: *Deleted

## 2017-06-23 ENCOUNTER — Telehealth: Payer: Self-pay | Admitting: *Deleted

## 2017-06-23 DIAGNOSIS — R292 Abnormal reflex: Secondary | ICD-10-CM

## 2017-06-23 NOTE — Telephone Encounter (Signed)
-----   Message from Alda Berthold, DO sent at 06/23/2017 11:36 AM EDT ----- Please inform pt that her nerve testing was normal and does not explain her shooting pain, so I would like to get MRI cervical spine wo contrast. Please order, if she is agreeable. Thanks.

## 2017-06-23 NOTE — Telephone Encounter (Signed)
Left message giving patient results. She already has MRI set up for 06-25-17.

## 2017-06-25 ENCOUNTER — Ambulatory Visit
Admission: RE | Admit: 2017-06-25 | Discharge: 2017-06-25 | Disposition: A | Discharge status: Home / Self Care | Payer: No Typology Code available for payment source | Source: Ambulatory Visit | Attending: Neurology | Admitting: Neurology

## 2017-06-25 DIAGNOSIS — E0842 Diabetes mellitus due to underlying condition with diabetic polyneuropathy: Secondary | ICD-10-CM

## 2017-06-25 DIAGNOSIS — G621 Alcoholic polyneuropathy: Secondary | ICD-10-CM

## 2017-06-25 DIAGNOSIS — R292 Abnormal reflex: Secondary | ICD-10-CM

## 2017-06-25 DIAGNOSIS — Z720 Tobacco use: Secondary | ICD-10-CM

## 2017-06-27 ENCOUNTER — Telehealth: Payer: Self-pay | Admitting: *Deleted

## 2017-06-27 NOTE — Telephone Encounter (Signed)
-----   Message from Alda Berthold, DO sent at 06/26/2017  9:02 AM EDT ----- Please inform patient that she has mild age-related changes and spinal stenosis in the neck.  Recommend starting physical therapy and see if that helps her symptoms.  Thanks

## 2017-06-27 NOTE — Telephone Encounter (Signed)
Left message for patient to call me back. 

## 2017-06-28 ENCOUNTER — Telehealth: Payer: Self-pay | Admitting: *Deleted

## 2017-06-28 ENCOUNTER — Encounter: Payer: Self-pay | Admitting: *Deleted

## 2017-06-28 NOTE — Telephone Encounter (Signed)
-----   Message from Alda Berthold, DO sent at 06/26/2017  9:02 AM EDT ----- Please inform patient that she has mild age-related changes and spinal stenosis in the neck.  Recommend starting physical therapy and see if that helps her symptoms.  Thanks

## 2017-06-28 NOTE — Telephone Encounter (Signed)
Letter sent giving results and instructions.

## 2017-07-06 ENCOUNTER — Telehealth: Payer: Self-pay | Admitting: Neurology

## 2017-07-06 NOTE — Telephone Encounter (Signed)
Patient notified of EM[facility] and [facility] appointments.

## 2017-07-06 NOTE — Telephone Encounter (Signed)
Last message entered in error.

## 2017-07-06 NOTE — Telephone Encounter (Signed)
I called patient back and informed her that she is not a candidate for B12 injections since her B12 is high.  Referral sent for PT.  ([facility])

## 2017-07-06 NOTE — Telephone Encounter (Signed)
Patient called to let you know that she would like to start PT and also is she a candidate for B12 Injections? Please Call. Thanks

## 2017-07-11 ENCOUNTER — Other Ambulatory Visit: Payer: Self-pay | Admitting: *Deleted

## 2017-07-11 DIAGNOSIS — G621 Alcoholic polyneuropathy: Secondary | ICD-10-CM

## 2017-07-17 ENCOUNTER — Telehealth: Payer: Self-pay | Admitting: Neurology

## 2017-07-17 ENCOUNTER — Other Ambulatory Visit: Payer: Self-pay | Admitting: *Deleted

## 2017-07-17 DIAGNOSIS — G629 Polyneuropathy, unspecified: Secondary | ICD-10-CM

## 2017-07-17 DIAGNOSIS — M79672 Pain in left foot: Secondary | ICD-10-CM

## 2017-07-17 DIAGNOSIS — M79671 Pain in right foot: Secondary | ICD-10-CM

## 2017-07-17 NOTE — Telephone Encounter (Signed)
Patient called needing to check status of Referral. Thanks

## 2017-07-17 NOTE — Telephone Encounter (Signed)
I called [facility] and patient does not have insurance.  Informed patient that I will send new referral to [facility] PT.

## 2017-08-01 ENCOUNTER — Encounter: Payer: Self-pay | Admitting: Nurse Practitioner

## 2017-08-01 ENCOUNTER — Ambulatory Visit: Payer: Self-pay | Attending: Nurse Practitioner | Admitting: Nurse Practitioner

## 2017-08-01 VITALS — BP 130/89 | HR 86 | Temp 99.2°F | Ht 65.0 in | Wt 163.8 lb

## 2017-08-01 DIAGNOSIS — M79672 Pain in left foot: Secondary | ICD-10-CM

## 2017-08-01 DIAGNOSIS — F329 Major depressive disorder, single episode, unspecified: Secondary | ICD-10-CM | POA: Insufficient documentation

## 2017-08-01 DIAGNOSIS — Z885 Allergy status to narcotic agent status: Secondary | ICD-10-CM | POA: Insufficient documentation

## 2017-08-01 DIAGNOSIS — Z79899 Other long term (current) drug therapy: Secondary | ICD-10-CM | POA: Insufficient documentation

## 2017-08-01 DIAGNOSIS — K089 Disorder of teeth and supporting structures, unspecified: Secondary | ICD-10-CM

## 2017-08-01 DIAGNOSIS — Z880 Allergy status to penicillin: Secondary | ICD-10-CM | POA: Insufficient documentation

## 2017-08-01 DIAGNOSIS — I10 Essential (primary) hypertension: Secondary | ICD-10-CM

## 2017-08-01 DIAGNOSIS — F419 Anxiety disorder, unspecified: Secondary | ICD-10-CM | POA: Insufficient documentation

## 2017-08-01 DIAGNOSIS — M79671 Pain in right foot: Secondary | ICD-10-CM

## 2017-08-01 DIAGNOSIS — D573 Sickle-cell trait: Secondary | ICD-10-CM

## 2017-08-01 DIAGNOSIS — Z7984 Long term (current) use of oral hypoglycemic drugs: Secondary | ICD-10-CM | POA: Insufficient documentation

## 2017-08-01 DIAGNOSIS — Z760 Encounter for issue of repeat prescription: Secondary | ICD-10-CM | POA: Insufficient documentation

## 2017-08-01 DIAGNOSIS — Z7689 Persons encountering health services in other specified circumstances: Secondary | ICD-10-CM | POA: Insufficient documentation

## 2017-08-01 DIAGNOSIS — E1142 Type 2 diabetes mellitus with diabetic polyneuropathy: Secondary | ICD-10-CM

## 2017-08-01 DIAGNOSIS — G894 Chronic pain syndrome: Secondary | ICD-10-CM

## 2017-08-01 DIAGNOSIS — M546 Pain in thoracic spine: Secondary | ICD-10-CM | POA: Insufficient documentation

## 2017-08-01 LAB — GLUCOSE, POCT (MANUAL RESULT ENTRY): POC Glucose: 98 mg/dl (ref 70–99)

## 2017-08-01 MED ORDER — HYDROCHLOROTHIAZIDE 25 MG PO TABS: 25.0000 mg | ORAL_TABLET | Freq: Every day | ORAL | 3 refills | Status: DC

## 2017-08-01 MED ORDER — ACETAMINOPHEN-CODEINE #3 300-30 MG PO TABS: 1.0000 | ORAL_TABLET | ORAL | 0 refills | Status: DC | PRN

## 2017-08-01 MED ORDER — IBUPROFEN 800 MG PO TABS: 800.0000 mg | ORAL_TABLET | Freq: Three times a day (TID) | ORAL | 2 refills | Status: DC | PRN

## 2017-08-01 MED FILL — ACETAMINOPHEN/COD #3 TABLET: 300-30 | 10 days supply | Qty: 60 | Fill #0

## 2017-08-01 MED FILL — HYDROCHLOROTHIAZIDE 25 MG T: 25 | 30 days supply | Qty: 30 | Fill #3

## 2017-08-01 NOTE — Patient Instructions (Signed)
Arthritis Arthritis means joint pain. It can also mean joint disease. A joint is a place where bones come together. People who have arthritis may have:  Red joints.  Swollen joints.  Stiff joints.  Warm joints.  A fever.  A feeling of being sick.  Follow these instructions at home: Pay attention to any changes in your symptoms. Take these actions to help with your pain and swelling. Medicines  Take over-the-counter and prescription medicines only as told by your doctor.  Do not take aspirin for pain if your doctor says that you may have gout. Activity  Rest your joint if your doctor tells you to.  Avoid activities that make the pain worse.  Exercise your joint regularly as told by your doctor. Try doing exercises like: ? Swimming. ? Water aerobics. ? Biking. ? Walking. Joint Care   If your joint is swollen, keep it raised (elevated) if told by your doctor.  If your joint feels stiff in the morning, try taking a warm shower.  If you have diabetes, do not apply heat without asking your doctor.  If told, apply heat to the joint: ? Put a towel between the joint and the hot pack or heating pad. ? Leave the heat on the area for 20-30 minutes.  If told, apply ice to the joint: ? Put ice in a plastic bag. ? Place a towel between your skin and the bag. ? Leave the ice on for 20 minutes, 2-3 times per day.  Keep all follow-up visits as told by your doctor. Contact a doctor if:  The pain gets worse.  You have a fever. Get help right away if:  You have very bad pain in your joint.  You have swelling in your joint.  Your joint is red.  Many joints become painful and swollen.  You have very bad back pain.  Your leg is very weak.  You cannot control your pee (urine) or poop (stool). This information is not intended to replace advice given to you by your health care provider. Make sure you discuss any questions you have with your health care provider. Document  Released: 06/22/2009 Document Revised: 09/03/2015 Document Reviewed: 06/23/2014 Elsevier Interactive Patient Education  2018 [place].  Rheumatoid Arthritis Rheumatoid arthritis (RA) is a long-term (chronic) disease that causes inflammation in your joints. RA may start slowly. It usually affects the small joints of the hands and feet. Usually, the same joints are affected on both sides of your body. Inflammation from RA can also affect other parts of your body, including your heart, eyes, or lungs. RA is an autoimmune disease. That means that your body's defense system (immune system) mistakenly attacks healthy body tissues. There is no cure for RA, but medicines can help your symptoms and halt or slow down the progression of the disease. What are the causes? The exact cause of RA is not known. What increases the risk? This condition is more likely to develop in:  Women.  People who have a family history of RA or other autoimmune diseases.  What are the signs or symptoms? Symptoms of this condition vary from person to person. Symptoms usually start gradually. They are often worse in the morning. The first symptom may be morning stiffness that lasts longer than 30 minutes. As RA progresses, symptoms may include:  Pain, stiffness, swelling, warmth, and tenderness in joints on both sides of your body.  Loss of energy.  Loss of appetite.  Weight loss.  Low-grade fever.  Dry eyes and dry mouth.  Firm lumps (rheumatoid nodules) that grow beneath your skin in areas such as your forearm bones near your elbows and on your hands.  Changes in the appearance of joints (deformity) and loss of joint function.  Symptoms of RA often come and go. Sometimes, symptoms get worse for a period of time. These are called flares. How is this diagnosed? This condition is diagnosed based on your symptoms, medical history, and physical exam. You may have X-rays or MRI to check for the type of joint  changes that are caused by RA. You may also have blood tests to look for:  Proteins (antibodies) that your immune system may make if you have RA. They include rheumatoid factor (RF) and anti-CCP. ? When blood tests show these proteins, you are said to have "seropositive RA." ? When blood tests do not show these proteins, you may have "seronegative RA."  Inflammation in your blood.  A low number of red blood cells (anemia).  How is this treated? The goals of treatment are to relieve pain, reduce inflammation, and slow down or stop joint damage and disability. Treatment may include:  Lifestyle changes. It is important to rest, eat a healthy diet, and exercise.  Medicines. Your health care provider may adjust your medicines every 3 months until treatment goals are reached. Common medicines include: ? Pain relievers (analgesics). ? Corticosteroids and NSAIDs to reduce inflammation. ? Disease-modifying antirheumatic drugs (DMARDs) to try to slow the course of the disease. ? Biologic response modifiers to reduce inflammation and damage.  Physical therapy and occupational therapy.  Surgery, if you have severe joint damage. Joint replacement or fusing of joints may be needed.  Your health care provider will work with you to identify the best treatment option for you based on assessment of the overall disease activity in your body. Follow these instructions at home:  Take over-the-counter and prescription medicines only as told by your health care provider.  Start an exercise program as told by your health care provider.  Rest when you are having a flare.  Return to your normal activities as told by your health care provider. Ask your health care provider what activities are safe for you.  Keep all follow-up visits as told by your health care provider. This is important. Where to find more information:  [employer]: www.rheumatology.[place]:  www.arthritis.org Contact a health care provider if:  You have a flare-up of RA symptoms.  You have a fever.  You have side effects from your medicines. Get help right away if:  You have chest pain.  You have trouble breathing.  You quickly develop a hot, painful joint that is more severe than your usual joint aches. This information is not intended to replace advice given to you by your health care provider. Make sure you discuss any questions you have with your health care provider. Document Released: 03/25/2000 Document Revised: 09/01/2015 Document Reviewed: 01/08/2015 Elsevier Interactive Patient Education  2018 [place].  Rheumatoid Arthritis Rheumatoid arthritis (RA) is a long-term (chronic) disease that causes inflammation in your joints. RA may start slowly. It usually affects the small joints of the hands and feet. Usually, the same joints are affected on both sides of your body. Inflammation from RA can also affect other parts of your body, including your heart, eyes, or lungs. RA is an autoimmune disease. That means that your body's defense system (immune system) mistakenly attacks healthy body tissues. There is no  cure for RA, but medicines can help your symptoms and halt or slow down the progression of the disease. What are the causes? The exact cause of RA is not known. What increases the risk? This condition is more likely to develop in:  Women.  People who have a family history of RA or other autoimmune diseases.  What are the signs or symptoms? Symptoms of this condition vary from person to person. Symptoms usually start gradually. They are often worse in the morning. The first symptom may be morning stiffness that lasts longer than 30 minutes. As RA progresses, symptoms may include:  Pain, stiffness, swelling, warmth, and tenderness in joints on both sides of your body.  Loss of energy.  Loss of appetite.  Weight loss.  Low-grade fever.  Dry eyes  and dry mouth.  Firm lumps (rheumatoid nodules) that grow beneath your skin in areas such as your forearm bones near your elbows and on your hands.  Changes in the appearance of joints (deformity) and loss of joint function.  Symptoms of RA often come and go. Sometimes, symptoms get worse for a period of time. These are called flares. How is this diagnosed? This condition is diagnosed based on your symptoms, medical history, and physical exam. You may have X-rays or MRI to check for the type of joint changes that are caused by RA. You may also have blood tests to look for:  Proteins (antibodies) that your immune system may make if you have RA. They include rheumatoid factor (RF) and anti-CCP. ? When blood tests show these proteins, you are said to have "seropositive RA." ? When blood tests do not show these proteins, you may have "seronegative RA."  Inflammation in your blood.  A low number of red blood cells (anemia).  How is this treated? The goals of treatment are to relieve pain, reduce inflammation, and slow down or stop joint damage and disability. Treatment may include:  Lifestyle changes. It is important to rest, eat a healthy diet, and exercise.  Medicines. Your health care provider may adjust your medicines every 3 months until treatment goals are reached. Common medicines include: ? Pain relievers (analgesics). ? Corticosteroids and NSAIDs to reduce inflammation. ? Disease-modifying antirheumatic drugs (DMARDs) to try to slow the course of the disease. ? Biologic response modifiers to reduce inflammation and damage.  Physical therapy and occupational therapy.  Surgery, if you have severe joint damage. Joint replacement or fusing of joints may be needed.  Your health care provider will work with you to identify the best treatment option for you based on assessment of the overall disease activity in your body. Follow these instructions at home:  Take over-the-counter and  prescription medicines only as told by your health care provider.  Start an exercise program as told by your health care provider.  Rest when you are having a flare.  Return to your normal activities as told by your health care provider. Ask your health care provider what activities are safe for you.  Keep all follow-up visits as told by your health care provider. This is important. Where to find more information:  [employer]: www.rheumatology.[place]: www.arthritis.org Contact a health care provider if:  You have a flare-up of RA symptoms.  You have a fever.  You have side effects from your medicines. Get help right away if:  You have chest pain.  You have trouble breathing.  You quickly develop a hot, painful joint that is more severe  than your usual joint aches. This information is not intended to replace advice given to you by your health care provider. Make sure you discuss any questions you have with your health care provider. Document Released: 03/25/2000 Document Revised: 09/01/2015 Document Reviewed: 01/08/2015 Elsevier Interactive Patient Education  [employer].

## 2017-08-01 NOTE — Progress Notes (Signed)
Assessment & Plan:  Kendra Griffith was seen today for establish care, referral and medication refill.  Diagnoses and all orders for this visit:  Type 2 diabetes mellitus with diabetic polyneuropathy, without long-term current use of insulin (HCC) -     Glucose (CBG) Continue blood sugar control as discussed in office today, low carbohydrate diet, and regular physical exercise as tolerated, 150 minutes per week (30 min each day, 5 days per week, or 50 min 3 days per week). Keep blood sugar logs with fasting goal of 80-130 mg/dl, post prandial less than 180.  For Hypoglycemia: BS <60 and Hyperglycemia BS >400; contact the clinic ASAP. Annual eye exams and foot exams are recommended.   Bilateral foot pain -     Ambulatory referral to Podiatry  Sickle-cell trait (Pump Back) Stable.    Chronic pain syndrome -     acetaminophen-codeine (TYLENOL #3) 300-30 MG tablet; Take 1 tablet by mouth every 4 (four) hours as needed. -     ibuprofen (ADVIL,MOTRIN) 800 MG tablet; Take 1 tablet (800 mg total) by mouth every 8 (eight) hours as needed. -     ToxASSURE Select 13 (MW), Urine -     Rheumatoid factor -     CYCLIC CITRUL PEPTIDE ANTIBODY, IGG/IGA -     Sedimentation Rate -     C-reactive protein -     ANA+ENA+DNA/DS+Antich+Centr -     Anti-Smith antibody  Essential hypertension, benign -     hydrochlorothiazide (HYDRODIURIL) 25 MG tablet; Take 1 tablet (25 mg total) by mouth daily. Continue all antihypertensives as prescribed.  Remember to bring in your blood pressure log with you for your follow up appointment.  DASH/Mediterranean Diets are healthier choices for HTN.    Poor dentition -     Ambulatory referral to Dentistry    Patient has been counseled on age-appropriate routine health concerns for screening and prevention. These are reviewed and up-to-date. Referrals have been placed accordingly. Immunizations are up-to-date or declined.    Subjective:   Chief Complaint  Patient presents  with  . Establish Care    Pt. is here to establish care for prediabetes and hypertension.   . Referral    Pt. would like a referral for rheumatologist for back pain and podiatry for both of her foot.   . Medication Refill   HPI Kendra Griffith 52 y.o. female presents to office today to establish care for prediabetes and HTN. She would like to be referred to podiatry. She had 2 foot surgeries over 15 years ago. She has numerous callouses and chronic foot pain. She also has chronic thoracic back pain (over 3 years ago) and neck stiffness. She would like to be referred to rheumatology as she thinks she may have rheumatoid arthritis and states she has a significant family history of OA and RA. I have instructed her that we will need to do additional testing to determine if she has RA or an autoimmune disease prior to referring her to rheumatology. She also states she had a tooth extracted by a dentist and reports she still has some of the remaining tooth left in the same area and would like to be referred to dentistry. She denies any symptoms of infection; fever, pain, drainage.   Diabetes Mellitus Type 2 Chronic. Diagnosed with diabetes in 2013 however labs over the past several years her A1c has been 6.3 consistently. She does not check her blood sugars at home. She takes metformin.  Lab Results  Component  Value Date   HGBA1C 6.3 05/17/2017    Neuropathy Chronic. Onset over 20 years ago. Numbness involves the soles of her feet. She also has a history of heavy alcohol abuse since childhood. Her neuropathy is likely from the alcohol abuse as her a1c is in prediabetes range for the past several years.  She endorses being sober for over 6 years. She sees neurology and takes gabapentin for neuropathy as well as tylenol #3.  She has been referred to pain management in the past. She is currently seeing Neurology and has physical therapy scheduled to start at the end of this month.     Essential  Hypertension Chronic. Stable. Her blood pressure is well controlled. Denies chest pain, shortness of breath, palpitations, lightheadedness, dizziness, headaches or BLE edema. Takes HCTZ as prescribed.  BP Readings from Last 3 Encounters:  08/01/17 130/89  06/09/17 120/84  06/08/17 116/80   Anxiety and Depression She has an appointment with mental health services [facility] in the next upcoming weeks. She states she has moments of mania and often pulls her hair out. She goes to [facility] once a month. She endorses auditory hallucinations at times and denies any current thoughts of suicidal ideation. Taking Amitriptyline and Cymbalta for mood disorder.    Review of Systems  Constitutional: Negative for fever, malaise/fatigue and weight loss.  HENT: Negative.  Negative for nosebleeds.   Eyes: Negative.  Negative for blurred vision, double vision and photophobia.  Respiratory: Negative.  Negative for cough and shortness of breath.   Cardiovascular: Negative.  Negative for chest pain, palpitations and leg swelling.  Gastrointestinal: Negative.  Negative for heartburn, nausea and vomiting.  Musculoskeletal: Positive for back pain, falls, joint pain, myalgias and neck pain.  Neurological: Positive for tingling, sensory change, focal weakness and weakness. Negative for dizziness, seizures and headaches.  Psychiatric/Behavioral: Positive for depression and hallucinations. Negative for substance abuse and suicidal ideas. The patient is nervous/anxious. The patient does not have insomnia.     Past Medical History:  Diagnosis Date  . Anemia   . Anxiety   . Blood transfusion without reported diagnosis    childbirth  . Clotting disorder ([place])    childbirth  . Depression   . Diabetes mellitus without complication (HCC)    prediabetes  . Hypertension   . Injury    right side from jumping off bunkbed  . Pre-diabetes     Past Surgical History:  Procedure Laterality Date  . CESAREAN SECTION      three  . cyst removal 1997    . FACIAL RECONSTRUCTION SURGERY    . FOOT SURGERY Bilateral     Family History  Problem Relation Age of Onset  . Heart disease Mother   . Asthma Sister   . Diabetes Sister   . COPD Sister   . Diabetes Sister   . Diabetes Sister   . Breast cancer Neg Hx   . Colon cancer Neg Hx     Social History Reviewed with no changes to be made today.   Outpatient Medications Prior to Visit  Medication Sig Dispense Refill  . amitriptyline (ELAVIL) 50 MG tablet Take 1 tablet (50 mg total) by mouth at bedtime. 30 tablet 3  . Biotin (BIOTIN MAXIMUM STRENGTH) 10 MG TABS Take 5,000 mg by mouth daily.     . DULoxetine (CYMBALTA) 30 MG capsule Take 1 capsule (30 mg total) by mouth daily. 30 capsule 3  . famotidine (PEPCID) 20 MG tablet Take 1 tablet (20 mg  total) by mouth daily. 60 tablet 3  . gabapentin (NEURONTIN) 400 MG capsule TAKE 2 CAPSULES BY MOUTH 3 TIMES DAILY. 180 capsule 3  . hydrOXYzine (ATARAX/VISTARIL) 25 MG tablet Take 1 tablet (25 mg total) by mouth 3 (three) times daily as needed. 60 tablet 3  . metFORMIN (GLUCOPHAGE-XR) 500 MG 24 hr tablet Take 1 tablet (500 mg total) by mouth daily with breakfast. 90 tablet 3  . acetaminophen-codeine (TYLENOL #3) 300-30 MG tablet Take 1 tablet by mouth every 4 (four) hours as needed. 60 tablet 0  . hydrochlorothiazide (HYDRODIURIL) 25 MG tablet Take 1 tablet (25 mg total) by mouth daily. 90 tablet 3  . ibuprofen (ADVIL,MOTRIN) 800 MG tablet TAKE 1 TABLET BY MOUTH EVERY 8 HOURS AS NEEDED. 60 tablet 0   No facility-administered medications prior to visit.     Allergies  Allergen Reactions  . Amoxicillin   . Darvocet [Propoxyphene N-Acetaminophen]     Makes her jittery  . Hydrocodone-Acetaminophen     REACTION: Upset stomach  . Percocet [Oxycodone-Acetaminophen]     Makes her jittery  . Valium [Diazepam] Other (See Comments)    UNKNOWN Makes pt. Feel "out of wack"       Objective:    BP 130/89 (BP Location:  Left Arm, Patient Position: Sitting, Cuff Size: Normal)   Pulse 86   Temp 99.2 F (37.3 C) (Oral)   Ht 5\' 5"  (1.651 m)   Wt 163 lb 12.8 oz (74.3 kg)   LMP 05/08/2017 (Approximate)   SpO2 100%   Breastfeeding? Unknown   BMI 27.26 kg/m  Wt Readings from Last 3 Encounters:  08/01/17 163 lb 12.8 oz (74.3 kg)  06/09/17 164 lb 2 oz (74.4 kg)  04/13/17 165 lb (74.8 kg)    Physical Exam  Constitutional: She is oriented to person, place, and time. She appears well-developed and well-nourished. She is cooperative.  HENT:  Head: Normocephalic and atraumatic.  Eyes: EOM are normal.  Neck: Normal range of motion.  Cardiovascular: Normal rate, regular rhythm and normal heart sounds. Exam reveals no gallop and no friction rub.  No murmur heard. Pulmonary/Chest: Effort normal and breath sounds normal. No tachypnea. No respiratory distress. She has no decreased breath sounds. She has no wheezes. She has no rhonchi. She has no rales. She exhibits no tenderness.  Abdominal: Soft. Bowel sounds are normal.  Musculoskeletal: Normal range of motion. She exhibits no edema.       Cervical back: She exhibits pain.       Thoracic back: She exhibits pain.       Lumbar back: She exhibits pain.  Neurological: She is alert and oriented to person, place, and time. Gait (using walking cane) abnormal. Coordination normal.  Skin: Skin is warm and dry.  Psychiatric: She has a normal mood and affect. Her behavior is normal. Judgment and thought content normal.  Nursing note and vitals reviewed.        Patient has been counseled extensively about nutrition and exercise as well as the importance of adherence with medications and regular follow-up. The patient was given clear instructions to go to ER or return to medical center if symptoms don't improve, worsen or new problems develop. The patient verbalized understanding.   Follow-up: Return in about 3 months (around 10/31/2017).   [name], FNP-BC [facility] and [place], [place]   08/01/2017, 2:53 PM

## 2017-08-02 ENCOUNTER — Telehealth: Payer: Self-pay | Admitting: Nurse Practitioner

## 2017-08-02 NOTE — Telephone Encounter (Signed)
Pt called to call to change the referral from a Podiatry to an orthopedic specialist, please follow up

## 2017-08-03 LAB — ANA+ENA+DNA/DS+ANTICH+CENTR
ANA TITER 1: NEGATIVE
ENA RNP Ab: <0.2 AI (ref 0.0–0.9)
ENA SSA (RO) Ab: <0.2 AI (ref 0.0–0.9)
Scleroderma SCL-70: <0.2 AI (ref 0.0–0.9)
dsDNA Ab: <1 IU/mL (ref 0–9)

## 2017-08-03 LAB — C-REACTIVE PROTEIN: CRP: 2.1 mg/L (ref 0.0–4.9)

## 2017-08-03 LAB — CYCLIC CITRUL PEPTIDE ANTIBODY, IGG/IGA: CYCLIC CITRULLIN PEPTIDE AB: 7 U (ref 0–19)

## 2017-08-03 LAB — RHEUMATOID FACTOR

## 2017-08-03 LAB — SEDIMENTATION RATE: SED RATE: 9 mm/h (ref 0–40)

## 2017-08-04 NOTE — Telephone Encounter (Signed)
I called Pt and explain the respond of the pcp

## 2017-08-04 NOTE — Telephone Encounter (Signed)
Regarding her feet. Podiatry will see her first and determine if she needs orthopedics.

## 2017-08-05 LAB — TOXASSURE SELECT 13 (MW), URINE

## 2017-08-07 ENCOUNTER — Encounter: Payer: Self-pay | Admitting: Rehabilitation

## 2017-08-07 ENCOUNTER — Ambulatory Visit: Payer: No Typology Code available for payment source | Attending: Neurology | Admitting: Rehabilitation

## 2017-08-07 ENCOUNTER — Other Ambulatory Visit: Payer: Self-pay

## 2017-08-07 DIAGNOSIS — M545 Low back pain, unspecified: Secondary | ICD-10-CM

## 2017-08-07 DIAGNOSIS — G8929 Other chronic pain: Secondary | ICD-10-CM | POA: Insufficient documentation

## 2017-08-07 DIAGNOSIS — R2681 Unsteadiness on feet: Secondary | ICD-10-CM | POA: Insufficient documentation

## 2017-08-07 DIAGNOSIS — R2689 Other abnormalities of gait and mobility: Secondary | ICD-10-CM | POA: Insufficient documentation

## 2017-08-07 DIAGNOSIS — M6281 Muscle weakness (generalized): Secondary | ICD-10-CM

## 2017-08-07 DIAGNOSIS — R208 Other disturbances of skin sensation: Secondary | ICD-10-CM | POA: Insufficient documentation

## 2017-08-07 NOTE — Therapy (Signed)
[facility] [address] [place], [place], [address] Phone: [phone]   Fax:  [phone]  Physical Therapy Evaluation  Patient Details  Name: Kendra Griffith MRN: [id number] Date of Birth: 06/16/1965 Referring Provider: Narda Amber, MD   Encounter Date: 08/07/2017  PT End of Session - 08/07/17 1310    Visit Number  1    Number of Visits  17    Date for PT Re-Evaluation  10/06/17    Authorization Type  CAFA approved 100% discount    PT Start Time  1017    PT Stop Time  1100    PT Time Calculation (min)  43 min    Activity Tolerance  Patient tolerated treatment well    Behavior During Therapy  [facility] for tasks assessed/performed       Past Medical History:  Diagnosis Date  . Anemia   . Anxiety   . Blood transfusion without reported diagnosis    childbirth  . Clotting disorder ([place])    childbirth  . Depression   . Diabetes mellitus without complication (HCC)    prediabetes  . Hypertension   . Injury    right side from jumping off bunkbed  . Pre-diabetes     Past Surgical History:  Procedure Laterality Date  . CESAREAN SECTION     three  . cyst removal 1997    . FACIAL RECONSTRUCTION SURGERY    . FOOT SURGERY Bilateral     There were no vitals filed for this visit.   Subjective Assessment - 08/07/17 1017    Subjective  "I have chronic pain in both feet.  We are trying to figure out what is going on and why they hurt.  I am pre-diabetic but not diabetic.  I have low back pain that flared up this weekend.  When they did EMG last year, they didn't find anything, so I was very discouraged about that.      Limitations  House hold activities;Walking;Standing    How long can you stand comfortably?  2 1/2 hours (back and feet pain)    How long can you walk comfortably?  2 hours    Patient Stated Goals  "I want to roller skate with my grandkids."     Currently in Pain?  Yes    Pain Score  8     Pain  Location  Foot    Pain Orientation  Right;Left    Pain Descriptors / Indicators  Throbbing;Stabbing;Radiating    Pain Onset  More than a month ago    Pain Frequency  Constant    Aggravating Factors   standing, walking    Pain Relieving Factors  getting off of feet, massage    Multiple Pain Sites  Yes    Pain Score  8    Pain Location  Back    Pain Orientation  Right;Left;Lower    Pain Descriptors / Indicators  Aching;Burning    Pain Type  Chronic pain    Pain Onset  More than a month ago    Pain Frequency  Constant    Aggravating Factors   being up walking, bending, reaching    Pain Relieving Factors  rest, pain medication          [facility] PT Assessment - 08/07/17 1026      Assessment   Medical Diagnosis  polyneuropathy    Referring Provider  Narda Amber, MD    Onset Date/Surgical Date  -- Began having issues  with feet following B foot sx 2001      Precautions   Precautions  Fall      Balance Screen   Has the patient fallen in the past 6 months  Yes    How many times?  2    Has the patient had a decrease in activity level because of a fear of falling?   Yes    Is the patient reluctant to leave their home because of a fear of falling?   Yes      [place]  Private residence    Living Arrangements  Alone;Children    Available Help at Discharge  Available PRN/intermittently    Type of Home  Apartment    Home Access  Level entry    [place] - single point;Walker - 2 wheels      Prior Function   Level of Independence  Independent with household mobility with device;Independent with community mobility with device    Leisure  Spend time with grandkids, going to park      Sensation   Light Touch  Impaired Detail    Light Touch Impaired Details  Impaired RLE;Impaired LLE ankle and whole foot    Hot/Cold  Impaired Detail    Hot/Cold Impaired Details  Impaired RLE;Impaired LLE feet       Coordination    Gross Motor Movements are Fluid and Coordinated  Yes    Fine Motor Movements are Fluid and Coordinated  Yes      ROM / Strength   AROM / PROM / Strength  Strength      Strength   Overall Strength  Deficits    Strength Assessment Site  Hip;Knee;Ankle    Right/Left Hip  Right;Left    Right Hip Flexion  3/5    Right Hip ABduction  3-/5    Left Hip Flexion  3/5    Left Hip ABduction  3+/5    Right/Left Knee  Right;Left    Right Knee Flexion  3+/5    Right Knee Extension  4/5    Left Knee Flexion  3+/5    Left Knee Extension  4/5    Right/Left Ankle  Right;Left    Right Ankle Dorsiflexion  3/5    Right Ankle Plantar Flexion  3+/5    Right Ankle Inversion  3/5    Right Ankle Eversion  3+/5      Transfers   Transfers  Sit to Stand;Stand to Sit    Sit to Stand  6: Modified independent (Device/Increase time)    Five time sit to stand comments   20.87 secs without UE support     Stand to Sit  6: Modified independent (Device/Increase time)      Ambulation/Gait   Ambulation/Gait  Yes    Ambulation/Gait Assistance  6: Modified independent (Device/Increase time)    Ambulation Distance (Feet)  300 Feet Did DGI without SPC at S level     Assistive device  Straight cane    Gait Pattern  Step-through pattern;Decreased stride length;Antalgic;Trunk flexed    Ambulation Surface  Level;Indoor    Gait velocity  2.00 ft/sec with SPC    Stairs  Yes    Stairs Assistance  5: Supervision    Stairs Assistance Details (indicate cue type and reason)  S for safety     Stair Management Technique  One rail Right;With cane;Step to pattern;Forwards  Number of Stairs  4    Height of Stairs  6    Gait Comments  Had to adjust patients cane as she presented with cane set too high.       Standardized Balance Assessment   Standardized Balance Assessment  Dynamic Gait Index      Dynamic Gait Index   Level Surface  Mild Impairment    Change in Gait Speed  Mild Impairment    Gait with Horizontal Head Turns   Mild Impairment    Gait with Vertical Head Turns  Mild Impairment    Gait and Pivot Turn  Mild Impairment    Step Over Obstacle  Moderate Impairment    Step Around Obstacles  Mild Impairment    Steps  Mild Impairment    Total Score  15    DGI comment:  Scores of 19 or less are predicitve of falls in older community living adults.                Objective measurements completed on examination: See above findings.              PT Education - 08/07/17 1310    Education provided  Yes    Education Details  evaluation findings, POC, goals    Person(s) Educated  Patient    Methods  Explanation    Comprehension  Verbalized understanding       PT Short Term Goals - 08/07/17 1641      PT SHORT TERM GOAL #1   Title  Pt will initiate HEP in order to indicate improved functional mobility.  (Target Date: 09/06/17)    Time  4    Period  Weeks    Status  New    Target Date  09/06/17      PT SHORT TERM GOAL #2   Title  Pt will improve 5TSS to </=17.87 secs without UE support in order to indicate decreased fall risk and improved functional strength.     Time  4    Period  Weeks    Status  New      PT SHORT TERM GOAL #3   Title  Pt will improve DGI to 18/24 in order to indicate decreased fall risk.     Time  4    Period  Weeks    Status  New      PT SHORT TERM GOAL #4   Title  Pt will improve gait speed to >/=2.62 ft/sec at mod I level in order to indicate safe community ambulation.     Time  4    Period  Weeks    Status  New      PT SHORT TERM GOAL #5   Title  Pt will ambulate 500' over unlevel paved outdoor surfaces w/ LRAD in order to indicate safe community negotiation.     Time  4    Period  Weeks    Status  New        PT Long Term Goals - 08/07/17 1644      PT LONG TERM GOAL #1   Title  Pt will be independent with HEP in order to indicate improved functional mobility.  (Target Date: 10/06/17)    Time  8    Period  Weeks    Status  New    Target  Date  10/06/17      PT LONG TERM GOAL #2   Title  Pt will perform 5TSS </=14.87 secs without  UE support in order to indicate improved functional strength and decreased fall risk.      Time  8    Period  Weeks    Status  New      PT LONG TERM GOAL #3   Title  Pt will improve DGI to >/=21/24 in order to indicate decreased fall risk.     Time  8    Status  New      PT LONG TERM GOAL #4   Title  Pt will ambulate x 1000' over varying outdoor surfaces w/ LRAD at mod I level in order to indicate safe community and leisure activity.      Time  8    Period  Weeks    Status  New             Plan - 08/07/17 1312    Clinical Impression Statement  Pt presents with alchol induced polyneuropathy, pain in B feet and low back.  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  She has not bee able to be as active with grandkids and family as she would like and remains at home most of the time due to increased pain and decreased balance.  Upon PT evaluation, note gait speed of 2.00 ft/sec indicative of limited community ambulator, 5TSS time of 20.87 secs without UE support indicative of elevated fall risk and decreased functional strength, and DGI score of 15/24 indicative of high fall risk.  Pt will benefit from skilled OP neuro PT in order to address deficits.      History and Personal Factors relevant to plan of care:  see above    Clinical Presentation  Evolving    Clinical Presentation due to:  see above    Clinical Decision Making  Moderate    Rehab Potential  Good    Clinical Impairments Affecting Rehab Potential  co-morbidities    PT Frequency  2x / week this is recommendation, she may only be able to do 1    PT Duration  8 weeks    PT Treatment/Interventions  ADLs/Self Care Home Management;DME Instruction;Gait training;Stair training;Functional mobility training;Therapeutic activities;Therapeutic exercise;Neuromuscular  re-education;Patient/family education;Orthotic Fit/Training;Passive range of motion;Vestibular    PT Next Visit Plan  initiate HEP for BLE strength, balance, BLE stretching, core exercises to address low back pain    Consulted and Agree with Plan of Care  Patient       Patient will benefit from skilled therapeutic intervention in order to improve the following deficits and impairments:  Decreased activity tolerance, Decreased balance, Decreased endurance, Decreased knowledge of use of DME, Decreased mobility, Decreased strength, Difficulty walking, Impaired flexibility, Postural dysfunction, Pain, Impaired sensation(pain to be addressed with therex)  Visit Diagnosis: Unsteadiness on feet  Muscle weakness (generalized)  Other disturbances of skin sensation  Chronic bilateral low back pain without sciatica  Other abnormalities of gait and mobility     Problem List Patient Active Problem List   Diagnosis Date Noted  . Alcoholic peripheral neuropathy ([place]) 06/09/2017  . Diabetic polyneuropathy associated with diabetes mellitus due to underlying condition ([place]) 06/09/2017  . Tobacco use 06/09/2017  . Foot pain, bilateral 01/18/2017  . Moderate episode of recurrent major depressive disorder ([place]) 05/04/2016  . Breast cancer screening 01/21/2016  . Smoking 09/25/2014  . Chronic pain syndrome 09/25/2014  . Callus of foot 08/19/2014  . Type 2 diabetes mellitus with diabetic polyneuropathy, without long-term  current use of insulin ([place]) 06/30/2014  . Depression 06/30/2014  . Peripheral neuropathy 05/09/2013  . Poor dentition 02/01/2013  . Pain in joint, ankle and foot 02/01/2013  . Essential hypertension, benign 02/01/2013  . FOOT PAIN, BILATERAL 06/16/2010  . RECTAL BLEEDING 12/08/2009  . HEMATURIA UNSPECIFIED 12/08/2009  . DEPRESSION 08/20/2009  . ABSCESS, TOOTH 08/20/2009  . HYPERLIPIDEMIA 07/30/2009  . SICKLE-CELL TRAIT 07/30/2009  . ANEMIA-NOS 07/30/2009  . ALCOHOLISM  07/30/2009  . HYPERTENSION 07/30/2009  . GERD 07/30/2009  . CALLUS, FOOT 07/30/2009   Cameron Sprang, PT, MPT [facility] [address] [place], [place], [address] Phone: [phone]   Fax:  [phone] 08/07/17, 4:48 PM  Name: KIMMARIE PASCALE MRN: [id number] Date of Birth: [birth year]

## 2017-08-15 ENCOUNTER — Encounter: Payer: Self-pay | Admitting: Physical Therapy

## 2017-08-15 ENCOUNTER — Ambulatory Visit: Payer: No Typology Code available for payment source | Attending: Neurology | Admitting: Physical Therapy

## 2017-08-15 DIAGNOSIS — R208 Other disturbances of skin sensation: Secondary | ICD-10-CM | POA: Insufficient documentation

## 2017-08-15 DIAGNOSIS — G8929 Other chronic pain: Secondary | ICD-10-CM | POA: Insufficient documentation

## 2017-08-15 DIAGNOSIS — M545 Low back pain: Secondary | ICD-10-CM | POA: Insufficient documentation

## 2017-08-15 DIAGNOSIS — M6281 Muscle weakness (generalized): Secondary | ICD-10-CM | POA: Insufficient documentation

## 2017-08-15 DIAGNOSIS — R2689 Other abnormalities of gait and mobility: Secondary | ICD-10-CM | POA: Insufficient documentation

## 2017-08-15 DIAGNOSIS — R2681 Unsteadiness on feet: Secondary | ICD-10-CM

## 2017-08-15 NOTE — Patient Instructions (Addendum)
Knee-to-Chest Stretch: Unilateral    With hand behind right knee, pull knee in to chest until a comfortable stretch is felt in lower back and buttocks. Keep back relaxed. Hold _30_ seconds.  Repeat with left leg. Hold for 30 seconds.  Repeat _3_ times on each leg. Do _1-2_ sessions per day.  http://orth.exer.us/127   Copyright  VHI. All rights reserved.   Knee Roll    Lying on back, with knees bent and feet flat on floor, arms outstretched to sides, slowly roll both knees to side, hold 30 seconds.   Then to opposite side, hold 30 seconds. Return to starting position.   Keep shoulders and arms in contact with floor.  Perform 3 reps toward each side, 1-2 times a day. Copyright  VHI. All rights reserved.   Bridge    Lie back, legs bent as shown. Pull belly button in toward spine (tighten or "zip up" stomach). Then lift hips up off bed by pressing down through legs/arms on bed. Slowly lower back down. Repeat _10_ times. Do _1-2_ sessions per day.  http://pm.exer.us/55   Copyright  VHI. All rights reserved.    Strengthening: Hip Abductor - Resisted    LYING FLAT WITH ARMS AT SIDE: With red band looped around both legs above knees, push thighs apart and hold for 5 seconds, then slowly bring legs/knees back together.  Repeat _10_ times. Do _1-2_ sessions per day.  http://orth.exer.us/688     Copyright  VHI. All rights reserved.  Chair Sitting    Sit at edge of seat, spine straight, one leg extended. Put a hand on each thigh and bend forward from the hip, keeping spine straight. Allow hand on extended leg to reach toward toes. Support upper body with other arm. Hold _30_ seconds.  Repeat with other leg.  Repeat __2-3_ times on each leg. Do _1-2_ sessions per day.  Copyright  VHI. All rights reserved.

## 2017-08-17 NOTE — Therapy (Signed)
[facility] [address] [place], [place], [address] Phone: [phone]   Fax:  [phone]  Physical Therapy Treatment  Patient Details  Name: Kendra Griffith MRN: [id number] Date of Birth: 10/24/1965 Referring Provider: Narda Amber, MD   Encounter Date: 08/15/2017     08/15/17 1200  PT Visits / Re-Eval  Visit Number 2  Number of Visits 17  Date for PT Re-Evaluation 10/06/17  Authorization  Authorization Type CAFA approved 100% discount  PT Time Calculation  PT Start Time 1937 (pt late for appt today)  PT Stop Time 1230  PT Time Calculation (min) 32 min  PT - End of Session  Activity Tolerance Patient tolerated treatment well;No increased pain;Patient limited by pain  Behavior During Therapy [facility] for tasks assessed/performed    Past Medical History:  Diagnosis Date  . Anemia   . Anxiety   . Blood transfusion without reported diagnosis    childbirth  . Clotting disorder ([place])    childbirth  . Depression   . Diabetes mellitus without complication (HCC)    prediabetes  . Hypertension   . Injury    right side from jumping off bunkbed  . Pre-diabetes     Past Surgical History:  Procedure Laterality Date  . CESAREAN SECTION     three  . cyst removal 1997    . FACIAL RECONSTRUCTION SURGERY    . FOOT SURGERY Bilateral     There were no vitals filed for this visit.     08/15/17 1158  Symptoms/Limitations  Subjective No new complaints. Late today as her ride was late picking her up. No falls. "Ususal pain" in back and legs.   Limitations House hold activities;Walking;Standing  How long can you stand comfortably? 2 1/2 hours (back and feet pain)  How long can you walk comfortably? 2 hours  Patient Stated Goals "I want to roller skate with my grandkids."   Pain Assessment  Currently in Pain? Yes  Pain Score 8  Pain Location Generalized (back and legs)  Pain Descriptors / Indicators  Radiating;Sharp;Dull  Pain Type Chronic pain  Pain Onset More than a month ago  Pain Frequency Constant  Aggravating Factors  standing, increased walking  Pain Relieving Factors sitting down, massage at times    Treatment: issued the following to pt's HEP today with min guard assist and cues on ex form/technique needed. Knee-to-Chest Stretch: Unilateral    With hand behind right knee, pull knee in to chest until a comfortable stretch is felt in lower back and buttocks. Keep back relaxed. Hold _30_ seconds.  Repeat with left leg. Hold for 30 seconds.  Repeat _3_ times on each leg. Do _1-2_ sessions per day.  http://orth.exer.us/127   Copyright  VHI. All rights reserved.   Knee Roll    Lying on back, with knees bent and feet flat on floor, arms outstretched to sides, slowly roll both knees to side, hold 30 seconds.   Then to opposite side, hold 30 seconds. Return to starting position.   Keep shoulders and arms in contact with floor.  Perform 3 reps toward each side, 1-2 times a day. Copyright  VHI. All rights reserved.   Bridge    Lie back, legs bent as shown. Pull belly button in toward spine (tighten or "zip up" stomach). Then lift hips up off bed by pressing down through legs/arms on bed. Slowly lower back down. Repeat _10_ times. Do _1-2_ sessions per day.  http://pm.exer.us/55   Copyright  VHI. All rights reserved.    Strengthening: Hip Abductor - Resisted    LYING FLAT WITH ARMS AT SIDE: With red band looped around both legs above knees, push thighs apart and hold for 5 seconds, then slowly bring legs/knees back together.  Repeat _10_ times. Do _1-2_ sessions per day.  http://orth.exer.us/688     Copyright  VHI. All rights reserved.  Chair Sitting    Sit at edge of seat, spine straight, one leg extended. Put a hand on each thigh and bend forward from the hip, keeping spine straight. Allow hand on extended leg to reach toward toes. Support upper  body with other arm. Hold _30_ seconds.  Repeat with other leg.  Repeat __2-3_ times on each leg. Do _1-2_ sessions per day.  Copyright  VHI. All rights reserved.         08/15/17 1226  PT Education  Education provided Yes  Education Details HEP for strengthening and stretching.  Person(s) Educated Patient  Methods Explanation;Demonstration;Verbal cues;Handout;Tactile cues  Comprehension Verbalized understanding;Verbal cues required;Tactile cues required;Need further instruction       PT Short Term Goals - 08/07/17 1641      PT SHORT TERM GOAL #1   Title  Pt will initiate HEP in order to indicate improved functional mobility.  (Target Date: 09/06/17)    Time  4    Period  Weeks    Status  New    Target Date  09/06/17      PT SHORT TERM GOAL #2   Title  Pt will improve 5TSS to </=17.87 secs without UE support in order to indicate decreased fall risk and improved functional strength.     Time  4    Period  Weeks    Status  New      PT SHORT TERM GOAL #3   Title  Pt will improve DGI to 18/24 in order to indicate decreased fall risk.     Time  4    Period  Weeks    Status  New      PT SHORT TERM GOAL #4   Title  Pt will improve gait speed to >/=2.62 ft/sec at mod I level in order to indicate safe community ambulation.     Time  4    Period  Weeks    Status  New      PT SHORT TERM GOAL #5   Title  Pt will ambulate 500' over unlevel paved outdoor surfaces w/ LRAD in order to indicate safe community negotiation.     Time  4    Period  Weeks    Status  New        PT Long Term Goals - 08/07/17 1644      PT LONG TERM GOAL #1   Title  Pt will be independent with HEP in order to indicate improved functional mobility.  (Target Date: 10/06/17)    Time  8    Period  Weeks    Status  New    Target Date  10/06/17      PT LONG TERM GOAL #2   Title  Pt will perform 5TSS </=14.87 secs without UE support in order to indicate improved functional strength and decreased  fall risk.      Time  8    Period  Weeks    Status  New      PT LONG TERM GOAL #3   Title  Pt will improve DGI to >/=21/24 in order  to indicate decreased fall risk.     Time  8    Status  New      PT LONG TERM GOAL #4   Title  Pt will ambulate x 1000' over varying outdoor surfaces w/ LRAD at mod I level in order to indicate safe community and leisure activity.      Time  8    Period  Weeks    Status  New         08/15/17 1200  Plan  Clinical Impression Statement Today's skilled session focused on an HEP to address LE/core strengthening and flexibility with out any increased pain reported. Will plan to add balance components next session. Pt is progressing toward goals and should benefit from continued PT to progress toward unmet goals.   Pt will benefit from skilled therapeutic intervention in order to improve on the following deficits Decreased activity tolerance;Decreased balance;Decreased endurance;Decreased knowledge of use of DME;Decreased mobility;Decreased strength;Difficulty walking;Impaired flexibility;Postural dysfunction;Pain;Impaired sensation (pain to be addressed with therex)  Rehab Potential Good  Clinical Impairments Affecting Rehab Potential co-morbidities  PT Frequency 2x / week (this is recommendation, she may only be able to do 1)  PT Duration 8 weeks  PT Treatment/Interventions ADLs/Self Care Home Management;DME Instruction;Gait training;Stair training;Functional mobility training;Therapeutic activities;Therapeutic exercise;Neuromuscular re-education;Patient/family education;Orthotic Fit/Training;Passive range of motion;Vestibular  PT Next Visit Plan add to HEP for balance; continue to work on strengthening and stretching  Consulted and Agree with Plan of Care Patient          Patient will benefit from skilled therapeutic intervention in order to improve the following deficits and impairments:  Decreased activity tolerance, Decreased balance, Decreased  endurance, Decreased knowledge of use of DME, Decreased mobility, Decreased strength, Difficulty walking, Impaired flexibility, Postural dysfunction, Pain, Impaired sensation(pain to be addressed with therex)  Visit Diagnosis: Unsteadiness on feet  Muscle weakness (generalized)  Other abnormalities of gait and mobility  Chronic bilateral low back pain without sciatica     Problem List Patient Active Problem List   Diagnosis Date Noted  . Alcoholic peripheral neuropathy ([place]) 06/09/2017  . Diabetic polyneuropathy associated with diabetes mellitus due to underlying condition ([place]) 06/09/2017  . Tobacco use 06/09/2017  . Foot pain, bilateral 01/18/2017  . Moderate episode of recurrent major depressive disorder ([place]) 05/04/2016  . Breast cancer screening 01/21/2016  . Smoking 09/25/2014  . Chronic pain syndrome 09/25/2014  . Callus of foot 08/19/2014  . Type 2 diabetes mellitus with diabetic polyneuropathy, without long-term current use of insulin ([place]) 06/30/2014  . Depression 06/30/2014  . Peripheral neuropathy 05/09/2013  . Poor dentition 02/01/2013  . Pain in joint, ankle and foot 02/01/2013  . Essential hypertension, benign 02/01/2013  . FOOT PAIN, BILATERAL 06/16/2010  . RECTAL BLEEDING 12/08/2009  . HEMATURIA UNSPECIFIED 12/08/2009  . DEPRESSION 08/20/2009  . ABSCESS, TOOTH 08/20/2009  . HYPERLIPIDEMIA 07/30/2009  . SICKLE-CELL TRAIT 07/30/2009  . ANEMIA-NOS 07/30/2009  . ALCOHOLISM 07/30/2009  . HYPERTENSION 07/30/2009  . GERD 07/30/2009  . CALLUS, FOOT 07/30/2009    Willow Ora, PTA, [facility] [address], [place], [place] [address] [phone] 08/17/17, 12:12 PM   Name: Kendra Griffith MRN: [id number] Date of Birth: 03/29/1966

## 2017-08-18 ENCOUNTER — Ambulatory Visit: Payer: No Typology Code available for payment source | Admitting: Physical Therapy

## 2017-08-18 ENCOUNTER — Encounter: Payer: Self-pay | Admitting: Physical Therapy

## 2017-08-18 DIAGNOSIS — R208 Other disturbances of skin sensation: Secondary | ICD-10-CM

## 2017-08-18 DIAGNOSIS — R2681 Unsteadiness on feet: Secondary | ICD-10-CM

## 2017-08-18 DIAGNOSIS — G8929 Other chronic pain: Secondary | ICD-10-CM

## 2017-08-18 DIAGNOSIS — R2689 Other abnormalities of gait and mobility: Secondary | ICD-10-CM

## 2017-08-18 DIAGNOSIS — M6281 Muscle weakness (generalized): Secondary | ICD-10-CM

## 2017-08-18 DIAGNOSIS — M545 Low back pain: Secondary | ICD-10-CM

## 2017-08-18 MED FILL — IBUPROFEN 800 MG TABLET: 800 | 20 days supply | Qty: 60 | Fill #0

## 2017-08-18 MED FILL — METFORMIN HCL ER 500 MG TAB: 500 | 30 days supply | Qty: 30 | Fill #1

## 2017-08-18 NOTE — Patient Instructions (Addendum)
Perform these in the corner with a chair in front for safety: Feet Together (Compliant Surface) Varied Arm Positions - Eyes Closed    Stand on compliant surface: _pillow/s__ with feet together and arms out. Close eyes and visualize upright position. Hold_30_ seconds. Repeat _3_ times per session. Do _1-2_ sessions per day.  Copyright  VHI. All rights reserved.    Feet Apart (Compliant Surface) Head Motion - Eyes Closed    Stand on compliant surface: _pillow/s_ with feet shoulder width apart. Close eyes and move head slowly: 1. Up and down  X 10 reps 2. Left and right x 10 reps 3. Up to the right (ceiling) then down to the left (floor) x 10 reps 4. Up to the left (ceiling) then down to the right (floor) x 10 reps  Do _1-2_ sessions per day.  Copyright  VHI. All rights reserved.     TANDEM STANCE WITH SUPPORT  With hand support:  place the heel of one foot so that it is touching the toes of the other foot. Maintain your balance in this position for 20 seconds while trying to lift hands off chair as able.  Switch feet so that the other foot is in front and repeat. Holding balance while trying to lift hands of chair.  Perform 2 reps with each foot forward. 1-2 times a day.           Single Leg - Eyes Open    Holding support, lift right leg while maintaining balance over left leg. Progress to removing hands from support surface for longer periods of time. Hold_20_ seconds.  Then switch: standing on right leg, lift the left leg up. Progress to removing hands from support surface as able. Hold for 20 seconds.  Repeat __3__ times standing on each leg. Do _1-2_ sessions per day.  Copyright  VHI. All rights reserved.   Perform these at a counter or table for support/balance assistance as needed:   "I love a Parade" Lift   At counter for balance as needed: high knee marching forward and then backward. 3 second pauses with each knee lift.  Repeat 3 laps each way.  Do _1-2_ sessions per day. http://gt2.exer.us/345   Copyright  VHI. All rights reserved.      Crossovers    Move to side: 1) cross right leg in front, then 2) bring back leg out to side. Repeat the front cross over stepping along the counter/table to the end.   Then repeat the process of crossing the outside leg in front of the other along counter/table back to starting position.  Perform 2-3 laps along counter in each direction. 1-2 time a day.  Copyright  VHI. All rights reserved.

## 2017-08-19 NOTE — Therapy (Signed)
[facility] [address] [place], [place], [address] Phone: [phone]   Fax:  [phone]  Physical Therapy Treatment  Patient Details  Name: Kendra Griffith MRN: [id number] Date of Birth: 06/17/1965 Referring Provider: Narda Amber, MD   Encounter Date: 08/18/2017   08/18/17 1108  PT Visits / Re-Eval  Visit Number 3  Number of Visits 17  Date for PT Re-Evaluation 10/06/17  Authorization  Authorization Type CAFA approved 100% discount  PT Time Calculation  PT Start Time 1105  PT Stop Time 1146  PT Time Calculation (min) 41 min  PT - End of Session  Activity Tolerance Patient tolerated treatment well;No increased pain;Patient limited by pain  Behavior During Therapy [facility] for tasks assessed/performed     Past Medical History:  Diagnosis Date  . Anemia   . Anxiety   . Blood transfusion without reported diagnosis    childbirth  . Clotting disorder ([place])    childbirth  . Depression   . Diabetes mellitus without complication (HCC)    prediabetes  . Hypertension   . Injury    right side from jumping off bunkbed  . Pre-diabetes     Past Surgical History:  Procedure Laterality Date  . CESAREAN SECTION     three  . cyst removal 1997    . FACIAL RECONSTRUCTION SURGERY    . FOOT SURGERY Bilateral     There were no vitals filed for this visit.     08/18/17 1106  Symptoms/Limitations  Subjective No new complaints. Did the ex's this morning with no issues. Has a catch in her back today so using the cane for safety.  Limitations House hold activities;Walking;Standing  How long can you stand comfortably? 2 1/2 hours (back and feet pain)  Patient Stated Goals "I want to roller skate with my grandkids."   Pain Assessment  Currently in Pain? Yes  Pain Score 5  Pain Location Generalized (back and legs)  Pain Orientation Lower;Right  Pain Descriptors / Indicators Aching;Sore;Dull  Pain Type Chronic pain    Pain Onset More than a month ago  Pain Frequency Constant  Aggravating Factors  prolonged, standing and walking  Pain Relieving Factors sitting down, massage at times   treatment:  Issued the following to pt's HEP today. Min guard assist for balance with cues on ex form/technique.  Perform these in the corner with a chair in front for safety: Feet Together (Compliant Surface) Varied Arm Positions - Eyes Closed    Stand on compliant surface: _pillow/s__ with feet together and arms out. Close eyes and visualize upright position. Hold_30_ seconds. Repeat _3_ times per session. Do _1-2_ sessions per day.  Copyright  VHI. All rights reserved.    Feet Apart (Compliant Surface) Head Motion - Eyes Closed    Stand on compliant surface: _pillow/s_ with feet shoulder width apart. Close eyes and move head slowly: 1. Up and down  X 10 reps 2. Left and right x 10 reps 3. Up to the right (ceiling) then down to the left (floor) x 10 reps 4. Up to the left (ceiling) then down to the right (floor) x 10 reps  Do _1-2_ sessions per day.  Copyright  VHI. All rights reserved.     TANDEM STANCE WITH SUPPORT  With hand support:  place the heel of one foot so that it is touching the toes of the other foot. Maintain your balance in this position for 20 seconds while trying to lift hands off  chair as able.  Switch feet so that the other foot is in front and repeat. Holding balance while trying to lift hands of chair.  Perform 2 reps with each foot forward. 1-2 times a day.   Single Leg - Eyes Open    Holding support, lift right leg while maintaining balance over left leg. Progress to removing hands from support surface for longer periods of time. Hold_20_ seconds.  Then switch: standing on right leg, lift the left leg up. Progress to removing hands from support surface as able. Hold for 20 seconds.  Repeat __3__ times standing on each leg. Do _1-2_ sessions per day.  Copyright  VHI.  All rights reserved.   Perform these at a counter or table for support/balance assistance as needed:   "I love a Parade" Lift   At counter for balance as needed: high knee marching forward and then backward. 3 second pauses with each knee lift.  Repeat 3 laps each way. Do _1-2_ sessions per day. http://gt2.exer.us/345   Copyright  VHI. All rights reserved.      Crossovers    Move to side: 1) cross right leg in front, then 2) bring back leg out to side. Repeat the front cross over stepping along the counter/table to the end.   Then repeat the process of crossing the outside leg in front of the other along counter/table back to starting position.  Perform 2-3 laps along counter in each direction. 1-2 time a day.  Copyright  VHI. All rights reserved.      08/18/17 1918  PT Education  Education provided Yes  Education Details added balance components  Person(s) Educated Patient  Methods Explanation;Demonstration;Verbal cues;Handout  Comprehension Verbalized understanding;Returned demonstration;Verbal cues required;Need further instruction       PT Short Term Goals - 08/07/17 1641      PT SHORT TERM GOAL #1   Title  Pt will initiate HEP in order to indicate improved functional mobility.  (Target Date: 09/06/17)    Time  4    Period  Weeks    Status  New    Target Date  09/06/17      PT SHORT TERM GOAL #2   Title  Pt will improve 5TSS to </=17.87 secs without UE support in order to indicate decreased fall risk and improved functional strength.     Time  4    Period  Weeks    Status  New      PT SHORT TERM GOAL #3   Title  Pt will improve DGI to 18/24 in order to indicate decreased fall risk.     Time  4    Period  Weeks    Status  New      PT SHORT TERM GOAL #4   Title  Pt will improve gait speed to >/=2.62 ft/sec at mod I level in order to indicate safe community ambulation.     Time  4    Period  Weeks    Status  New      PT SHORT TERM GOAL #5   Title   Pt will ambulate 500' over unlevel paved outdoor surfaces w/ LRAD in order to indicate safe community negotiation.     Time  4    Period  Weeks    Status  New        PT Long Term Goals - 08/07/17 1644      PT LONG TERM GOAL #1   Title  Pt will be  independent with HEP in order to indicate improved functional mobility.  (Target Date: 10/06/17)    Time  8    Period  Weeks    Status  New    Target Date  10/06/17      PT LONG TERM GOAL #2   Title  Pt will perform 5TSS </=14.87 secs without UE support in order to indicate improved functional strength and decreased fall risk.      Time  8    Period  Weeks    Status  New      PT LONG TERM GOAL #3   Title  Pt will improve DGI to >/=21/24 in order to indicate decreased fall risk.     Time  8    Status  New      PT LONG TERM GOAL #4   Title  Pt will ambulate x 1000' over varying outdoor surfaces w/ LRAD at mod I level in order to indicate safe community and leisure activity.      Time  8    Period  Weeks    Status  New         08/18/17 1108  Plan  Clinical Impression Statement Today's skilled session focused on addtion of balance components to pt's HEP with no issues reported with performance in session. Discussed only doing a few a day, alternating them, so not to be overwhelmed by all of them at one time. Pt agreed and verbalized understanding. Pt is progressing toward goals and should benefit from continued PT to progress toward unmet goals.      Pt will benefit from skilled therapeutic intervention in order to improve on the following deficits Decreased activity tolerance;Decreased balance;Decreased endurance;Decreased knowledge of use of DME;Decreased mobility;Decreased strength;Difficulty walking;Impaired flexibility;Postural dysfunction;Pain;Impaired sensation (pain to be addressed with therex)  Rehab Potential Good  Clinical Impairments Affecting Rehab Potential co-morbidities  PT Frequency 2x / week (this is recommendation,  she may only be able to do 1)  PT Duration 8 weeks  PT Treatment/Interventions ADLs/Self Care Home Management;DME Instruction;Gait training;Stair training;Functional mobility training;Therapeutic activities;Therapeutic exercise;Neuromuscular re-education;Patient/family education;Orthotic Fit/Training;Passive range of motion;Vestibular  PT Next Visit Plan continue to work on strengthening and stretching  Consulted and Agree with Plan of Care Patient      Patient will benefit from skilled therapeutic intervention in order to improve the following deficits and impairments:  Decreased activity tolerance, Decreased balance, Decreased endurance, Decreased knowledge of use of DME, Decreased mobility, Decreased strength, Difficulty walking, Impaired flexibility, Postural dysfunction, Pain, Impaired sensation(pain to be addressed with therex)  Visit Diagnosis: Unsteadiness on feet  Muscle weakness (generalized)  Other abnormalities of gait and mobility  Chronic bilateral low back pain without sciatica  Other disturbances of skin sensation     Problem List Patient Active Problem List   Diagnosis Date Noted  . Alcoholic peripheral neuropathy ([place]) 06/09/2017  . Diabetic polyneuropathy associated with diabetes mellitus due to underlying condition ([place]) 06/09/2017  . Tobacco use 06/09/2017  . Foot pain, bilateral 01/18/2017  . Moderate episode of recurrent major depressive disorder ([place]) 05/04/2016  . Breast cancer screening 01/21/2016  . Smoking 09/25/2014  . Chronic pain syndrome 09/25/2014  . Callus of foot 08/19/2014  . Type 2 diabetes mellitus with diabetic polyneuropathy, without long-term current use of insulin ([place]) 06/30/2014  . Depression 06/30/2014  . Peripheral neuropathy 05/09/2013  . Poor dentition 02/01/2013  . Pain in joint, ankle and foot 02/01/2013  . Essential hypertension, benign 02/01/2013  . FOOT PAIN,  BILATERAL 06/16/2010  . RECTAL BLEEDING 12/08/2009  .  HEMATURIA UNSPECIFIED 12/08/2009  . DEPRESSION 08/20/2009  . ABSCESS, TOOTH 08/20/2009  . HYPERLIPIDEMIA 07/30/2009  . SICKLE-CELL TRAIT 07/30/2009  . ANEMIA-NOS 07/30/2009  . ALCOHOLISM 07/30/2009  . HYPERTENSION 07/30/2009  . GERD 07/30/2009  . CALLUS, FOOT 07/30/2009    Willow Ora, PTA, [facility] [address], [place], [place] [address] [phone] 08/19/17, 7:16 PM   Name: Kendra Griffith MRN: [id number] Date of Birth: [birth year]

## 2017-08-21 ENCOUNTER — Ambulatory Visit: Payer: No Typology Code available for payment source | Admitting: Podiatry

## 2017-08-21 DIAGNOSIS — G621 Alcoholic polyneuropathy: Secondary | ICD-10-CM

## 2017-08-21 DIAGNOSIS — M5416 Radiculopathy, lumbar region: Secondary | ICD-10-CM

## 2017-08-22 ENCOUNTER — Ambulatory Visit: Payer: Self-pay | Admitting: Physical Therapy

## 2017-08-24 ENCOUNTER — Ambulatory Visit (HOSPITAL_COMMUNITY)
Admission: RE | Admit: 2017-08-24 | Discharge: 2017-08-24 | Disposition: A | Discharge status: Home / Self Care | Payer: No Typology Code available for payment source | Source: Ambulatory Visit | Attending: Internal Medicine | Admitting: Internal Medicine

## 2017-08-24 DIAGNOSIS — M5137 Other intervertebral disc degeneration, lumbosacral region: Secondary | ICD-10-CM | POA: Insufficient documentation

## 2017-08-24 DIAGNOSIS — I10 Essential (primary) hypertension: Secondary | ICD-10-CM | POA: Insufficient documentation

## 2017-08-24 DIAGNOSIS — M5136 Other intervertebral disc degeneration, lumbar region: Secondary | ICD-10-CM | POA: Insufficient documentation

## 2017-08-25 ENCOUNTER — Ambulatory Visit: Payer: Self-pay | Admitting: Rehabilitation

## 2017-08-28 ENCOUNTER — Telehealth: Payer: Self-pay | Admitting: *Deleted

## 2017-08-28 DIAGNOSIS — M5416 Radiculopathy, lumbar region: Secondary | ICD-10-CM

## 2017-08-28 NOTE — Telephone Encounter (Signed)
Faxed referral to [facility]. 

## 2017-08-28 NOTE — Progress Notes (Signed)
   HPI: 52 year old female presents the office today for evaluation of burning and shooting sensations in the feet bilateral.  She also experiences some numbness and coldness.  She has also has history of lumbar back pain.  She states that the pain extends down her buttocks and legs down to her feet.  She does not recall a significant history for acute injury or trauma.  Patient does have a history of alcohol abuse at which time the neuropathy developed. She also has painful hyperkeratotic callus lesions noted to the bilateral feet which are painful with shoe gear and during ambulation.  She presents today for further treatment evaluation  Past Medical History:  Diagnosis Date  . Anemia   . Anxiety   . Blood transfusion without reported diagnosis    childbirth  . Clotting disorder ([place])    childbirth  . Depression   . Diabetes mellitus without complication (HCC)    prediabetes  . Hypertension   . Injury    right side from jumping off bunkbed  . Pre-diabetes      Physical Exam: General: The patient is alert and oriented x3 in no acute distress.  Dermatology: Skin is warm, dry and supple bilateral lower extremities. Negative for open lesions or macerations.  Hyperkeratotic callus lesions noted to bilateral feet x4  Vascular: Palpable pedal pulses bilaterally. No edema or erythema noted. Capillary refill within normal limits.  Neurological: Epicritic and protective threshold grossly intact bilaterally.   Musculoskeletal Exam: Range of motion within normal limits to all pedal and ankle joints bilateral. Muscle strength 5/5 in all groups bilateral.   Radiographic Exam:  Normal osseous mineralization. Joint spaces preserved. No fracture/dislocation/boney destruction.    Assessment: 1.  Lumbar radiculopathy 2.  Alcohol induced peripheral neuropathy 3.  Pre-ulcerative callus lesion bilateral x4   Plan of Care:  1. Patient evaluated.  2.  Excisional debridement of the callus lesions  was performed using a chisel blade without incident or bleeding. 3.  Today I explained that the patient's symptoms regarding her lower extremities are likely due to either her history of alcohol which can cause alcohol induced peripheral neuropathy or coming from her lumbar spine which she does states she has a history of problems. 4.  Recommend referral to a spine specialist to further evaluate her lumbar radiculopathy to see if it is contributory to her lower extremity symptoms 5.  Return to clinic as needed      Edrick Kins, DPM [facility]  Dr. Edrick Kins, DPM    [address], [place] [address]                Office [phone]  Fax [phone]

## 2017-08-28 NOTE — Telephone Encounter (Signed)
-----   Message from Edrick Kins, DPM sent at 08/28/2017  8:22 AM EDT ----- Regarding: Referral to neuro spine, or ortho spine Please refer patient to either neuro spine specialist or orthopedic spine specialist, whatever is easier.  Patient is a [place] discount  Diagnosis: Lumbar radiculopathy  Thanks, Dr. Amalia Hailey

## 2017-08-29 ENCOUNTER — Ambulatory Visit: Payer: No Typology Code available for payment source | Admitting: Physical Therapy

## 2017-08-29 ENCOUNTER — Encounter: Payer: Self-pay | Admitting: Physical Therapy

## 2017-08-29 DIAGNOSIS — M6281 Muscle weakness (generalized): Secondary | ICD-10-CM

## 2017-08-29 DIAGNOSIS — M545 Low back pain: Secondary | ICD-10-CM

## 2017-08-29 DIAGNOSIS — R2681 Unsteadiness on feet: Secondary | ICD-10-CM

## 2017-08-29 DIAGNOSIS — R208 Other disturbances of skin sensation: Secondary | ICD-10-CM

## 2017-08-29 DIAGNOSIS — R2689 Other abnormalities of gait and mobility: Secondary | ICD-10-CM

## 2017-08-29 DIAGNOSIS — G8929 Other chronic pain: Secondary | ICD-10-CM

## 2017-08-30 NOTE — Therapy (Signed)
[facility] [address] [place], [place], [address] Phone: [phone]   Fax:  [phone]  Physical Therapy Treatment  Patient Details  Name: Kendra Griffith MRN: [id number] Date of Birth: 09/09/1965 Referring Provider: Narda Amber, MD   Encounter Date: 08/29/2017  PT End of Session - 08/29/17 1200    Visit Number  4    Number of Visits  17    Date for PT Re-Evaluation  10/06/17    Authorization Type  CAFA approved 100% discount    PT Start Time  1148    PT Stop Time  1227    PT Time Calculation (min)  39 min    Equipment Utilized During Treatment  Gait belt    Activity Tolerance  Patient tolerated treatment well;No increased pain;Patient limited by pain    Behavior During Therapy  [facility] for tasks assessed/performed       Past Medical History:  Diagnosis Date  . Anemia   . Anxiety   . Blood transfusion without reported diagnosis    childbirth  . Clotting disorder ([place])    childbirth  . Depression   . Diabetes mellitus without complication (HCC)    prediabetes  . Hypertension   . Injury    right side from jumping off bunkbed  . Pre-diabetes     Past Surgical History:  Procedure Laterality Date  . CESAREAN SECTION     three  . cyst removal 1997    . FACIAL RECONSTRUCTION SURGERY    . FOOT SURGERY Bilateral     There were no vitals filed for this visit.  Subjective Assessment - 08/29/17 1153    Subjective  Had a fall a few days ago in her closet, not to ground due to caught herself on her left elbow. Was sore for days. Seen the doctor the same day and informed him, no issues noted with his exam. Has been referred to [facility] to have her spine evaluated due to LE weakness/numbers (R/O spine issues vs alcohol related neuropathy).                               Limitations  House hold activities;Walking;Standing    How long can you stand comfortably?  2 1/2 hours (back and feet pain)    How  long can you walk comfortably?  2 hours    Patient Stated Goals  "I want to roller skate with my grandkids."     Currently in Pain?  Yes    Pain Score  6     Pain Location  Generalized back and LEs    Pain Descriptors / Indicators  Squeezing;Numbness "catching", "like it gets stuck"    Pain Type  Chronic pain    Pain Onset  More than a month ago    Pain Frequency  Constant    Aggravating Factors   prolonged standing and walking, immobility as it wakes her at night    Pain Relieving Factors  sitting down to rest, massage at times           [facility] Adult PT Treatment/Exercise - 08/29/17 1201      Self-Care   Self-Care  Other Self-Care Comments    Other Self-Care Comments   discussed fall prevention after recent fall in closet. have light on, use support when getting items off floor (or ask for assistance if item is heavy), kneeling down vs bending at waist  to decrease strain on back.       Neuro Re-ed    Neuro Re-ed Details   next to mat with chair in front for safety: on airex with feet hip width apart- EC no head movments, progressing to [facility] with head movements left<>right and up<>down. no UE support with min guard to min assist for balance. cues on posture and weight shifting to assist with balance correction; with red theraband: 3 way pulls (alternating horizontal abduction<>diagonals both ways) x 6-8 reps with       Exercises   Exercises  Other Exercises    Other Exercises   seated hamstring stretch for 30 seconds x 2 reps each leg; in hooklying- double knee to chest stretch 30 sec's x 2 reps, lower trunk rotation with bil LE's over red pball for 5 sec holds x 10 reps each way, bridge with LE's over red pball x 10 reps with 5 sec holds each rep.                                      PT Short Term Goals - 08/07/17 1641      PT SHORT TERM GOAL #1   Title  Pt will initiate HEP in order to indicate improved functional mobility.  (Target Date: 09/06/17)    Time  4    Period  Weeks     Status  New    Target Date  09/06/17      PT SHORT TERM GOAL #2   Title  Pt will improve 5TSS to </=17.87 secs without UE support in order to indicate decreased fall risk and improved functional strength.     Time  4    Period  Weeks    Status  New      PT SHORT TERM GOAL #3   Title  Pt will improve DGI to 18/24 in order to indicate decreased fall risk.     Time  4    Period  Weeks    Status  New      PT SHORT TERM GOAL #4   Title  Pt will improve gait speed to >/=2.62 ft/sec at mod I level in order to indicate safe community ambulation.     Time  4    Period  Weeks    Status  New      PT SHORT TERM GOAL #5   Title  Pt will ambulate 500' over unlevel paved outdoor surfaces w/ LRAD in order to indicate safe community negotiation.     Time  4    Period  Weeks    Status  New        PT Long Term Goals - 08/07/17 1644      PT LONG TERM GOAL #1   Title  Pt will be independent with HEP in order to indicate improved functional mobility.  (Target Date: 10/06/17)    Time  8    Period  Weeks    Status  New    Target Date  10/06/17      PT LONG TERM GOAL #2   Title  Pt will perform 5TSS </=14.87 secs without UE support in order to indicate improved functional strength and decreased fall risk.      Time  8    Period  Weeks    Status  New      PT LONG TERM GOAL #3  Title  Pt will improve DGI to >/=21/24 in order to indicate decreased fall risk.     Time  8    Status  New      PT LONG TERM GOAL #4   Title  Pt will ambulate x 1000' over varying outdoor surfaces w/ LRAD at mod I level in order to indicate safe community and leisure activity.      Time  8    Period  Weeks    Status  New            Plan - 08/29/17 1201    Clinical Impression Statement  Today's session focused on safety/fall prevention, stretching to decrease pain, postural strengthening and balance with no increase in pain reported. Pt continues to be challenged on compliant surfaces. Pt is progressing  toward goals and should benefit from continued PT to progress toward unmet goals.     Rehab Potential  Good    Clinical Impairments Affecting Rehab Potential  co-morbidities    PT Frequency  2x / week this is recommendation, she may only be able to do 1    PT Duration  8 weeks    PT Treatment/Interventions  ADLs/Self Care Home Management;DME Instruction;Gait training;Stair training;Functional mobility training;Therapeutic activities;Therapeutic exercise;Neuromuscular re-education;Patient/family education;Orthotic Fit/Training;Passive range of motion;Vestibular    PT Next Visit Plan  continue to work on strengthening and stretching    Consulted and Agree with Plan of Care  Patient       Patient will benefit from skilled therapeutic intervention in order to improve the following deficits and impairments:  Decreased activity tolerance, Decreased balance, Decreased endurance, Decreased knowledge of use of DME, Decreased mobility, Decreased strength, Difficulty walking, Impaired flexibility, Postural dysfunction, Pain, Impaired sensation(pain to be addressed with therex)  Visit Diagnosis: Unsteadiness on feet  Muscle weakness (generalized)  Other abnormalities of gait and mobility  Chronic bilateral low back pain without sciatica  Other disturbances of skin sensation     Problem List Patient Active Problem List   Diagnosis Date Noted  . Alcoholic peripheral neuropathy ([name]) 06/09/2017  . Diabetic polyneuropathy associated with diabetes mellitus due to underlying condition ([place]) 06/09/2017  . Tobacco use 06/09/2017  . Foot pain, bilateral 01/18/2017  . Moderate episode of recurrent major depressive disorder ([place]) 05/04/2016  . Breast cancer screening 01/21/2016  . Smoking 09/25/2014  . Chronic pain syndrome 09/25/2014  . Callus of foot 08/19/2014  . Type 2 diabetes mellitus with diabetic polyneuropathy, without long-term current use of insulin ([place]) 06/30/2014  . Depression  06/30/2014  . Peripheral neuropathy 05/09/2013  . Poor dentition 02/01/2013  . Pain in joint, ankle and foot 02/01/2013  . Essential hypertension, benign 02/01/2013  . FOOT PAIN, BILATERAL 06/16/2010  . RECTAL BLEEDING 12/08/2009  . HEMATURIA UNSPECIFIED 12/08/2009  . DEPRESSION 08/20/2009  . ABSCESS, TOOTH 08/20/2009  . HYPERLIPIDEMIA 07/30/2009  . SICKLE-CELL TRAIT 07/30/2009  . ANEMIA-NOS 07/30/2009  . ALCOHOLISM 07/30/2009  . HYPERTENSION 07/30/2009  . GERD 07/30/2009  . CALLUS, FOOT 07/30/2009    Willow Ora, PTA, [facility] [address] [place], [place] [address] [phone] 08/30/17, 2:24 PM   Name: Kendra Griffith MRN: [id number] Date of Birth: September 16, 1965

## 2017-09-01 ENCOUNTER — Encounter: Payer: Self-pay | Admitting: Rehabilitation

## 2017-09-01 ENCOUNTER — Ambulatory Visit: Payer: No Typology Code available for payment source | Admitting: Rehabilitation

## 2017-09-01 DIAGNOSIS — R2689 Other abnormalities of gait and mobility: Secondary | ICD-10-CM

## 2017-09-01 DIAGNOSIS — M6281 Muscle weakness (generalized): Secondary | ICD-10-CM

## 2017-09-01 DIAGNOSIS — R2681 Unsteadiness on feet: Secondary | ICD-10-CM

## 2017-09-01 DIAGNOSIS — M545 Low back pain: Secondary | ICD-10-CM

## 2017-09-01 DIAGNOSIS — R208 Other disturbances of skin sensation: Secondary | ICD-10-CM

## 2017-09-01 DIAGNOSIS — G8929 Other chronic pain: Secondary | ICD-10-CM

## 2017-09-01 NOTE — Therapy (Signed)
[facility] [address] [place], [place], [address] Phone: [phone]   Fax:  [phone]  Physical Therapy Treatment  Patient Details  Name: Kendra Griffith MRN: [id number] Date of Birth: 04-Sep-1965 Referring Provider: Narda Amber, MD   Encounter Date: 09/01/2017  PT End of Session - 09/01/17 1252    Visit Number  5    Number of Visits  17    Date for PT Re-Evaluation  10/06/17    Authorization Type  CAFA approved 100% discount    PT Start Time  1017    PT Stop Time  1100    PT Time Calculation (min)  43 min    Equipment Utilized During Treatment  Gait belt    Activity Tolerance  Patient tolerated treatment well;No increased pain;Patient limited by pain    Behavior During Therapy  [facility] for tasks assessed/performed       Past Medical History:  Diagnosis Date  . Anemia   . Anxiety   . Blood transfusion without reported diagnosis    childbirth  . Clotting disorder ([place])    childbirth  . Depression   . Diabetes mellitus without complication (HCC)    prediabetes  . Hypertension   . Injury    right side from jumping off bunkbed  . Pre-diabetes     Past Surgical History:  Procedure Laterality Date  . CESAREAN SECTION     three  . cyst removal 1997    . FACIAL RECONSTRUCTION SURGERY    . FOOT SURGERY Bilateral     There were no vitals filed for this visit.  Subjective Assessment - 09/01/17 1018    Subjective  Reports she sprained R wrist, unsure of how this happened, maybe she stumbled?    Limitations  House hold activities;Walking;Standing    How long can you stand comfortably?  2 1/2 hours (back and feet pain)    How long can you walk comfortably?  2 hours    Patient Stated Goals  "I want to roller skate with my grandkids."     Currently in Pain?  Yes    Pain Score  8     Pain Location  Wrist    Pain Orientation  Right    Pain Descriptors / Indicators  Aching    Pain Type  Acute pain    Pain  Onset  More than a month ago    Pain Frequency  Constant    Aggravating Factors   unsure    Pain Relieving Factors  nothing (just happened last night)    Multiple Pain Sites  Yes    Pain Score  6    Pain Location  Back    Pain Orientation  Lower    Pain Descriptors / Indicators  Aching    Pain Type  Chronic pain    Pain Onset  More than a month ago    Pain Frequency  Constant    Aggravating Factors   walking    Pain Relieving Factors  rest                       [facility] Adult PT Treatment/Exercise - 09/01/17 1040      Self-Care   Self-Care  Other Self-Care Comments    Other Self-Care Comments   Pt reports that she sprained her R wrist but is unsure of how this happened.  Provided light ROM and gentle mobilization between carpal bones and radius.  Pt  able to tolerate but demos limited wrist flex with increased pain.  Provided education on gentle ROM and use of ice for pain. If pain does not improve, seek medical attention.  Pt verbalized understanding.       Neuro Re-ed    Neuro Re-ed Details   In corner on small rocker board biased vertically maintaining balance with intermittent single UE support with EO x 2 sets of 20 secs>EO with head turns side/side x 10 reps and up/down x 10 reps (again with intermittent support).  While standing on compliant therapy mat marching x 10 reps on each side without support (min/guard A) progressing to alternating cone taps x 10 reps to tipping cone over and back upright (with foot still off ground) to increase time spent in SLS.  Pt did very well with light tactile cues for improved weight shift.        Exercises   Exercises  Other Exercises    Other Exercises   Reviewed some exercises from previous session, see pt instruction, supine posterior pelvic tilt x 10 reps with max verbal and demo cues for correct technique.  hooklying marching x 10 reps progressing to very light taps when marching x 10 reps, to no touches during marching x 10 reps.                 PT Education - 09/01/17 1252    Education provided  Yes    Education Details  see self care     Person(s) Educated  Patient    Methods  Explanation    Comprehension  Verbalized understanding       PT Short Term Goals - 08/07/17 1641      PT SHORT TERM GOAL #1   Title  Pt will initiate HEP in order to indicate improved functional mobility.  (Target Date: 09/06/17)    Time  4    Period  Weeks    Status  New    Target Date  09/06/17      PT SHORT TERM GOAL #2   Title  Pt will improve 5TSS to </=17.87 secs without UE support in order to indicate decreased fall risk and improved functional strength.     Time  4    Period  Weeks    Status  New      PT SHORT TERM GOAL #3   Title  Pt will improve DGI to 18/24 in order to indicate decreased fall risk.     Time  4    Period  Weeks    Status  New      PT SHORT TERM GOAL #4   Title  Pt will improve gait speed to >/=2.62 ft/sec at mod I level in order to indicate safe community ambulation.     Time  4    Period  Weeks    Status  New      PT SHORT TERM GOAL #5   Title  Pt will ambulate 500' over unlevel paved outdoor surfaces w/ LRAD in order to indicate safe community negotiation.     Time  4    Period  Weeks    Status  New        PT Long Term Goals - 08/07/17 1644      PT LONG TERM GOAL #1   Title  Pt will be independent with HEP in order to indicate improved functional mobility.  (Target Date: 10/06/17)    Time  8    Period  Weeks    Status  New    Target Date  10/06/17      PT LONG TERM GOAL #2   Title  Pt will perform 5TSS </=14.87 secs without UE support in order to indicate improved functional strength and decreased fall risk.      Time  8    Period  Weeks    Status  New      PT LONG TERM GOAL #3   Title  Pt will improve DGI to >/=21/24 in order to indicate decreased fall risk.     Time  8    Status  New      PT LONG TERM GOAL #4   Title  Pt will ambulate x 1000' over varying outdoor  surfaces w/ LRAD at mod I level in order to indicate safe community and leisure activity.      Time  8    Period  Weeks    Status  New            Plan - 09/01/17 1253    Clinical Impression Statement  Skilled session continues to focus on LE flexibility, core strengthening, and balance on compliant surfaces.  Pt with marked difficulty with posterior pelvic tilt, therefore did not add to HEP at this time.  Also provided education on wrist sprain.      Rehab Potential  Good    Clinical Impairments Affecting Rehab Potential  co-morbidities    PT Frequency  2x / week this is recommendation, she may only be able to do 1    PT Duration  8 weeks    PT Treatment/Interventions  ADLs/Self Care Home Management;DME Instruction;Gait training;Stair training;Functional mobility training;Therapeutic activities;Therapeutic exercise;Neuromuscular re-education;Patient/family education;Orthotic Fit/Training;Passive range of motion;Vestibular    PT Next Visit Plan  continue to work on strengthening and stretching    Consulted and Agree with Plan of Care  Patient       Patient will benefit from skilled therapeutic intervention in order to improve the following deficits and impairments:  Decreased activity tolerance, Decreased balance, Decreased endurance, Decreased knowledge of use of DME, Decreased mobility, Decreased strength, Difficulty walking, Impaired flexibility, Postural dysfunction, Pain, Impaired sensation(pain to be addressed with therex)  Visit Diagnosis: Unsteadiness on feet  Muscle weakness (generalized)  Other abnormalities of gait and mobility  Chronic bilateral low back pain without sciatica  Other disturbances of skin sensation     Problem List Patient Active Problem List   Diagnosis Date Noted  . Alcoholic peripheral neuropathy ([place]) 06/09/2017  . Diabetic polyneuropathy associated with diabetes mellitus due to underlying condition ([place]) 06/09/2017  . Tobacco use 06/09/2017   . Foot pain, bilateral 01/18/2017  . Moderate episode of recurrent major depressive disorder ([place]) 05/04/2016  . Breast cancer screening 01/21/2016  . Smoking 09/25/2014  . Chronic pain syndrome 09/25/2014  . Callus of foot 08/19/2014  . Type 2 diabetes mellitus with diabetic polyneuropathy, without long-term current use of insulin ([place]) 06/30/2014  . Depression 06/30/2014  . Peripheral neuropathy 05/09/2013  . Poor dentition 02/01/2013  . Pain in joint, ankle and foot 02/01/2013  . Essential hypertension, benign 02/01/2013  . FOOT PAIN, BILATERAL 06/16/2010  . RECTAL BLEEDING 12/08/2009  . HEMATURIA UNSPECIFIED 12/08/2009  . DEPRESSION 08/20/2009  . ABSCESS, TOOTH 08/20/2009  . HYPERLIPIDEMIA 07/30/2009  . SICKLE-CELL TRAIT 07/30/2009  . ANEMIA-NOS 07/30/2009  . ALCOHOLISM 07/30/2009  . HYPERTENSION 07/30/2009  . GERD 07/30/2009  . CALLUS, FOOT 07/30/2009    Cameron Sprang, PT, MPT  [facility] [address] [place], [place], [address] Phone: [phone]   Fax:  [phone] 09/01/17, 12:55 PM  Name: Kendra Griffith MRN: [id number] Date of Birth: 05-Jun-1965

## 2017-09-01 NOTE — Patient Instructions (Signed)
nee-to-Chest Stretch: Unilateral    With hand behind right knee, pull knee in to chest until a comfortable stretch is felt in lower back and buttocks. Keep back relaxed. Hold _30_ seconds.  Repeat with left leg. Hold for 30 seconds.  Repeat _3_ times on each leg. Do _1-2_ sessions per day.  http://orth.exer.us/127   Copyright  VHI. All rights reserved.   Knee Roll    Lying on back, with knees bent and feet flat on floor, arms outstretched to sides, slowly roll both knees to side, hold 30 seconds.   Then to opposite side, hold 30 seconds. Return to starting position.   Keep shoulders and arms in contact with floor.

## 2017-09-06 ENCOUNTER — Ambulatory Visit: Payer: No Typology Code available for payment source | Admitting: Physical Therapy

## 2017-09-08 ENCOUNTER — Encounter: Payer: Self-pay | Admitting: Rehabilitation

## 2017-09-08 ENCOUNTER — Ambulatory Visit: Payer: No Typology Code available for payment source | Admitting: Rehabilitation

## 2017-09-08 DIAGNOSIS — R2681 Unsteadiness on feet: Secondary | ICD-10-CM

## 2017-09-08 DIAGNOSIS — R2689 Other abnormalities of gait and mobility: Secondary | ICD-10-CM

## 2017-09-08 DIAGNOSIS — M6281 Muscle weakness (generalized): Secondary | ICD-10-CM

## 2017-09-08 DIAGNOSIS — R208 Other disturbances of skin sensation: Secondary | ICD-10-CM

## 2017-09-08 NOTE — Therapy (Signed)
[facility] [address] [place], [place], [address] Phone: [phone]   Fax:  [phone]  Physical Therapy Treatment  Patient Details  Name: Kendra Griffith MRN: [id number] Date of Birth: 09-11-1965 Referring Provider: Narda Amber, MD   Encounter Date: 09/08/2017  PT End of Session - 09/08/17 1325    Visit Number  6    Number of Visits  17    Date for PT Re-Evaluation  10/06/17    Authorization Type  CAFA approved 100% discount    PT Start Time  4034    PT Stop Time  1400    PT Time Calculation (min)  43 min    Equipment Utilized During Treatment  Gait belt    Activity Tolerance  Patient tolerated treatment well;No increased pain;Patient limited by pain    Behavior During Therapy  [facility] for tasks assessed/performed       Past Medical History:  Diagnosis Date  . Anemia   . Anxiety   . Blood transfusion without reported diagnosis    childbirth  . Clotting disorder ([place])    childbirth  . Depression   . Diabetes mellitus without complication (HCC)    prediabetes  . Hypertension   . Injury    right side from jumping off bunkbed  . Pre-diabetes     Past Surgical History:  Procedure Laterality Date  . CESAREAN SECTION     three  . cyst removal 1997    . FACIAL RECONSTRUCTION SURGERY    . FOOT SURGERY Bilateral     There were no vitals filed for this visit.  Subjective Assessment - 09/08/17 1323    Subjective  Pt reports she has a family friend dying of prostate cancer and that she has been visiting him, has not been eating/drinking enough lately.     Limitations  House hold activities;Walking;Standing    How long can you stand comfortably?  2 1/2 hours (back and feet pain)    How long can you walk comfortably?  2 hours    Patient Stated Goals  "I want to roller skate with my grandkids."     Currently in Pain?  Yes    Pain Score  7     Pain Location  Foot    Pain Orientation  Right;Left R>L     Pain Descriptors / Indicators  Aching    Pain Type  Chronic pain    Pain Onset  More than a month ago    Pain Frequency  Constant    Aggravating Factors   unsure    Pain Relieving Factors  nothing    Multiple Pain Sites  Yes    Pain Score  6    Pain Location  Back    Pain Orientation  Lower    Pain Descriptors / Indicators  Aching;Sharp    Pain Type  Chronic pain    Pain Onset  More than a month ago    Pain Frequency  Constant    Aggravating Factors   walking more, sitting in uncomfortable chair    Pain Relieving Factors  rest                       [facility] Adult PT Treatment/Exercise - 09/08/17 1336      Ambulation/Gait   Ambulation/Gait  Yes    Ambulation/Gait Assistance  6: Modified independent (Device/Increase time)    Ambulation Distance (Feet)  600 Feet    Assistive device  None    Gait Pattern  Step-through pattern;Decreased stride length;Antalgic;Trunk flexed    Ambulation Surface  Level;Unlevel;Indoor;Outdoor;Paved;Gravel;Grass    Gait velocity  3.46 ft/sec without device at mod I level.     Stairs  Yes    Stairs Assistance  5: Supervision    Stair Management Technique  No rails;Alternating pattern;Forwards    Number of Stairs  4    Height of Stairs  6    Gait Comments  Pt able to ambulate over varying outdoor surfaces without device at mod I level during session including up/down curb step.       Standardized Balance Assessment   Standardized Balance Assessment  Dynamic Gait Index      Dynamic Gait Index   Level Surface  Normal    Change in Gait Speed  Mild Impairment    Gait with Horizontal Head Turns  Mild Impairment    Gait with Vertical Head Turns  Normal    Gait and Pivot Turn  Normal    Step Over Obstacle  Mild Impairment    Step Around Obstacles  Normal    Steps  Normal    Total Score  21      Self-Care   Self-Care  Other Self-Care Comments    Other Self-Care Comments   Provided education on importance of staying hydrating, eating  appropriate meals and getting enough rest for overall care.  Pt verbalizes understanding.  Also provided education on progress with therapy.        Neuro Re-ed    Neuro Re-ed Details   Reviewed balance HEP, see pt instruction for details on exercises performed.               PT Education - 09/08/17 1325    Education provided  Yes    Education Details  See self care    Person(s) Educated  Patient    Methods  Explanation    Comprehension  Verbalized understanding       PT Short Term Goals - 09/08/17 1325      PT SHORT TERM GOAL #1   Title  Pt will initiate HEP in order to indicate improved functional mobility.  (Target Date: 09/06/17)    Baseline  Pt has HEP for strengthening as well as balance, however is only intermittently performing at home.     Time  4    Period  Weeks    Status  Achieved      PT SHORT TERM GOAL #2   Title  Pt will improve 5TSS to </=17.87 secs without UE support in order to indicate decreased fall risk and improved functional strength.     Baseline  12.35 secs without support    Time  4    Period  Weeks    Status  Achieved      PT SHORT TERM GOAL #3   Title  Pt will improve DGI to 18/24 in order to indicate decreased fall risk.     Baseline  21/24 on 09/08/17    Time  4    Period  Weeks    Status  Achieved      PT SHORT TERM GOAL #4   Title  Pt will improve gait speed to >/=2.62 ft/sec at mod I level in order to indicate safe community ambulation.     Baseline  3.46 ft/sec at mod I level on 09/08/17    Time  4    Period  Weeks    Status  Achieved  PT SHORT TERM GOAL #5   Title  Pt will ambulate 500' over unlevel paved outdoor surfaces w/ LRAD in order to indicate safe community negotiation.     Baseline  met 09/08/17    Time  4    Period  Weeks    Status  Achieved        PT Long Term Goals - 09/08/17 2100      PT LONG TERM GOAL #1   Title  Pt will be independent with HEP in order to indicate improved functional mobility.  (Target  Date: 10/06/17)    Time  8    Period  Weeks    Status  New      PT LONG TERM GOAL #2   Title  Pt will perform 5TSS </=10 secs without UE support in order to indicate improved functional strength and decreased fall risk.      Time  8    Period  Weeks    Status  Revised      PT LONG TERM GOAL #3   Title  Pt will improve DGI to >/=21/24 in order to indicate decreased fall risk.     Time  8    Status  Achieved      PT LONG TERM GOAL #4   Title  Pt will ambulate x 1000' over varying outdoor surfaces w/ LRAD at mod I level in order to indicate safe community and leisure activity.      Time  8    Period  Weeks    Status  New      PT LONG TERM GOAL #5   Title  Pt will perform FGA and improve score by 3 points in order to indicate decreased fall risk.     Time  8    Period  Weeks    Status  New            Plan - 09/08/17 2100    Clinical Impression Statement  Skilled session focused on assessment of STGs.  Pt has met 5/5 STGs and has also met LTG for DGI, therefore will plan to assess FGA at next session and updates goals as needed.     Rehab Potential  Good    Clinical Impairments Affecting Rehab Potential  co-morbidities    PT Frequency  2x / week this is recommendation, she may only be able to do 1    PT Duration  8 weeks    PT Treatment/Interventions  ADLs/Self Care Home Management;DME Instruction;Gait training;Stair training;Functional mobility training;Therapeutic activities;Therapeutic exercise;Neuromuscular re-education;Patient/family education;Orthotic Fit/Training;Passive range of motion;Vestibular    PT Next Visit Plan  Assess FGA-make sure goal is appropriate. continue to work on Engineer, civil (consulting) and Agree with Plan of Care  Patient       Patient will benefit from skilled therapeutic intervention in order to improve the following deficits and impairments:  Decreased activity tolerance, Decreased balance, Decreased endurance, Decreased  knowledge of use of DME, Decreased mobility, Decreased strength, Difficulty walking, Impaired flexibility, Postural dysfunction, Pain, Impaired sensation(pain to be addressed with therex)  Visit Diagnosis: Unsteadiness on feet  Muscle weakness (generalized)  Other abnormalities of gait and mobility  Other disturbances of skin sensation     Problem List Patient Active Problem List   Diagnosis Date Noted  . Alcoholic peripheral neuropathy ([place]) 06/09/2017  . Diabetic polyneuropathy associated with diabetes mellitus due to underlying condition ([place]) 06/09/2017  . Tobacco use 06/09/2017  . Foot pain,  bilateral 01/18/2017  . Moderate episode of recurrent major depressive disorder ([place]) 05/04/2016  . Breast cancer screening 01/21/2016  . Smoking 09/25/2014  . Chronic pain syndrome 09/25/2014  . Callus of foot 08/19/2014  . Type 2 diabetes mellitus with diabetic polyneuropathy, without long-term current use of insulin ([place]) 06/30/2014  . Depression 06/30/2014  . Peripheral neuropathy 05/09/2013  . Poor dentition 02/01/2013  . Pain in joint, ankle and foot 02/01/2013  . Essential hypertension, benign 02/01/2013  . FOOT PAIN, BILATERAL 06/16/2010  . RECTAL BLEEDING 12/08/2009  . HEMATURIA UNSPECIFIED 12/08/2009  . DEPRESSION 08/20/2009  . ABSCESS, TOOTH 08/20/2009  . HYPERLIPIDEMIA 07/30/2009  . SICKLE-CELL TRAIT 07/30/2009  . ANEMIA-NOS 07/30/2009  . ALCOHOLISM 07/30/2009  . HYPERTENSION 07/30/2009  . GERD 07/30/2009  . CALLUS, FOOT 07/30/2009    Cameron Sprang, PT, MPT [facility] [address] [place], [place], [address] Phone: [phone]   Fax:  [phone] 09/08/17, 9:05 PM  Name: ROSAMAE ROCQUE MRN: [id number] Date of Birth: 07-Dec-1965

## 2017-09-08 NOTE — Patient Instructions (Signed)
Perform these in the corner with a chair in front for safety: Feet Together (Compliant Surface) Varied Arm Positions - Eyes Closed    Stand on compliant surface: _pillow/s__ with feet together and arms out. Close eyes and visualize upright position. Hold_30_ seconds. Repeat _3_ times per session. Do _1-2_ sessions per day.  Copyright  VHI. All rights reserved.    Feet Together (Compliant Surface) Head Motion - Eyes Closed    Stand on compliant surface: ___pillow_____ with feet together. Close eyes and move head slowly, up and down x 10 reps, side to side x 10 reps, diagonally in both directions x 10 reps.  Repeat __1__ times per session. Do __1-2__ sessions per day.  Copyright  VHI. All rights reserved.    Do _1-2_ sessions per day.  Copyright  VHI. All rights reserved.     TANDEM STANCE WITH SUPPORT  With hand support:  place the heel of one foot so that it is touching the toes of the other foot. Maintain your balance in this position for 20 seconds while trying to lift hands off chair as able.  Switch feet so that the other foot is in front and repeat. Holding balance while trying to lift hands of chair.  Add the pillow to this exercise, so do standing on pillow!  Perform 2 reps with each foot forward. 1-2 times a day.   Single Leg - Eyes Open    Holding support, lift right leg while maintaining balance over left leg. Progress to removing hands from support surface for longer periods of time. Hold_10-20_ seconds.  Then switch: standing on right leg, lift the left leg up. Progress to removing hands from support surface as able. Hold for 10-20 seconds.  Do this standing on pillow!!  Repeat __3__ times standing on each leg. Do _1-2_ sessions per day.  Copyright  VHI. All rights reserved.   Perform these at a counter or table for support/balance assistance as needed:   "I love a Parade" Lift   At counter for balance as needed: high knee marching  forward and then backward. 3 second pauses with each knee lift.  Repeat 3 laps each way. Do _1-2_ sessions per day. http://gt2.exer.us/345   Copyright  VHI. All rights reserved.      Crossovers    Move to side: 1) cross right leg in front, then 2) bring back leg out to side. Repeat the front cross over stepping along the counter/table to the end.   Then repeat the process of crossing the outside leg in front of the other along counter/table back to starting position.  Perform 2-3 laps along counter in each direction. 1-2 time a day.

## 2017-09-12 ENCOUNTER — Encounter: Payer: Self-pay | Admitting: Physical Therapy

## 2017-09-12 ENCOUNTER — Ambulatory Visit: Payer: Self-pay | Attending: Neurology | Admitting: Physical Therapy

## 2017-09-12 DIAGNOSIS — R2681 Unsteadiness on feet: Secondary | ICD-10-CM

## 2017-09-12 DIAGNOSIS — G8929 Other chronic pain: Secondary | ICD-10-CM | POA: Insufficient documentation

## 2017-09-12 DIAGNOSIS — M545 Low back pain, unspecified: Secondary | ICD-10-CM

## 2017-09-12 DIAGNOSIS — R2689 Other abnormalities of gait and mobility: Secondary | ICD-10-CM

## 2017-09-12 DIAGNOSIS — M6281 Muscle weakness (generalized): Secondary | ICD-10-CM

## 2017-09-13 NOTE — Therapy (Signed)
[facility] [address] [place], [place], [address] Phone: [phone]   Fax:  [phone]  Physical Therapy Treatment  Patient Details  Name: Kendra Griffith MRN: [id number] Date of Birth: 23-Jun-1965 Referring Provider: Narda Amber, MD   Encounter Date: 09/12/2017     09/12/17 1157  PT Visits / Re-Eval  Visit Number 7  Number of Visits 17  Date for PT Re-Evaluation 10/06/17  Authorization  Authorization Type CAFA approved 100% discount  PT Time Calculation  PT Start Time 1149  PT Stop Time 1230  PT Time Calculation (min) 41 min  PT - End of Session  Equipment Utilized During Treatment Gait belt  Activity Tolerance Patient tolerated treatment well;No increased pain;Patient limited by pain  Behavior During Therapy [facility] for tasks assessed/performed    Past Medical History:  Diagnosis Date  . Anemia   . Anxiety   . Blood transfusion without reported diagnosis    childbirth  . Clotting disorder ([place])    childbirth  . Depression   . Diabetes mellitus without complication (HCC)    prediabetes  . Hypertension   . Injury    right side from jumping off bunkbed  . Pre-diabetes     Past Surgical History:  Procedure Laterality Date  . CESAREAN SECTION     three  . cyst removal 1997    . FACIAL RECONSTRUCTION SURGERY    . FOOT SURGERY Bilateral     There were no vitals filed for this visit.     09/12/17 1152  Symptoms/Limitations  Subjective Reporting that she wants [place] to address her back pain more vs her balance.  No falls.   Limitations House hold activities;Walking;Standing  How long can you stand comfortably? 2 1/2 hours (back and feet pain)  How long can you walk comfortably? 2 hours  Patient Stated Goals "I want to roller skate with my grandkids."   Pain Assessment  Currently in Pain? Yes  Pain Score 6  Pain Location Back  Pain Orientation Lower  Pain Descriptors / Indicators Aching;Sharp   Pain Type Chronic pain  Pain Onset More than a month ago  Pain Frequency Constant  Aggravating Factors  bending over. prolonged sittting and standing  Pain Relieving Factors rest; tylenol 3 when really bad, otherwise ibuprofen      09/12/17 1158  Self-Care  Self-Care Other Self-Care Comments  Other Self-Care Comments  pt requesting to address her back pain more than her balance. Discussed her referral to [place] was to address balance and at this time her back pain was not included. Discussed this would need to be added before we could focus on her back. Also discussed she has been referred to neurology as well for consult on her back and we may need to wait until that happens before addressing her back pain.     Exercises  Exercises Other Exercises  Other Exercises  hooklying on mat table: single knee to chest stretch for 30 sec's x 3 each side, double knee to chest stretch for 30 sec's x 3 reps, with LE's over red ball lower trunk rotation left<>right for 15 sec holds x3 reps each side. seated at edge of mat: hamstring stretch for 30 sec's x 2 reps each side. pain in back rated at 7/10 afterwards.      09/12/17 1220  Functional Gait  Assessment  Gait assessed  Yes  Gait Level Surface 1  Change in Gait Speed 2  Gait with Horizontal Head Turns  2  Gait with Vertical Head Turns 2  Gait and Pivot Turn 1  Step Over Obstacle 1  Gait with Narrow Base of Support 1  Gait with Eyes Closed 1  Ambulating Backwards 1  Steps 3  Total Score 15      PT Short Term Goals - 09/08/17 1325      PT SHORT TERM GOAL #1   Title  Pt will initiate HEP in order to indicate improved functional mobility.  (Target Date: 09/06/17)    Baseline  Pt has HEP for strengthening as well as balance, however is only intermittently performing at home.     Time  4    Period  Weeks    Status  Achieved      PT SHORT TERM GOAL #2   Title  Pt will improve 5TSS to </=17.87 secs without UE support in order to indicate  decreased fall risk and improved functional strength.     Baseline  12.35 secs without support    Time  4    Period  Weeks    Status  Achieved      PT SHORT TERM GOAL #3   Title  Pt will improve DGI to 18/24 in order to indicate decreased fall risk.     Baseline  21/24 on 09/08/17    Time  4    Period  Weeks    Status  Achieved      PT SHORT TERM GOAL #4   Title  Pt will improve gait speed to >/=2.62 ft/sec at mod I level in order to indicate safe community ambulation.     Baseline  3.46 ft/sec at mod I level on 09/08/17    Time  4    Period  Weeks    Status  Achieved      PT SHORT TERM GOAL #5   Title  Pt will ambulate 500' over unlevel paved outdoor surfaces w/ LRAD in order to indicate safe community negotiation.     Baseline  met 09/08/17    Time  4    Period  Weeks    Status  Achieved        PT Long Term Goals - 09/08/17 2100      PT LONG TERM GOAL #1   Title  Pt will be independent with HEP in order to indicate improved functional mobility.  (Target Date: 10/06/17)    Time  8    Period  Weeks    Status  New      PT LONG TERM GOAL #2   Title  Pt will perform 5TSS </=10 secs without UE support in order to indicate improved functional strength and decreased fall risk.      Time  8    Period  Weeks    Status  Revised      PT LONG TERM GOAL #3   Title  Pt will improve DGI to >/=21/24 in order to indicate decreased fall risk.     Time  8    Status  Achieved      PT LONG TERM GOAL #4   Title  Pt will ambulate x 1000' over varying outdoor surfaces w/ LRAD at mod I level in order to indicate safe community and leisure activity.      Time  8    Period  Weeks    Status  New      PT LONG TERM GOAL #5   Title  Pt will perform FGA and  improve score by 3 points in order to indicate decreased fall risk.     Time  8    Period  Weeks    Status  New          09/12/17 1157  Plan  Clinical Impression Statement Today's skilled session initially focused on LE/lumar  stretching/strengthening for decreased pain. Remainder of session focused on performance of FGA to establish baseline values. Primary PT to set goals based on this score as needed. Pt continues to be limited by pain, will continued to focus on balance reactions as able.   Pt will benefit from skilled therapeutic intervention in order to improve on the following deficits Decreased activity tolerance;Decreased balance;Decreased endurance;Decreased knowledge of use of DME;Decreased mobility;Decreased strength;Difficulty walking;Impaired flexibility;Postural dysfunction;Pain;Impaired sensation (pain to be addressed with therex)  Rehab Potential Good  Clinical Impairments Affecting Rehab Potential co-morbidities  PT Frequency 2x / week (this is recommendation, she may only be able to do 1)  PT Duration 8 weeks  PT Treatment/Interventions ADLs/Self Care Home Management;DME Instruction;Gait training;Stair training;Functional mobility training;Therapeutic activities;Therapeutic exercise;Neuromuscular re-education;Patient/family education;Orthotic Fit/Training;Passive range of motion;Vestibular  PT Next Visit Plan primary PT to ensure FGA goal is appropriate at next visit.  continue to work on strengthening and stretching, balance activities  Consulted and Agree with Plan of Care Patient       Patient will benefit from skilled therapeutic intervention in order to improve the following deficits and impairments:  Decreased activity tolerance, Decreased balance, Decreased endurance, Decreased knowledge of use of DME, Decreased mobility, Decreased strength, Difficulty walking, Impaired flexibility, Postural dysfunction, Pain, Impaired sensation(pain to be addressed with therex)  Visit Diagnosis: Unsteadiness on feet  Muscle weakness (generalized)  Other abnormalities of gait and mobility  Chronic bilateral low back pain without sciatica     Problem List Patient Active Problem List   Diagnosis Date  Noted  . Alcoholic peripheral neuropathy ([place]) 06/09/2017  . Diabetic polyneuropathy associated with diabetes mellitus due to underlying condition ([place]) 06/09/2017  . Tobacco use 06/09/2017  . Foot pain, bilateral 01/18/2017  . Moderate episode of recurrent major depressive disorder ([place]) 05/04/2016  . Breast cancer screening 01/21/2016  . Smoking 09/25/2014  . Chronic pain syndrome 09/25/2014  . Callus of foot 08/19/2014  . Type 2 diabetes mellitus with diabetic polyneuropathy, without long-term current use of insulin ([place]) 06/30/2014  . Depression 06/30/2014  . Peripheral neuropathy 05/09/2013  . Poor dentition 02/01/2013  . Pain in joint, ankle and foot 02/01/2013  . Essential hypertension, benign 02/01/2013  . FOOT PAIN, BILATERAL 06/16/2010  . RECTAL BLEEDING 12/08/2009  . HEMATURIA UNSPECIFIED 12/08/2009  . DEPRESSION 08/20/2009  . ABSCESS, TOOTH 08/20/2009  . HYPERLIPIDEMIA 07/30/2009  . SICKLE-CELL TRAIT 07/30/2009  . ANEMIA-NOS 07/30/2009  . ALCOHOLISM 07/30/2009  . HYPERTENSION 07/30/2009  . GERD 07/30/2009  . CALLUS, FOOT 07/30/2009    Willow Ora, PTA, [facility] [address] [place], [place] [address] [phone] 09/13/17, 11:20 PM   Name: TANAYA DUNIGAN MRN: [id number] Date of Birth: 09-16-1965

## 2017-09-15 ENCOUNTER — Ambulatory Visit: Payer: Self-pay | Admitting: Rehabilitation

## 2017-09-19 ENCOUNTER — Ambulatory Visit: Payer: Self-pay | Admitting: Physical Therapy

## 2017-09-19 DIAGNOSIS — I10 Essential (primary) hypertension: Secondary | ICD-10-CM

## 2017-09-22 ENCOUNTER — Ambulatory Visit: Payer: Self-pay | Admitting: Physical Therapy

## 2017-09-26 ENCOUNTER — Encounter: Payer: Self-pay | Admitting: Physical Therapy

## 2017-09-26 ENCOUNTER — Ambulatory Visit: Payer: Self-pay | Admitting: Physical Therapy

## 2017-09-26 DIAGNOSIS — R2681 Unsteadiness on feet: Secondary | ICD-10-CM

## 2017-09-26 DIAGNOSIS — R2689 Other abnormalities of gait and mobility: Secondary | ICD-10-CM

## 2017-09-26 DIAGNOSIS — M545 Low back pain: Secondary | ICD-10-CM

## 2017-09-26 DIAGNOSIS — G8929 Other chronic pain: Secondary | ICD-10-CM

## 2017-09-26 DIAGNOSIS — M6281 Muscle weakness (generalized): Secondary | ICD-10-CM

## 2017-09-27 NOTE — Therapy (Addendum)
[facility] [address] [place], [place], [address] Phone: [phone]   Fax:  [phone]  Physical Therapy Treatment and D/C Summary  Patient Details  Name: Kendra Griffith MRN: [id number] Date of Birth: 1965-05-29 Referring Provider: Narda Amber, MD   Encounter Date: 09/26/2017    09/26/17 1155  PT Visits / Re-Eval  Visit Number 8  Number of Visits 17  Date for PT Re-Evaluation 10/06/17  Authorization  Authorization Type CAFA approved 100% discount  PT Time Calculation  PT Start Time 1149  PT Stop Time 1230  PT Time Calculation (min) 41 min  PT - End of Session  Equipment Utilized During Treatment Gait belt  Activity Tolerance Patient tolerated treatment well;No increased pain;Patient limited by pain  Behavior During Therapy [facility] for tasks assessed/performed     Past Medical History:  Diagnosis Date  . Anemia   . Anxiety   . Blood transfusion without reported diagnosis    childbirth  . Clotting disorder ([place])    childbirth  . Depression   . Diabetes mellitus without complication (HCC)    prediabetes  . Hypertension   . Injury    right side from jumping off bunkbed  . Pre-diabetes     Past Surgical History:  Procedure Laterality Date  . CESAREAN SECTION     three  . cyst removal 1997    . FACIAL RECONSTRUCTION SURGERY    . FOOT SURGERY Bilateral     There were no vitals filed for this visit.     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 Also had a fall last week in her hallway. Stumbled and landed head 1st into the door. Reports she hurt her right wrist and had a knot on her forhead. Had a slight headache that lasted for about 30 minutes to an hour. Has not reoccured since. Did not see a doctor about it.    Limitations House hold activities;Walking;Standing  How long can you stand comfortably? 2 1/2 hours (back and feet pain)  How long can you walk comfortably? 2 hours  Patient Stated Goals "I want to roller skate with my grandkids."   Pain Assessment  Currently in Pain? Yes  Pain Score 8  Pain Location Back  Pain Descriptors / Indicators Aching;Sharp  Pain Type Chronic pain  Pain Radiating Towards radiating into her sides  Pain Onset More than a month ago  Pain Frequency Constant  Aggravating Factors  bending over, prolonged sitting and standing  Pain Relieving Factors rest, tylenol 3 when really bad, otherwise ibuprofen      09/26/17 1158  Exercises  Exercises Other Exercises  Other Exercises  hooklying on mat table: single knee to chest stretch for 30 sec's x 3 each side, double knee to chest stretch for 30 sec's x 3 reps, lower trunk rotation stretch left<>right for 15 sec holds x3 reps each side. for gentle strengthening: worked on posterior pelvic tilts. pt uable to perform with verbal/tactile cues, therefore utilized BP cuff with visual feedback via needle on guage to assist with correct form/technique. pt performed 10 reps with 5 sec holds in this manner. progressed to bridging- visual, then verbal/tactile cues needed for correct form/abd bracing. pt with minimal clearance off mat that did progressively increase as reps progressed for the 10 reps. in  hookling with red band around legs just above knees: clamshells with 5 sec holds x 10 reps using red band, cues for abd bracing and equal LE pull on band.  ended with seated hamstring stretching at edge of mat for 20 sec holds x 2 reps each leg.                        PT Short Term Goals - 09/08/17 1325      PT SHORT TERM GOAL #1   Title  Pt will initiate HEP in order to indicate improved functional mobility.  (Target Date: 09/06/17)    Baseline  Pt has HEP for strengthening as well as balance, however is only intermittently performing  at home.     Time  4    Period  Weeks    Status  Achieved      PT SHORT TERM GOAL #2   Title  Pt will improve 5TSS to </=17.87 secs without UE support in order to indicate decreased fall risk and improved functional strength.     Baseline  12.35 secs without support    Time  4    Period  Weeks    Status  Achieved      PT SHORT TERM GOAL #3   Title  Pt will improve DGI to 18/24 in order to indicate decreased fall risk.     Baseline  21/24 on 09/08/17    Time  4    Period  Weeks    Status  Achieved      PT SHORT TERM GOAL #4   Title  Pt will improve gait speed to >/=2.62 ft/sec at mod I level in order to indicate safe community ambulation.     Baseline  3.46 ft/sec at mod I level on 09/08/17    Time  4    Period  Weeks    Status  Achieved      PT SHORT TERM GOAL #5   Title  Pt will ambulate 500' over unlevel paved outdoor surfaces w/ LRAD in order to indicate safe community negotiation.     Baseline  met 09/08/17    Time  4    Period  Weeks    Status  Achieved        PT Long Term Goals - 09/08/17 2100      PT LONG TERM GOAL #1   Title  Pt will be independent with HEP in order to indicate improved functional mobility.  (Target Date: 10/06/17)    Time  8    Period  Weeks    Status  New      PT LONG TERM GOAL #2   Title  Pt will perform 5TSS </=10 secs without UE support in order to indicate improved functional strength and decreased fall risk.      Time  8    Period  Weeks    Status  Revised      PT LONG TERM GOAL #3   Title  Pt will improve DGI to >/=21/24 in order to indicate decreased fall risk.     Time  8    Status  Achieved      PT LONG TERM GOAL #4   Title  Pt will ambulate x 1000' over varying outdoor surfaces w/ LRAD at mod I level in order to indicate safe community and leisure activity.      Time  8    Period  Weeks  Status  New      PT LONG TERM GOAL #5   Title  Pt will perform FGA and improve score by 3 points in order to indicate decreased fall  risk.     Time  8    Period  Weeks    Status  New         09/26/17 1155  Plan  Clinical Impression Statement Today's skilled session continued to focus on decreased lumbar/LE pain and on strengthening. No issues reported with session with pt making a steady progress toward goals.  The pt. should benefit from continued PT  to progress toward unmet goals.                       Pt will benefit from skilled therapeutic intervention in order to improve on the following deficits Decreased activity tolerance;Decreased balance;Decreased endurance;Decreased knowledge of use of DME;Decreased mobility;Decreased strength;Difficulty walking;Impaired flexibility;Postural dysfunction;Pain;Impaired sensation (pain to be addressed with therex)  Rehab Potential Good  Clinical Impairments Affecting Rehab Potential co-morbidities  PT Frequency 2x / week (this is recommendation, she may only be able to do 1)  PT Duration 8 weeks  PT Treatment/Interventions ADLs/Self Care Home Management;DME Instruction;Gait training;Stair training;Functional mobility training;Therapeutic activities;Therapeutic exercise;Neuromuscular re-education;Patient/family education;Orthotic Fit/Training;Passive range of motion;Vestibular  PT Next Visit Plan primary PT to ensure FGA goal is appropriate at next visit.  continue to work on strengthening and stretching, balance activities  Consulted and Agree with Plan of Care Patient     Patient will benefit from skilled therapeutic intervention in order to improve the following deficits and impairments:  Decreased activity tolerance, Decreased balance, Decreased endurance, Decreased knowledge of use of DME, Decreased mobility, Decreased strength, Difficulty walking, Impaired flexibility, Postural dysfunction, Pain, Impaired sensation(pain to be addressed with therex)  Visit Diagnosis: Unsteadiness on feet  Muscle weakness (generalized)  Other abnormalities of gait and mobility  Chronic  bilateral low back pain without sciatica  PHYSICAL THERAPY DISCHARGE SUMMARY  Visits from Start of Care: 9  Current functional level related to goals / functional outcomes: Unsure as pt did not return for follow up    Remaining deficits: See LTGs above    Education / Equipment: HEP  Plan: Patient agrees to discharge.  Patient goals were not met. Patient is being discharged due to not returning since the last visit.  ?????      DC summary completed by:  Cameron Sprang, PT, MPT [facility] [address] [place], [place], [address] Phone: [phone]   Fax:  [phone] 04/23/18, 12:38 PM   Problem List Patient Active Problem List   Diagnosis Date Noted  . Alcoholic peripheral neuropathy ([place]) 06/09/2017  . Diabetic polyneuropathy associated with diabetes mellitus due to underlying condition ([place]) 06/09/2017  . Tobacco use 06/09/2017  . Foot pain, bilateral 01/18/2017  . Moderate episode of recurrent major depressive disorder ([place]) 05/04/2016  . Breast cancer screening 01/21/2016  . Smoking 09/25/2014  . Chronic pain syndrome 09/25/2014  . Callus of foot 08/19/2014  . Type 2 diabetes mellitus with diabetic polyneuropathy, without long-term current use of insulin ([place]) 06/30/2014  . Depression 06/30/2014  . Peripheral neuropathy 05/09/2013  . Poor dentition 02/01/2013  . Pain in joint, ankle and foot 02/01/2013  . Essential hypertension, benign 02/01/2013  . FOOT PAIN, BILATERAL 06/16/2010  . RECTAL BLEEDING 12/08/2009  . HEMATURIA UNSPECIFIED 12/08/2009  . DEPRESSION 08/20/2009  . ABSCESS, TOOTH 08/20/2009  . HYPERLIPIDEMIA 07/30/2009  . SICKLE-CELL  TRAIT 07/30/2009  . ANEMIA-NOS 07/30/2009  . ALCOHOLISM 07/30/2009  . HYPERTENSION 07/30/2009  . GERD 07/30/2009  . CALLUS, FOOT 07/30/2009    Willow Ora, PTA, [facility] [address] [place], [place] [address] [phone] 09/27/17,  10:30 PM   Name: BEONKA AMESQUITA MRN: [id number] Date of Birth: 25-Feb-1966

## 2017-09-29 ENCOUNTER — Ambulatory Visit: Payer: Self-pay | Admitting: Rehabilitation

## 2017-10-02 ENCOUNTER — Ambulatory Visit: Payer: Self-pay | Admitting: Rehabilitation

## 2017-10-31 ENCOUNTER — Ambulatory Visit: Payer: Self-pay | Attending: Nurse Practitioner | Admitting: Nurse Practitioner

## 2017-10-31 ENCOUNTER — Encounter: Payer: Self-pay | Admitting: Nurse Practitioner

## 2017-10-31 VITALS — BP 141/85 | HR 69 | Temp 98.8°F | Ht 65.0 in | Wt 165.2 lb

## 2017-10-31 DIAGNOSIS — F4310 Post-traumatic stress disorder, unspecified: Secondary | ICD-10-CM | POA: Insufficient documentation

## 2017-10-31 DIAGNOSIS — Z880 Allergy status to penicillin: Secondary | ICD-10-CM | POA: Insufficient documentation

## 2017-10-31 DIAGNOSIS — N898 Other specified noninflammatory disorders of vagina: Secondary | ICD-10-CM | POA: Insufficient documentation

## 2017-10-31 DIAGNOSIS — Z7984 Long term (current) use of oral hypoglycemic drugs: Secondary | ICD-10-CM | POA: Insufficient documentation

## 2017-10-31 DIAGNOSIS — Z79899 Other long term (current) drug therapy: Secondary | ICD-10-CM | POA: Insufficient documentation

## 2017-10-31 DIAGNOSIS — E1142 Type 2 diabetes mellitus with diabetic polyneuropathy: Secondary | ICD-10-CM | POA: Insufficient documentation

## 2017-10-31 DIAGNOSIS — Z0000 Encounter for general adult medical examination without abnormal findings: Secondary | ICD-10-CM

## 2017-10-31 DIAGNOSIS — F1021 Alcohol dependence, in remission: Secondary | ICD-10-CM | POA: Insufficient documentation

## 2017-10-31 DIAGNOSIS — Z885 Allergy status to narcotic agent status: Secondary | ICD-10-CM | POA: Insufficient documentation

## 2017-10-31 DIAGNOSIS — I10 Essential (primary) hypertension: Secondary | ICD-10-CM | POA: Insufficient documentation

## 2017-10-31 DIAGNOSIS — F17210 Nicotine dependence, cigarettes, uncomplicated: Secondary | ICD-10-CM | POA: Insufficient documentation

## 2017-10-31 DIAGNOSIS — F319 Bipolar disorder, unspecified: Secondary | ICD-10-CM | POA: Insufficient documentation

## 2017-10-31 LAB — GLUCOSE, POCT (MANUAL RESULT ENTRY): POC GLUCOSE: 116 mg/dL — AB (ref 70–99)

## 2017-10-31 MED ORDER — METFORMIN HCL ER 500 MG PO TB24: 500.0000 mg | ORAL_TABLET | Freq: Every day | ORAL | 3 refills | Status: DC

## 2017-10-31 MED ORDER — GABAPENTIN 400 MG PO CAPS: ORAL_CAPSULE | ORAL | 3 refills | Status: DC

## 2017-10-31 MED ORDER — HYDROCHLOROTHIAZIDE 25 MG PO TABS: 25.0000 mg | ORAL_TABLET | Freq: Every day | ORAL | 3 refills | Status: DC

## 2017-10-31 MED FILL — GABAPENTIN 400 MG CAPSULE: 400 | 30 days supply | Qty: 180 | Fill #0

## 2017-10-31 MED FILL — METFORMIN HCL ER 500 MG TAB: 500 | 30 days supply | Qty: 30 | Fill #0

## 2017-10-31 MED FILL — HYDROCHLOROTHIAZIDE 25 MG T: 25 | 30 days supply | Qty: 30 | Fill #0

## 2017-10-31 NOTE — Progress Notes (Signed)
Assessment & Plan:  Kendra Griffith was seen today for follow-up.  Diagnoses and all orders for this visit:  Type 2 diabetes mellitus with diabetic polyneuropathy, without long-term current use of insulin (HCC) -     Glucose (CBG) -     gabapentin (NEURONTIN) 400 MG capsule; TAKE 2 CAPSULES BY MOUTH 3 TIMES DAILY. -     metFORMIN (GLUCOPHAGE-XR) 500 MG 24 hr tablet; Take 1 tablet (500 mg total) by mouth daily with breakfast. Continue blood sugar control as discussed in office today, low carbohydrate diet, and regular physical exercise as tolerated, 150 minutes per week (30 min each day, 5 days per week, or 50 min 3 days per week). Keep blood sugar logs with fasting goal of 90-130 mg/dl, post prandial (after you eat) less than 180.  For Hypoglycemia: BS <60 and Hyperglycemia BS >400; contact the clinic ASAP. Annual eye exams and foot exams are recommended.   Essential hypertension, benign -     hydrochlorothiazide (HYDRODIURIL) 25 MG tablet; Take 1 tablet (25 mg total) by mouth daily. -     CMP14+EGFR Continue all antihypertensives as prescribed.  Remember to bring in your blood pressure log with you for your follow up appointment.  DASH/Mediterranean Diets are healthier choices for HTN.    Alcohol dependence in remission ([place]) -     CBC  Routine adult health maintenance -     VITAMIN D 25 Hydroxy (Vit-D Deficiency, Fractures)  Vaginal discharge -     Urine cytology ancillary only    Patient has been counseled on age-appropriate routine health concerns for screening and prevention. These are reviewed and up-to-date. Referrals have been placed accordingly. Immunizations are up-to-date or declined.    Subjective:   Chief Complaint  Patient presents with  . Follow-up    Pt. is here to follow-up on diabetes.    HPI Kendra Griffith 52 y.o. female presents to office today for follow up of Diabetes Mellitus Type 2 and HTN. She is currently seeing a therapist at [facility] and was recently  diagnosed with Bipolar Disorder and PTSD. She has been prescribed abilify and is waiting for it to be send to the pharmacy.  Tobacco Dependence Not ready to quit. Longest time she quit smoking was 5 days. She is currently smoking 1ppd.  Vaginitis Patient complains of chronic abnormal vaginal discharge and discharge.  fSTI Risk: Possible STD exposure. Discharge described as: thick. Menstrual pattern: She had been bleeding irregularly. Contraception: none  CHRONIC HYPERTENSION Disease Monitoring Chronic. Not well controlled today. She is out of her HCTZ 25 mg daily.   Blood pressure range:  BP Readings from Last 3 Encounters:  10/31/17 (!) 141/85  08/01/17 130/89  06/09/17 120/84    Chest pain: no   Dyspnea: no   Claudication: no  Medication compliance: yes, taking  HCTZ '25mg'$  daily Medication Side Effects  Lightheadedness: no   Urinary frequency: no   Edema: no   Impotence: no  Preventitive Healthcare:  Exercise: no   Diet Pattern: salt not added to cooking  Salt Restriction:  no    Type 2 Diabetes Mellitus Disease course has been stable. There are no hypoglycemic symptoms. There are no hypoglycemic complications. Symptoms are stable. There are diabetic complications (polyneuropathy). Risk factors for coronary artery disease include family history, diabetes mellitus, hypertension, sedentary lifestyle. Current diabetic treatment includes metformin 500 mg daily . Patient is compliant with treatment all of the time.  An ACE inhibitor/angiotensin II receptor blocker is not being taken.  Patient does not see a podiatrist. Eye exam is not current.  Lab Results  Component Value Date   HGBA1C 6.3 05/17/2017   Review of Systems  Constitutional: Negative for fever, malaise/fatigue and weight loss.  HENT: Negative.  Negative for nosebleeds.   Eyes: Negative.  Negative for blurred vision, double vision and photophobia.  Respiratory: Negative.  Negative for cough and shortness of breath.     Cardiovascular: Negative.  Negative for chest pain, palpitations and leg swelling.  Gastrointestinal: Negative.  Negative for heartburn, nausea and vomiting.  Genitourinary:       SEE HPI  Musculoskeletal: Negative.  Negative for myalgias.  Neurological: Negative.  Negative for dizziness, focal weakness, seizures and headaches.  Psychiatric/Behavioral: Negative.  Negative for suicidal ideas.    Past Medical History:  Diagnosis Date  . Anemia   . Anxiety   . Blood transfusion without reported diagnosis    childbirth  . Clotting disorder ([place])    childbirth  . Depression   . Diabetes mellitus without complication (HCC)    prediabetes  . Hypertension   . Injury    right side from jumping off bunkbed  . Pre-diabetes     Past Surgical History:  Procedure Laterality Date  . CESAREAN SECTION     three  . cyst removal 1997    . FACIAL RECONSTRUCTION SURGERY    . FOOT SURGERY Bilateral     Family History  Problem Relation Age of Onset  . Heart disease Mother   . Asthma Sister   . Diabetes Sister   . COPD Sister   . Diabetes Sister   . Diabetes Sister   . Breast cancer Neg Hx   . Colon cancer Neg Hx     Social History Reviewed with no changes to be made today.   Outpatient Medications Prior to Visit  Medication Sig Dispense Refill  . acetaminophen-codeine (TYLENOL #3) 300-30 MG tablet Take 1 tablet by mouth every 4 (four) hours as needed. 60 tablet 0  . amitriptyline (ELAVIL) 50 MG tablet Take 1 tablet (50 mg total) by mouth at bedtime. 30 tablet 3  . Biotin (BIOTIN MAXIMUM STRENGTH) 10 MG TABS Take 5,000 mg by mouth daily.     . famotidine (PEPCID) 20 MG tablet Take 1 tablet (20 mg total) by mouth daily. 60 tablet 3  . hydrOXYzine (ATARAX/VISTARIL) 25 MG tablet Take 1 tablet (25 mg total) by mouth 3 (three) times daily as needed. 60 tablet 3  . ibuprofen (ADVIL,MOTRIN) 800 MG tablet Take 1 tablet (800 mg total) by mouth every 8 (eight) hours as needed. 60 tablet 2  .  DULoxetine (CYMBALTA) 30 MG capsule Take 1 capsule (30 mg total) by mouth daily. 30 capsule 3  . gabapentin (NEURONTIN) 400 MG capsule TAKE 2 CAPSULES BY MOUTH 3 TIMES DAILY. 180 capsule 3  . hydrochlorothiazide (HYDRODIURIL) 25 MG tablet Take 1 tablet (25 mg total) by mouth daily. 90 tablet 3  . metFORMIN (GLUCOPHAGE-XR) 500 MG 24 hr tablet Take 1 tablet (500 mg total) by mouth daily with breakfast. 90 tablet 3   No facility-administered medications prior to visit.     Allergies  Allergen Reactions  . Amoxicillin   . Darvocet [Propoxyphene N-Acetaminophen]     Makes her jittery  . Hydrocodone-Acetaminophen     REACTION: Upset stomach  . Percocet [Oxycodone-Acetaminophen]     Makes her jittery  . Valium [Diazepam] Other (See Comments)    UNKNOWN Makes pt. Feel "out of wack"  .  Hydrocodone-Acetaminophen Nausea And Vomiting  . Propoxyphene Nausea And Vomiting       Objective:    BP (!) 141/85 (BP Location: Right Arm, Patient Position: Sitting, Cuff Size: Normal)   Pulse 69   Temp 98.8 F (37.1 C) (Oral)   Ht '5\' 5"'$  (1.651 m)   Wt 165 lb 3.2 oz (74.9 kg)   SpO2 96%   BMI 27.49 kg/m  Wt Readings from Last 3 Encounters:  10/31/17 165 lb 3.2 oz (74.9 kg)  08/01/17 163 lb 12.8 oz (74.3 kg)  06/09/17 164 lb 2 oz (74.4 kg)    Physical Exam  Constitutional: She is oriented to person, place, and time. She appears well-developed and well-nourished. She is cooperative.  HENT:  Head: Normocephalic and atraumatic.  Mouth/Throat: Abnormal dentition.  Eyes: EOM are normal.  Neck: Normal range of motion.  Cardiovascular: Normal rate, regular rhythm, normal heart sounds and intact distal pulses. Exam reveals no gallop and no friction rub.  No murmur heard. Pulmonary/Chest: Effort normal and breath sounds normal. No tachypnea. No respiratory distress. She has no decreased breath sounds. She has no wheezes. She has no rhonchi. She has no rales. She exhibits no tenderness.  Abdominal:  Soft. Bowel sounds are normal.  Musculoskeletal: Normal range of motion. She exhibits no edema.  Neurological: She is alert and oriented to person, place, and time. Coordination normal.  Skin: Skin is warm and dry.  Psychiatric: She has a normal mood and affect. Her behavior is normal. Judgment and thought content normal.  Nursing note and vitals reviewed.        Patient has been counseled extensively about nutrition and exercise as well as the importance of adherence with medications and regular follow-up. The patient was given clear instructions to go to ER or return to medical center if symptoms don't improve, worsen or new problems develop. The patient verbalized understanding.   Follow-up: Return in about 3 months (around 01/31/2018) for Physical ONLY no labs.   [name], FNP-BC [facility] and [place], [place]   11/01/2017, 9:21 PM

## 2017-10-31 NOTE — Patient Instructions (Signed)
Health Risks of Smoking Smoking cigarettes is very bad for your health. Tobacco smoke has over 200 known poisons in it. It contains the poisonous gases nitrogen oxide and carbon monoxide. There are over 60 chemicals in tobacco smoke that cause cancer. Smoking is difficult to quit because a chemical in tobacco, called nicotine, causes addiction or dependence. When you smoke and inhale, nicotine is absorbed rapidly into the bloodstream through your lungs. Both inhaled and non-inhaled nicotine may be addictive. What are the risks of cigarette smoke? Cigarette smokers have an increased risk of many serious medical problems, including:  Lung cancer.  Lung disease, such as pneumonia, bronchitis, and emphysema.  Chest pain (angina) and heart attack because the heart is not getting enough oxygen.  Heart disease and peripheral blood vessel disease.  High blood pressure (hypertension).  Stroke.  Oral cancer, including cancer of the lip, mouth, or voice box.  Bladder cancer.  Pancreatic cancer.  Cervical cancer.  Pregnancy complications, including premature birth.  Stillbirths and smaller newborn babies, birth defects, and genetic damage to sperm.  Early menopause.  Lower estrogen level for women.  Infertility.  Facial wrinkles.  Blindness.  Increased risk of broken bones (fractures).  Senile dementia.  Stomach ulcers and internal bleeding.  Delayed wound healing and increased risk of complications during surgery.  Even smoking lightly shortens your life expectancy by several years.  Because of secondhand smoke exposure, children of smokers have an increased risk of the following:  Sudden infant death syndrome (SIDS).  Respiratory infections.  Lung cancer.  Heart disease.  Ear infections.  What are the benefits of quitting? There are many health benefits of quitting smoking. Here are some of them:  Within days of quitting smoking, your risk of having a heart  attack decreases, your blood flow improves, and your lung capacity improves. Blood pressure, pulse rate, and breathing patterns start returning to normal soon after quitting.  Within months, your lungs may clear up completely.  Quitting for 10 years reduces your risk of developing lung cancer and heart disease to almost that of a nonsmoker.  People who quit may see an improvement in their overall quality of life.  How do I quit smoking? Smoking is an addiction with both physical and psychological effects, and longtime habits can be hard to change. Your health care provider can recommend:  Programs and community resources, which may include group support, education, or talk therapy.  Prescription medicines to help reduce cravings.  Nicotine replacement products, such as patches, gum, and nasal sprays. Use these products only as directed. Do not replace cigarette smoking with electronic cigarettes, which are commonly called e-cigarettes. The safety of e-cigarettes is not known, and some may contain harmful chemicals.  A combination of two or more of these methods.  Where to find more information:  American Lung Association: www.lung.org  American Cancer Society: www.cancer.org Summary  Smoking cigarettes is very bad for your health. Cigarette smokers have an increased risk of many serious medical problems, including several cancers, heart disease, and stroke.  Smoking is an addiction with both physical and psychological effects, and longtime habits can be hard to change.  By stopping right away, you can greatly reduce the risk of medical problems for you and your family.  To help you quit smoking, your health care provider can recommend programs, community resources, prescription medicines, and nicotine replacement products such as patches, gum, and nasal sprays. This information is not intended to replace advice given to you by your health  care provider. Make sure you discuss any  questions you have with your health care provider. Document Released: 05/05/2004 Document Revised: 04/01/2016 Document Reviewed: 04/01/2016 Elsevier Interactive Patient Education  2017 [place].  Tobacco Use Disorder Tobacco use disorder (TUD) is a mental disorder. It is the long-term use of tobacco in spite of related health problems or difficulty with normal life activities. Tobacco is most commonly smoked as cigarettes and less commonly as cigars or pipes. Smokeless chewing tobacco and snuff are also popular. People with TUD get a feeling of extreme pleasure (euphoria) from using tobacco and have a desire to use it again and again. Repeated use of tobacco can cause problems. The addictive effects of tobacco are due mainly tothe ingredient nicotine. Nicotine also causes a rush of adrenaline (epinephrine) in the body. This leads to increased blood pressure, heart rate, and breathing rate. These changes may cause problems for people with high blood pressure, weak hearts, or lung disease. High doses of nicotine in children and pets can lead to seizures and death. Tobacco contains a number of other unsafe chemicals. These chemicals are especially harmful when inhaled as smoke and can damage almost every organ in the body. Smokers live shorter lives than nonsmokers and are at risk of dying from a number of diseases and cancers. Tobacco smoke can also cause health problems for nonsmokers (due to inhaling secondhand smoke). Smoking is also a fire hazard. TUD usually starts in the late teenage years and is most common in young adults between the ages of 22 and 7 years. People who start smoking earlier in life are more likely to continue smoking as adults. TUD is somewhat more common in men than women. People with TUD are at higher risk for using alcohol and other drugs of abuse. What increases the risk? Risk factors for TUD include:  Having family members with the disorder.  Being around people who  use tobacco.  Having an existing mental health issue such as schizophrenia, depression, bipolar disorder, ADHD, or posttraumatic stress disorder (PTSD).  What are the signs or symptoms? People with tobacco use disorder have two or more of the following signs and symptoms within 12 months:  Use of more tobacco over a longer period than intended.  Not able to cut down or control tobacco use.  A lot of time spent obtaining or using tobacco.  Strong desire or urge to use tobacco (craving). Cravings may last for 6 months or longer after quitting.  Use of tobacco even when use leads to major problems at work, school, or home.  Use of tobacco even when use leads to relationship problems.  Giving up or cutting down on important life activities because of tobacco use.  Repeatedly using tobacco in situations where it puts you or others in physical danger, like smoking in bed.  Use of tobacco even when it is known that a physical or mental problem is likely related to tobacco use. ? Physical problems are numerous and may include chronic bronchitis, emphysema, lung and other cancers, gum disease, high blood pressure, heart disease, and stroke. ? Mental problems caused by tobacco may include difficulty sleeping and anxiety.  Need to use greater amounts of tobacco to get the same effect. This means you have developed a tolerance.  Withdrawal symptoms as a result of stopping or rapidly cutting back use. These symptoms may last a month or more after quitting and include the following: ? Depressed, anxious, or irritable mood. ? Difficulty concentrating. ? Increased appetite. ?  Restlessness or trouble sleeping. ? Use of tobacco to avoid withdrawal symptoms.  How is this diagnosed? Tobacco use disorder is diagnosed by your health care provider. A diagnosis may be made by:  Your health care provider asking questions about your tobacco use and any problems it may be causing.  A physical  exam.  Lab tests.  You may be referred to a mental health professional or addiction specialist.  The severity of tobacco use disorder depends on the number of signs and symptoms you have:  Mild-Two or three symptoms.  Moderate-Four or five symptoms.  Severe-Six or more symptoms.  How is this treated? Many people with tobacco use disorder are unable to quit on their own and need help. Treatment options include the following:  Nicotine replacement therapy (NRT). NRT provides nicotine without the other harmful chemicals in tobacco. NRT gradually lowers the dosage of nicotine in the body and reduces withdrawal symptoms. NRT is available in over-the-counter forms (gum, lozenges, and skin patches) as well as prescription forms (mouth inhaler and nasal spray).  Medicines.This may include: ? Antidepressant medicine that may reduce nicotine cravings. ? A medicine that acts on nicotine receptors in the brain to reduce cravings and withdrawal symptoms. It may also block the effects of tobacco in people with TUD who relapse.  Counseling or talk therapy. A form of talk therapy called behavioral therapy is commonly used to treat people with TUD. Behavioral therapy looks at triggers for tobacco use, how to avoid them, and how to cope with cravings. It is most effective in person or by phone but is also available in self-help forms (books and Internet websites).  Support groups. These provide emotional support, advice, and guidance for quitting tobacco.  The most effective treatment for TUD is usually a combination of medicine, talk therapy, and support groups. Follow these instructions at home:  Keep all follow-up visits as directed by your health care provider. This is important.  Take medicines only as directed by your health care provider.  Check with your health care provider before starting new prescription or over-the-counter medicines. Contact a health care provider if:  You are not  able to take your medicines as prescribed.  Treatment is not helping your TUD and your symptoms get worse. Get help right away if:  You have serious thoughts about hurting yourself or others.  You have trouble breathing, chest pain, sudden weakness, or sudden numbness in part of your body. This information is not intended to replace advice given to you by your health care provider. Make sure you discuss any questions you have with your health care provider. Document Released: 12/02/2003 Document Revised: 11/29/2015 Document Reviewed: 05/24/2013 Elsevier Interactive Patient Education  2018 [place] with Quitting Smoking Quitting smoking is a physical and mental challenge. You will face cravings, withdrawal symptoms, and temptation. Before quitting, work with your health care provider to make a plan that can help you cope. Preparation can help you quit and keep you from giving in. How can I cope with cravings? Cravings usually last for 5-10 minutes. If you get through it, the craving will pass. Consider taking the following actions to help you cope with cravings:  Keep your mouth busy: ? Chew sugar-free gum. ? Suck on hard candies or a straw. ? Brush your teeth.  Keep your hands and body busy: ? Immediately change to a different activity when you feel a craving. ? Squeeze or play with a ball. ? Do an activity or a  hobby, like making bead jewelry, practicing needlepoint, or working with wood. ? Mix up your normal routine. ? Take a short exercise break. Go for a quick walk or run up and down stairs. ? Spend time in public places where smoking is not allowed.  Focus on doing something kind or helpful for someone else.  Call a friend or family member to talk during a craving.  Join a support group.  Call a quit line, such as 1-800-QUIT-NOW.  Talk with your health care provider about medicines that might help you cope with cravings and make quitting easier for you.  How  can I deal with withdrawal symptoms? Your body may experience negative effects as it tries to get used to not having nicotine in the system. These effects are called withdrawal symptoms. They may include:  Feeling hungrier than normal.  Trouble concentrating.  Irritability.  Trouble sleeping.  Feeling depressed.  Restlessness and agitation.  Craving a cigarette.  To manage withdrawal symptoms:  Avoid places, people, and activities that trigger your cravings.  Remember why you want to quit.  Get plenty of sleep.  Avoid coffee and other caffeinated drinks. These may worsen some of your symptoms.  How can I handle social situations? Social situations can be difficult when you are quitting smoking, especially in the first few weeks. To manage this, you can:  Avoid parties, bars, and other social situations where people might be smoking.  Avoid alcohol.  Leave right away if you have the urge to smoke.  Explain to your family and friends that you are quitting smoking. Ask for understanding and support.  Plan activities with friends or family where smoking is not an option.  What are some ways I can cope with stress? Wanting to smoke may cause stress, and stress can make you want to smoke. Find ways to manage your stress. Relaxation techniques can help. For example:  Breathe slowly and deeply, in through your nose and out through your mouth.  Listen to soothing, relaxing music.  Talk with a family member or friend about your stress.  Light a candle.  Soak in a bath or take a shower.  Think about a peaceful place.  What are some ways I can prevent weight gain? Be aware that many people gain weight after they quit smoking. However, not everyone does. To keep from gaining weight, have a plan in place before you quit and stick to the plan after you quit. Your plan should include:  Having healthy snacks. When you have a craving, it may help to: ? Eat plain popcorn,  crunchy carrots, celery, or other cut vegetables. ? Chew sugar-free gum.  Changing how you eat: ? Eat small portion sizes at meals. ? Eat 4-6 small meals throughout the day instead of 1-2 large meals a day. ? Be mindful when you eat. Do not watch television or do other things that might distract you as you eat.  Exercising regularly: ? Make time to exercise each day. If you do not have time for a long workout, do short bouts of exercise for 5-10 minutes several times a day. ? Do some form of strengthening exercise, like weight lifting, and some form of aerobic exercise, like running or swimming.  Drinking plenty of water or other low-calorie or no-calorie drinks. Drink 6-8 glasses of water daily, or as much as instructed by your health care provider.  Summary  Quitting smoking is a physical and mental challenge. You will face cravings, withdrawal symptoms, and  temptation to smoke again. Preparation can help you as you go through these challenges.  You can cope with cravings by keeping your mouth busy (such as by chewing gum), keeping your body and hands busy, and making calls to family, friends, or a helpline for people who want to quit smoking.  You can cope with withdrawal symptoms by avoiding places where people smoke, avoiding drinks with caffeine, and getting plenty of rest.  Ask your health care provider about the different ways to prevent weight gain, avoid stress, and handle social situations. This information is not intended to replace advice given to you by your health care provider. Make sure you discuss any questions you have with your health care provider. Document Released: 03/25/2016 Document Revised: 03/25/2016 Document Reviewed: 03/25/2016 Elsevier Interactive Patient Education  [employer].

## 2017-11-01 ENCOUNTER — Encounter: Payer: Self-pay | Admitting: Nurse Practitioner

## 2017-11-01 LAB — CMP14+EGFR
A/G RATIO: 1.8 (ref 1.2–2.2)
ALBUMIN: 4.3 g/dL (ref 3.5–5.5)
ALK PHOS: 76 IU/L (ref 39–117)
ALT: 12 IU/L (ref 0–32)
AST: 19 IU/L (ref 0–40)
BUN / CREAT RATIO: 9 (ref 9–23)
BUN: 9 mg/dL (ref 6–24)
CHLORIDE: 105 mmol/L (ref 96–106)
CO2: 24 mmol/L (ref 20–29)
Calcium: 9.7 mg/dL (ref 8.7–10.2)
Creatinine, Ser: 0.98 mg/dL (ref 0.57–1.00)
GFR calc Af Amer: 77 mL/min/{1.73_m2} (ref >59)
GFR calc non Af Amer: 67 mL/min/{1.73_m2} (ref >59)
GLOBULIN, TOTAL: 2.4 g/dL (ref 1.5–4.5)
Glucose: 95 mg/dL (ref 65–99)
POTASSIUM: 4.1 mmol/L (ref 3.5–5.2)
SODIUM: 143 mmol/L (ref 134–144)
Total Protein: 6.7 g/dL (ref 6.0–8.5)

## 2017-11-01 LAB — CBC
Hematocrit: 38.1 % (ref 34.0–46.6)
Hemoglobin: 11.8 g/dL (ref 11.1–15.9)
MCH: 27 pg (ref 26.6–33.0)
MCHC: 31 g/dL — ABNORMAL LOW (ref 31.5–35.7)
MCV: 87 fL (ref 79–97)
PLATELETS: 187 10*3/uL (ref 150–450)
RBC: 4.37 x10E6/uL (ref 3.77–5.28)
RDW: 15.1 % (ref 12.3–15.4)
WBC: 6.6 10*3/uL (ref 3.4–10.8)

## 2017-11-01 LAB — URINE CYTOLOGY ANCILLARY ONLY
CHLAMYDIA, DNA PROBE: NEGATIVE
NEISSERIA GONORRHEA: NEGATIVE
Trichomonas: NEGATIVE

## 2017-11-01 LAB — VITAMIN D 25 HYDROXY (VIT D DEFICIENCY, FRACTURES): Vit D, 25-Hydroxy: 23.9 ng/mL — ABNORMAL LOW (ref 30.0–100.0)

## 2017-11-02 ENCOUNTER — Other Ambulatory Visit: Payer: Self-pay | Admitting: Nurse Practitioner

## 2017-11-02 LAB — URINE CYTOLOGY ANCILLARY ONLY
Bacterial vaginitis: POSITIVE — AB
Candida vaginitis: NEGATIVE

## 2017-11-02 MED ORDER — METRONIDAZOLE 500 MG PO TABS: 500.0000 mg | ORAL_TABLET | Freq: Two times a day (BID) | ORAL | 0 refills | Status: AC

## 2017-11-08 ENCOUNTER — Ambulatory Visit: Payer: Self-pay

## 2017-11-15 ENCOUNTER — Ambulatory Visit: Payer: Self-pay | Attending: Family Medicine

## 2017-11-15 MED FILL — ?METRONIDAZOLE 500MG TABS: 500 | 7 days supply | Qty: 14 | Fill #0

## 2017-12-08 ENCOUNTER — Ambulatory Visit: Payer: Self-pay | Attending: Nurse Practitioner

## 2017-12-10 ENCOUNTER — Other Ambulatory Visit: Payer: Self-pay

## 2017-12-10 ENCOUNTER — Emergency Department (HOSPITAL_COMMUNITY)
Admission: EM | Admit: 2017-12-10 | Discharge: 2017-12-10 | Disposition: A | Discharge status: Home / Self Care | Payer: Medicaid Other | Attending: Emergency Medicine | Admitting: Emergency Medicine

## 2017-12-10 ENCOUNTER — Encounter (HOSPITAL_COMMUNITY): Payer: Self-pay | Admitting: Emergency Medicine

## 2017-12-10 ENCOUNTER — Emergency Department (HOSPITAL_COMMUNITY): Payer: Medicaid Other

## 2017-12-10 DIAGNOSIS — M542 Cervicalgia: Secondary | ICD-10-CM | POA: Diagnosis not present

## 2017-12-10 DIAGNOSIS — Z7984 Long term (current) use of oral hypoglycemic drugs: Secondary | ICD-10-CM | POA: Diagnosis not present

## 2017-12-10 DIAGNOSIS — Y939 Activity, unspecified: Secondary | ICD-10-CM | POA: Diagnosis not present

## 2017-12-10 DIAGNOSIS — W228XXA Striking against or struck by other objects, initial encounter: Secondary | ICD-10-CM | POA: Diagnosis not present

## 2017-12-10 DIAGNOSIS — Z79899 Other long term (current) drug therapy: Secondary | ICD-10-CM | POA: Insufficient documentation

## 2017-12-10 DIAGNOSIS — R079 Chest pain, unspecified: Secondary | ICD-10-CM | POA: Insufficient documentation

## 2017-12-10 DIAGNOSIS — Y999 Unspecified external cause status: Secondary | ICD-10-CM | POA: Diagnosis not present

## 2017-12-10 DIAGNOSIS — Y92009 Unspecified place in unspecified non-institutional (private) residence as the place of occurrence of the external cause: Secondary | ICD-10-CM | POA: Diagnosis not present

## 2017-12-10 DIAGNOSIS — E119 Type 2 diabetes mellitus without complications: Secondary | ICD-10-CM | POA: Insufficient documentation

## 2017-12-10 DIAGNOSIS — I10 Essential (primary) hypertension: Secondary | ICD-10-CM | POA: Insufficient documentation

## 2017-12-10 DIAGNOSIS — F17210 Nicotine dependence, cigarettes, uncomplicated: Secondary | ICD-10-CM | POA: Diagnosis not present

## 2017-12-10 DIAGNOSIS — M7918 Myalgia, other site: Secondary | ICD-10-CM

## 2017-12-10 MED ORDER — IBUPROFEN 400 MG PO TABS
400.0000 mg | ORAL_TABLET | Freq: Once | ORAL | Status: AC
  Administered 2017-12-10: 400 mg via ORAL
  Filled 2017-12-10: qty 1

## 2017-12-10 NOTE — Discharge Instructions (Signed)
Please return to the Emergency Department for any new or worsening symptoms or if your symptoms do not improve. Please be sure to follow up with your Primary Care Physician as soon as possible regarding your visit today. If you do not have a Primary Doctor please use the resources below to establish one.     RESOURCE GUIDE  Chronic Pain Problems: Contact [facility]  [phone] Patients need to be referred by their primary care doctor.  Insufficient Money for Medicine: Contact United Way:  call "211" or [place] [phone].  No Primary Care Doctor: Call [facility]  [phone] - can help you locate a primary care doctor that  accepts your insurance, provides certain services, etc. Physician Referral Service- [phone]  Agencies that provide inexpensive medical care: [facility]  [facility]  [phone] [facility]  [phone] [facility]  [phone] [facility]  [phone] [facility]  [phone]  [place] Providers: [facility]- [address], [address]  [phone], Mon-Fri 9am-7pm, Sat 9am-1pm [place], [address], [address] [place]  [place]- [address]  [place], [address], [phone]  Only accepts [place] [facility] [place] patients after they have their name  applied to their card  Self Pay (no insurance) in [facility]: Sickle Cell Patients: Dr Kevan Ny, [facility]  [place], [facility]- [place]  [place] [place]- [address], [facility]- see information above (Speak to [employer] if you do not have insurance)       -   [facility]- [place], [place],  [place], [place]  Dr Vista Lawman-  [address], [place], [facility]- [address] [phone]       -  [facility]- [address], [place], also [address], [phone]       -    [facility]- [address], [place], 1st & 3rd Saturday   every month, 10am-1pm  1) Find a Doctor and Pay Out of Pocket Although you won't have to find out who is covered by your insurance plan, it is a good idea to ask around and get recommendations. You will then need to call the office and see if the doctor you have chosen will accept you as a new patient and what types of options they offer for patients who are self-pay. Some doctors offer discounts or will set up payment plans for their patients who do not have insurance, but you will need to ask so you aren't surprised when you get to your appointment.  2) Contact Your Local Health Department Not all health departments have doctors that can see patients for sick visits, but many do, so it is worth a  call to see if yours does. If you don't know where your local health department is, you can check in your phone book. The CDC also has a tool to help you locate your state's health department, and many state websites also have listings of all of their local health departments.  3) Find a [facility] If your illness is not likely to be very severe or complicated, you may want to try a walk in clinic. These are popping up all over the country in pharmacies, drugstores, and shopping centers. They're usually staffed by nurse practitioners or physician assistants that have been trained to treat common illnesses and complaints. They're usually fairly quick and inexpensive. However, if you have serious medical issues or  chronic medical problems, these are probably not your best option  [facility] [place], [facility], [address] [place], phone [phone] or [phone].  Monday - Friday, call for an appointment. [place], [facility], [address], [place], phone [phone] or [phone].  Monday - Friday, call for an appointment.  Abuse/Neglect: [place] [phone] [place] [phone] (After Hours)  Emergency Shelter:  [facility] [phone]  Maternity Homes: Room at the [place] [phone] [place] [phone]  MRSA Hotline #:   (331)675-4392  [facility] Dept. [address]         [place] Phone:  [phone]                                  Phone:  [phone]                   Phone:  [phone]  [facility], [place]- [phone]       -     [facility] in [place], [address],                                  [place] [phone] or [phone] (After Hours)   [place]  Substance Abuse Resources: [facility]  [phone] [place] [phone] [facility] [place] [facility] [phone] Residential & Outpatient Substance Abuse Program  [phone]  Psychological Services: [place]  [phone] [place]  [place], [place]. [address], [place], [place]: [phone] or [phone], [employer]  Dental Assistance  If unable to pay or uninsured, contact:  [facility] or [facility]. to become  qualified for the adult dental clinic.  Patients with Medicaid: [facility] [phone] [address], [place] [address] [phone]  If unable to pay, or uninsured, contact [facility] [phone]) or [place] [phone] in [place], [place] in [facility]) to become qualified for the adult dental clinic   Other [place]- [place], [place], [place], [address], [place], [place], 2nd and 4th Thursday of the month at 6:30am.  10 clients each day by appointment, can sometimes see walk-in patients if someone does not show for an appointment. [facility]- [address] [place], [place], [address], [place], [place], [place], [address], [place] Department- [phone] [place] [facility] [phone]

## 2017-12-10 NOTE — ED Provider Notes (Signed)
[facility] EMERGENCY DEPARTMENT Provider Note   CSN: [id number] Arrival date & time: 12/10/17  1119     History   Chief Complaint Chief Complaint  Patient presents with  . Pain    HPI Kendra Griffith is a 52 y.o. female presenting primarily for bilateral neck pain that began approximately 1 AM last night.  Patient states that she was attempting to pull a box off of a high shelf in her closet when the box slipped out of her hands striking her in the top part of her chest.  Patient denies fall, head injury or loss of consciousness.  Patient states that after the box struck her chest she began having bilateral posterior neck pain that she describes as a throbbing 5/10 in severity that is worse with turning her head to the side.  Patient states that she has not taken anything for her pain because she "did not want the pain to go away before she came to the emergency department ."  Additionally patient is concerned that the box may of hurt her chest when it fell.  Patient states that when she pushes on the muscles of her chest she has pain that she describes as a throbbing and mild in severity that is only present when she pushes on her chest.  Of note patient states that she has chronic pain from her neuropathy and is seen regular by her primary care provider for multiple different complaints, she states that aside from her neck her pain is not greater in any other area than it normally is.  Denies change in nature to any of her pains.  HPI  Past Medical History:  Diagnosis Date  . Anemia   . Anxiety   . Blood transfusion without reported diagnosis    childbirth  . Clotting disorder ([facility])    childbirth  . Depression   . Diabetes mellitus without complication (HCC)    prediabetes  . Hypertension   . Injury    right side from jumping off bunkbed  . Pre-diabetes     Patient Active Problem List   Diagnosis Date Noted  . Alcoholic peripheral neuropathy ([place])  06/09/2017  . Diabetic polyneuropathy associated with diabetes mellitus due to underlying condition ([place]) 06/09/2017  . Tobacco use 06/09/2017  . Foot pain, bilateral 01/18/2017  . Moderate episode of recurrent major depressive disorder ([place]) 05/04/2016  . Breast cancer screening 01/21/2016  . Smoking 09/25/2014  . Chronic pain syndrome 09/25/2014  . Callus of foot 08/19/2014  . Type 2 diabetes mellitus with diabetic polyneuropathy, without long-term current use of insulin ([place]) 06/30/2014  . Depression 06/30/2014  . Peripheral neuropathy 05/09/2013  . Poor dentition 02/01/2013  . Pain in joint, ankle and foot 02/01/2013  . Essential hypertension, benign 02/01/2013  . FOOT PAIN, BILATERAL 06/16/2010  . RECTAL BLEEDING 12/08/2009  . HEMATURIA UNSPECIFIED 12/08/2009  . DEPRESSION 08/20/2009  . ABSCESS, TOOTH 08/20/2009  . HYPERLIPIDEMIA 07/30/2009  . SICKLE-CELL TRAIT 07/30/2009  . ANEMIA-NOS 07/30/2009  . ALCOHOLISM 07/30/2009  . HYPERTENSION 07/30/2009  . GERD 07/30/2009  . CALLUS, FOOT 07/30/2009    Past Surgical History:  Procedure Laterality Date  . CESAREAN SECTION     three  . cyst removal 1997    . FACIAL RECONSTRUCTION SURGERY    . FOOT SURGERY Bilateral      OB History    Gravida  5   Para      Term      Preterm  AB      Living  3     SAB      TAB      Ectopic      Multiple      Live Births               Home Medications    Prior to Admission medications   Medication Sig Start Date End Date Taking? Authorizing Provider  acetaminophen-codeine (TYLENOL #3) 300-30 MG tablet Take 1 tablet by mouth every 4 (four) hours as needed. 08/01/17   [name], NP  amitriptyline (ELAVIL) 50 MG tablet Take 1 tablet (50 mg total) by mouth at bedtime. 10/05/16   Tresa Garter, MD  Biotin (BIOTIN MAXIMUM STRENGTH) 10 MG TABS Take 5,000 mg by mouth daily.     [provider]  famotidine (PEPCID) 20 MG tablet Take 1 tablet (20 mg  total) by mouth daily. 10/05/16   Tresa Garter, MD  gabapentin (NEURONTIN) 400 MG capsule TAKE 2 CAPSULES BY MOUTH 3 TIMES DAILY. 10/31/17   [name], NP  hydrochlorothiazide (HYDRODIURIL) 25 MG tablet Take 1 tablet (25 mg total) by mouth daily. 10/31/17   [name], NP  hydrOXYzine (ATARAX/VISTARIL) 25 MG tablet Take 1 tablet (25 mg total) by mouth 3 (three) times daily as needed. 01/18/17   Tresa Garter, MD  ibuprofen (ADVIL,MOTRIN) 800 MG tablet Take 1 tablet (800 mg total) by mouth every 8 (eight) hours as needed. 08/01/17   [name], NP  metFORMIN (GLUCOPHAGE-XR) 500 MG 24 hr tablet Take 1 tablet (500 mg total) by mouth daily with breakfast. 10/31/17   [name], NP    Family History Family History  Problem Relation Age of Onset  . Heart disease Mother   . Asthma Sister   . Diabetes Sister   . COPD Sister   . Diabetes Sister   . Diabetes Sister   . Breast cancer Neg Hx   . Colon cancer Neg Hx     Social History Social History   Tobacco Use  . Smoking status: Current Every Day Smoker    Packs/day: 2.00    Years: 29.00    Pack years: 58.00    Types: Cigarettes  . Smokeless tobacco: Never Used  Substance Use Topics  . Alcohol use: No    Alcohol/week: 0.0 standard drinks    Frequency: Never    Comment: Stopped drinking 2013  . Drug use: No     Allergies   Amoxicillin; Darvocet [propoxyphene n-acetaminophen]; Hydrocodone-acetaminophen; Percocet [oxycodone-acetaminophen]; Valium [diazepam]; Hydrocodone-acetaminophen; and Propoxyphene   Review of Systems Review of Systems  Constitutional: Negative.  Negative for chills, fatigue and fever.  Respiratory: Negative.  Negative for cough, choking and shortness of breath.   Gastrointestinal: Negative.  Negative for abdominal pain, diarrhea, nausea and vomiting.  Musculoskeletal: Positive for arthralgias, myalgias and neck pain. Negative for back pain and gait problem.  Skin: Negative.   Negative for wound.  Neurological: Negative.  Negative for dizziness, syncope, weakness, light-headedness and headaches.    Physical Exam Updated Vital Signs BP (!) 133/94 (BP Location: Right Arm)   Pulse 67   Temp 97.7 F (36.5 C)   Resp 20   Ht 5\' 5"  (1.651 m)   Wt 74.8 kg   SpO2 100%   BMI 27.46 kg/m   Physical Exam  Constitutional: She is oriented to person, place, and time. She appears well-developed and well-nourished. No distress.  HENT:  Head: Normocephalic  and atraumatic.  Right Ear: External ear normal.  Left Ear: External ear normal.  Nose: Nose normal.  Mouth/Throat: Uvula is midline, oropharynx is clear and moist and mucous membranes are normal.  Eyes: Pupils are equal, round, and reactive to light. Conjunctivae and EOM are normal.  Neck: Trachea normal, normal range of motion and phonation normal. Neck supple. Muscular tenderness present. No tracheal tenderness and no spinous process tenderness present. No neck rigidity. No tracheal deviation, no edema, no erythema and normal range of motion present.    Patient with bilateral muscular tenderness to her neck.  No midline cervical spine tenderness to palpation, no crepitus step-off or deformity noted.  Cardiovascular: Normal rate, regular rhythm, normal heart sounds and intact distal pulses.  Pulmonary/Chest: Effort normal and breath sounds normal. No accessory muscle usage. No respiratory distress. She exhibits tenderness. She exhibits no laceration, no crepitus, no edema, no deformity and no swelling.  Patient with tenderness to palpation of her chest, no deformity bruising, laceration or color change noted.  Normal-appearing chest.  No signs of injury or infection.  Abdominal: Soft. Normal appearance. There is no tenderness. There is no rigidity, no rebound and no guarding.  Musculoskeletal: Normal range of motion.       Right shoulder: Normal.       Left shoulder: Normal.       Cervical back: She exhibits  tenderness. She exhibits normal range of motion, no bony tenderness, no swelling, no edema and no deformity.       Thoracic back: Normal.       Lumbar back: Normal.  Neurological: She is alert and oriented to person, place, and time. She has normal strength. No cranial nerve deficit or sensory deficit. GCS eye subscore is 4. GCS verbal subscore is 5. GCS motor subscore is 6.  Mental Status: Alert, oriented, thought content appropriate, able to give a coherent history. Speech fluent without evidence of aphasia. Able to follow 2 step commands without difficulty. Cranial Nerves: II-XII: Grossly intact bilaterally Motor: Normal tone. 5/5 strength in upper and lower extremities bilaterally including strong and equal grip strength and dorsiflexion/plantar flexion Sensory: Sensation intact to light touch in all extremities. Cerebellar: Normal heel-to -shin balance bilaterally of the lower extremity. Gait: normal gait and balance CV: distal pulses palpable throughout  Skin: Skin is warm and dry.  Psychiatric: She has a normal mood and affect. Her behavior is normal.     ED Treatments / Results  Labs (all labs ordered are listed, but only abnormal results are displayed) Labs Reviewed - No data to display  EKG EKG Interpretation  Date/Time:  Sunday December 10 2017 13:32:39 EDT Ventricular Rate:  72 PR Interval:  156 QRS Duration: 90 QT Interval:  448 QTC Calculation: 490 R Axis:   9 Text Interpretation:  Normal sinus rhythm Anterior infarct , age undetermined Abnormal ECG No significant change since last tracing Confirmed by Deno Etienne [phone]) on 12/10/2017 3:13:00 PM   Radiology Dg Chest 2 View  Result Date: 12/10/2017 CLINICAL DATA:  Posttraumatic chest pain. EXAM: CHEST - 2 VIEW COMPARISON:  11/17/2009 FINDINGS: Normal heart size and mediastinal contours. No acute infiltrate or edema. No effusion or pneumothorax. No acute osseous findings. IMPRESSION: Negative chest.  Electronically Signed   By: Monte Fantasia M.D.   On: 12/10/2017 14:22   Dg Cervical Spine Complete  Result Date: 12/10/2017 CLINICAL DATA:  Head pain radiating into the right shoulder and right side of the neck since a blow to the  head when a box fell out of a closet and struck the patient's head. Initial encounter. EXAM: CERVICAL SPINE - COMPLETE 4+ VIEW COMPARISON:  Cervical spine MRI 06/25/2017. FINDINGS: There is no evidence of cervical spine fracture or prevertebral soft tissue swelling. Alignment is normal. No other significant bone abnormalities are identified. IMPRESSION: Negative cervical spine radiographs. Electronically Signed   By: Inge Rise M.D.   On: 12/10/2017 14:24    Procedures Procedures (including critical care time)  Medications Ordered in ED Medications  ibuprofen (ADVIL,MOTRIN) tablet 400 mg (400 mg Oral Given 12/10/17 1529)     Initial Impression / Assessment and Plan / ED Course  I have reviewed the triage vital signs and the nursing notes.  Pertinent labs & imaging results that were available during my care of the patient were reviewed by me and considered in my medical decision making (see chart for details).  Clinical Course as of Dec 11 1622  Nancy Fetter Dec 10, 2017  1605 On reassessment after ibuprofen administration patient is sitting comfortably in room no acute distress.  Patient states that the pain medicine has helped improve her pain. When asked if there is anything else today that I can help the patient with she responded with "well you do not seem like you have enough time so I am not going to bother you with it.  "I encouraged the patient to share her concerns today and asked if she was having any other symptoms or pain that I could help her with, she again refused to share with me her information.  I informed the patient that she may change her mind and speak with me at any time regarding any concern and return to the emergency department at any time for  future concerns.   [BM]    Clinical Course User Index [BM] Deliah Boston, PA-C   Patient presenting with neck pain after she dropped a box on herself approximately 11 hours prior to arrival.  Neck pain appears musculoskeletal in nature she is tender to the musculature of the neck, cervical x-rays negative.  Patient denies numbness tingling or weakness to her extremities, no saddle area paresthesias or urinary or bladder incontinence.  Patient with full range of motion of the neck.  No neuro deficits on exam.  Pain relieved with 1 dose of ibuprofen.  Patient also with tenderness to palpation of the musculature of the chest.  Pain only with palpation of the chest; no pain at rest.  Screening chest x-ray was negative, EKG read by Dr. Tyrone Nine negative for acute changes.  Doubt cardiac or pulmonary concerns at this time due to presentation, vital signs stable, no tracheal deviation, no JVD, no new murmur, regular rate and rhythm, breath sounds equal bilaterally, negative chest x-ray, EKG without acute abnormalities, patient only has pain with palpation of the musculature of the chest.  Patient's case was discussed with Dr. Tyrone Nine who agrees with discharge and outpatient follow-up for this patient.  Patient is afebrile, not tachycardic, sitting comfortably in room no acute distress.  On reevaluation patient states relief of symptoms with ibuprofen.  I discussed over-the-counter anti-inflammatory medications with the patient.  During reevaluation patient seemed agitated and would not share additional concerns as explained above.  Patient encouraged to share concerns multiple times but would not.  Patient is alert and oriented x4.  I encouraged the patient to share her concerns with either myself or nursing staff prior to leaving.  I have spoken with nursing staff about the  patient, informed shortly after that patient was not in her room and has left the department prior to receiving her discharge  paperwork.  At this time there does not appear to be any evidence of an acute emergency medical condition and the patient appears stable for discharge with appropriate outpatient follow up. Diagnosis was discussed with patient who verbalizes understanding of care plan and is agreeable to discharge. I have discussed return precautions with patient who verbalizes understanding of return precautions. Patient strongly encouraged to follow-up with their PCP. All questions answered.  Patient's case discussed with Dr. Tyrone Nine who agrees with plan to discharge with follow-up.     Note: Portions of this report may have been transcribed using voice recognition software. Every effort was made to ensure accuracy; however, inadvertent computerized transcription errors may still be present.     Final Clinical Impressions(s) / ED Diagnoses   Final diagnoses:  Musculoskeletal pain  Neck pain    ED Discharge Orders    None       Deliah Boston, PA-C 12/10/17 [place], [place], DO 12/11/17 [phone]

## 2017-12-10 NOTE — ED Notes (Signed)
Upon entering room to DC pt, pt noted to have left. EDP is aware.

## 2017-12-10 NOTE — ED Triage Notes (Signed)
Pt reports gen pain to entire body X12 hrs. Pt states she was reaching for a box on a shelf when the box fell hitting her chest area. Denies hitting her head. No LOC. Pt has chronic pain issues already.

## 2017-12-18 ENCOUNTER — Ambulatory Visit: Payer: No Typology Code available for payment source | Admitting: Neurology

## 2018-01-02 ENCOUNTER — Telehealth: Payer: Self-pay | Admitting: Nurse Practitioner

## 2018-01-02 NOTE — Telephone Encounter (Signed)
Patient has pain in teeth, states she has severe tooth pain and would like a prescription for amoxicillin and a dental referral.

## 2018-01-02 NOTE — Telephone Encounter (Signed)
Will route to PCP 

## 2018-01-03 ENCOUNTER — Other Ambulatory Visit: Payer: Self-pay | Admitting: Nurse Practitioner

## 2018-01-03 MED ORDER — CHLORHEXIDINE GLUCONATE 0.12 % MT SOLN: 15.0000 mL | Freq: Two times a day (BID) | OROMUCOSAL | 0 refills | Status: DC

## 2018-01-03 MED FILL — CHLORHEXIDINE 0.12% RINSE: 0.12 | 15 days supply | Qty: 473 | Fill #0

## 2018-01-07 ENCOUNTER — Other Ambulatory Visit: Payer: Self-pay | Admitting: Nurse Practitioner

## 2018-01-07 DIAGNOSIS — K069 Disorder of gingiva and edentulous alveolar ridge, unspecified: Secondary | ICD-10-CM

## 2018-01-07 NOTE — Telephone Encounter (Signed)
An oral medication to help with gum infection has been sent. Referral has been made however please explain our dental policy and the orange card/CAFA

## 2018-01-08 NOTE — Telephone Encounter (Signed)
Pt. Was inform that PCP sent a medication for her gum infection.  Patient verified DOB.  Patient was also inform that dental referral has been placed and will be contact by them to make an appt.

## 2018-01-22 ENCOUNTER — Ambulatory Visit: Payer: No Typology Code available for payment source | Admitting: Podiatry

## 2018-01-31 ENCOUNTER — Ambulatory Visit: Payer: Medicaid Other | Attending: Nurse Practitioner | Admitting: Nurse Practitioner

## 2018-01-31 ENCOUNTER — Other Ambulatory Visit: Payer: Self-pay | Admitting: Internal Medicine

## 2018-01-31 ENCOUNTER — Encounter: Payer: Self-pay | Admitting: Nurse Practitioner

## 2018-01-31 VITALS — BP 115/84 | HR 92 | Temp 98.6°F | Ht 65.0 in | Wt 163.0 lb

## 2018-01-31 DIAGNOSIS — G894 Chronic pain syndrome: Secondary | ICD-10-CM | POA: Diagnosis not present

## 2018-01-31 DIAGNOSIS — Z880 Allergy status to penicillin: Secondary | ICD-10-CM | POA: Insufficient documentation

## 2018-01-31 DIAGNOSIS — R7303 Prediabetes: Secondary | ICD-10-CM | POA: Diagnosis not present

## 2018-01-31 DIAGNOSIS — Z7984 Long term (current) use of oral hypoglycemic drugs: Secondary | ICD-10-CM | POA: Insufficient documentation

## 2018-01-31 DIAGNOSIS — Z79899 Other long term (current) drug therapy: Secondary | ICD-10-CM | POA: Diagnosis not present

## 2018-01-31 DIAGNOSIS — E1142 Type 2 diabetes mellitus with diabetic polyneuropathy: Secondary | ICD-10-CM | POA: Insufficient documentation

## 2018-01-31 DIAGNOSIS — F419 Anxiety disorder, unspecified: Secondary | ICD-10-CM | POA: Diagnosis not present

## 2018-01-31 DIAGNOSIS — R413 Other amnesia: Secondary | ICD-10-CM | POA: Diagnosis not present

## 2018-01-31 DIAGNOSIS — Z885 Allergy status to narcotic agent status: Secondary | ICD-10-CM | POA: Insufficient documentation

## 2018-01-31 DIAGNOSIS — I10 Essential (primary) hypertension: Secondary | ICD-10-CM | POA: Diagnosis not present

## 2018-01-31 DIAGNOSIS — Z0000 Encounter for general adult medical examination without abnormal findings: Secondary | ICD-10-CM | POA: Insufficient documentation

## 2018-01-31 DIAGNOSIS — F329 Major depressive disorder, single episode, unspecified: Secondary | ICD-10-CM | POA: Insufficient documentation

## 2018-01-31 LAB — POCT GLYCOSYLATED HEMOGLOBIN (HGB A1C): HEMOGLOBIN A1C: 6.5 % — AB (ref 4.0–5.6)

## 2018-01-31 LAB — GLUCOSE, POCT (MANUAL RESULT ENTRY): POC Glucose: 86 mg/dl (ref 70–99)

## 2018-01-31 MED ORDER — METFORMIN HCL ER 500 MG PO TB24: 500.0000 mg | ORAL_TABLET | Freq: Two times a day (BID) | ORAL | 3 refills | Status: DC

## 2018-01-31 MED ORDER — HYDROCHLOROTHIAZIDE 25 MG PO TABS: 25.0000 mg | ORAL_TABLET | Freq: Every day | ORAL | 3 refills | Status: DC

## 2018-01-31 MED ORDER — HYDROXYZINE PAMOATE 50 MG PO CAPS: 50.0000 mg | ORAL_CAPSULE | Freq: Three times a day (TID) | ORAL | 3 refills | Status: AC | PRN

## 2018-01-31 MED ORDER — AMITRIPTYLINE HCL 50 MG PO TABS: 50.0000 mg | ORAL_TABLET | Freq: Every day | ORAL | 3 refills | Status: DC

## 2018-01-31 MED ORDER — GABAPENTIN 400 MG PO CAPS: ORAL_CAPSULE | ORAL | 3 refills | Status: DC

## 2018-01-31 MED FILL — HYDROCHLOROTHIAZIDE 25 MG T: 25 | 30 days supply | Qty: 30 | Fill #1

## 2018-01-31 MED FILL — IBUPROFEN 800 MG TABLET: 800 | 20 days supply | Qty: 60 | Fill #1

## 2018-01-31 NOTE — Progress Notes (Signed)
Assessment & Plan:  Kendra Griffith was seen today for annual exam.  Diagnoses and all orders for this visit:  Well woman exam without gynecological exam  Type 2 diabetes mellitus with diabetic polyneuropathy, without long-term current use of insulin (HCC) -     Glucose (CBG) -     HgB A1c -     metFORMIN (GLUCOPHAGE-XR) 500 MG 24 hr tablet; Take 1 tablet (500 mg total) by mouth 2 (two) times daily. -     gabapentin (NEURONTIN) 400 MG capsule; TAKE 2 CAPSULES BY MOUTH 3 TIMES DAILY.  Essential hypertension, benign -     hydrochlorothiazide (HYDRODIURIL) 25 MG tablet; Take 1 tablet (25 mg total) by mouth daily.  Chronic pain syndrome -     amitriptyline (ELAVIL) 50 MG tablet; Take 1 tablet (50 mg total) by mouth at bedtime.  Anxiety -     hydrOXYzine (VISTARIL) 50 MG capsule; Take 1 capsule (50 mg total) by mouth 3 (three) times daily as needed.  Memory loss -     Ambulatory referral to Neurology    Patient has been counseled on age-appropriate routine health concerns for screening and prevention. These are reviewed and up-to-date. Referrals have been placed accordingly. Immunizations are up-to-date or declined.    Subjective:   Chief Complaint  Patient presents with  . Annual Exam    Pt. is here for a physical.    HPI Kendra Griffith 52 y.o. female presents to office today for well woman exam. She was referred to [facility] for her mammogram. She has complaints of worsening memory loss.    Memory Loss She does have a history of alcohol abuse. She no longer drinks alcohol. I have instructed her that her worsening memory loss may be a result of her past chronic alcohol abuse however I will refer her to neurology for evaluation of early dementia. She reports a head injury when she was a child. She fell and hit the back of her head against a brick. She remembers blacking out and having fevers for several days thereafter. She is not sure if this has something ot do with her memory loss. She  notes leaving her keys in the front door overnight, losing her wallet recently as well as forgetting to take her medications at times. Her gabapentin may need to be discontinued or reduced.   Chronic Pain She has chronic pain and depression. Most of the medications she is currently taking were prescribed by her previous provider prior to establishing care with me. She still does not feel her pain is adequately controlled despite taking elavil 50 mg, duloxetine 30mg  and gabapentin 800 mg TID. Taking hydroxyzine 50mg  TID for anxiety.     She endorses a head injury when she was  She fell and the back of her head hit a brick. She remembers  Having short term memory loss.  Lost her wallet Left keys in door  Review of Systems  Constitutional: Negative.  Negative for chills, fever, malaise/fatigue and weight loss.  HENT: Negative.  Negative for congestion, hearing loss, sinus pain and sore throat.   Eyes: Negative.  Negative for blurred vision, double vision, photophobia and pain.  Respiratory: Negative.  Negative for cough, sputum production, shortness of breath and wheezing.   Cardiovascular: Negative.  Negative for chest pain and leg swelling.  Gastrointestinal: Negative.  Negative for abdominal pain, constipation, diarrhea, heartburn, nausea and vomiting.  Genitourinary: Negative.   Musculoskeletal: Positive for back pain, joint pain and myalgias.  Chronic pain  Skin: Negative.  Negative for rash.  Neurological: Positive for weakness. Negative for dizziness, tremors, speech change, focal weakness, seizures and headaches.  Endo/Heme/Allergies: Negative.  Negative for environmental allergies.  Psychiatric/Behavioral: Positive for depression and memory loss. Negative for suicidal ideas. The patient is nervous/anxious. The patient does not have insomnia.     Past Medical History:  Diagnosis Date  . Anemia   . Anxiety   . Blood transfusion without reported diagnosis    childbirth  .  Clotting disorder ([place])    childbirth  . Depression   . Diabetes mellitus without complication (HCC)    prediabetes  . Hypertension   . Injury    right side from jumping off bunkbed  . Pre-diabetes     Past Surgical History:  Procedure Laterality Date  . CESAREAN SECTION     three  . cyst removal 1997    . FACIAL RECONSTRUCTION SURGERY    . FOOT SURGERY Bilateral     Family History  Problem Relation Age of Onset  . Heart disease Mother   . Asthma Sister   . Diabetes Sister   . COPD Sister   . Diabetes Sister   . Diabetes Sister   . Breast cancer Neg Hx   . Colon cancer Neg Hx     Social History Reviewed with no changes to be made today.   Outpatient Medications Prior to Visit  Medication Sig Dispense Refill  . acetaminophen-codeine (TYLENOL #3) 300-30 MG tablet Take 1 tablet by mouth every 4 (four) hours as needed. 60 tablet 0  . Biotin (BIOTIN MAXIMUM STRENGTH) 10 MG TABS Take 5,000 mg by mouth daily.     . chlorhexidine (PERIDEX) 0.12 % solution Use as directed 15 mLs in the mouth or throat 2 (two) times daily. 473 mL 0  . famotidine (PEPCID) 20 MG tablet Take 1 tablet (20 mg total) by mouth daily. 60 tablet 3  . ibuprofen (ADVIL,MOTRIN) 800 MG tablet Take 1 tablet (800 mg total) by mouth every 8 (eight) hours as needed. 60 tablet 2  . amitriptyline (ELAVIL) 50 MG tablet Take 1 tablet (50 mg total) by mouth at bedtime. 30 tablet 3  . gabapentin (NEURONTIN) 400 MG capsule TAKE 2 CAPSULES BY MOUTH 3 TIMES DAILY. 180 capsule 3  . hydrochlorothiazide (HYDRODIURIL) 25 MG tablet Take 1 tablet (25 mg total) by mouth daily. 90 tablet 3  . hydrOXYzine (ATARAX/VISTARIL) 25 MG tablet Take 1 tablet (25 mg total) by mouth 3 (three) times daily as needed. 60 tablet 3  . metFORMIN (GLUCOPHAGE-XR) 500 MG 24 hr tablet Take 1 tablet (500 mg total) by mouth daily with breakfast. 90 tablet 3   No facility-administered medications prior to visit.     Allergies  Allergen Reactions  .  Amoxicillin   . Darvocet [Propoxyphene N-Acetaminophen]     Makes her jittery  . Hydrocodone-Acetaminophen     REACTION: Upset stomach  . Percocet [Oxycodone-Acetaminophen]     Makes her jittery  . Valium [Diazepam] Other (See Comments)    UNKNOWN Makes pt. Feel "out of wack"  . Hydrocodone-Acetaminophen Nausea And Vomiting  . Propoxyphene Nausea And Vomiting       Objective:    BP 115/84 (BP Location: Left Arm, Patient Position: Sitting, Cuff Size: Normal)   Pulse 92   Temp 98.6 F (37 C) (Oral)   Ht 5\' 5"  (1.651 m)   Wt 163 lb (73.9 kg)   LMP  (LMP Unknown)   SpO2  97%   BMI 27.12 kg/m  Wt Readings from Last 3 Encounters:  01/31/18 163 lb (73.9 kg)  12/10/17 165 lb (74.8 kg)  10/31/17 165 lb 3.2 oz (74.9 kg)    Physical Exam  Constitutional: She is oriented to person, place, and time. She appears well-developed and well-nourished.  HENT:  Head: Normocephalic and atraumatic.  Right Ear: External ear normal.  Left Ear: External ear normal.  Nose: Nose normal.  Mouth/Throat: Mucous membranes are normal. Abnormal dentition. Dental caries present. Posterior oropharyngeal erythema present. No oropharyngeal exudate. Tonsils are 2+ on the right. Tonsils are 2+ on the left. No tonsillar exudate.  Multiple discolored teeth. Partial edentulous.   Eyes: Pupils are equal, round, and reactive to light. Conjunctivae and EOM are normal. Right eye exhibits no discharge. No scleral icterus.  Neck: Normal range of motion. Neck supple. No tracheal deviation present. No thyromegaly present.  Cardiovascular: Normal rate, regular rhythm and normal heart sounds. Exam reveals no friction rub.  No murmur heard. Pulmonary/Chest: Effort normal and breath sounds normal. No accessory muscle usage. No respiratory distress. She has no decreased breath sounds. She has no wheezes. She has no rhonchi. She has no rales. She exhibits no tenderness. Right breast exhibits no inverted nipple, no mass, no  nipple discharge, no skin change and no tenderness. Left breast exhibits mass. Left breast exhibits no inverted nipple, no nipple discharge, no skin change and no tenderness. Breasts are symmetrical.    Abdominal: Bowel sounds are normal. She exhibits no distension and no mass. There is no tenderness. There is no rebound and no guarding.  Musculoskeletal: Normal range of motion. She exhibits no edema, tenderness or deformity.       Lumbar back: She exhibits pain.       Legs: Lymphadenopathy:    She has no cervical adenopathy.  Neurological: She is alert and oriented to person, place, and time. She has normal strength and normal reflexes. No cranial nerve deficit or sensory deficit. Gait (using a straight cane for mobility) abnormal. Coordination normal.  Skin: Skin is warm and dry. Capillary refill takes less than 2 seconds. No erythema.  Psychiatric: She has a normal mood and affect. Her speech is normal and behavior is normal. Judgment and thought content normal.         Patient has been counseled extensively about nutrition and exercise as well as the importance of adherence with medications and regular follow-up. The patient was given clear instructions to go to ER or return to medical center if symptoms don't improve, worsen or new problems develop. The patient verbalized understanding.   Follow-up: Return in about 3 months (around 05/03/2018) for DM.   [name], FNP-BC [facility] [place] and [place], [place]   01/31/2018, 1:20 PM

## 2018-02-02 ENCOUNTER — Telehealth (HOSPITAL_COMMUNITY): Payer: Self-pay

## 2018-02-02 NOTE — Telephone Encounter (Signed)
No voicemail set up 

## 2018-02-05 ENCOUNTER — Ambulatory Visit: Payer: No Typology Code available for payment source | Admitting: Podiatry

## 2018-02-09 ENCOUNTER — Telehealth (HOSPITAL_COMMUNITY): Payer: Self-pay

## 2018-02-09 NOTE — Telephone Encounter (Signed)
"  No voicemail has been set up.

## 2018-02-12 ENCOUNTER — Ambulatory Visit: Payer: No Typology Code available for payment source | Admitting: Podiatry

## 2018-02-19 ENCOUNTER — Ambulatory Visit: Payer: No Typology Code available for payment source | Admitting: Podiatry

## 2018-03-01 ENCOUNTER — Encounter: Payer: Self-pay | Admitting: Podiatry

## 2018-03-01 ENCOUNTER — Ambulatory Visit: Payer: No Typology Code available for payment source | Admitting: Podiatry

## 2018-03-01 DIAGNOSIS — G621 Alcoholic polyneuropathy: Secondary | ICD-10-CM

## 2018-03-01 DIAGNOSIS — B351 Tinea unguium: Secondary | ICD-10-CM

## 2018-03-01 DIAGNOSIS — M79675 Pain in left toe(s): Secondary | ICD-10-CM

## 2018-03-01 DIAGNOSIS — M79674 Pain in right toe(s): Secondary | ICD-10-CM

## 2018-03-01 DIAGNOSIS — E119 Type 2 diabetes mellitus without complications: Secondary | ICD-10-CM

## 2018-03-01 DIAGNOSIS — L84 Corns and callosities: Secondary | ICD-10-CM

## 2018-03-01 NOTE — Patient Instructions (Addendum)
Diabetes and Foot Care Diabetes may cause you to have problems because of poor blood supply (circulation) to your feet and legs. This may cause the skin on your feet to become thinner, break easier, and heal more slowly. Your skin may become dry, and the skin may peel and crack. You may also have nerve damage in your legs and feet causing decreased feeling in them. You may not notice minor injuries to your feet that could lead to infections or more serious problems. Taking care of your feet is one of the most important things you can do for yourself. Follow these instructions at home:  Wear shoes at all times, even in the house. Do not go barefoot. Bare feet are easily injured.  Check your feet daily for blisters, cuts, and redness. If you cannot see the bottom of your feet, use a mirror or ask someone for help.  Wash your feet with warm water (do not use hot water) and mild soap. Then pat your feet and the areas between your toes until they are completely dry. Do not soak your feet as this can dry your skin.  Apply a moisturizing lotion or petroleum jelly (that does not contain alcohol and is unscented) to the skin on your feet and to dry, brittle toenails. Do not apply lotion between your toes.  Trim your toenails straight across. Do not dig under them or around the cuticle. File the edges of your nails with an emery board or nail file.  Do not cut corns or calluses or try to remove them with medicine.  Wear clean socks or stockings every day. Make sure they are not too tight. Do not wear knee-high stockings since they may decrease blood flow to your legs.  Wear shoes that fit properly and have enough cushioning. To break in new shoes, wear them for just a few hours a day. This prevents you from injuring your feet. Always look in your shoes before you put them on to be sure there are no objects inside.  Do not cross your legs. This may decrease the blood flow to your feet.  If you find a  minor scrape, cut, or break in the skin on your feet, keep it and the skin around it clean and dry. These areas may be cleansed with mild soap and water. Do not cleanse the area with peroxide, alcohol, or iodine.  When you remove an adhesive bandage, be sure not to damage the skin around it.  If you have a wound, look at it several times a day to make sure it is healing.  Do not use heating pads or hot water bottles. They may burn your skin. If you have lost feeling in your feet or legs, you may not know it is happening until it is too late.  Make sure your health care provider performs a complete foot exam at least annually or more often if you have foot problems. Report any cuts, sores, or bruises to your health care provider immediately. Contact a health care provider if:  You have an injury that is not healing.  You have cuts or breaks in the skin.  You have an ingrown nail.  You notice redness on your legs or feet.  You feel burning or tingling in your legs or feet.  You have pain or cramps in your legs and feet.  Your legs or feet are numb.  Your feet always feel cold. Get help right away if:  There is increasing   redness, swelling, or pain in or around a wound.  There is a red line that goes up your leg.  Pus is coming from a wound.  You develop a fever or as directed by your health care provider.  You notice a bad smell coming from an ulcer or wound. This information is not intended to replace advice given to you by your health care provider. Make sure you discuss any questions you have with your health care provider. Document Released: 03/25/2000 Document Revised: 09/03/2015 Document Reviewed: 09/04/2012 Elsevier Interactive Patient Education  2017 [place] are small areas of thickened skin that occur on the top, sides, or tip of a toe. They contain a cone-shaped core with a point that can press on a nerve below. This causes pain.  Calluses are areas of thickened skin that can occur anywhere on the body including hands, fingers, palms, soles of the feet, and heels.Calluses are usually larger than corns. What are the causes? Corns and calluses are caused by rubbing (friction) or pressure, such as from shoes that are too tight or do not fit properly. What increases the risk? Corns are more likely to develop in people who have toe deformities, such as hammer toes. Since calluses can occur with friction to any area of the skin, calluses are more likely to develop in people who: Work with their hands. Wear shoes that fit poorly, shoes that are too tight, or shoes that are high-heeled. Have toes deformities.  What are the signs or symptoms? Symptoms of a corn or callus include: A hard growth on the skin. Pain or tenderness under the skin. Redness and swelling. Increased discomfort while wearing tight-fitting shoes.  How is this diagnosed? Corns and calluses may be diagnosed with a medical history and physical exam. How is this treated? Corns and calluses may be treated with: Removing the cause of the friction or pressure. This may include: Changing your shoes. Wearing shoe inserts (orthotics) or other protective layers in your shoes, such as a corn pad. Wearing gloves. Medicines to help soften skin in the hardened, thickened areas. Reducing the size of the corn or callus by removing the dead layers of skin. Antibiotic medicines to treat infection. Surgery, if a toe deformity is the cause.  Follow these instructions at home: Take medicines only as directed by your health care provider. If you were prescribed an antibiotic, finish all of it even if you start to feel better. Wear shoes that fit well. Avoid wearing high-heeled shoes and shoes that are too tight or too loose. Wear any padding, protective layers, gloves, or orthotics as directed by your health care provider. Soak your hands or feet and then use a file  or pumice stone to soften your corn or callus. Do this as directed by your health care provider. Check your corn or callus every day for signs of infection. Watch for: Redness, swelling, or pain. Fluid, blood, or pus. Contact a health care provider if: Your symptoms do not improve with treatment. You have increased redness, swelling, or pain at the site of your corn or callus. You have fluid, blood, or pus coming from your corn or callus. You have new symptoms. This information is not intended to replace advice given to you by your health care provider. Make sure you discuss any questions you have with your health care provider. Document Released: 01/02/2004 Document Revised: 10/16/2015 Document Reviewed: 03/24/2014 Elsevier Interactive Patient Education  2018 [place]  WHAT IS IT? An infection that lies within the keratin of your nail plate that is caused by a fungus.  WHY ME? Fungal infections affect all ages, sexes, races, and creeds.  There may be many factors that predispose you to a fungal infection such as age, coexisting medical conditions such as diabetes, or an autoimmune disease; stress, medications, fatigue, genetics, etc.  Bottom line: fungus thrives in a warm, moist environment and your shoes offer such a location.  IS IT CONTAGIOUS? Theoretically, yes.  You do not want to share shoes, nail clippers or files with someone who has fungal toenails.  Walking around barefoot in the same room or sleeping in the same bed is unlikely to transfer the organism.  It is important to realize, however, that fungus can spread easily from one nail to the next on the same foot.  HOW DO WE TREAT THIS?  There are several ways to treat this condition.  Treatment may depend on many factors such as age, medications, pregnancy, liver and kidney conditions, etc.  It is best to ask your doctor which options are available to you.  1. No treatment.   Unlike many other  medical concerns, you can live with this condition.  However for many people this can be a painful condition and may lead to ingrown toenails or a bacterial infection.  It is recommended that you keep the nails cut short to help reduce the amount of fungal nail. 2. Topical treatment.  These range from herbal remedies to prescription strength nail lacquers.  About 40-50% effective, topicals require twice daily application for approximately 9 to 12 months or until an entirely new nail has grown out.  The most effective topicals are medical grade medications available through physicians offices. 3. Oral antifungal medications.  With an 80-90% cure rate, the most common oral medication requires 3 to 4 months of therapy and stays in your system for a year as the new nail grows out.  Oral antifungal medications do require blood work to make sure it is a safe drug for you.  A liver function panel will be performed prior to starting the medication and after the first month of treatment.  It is important to have the blood work performed to avoid any harmful side effects.  In general, this medication safe but blood work is required. 4. Laser Therapy.  This treatment is performed by applying a specialized laser to the affected nail plate.  This therapy is noninvasive, fast, and non-painful.  It is not covered by insurance and is therefore, out of pocket.  The results have been very good with a 80-95% cure rate.  [facility] is the only practice in the area to offer this therapy. 5. Permanent Nail Avulsion.  Removing the entire nail so that a new nail will not grow back.

## 2018-03-19 MED FILL — GABAPENTIN 400 MG CAPSULE: 400 | 30 days supply | Qty: 180 | Fill #0

## 2018-03-19 MED FILL — HYDROXYZINE PAM 50 MG CAP: 50 | 30 days supply | Qty: 90 | Fill #0

## 2018-03-19 MED FILL — AMITRIPTYLINE HCL 50 MG TAB: 50 | 30 days supply | Qty: 30 | Fill #0

## 2018-03-19 MED FILL — HYDROCHLOROTHIAZIDE 25 MG T: 25 | 30 days supply | Qty: 30 | Fill #0

## 2018-03-19 MED FILL — metFORMIN HCL 500 MG TABS: 500 | 30 days supply | Qty: 60 | Fill #0

## 2018-03-19 NOTE — Progress Notes (Signed)
Subjective: Kendra Griffith presents with diabetes, alcoholic neuropathy and cc of painful, discolored, thick toenails and painful callus/corn which interfere with activities of daily living. Pain is aggravated when wearing enclosed shoe gear. Pain is relieved with periodic professional debridement.  Kendra Griffith states her calluses are bothering her on today on the plantar aspect of both feet.    Current Outpatient Medications:  .  acetaminophen-codeine (TYLENOL #3) 300-30 MG tablet, Take 1 tablet by mouth every 4 (four) hours as needed., Disp: 60 tablet, Rfl: 0 .  amitriptyline (ELAVIL) 50 MG tablet, Take 1 tablet (50 mg total) by mouth at bedtime., Disp: 30 tablet, Rfl: 3 .  Biotin (BIOTIN MAXIMUM STRENGTH) 10 MG TABS, Take 5,000 mg by mouth daily. , Disp: , Rfl:  .  chlorhexidine (PERIDEX) 0.12 % solution, Use as directed 15 mLs in the mouth or throat 2 (two) times daily., Disp: 473 mL, Rfl: 0 .  famotidine (PEPCID) 20 MG tablet, Take 1 tablet (20 mg total) by mouth daily., Disp: 60 tablet, Rfl: 3 .  gabapentin (NEURONTIN) 400 MG capsule, TAKE 2 CAPSULES BY MOUTH 3 TIMES DAILY., Disp: 180 capsule, Rfl: 3 .  hydrochlorothiazide (HYDRODIURIL) 25 MG tablet, Take 1 tablet (25 mg total) by mouth daily., Disp: 90 tablet, Rfl: 3 .  ibuprofen (ADVIL,MOTRIN) 800 MG tablet, Take 1 tablet (800 mg total) by mouth every 8 (eight) hours as needed., Disp: 60 tablet, Rfl: 2 .  metFORMIN (GLUCOPHAGE) 500 MG tablet, , Disp: , Rfl: 3 .  metFORMIN (GLUCOPHAGE-XR) 500 MG 24 hr tablet, Take 1 tablet (500 mg total) by mouth 2 (two) times daily., Disp: 90 tablet, Rfl: 3  Allergies  Allergen Reactions  . Amoxicillin   . Darvocet [Propoxyphene N-Acetaminophen]     Makes her jittery  . Hydrocodone-Acetaminophen     REACTION: Upset stomach  . Percocet [Oxycodone-Acetaminophen]     Makes her jittery  . Valium [Diazepam] Other (See Comments)    UNKNOWN Makes pt. Feel "out of wack"  . Hydrocodone-Acetaminophen  Nausea And Vomiting  . Propoxyphene Nausea And Vomiting    Vascular Examination: Capillary refill time <3 seconds x 10 digits Dorsalis pedis and Posterior tibial pulses present b/l No digital hair x 10 digits Skin temperature WNL b/l  Dermatological Examination: Skin with normal turgor, texture and tone b/l  Toenails 1-5 b/l discolored, thick, dystrophic with subungual debris and pain with palpation to nailbeds due to thickness of nails.  Hyperkeratotic lesions noted submet head 5 b/l, submet head 2 left and submet head 1 right foot. No erythema, no edema, no drainage associated with any of these lesions. There is tenderness to palpation.  Musculoskeletal: Muscle strength 5/5 to all LE muscle groups  Neurological: Sensation intact with 10 gram monofilament. Vibratory sensation intact  Assessment: 1. Painful onychomycosis toenails 1-5 b/l 2. Calluses submet head 5 b/l, submet head 2 left and submet head 1 right foot 3. NIDDM with alcoholic neuropathy  Plan: 1. Continue diabetic foot care principles.  2. Toenails 1-5 b/l were debrided in length and girth without iatrogenic bleeding. 3. Hyperkeratotic lesion pared with sterile chisel blade submet head 5 b/l, submet head 2 left and submet head 1 right foot 4. Patient to continue soft, supportive shoe gear 5. Patient to report any pedal injuries to medical professional  6. Follow up 3 months. Patient/POA to call should there be a concern in the interim.

## 2018-03-26 ENCOUNTER — Encounter: Payer: Self-pay | Admitting: Neurology

## 2018-03-26 ENCOUNTER — Encounter

## 2018-03-26 ENCOUNTER — Ambulatory Visit (INDEPENDENT_AMBULATORY_CARE_PROVIDER_SITE_OTHER): Payer: Medicaid Other | Admitting: Neurology

## 2018-03-26 VITALS — BP 100/78 | HR 89 | Ht 65.0 in | Wt 163.5 lb

## 2018-03-26 DIAGNOSIS — R292 Abnormal reflex: Secondary | ICD-10-CM | POA: Diagnosis not present

## 2018-03-26 DIAGNOSIS — G629 Polyneuropathy, unspecified: Secondary | ICD-10-CM

## 2018-03-26 DIAGNOSIS — R413 Other amnesia: Secondary | ICD-10-CM | POA: Diagnosis not present

## 2018-03-26 NOTE — Patient Instructions (Addendum)
We will order MRI lumbar spine and skin biopsy  Referral for physical therapy for back pain  Please follow-up with your psychiatrist to address your depression, as this is the most common cause of memory changes  Recommend you establish care with pain management for fibromyalgia

## 2018-03-26 NOTE — Progress Notes (Signed)
Follow-up Visit   Date: 03/26/18    HAYDON KALMAR MRN: [id number] DOB: September 10, 1965   Interim History: Kendra Griffith is a 52 y.o. right-handed African American female with depression/anxiety, hypertension, diabetes mellitus, chronic pain syndrome and history of alcohol abuse returning to the clinic for follow-up of generalized pain.  The patient was accompanied to the clinic by self.  History of present illness: She has a long history of neuropathy involving the feet dating back to 1980-1990s manifesting with numbness involving the soles of the feet.  She was drinking heavily, about a bottle of vodka daily, since the age of 43.   She has been sober since 2013, when she was diagnosed with diabetes.  Her diabetes has been well-controlled on oral medication, last HbA1c is 6.3.   She denies any numbness of the hands.  She has some imbalance and has been walking with a cane for the past two years.   She takes gabapentin 800mg  three times daily and sometimes, she will take extra from her friend.  She also takes amitriptyine 50mg  and Cymbalta 30mg  for her depression.  Over the past 10 years, she has sporadic electrical pain which involves her arms, legs, and neck.  Pain is unpredictable and severe.  She has been recommended to see pain management, which she has not scheduled yet.  She was diagnosed with diabetes in 2013.    UPDATE 03/26/2018:  She is here with complaints of ongoing pain.  At her last visit in March, she underwent NCS/EMG of the right arm and leg which was normal and did not show large fiber neuropathy.  She subsequently had MRI cervical spine which mild multilevel degenerative changes.  She complains of low back pain, described as burning and "horrible".   She has popping sounds in her back.   She had fluttering sensation in the legs.  She has discomfort of the wrist.  She is forgetful and had lost her wallet three times in the same store.  She lives alone.  Her son manages her  finances and medications.  She is depressed and followed by psychiatry.  Medications:  Current Outpatient Medications on File Prior to Visit  Medication Sig Dispense Refill  . acetaminophen-codeine (TYLENOL #3) 300-30 MG tablet Take 1 tablet by mouth every 4 (four) hours as needed. 60 tablet 0  . amitriptyline (ELAVIL) 50 MG tablet Take 1 tablet (50 mg total) by mouth at bedtime. 30 tablet 3  . Biotin (BIOTIN MAXIMUM STRENGTH) 10 MG TABS Take 5,000 mg by mouth daily.     . chlorhexidine (PERIDEX) 0.12 % solution Use as directed 15 mLs in the mouth or throat 2 (two) times daily. 473 mL 0  . famotidine (PEPCID) 20 MG tablet Take 1 tablet (20 mg total) by mouth daily. 60 tablet 3  . gabapentin (NEURONTIN) 400 MG capsule TAKE 2 CAPSULES BY MOUTH 3 TIMES DAILY. 180 capsule 3  . hydrochlorothiazide (HYDRODIURIL) 25 MG tablet Take 1 tablet (25 mg total) by mouth daily. 90 tablet 3  . ibuprofen (ADVIL,MOTRIN) 800 MG tablet Take 1 tablet (800 mg total) by mouth every 8 (eight) hours as needed. 60 tablet 2  . metFORMIN (GLUCOPHAGE-XR) 500 MG 24 hr tablet Take 1 tablet (500 mg total) by mouth 2 (two) times daily. 90 tablet 3   No current facility-administered medications on file prior to visit.     Allergies:  Allergies  Allergen Reactions  . Amoxicillin   . Darvocet [Propoxyphene N-Acetaminophen]  Makes her jittery  . Hydrocodone-Acetaminophen     REACTION: Upset stomach  . Percocet [Oxycodone-Acetaminophen]     Makes her jittery  . Valium [Diazepam] Other (See Comments)    UNKNOWN Makes pt. Feel "out of wack"  . Hydrocodone-Acetaminophen Nausea And Vomiting  . Propoxyphene Nausea And Vomiting    Review of Systems:  CONSTITUTIONAL: No fevers, chills, night sweats, or weight loss.  EYES: No visual changes or eye pain ENT: No hearing changes.  No history of nose bleeds.   RESPIRATORY: No cough, wheezing and shortness of breath.   CARDIOVASCULAR: Negative for chest pain, and  palpitations.   GI: +for abdominal discomfort, blood in stools or black stools.  No recent change in bowel habits.   GU:  +history of incontinence.   MUSCLOSKELETAL: +history of joint pain or swelling.  +myalgias.   SKIN: Negative for lesions, rash, and itching.   ENDOCRINE: Negative for cold or heat intolerance, polydipsia or goiter.   PSYCH:  + depression or anxiety symptoms.   NEURO: As Above.   Vital Signs:  BP 100/78   Pulse 89   Ht 5\' 5"  (1.651 m)   Wt 163 lb 8 oz (74.2 kg)   SpO2 97%   BMI 27.21 kg/m    General Medical Exam:   General:  Well appearing, comfortable  Eyes/ENT: see cranial nerve examination.  Poor dentition Neck: No masses appreciated.  Full range of motion without tenderness.  No carotid bruits. Respiratory:  Clear to auscultation, good air entry bilaterally.   Cardiac:  Regular rate and rhythm, no murmur.   Ext:  Tenderness to light muscle pressure during motor strength testing  Neurological Exam: MENTAL STATUS including orientation to time, place, person, recent and remote memory, attention span and concentration, language, and fund of knowledge is normal.  Speech is not dysarthric.  CRANIAL NERVES: No visual field defects.  Pupils equal round and reactive to light.  Normal conjugate, extra-ocular eye movements in all directions of gaze.  No ptosis. Normal facial sensation.  Face is symmetric. Palate elevates symmetrically.  Tongue is midline.  MOTOR:  Motor strength testing is limited by patient's effort, all muscle groups are antigravity and able to resist.  No atrophy, fasciculations or abnormal movements.  No pronator drift.  Tone is normal.    MSRs:  Reflexes are 3+/4 throughout, except 2+/4 bilateral Achilles.  SENSORY:  Intact to vibration and temperature throughout.  COORDINATION/GAIT:  Normal finger-to- nose-finger.  Intact rapid alternating movements bilaterally.  Gait slow, antalgic, assisted with cane.  Data: NCS/EMG of the right arm and  leg 06/22/2017:  This is a normal study of the right upper and lower extremities. In particular, there is no evidence of a large fiber sensorimotor polyneuropathy or cervical/lumbosacral radiculopathy.  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  MRI cervical spine wo contrast 06/26/2017:   1. Mild degenerative disc bulging at C3-4 through C6-7 with resultant mild spinal stenosis at the C5-6 level. 2. Left foraminal disc osteophyte complex at C7-T1 with resultant mild to moderate left C8 foraminal stenosis. No other significant foraminal encroachment within the cervical spine.  Labs 06/09/2017:  Folate > 23.6, vitamin B12 1285, copper 152, vitamin B1 10, TSH 2.68  IMPRESSION/PLAN: 1.  Generalized pain, no evidence of large fiber neuropathy on NCS/EMG.  Recommend skin biopsy to evaluate for small fiber neuropathy, I will request this through her podiatrist.   2.  Low back pain.  MRI lumbar spine will be ordered to  evaluation for lumbar canal stenosis give her brisk reflexes.  MRI cervical spine was personally viewed from March 2019 which shows mild multilevel degenerative changes, no canal impingement to explain hyperreflexia.  Start physical therapy for low back pain  3.  Memory changes, most likely stemming from mood disorder/depression. She also has a long history of alcohol abuse which can have late implication to brain health. Fortunately, she has been sober since 2013. Her vitamin B1 and b12 is normal.  Consider MRI brain going forward.  I have recommended that she work with her psychiatrist to optimize mood, as this can lead to cognitive improvement.  Mentally stimulating activities recommended.   4.  Fibromyalgia, chronic pain syndrome.  Recommend establishing care with pain management as this is not a condition we treat in our office.   Further recommendations pending results.  Thank you for allowing me to participate in patient's care.  If I can answer any  additional questions, I would be pleased to do so.    Sincerely,    Donika K. Posey Pronto, DO

## 2018-03-27 ENCOUNTER — Telehealth: Payer: Self-pay | Admitting: Neurology

## 2018-03-27 NOTE — Telephone Encounter (Signed)
Patient is calling in to update her chart: she is back spasms in lower part of back.  She said that you needed this info in order to schedule PT appts for her back. Just FYI. Please update chart no need to call back. Thanks!

## 2018-03-28 NOTE — Telephone Encounter (Signed)
Noted  

## 2018-04-02 ENCOUNTER — Ambulatory Visit: Payer: Medicaid Other | Admitting: Physical Therapy

## 2018-04-13 ENCOUNTER — Encounter

## 2018-04-13 ENCOUNTER — Ambulatory Visit: Payer: Self-pay | Admitting: Neurology

## 2018-04-17 ENCOUNTER — Telehealth: Payer: Self-pay | Admitting: Neurology

## 2018-04-17 ENCOUNTER — Other Ambulatory Visit: Payer: Self-pay | Admitting: *Deleted

## 2018-04-17 DIAGNOSIS — E0842 Diabetes mellitus due to underlying condition with diabetic polyneuropathy: Secondary | ICD-10-CM

## 2018-04-17 DIAGNOSIS — G621 Alcoholic polyneuropathy: Secondary | ICD-10-CM

## 2018-04-17 NOTE — Telephone Encounter (Signed)
I called patient back and she was referring to a biopsy.  She is scheduled to see them but they say they do not have our records.  I will fax them again.

## 2018-04-17 NOTE — Telephone Encounter (Signed)
Patient called and needs to speak with you regarding a Nerve Block? Please Call. Thanks

## 2018-04-17 NOTE — Telephone Encounter (Addendum)
This should be sent to her podiatrist  - Dr. Acquanetta Sit - for skin biopsy for small fiber neuropathy.  Referral does not need to be sent to general surgery - please cancel.  Kendra K. Posey Pronto, DO

## 2018-04-17 NOTE — Telephone Encounter (Signed)
General surgery cancelled.  Will check with Dr. Elisha Ponder.

## 2018-04-18 ENCOUNTER — Telehealth: Payer: Self-pay | Admitting: Neurology

## 2018-04-18 ENCOUNTER — Other Ambulatory Visit: Payer: Medicaid Other

## 2018-04-18 NOTE — Telephone Encounter (Signed)
Patient called and left a voicemail needing to speak with you about her MRI. She said she had some info to give you. Please call her back at [phone]. Thanks!

## 2018-04-18 NOTE — Telephone Encounter (Signed)
I called patient and she is going to cancel the MRI for today.  I will ask Dr. Posey Pronto to do a peer to peer.

## 2018-04-23 ENCOUNTER — Ambulatory Visit: Payer: Medicaid Other | Attending: Nurse Practitioner | Admitting: Physical Therapy

## 2018-04-23 ENCOUNTER — Other Ambulatory Visit: Payer: Self-pay

## 2018-04-23 ENCOUNTER — Encounter: Payer: Self-pay | Admitting: Physical Therapy

## 2018-04-23 DIAGNOSIS — R262 Difficulty in walking, not elsewhere classified: Secondary | ICD-10-CM | POA: Diagnosis present

## 2018-04-23 DIAGNOSIS — R296 Repeated falls: Secondary | ICD-10-CM | POA: Diagnosis present

## 2018-04-23 DIAGNOSIS — M545 Low back pain: Secondary | ICD-10-CM | POA: Insufficient documentation

## 2018-04-23 DIAGNOSIS — M6281 Muscle weakness (generalized): Secondary | ICD-10-CM | POA: Insufficient documentation

## 2018-04-23 DIAGNOSIS — G8929 Other chronic pain: Secondary | ICD-10-CM | POA: Diagnosis present

## 2018-04-23 DIAGNOSIS — R2681 Unsteadiness on feet: Secondary | ICD-10-CM

## 2018-04-23 DIAGNOSIS — R208 Other disturbances of skin sensation: Secondary | ICD-10-CM | POA: Insufficient documentation

## 2018-04-23 NOTE — Therapy (Signed)
[facility] [address] [place], [place], [address] Phone: [phone]   Fax:  [phone]  Physical Therapy Evaluation  Patient Details  Name: Kendra Griffith MRN: [id number] Date of Birth: Aug 21, 1965 Referring Provider (PT): Alda Berthold, DO   Encounter Date: 04/23/2018  PT End of Session - 04/23/18 1129    Visit Number  1    Number of Visits  4    Date for PT Re-Evaluation  05/23/18    Authorization Type  Awaiting Medicaid approval of initial visits    PT Start Time  1022    PT Stop Time  1106    PT Time Calculation (min)  44 min    Equipment Utilized During Treatment  --    Activity Tolerance  Patient tolerated treatment well;Patient limited by pain    Behavior During Therapy  [facility] for tasks assessed/performed       Past Medical History:  Diagnosis Date  . Anemia   . Anxiety   . Blood transfusion without reported diagnosis    childbirth  . Clotting disorder ([place])    childbirth  . Depression   . Diabetes mellitus without complication (HCC)    prediabetes  . Hypertension   . Injury    right side from jumping off bunkbed  . Pre-diabetes     Past Surgical History:  Procedure Laterality Date  . CESAREAN SECTION     three  . cyst removal 1997    . FACIAL RECONSTRUCTION SURGERY    . FOOT SURGERY Bilateral     There were no vitals filed for this visit.   Subjective Assessment - 04/23/18 1025    Subjective  Pt returns to physical therapy with worsening back pain in all positions.  Pt is also hearing clicking noises in her back.  Pt wearing lumbar corsett.  Pt had a fall last month in her hallway.      Pertinent History  HTN, DM, depression, clotting disorder, anxiety, anemia, chronic pain, alcohol peripheral neuropathy    Limitations  House hold activities;Walking;Standing    How long can you stand comfortably?  1 hour    How long can you walk comfortably?  half a block    Diagnostic tests  Has  upcoming imaging    Patient Stated Goals  to improve mobility, strengthening her back and legs, throwing a ball with her grandkids.  To exercise without fear    Currently in Pain?  Yes    Pain Score  9     Pain Location  Back    Pain Orientation  Lower    Pain Descriptors / Indicators  Spasm    Pain Type  Chronic pain    Pain Onset  More than a month ago         [facility] PT Assessment - 04/23/18 1032      Assessment   Medical Diagnosis  low back pain, LE weakness    Referring Provider (PT)  Alda Berthold, DO    Onset Date/Surgical Date  03/26/18    Prior Therapy  yes, outpatient PT for neuropathy      Precautions   Precautions  Fall    Precaution Comments  HTN, DM, depression, clotting disorder, anxiety, anemia, chronic pain, alcohol peripheral neuropathy    Required Braces or Orthoses  Spinal Brace    Spinal Brace  Lumbar corset   uses for comfort/support     Restrictions   Weight Bearing Restrictions  No  Balance Screen   Has the patient fallen in the past 6 months  Yes    How many times?  2-3      Home Environment   Living Environment  Private residence    Living Arrangements  Alone    Available Help at Discharge  Family;Available PRN/intermittently    Type of Home  Apartment    Home Access  Level entry    Home Layout  One level    [place] - single point;Walker - 2 wheels    Additional Comments  uses the cane in her apartment, uses RW when walking to mailbox.  Not using an AD today      Prior Function   Level of Independence  Independent with household mobility with device;Independent with community mobility with device;Independent with gait;Independent with transfers;Requires assistive device for independence      Observation/Other Assessments   Focus on Therapeutic Outcomes (FOTO)   Not assessed      Sensation   Light Touch  Impaired Detail    Light Touch Impaired Details  Impaired RLE;Impaired LLE    Additional Comments  reports shooting pain  down posterior LE, numbness in bilat feet from neuropathy      Posture/Postural Control   Posture/Postural Control  --      ROM / Strength   AROM / PROM / Strength  Strength;AROM      AROM   Overall AROM   Deficits    Overall AROM Comments  R hamstring length supine: 60 deg.  L hamstring: 68 deg.  Hip IR/ER: limited bilaterally with pain on hip IR on L side.        Strength   Overall Strength  Deficits    Overall Strength Comments  Pt reports dropping items from RUE.  LE: LLE 4-/5 overall.  RLE: 3+/5 hip flexion, 4-/5 knee extension, 3-/5 knee flexion and ankle DF      Flexibility   Soft Tissue Assessment /Muscle Length  yes    Hamstrings  60 deg on R, 68 deg on L      Palpation   Palpation comment  Notable tenderness and active trigger points in R piriformis and hamstring muscles but reports increased pain on L side      Ambulation/Gait   Ambulation/Gait  Yes    Ambulation/Gait Assistance  6: Modified independent (Device/Increase time)    Ambulation Distance (Feet)  115 Feet    Assistive device  None    Gait Pattern  Step-through pattern;Decreased step length - left;Decreased stride length;Decreased hip/knee flexion - left;Decreased dorsiflexion - left;Antalgic;Lateral trunk lean to right;Narrow base of support   L hip drop indicating R glute med weakness   Ambulation Surface  Level;Indoor      Standardized Balance Assessment   Standardized Balance Assessment  10 meter walk test    10 Meter Walk  15.6 seconds or 2.1 ft/sec                Objective measurements completed on examination: See above findings.              PT Education - 04/23/18 1128    Education provided  Yes    Education Details  clinical findings, PT POC and goals    Person(s) Educated  Patient    Methods  Explanation    Comprehension  Verbalized understanding       PT Short Term Goals - 04/23/18 1140      PT SHORT TERM GOAL #  1   Title  = LTG        PT Long Term Goals -  04/23/18 1134      PT LONG TERM GOAL #1   Title  Pt will be independent and compliant with initial HEP    Baseline  dependent currently    Time  4    Period  Weeks    Status  New    Target Date  05/23/18      PT LONG TERM GOAL #2   Title  Pt will improve five time sit to stand without UE support to </= 15 seconds     Baseline  not assessed to date    Time  4    Period  Weeks    Status  New    Target Date  05/23/18      PT LONG TERM GOAL #3   Title  Pt will improve gait velocity to >/= 2.62 ft/sec without AD    Baseline  2.1 ft/sec without AD    Time  4    Period  Weeks    Status  New    Target Date  05/23/18      PT LONG TERM GOAL #4   Title  Pt will improve BERG score by 4 points to indicate decreased falls risk    Baseline  not assessed to date    Time  4    Period  Weeks    Status  New    Target Date  05/23/18      PT LONG TERM GOAL #5   Title  Pt will demonstrate 10 deg increase in pain free ROM to bilat hamstrings    Baseline  60 deg right hamstring, 68 deg left hamstring    Time  4    Period  Weeks    Status  New    Target Date  05/23/18             Plan - 04/23/18 1131    Clinical Impression Statement  Pt is a 53 year old female referred to Neuro OPPT for evaluation of back pain and weakness.  Pt's PMH is significant for the following: HTN, DM, depression, clotting disorder, anxiety, anemia, chronic pain, alcohol peripheral neuropathy.  The following deficits were noted during pt's exam: pain in low back that radiates into bilat LE, impaired ROM and strength in bilat LE, impaired sensation, impaired gait and impaired balance.  Pt's gait velocity score indicates pt is at risk for recurrent falls. Pt would benefit from skilled PT to address these impairments and functional limitations to maximize functional mobility independence and reduce falls risk.    History and Personal Factors relevant to plan of care:  lives alone, multiple falls, worsening back pain  with radicular symptoms, HTN, DM, depression, clotting disorder, anxiety, anemia, chronic pain, alcohol peripheral neuropathy    Clinical Presentation  Evolving    Clinical Presentation due to:  lives alone, multiple falls, worsening back pain with radicular symptoms, HTN, DM, depression, clotting disorder, anxiety, anemia, chronic pain, alcohol peripheral neuropathy    Clinical Decision Making  Moderate    Rehab Potential  Fair    Clinical Impairments Affecting Rehab Potential  chronicity of pain, co-morbidities    PT Frequency  1x / week    PT Duration  3 weeks    PT Treatment/Interventions  ADLs/Self Care Home Management;Aquatic Therapy;Cryotherapy;Electrical Stimulation;Moist Heat;DME Instruction;Gait training;Stair training;Functional mobility training;Therapeutic activities;Therapeutic exercise;Balance training;Neuromuscular re-education;Patient/family education;Orthotic Fit/Training;Manual techniques;Passive range of motion;Dry  needling;Taping;Spinal Manipulations    PT Next Visit Plan  Assess spinal ROM and response to gentle manual therapy to spine.  Assess five time sit to stand and BERG and reset goals.  Initiate HEP: hamstring and piriformis stretching, LE strengthening, core stabilization, standing balance.  Discuss dry needling as a treatment option    Consulted and Agree with Plan of Care  Patient       Patient will benefit from skilled therapeutic intervention in order to improve the following deficits and impairments:  Abnormal gait, Decreased activity tolerance, Decreased balance, Decreased endurance, Decreased mobility, Decreased range of motion, Decreased strength, Difficulty walking, Increased muscle spasms, Impaired flexibility, Impaired sensation, Pain  Visit Diagnosis: Chronic bilateral low back pain, unspecified whether sciatica present  Muscle weakness (generalized)  Unsteadiness on feet  Other disturbances of skin sensation  Difficulty in walking, not elsewhere  classified  Repeated falls     Problem List Patient Active Problem List   Diagnosis Date Noted  . Alcoholic peripheral neuropathy ([place]) 06/09/2017  . Tobacco use 06/09/2017  . Moderate episode of recurrent major depressive disorder ([place]) 05/04/2016  . Breast cancer screening 01/21/2016  . Chronic pain syndrome 09/25/2014  . Type 2 diabetes mellitus with diabetic polyneuropathy, without long-term current use of insulin ([place]) 06/30/2014  . Peripheral neuropathy 05/09/2013  . Poor dentition 02/01/2013  . Pain in joint, ankle and foot 02/01/2013  . Essential hypertension, benign 02/01/2013  . FOOT PAIN, BILATERAL 06/16/2010  . RECTAL BLEEDING 12/08/2009  . HEMATURIA UNSPECIFIED 12/08/2009  . DEPRESSION 08/20/2009  . ABSCESS, TOOTH 08/20/2009  . HYPERLIPIDEMIA 07/30/2009  . SICKLE-CELL TRAIT 07/30/2009  . ANEMIA-NOS 07/30/2009  . ALCOHOLISM 07/30/2009  . GERD 07/30/2009  . CALLUS, FOOT 07/30/2009    Rico Junker, PT, DPT 04/23/18    11:40 AM    [facility] [address] [place], [place], [address] Phone: [phone]   Fax:  [phone]  Name: Kendra Griffith MRN: [id number] Date of Birth: 01/02/1966

## 2018-05-01 ENCOUNTER — Ambulatory Visit: Payer: Medicaid Other | Admitting: Physical Therapy

## 2018-05-04 ENCOUNTER — Ambulatory Visit: Payer: Medicaid Other | Attending: Nurse Practitioner | Admitting: Nurse Practitioner

## 2018-05-04 ENCOUNTER — Ambulatory Visit: Payer: Medicaid Other | Admitting: Physical Therapy

## 2018-05-04 ENCOUNTER — Encounter: Payer: Self-pay | Admitting: Nurse Practitioner

## 2018-05-04 VITALS — BP 117/77 | HR 76 | Temp 97.4°F | Ht 65.0 in | Wt 168.2 lb

## 2018-05-04 DIAGNOSIS — E782 Mixed hyperlipidemia: Secondary | ICD-10-CM | POA: Insufficient documentation

## 2018-05-04 DIAGNOSIS — G894 Chronic pain syndrome: Secondary | ICD-10-CM | POA: Insufficient documentation

## 2018-05-04 DIAGNOSIS — M545 Low back pain, unspecified: Secondary | ICD-10-CM

## 2018-05-04 DIAGNOSIS — G8929 Other chronic pain: Secondary | ICD-10-CM

## 2018-05-04 DIAGNOSIS — R262 Difficulty in walking, not elsewhere classified: Secondary | ICD-10-CM

## 2018-05-04 DIAGNOSIS — Z8249 Family history of ischemic heart disease and other diseases of the circulatory system: Secondary | ICD-10-CM | POA: Insufficient documentation

## 2018-05-04 DIAGNOSIS — E1142 Type 2 diabetes mellitus with diabetic polyneuropathy: Secondary | ICD-10-CM | POA: Insufficient documentation

## 2018-05-04 DIAGNOSIS — I10 Essential (primary) hypertension: Secondary | ICD-10-CM | POA: Insufficient documentation

## 2018-05-04 DIAGNOSIS — Z833 Family history of diabetes mellitus: Secondary | ICD-10-CM | POA: Insufficient documentation

## 2018-05-04 DIAGNOSIS — F419 Anxiety disorder, unspecified: Secondary | ICD-10-CM | POA: Insufficient documentation

## 2018-05-04 DIAGNOSIS — E118 Type 2 diabetes mellitus with unspecified complications: Secondary | ICD-10-CM

## 2018-05-04 DIAGNOSIS — Z79899 Other long term (current) drug therapy: Secondary | ICD-10-CM | POA: Insufficient documentation

## 2018-05-04 DIAGNOSIS — R2681 Unsteadiness on feet: Secondary | ICD-10-CM

## 2018-05-04 DIAGNOSIS — M6281 Muscle weakness (generalized): Secondary | ICD-10-CM

## 2018-05-04 DIAGNOSIS — R296 Repeated falls: Secondary | ICD-10-CM

## 2018-05-04 DIAGNOSIS — F329 Major depressive disorder, single episode, unspecified: Secondary | ICD-10-CM | POA: Insufficient documentation

## 2018-05-04 DIAGNOSIS — Z7984 Long term (current) use of oral hypoglycemic drugs: Secondary | ICD-10-CM | POA: Insufficient documentation

## 2018-05-04 DIAGNOSIS — M549 Dorsalgia, unspecified: Secondary | ICD-10-CM | POA: Insufficient documentation

## 2018-05-04 DIAGNOSIS — F17200 Nicotine dependence, unspecified, uncomplicated: Secondary | ICD-10-CM

## 2018-05-04 DIAGNOSIS — R208 Other disturbances of skin sensation: Secondary | ICD-10-CM

## 2018-05-04 DIAGNOSIS — IMO0002 Reserved for concepts with insufficient information to code with codable children: Secondary | ICD-10-CM

## 2018-05-04 DIAGNOSIS — Z880 Allergy status to penicillin: Secondary | ICD-10-CM | POA: Diagnosis not present

## 2018-05-04 DIAGNOSIS — Z885 Allergy status to narcotic agent status: Secondary | ICD-10-CM | POA: Insufficient documentation

## 2018-05-04 DIAGNOSIS — E1165 Type 2 diabetes mellitus with hyperglycemia: Secondary | ICD-10-CM | POA: Diagnosis present

## 2018-05-04 DIAGNOSIS — F17210 Nicotine dependence, cigarettes, uncomplicated: Secondary | ICD-10-CM | POA: Insufficient documentation

## 2018-05-04 LAB — POCT GLYCOSYLATED HEMOGLOBIN (HGB A1C): HBA1C, POC (CONTROLLED DIABETIC RANGE): 6.2 % (ref 0.0–7.0)

## 2018-05-04 LAB — GLUCOSE, POCT (MANUAL RESULT ENTRY): POC GLUCOSE: 106 mg/dL — AB (ref 70–99)

## 2018-05-04 MED ORDER — GABAPENTIN 400 MG PO CAPS: ORAL_CAPSULE | ORAL | 3 refills | Status: DC

## 2018-05-04 MED ORDER — IBUPROFEN 800 MG PO TABS: 800.0000 mg | ORAL_TABLET | Freq: Three times a day (TID) | ORAL | 2 refills | Status: DC | PRN

## 2018-05-04 MED ORDER — FAMOTIDINE 20 MG PO TABS: 20.0000 mg | ORAL_TABLET | Freq: Every day | ORAL | 2 refills | Status: DC

## 2018-05-04 MED ORDER — HYDROCHLOROTHIAZIDE 25 MG PO TABS: 25.0000 mg | ORAL_TABLET | Freq: Every day | ORAL | 3 refills | Status: DC

## 2018-05-04 MED FILL — IBUPROFEN 800 MG TABLET: 800 | 20 days supply | Qty: 60 | Fill #0

## 2018-05-04 MED FILL — HYDROCHLOROTHIAZIDE 25 MG T: 25 | 30 days supply | Qty: 30 | Fill #0

## 2018-05-04 MED FILL — GABAPENTIN 400 MG CAPSULE: 400 | 30 days supply | Qty: 180 | Fill #0

## 2018-05-04 MED FILL — FAMOTIDINE 20 MG TABLET: 20 | 30 days supply | Qty: 30 | Fill #0

## 2018-05-04 NOTE — Therapy (Signed)
[facility] [address] [place], [place], [address] Phone: [phone]   Fax:  [phone]  Physical Therapy Treatment  Patient Details  Name: Kendra Griffith MRN: [id number] Date of Birth: 05-11-1965 Referring Provider (PT): Alda Berthold, DO   Encounter Date: 05/04/2018  PT End of Session - 05/04/18 1153    Visit Number  2    Number of Visits  4    Date for PT Re-Evaluation  05/23/18    Authorization Type  3 visits approaved 05/01/18-05/21/18    Authorization - Visit Number  1    Authorization - Number of Visits  3    PT Start Time  9675    PT Stop Time  1100    PT Time Calculation (min)  45 min    Activity Tolerance  Patient tolerated treatment well;Patient limited by pain    Behavior During Therapy  [facility] for tasks assessed/performed       Past Medical History:  Diagnosis Date  . Anemia   . Anxiety   . Blood transfusion without reported diagnosis    childbirth  . Clotting disorder ([place])    childbirth  . Depression   . Diabetes mellitus without complication (HCC)    prediabetes  . Hypertension   . Injury    right side from jumping off bunkbed  . Pre-diabetes     Past Surgical History:  Procedure Laterality Date  . CESAREAN SECTION     three  . cyst removal 1997    . FACIAL RECONSTRUCTION SURGERY    . FOOT SURGERY Bilateral     There were no vitals filed for this visit.  Subjective Assessment - 05/04/18 1132    Subjective  Pt relays she has been having a lot of back cramps/spasms.     Pertinent History  HTN, DM, depression, clotting disorder, anxiety, anemia, chronic pain, alcohol peripheral neuropathy    Limitations  House hold activities;Walking;Standing    Currently in Pain?  Yes    Pain Score  9     Pain Location  Back    Pain Orientation  Lower    Pain Descriptors / Indicators  Spasm    Pain Type  Chronic pain         [facility] PT Assessment - 05/04/18 0001      Assessment   Medical  Diagnosis  low back pain, LE weakness    Referring Provider (PT)  Alda Berthold, DO    Onset Date/Surgical Date  03/26/18      AROM   AROM Assessment Site  Lumbar    Lumbar Flexion  50%    Lumbar Extension  25%    Lumbar - Right Side Bend  50%    Lumbar - Left Side Bend  50%    Lumbar - Right Rotation  50%    Lumbar - Left Rotation  50%      Standardized Balance Assessment   Standardized Balance Assessment  Berg Balance Test;Five Times Sit to Stand    Five times sit to stand comments   20      Berg Balance Test   Sit to Stand  Able to stand without using hands and stabilize independently    Standing Unsupported  Able to stand safely 2 minutes    Sitting with Back Unsupported but Feet Supported on Floor or Stool  Able to sit safely and securely 2 minutes    Stand to Sit  Sits safely with minimal  use of hands    Transfers  Able to transfer safely, minor use of hands    Standing Unsupported with Eyes Closed  Able to stand 10 seconds with supervision    Standing Ubsupported with Feet Together  Able to place feet together independently and stand for 1 minute with supervision    From Standing, Reach Forward with Outstretched Arm  Can reach forward >5 cm safely (2")    From Standing Position, Pick up Object from [place] to pick up shoe, needs supervision    From Standing Position, Turn to Look Behind Over each Shoulder  Looks behind from both sides and weight shifts well    Turn 360 Degrees  Able to turn 360 degrees safely but slowly    Standing Unsupported, Alternately Place Feet on Step/Stool  Able to stand independently and complete 8 steps >20 seconds    Standing Unsupported, One Foot in Front  Able to take small step independently and hold 30 seconds    Standing on One Leg  Able to lift leg independently and hold equal to or more than 3 seconds    Total Score  44       Exercises  Supine Lower Trunk Rotation - 10 reps - 1 sets - 5 sec hold - Supine Piriformis Stretch with Foot  on Ground - 2 sets - 30 hold -   Seated Hamstring Stretch - 3 sets - 30 hold -  Bent Knee Fallouts - 10 reps - 1 set Standing Tandem Balance 30 sec hold  X 1 bilat  [facility] Adult PT Treatment/Exercise - 05/04/18 0001      Modalities   Modalities  Moist Heat      Moist Heat Therapy   Number Minutes Moist Heat  15 Minutes   with therex, pt supine in hooklying   Moist Heat Location  Lumbar Spine      Manual Therapy   Manual Therapy  Soft tissue mobilization    Soft tissue mobilization  lumbar P.S, pt in Rt SL, 8 min             PT Education - 05/04/18 1153    Education provided  Yes    Education Details  HEP    Person(s) Educated  Patient    Methods  Explanation;Demonstration;Verbal cues;Handout    Comprehension  Verbalized understanding;Returned demonstration       PT Short Term Goals - 04/23/18 1140      PT SHORT TERM GOAL #1   Title  = LTG        PT Long Term Goals - 04/23/18 1134      PT LONG TERM GOAL #1   Title  Pt will be independent and compliant with initial HEP    Baseline  dependent currently    Time  4    Period  Weeks    Status  New    Target Date  05/23/18      PT LONG TERM GOAL #2   Title  Pt will improve five time sit to stand without UE support to </= 15 seconds     Baseline  not assessed to date    Time  4    Period  Weeks    Status  New    Target Date  05/23/18      PT LONG TERM GOAL #3   Title  Pt will improve gait velocity to >/= 2.62 ft/sec without AD    Baseline  2.1 ft/sec without  AD    Time  4    Period  Weeks    Status  New    Target Date  05/23/18      PT LONG TERM GOAL #4   Title  Pt will improve BERG score by 4 points to indicate decreased falls risk    Baseline  not assessed to date    Time  4    Period  Weeks    Status  New    Target Date  05/23/18      PT LONG TERM GOAL #5   Title  Pt will demonstrate 10 deg increase in pain free ROM to bilat hamstrings    Baseline  60 deg right hamstring, 68 deg left hamstring     Time  4    Period  Weeks    Status  New    Target Date  05/23/18            Plan - 05/04/18 1154    Clinical Impression Statement  Lumbar ROM was asessed and she does have significant defits as well as significant balance defecits on BERG balance assessment, see above. The rest of her session focused on HEP for beginning lumbar/LE stretching, core stab, and one balance exercise that she was able to show good return demonstration with. She was trialed with STM to lumbar P.S. in efforts to reduce pain and spasm in her back.     Rehab Potential  Fair    Clinical Impairments Affecting Rehab Potential  chronicity of pain, co-morbidities    PT Frequency  1x / week    PT Duration  3 weeks    PT Treatment/Interventions  ADLs/Self Care Home Management;Aquatic Therapy;Cryotherapy;Electrical Stimulation;Moist Heat;DME Instruction;Gait training;Stair training;Functional mobility training;Therapeutic activities;Therapeutic exercise;Balance training;Neuromuscular re-education;Patient/family education;Orthotic Fit/Training;Manual techniques;Passive range of motion;Dry needling;Taping;Spinal Manipulations    PT Next Visit Plan  Assess responst to manual therapy, ask about HEP: hamstring and piriformis stretching, LE strengthening, core stabilization, standing balance.  Discuss dry needling as a treatment option    PT Home Exercise Plan  Access Code: [email or web address]     Consulted and Agree with Plan of Care  Patient       Patient will benefit from skilled therapeutic intervention in order to improve the following deficits and impairments:  Abnormal gait, Decreased activity tolerance, Decreased balance, Decreased endurance, Decreased mobility, Decreased range of motion, Decreased strength, Difficulty walking, Increased muscle spasms, Impaired flexibility, Impaired sensation, Pain  Visit Diagnosis: Chronic bilateral low back pain, unspecified whether sciatica present  Muscle weakness  (generalized)  Unsteadiness on feet  Other disturbances of skin sensation  Difficulty in walking, not elsewhere classified  Repeated falls     Problem List Patient Active Problem List   Diagnosis Date Noted  . Alcoholic peripheral neuropathy ([place]) 06/09/2017  . Tobacco use 06/09/2017  . Moderate episode of recurrent major depressive disorder ([place]) 05/04/2016  . Breast cancer screening 01/21/2016  . Chronic pain syndrome 09/25/2014  . Type 2 diabetes mellitus with diabetic polyneuropathy, without long-term current use of insulin ([place]) 06/30/2014  . Peripheral neuropathy 05/09/2013  . Poor dentition 02/01/2013  . Pain in joint, ankle and foot 02/01/2013  . Essential hypertension, benign 02/01/2013  . FOOT PAIN, BILATERAL 06/16/2010  . RECTAL BLEEDING 12/08/2009  . HEMATURIA UNSPECIFIED 12/08/2009  . DEPRESSION 08/20/2009  . ABSCESS, TOOTH 08/20/2009  . HYPERLIPIDEMIA 07/30/2009  . SICKLE-CELL TRAIT 07/30/2009  . ANEMIA-NOS 07/30/2009  . ALCOHOLISM 07/30/2009  . GERD 07/30/2009  . CALLUS, FOOT 07/30/2009  Silvestre Mesi 05/04/2018, 12:00 PM  [facility] [address] [place], [place], [address] Phone: [phone]   Fax:  [phone]  Name: Kendra Griffith MRN: [id number] Date of Birth: 08-Aug-1965

## 2018-05-04 NOTE — Progress Notes (Signed)
Assessment & Plan:  Kendra Griffith was seen today for diabetes and back pain.  Diagnoses and all orders for this visit:  Diabetes mellitus type 2, uncontrolled, with complications ([place]) -     POCT glucose (manual entry) -     POCT glycosylated hemoglobin (Hb A1C) -     Ambulatory referral to Ophthalmology -     Basic metabolic panel Continue blood sugar control as discussed in office today, low carbohydrate diet, and regular physical exercise as tolerated, 150 minutes per week (30 min each day, 5 days per week, or 50 min 3 days per week). Keep blood sugar logs with fasting goal of 90-130 mg/dl, post prandial (after you eat) less than 180.  For Hypoglycemia: BS <60 and Hyperglycemia BS >400; contact the clinic ASAP. Annual eye exams and foot exams are recommended.   Tobacco dependence Kendra Griffith was counseled on the dangers of tobacco use, and was advised to quit. Reviewed strategies to maximize success, including removing cigarettes and smoking materials from environment, stress management and support of family/friends as well as pharmacological alternatives including: Wellbutrin, Chantix, Nicotine patch, Nicotine gum or lozenges. Smoking cessation support: smoking cessation hotline: 1-800-QUIT-NOW.  Smoking cessation classes are also available through [facility]. Call [phone] or visit our website at [email or web address]   A total of 3 minutes was spent on counseling for smoking cessation and Kendra Griffith is not ready to quit.   Essential hypertension, benign Continue all antihypertensives as prescribed.  Remember to bring in your blood pressure log with you for your follow up appointment.  DASH/Mediterranean Diets are healthier choices for HTN.    Type 2 diabetes mellitus with diabetic polyneuropathy, without long-term current use of insulin (HCC) Controlled Continue medications as prescribed.  Continue blood sugar control as discussed in office today, low  carbohydrate diet, and regular physical exercise as tolerated, 150 minutes per week (30 min each day, 5 days per week, or 50 min 3 days per week). Keep blood sugar logs with fasting goal of 90-130 mg/dl, post prandial (after you eat) less than 180.  For Hypoglycemia: BS <60 and Hyperglycemia BS >400; contact the clinic ASAP. Annual eye exams and foot exams are recommended.   Mixed hyperlipidemia INSTRUCTIONS: Work on a low fat, heart healthy diet and participate in regular aerobic exercise program by working out at least 150 minutes per week; 5 days a week-30 minutes per day. Avoid red meat, fried foods. junk foods, sodas, sugary drinks, unhealthy snacking, alcohol and smoking.  Drink at least 48oz of water per day and monitor your carbohydrate intake daily.      Patient has been counseled on age-appropriate routine health concerns for screening and prevention. These are reviewed and up-to-date. Referrals have been placed accordingly. Immunizations are up-to-date or declined.    Subjective:   Chief Complaint  Patient presents with  . Diabetes  . Back Pain   HPI Kendra Griffith 53 y.o. female presents to office today for follow up to DM, HTN, HPL.  Additional past medical history includes depression and anxiety, chronic pain syndrome and history of alcohol abuse.  She is followed by psychiatry for her depression and mood disorder.  She was evaluated by neurology 03/26/2018 for generalized pain.  Per neurology notes there was no evidence of large fiber neuropathy on nerve conduction study/EMG.  They recommended a skin biopsy that would be done through her podiatrist.  Physical therapy was recommended for her chronic low back pain.  She hs been seeing  her therapist and has been stressed with the fact that her 36 year old son who lives in [place] has been missing for 3 months.  She declines to speak with onsite LCSW today. She denies any current thoughts of self harm.  DIABETES MELLITUS TYPE  II Diabetes is well controlled with last A1c of 6.2 down from 6.5 in October 2019.  A1c at its highest was 6.6.  Current diabetes medication: Metformin 500 mg twice daily.  She currently denies any hypoglycemic symptoms.  Overdue for eye exam and referral will be placed today.  She has chronic peripheral neuropathy in the bilateral feet for which she is taking gabapentin 800 mg 3 times daily.  She does not have a meter and therefore does not monitor her blood glucose levels at home.  Weight is up 5 pounds today since last office visit. Lab Results  Component Value Date   HGBA1C 6.2 05/04/2018   Lab Results  Component Value Date   HGBA1C 6.5 (A) 01/31/2018    ESSENTIAL HYPERTENSION Chronic and stable. She endorses medication compliance. Denies chest pain, shortness of breath, palpitations, lightheadedness, dizziness, headaches or BLE edema. Current antihypertensive: Hydrochlorothiazide 25 mg daily. BP Readings from Last 3 Encounters:  05/04/18 117/77  03/26/18 100/78  01/31/18 115/84       HYPERLIPIDEMIA LDL is not controlled with goal less than 70.  Will start on low-dose statin.  She denies any previous myalgias or statin intolerance. Lab Results  Component Value Date   LDLCALC 98 10/05/2016    Review of Systems  Constitutional: Negative for fever, malaise/fatigue and weight loss.  HENT: Negative.  Negative for nosebleeds.   Eyes: Negative.  Negative for blurred vision, double vision and photophobia.  Respiratory: Negative.  Negative for cough and shortness of breath.   Cardiovascular: Negative.  Negative for chest pain, palpitations and leg swelling.  Gastrointestinal: Negative.  Negative for heartburn, nausea and vomiting.  Musculoskeletal: Positive for back pain, joint pain and myalgias.       CPS  Neurological: Positive for sensory change. Negative for dizziness (peripheral neuropathy), focal weakness, seizures and headaches.  Psychiatric/Behavioral: Positive for  depression. Negative for suicidal ideas. The patient is nervous/anxious.     Past Medical History:  Diagnosis Date  . Anemia   . Anxiety   . Blood transfusion without reported diagnosis    childbirth  . Clotting disorder ([place])    childbirth  . Depression   . Diabetes mellitus without complication (HCC)    prediabetes  . Hypertension   . Injury    right side from jumping off bunkbed  . Pre-diabetes     Past Surgical History:  Procedure Laterality Date  . CESAREAN SECTION     three  . cyst removal 1997    . FACIAL RECONSTRUCTION SURGERY    . FOOT SURGERY Bilateral     Family History  Problem Relation Age of Onset  . Heart disease Mother   . Asthma Sister   . Diabetes Sister   . COPD Sister   . Diabetes Sister   . Diabetes Sister   . Breast cancer Neg Hx   . Colon cancer Neg Hx     Social History Reviewed with no changes to be made today.   Outpatient Medications Prior to Visit  Medication Sig Dispense Refill  . acetaminophen-codeine (TYLENOL #3) 300-30 MG tablet Take 1 tablet by mouth every 4 (four) hours as needed. 60 tablet 0  . amitriptyline (ELAVIL) 50 MG tablet Take 1  tablet (50 mg total) by mouth at bedtime. 30 tablet 3  . Biotin (BIOTIN MAXIMUM STRENGTH) 10 MG TABS Take 5,000 mg by mouth daily.     . chlorhexidine (PERIDEX) 0.12 % solution Use as directed 15 mLs in the mouth or throat 2 (two) times daily. 473 mL 0  . famotidine (PEPCID) 20 MG tablet Take 1 tablet (20 mg total) by mouth daily. 60 tablet 3  . gabapentin (NEURONTIN) 400 MG capsule TAKE 2 CAPSULES BY MOUTH 3 TIMES DAILY. 180 capsule 3  . hydrochlorothiazide (HYDRODIURIL) 25 MG tablet Take 1 tablet (25 mg total) by mouth daily. 90 tablet 3  . ibuprofen (ADVIL,MOTRIN) 800 MG tablet Take 1 tablet (800 mg total) by mouth every 8 (eight) hours as needed. 60 tablet 2  . metFORMIN (GLUCOPHAGE-XR) 500 MG 24 hr tablet Take 1 tablet (500 mg total) by mouth 2 (two) times daily. 90 tablet 3   No  facility-administered medications prior to visit.     Allergies  Allergen Reactions  . Amoxicillin   . Darvocet [Propoxyphene N-Acetaminophen]     Makes her jittery  . Hydrocodone-Acetaminophen     REACTION: Upset stomach  . Percocet [Oxycodone-Acetaminophen]     Makes her jittery  . Valium [Diazepam] Other (See Comments)    UNKNOWN Makes pt. Feel "out of wack"  . Hydrocodone-Acetaminophen Nausea And Vomiting  . Propoxyphene Nausea And Vomiting       Objective:    BP 117/77   Pulse 76   Temp (!) 97.4 F (36.3 C) (Oral)   Ht 5\' 5"  (1.651 m)   Wt 168 lb 3.2 oz (76.3 kg)   SpO2 99%   BMI 27.99 kg/m  Wt Readings from Last 3 Encounters:  05/04/18 168 lb 3.2 oz (76.3 kg)  03/26/18 163 lb 8 oz (74.2 kg)  01/31/18 163 lb (73.9 kg)    Physical Exam Vitals signs and nursing note reviewed.  Constitutional:      Appearance: She is well-developed.  HENT:     Head: Normocephalic and atraumatic.  Neck:     Musculoskeletal: Normal range of motion.  Cardiovascular:     Rate and Rhythm: Normal rate and regular rhythm.     Heart sounds: Normal heart sounds. No murmur. No friction rub. No gallop.   Pulmonary:     Effort: Pulmonary effort is normal. No tachypnea or respiratory distress.     Breath sounds: Normal breath sounds. No decreased breath sounds, wheezing, rhonchi or rales.  Chest:     Chest wall: No tenderness.  Abdominal:     General: Bowel sounds are normal.     Palpations: Abdomen is soft.  Musculoskeletal: Normal range of motion.     Lumbar back: She exhibits normal range of motion and no tenderness.     Comments: Wearing a back brace today  Skin:    General: Skin is warm and dry.  Neurological:     Mental Status: She is alert and oriented to person, place, and time.     Coordination: Coordination normal.  Psychiatric:        Attention and Perception: Attention normal.        Mood and Affect: Mood is depressed. Affect is tearful.        Speech: Speech  normal.        Behavior: Behavior normal. Behavior is cooperative.        Thought Content: Thought content normal.        Cognition and Memory: Cognition normal.  Judgment: Judgment normal.         Patient has been counseled extensively about nutrition and exercise as well as the importance of adherence with medications and regular follow-up. The patient was given clear instructions to go to ER or return to medical center if symptoms don't improve, worsen or new problems develop. The patient verbalized understanding.   Follow-up: Return in about 6 months (around 11/02/2018).   [name], FNP-BC [facility] and [place], [place]   05/04/2018, 1:50 PM

## 2018-05-04 NOTE — Patient Instructions (Addendum)
Access Code: [email or web address]  URL: [email or web address]  Date: 05/04/2018  Prepared by: Elsie Ra   Exercises  Supine Lower Trunk Rotation - 10 reps - 1 sets - 5 sec hold - 2x daily - 6x weekly  Supine Piriformis Stretch with Foot on Ground - 3 sets - 30 hold - 2x daily - 6x weekly  Seated Hamstring Stretch - 3 sets - 30 hold - 2x daily - 6x weekly  Bent Knee Fallouts - 10 reps - 2-3 sets - 2x daily - 6x weekly  Standing Tandem Balance with Unilateral Counter Support - 3 sets - 30 hold - 2x daily - 6x weekly

## 2018-05-05 LAB — BASIC METABOLIC PANEL
BUN / CREAT RATIO: 11 (ref 9–23)
BUN: 8 mg/dL (ref 6–24)
CALCIUM: 9.7 mg/dL (ref 8.7–10.2)
CO2: 26 mmol/L (ref 20–29)
Chloride: 100 mmol/L (ref 96–106)
Creatinine, Ser: 0.74 mg/dL (ref 0.57–1.00)
GFR, EST AFRICAN AMERICAN: 108 mL/min/{1.73_m2} (ref >59)
GFR, EST NON AFRICAN AMERICAN: 93 mL/min/{1.73_m2} (ref >59)
Glucose: 88 mg/dL (ref 65–99)
POTASSIUM: 3.9 mmol/L (ref 3.5–5.2)
SODIUM: 140 mmol/L (ref 134–144)

## 2018-05-07 ENCOUNTER — Other Ambulatory Visit: Payer: Self-pay | Admitting: Nurse Practitioner

## 2018-05-07 DIAGNOSIS — Z1231 Encounter for screening mammogram for malignant neoplasm of breast: Secondary | ICD-10-CM

## 2018-05-08 ENCOUNTER — Ambulatory Visit: Payer: Medicaid Other | Admitting: Physical Therapy

## 2018-05-15 ENCOUNTER — Ambulatory Visit: Payer: Medicaid Other | Attending: Nurse Practitioner | Admitting: Physical Therapy

## 2018-05-15 DIAGNOSIS — R296 Repeated falls: Secondary | ICD-10-CM

## 2018-05-15 DIAGNOSIS — M545 Low back pain: Secondary | ICD-10-CM | POA: Diagnosis present

## 2018-05-15 DIAGNOSIS — G8929 Other chronic pain: Secondary | ICD-10-CM | POA: Diagnosis present

## 2018-05-15 DIAGNOSIS — R262 Difficulty in walking, not elsewhere classified: Secondary | ICD-10-CM

## 2018-05-15 DIAGNOSIS — R208 Other disturbances of skin sensation: Secondary | ICD-10-CM | POA: Diagnosis present

## 2018-05-15 DIAGNOSIS — M6281 Muscle weakness (generalized): Secondary | ICD-10-CM | POA: Diagnosis present

## 2018-05-15 DIAGNOSIS — R2681 Unsteadiness on feet: Secondary | ICD-10-CM | POA: Diagnosis present

## 2018-05-15 NOTE — Therapy (Signed)
[facility] [address] [place], [place], [address] Phone: [phone]   Fax:  [phone]  Physical Therapy Treatment  Patient Details  Name: Kendra Griffith MRN: [id number] Date of Birth: 25-Jan-1966 Referring Provider (PT): Alda Berthold, DO   Encounter Date: 05/15/2018  PT End of Session - 05/15/18 1220    Visit Number  3    Number of Visits  4    Date for PT Re-Evaluation  05/23/18    Authorization Type  3 visits approaved 05/01/18-05/21/18    Authorization - Visit Number  2    Authorization - Number of Visits  3    PT Start Time  4235    PT Stop Time  1228    PT Time Calculation (min)  46 min    Activity Tolerance  Patient tolerated treatment well    Behavior During Therapy  --   became tearful at end of session for no apparent reason      Past Medical History:  Diagnosis Date  . Anemia   . Anxiety   . Blood transfusion without reported diagnosis    childbirth  . Clotting disorder ([place])    childbirth  . Depression   . Diabetes mellitus without complication (HCC)    prediabetes  . Hypertension   . Injury    right side from jumping off bunkbed  . Pre-diabetes     Past Surgical History:  Procedure Laterality Date  . CESAREAN SECTION     three  . cyst removal 1997    . FACIAL RECONSTRUCTION SURGERY    . FOOT SURGERY Bilateral     There were no vitals filed for this visit.  Subjective Assessment - 05/15/18 1149    Subjective  Pt relays back has been a little better and she has been going for more walks. She also relays some pain in the outside of her Lt leg but she does not know why or when that started. She also relays some clicking in her back.     Pertinent History  HTN, DM, depression, clotting disorder, anxiety, anemia, chronic pain, alcohol peripheral neuropathy    Limitations  House hold activities;Walking;Standing    Currently in Pain?  Yes    Pain Score  5     Pain Location  Back    Pain Orientation  Lower    Pain Descriptors / Indicators  Spasm    Pain Type  Chronic pain                       [facility] Adult PT Treatment/Exercise - 05/15/18 0001      Neuro Re-ed    Neuro Re-ed Details   balance training, tandem balance, balance on airex pad feet together eyes open and closed, standing marching without UE support,       Exercises   Exercises  Lumbar      Lumbar Exercises: Stretches   Active Hamstring Stretch  Right;Left;2 reps;30 seconds    Active Hamstring Stretch Limitations  seated    Lower Trunk Rotation Limitations  5 sec X 10 bilat    ITB Stretch Limitations  Lt 3 reps 30 sec supine with strap due to new report of lateral leg pain    Piriformis Stretch  Right;Left;2 reps;30 seconds      Lumbar Exercises: Seated   Sit to Stand  10 reps    Sit to Stand Limitations  no UE  Lumbar Exercises: Supine   Bent Knee Raise  10 reps    Other Supine Lumbar Exercises  bent knee fall outs X 10 bilat with core activation      Modalities   Modalities  Cryotherapy      Cryotherapy   Number Minutes Cryotherapy  8 Minutes    Cryotherapy Location  Lumbar Spine    Type of Cryotherapy  Ice pack             PT Education - 05/15/18 1220    Education Details  HEP review    Person(s) Educated  Patient    Methods  Explanation;Demonstration;Handout;Verbal cues    Comprehension  Verbalized understanding;Returned demonstration       PT Short Term Goals - 04/23/18 1140      PT SHORT TERM GOAL #1   Title  = LTG        PT Long Term Goals - 04/23/18 1134      PT LONG TERM GOAL #1   Title  Pt will be independent and compliant with initial HEP    Baseline  dependent currently    Time  4    Period  Weeks    Status  New    Target Date  05/23/18      PT LONG TERM GOAL #2   Title  Pt will improve five time sit to stand without UE support to </= 15 seconds     Baseline  not assessed to date    Time  4    Period  Weeks    Status  New     Target Date  05/23/18      PT LONG TERM GOAL #3   Title  Pt will improve gait velocity to >/= 2.62 ft/sec without AD    Baseline  2.1 ft/sec without AD    Time  4    Period  Weeks    Status  New    Target Date  05/23/18      PT LONG TERM GOAL #4   Title  Pt will improve BERG score by 4 points to indicate decreased falls risk    Baseline  not assessed to date    Time  4    Period  Weeks    Status  New    Target Date  05/23/18      PT LONG TERM GOAL #5   Title  Pt will demonstrate 10 deg increase in pain free ROM to bilat hamstrings    Baseline  60 deg right hamstring, 68 deg left hamstring    Time  4    Period  Weeks    Status  New    Target Date  05/23/18            Plan - 05/15/18 1221    Clinical Impression Statement  Session focused on lumbar stretching, core strength, and balance today and she showed good tolerance for activity but then for no apparent reason became tearful and needed tissues at the end of session. She declined heat but did want to try ice on her back.  She will need recert next visit as she will be on her last medicaid authorized PT visit.     Rehab Potential  Fair    Clinical Impairments Affecting Rehab Potential  chronicity of pain, co-morbidities    PT Frequency  1x / week    PT Duration  3 weeks    PT Treatment/Interventions  ADLs/Self Care Home Management;Aquatic Therapy;Cryotherapy;Dealer  Stimulation;Moist Heat;DME Instruction;Gait training;Stair training;Functional mobility training;Therapeutic activities;Therapeutic exercise;Balance training;Neuromuscular re-education;Patient/family education;Orthotic Fit/Training;Manual techniques;Passive range of motion;Dry needling;Taping;Spinal Manipulations    PT Next Visit Plan  ask about HEP: hamstring and piriformis stretching, LE strengthening, core stabilization, standing balance.  Discuss dry needling as a treatment option    PT Home Exercise Plan  Access Code: [email or web address]     Consulted and Agree  with Plan of Care  Patient       Patient will benefit from skilled therapeutic intervention in order to improve the following deficits and impairments:  Abnormal gait, Decreased activity tolerance, Decreased balance, Decreased endurance, Decreased mobility, Decreased range of motion, Decreased strength, Difficulty walking, Increased muscle spasms, Impaired flexibility, Impaired sensation, Pain  Visit Diagnosis: Chronic bilateral low back pain, unspecified whether sciatica present  Muscle weakness (generalized)  Unsteadiness on feet  Other disturbances of skin sensation  Difficulty in walking, not elsewhere classified  Repeated falls     Problem List Patient Active Problem List   Diagnosis Date Noted  . Alcoholic peripheral neuropathy ([place]) 06/09/2017  . Tobacco use 06/09/2017  . Moderate episode of recurrent major depressive disorder ([place]) 05/04/2016  . Breast cancer screening 01/21/2016  . Chronic pain syndrome 09/25/2014  . Type 2 diabetes mellitus with diabetic polyneuropathy, without long-term current use of insulin ([place]) 06/30/2014  . Peripheral neuropathy 05/09/2013  . Poor dentition 02/01/2013  . Pain in joint, ankle and foot 02/01/2013  . Essential hypertension, benign 02/01/2013  . FOOT PAIN, BILATERAL 06/16/2010  . RECTAL BLEEDING 12/08/2009  . HEMATURIA UNSPECIFIED 12/08/2009  . DEPRESSION 08/20/2009  . ABSCESS, TOOTH 08/20/2009  . Mixed hyperlipidemia 07/30/2009  . SICKLE-CELL TRAIT 07/30/2009  . ANEMIA-NOS 07/30/2009  . ALCOHOLISM 07/30/2009  . GERD 07/30/2009  . CALLUS, FOOT 07/30/2009    Debbe Odea ,PT,DPT 05/15/2018, 12:30 PM  [facility] [address] [place], [place], [address] Phone: [phone]   Fax:  [phone]  Name: Kendra Griffith MRN: [id number] Date of Birth: [birth year]

## 2018-05-16 ENCOUNTER — Ambulatory Visit: Payer: Medicaid Other | Admitting: Physical Therapy

## 2018-05-22 ENCOUNTER — Ambulatory Visit: Payer: Medicaid Other | Admitting: Physical Therapy

## 2018-05-22 DIAGNOSIS — G8929 Other chronic pain: Secondary | ICD-10-CM

## 2018-05-22 DIAGNOSIS — M6281 Muscle weakness (generalized): Secondary | ICD-10-CM

## 2018-05-22 DIAGNOSIS — M545 Low back pain, unspecified: Secondary | ICD-10-CM

## 2018-05-22 DIAGNOSIS — R2681 Unsteadiness on feet: Secondary | ICD-10-CM

## 2018-05-22 DIAGNOSIS — R208 Other disturbances of skin sensation: Secondary | ICD-10-CM

## 2018-05-22 DIAGNOSIS — R296 Repeated falls: Secondary | ICD-10-CM

## 2018-05-22 DIAGNOSIS — R262 Difficulty in walking, not elsewhere classified: Secondary | ICD-10-CM

## 2018-05-22 NOTE — Therapy (Signed)
[facility] [address] [place], [place], [address] Phone: [phone]   Fax:  [phone]  Physical Therapy Treatment and Recertification  Patient Details  Name: Kendra Griffith MRN: [id number] Date of Birth: May 18, 1965 Referring Provider (PT): Alda Berthold, DO   Encounter Date: 05/22/2018  PT End of Session - 05/22/18 1316    Visit Number  4    Number of Visits  8    Date for PT Re-Evaluation  07/03/18    Authorization Type  3 visits approaved 12/30/17-07/27/38, recert done to resubmit on 05/22/18, she wants to wait until 06/05/18 to continue back due to dental work needed    Authorization - Visit Number  3    Authorization - Number of Visits  3    PT Start Time  1234    PT Stop Time  1319    PT Time Calculation (min)  45 min    Activity Tolerance  Patient tolerated treatment well    Behavior During Therapy  [facility] for tasks assessed/performed       Past Medical History:  Diagnosis Date  . Anemia   . Anxiety   . Blood transfusion without reported diagnosis    childbirth  . Clotting disorder ([place])    childbirth  . Depression   . Diabetes mellitus without complication (HCC)    prediabetes  . Hypertension   . Injury    right side from jumping off bunkbed  . Pre-diabetes     Past Surgical History:  Procedure Laterality Date  . CESAREAN SECTION     three  . cyst removal 1997    . FACIAL RECONSTRUCTION SURGERY    . FOOT SURGERY Bilateral     There were no vitals filed for this visit.  Subjective Assessment - 05/22/18 1311    Subjective  Pt relays back pain has been good at times and worse at times. She relays she had to take a lot of pain medicine today before arrival. She wishes to continue PT but wants to wait until the end of the month to start back as she will be having a lot of dental work. done and will have to take antibiotics    Pertinent History  HTN, DM, depression, clotting disorder, anxiety,  anemia, chronic pain, alcohol peripheral neuropathy    Limitations  House hold activities;Walking;Standing    Patient Stated Goals  to improve mobility, strengthening her back and legs, throwing a ball with her grandkids.  To exercise without fear    Currently in Pain?  Yes    Pain Score  6     Pain Location  Back    Pain Orientation  Lower    Pain Descriptors / Indicators  Spasm    Pain Type  Chronic pain    Pain Onset  More than a month ago    Pain Frequency  Intermittent         [facility] PT Assessment - 05/22/18 0001      Assessment   Medical Diagnosis  low back pain, LE weakness    Referring Provider (PT)  Alda Berthold, DO    Onset Date/Surgical Date  03/26/18    Next MD Visit  not sure      AROM   AROM Assessment Site  Lumbar   %available   Lumbar Flexion  50%    Lumbar Extension  25%    Lumbar - Right Side Bend  75%    Lumbar - Left  Side Bend  75%    Lumbar - Right Rotation  50%    Lumbar - Left Rotation  50%      Strength   Overall Strength Comments  all tested in sitting    Right Hip Flexion  4/5    Right Hip ABduction  4+/5    Left Hip Flexion  4/5    Left Hip ABduction  4+/5    Right Knee Flexion  4+/5    Right Knee Extension  4+/5    Left Knee Flexion  4+/5    Left Knee Extension  4+/5    Right Ankle Dorsiflexion  4+/5      Standardized Balance Assessment   Five times sit to stand comments   14      Berg Balance Test   Sit to Stand  Able to stand without using hands and stabilize independently    Standing Unsupported  Able to stand safely 2 minutes    Sitting with Back Unsupported but Feet Supported on Floor or Stool  Able to sit safely and securely 2 minutes    Stand to Sit  Sits safely with minimal use of hands    Transfers  Able to transfer safely, minor use of hands    Standing Unsupported with Eyes Closed  Able to stand 10 seconds with supervision    Standing Ubsupported with Feet Together  Able to place feet together independently and stand 1  minute safely    From Standing, Reach Forward with Outstretched Arm  Can reach confidently >25 cm (10")    From Standing Position, Pick up Object from Floor  Able to pick up shoe, needs supervision    From Standing Position, Turn to Look Behind Over each Shoulder  Looks behind from both sides and weight shifts well    Turn 360 Degrees  Able to turn 360 degrees safely in 4 seconds or less    Standing Unsupported, Alternately Place Feet on Step/Stool  Able to stand independently and safely and complete 8 steps in 20 seconds    Standing Unsupported, One Foot in Front  Able to plae foot ahead of the other independently and hold 30 seconds    Standing on One Leg  Able to lift leg independently and hold 5-10 seconds    Total Score  52                   [facility] Adult PT Treatment/Exercise - 05/22/18 0001      Exercises   Exercises  Lumbar      Lumbar Exercises: Stretches   Active Hamstring Stretch  Right;Left;2 reps;30 seconds    Active Hamstring Stretch Limitations  seated    Lower Trunk Rotation Limitations  5 sec X 10 bilat      Modalities   Modalities  Electrical Stimulation;Moist Heat      Moist Heat Therapy   Number Minutes Moist Heat  10 Minutes    Moist Heat Location  Lumbar Spine      Electrical Stimulation   Electrical Stimulation Location  lumbar    Electrical Stimulation Action  IFC    Electrical Stimulation Parameters  tolerance, pt supine with legs elvated    Electrical Stimulation Goals  Pain               PT Short Term Goals - 04/23/18 1140      PT SHORT TERM GOAL #1   Title  = LTG  PT Long Term Goals - 05/22/18 1537      PT LONG TERM GOAL #1   Title  Pt will be independent and compliant with initial HEP    Baseline  relays compliance    Time  4    Period  Weeks    Status  Achieved      PT LONG TERM GOAL #2   Title  Pt will improve five time sit to stand without UE support to </= 15 seconds     Baseline  14 seconds    Time  4     Period  Weeks    Status  On-going      PT LONG TERM GOAL #3   Title  Pt will improve gait velocity to >/= 2.62 ft/sec without AD    Baseline  from 2.1 to now  2.5 ft/sec no AD    Time  4    Period  Weeks    Status  On-going      PT LONG TERM GOAL #4   Title  Pt will improve BERG score by 4 points to indicate decreased falls risk    Baseline  from 44 to 52    Time  4    Period  Weeks    Status  Achieved      PT LONG TERM GOAL #5   Title  Pt will demonstrate 10 deg increase in pain free ROM to bilat hamstrings    Baseline  60 deg right hamstring, 68 deg left hamstring    Time  4    Period  Weeks    Status  On-going            Plan - 05/22/18 1318    Clinical Impression Statement  Recert peformed today and she has made some small improvments in lumbar ROM, some mod improvements in gait and balance and leg strength, but back pain continues to be up and down and limited by spasms. She has now met 2/5 long term goals. She will continue to benefit from skilled PT to address her defecits. She has requested to not start back PT until after 06/05/18 as she needs to have dental work done and this appears to be the only barrier to progress at this time.     Rehab Potential  Fair    Clinical Impairments Affecting Rehab Potential  chronicity of pain, co-morbidities    PT Frequency  1x / week    PT Duration  4 weeks    PT Treatment/Interventions  ADLs/Self Care Home Management;Aquatic Therapy;Cryotherapy;Electrical Stimulation;Moist Heat;DME Instruction;Gait training;Stair training;Functional mobility training;Therapeutic activities;Therapeutic exercise;Balance training;Neuromuscular re-education;Patient/family education;Orthotic Fit/Training;Manual techniques;Passive range of motion;Dry needling;Taping;Spinal Manipulations    PT Next Visit Plan  ask about HEP: hamstring and piriformis stretching, LE strengthening, core stabilization, standing balance.  Discuss dry needling as a treatment  option    PT Home Exercise Plan  Access Code: [email or web address]     Consulted and Agree with Plan of Care  Patient       Patient will benefit from skilled therapeutic intervention in order to improve the following deficits and impairments:  Abnormal gait, Decreased activity tolerance, Decreased balance, Decreased endurance, Decreased mobility, Decreased range of motion, Decreased strength, Difficulty walking, Increased muscle spasms, Impaired flexibility, Impaired sensation, Pain  Visit Diagnosis: Chronic bilateral low back pain, unspecified whether sciatica present  Muscle weakness (generalized)  Unsteadiness on feet  Other disturbances of skin sensation  Difficulty in walking, not elsewhere classified  Repeated falls  Problem List Patient Active Problem List   Diagnosis Date Noted  . Alcoholic peripheral neuropathy ([place]) 06/09/2017  . Tobacco use 06/09/2017  . Moderate episode of recurrent major depressive disorder ([place]) 05/04/2016  . Breast cancer screening 01/21/2016  . Chronic pain syndrome 09/25/2014  . Type 2 diabetes mellitus with diabetic polyneuropathy, without long-term current use of insulin ([place]) 06/30/2014  . Peripheral neuropathy 05/09/2013  . Poor dentition 02/01/2013  . Pain in joint, ankle and foot 02/01/2013  . Essential hypertension, benign 02/01/2013  . FOOT PAIN, BILATERAL 06/16/2010  . RECTAL BLEEDING 12/08/2009  . HEMATURIA UNSPECIFIED 12/08/2009  . DEPRESSION 08/20/2009  . ABSCESS, TOOTH 08/20/2009  . Mixed hyperlipidemia 07/30/2009  . SICKLE-CELL TRAIT 07/30/2009  . ANEMIA-NOS 07/30/2009  . ALCOHOLISM 07/30/2009  . GERD 07/30/2009  . CALLUS, FOOT 07/30/2009    Silvestre Mesi 05/22/2018, 3:41 PM  [facility] [address] [place], [place], [address] Phone: [phone]   Fax:  [phone]  Name: Kendra Griffith MRN: [id number] Date of Birth: 15-Aug-1965

## 2018-05-31 ENCOUNTER — Ambulatory Visit: Payer: No Typology Code available for payment source | Admitting: Podiatry

## 2018-06-11 ENCOUNTER — Ambulatory Visit
Admission: RE | Admit: 2018-06-11 | Discharge: 2018-06-11 | Disposition: A | Discharge status: Home / Self Care | Payer: Medicaid Other | Source: Ambulatory Visit | Attending: Nurse Practitioner | Admitting: Nurse Practitioner

## 2018-06-11 DIAGNOSIS — Z1231 Encounter for screening mammogram for malignant neoplasm of breast: Secondary | ICD-10-CM

## 2018-06-12 ENCOUNTER — Ambulatory Visit: Payer: Medicaid Other | Admitting: Physical Therapy

## 2018-06-19 ENCOUNTER — Ambulatory Visit: Payer: Medicaid Other | Attending: Nurse Practitioner | Admitting: Physical Therapy

## 2018-06-19 DIAGNOSIS — G8929 Other chronic pain: Secondary | ICD-10-CM | POA: Diagnosis present

## 2018-06-19 DIAGNOSIS — M545 Low back pain: Secondary | ICD-10-CM | POA: Diagnosis not present

## 2018-06-19 DIAGNOSIS — R2681 Unsteadiness on feet: Secondary | ICD-10-CM

## 2018-06-19 DIAGNOSIS — R262 Difficulty in walking, not elsewhere classified: Secondary | ICD-10-CM | POA: Diagnosis present

## 2018-06-19 DIAGNOSIS — M6281 Muscle weakness (generalized): Secondary | ICD-10-CM

## 2018-06-19 DIAGNOSIS — R208 Other disturbances of skin sensation: Secondary | ICD-10-CM | POA: Diagnosis present

## 2018-06-19 NOTE — Therapy (Addendum)
[facility] [address] [place], [place], [address] Phone: [phone]   Fax:  [phone]  Physical Therapy Treatment/Discharge addendum PHYSICAL THERAPY DISCHARGE SUMMARY  Visits from Start of Care: 5  Current functional level related to goals / functional outcomes: See below   Remaining deficits: See below   Education / Equipment: HEP  Plan: Patient agrees to discharge.  Patient goals were not met. Patient is being discharged due to not returning since the last visit.  ?????  Did not return to PT, reason unknown. Elsie Ra, PT, DPT 05/20/19 11:33 AM       Patient Details  Name: Kendra Griffith MRN: [id number] Date of Birth: 01-06-1966 Referring Provider (PT): Alda Berthold, DO   Encounter Date: 06/19/2018  PT End of Session - 06/19/18 1257    Visit Number  5    Number of Visits  8    Date for PT Re-Evaluation  07/03/18    Authorization Type  4 new visits approaved 2/25 to 07/03/18    Authorization - Visit Number  4    Authorization - Number of Visits  7    PT Start Time  1100    PT Stop Time  1145    PT Time Calculation (min)  45 min    Activity Tolerance  Patient tolerated treatment well    Behavior During Therapy  [facility] for tasks assessed/performed       Past Medical History:  Diagnosis Date  . Anemia   . Anxiety   . Blood transfusion without reported diagnosis    childbirth  . Clotting disorder ([place])    childbirth  . Depression   . Diabetes mellitus without complication (HCC)    prediabetes  . Hypertension   . Injury    right side from jumping off bunkbed  . Pre-diabetes     Past Surgical History:  Procedure Laterality Date  . CESAREAN SECTION     three  . cyst removal 1997    . FACIAL RECONSTRUCTION SURGERY    . FOOT SURGERY Bilateral     There were no vitals filed for this visit.  Subjective Assessment - 06/19/18 1258    Subjective  Pt relays dental work she was  supposed to have got rescheduled due to snow day. She is still having a lot of dental pain and back pain today however no pain in her legs just localized to lumbar. She says she has difficulty with activites bending forward like sweeping    Pertinent History  HTN, DM, depression, clotting disorder, anxiety, anemia, chronic pain, alcohol peripheral neuropathy    Limitations  House hold activities;Walking;Standing    Currently in Pain?  Yes    Pain Score  7     Pain Location  Back    Pain Orientation  Lower    Pain Descriptors / Indicators  Aching;Sore    Pain Type  Chronic pain    Pain Onset  More than a month ago    Pain Frequency  Intermittent                       [facility] Adult PT Treatment/Exercise - 06/19/18 0001      Exercises   Exercises  Lumbar      Lumbar Exercises: Stretches   Lower Trunk Rotation Limitations  5 sec X 10 bilat      Lumbar Exercises: Aerobic   Nustep  5 min LE/UE L4 for intervals 30  sec easy, 30 sec fast      Lumbar Exercises: Standing   Other Standing Lumbar Exercises  standing hip hinge at wall X 20      Modalities   Modalities  Electrical Stimulation;Moist Heat      Moist Heat Therapy   Number Minutes Moist Heat  15 Minutes    Moist Heat Location  Lumbar Spine      Electrical Stimulation   Electrical Stimulation Location  lumbar    Electrical Stimulation Action  IFC    Electrical Stimulation Parameters  to tolerance, pt sitting    Electrical Stimulation Goals  Pain      Manual Therapy   Manual therapy comments  supine passive H.S stretching, knee to chest stretching, and LAQ mobs             PT Education - 06/19/18 1303    Education provided  Yes    Education Details  pain science education    Person(s) Educated  Patient    Methods  Explanation    Comprehension  Verbalized understanding       PT Short Term Goals - 04/23/18 1140      PT SHORT TERM GOAL #1   Title  = LTG        PT Long Term Goals - 05/22/18  1537      PT LONG TERM GOAL #1   Title  Pt will be independent and compliant with initial HEP    Baseline  relays compliance    Time  4    Period  Weeks    Status  Achieved      PT LONG TERM GOAL #2   Title  Pt will improve five time sit to stand without UE support to </= 15 seconds     Baseline  14 seconds    Time  4    Period  Weeks    Status  On-going      PT LONG TERM GOAL #3   Title  Pt will improve gait velocity to >/= 2.62 ft/sec without AD    Baseline  from 2.1 to now  2.5 ft/sec no AD    Time  4    Period  Weeks    Status  On-going      PT LONG TERM GOAL #4   Title  Pt will improve BERG score by 4 points to indicate decreased falls risk    Baseline  from 44 to 52    Time  4    Period  Weeks    Status  Achieved      PT LONG TERM GOAL #5   Title  Pt will demonstrate 10 deg increase in pain free ROM to bilat hamstrings    Baseline  60 deg right hamstring, 68 deg left hamstring    Time  4    Period  Weeks    Status  On-going            Plan - 06/19/18 1303    Clinical Impression Statement  Pt was in more pain today and overall appeared more stressed and depressed and cried at times for unknow reasons just sitting comfortably. Session focused on pain science education and then pain control with MHP with TENS, followed by aerobic activity, then lumbar manual therapy and stretching while lastly focusing on lumbar strength with hip hinge exercise. PT will continue to progress her as tolerable.     Rehab Potential  Fair    Clinical Impairments  Affecting Rehab Potential  chronicity of pain, co-morbidities    PT Frequency  1x / week    PT Duration  4 weeks    PT Treatment/Interventions  ADLs/Self Care Home Management;Aquatic Therapy;Cryotherapy;Electrical Stimulation;Moist Heat;DME Instruction;Gait training;Stair training;Functional mobility training;Therapeutic activities;Therapeutic exercise;Balance training;Neuromuscular re-education;Patient/family  education;Orthotic Fit/Training;Manual techniques;Passive range of motion;Dry needling;Taping;Spinal Manipulations    PT Next Visit Plan  ask about HEP: hamstring and piriformis stretching, LE strengthening, core stabilization, standing balance.  Discuss dry needling as a treatment option    PT Home Exercise Plan  Access Code: [phone] , added hip hinge exercise    Consulted and Agree with Plan of Care  Patient       Patient will benefit from skilled therapeutic intervention in order to improve the following deficits and impairments:  Abnormal gait, Decreased activity tolerance, Decreased balance, Decreased endurance, Decreased mobility, Decreased range of motion, Decreased strength, Difficulty walking, Increased muscle spasms, Impaired flexibility, Impaired sensation, Pain  Visit Diagnosis: Chronic bilateral low back pain, unspecified whether sciatica present  Muscle weakness (generalized)  Unsteadiness on feet  Other disturbances of skin sensation  Difficulty in walking, not elsewhere classified     Problem List Patient Active Problem List   Diagnosis Date Noted  . Alcoholic peripheral neuropathy ([place]) 06/09/2017  . Tobacco use 06/09/2017  . Moderate episode of recurrent major depressive disorder ([place]) 05/04/2016  . Breast cancer screening 01/21/2016  . Chronic pain syndrome 09/25/2014  . Type 2 diabetes mellitus with diabetic polyneuropathy, without long-term current use of insulin ([place]) 06/30/2014  . Peripheral neuropathy 05/09/2013  . Poor dentition 02/01/2013  . Pain in joint, ankle and foot 02/01/2013  . Essential hypertension, benign 02/01/2013  . FOOT PAIN, BILATERAL 06/16/2010  . RECTAL BLEEDING 12/08/2009  . HEMATURIA UNSPECIFIED 12/08/2009  . DEPRESSION 08/20/2009  . ABSCESS, TOOTH 08/20/2009  . Mixed hyperlipidemia 07/30/2009  . SICKLE-CELL TRAIT 07/30/2009  . ANEMIA-NOS 07/30/2009  . ALCOHOLISM 07/30/2009  . GERD 07/30/2009  . CALLUS, FOOT 07/30/2009     Silvestre Mesi 06/19/2018, 1:07 PM  [facility] [address] [place], [place], [address] Phone: [phone]   Fax:  [phone]  Name: Kendra Griffith MRN: [id number] Date of Birth: 05-13-1965

## 2018-06-26 ENCOUNTER — Ambulatory Visit: Payer: Medicaid Other | Admitting: Physical Therapy

## 2018-06-28 ENCOUNTER — Ambulatory Visit: Payer: Medicaid Other | Admitting: Physical Therapy

## 2018-07-12 ENCOUNTER — Ambulatory Visit: Payer: No Typology Code available for payment source | Admitting: Podiatry

## 2018-08-01 ENCOUNTER — Telehealth: Payer: Self-pay | Admitting: Podiatry

## 2018-08-01 NOTE — Telephone Encounter (Signed)
Pt states she was to be referred to our office from Dr. Posey Pronto. Transferred to scheduler.

## 2018-08-01 NOTE — Telephone Encounter (Signed)
Patient was last seen on 03/01/18 and was supposed to go have some nerve tests done but never went in for testing. Patient is wanting to know if a new referral can be sent in for her to have the testing done now.

## 2018-08-07 ENCOUNTER — Ambulatory Visit: Payer: No Typology Code available for payment source | Admitting: Sports Medicine

## 2018-09-12 ENCOUNTER — Other Ambulatory Visit: Payer: Self-pay | Admitting: Nurse Practitioner

## 2018-09-12 DIAGNOSIS — G894 Chronic pain syndrome: Secondary | ICD-10-CM

## 2018-09-13 MED ORDER — FAMOTIDINE 20 MG PO TABS: 20.0000 mg | ORAL_TABLET | Freq: Every day | ORAL | 0 refills | Status: DC

## 2018-10-09 ENCOUNTER — Other Ambulatory Visit: Payer: Self-pay

## 2018-10-09 ENCOUNTER — Encounter: Payer: Self-pay | Admitting: Podiatry

## 2018-10-09 ENCOUNTER — Ambulatory Visit: Payer: Medicaid Other | Admitting: Podiatry

## 2018-10-09 VITALS — Temp 97.9°F

## 2018-10-09 DIAGNOSIS — E1142 Type 2 diabetes mellitus with diabetic polyneuropathy: Secondary | ICD-10-CM

## 2018-10-09 DIAGNOSIS — E119 Type 2 diabetes mellitus without complications: Secondary | ICD-10-CM | POA: Diagnosis not present

## 2018-10-09 DIAGNOSIS — M79674 Pain in right toe(s): Secondary | ICD-10-CM | POA: Diagnosis not present

## 2018-10-09 DIAGNOSIS — B351 Tinea unguium: Secondary | ICD-10-CM

## 2018-10-09 DIAGNOSIS — L84 Corns and callosities: Secondary | ICD-10-CM

## 2018-10-09 DIAGNOSIS — M79675 Pain in left toe(s): Secondary | ICD-10-CM | POA: Diagnosis not present

## 2018-10-09 MED FILL — GABAPENTIN 400 MG CAPSULE: 400 | 30 days supply | Qty: 180 | Fill #1

## 2018-10-09 MED FILL — IBUPROFEN 800 MG TABLET: 800 | 20 days supply | Qty: 60 | Fill #1

## 2018-10-09 MED FILL — AMITRIPTYLINE HCL 50 MG TAB: 50 | 30 days supply | Qty: 30 | Fill #1

## 2018-10-09 MED FILL — metFORMIN HCL ER 500 MG TB2: 500 | 30 days supply | Qty: 60 | Fill #0

## 2018-10-09 MED FILL — HYDROCHLOROTHIAZIDE 25 MG T: 25 | 30 days supply | Qty: 30 | Fill #1

## 2018-10-09 MED FILL — HYDROXYZINE PAM 50 MG CAP: 50 | 30 days supply | Qty: 90 | Fill #1

## 2018-10-09 NOTE — Patient Instructions (Addendum)
Corns and Calluses Corns are small areas of thickened skin that occur on the top, sides, or tip of a toe. They contain a cone-shaped core with a point that can press on a nerve below. This causes pain.  Calluses are areas of thickened skin that can occur anywhere on the body, including the hands, fingers, palms, soles of the feet, and heels. Calluses are usually larger than corns. What are the causes? Corns and calluses are caused by rubbing (friction) or pressure, such as from shoes that are too tight or do not fit properly. What increases the risk? Corns are more likely to develop in people who have misshapen toes (toe deformities), such as hammer toes. Calluses can occur with friction to any area of the skin. They are more likely to develop in people who:  Work with their hands.  Wear shoes that fit poorly, are too tight, or are high-heeled.  Have toe deformities. What are the signs or symptoms? Symptoms of a corn or callus include:  A hard growth on the skin.  Pain or tenderness under the skin.  Redness and swelling.  Increased discomfort while wearing tight-fitting shoes, if your feet are affected. If a corn or callus becomes infected, symptoms may include:  Redness and swelling that gets worse.  Pain.  Fluid, blood, or pus draining from the corn or callus. How is this diagnosed? Corns and calluses may be diagnosed based on your symptoms, your medical history, and a physical exam. How is this treated? Treatment for corns and calluses may include:  Removing the cause of the friction or pressure. This may involve: ? Changing your shoes. ? Wearing shoe inserts (orthotics) or other protective layers in your shoes, such as a corn pad. ? Wearing gloves.  Applying medicine to the skin (topical medicine) to help soften skin in the hardened, thickened areas.  Removing layers of dead skin with a file to reduce the size of the corn or callus.  Removing the corn or callus with a  scalpel or laser.  Taking antibiotic medicines, if your corn or callus is infected.  Having surgery, if a toe deformity is the cause. Follow these instructions at home:   Take over-the-counter and prescription medicines only as told by your health care provider.  If you were prescribed an antibiotic, take it as told by your health care provider. Do not stop taking it even if your condition starts to improve.  Wear shoes that fit well. Avoid wearing high-heeled shoes and shoes that are too tight or too loose.  Wear any padding, protective layers, gloves, or orthotics as told by your health care provider.  Soak your hands or feet and then use a file or pumice stone to soften your corn or callus. Do this as told by your health care provider.  Check your corn or callus every day for symptoms of infection. Contact a health care provider if you:  Notice that your symptoms do not improve with treatment.  Have redness or swelling that gets worse.  Notice that your corn or callus becomes painful.  Have fluid, blood, or pus coming from your corn or callus.  Have new symptoms. Summary  Corns are small areas of thickened skin that occur on the top, sides, or tip of a toe.  Calluses are areas of thickened skin that can occur anywhere on the body, including the hands, fingers, palms, and soles of the feet. Calluses are usually larger than corns.  Corns and calluses are caused by  rubbing (friction) or pressure, such as from shoes that are too tight or do not fit properly.  Treatment may include wearing any padding, protective layers, gloves, or orthotics as told by your health care provider. This information is not intended to replace advice given to you by your health care provider. Make sure you discuss any questions you have with your health care provider. Document Released: 01/02/2004 Document Revised: 07/18/2018 Document Reviewed: 02/08/2017 Elsevier Patient Education  [place].  Diabetes Mellitus and [place] care is an important part of your health, especially when you have diabetes. Diabetes may cause you to have problems because of poor blood flow (circulation) to your feet and legs, which can cause your skin to:  Become thinner and drier.  Break more easily.  Heal more slowly.  Peel and crack. You may also have nerve damage (neuropathy) in your legs and feet, causing decreased feeling in them. This means that you may not notice minor injuries to your feet that could lead to more serious problems. Noticing and addressing any potential problems early is the best way to prevent future foot problems. How to care for your feet Foot hygiene  Wash your feet daily with warm water and mild soap. Do not use hot water. Then, pat your feet and the areas between your toes until they are completely dry. Do not soak your feet as this can dry your skin.  Trim your toenails straight across. Do not dig under them or around the cuticle. File the edges of your nails with an emery board or nail file.  Apply a moisturizing lotion or petroleum jelly to the skin on your feet and to dry, brittle toenails. Use lotion that does not contain alcohol and is unscented. Do not apply lotion between your toes. Shoes and socks  Wear clean socks or stockings every day. Make sure they are not too tight. Do not wear knee-high stockings since they may decrease blood flow to your legs.  Wear shoes that fit properly and have enough cushioning. Always look in your shoes before you put them on to be sure there are no objects inside.  To break in new shoes, wear them for just a few hours a day. This prevents injuries on your feet. Wounds, scrapes, corns, and calluses  Check your feet daily for blisters, cuts, bruises, sores, and redness. If you cannot see the bottom of your feet, use a mirror or ask someone for help.  Do not cut corns or calluses or try to remove them with  medicine.  If you find a minor scrape, cut, or break in the skin on your feet, keep it and the skin around it clean and dry. You may clean these areas with mild soap and water. Do not clean the area with peroxide, alcohol, or iodine.  If you have a wound, scrape, corn, or callus on your foot, look at it several times a day to make sure it is healing and not infected. Check for: ? Redness, swelling, or pain. ? Fluid or blood. ? Warmth. ? Pus or a bad smell. General instructions  Do not cross your legs. This may decrease blood flow to your feet.  Do not use heating pads or hot water bottles on your feet. They may burn your skin. If you have lost feeling in your feet or legs, you may not know this is happening until it is too late.  Protect your feet from hot and cold by wearing shoes,  such as at the beach or on hot pavement.  Schedule a complete foot exam at least once a year (annually) or more often if you have foot problems. If you have foot problems, report any cuts, sores, or bruises to your health care provider immediately. Contact a health care provider if:  You have a medical condition that increases your risk of infection and you have any cuts, sores, or bruises on your feet.  You have an injury that is not healing.  You have redness on your legs or feet.  You feel burning or tingling in your legs or feet.  You have pain or cramps in your legs and feet.  Your legs or feet are numb.  Your feet always feel cold.  You have pain around a toenail. Get help right away if:  You have a wound, scrape, corn, or callus on your foot and: ? You have pain, swelling, or redness that gets worse. ? You have fluid or blood coming from the wound, scrape, corn, or callus. ? Your wound, scrape, corn, or callus feels warm to the touch. ? You have pus or a bad smell coming from the wound, scrape, corn, or callus. ? You have a fever. ? You have a red line going up your leg. Summary  Check  your feet every day for cuts, sores, red spots, swelling, and blisters.  Moisturize feet and legs daily.  Wear shoes that fit properly and have enough cushioning.  If you have foot problems, report any cuts, sores, or bruises to your health care provider immediately.  Schedule a complete foot exam at least once a year (annually) or more often if you have foot problems. This information is not intended to replace advice given to you by your health care provider. Make sure you discuss any questions you have with your health care provider. Document Released: 03/25/2000 Document Revised: 05/10/2017 Document Reviewed: 04/29/2016 Elsevier Patient Education  [place].  Diabetic Neuropathy Diabetic neuropathy refers to nerve damage that is caused by diabetes (diabetes mellitus). Over time, people with diabetes can develop nerve damage throughout the body. There are several types of diabetic neuropathy:  Peripheral neuropathy. This is the most common type of diabetic neuropathy. It causes damage to nerves that carry signals between the spinal cord and other parts of the body (peripheral nerves). This usually affects nerves in the feet and legs first, and may eventually affect the hands and arms. The damage affects the ability to sense touch or temperature.  Autonomic neuropathy. This type causes damage to nerves that control involuntary functions (autonomic nerves). These nerves carry signals that control: ? Heartbeat. ? Body temperature. ? Blood pressure. ? Urination. ? Digestion. ? Sweating. ? Sexual function. ? Response to changing blood sugar (glucose) levels.  Focal neuropathy. This type of nerve damage affects one area of the body, such as an arm, a leg, or the face. The injury may involve one nerve or a small group of nerves. Focal neuropathy can be painful and unpredictable, and occurs most often in older adults with diabetes. This often develops suddenly, but usually improves  over time and does not cause long-term problems.  Proximal neuropathy. This type of nerve damage affects the nerves of the thighs, hips, buttocks, or legs. It causes severe pain, weakness, and muscle death (atrophy), usually in the thigh muscles. It is more common among older men and people who have type 2 diabetes. The length of recovery time may vary. What are the causes?  Peripheral, autonomic, and focal neuropathies are caused by diabetes that is not well controlled with treatment. The cause of proximal neuropathy is not known, but it may be caused by inflammation related to uncontrolled blood glucose levels. What are the signs or symptoms? Peripheral neuropathy Peripheral neuropathy develops slowly over time. When the nerves of the feet and legs no longer work, you may experience:  Burning, stabbing, or aching pain in the legs or feet.  Pain or cramping in the legs or feet.  Loss of feeling (numbness) and inability to feel pressure or pain in the feet. This can lead to: ? Thick calluses or sores on areas of constant pressure. ? Ulcers. ? Reduced ability to feel temperature changes.  Foot deformities.  Muscle weakness.  Loss of balance or coordination. Autonomic neuropathy The symptoms of autonomic neuropathy vary depending on which nerves are affected. Symptoms may include:  Problems with digestion, such as: ? Nausea or vomiting. ? Poor appetite. ? Bloating. ? Diarrhea or constipation. ? Trouble swallowing. ? Losing weight without trying to.  Problems with the heart, blood and lungs, such as: ? Dizziness, especially when standing up. ? Fainting. ? Shortness of breath. ? Irregular heartbeat.  Bladder problems, such as: ? Trouble starting or stopping urination. ? Leaking urine. ? Trouble emptying the bladder. ? Urinary tract infections (UTIs).  Problems with other body functions, such as: ? Sweat. You may sweat too much or too little. ? Temperature. You might get hot  easily. Or, you might feel cold more than usual. ? Sexual function. Men may not be able to get or maintain an erection. Women may have vaginal dryness and difficulty with arousal. Focal neuropathy Symptoms affect only one area of the body. Common symptoms include:  Numbness.  Tingling.  Burning pain.  Prickling feeling.  Very sensitive skin.  Weakness.  Inability to move (paralysis).  Muscle twitching.  Muscles getting smaller (wasting).  Poor coordination.  Double or blurred vision. Proximal neuropathy  Sudden, severe pain in the hip, thigh, or buttocks. Pain may spread from the back into the legs (sciatica).  Pain and numbness in the arms and legs.  Tingling.  Loss of bladder control or bowel control.  Weakness and wasting of thigh muscles.  Difficulty getting up from a seated position.  Abdominal swelling.  Unexplained weight loss. How is this diagnosed? Diagnosis usually involves reviewing your medical history and any symptoms you have. Diagnosis varies depending on the type of neuropathy your health care provider suspects. Peripheral neuropathy Your health care provider will check areas that are affected by your nervous system (neurologic exam), such as your reflexes, how you move, and what you can feel. You may have other tests, such as:  Blood tests.  Removal and examination of fluid that surrounds the spinal cord (lumbar puncture).  CT scan.  MRI.  A test to check the nerves that control muscles (electromyogram, EMG).  Tests of how quickly messages pass through your nerves (nerve conduction velocity tests).  Removal of a small piece of nerve to be examined under a microscope (biopsy). Autonomic neuropathy You may have tests, such as:  Tests to measure your blood pressure and heart rate. This may include monitoring you while you are safely secured to an exam table that moves you from a lying position to an upright position (table tilt  test).  Breathing tests to check your lungs.  Tests to check how food moves through the digestive system (gastric emptying tests).  Blood, sweat, or urine  tests.  Ultrasound of your bladder.  Spinal fluid tests. Focal neuropathy This condition may be diagnosed with:  A neurologic exam.  CT scan.  MRI.  EMG.  Nerve conduction velocity tests. Proximal neuropathy There is no test to diagnose this type of neuropathy. You may have tests to rule out other possible causes of this type of neuropathy. Tests may include:  X-rays of your spine and lumbar region.  Lumbar puncture.  MRI. How is this treated? The goal of treatment is to keep nerve damage from getting worse. The most important part of treatment is keeping your blood glucose level and your A1C level within your target range by following your diabetes management plan. Over time, maintaining lower blood glucose levels helps lessen symptoms. In some cases, you may need prescription pain medicine. Follow these instructions at home:  Lifestyle   Do not use any products that contain nicotine or tobacco, such as cigarettes and e-cigarettes. If you need help quitting, ask your health care provider.  Be physically active every day. Include strength training and balance exercises.  Follow a healthy meal plan.  Work with your health care provider to manage your blood pressure. General instructions  Follow your diabetes management plan as directed. ? Check your blood glucose levels as directed by your health care provider. ? Keep your blood glucose in your target range as directed by your health care provider. ? Have your A1C level checked at least two times a year, or as often as told by your health care provider.  Take over the counter and prescription medicines only as told by your health care provider. This includes insulin and diabetes medicine.  Do not drive or use heavy machinery while taking prescription pain  medicines.  Check your skin and feet every day for cuts, bruises, redness, blisters, or sores.  Keep all follow up visits as told by your health care provider. This is important. Contact a health care provider if:  You have burning, stabbing, or aching pain in your legs or feet.  You are unable to feel pressure or pain in your feet.  You develop problems with digestion, such as: ? Nausea. ? Vomiting. ? Bloating. ? Constipation. ? Diarrhea. ? Abdominal pain.  You have difficulty with urination, such as inability: ? To control when you urinate (incontinence). ? To completely empty the bladder (retention).  You have palpitations.  You feel dizzy, weak, or faint when you stand up. Get help right away if:  You cannot urinate.  You have sudden weakness or loss of coordination.  You have trouble speaking.  You have pain or pressure in your chest.  You have an irregular heart beat.  You have sudden inability to move a part of your body. Summary  Diabetic neuropathy refers to nerve damage that is caused by diabetes. It can affect nerves throughout the entire body, causing numbness and pain in the arms, legs, digestive tract, heart, and other body systems.  Keep your blood glucose level and your blood pressure in your target range, as directed by your health care provider. This can help prevent neuropathy from getting worse.  Check your skin and feet every day for cuts, bruises, redness, blisters, or sores.  Do not use any products that contain nicotine or tobacco, such as cigarettes and e-cigarettes. If you need help quitting, ask your health care provider. This information is not intended to replace advice given to you by your health care provider. Make sure you discuss any questions you  have with your health care provider. Document Released: 06/06/2001 Document Revised: 05/10/2017 Document Reviewed: 05/02/2016 Elsevier Patient Education  2020 [employer].

## 2018-10-09 NOTE — H&P (Signed)
HISTORY AND PHYSICAL  Kendra Griffith is a 53 y.o. female patient with CC: painful teeth  No diagnosis found.  Past Medical History:  Diagnosis Date  . Anemia   . Anxiety   . Blood transfusion without reported diagnosis    childbirth  . Clotting disorder ([place])    childbirth  . Depression   . Diabetes mellitus without complication (HCC)    prediabetes  . Hypertension   . Injury    right side from jumping off bunkbed  . Pre-diabetes     No current facility-administered medications for this encounter.    Current Outpatient Medications  Medication Sig Dispense Refill  . acetaminophen-codeine (TYLENOL #3) 300-30 MG tablet Take 1 tablet by mouth every 4 (four) hours as needed. (Patient taking differently: Take 1 tablet by mouth every 4 (four) hours as needed for moderate pain. ) 60 tablet 0  . amitriptyline (ELAVIL) 50 MG tablet Take 1 tablet (50 mg total) by mouth at bedtime. 30 tablet 3  . Biotin 5 MG CAPS Take 5 mg by mouth 3 (three) times daily.    . cholecalciferol (VITAMIN D3) 25 MCG (1000 UT) tablet Take 1,000 Units by mouth daily.    . famotidine (PEPCID) 20 MG tablet Take 1 tablet (20 mg total) by mouth daily. 90 tablet 0  . gabapentin (NEURONTIN) 400 MG capsule TAKE 2 CAPSULES BY MOUTH 3 TIMES DAILY. (Patient taking differently: Take 400 mg by mouth 3 (three) times daily. ) 180 capsule 3  . Ginkgo Biloba 120 MG CAPS Take 120 mg by mouth daily.    . hydrochlorothiazide (HYDRODIURIL) 25 MG tablet Take 1 tablet (25 mg total) by mouth daily. 90 tablet 3  . ibuprofen (ADVIL,MOTRIN) 800 MG tablet Take 1 tablet (800 mg total) by mouth every 8 (eight) hours as needed. (Patient taking differently: Take 800 mg by mouth every 8 (eight) hours as needed for moderate pain. ) 60 tablet 2  . metFORMIN (GLUCOPHAGE-XR) 500 MG 24 hr tablet Take 1 tablet (500 mg total) by mouth 2 (two) times daily. 90 tablet 3  . Multiple Vitamin (MULTIVITAMIN WITH MINERALS) TABS tablet Take 1 tablet by mouth  daily.    . Omega-3 Fatty Acids (OMEGA 3 500) 500 MG CAPS Take 500 mg by mouth daily.    . vitamin B-12 (CYANOCOBALAMIN) 500 MCG tablet Take 500 mcg by mouth daily.    . vitamin C (ASCORBIC ACID) 500 MG tablet Take 500 mg by mouth daily.     Allergies  Allergen Reactions  . Amoxicillin     Pain Did it involve swelling of the face/tongue/throat, SOB, or low BP? No Did it involve sudden or severe rash/hives, skin peeling, or any reaction on the inside of your mouth or nose? No Did you need to seek medical attention at a hospital or doctor's office? Yes When did it last happen?5 years ago If all above answers are "NO", may proceed with cephalosporin use.   [name] [Propoxyphene N-Acetaminophen] Nausea And Vomiting    Makes her jittery  . Hydrocodone-Acetaminophen Nausea And Vomiting    Upset stomach  . Percocet [Oxycodone-Acetaminophen]     Makes her jittery  . Valium [Diazepam] Other (See Comments)    Makes pt. Feel "out of wack"   Active Problems:   * No active hospital problems. *  Vitals: unknown if currently breastfeeding. Lab results:No results found for this or any previous visit (from the past 67 hour(s)). Radiology Results: No results found. General appearance: alert,  cooperative and no distress Head: Normocephalic, without obvious abnormality, atraumatic Eyes: negative Nose: Nares normal. Septum midline. Mucosa normal. No drainage or sinus tenderness. Throat: multiple carious teeth, lesion left soft palate approx 1 cm x 1 cm, no purulence, edema, fluctuance, trismus.  Neck: no adenopathy, supple, symmetrical, trachea midline and thyroid not enlarged, symmetric, no tenderness/mass/nodules Resp: clear to auscultation bilaterally Cardio: regular rate and rhythm, S1, S2 normal, no murmur, click, rub or gallop  Assessment: Non-restorable teeth secondary to dental caries. 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 GA. Day surgery.    Diona Browner 10/09/2018

## 2018-10-10 ENCOUNTER — Other Ambulatory Visit: Payer: Self-pay

## 2018-10-10 ENCOUNTER — Encounter (HOSPITAL_COMMUNITY): Payer: Self-pay | Admitting: *Deleted

## 2018-10-10 NOTE — Progress Notes (Signed)
Pt denies SOB, chest pain, and being under the care of a cardiologist. Pt denies having a stress test and cardiac cath. Pt denies recent labs. Pt made aware to stop taking Aspirin (unless otherwise advised by surgeon), vitamins, Omega fish oil, Biotin, Ginko Biloba and herbal medications. Do not take any NSAIDs ie: Ibuprofen, Advil, Naproxen (Aleve), Motrin, BC and Goody Powder.  Pt made aware to not take Metformin on DOS. Pt stated that she does not check her blood glucose. Pt denies that she and family members tested positive for COVID-19 ( pt scheduled for testing on 10/11/18 and reminded to quarantine).  Pt denies that she and family experienced the following symptoms:  Cough yes/no: No Fever (>100.23F)  yes/no: No Runny nose yes/no: No Sore throat yes/no: No Difficulty breathing/shortness of breath  yes/no: No  Have you or a family member traveled in the last 14 days and where? yes/no: No  Pt reminded that hospital visitation restrictions are in effect and the importance of the restrictions.   Pt verbalized understanding of all pre-op instructions.  PA, Anesthesiology, asked to review pt EKG.

## 2018-10-11 ENCOUNTER — Other Ambulatory Visit (HOSPITAL_COMMUNITY)
Admission: RE | Admit: 2018-10-11 | Discharge: 2018-10-11 | Disposition: A | Discharge status: Home / Self Care | Payer: Medicaid Other | Source: Ambulatory Visit | Attending: Oral Surgery | Admitting: Oral Surgery

## 2018-10-11 DIAGNOSIS — Z1159 Encounter for screening for other viral diseases: Secondary | ICD-10-CM | POA: Diagnosis not present

## 2018-10-11 LAB — SARS CORONAVIRUS 2 (TAT 6-24 HRS): SARS Coronavirus 2: NEGATIVE

## 2018-10-11 NOTE — Progress Notes (Signed)
Anesthesia Chart Review: Kendra Griffith   Case: [id number] Date/Time: 10/15/18 1230   Procedure: DENTAL EXTRACTIONS (Bilateral )   Anesthesia type: General   Pre-op diagnosis: NONRESTORABLE   Location: [facility] OR ROOM 08 / [place] [facility]   Surgeon: Diona Browner, DDS      DISCUSSION: Patient is a 53 year old female scheduled for the above procedure.  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 She has had prior foot surgeries, facial reconstructive surgery, and teeth extractions.   VS: There were no vitals taken for this visit.  BP Readings from Last 3 Encounters:  05/04/18 117/77  03/26/18 100/78  01/31/18 115/84   She is for labs on the day of surgery and for presurgical COVID test on 10/11/18. Further evaluation on the day of surgery by her anesthesia team.    PROVIDERS: [name], NP is listed as PCP ([place])   LABS: For day of procedure. Cr 0.74 and A1c 6.2 on 05/04/18.   IMAGES: CXR 12/10/17: IMPRESSION: Negative chest.   EKG: 12/10/17: Normal sinus rhythm Anterior infarct , age undetermined Abnormal ECG No significant change since last tracing Confirmed by Deno Etienne [phone]) on 12/10/2017 3:13:00 PM   CV: Echo 11/16/09: Study Conclusions  Left ventricle: The cavity size was normal. Systolic function was  normal. The estimated ejection fraction was in the range of 50% to  55%. Wall motion was normal; there were no regional wall motion  abnormalities. Left ventricular diastolic function parameters were  normal.     Past Medical History:  Diagnosis Date  . Anemia   . Anxiety   . Bipolar disorder ([place])   . Blood transfusion without reported diagnosis    childbirth  .  Chronic pain    feet and back  . Clotting disorder ([place])    childbirth  . Dental caries    lesion soft palate  . Depression   . Diabetes mellitus without complication (HCC)    prediabetes  . GERD (gastroesophageal reflux disease)   . Hypertension   . Injury    right side from jumping off bunkbed  . Pre-diabetes   . Sickle cell trait ([place])   . Wears glasses     Past Surgical History:  Procedure Laterality Date  . CESAREAN SECTION     three  . COLONOSCOPY    . cyst removal 1997    . FACIAL RECONSTRUCTION SURGERY    . FOOT SURGERY Bilateral   . TUBAL LIGATION      MEDICATIONS: No current facility-administered medications for this encounter.    Marland Kitchen acetaminophen-codeine (TYLENOL #3) 300-30 MG tablet  . amitriptyline (ELAVIL) 50 MG tablet  . Biotin 5 MG CAPS  . cholecalciferol (VITAMIN D3) 25 MCG (1000 UT) tablet  . famotidine (PEPCID) 20 MG tablet  . gabapentin (NEURONTIN) 400 MG capsule  . Ginkgo Biloba 120 MG CAPS  . hydrochlorothiazide (HYDRODIURIL) 25 MG tablet  . ibuprofen (ADVIL,MOTRIN) 800 MG tablet  . metFORMIN (GLUCOPHAGE-XR) 500 MG 24 hr tablet  . Multiple Vitamin (MULTIVITAMIN WITH MINERALS) TABS tablet  . Omega-3 Fatty Acids (OMEGA 3 500) 500 MG CAPS  . vitamin B-12 (CYANOCOBALAMIN) 500 MCG tablet  . vitamin C (ASCORBIC ACID) 500 MG tablet    Myra Gianotti, PA-C Surgical Short Stay/Anesthesiology [facility] Phone [phone]  [facility] Phone [phone] 10/11/2018 11:09 AM

## 2018-10-11 NOTE — Anesthesia Preprocedure Evaluation (Addendum)
Anesthesia Evaluation  Patient identified by MRN, date of birth, ID band Patient awake    Reviewed: Allergy & Precautions, H&P , NPO status , Patient's Chart, lab work & pertinent test results, reviewed documented beta blocker date and time   Airway Mallampati: II  TM Distance: >3 FB Neck ROM: full    Dental no notable dental hx. (+) Poor Dentition, Chipped, Missing, Loose   Pulmonary neg pulmonary ROS, Current Smoker,    Pulmonary exam normal breath sounds clear to auscultation       Cardiovascular Exercise Tolerance: Good hypertension, negative cardio ROS   Rhythm:regular Rate:Normal  EKG: 12/10/17: Normal sinus rhythm Anterior infarct , age undetermined Abnormal ECG No significant change since last tracing Confirmed by Deno Etienne [phone]) on 12/10/2017 3:13:00 PM   CV: Echo 11/16/09: Study Conclusions  Left ventricle: The cavity size was normal. Systolic function was  normal. The estimated ejection fraction was in the range of 50% to  55%. Wall motion was normal; there were no regional wall motion  abnormalities   Neuro/Psych negative neurological ROS  negative psych ROS   GI/Hepatic negative GI ROS, Neg liver ROS,   Endo/Other  negative endocrine ROSdiabetes  Renal/GU negative Renal ROS  negative genitourinary   Musculoskeletal   Abdominal   Peds  Hematology  (+) Blood dyscrasia, Sickle cell trait ,   Anesthesia Other Findings   Reproductive/Obstetrics negative OB ROS                            Anesthesia Physical Anesthesia Plan  ASA: III  Anesthesia Plan: General   Post-op Pain Management:    Induction:   PONV Risk Score and Plan: 3 and Ondansetron, Treatment may vary due to age or medical condition and Dexamethasone  Airway Management Planned: Oral ETT  Additional Equipment:   Intra-op Plan:   Post-operative Plan:   Informed Consent: I have reviewed the  patients History and Physical, chart, labs and discussed the procedure including the risks, benefits and alternatives for the proposed anesthesia with the patient or authorized representative who has indicated his/her understanding and acceptance.     Dental Advisory Given  Plan Discussed with: CRNA, Anesthesiologist and Surgeon  Anesthesia Plan Comments: (PAT note written 10/11/2018 by Myra Gianotti, PA-C. )       Anesthesia Quick Evaluation

## 2018-10-13 NOTE — Progress Notes (Signed)
Subjective: Kendra Griffith presents with diabetes, neuropathy and cc of painful, discolored, thick toenails and painful calluses which interfere with activities of daily living. Pain is aggravated when wearing enclosed shoe gear. Pain is relieved with periodic professional debridement.  [name], NP is her PCP. Last visit was 05/04/2018.   Current Outpatient Medications:  .  acetaminophen-codeine (TYLENOL #3) 300-30 MG tablet, Take 1 tablet by mouth every 4 (four) hours as needed. (Patient taking differently: Take 1 tablet by mouth every 4 (four) hours as needed for moderate pain. ), Disp: 60 tablet, Rfl: 0 .  amitriptyline (ELAVIL) 50 MG tablet, Take 1 tablet (50 mg total) by mouth at bedtime., Disp: 30 tablet, Rfl: 3 .  Biotin 5 MG CAPS, Take 5 mg by mouth 3 (three) times daily., Disp: , Rfl:  .  cholecalciferol (VITAMIN D3) 25 MCG (1000 UT) tablet, Take 1,000 Units by mouth daily., Disp: , Rfl:  .  famotidine (PEPCID) 20 MG tablet, Take 1 tablet (20 mg total) by mouth daily., Disp: 90 tablet, Rfl: 0 .  gabapentin (NEURONTIN) 400 MG capsule, TAKE 2 CAPSULES BY MOUTH 3 TIMES DAILY. (Patient taking differently: Take 400 mg by mouth 3 (three) times daily. ), Disp: 180 capsule, Rfl: 3 .  Ginkgo Biloba 120 MG CAPS, Take 120 mg by mouth daily., Disp: , Rfl:  .  hydrochlorothiazide (HYDRODIURIL) 25 MG tablet, Take 1 tablet (25 mg total) by mouth daily., Disp: 90 tablet, Rfl: 3 .  ibuprofen (ADVIL,MOTRIN) 800 MG tablet, Take 1 tablet (800 mg total) by mouth every 8 (eight) hours as needed. (Patient taking differently: Take 800 mg by mouth every 8 (eight) hours as needed for moderate pain. ), Disp: 60 tablet, Rfl: 2 .  Multiple Vitamin (MULTIVITAMIN WITH MINERALS) TABS tablet, Take 1 tablet by mouth daily., Disp: , Rfl:  .  Omega-3 Fatty Acids (OMEGA 3 500) 500 MG CAPS, Take 500 mg by mouth daily., Disp: , Rfl:  .  vitamin B-12 (CYANOCOBALAMIN) 500 MCG tablet, Take 500 mcg by mouth daily., Disp:  , Rfl:  .  vitamin C (ASCORBIC ACID) 500 MG tablet, Take 500 mg by mouth daily., Disp: , Rfl:  .  metFORMIN (GLUCOPHAGE-XR) 500 MG 24 hr tablet, Take 1 tablet (500 mg total) by mouth 2 (two) times daily., Disp: 90 tablet, Rfl: 3  Allergies  Allergen Reactions  . Amoxicillin     Pain Did it involve swelling of the face/tongue/throat, SOB, or low BP? No Did it involve sudden or severe rash/hives, skin peeling, or any reaction on the inside of your mouth or nose? No Did you need to seek medical attention at a hospital or doctor's office? Yes When did it last happen?5 years ago If all above answers are "NO", may proceed with cephalosporin use.   [name] [Propoxyphene N-Acetaminophen] Nausea And Vomiting    Makes her jittery  . Hydrocodone-Acetaminophen Nausea And Vomiting    Upset stomach  . Percocet [Oxycodone-Acetaminophen]     Makes her jittery  . Valium [Diazepam] Other (See Comments)    Makes pt. Feel "out of wack"    Objective: Vitals:   10/09/18 1329  Temp: 97.9 F (36.6 C)    Vascular Examination: Capillary refill time <3 seconds x 10 digits.  Dorsalis pedis pulses present b/l.  Posterior tibial pulses present b/l.  Digital hair absent x 10 digits.  Skin temperature gradient WNL b/l.  Dermatological Examination: Skin with normal turgor, texture and tone b/l.  Toenails 1-5 b/l  discolored, thick, dystrophic with subungual debris and pain with palpation to nailbeds due to thickness of nails.  Hyperkeratotic lesions submet head 5 b/l, submet head 2 left, submet head 1 right foot. No erythema, no edema, no drainage, no flocculence noted.   Musculoskeletal: Muscle strength 5/5 to all LE muscle groups.  No pain, crepitus or joint limitation with passive/active ROM.  Neurological: Sensation decreased 2/5right, 3/5 left with 10 gram monofilament.  Vibratory sensation intact b/l.  Assessment: 1. Painful onychomycosis toenails 1-5 b/l 2. Calluses submet  head 5 b/l, submet head 2 left, submet head 1 right foot 3. NIDDM with Diabetic neuropathy  Plan: 1. Continue diabetic foot care principles. Literature dispensed on today. 2. Toenails 1-5 b/l were debrided in length and girth without iatrogenic bleeding. 3. Calluses pared submetatarsal head submet head 5 b/l, submet head 2 left, submet head 1 right foot) utilizing sterile scalpel blade without incident.  4. Patient to continue soft, supportive shoe gear. 5. Patient to report any pedal injuries to medical professional  6. Follow up 3 months.  7. Patient/POA to call should there be a concern in the interim.

## 2018-10-15 ENCOUNTER — Other Ambulatory Visit: Payer: Self-pay

## 2018-10-15 ENCOUNTER — Ambulatory Visit (HOSPITAL_COMMUNITY)
Admission: RE | Admit: 2018-10-15 | Discharge: 2018-10-15 | Disposition: A | Discharge status: Home / Self Care | Payer: Medicaid Other | Attending: Oral Surgery | Admitting: Oral Surgery

## 2018-10-15 ENCOUNTER — Encounter (HOSPITAL_COMMUNITY): Payer: Self-pay | Admitting: *Deleted

## 2018-10-15 ENCOUNTER — Ambulatory Visit (HOSPITAL_COMMUNITY): Payer: Medicaid Other | Admitting: Vascular Surgery

## 2018-10-15 ENCOUNTER — Encounter (HOSPITAL_COMMUNITY)
Admission: RE | Disposition: A | Discharge status: Home / Self Care | Payer: Self-pay | Source: Home / Self Care | Attending: Oral Surgery

## 2018-10-15 DIAGNOSIS — F329 Major depressive disorder, single episode, unspecified: Secondary | ICD-10-CM | POA: Diagnosis not present

## 2018-10-15 DIAGNOSIS — F17200 Nicotine dependence, unspecified, uncomplicated: Secondary | ICD-10-CM | POA: Diagnosis not present

## 2018-10-15 DIAGNOSIS — I10 Essential (primary) hypertension: Secondary | ICD-10-CM | POA: Diagnosis not present

## 2018-10-15 DIAGNOSIS — Z79899 Other long term (current) drug therapy: Secondary | ICD-10-CM | POA: Diagnosis not present

## 2018-10-15 DIAGNOSIS — D573 Sickle-cell trait: Secondary | ICD-10-CM | POA: Insufficient documentation

## 2018-10-15 DIAGNOSIS — K029 Dental caries, unspecified: Secondary | ICD-10-CM | POA: Insufficient documentation

## 2018-10-15 DIAGNOSIS — K1379 Other lesions of oral mucosa: Secondary | ICD-10-CM | POA: Insufficient documentation

## 2018-10-15 DIAGNOSIS — Z7984 Long term (current) use of oral hypoglycemic drugs: Secondary | ICD-10-CM | POA: Insufficient documentation

## 2018-10-15 DIAGNOSIS — K1370 Unspecified lesions of oral mucosa: Secondary | ICD-10-CM | POA: Diagnosis not present

## 2018-10-15 DIAGNOSIS — A630 Anogenital (venereal) warts: Secondary | ICD-10-CM | POA: Diagnosis not present

## 2018-10-15 DIAGNOSIS — E119 Type 2 diabetes mellitus without complications: Secondary | ICD-10-CM | POA: Insufficient documentation

## 2018-10-15 HISTORY — DX: Sickle-cell trait: D57.3

## 2018-10-15 HISTORY — PX: TOOTH EXTRACTION: SHX859

## 2018-10-15 HISTORY — PX: LESION REMOVAL: SHX5196

## 2018-10-15 HISTORY — DX: Presence of spectacles and contact lenses: Z97.3

## 2018-10-15 HISTORY — DX: Gastro-esophageal reflux disease without esophagitis: K21.9

## 2018-10-15 HISTORY — DX: Bipolar disorder, unspecified: F31.9

## 2018-10-15 HISTORY — DX: Dental caries, unspecified: K02.9

## 2018-10-15 HISTORY — DX: Other chronic pain: G89.29

## 2018-10-15 LAB — CBC
HCT: 41.6 % (ref 36.0–46.0)
Hemoglobin: 13.1 g/dL (ref 12.0–15.0)
MCH: 27.6 pg (ref 26.0–34.0)
MCHC: 31.5 g/dL (ref 30.0–36.0)
MCV: 87.8 fL (ref 80.0–100.0)
Platelets: 154 10*3/uL (ref 150–400)
RBC: 4.74 MIL/uL (ref 3.87–5.11)
RDW: 13.5 % (ref 11.5–15.5)
WBC: 6.7 10*3/uL (ref 4.0–10.5)
nRBC: 0 % (ref 0.0–0.2)

## 2018-10-15 LAB — BASIC METABOLIC PANEL
Anion gap: 11 (ref 5–15)
BUN: 18 mg/dL (ref 6–20)
CO2: 24 mmol/L (ref 22–32)
Calcium: 9.5 mg/dL (ref 8.9–10.3)
Chloride: 106 mmol/L (ref 98–111)
Creatinine, Ser: 0.84 mg/dL (ref 0.44–1.00)
GFR calc Af Amer: >60 mL/min (ref >60)
GFR calc non Af Amer: >60 mL/min (ref >60)
Glucose, Bld: 100 mg/dL — ABNORMAL HIGH (ref 70–99)
Potassium: 3.4 mmol/L — ABNORMAL LOW (ref 3.5–5.1)
Sodium: 141 mmol/L (ref 135–145)

## 2018-10-15 LAB — GLUCOSE, CAPILLARY
Glucose-Capillary: 110 mg/dL — ABNORMAL HIGH (ref 70–99)
Glucose-Capillary: 102 mg/dL — ABNORMAL HIGH (ref 70–99)
Glucose-Capillary: 171 mg/dL — ABNORMAL HIGH (ref 70–99)

## 2018-10-15 LAB — POCT PREGNANCY, URINE: Preg Test, Ur: NEGATIVE

## 2018-10-15 SURGERY — DENTAL RESTORATION/EXTRACTIONS
Anesthesia: General | Site: Mouth | Laterality: Left

## 2018-10-15 MED ORDER — FENTANYL CITRATE (PF) 100 MCG/2ML IJ SOLN
25.0000 ug | INTRAMUSCULAR | Status: DC | PRN
  Administered 2018-10-15: 25 ug via INTRAVENOUS

## 2018-10-15 MED ORDER — CLINDAMYCIN PHOSPHATE 900 MG/50ML IV SOLN
INTRAVENOUS | Status: AC
  Filled 2018-10-15: qty 50

## 2018-10-15 MED ORDER — ONDANSETRON HCL 4 MG PO TABS: 4.0000 mg | ORAL_TABLET | Freq: Three times a day (TID) | ORAL | 0 refills | Status: DC | PRN

## 2018-10-15 MED ORDER — OXYCODONE-ACETAMINOPHEN 5-325 MG PO TABS: 1.0000 | ORAL_TABLET | ORAL | 0 refills | Status: DC | PRN

## 2018-10-15 MED ORDER — OXYCODONE HCL 5 MG PO TABS: 5.0000 mg | ORAL_TABLET | Freq: Once | ORAL | Status: DC | PRN

## 2018-10-15 MED ORDER — LACTATED RINGERS IV SOLN
INTRAVENOUS | Status: DC
  Administered 2018-10-15: 11:00:00 via INTRAVENOUS

## 2018-10-15 MED ORDER — SUCCINYLCHOLINE CHLORIDE 200 MG/10ML IV SOSY
PREFILLED_SYRINGE | INTRAVENOUS | Status: DC | PRN
  Administered 2018-10-15: 160 mg via INTRAVENOUS

## 2018-10-15 MED ORDER — FENTANYL CITRATE (PF) 250 MCG/5ML IJ SOLN
INTRAMUSCULAR | Status: AC
  Filled 2018-10-15: qty 5

## 2018-10-15 MED ORDER — ONDANSETRON HCL 4 MG/2ML IJ SOLN
4.0000 mg | Freq: Once | INTRAMUSCULAR | Status: AC | PRN
  Administered 2018-10-15: 4 mg via INTRAVENOUS

## 2018-10-15 MED ORDER — LIDOCAINE 2% (20 MG/ML) 5 ML SYRINGE
INTRAMUSCULAR | Status: DC | PRN
  Administered 2018-10-15: 60 mg via INTRAVENOUS
  Administered 2018-10-15: 40 mg via INTRAVENOUS

## 2018-10-15 MED ORDER — PROPOFOL 10 MG/ML IV BOLUS
INTRAVENOUS | Status: DC | PRN
  Administered 2018-10-15: 130 mg via INTRAVENOUS

## 2018-10-15 MED ORDER — ONDANSETRON HCL 4 MG/2ML IJ SOLN
INTRAMUSCULAR | Status: AC
  Filled 2018-10-15: qty 2

## 2018-10-15 MED ORDER — FENTANYL CITRATE (PF) 100 MCG/2ML IJ SOLN
INTRAMUSCULAR | Status: AC
  Filled 2018-10-15: qty 2

## 2018-10-15 MED ORDER — MEPERIDINE HCL 25 MG/ML IJ SOLN: 6.2500 mg | INTRAMUSCULAR | Status: DC | PRN

## 2018-10-15 MED ORDER — SODIUM CHLORIDE 0.9 % IV SOLN
INTRAVENOUS | Status: AC | PRN
  Administered 2018-10-15: 1000 mL

## 2018-10-15 MED ORDER — LIDOCAINE-EPINEPHRINE 2 %-1:100000 IJ SOLN
INTRAMUSCULAR | Status: DC | PRN
  Administered 2018-10-15: 20 mL

## 2018-10-15 MED ORDER — ACETAMINOPHEN 325 MG PO TABS: 325.0000 mg | ORAL_TABLET | ORAL | Status: DC | PRN

## 2018-10-15 MED ORDER — MIDAZOLAM HCL 2 MG/2ML IJ SOLN
INTRAMUSCULAR | Status: AC
  Filled 2018-10-15: qty 2

## 2018-10-15 MED ORDER — 0.9 % SODIUM CHLORIDE (POUR BTL) OPTIME
TOPICAL | Status: DC | PRN
  Administered 2018-10-15: 1000 mL

## 2018-10-15 MED ORDER — OXYMETAZOLINE HCL 0.05 % NA SOLN
NASAL | Status: AC
  Filled 2018-10-15: qty 30

## 2018-10-15 MED ORDER — CLINDAMYCIN PHOSPHATE 900 MG/50ML IV SOLN
900.0000 mg | INTRAVENOUS | Status: AC
  Administered 2018-10-15: 900 mg via INTRAVENOUS

## 2018-10-15 MED ORDER — ACETAMINOPHEN 160 MG/5ML PO SOLN: 325.0000 mg | ORAL | Status: DC | PRN

## 2018-10-15 MED ORDER — LIDOCAINE 2% (20 MG/ML) 5 ML SYRINGE
INTRAMUSCULAR | Status: AC
  Filled 2018-10-15: qty 15

## 2018-10-15 MED ORDER — OXYCODONE HCL 5 MG/5ML PO SOLN: 5.0000 mg | Freq: Once | ORAL | Status: DC | PRN

## 2018-10-15 MED ORDER — PROPOFOL 10 MG/ML IV BOLUS
INTRAVENOUS | Status: AC
  Filled 2018-10-15: qty 20

## 2018-10-15 MED ORDER — FENTANYL CITRATE (PF) 250 MCG/5ML IJ SOLN
INTRAMUSCULAR | Status: DC | PRN
  Administered 2018-10-15: 50 ug via INTRAVENOUS
  Administered 2018-10-15: 150 ug via INTRAVENOUS
  Administered 2018-10-15 (×3): 25 ug via INTRAVENOUS
  Administered 2018-10-15 (×2): 50 ug via INTRAVENOUS

## 2018-10-15 MED ORDER — MIDAZOLAM HCL 2 MG/2ML IJ SOLN
INTRAMUSCULAR | Status: DC | PRN
  Administered 2018-10-15: 2 mg via INTRAVENOUS

## 2018-10-15 MED ORDER — LIDOCAINE-EPINEPHRINE 2 %-1:100000 IJ SOLN
INTRAMUSCULAR | Status: AC
  Filled 2018-10-15: qty 1

## 2018-10-15 MED ORDER — ONDANSETRON HCL 4 MG/2ML IJ SOLN
INTRAMUSCULAR | Status: DC | PRN
  Administered 2018-10-15: 4 mg via INTRAVENOUS

## 2018-10-15 MED ORDER — SUCCINYLCHOLINE CHLORIDE 200 MG/10ML IV SOSY
PREFILLED_SYRINGE | INTRAVENOUS | Status: AC
  Filled 2018-10-15: qty 10

## 2018-10-15 MED FILL — ONDANSETRON HCL 4 MG TABLET: 4 | 7 days supply | Qty: 20 | Fill #0

## 2018-10-15 SURGICAL SUPPLY — 36 items
BLADE SURG 15 STRL LF DISP TIS (BLADE) ×2 IMPLANT
BLADE SURG 15 STRL SS (BLADE) ×8
BUR CROSS CUT FISSURE 1.6 (BURR) ×3 IMPLANT
BUR CROSS CUT FISSURE 1.6MM (BURR) ×1
BUR EGG ELITE 4.0 (BURR) ×3 IMPLANT
BUR EGG ELITE 4.0MM (BURR) ×1
CANISTER SUCT 3000ML PPV (MISCELLANEOUS) ×4 IMPLANT
COVER SURGICAL LIGHT HANDLE (MISCELLANEOUS) ×4 IMPLANT
COVER WAND RF STERILE (DRAPES) ×4 IMPLANT
DECANTER SPIKE VIAL GLASS SM (MISCELLANEOUS) ×4 IMPLANT
DRAPE U-SHAPE 76X120 STRL (DRAPES) ×4 IMPLANT
ELECT COATED BLADE 2.86 ST (ELECTRODE) ×2 IMPLANT
GAUZE PACKING FOLDED 2  STR (GAUZE/BANDAGES/DRESSINGS) ×2
GAUZE PACKING FOLDED 2 STR (GAUZE/BANDAGES/DRESSINGS) ×2 IMPLANT
GLOVE BIO SURGEON STRL SZ7 (GLOVE) ×2 IMPLANT
GLOVE BIO SURGEON STRL SZ7.5 (GLOVE) ×4 IMPLANT
GLOVE BIOGEL PI IND STRL 7.0 (GLOVE) IMPLANT
GLOVE BIOGEL PI INDICATOR 7.0 (GLOVE) ×2
GOWN STRL REUS W/ TWL LRG LVL3 (GOWN DISPOSABLE) ×2 IMPLANT
GOWN STRL REUS W/ TWL XL LVL3 (GOWN DISPOSABLE) ×2 IMPLANT
GOWN STRL REUS W/TWL LRG LVL3 (GOWN DISPOSABLE) ×4
GOWN STRL REUS W/TWL XL LVL3 (GOWN DISPOSABLE) ×4
IV NS 1000ML (IV SOLUTION) ×4
IV NS 1000ML BAXH (IV SOLUTION) ×2 IMPLANT
KIT BASIN OR (CUSTOM PROCEDURE TRAY) ×4 IMPLANT
KIT TURNOVER KIT B (KITS) ×4 IMPLANT
NEEDLE 22X1 1/2 (OR ONLY) (NEEDLE) ×8 IMPLANT
NS IRRIG 1000ML POUR BTL (IV SOLUTION) ×4 IMPLANT
PAD ARMBOARD 7.5X6 YLW CONV (MISCELLANEOUS) ×4 IMPLANT
PENCIL BUTTON HOLSTER BLD 10FT (ELECTRODE) ×2 IMPLANT
SLEEVE IRRIGATION ELITE 7 (MISCELLANEOUS) ×4 IMPLANT
SUT CHROMIC 3 0 PS 2 (SUTURE) ×4 IMPLANT
SYR CONTROL 10ML LL (SYRINGE) ×4 IMPLANT
TRAY ENT MC OR (CUSTOM PROCEDURE TRAY) ×4 IMPLANT
TUBING IRRIGATION (MISCELLANEOUS) ×4 IMPLANT
YANKAUER SUCT BULB TIP NO VENT (SUCTIONS) ×4 IMPLANT

## 2018-10-15 NOTE — Anesthesia Postprocedure Evaluation (Signed)
Anesthesia Post Note  Patient: Kendra Griffith  Procedure(s) Performed: DENTAL EXTRACTIONS OF TEETH NUMBER TWO, FOUR, FIVE, SIX, SEVEN, EIGHT, NINE, TEN, ELEVEN, TWELVE, THIRTEEN, FOURTEEN, SEVENTEEN, TWENTY-ONE, TWENTY-TWO, TWENTY-THREE, TWENTY-FOUR, TWENTY-FIVE, TWENTY-SIX, TWENTY-SEVEN, TWENTY-EIGHT WITH ALVEOLOPLASTY (Bilateral Mouth) Lesion Removal left soft palate (Left Mouth)     Patient location during evaluation: PACU Anesthesia Type: General Level of consciousness: awake and alert Pain management: pain level controlled Vital Signs Assessment: post-procedure vital signs reviewed and stable Respiratory status: spontaneous breathing, nonlabored ventilation, respiratory function stable and patient connected to nasal cannula oxygen Cardiovascular status: blood pressure returned to baseline and stable Postop Assessment: no apparent nausea or vomiting Anesthetic complications: no    Last Vitals:  Vitals:   10/15/18 1345 10/15/18 1415  BP: (!) 166/111   Pulse: 98 84  Resp: 20 17  Temp: 36.4 C   SpO2: 100% 91%    Last Pain:  Vitals:   10/15/18 1415  PainSc: 6                  [place]

## 2018-10-15 NOTE — H&P (Signed)
H&P documentation  -History and Physical Reviewed  -Patient has been re-examined  -No change in the plan of care  Memori Sammon  

## 2018-10-15 NOTE — Transfer of Care (Signed)
Immediate Anesthesia Transfer of Care Note  Patient: Kendra Griffith  Procedure(s) Performed: DENTAL EXTRACTIONS OF TEETH NUMBER TWO, FOUR, FIVE, SIX, SEVEN, EIGHT, NINE, TEN, ELEVEN, TWELVE, THIRTEEN, FOURTEEN, SEVENTEEN, TWENTY-ONE, TWENTY-TWO, TWENTY-THREE, TWENTY-FOUR, TWENTY-FIVE, TWENTY-SIX, TWENTY-SEVEN, TWENTY-EIGHT WITH ALVEOLOPLASTY (Bilateral Mouth) Lesion Removal left soft palate (Left Mouth)  Patient Location: PACU  Anesthesia Type:General  Level of Consciousness: awake, alert , oriented and patient cooperative  Airway & Oxygen Therapy: Patient Spontanous Breathing  Post-op Assessment: Report given to RN and Post -op Vital signs reviewed and stable  Post vital signs: Reviewed and stable  Last Vitals:  Vitals Value Taken Time  BP 166/111 10/15/18 1346  Temp    Pulse 94 10/15/18 1348  Resp 13 10/15/18 1348  SpO2 100 % 10/15/18 1348  Vitals shown include unvalidated device data.  Last Pain:  Vitals:   10/15/18 1049  PainSc: 0-No pain      Patients Stated Pain Goal: 5 (38/10/17 5102)  Complications: No apparent anesthesia complications

## 2018-10-15 NOTE — Op Note (Signed)
10/15/2018  1:40 PM  PATIENT:  Kendra Griffith  53 y.o. female  PRE-OPERATIVE DIAGNOSIS:  NONRESTORABLE teeth # 2, 4, 5, 6, 7, 8, 9, 10, 11, 12, 13, 14, 17, 21, 22, 23, 24,25, 26, 27, 28, lesion left soft palate  POST-OPERATIVE DIAGNOSIS:  SAME  PROCEDURE:  Procedure(s): DENTAL EXTRACTIONS OF TEETH NUMBER TWO, FOUR, FIVE, SIX, SEVEN, EIGHT, NINE, TEN, ELEVEN, TWELVE, THIRTEEN, FOURTEEN, SEVENTEEN, TWENTY-ONE, TWENTY-TWO, TWENTY-THREE, TWENTY-FOUR, TWENTY-FIVE, TWENTY-SIX, TWENTY-SEVEN, TWENTY-EIGHT WITH ALVEOLOPLASTY Lesion Removal left soft palate  SURGEON:  Surgeon(s): Diona Browner, DDS  ANESTHESIA:   local and general  EBL:  minimal  DRAINS: none   SPECIMEN:  Lesion left soft palate  COUNTS:  YES  PLAN OF CARE: Discharge to home after PACU  PATIENT DISPOSITION:  PACU - hemodynamically stable.   PROCEDURE DETAILS: Dictation # [id number]  Gae Bon, DMD 10/15/2018 1:40 PM

## 2018-10-15 NOTE — Op Note (Signed)
Kendra Griffith, Kendra Griffith MEDICAL RECORD AL:[id number] ACCOUNT [id number] DATE OF BIRTH:Mar 21, 1966 FACILITY: [facility] LOCATION: [facility] PHYSICIAN:Adalynne Steffensmeier M. Jaylina Ramdass, DDS  OPERATIVE REPORT  DATE OF PROCEDURE:  10/15/2018  PREOPERATIVE DIAGNOSIS:   Nonrestorable teeth secondary to dental caries numbers 2, 4, 5, 6, 7, 8, 9, 10, 11, 12, 13, 14, 17, 21, 22, 23, 24, 25, 26, 27, 28 lesions left soft palate. Marland Kitchen  POSTOPERATIVE DIAGNOSIS:  Nonrestorable teeth secondary to dental caries numbers 2, 4, 5, 6, 7, 8, 9, 10, 11, 12, 13, 14, 17, 21, 22, 23, 24, 25, 26, 27, 28 lesions left soft palate.  PROCEDURES:   1.  Extraction of teeth numbers 2, 4, 5, 6, 7, 8, 9, 10, 11, 12, 13, 14, 17, 21, 22, 23, 24, 25, 26, 27, and 28  2.  Removal of lesion, left soft palate.   3.  Alveoplasty right and left maxilla and mandible.  SURGEON:  Diona Browner, DDS  ANESTHESIA:  General nasal intubation.  DESCRIPTION OF PROCEDURE:  The patient was taken to the operating room and placed on the table in supine position.  General anesthesia was administered intravenously and a nasal endotracheal tube was placed and secured.  The eyes were protected and the  patient was draped for surgery.  A timeout was performed.  The posterior pharynx was suctioned and a throat pack was placed.  Lidocaine 2%, 1:100,000 epinephrine was infiltrated in an inferior alveolar block on the right and left sides and buccal and  palatal infiltration in the maxilla and then attention was turned to the left soft palate.  The syringe was used to inject local anesthesia around the lesion, which was approximately 1 cm x 1 cm in diameter, appeared papillary, consistent with verrucous  vulgaris.  The lesion was excised and then the Bovie electrocautery was used to cauterize the tissues.    Then, attention was turned to the left mandible.  A 15 blade was used to make an incision around teeth numbers 21, 2, 3, 4, 5 and 6 in the mandible, crossing the midline in the  buccal and gingival sulcus.  The periosteum was reflected and the teeth were  elevated with a 301 elevator.  A small coronal fragment of tooth #17 was present and this was removed with the rongeurs.  Then, teeth #21 through 26 were elevated with a 301 elevator.  The teeth were removed with the forceps with the exception of tooth  #22 which fractured upon attempted removal.  The periosteum was then reflected and trimmed and then bone was removed around tooth #22 with a Stryker handpiece and a fissure bur under irrigation and then the tooth was removed using the Ash forceps.  The  sockets were curetted.  The alveoplasty was performed using an egg-shaped bur followed by a bone file.  Then, the area was irrigated and closed with 3-0 chromic.  Then, the left maxilla was encountered.  The 15 blade used to make an incision beginning at  tooth #14 carried forward across the midline in the buccal and palatal sulcus with a 15 blade until tooth #7 was encountered.  The periosteum was reflected from around these teeth.  Teeth were elevated with a 301 elevator and removed from the mouth with  a #150 forceps.  Tooth 12 fractured upon attempted removal, necessitating a reflection of a flap in the area and removal of bone circumferentially around the root and the tooth was removed with the rongeurs.  Then, the left maxillary sockets were  curetted.  The periosteum was reflected to expose the alveolar crest and alveoplasty was performed using the egg bur and the bone file.  The area was irrigated and closed with 3-0 chromic.  The bite block and sweetheart retractor were repositioned to the  other side of the mouth.  A 15 blade was used to make an incision around teeth numbers 27, 28 and 26 in the mandible and around teeth numbers 2, 4, 5 and 6 in the maxilla.  The periosteum was reflected.  The teeth were elevated and removed with the  dental forceps.  Teeth numbers 27 and 5 fractured upon attempted removal and therefore  bone was removed from around these teeth and then the teeth were removed with forceps and the sockets were curetted.  The periosteum was reflected to expose the  alveolar crest in the right and the right maxilla and mandible and then the egg-shaped bur was used to perform alveoplasty and then a bone file was used to further smooth the area.  Then, irrigation with normal saline and then closure with 3-0 chromic.   Then, the oral cavity was irrigated and suctioned.  Throat pack was removed.  The patient was left in care of Anesthesia for extubation, was transferred to PACU with plans for discharge home through day surgery.  ESTIMATED BLOOD LOSS:  Minimum.  COMPLICATIONS:  None.  SPECIMENS:  Lesion, left soft palate.  AN/NUANCE  D:10/15/2018 T:10/15/2018 JOB:[id number]

## 2018-10-15 NOTE — Anesthesia Procedure Notes (Signed)
Procedure Name: Intubation Date/Time: 10/15/2018 12:46 PM Performed by: Elayne Snare, CRNA Pre-anesthesia Checklist: Patient identified, Emergency Drugs available, Suction available and Patient being monitored Patient Re-evaluated:Patient Re-evaluated prior to induction Oxygen Delivery Method: Circle System Utilized Preoxygenation: Pre-oxygenation with 100% oxygen Induction Type: IV induction and Rapid sequence Nasal Tubes: Left, Nasal Rae and Magill forceps- large, utilized Tube size: 7.0 mm Number of attempts: 1 Placement Confirmation: ETT inserted through vocal cords under direct vision,  positive ETCO2 and breath sounds checked- equal and bilateral Tube secured with: Tape Dental Injury: Teeth and Oropharynx as per pre-operative assessment

## 2018-10-16 ENCOUNTER — Encounter (HOSPITAL_COMMUNITY): Payer: Self-pay | Admitting: Oral Surgery

## 2018-11-05 ENCOUNTER — Other Ambulatory Visit: Payer: Self-pay

## 2018-11-05 ENCOUNTER — Encounter: Payer: Self-pay | Admitting: Nurse Practitioner

## 2018-11-05 ENCOUNTER — Ambulatory Visit: Payer: Medicaid Other | Attending: Nurse Practitioner | Admitting: Nurse Practitioner

## 2018-11-05 DIAGNOSIS — F319 Bipolar disorder, unspecified: Secondary | ICD-10-CM | POA: Insufficient documentation

## 2018-11-05 DIAGNOSIS — G621 Alcoholic polyneuropathy: Secondary | ICD-10-CM | POA: Diagnosis not present

## 2018-11-05 DIAGNOSIS — E119 Type 2 diabetes mellitus without complications: Secondary | ICD-10-CM | POA: Diagnosis not present

## 2018-11-05 DIAGNOSIS — D229 Melanocytic nevi, unspecified: Secondary | ICD-10-CM

## 2018-11-05 DIAGNOSIS — E118 Type 2 diabetes mellitus with unspecified complications: Secondary | ICD-10-CM

## 2018-11-05 DIAGNOSIS — G894 Chronic pain syndrome: Secondary | ICD-10-CM

## 2018-11-05 DIAGNOSIS — Z833 Family history of diabetes mellitus: Secondary | ICD-10-CM | POA: Diagnosis not present

## 2018-11-05 DIAGNOSIS — F331 Major depressive disorder, recurrent, moderate: Secondary | ICD-10-CM | POA: Diagnosis not present

## 2018-11-05 DIAGNOSIS — K219 Gastro-esophageal reflux disease without esophagitis: Secondary | ICD-10-CM | POA: Diagnosis not present

## 2018-11-05 DIAGNOSIS — D367 Benign neoplasm of other specified sites: Secondary | ICD-10-CM | POA: Insufficient documentation

## 2018-11-05 DIAGNOSIS — Z79899 Other long term (current) drug therapy: Secondary | ICD-10-CM | POA: Insufficient documentation

## 2018-11-05 DIAGNOSIS — F17210 Nicotine dependence, cigarettes, uncomplicated: Secondary | ICD-10-CM | POA: Diagnosis not present

## 2018-11-05 DIAGNOSIS — F1021 Alcohol dependence, in remission: Secondary | ICD-10-CM | POA: Insufficient documentation

## 2018-11-05 MED ORDER — FAMOTIDINE 20 MG PO TABS: 20.0000 mg | ORAL_TABLET | Freq: Every day | ORAL | 0 refills | Status: DC

## 2018-11-05 MED ORDER — ACETAMINOPHEN-CODEINE #3 300-30 MG PO TABS: 1.0000 | ORAL_TABLET | ORAL | 0 refills | Status: AC | PRN

## 2018-11-05 MED ORDER — ATORVASTATIN CALCIUM 20 MG PO TABS: 20.0000 mg | ORAL_TABLET | Freq: Every day | ORAL | 3 refills | Status: DC

## 2018-11-05 MED FILL — ACETAMINOPHEN/COD #3 TABLET: 300-30 | 7 days supply | Qty: 46 | Fill #0

## 2018-11-05 MED FILL — ATORVASTATIN 20 MG TABLET: 20 | 30 days supply | Qty: 30 | Fill #0

## 2018-11-05 NOTE — Progress Notes (Signed)
Virtual Visit via Telephone Note Due to national recommendations of social distancing due to [place] 19, telehealth visit is felt to be most appropriate for this patient at this time.  I discussed the limitations, risks, security and privacy concerns of performing an evaluation and management service by telephone and the availability of in person appointments. I also discussed with the patient that there may be a patient responsible charge related to this service. The patient expressed understanding and agreed to proceed.    I connected with Kendra Griffith on 11/05/18  at  11:10 AM EDT  EDT by telephone and verified that I am speaking with the correct person using two identifiers.   Consent I discussed the limitations, risks, security and privacy concerns of performing an evaluation and management service by telephone and the availability of in person appointments. I also discussed with the patient that there may be a patient responsible charge related to this service. The patient expressed understanding and agreed to proceed.   Location of Patient: Private  Residence   Location of Provider: [place] [facility] [place] participating in Telemedicine visit: Kendra Rankins FNP-BC [place]    History of Present Illness: Telemedicine visit for: Dermatology Referral  Not feeling well today. She had 4 teeth extracted a few weeks ago. Denies any symptoms of infection.   SKIN PROBLEM Patient complains of mole on her left upper arm. Onset 6 years ago. She was referred to a dermatologist 6 years ago however did not follow up with that appointment. Started off red then changed to black and now a grayish color. Also has changed in size.  She denies any tenderness, redness and swelling.     Past Medical History:  Diagnosis Date  . Anemia   . Anxiety   . Bipolar disorder ([place])   . Blood transfusion without reported diagnosis    childbirth  . Chronic  pain    feet and back  . Clotting disorder ([place])    childbirth  . Dental caries    lesion soft palate  . Depression   . Diabetes mellitus without complication (HCC)    prediabetes  . GERD (gastroesophageal reflux disease)   . Hypertension   . Injury    right side from jumping off bunkbed  . Pre-diabetes   . Sickle cell trait ([place])   . Wears glasses     Past Surgical History:  Procedure Laterality Date  . CESAREAN SECTION     three  . COLONOSCOPY    . cyst removal 1997    . FACIAL RECONSTRUCTION SURGERY    . FOOT SURGERY Bilateral   . LESION REMOVAL Left 10/15/2018   Procedure: Lesion Removal left soft palate;  Surgeon: Diona Browner, DDS;  Location: [facility];  Service: Oral Surgery;  Laterality: Left;  . TOOTH EXTRACTION Bilateral 10/15/2018   Procedure: DENTAL EXTRACTIONS OF TEETH NUMBER TWO, FOUR, FIVE, SIX, SEVEN, EIGHT, NINE, TEN, ELEVEN, TWELVE, THIRTEEN, FOURTEEN, SEVENTEEN, TWENTY-ONE, TWENTY-TWO, TWENTY-THREE, TWENTY-FOUR, TWENTY-FIVE, TWENTY-SIX, TWENTY-SEVEN, TWENTY-EIGHT WITH ALVEOLOPLASTY;  Surgeon: Diona Browner, DDS;  Location: [facility];  Service: Oral Surgery;  Laterality: Bilateral;  . TUBAL LIGATION      Family History  Problem Relation Age of Onset  . Heart disease Mother   . Asthma Sister   . Diabetes Sister   . COPD Sister   . Diabetes Sister   . Diabetes Sister   . Breast cancer Neg Hx   . Colon cancer  Neg Hx     Social History   Socioeconomic History  . Marital status: Single    Spouse name: Not on file  . Number of children: 3  . Years of education: 8-9  . Highest education level: Not on file  Occupational History  . Occupation: applying for disability  Social Needs  . Financial resource strain: Not on file  . Food insecurity    Worry: Not on file    Inability: Not on file  . Transportation needs    Medical: Not on file    Non-medical: Not on file  Tobacco Use  . Smoking status: Current Every Day Smoker    Packs/day: 2.00    Years: 29.00     Pack years: 58.00    Types: Cigarettes  . Smokeless tobacco: Never Used  Substance and Sexual Activity  . Alcohol use: No    Alcohol/week: 0.0 standard drinks    Frequency: Never    Comment: Stopped drinking 2013  . Drug use: No  . Sexual activity: Not Currently    Birth control/protection: None  Lifestyle  . Physical activity    Days per week: 0 days    Minutes per session: 0 min  . Stress: Only a little  Relationships  . Social Herbalist on phone: Twice a week    Gets together: Never    Attends religious service: Never    Active member of club or organization: No    Attends meetings of clubs or organizations: Never    Relationship status: Divorced  Other Topics Concern  . Not on file  Social History Narrative   Patient lives alone in an apartment on the first floor.  3 children.  Applying for disability.  Education: 8th or 9th grade.   Currently not working   Trying to go back to school online for [employer]   Six Grandchildren     Observations/Objective: Awake, alert and oriented x 3   Review of Systems  Constitutional: Negative for fever, malaise/fatigue and weight loss.  HENT: Negative.  Negative for nosebleeds.   Eyes: Negative.  Negative for blurred vision, double vision and photophobia.  Respiratory: Negative.  Negative for cough and shortness of breath.   Cardiovascular: Negative.  Negative for chest pain, palpitations and leg swelling.  Gastrointestinal: Positive for heartburn. Negative for nausea and vomiting.  Musculoskeletal: Negative for myalgias.       CHRONIC PAIN  Skin:       SKIN CHANGES  Neurological: Negative.  Negative for dizziness, focal weakness, seizures and headaches.  Psychiatric/Behavioral: Positive for depression and substance abuse (remission). Negative for suicidal ideas.    Assessment and Plan: Diagnoses and all orders for this visit:  Controlled type 2 diabetes mellitus with complication, without long-term current use of  insulin ([name]) -     Basic metabolic panel; Future -     CBC; Future -     Hemoglobin A1c; Future -     Lipid panel; Future -     Microalbumin / creatinine urine ratio; Future -     atorvastatin (LIPITOR) 20 MG tablet; Take 1 tablet (20 mg total) by mouth daily. Continue blood sugar control as discussed in office today, low carbohydrate diet, and regular physical exercise as tolerated, 150 minutes per week (30 min each day, 5 days per week, or 50 min 3 days per week). Keep blood sugar logs with fasting goal of 90-130 mg/dl, post prandial (after you eat) less than 180.  For Hypoglycemia: BS <60 and Hyperglycemia BS >400; contact the clinic ASAP. Annual eye exams and foot exams are recommended. Lab Results  Component Value Date   HGBA1C 6.2 05/04/2018    Chronic pain syndrome -     acetaminophen-codeine (TYLENOL #3) 300-30 MG tablet; Take 1 tablet by mouth every 4 (four) hours as needed for moderate pain or severe pain.  Neuropathy, alcoholic (HCC) Taking gabapentin 400 mg TID  Alcohol dependence in remission [name]) Denies any recent use   Moderate episode of recurrent major depressive disorder ([place]) Continue elavil as prescribed. Denies any thoughts of self harm.  Depression screen [facility] 2/9 11/05/2018 05/04/2018 01/31/2018 08/01/2017 05/17/2017  Decreased Interest 0 3 3 3 2   Down, Depressed, Hopeless 3 3 3 3 2   PHQ - 2 Score 3 6 6 6 4   Altered sleeping 3 3 3 3 2   Tired, decreased energy 3 2 3 3 2   Change in appetite 3 3 3 3 2   Feeling bad or failure about yourself  3 3 3 3 2   Trouble concentrating 3 3 3 3 2   Moving slowly or fidgety/restless 0 0 3 3 2   Suicidal thoughts 0 2 2 3 1   PHQ-9 Score 18 22 26 27 17   Some recent data might be hidden   Gastroesophageal reflux disease, esophagitis presence not specified -     famotidine (PEPCID) 20 MG tablet; Take 1 tablet (20 mg total) by mouth daily. INSTRUCTIONS: Avoid GERD Triggers: acidic, spicy or fried foods, caffeine, coffee, sodas,   alcohol and chocolate.   Atypical Mole Dermatology referral   Follow Up Instructions Return in about 4 weeks (around 12/03/2018) for DM.     I discussed the assessment and treatment plan with the patient. The patient was provided an opportunity to ask questions and all were answered. The patient agreed with the plan and demonstrated an understanding of the instructions.   The patient was advised to call back or seek an in-person evaluation if the symptoms worsen or if the condition fails to improve as anticipated.  I provided 24 minutes of non-face-to-face time during this encounter including median intraservice time, reviewing previous notes, labs, imaging, medications and explaining diagnosis and management.  [name], FNP-BC

## 2018-11-22 MED FILL — ACETAMINOPHEN/COD #3 TABLET: 300-30 | 7 days supply | Qty: 42 | Fill #0

## 2018-11-22 MED FILL — ATORVASTATIN 20 MG TABLET: 20 | 30 days supply | Qty: 30 | Fill #0

## 2018-11-22 MED FILL — HYDROCHLOROTHIAZIDE 25 MG T: 25 | 30 days supply | Qty: 30 | Fill #2

## 2018-11-27 ENCOUNTER — Other Ambulatory Visit: Payer: Medicaid Other

## 2018-11-27 ENCOUNTER — Ambulatory Visit: Payer: Medicaid Other | Admitting: Nurse Practitioner

## 2018-11-27 DIAGNOSIS — E118 Type 2 diabetes mellitus with unspecified complications: Secondary | ICD-10-CM | POA: Diagnosis not present

## 2018-11-28 LAB — LIPID PANEL
Chol/HDL Ratio: 2.8 ratio (ref 0.0–4.4)
Cholesterol, Total: 187 mg/dL (ref 100–199)
HDL: 66 mg/dL (ref >39)
LDL Calculated: 105 mg/dL — ABNORMAL HIGH (ref 0–99)
Triglycerides: 82 mg/dL (ref 0–149)
VLDL Cholesterol Cal: 16 mg/dL (ref 5–40)

## 2018-11-28 LAB — BASIC METABOLIC PANEL
BUN/Creatinine Ratio: 5 — ABNORMAL LOW (ref 9–23)
BUN: 4 mg/dL — ABNORMAL LOW (ref 6–24)
CO2: 25 mmol/L (ref 20–29)
Calcium: 10 mg/dL (ref 8.7–10.2)
Chloride: 101 mmol/L (ref 96–106)
Creatinine, Ser: 0.82 mg/dL (ref 0.57–1.00)
GFR calc Af Amer: 94 mL/min/{1.73_m2} (ref >59)
GFR calc non Af Amer: 82 mL/min/{1.73_m2} (ref >59)
Glucose: 87 mg/dL (ref 65–99)
Potassium: 4 mmol/L (ref 3.5–5.2)
Sodium: 141 mmol/L (ref 134–144)

## 2018-11-28 LAB — CBC
Hematocrit: 38.2 % (ref 34.0–46.6)
Hemoglobin: 12.7 g/dL (ref 11.1–15.9)
MCH: 27.7 pg (ref 26.6–33.0)
MCHC: 33.2 g/dL (ref 31.5–35.7)
MCV: 83 fL (ref 79–97)
Platelets: 198 10*3/uL (ref 150–450)
RBC: 4.58 x10E6/uL (ref 3.77–5.28)
RDW: 13.7 % (ref 11.7–15.4)
WBC: 6.6 10*3/uL (ref 3.4–10.8)

## 2018-11-28 LAB — HEMOGLOBIN A1C
Est. average glucose Bld gHb Est-mCnc: 134 mg/dL
Hgb A1c MFr Bld: 6.3 % — ABNORMAL HIGH (ref 4.8–5.6)

## 2018-11-28 LAB — MICROALBUMIN / CREATININE URINE RATIO
Creatinine, Urine: 14.8 mg/dL
Microalb/Creat Ratio: <20 mg/g creat (ref 0–29)
Microalbumin, Urine: <3 ug/mL

## 2018-12-03 DIAGNOSIS — F603 Borderline personality disorder: Secondary | ICD-10-CM | POA: Diagnosis not present

## 2018-12-09 ENCOUNTER — Other Ambulatory Visit: Payer: Self-pay | Admitting: Nurse Practitioner

## 2018-12-09 ENCOUNTER — Encounter: Payer: Self-pay | Admitting: Nurse Practitioner

## 2018-12-09 DIAGNOSIS — L989 Disorder of the skin and subcutaneous tissue, unspecified: Secondary | ICD-10-CM

## 2018-12-28 ENCOUNTER — Ambulatory Visit: Payer: Medicaid Other | Admitting: Nurse Practitioner

## 2019-01-10 MED FILL — HYDROCHLOROTHIAZIDE 25 MG T: 25 | 90 days supply | Qty: 90 | Fill #3

## 2019-01-11 ENCOUNTER — Ambulatory Visit (INDEPENDENT_AMBULATORY_CARE_PROVIDER_SITE_OTHER): Payer: Medicaid Other

## 2019-01-11 ENCOUNTER — Ambulatory Visit: Payer: Medicaid Other | Admitting: Podiatry

## 2019-01-11 ENCOUNTER — Encounter: Payer: Self-pay | Admitting: Podiatry

## 2019-01-11 ENCOUNTER — Other Ambulatory Visit: Payer: Self-pay

## 2019-01-11 DIAGNOSIS — M19079 Primary osteoarthritis, unspecified ankle and foot: Secondary | ICD-10-CM | POA: Diagnosis not present

## 2019-01-11 DIAGNOSIS — M79674 Pain in right toe(s): Secondary | ICD-10-CM | POA: Diagnosis not present

## 2019-01-11 DIAGNOSIS — M216X1 Other acquired deformities of right foot: Secondary | ICD-10-CM

## 2019-01-11 DIAGNOSIS — B351 Tinea unguium: Secondary | ICD-10-CM

## 2019-01-11 DIAGNOSIS — E1142 Type 2 diabetes mellitus with diabetic polyneuropathy: Secondary | ICD-10-CM

## 2019-01-11 DIAGNOSIS — Q828 Other specified congenital malformations of skin: Secondary | ICD-10-CM

## 2019-01-11 DIAGNOSIS — M7732 Calcaneal spur, left foot: Secondary | ICD-10-CM

## 2019-01-11 DIAGNOSIS — M79675 Pain in left toe(s): Secondary | ICD-10-CM | POA: Diagnosis not present

## 2019-01-11 DIAGNOSIS — M7731 Calcaneal spur, right foot: Secondary | ICD-10-CM

## 2019-01-11 NOTE — Patient Instructions (Signed)
Diabetes Mellitus and Foot Care Foot care is an important part of your health, especially when you have diabetes. Diabetes may cause you to have problems because of poor blood flow (circulation) to your feet and legs, which can cause your skin to:  Become thinner and drier.  Break more easily.  Heal more slowly.  Peel and crack. You may also have nerve damage (neuropathy) in your legs and feet, causing decreased feeling in them. This means that you may not notice minor injuries to your feet that could lead to more serious problems. Noticing and addressing any potential problems early is the best way to prevent future foot problems. How to care for your feet Foot hygiene  Wash your feet daily with warm water and mild soap. Do not use hot water. Then, pat your feet and the areas between your toes until they are completely dry. Do not soak your feet as this can dry your skin.  Trim your toenails straight across. Do not dig under them or around the cuticle. File the edges of your nails with an emery board or nail file.  Apply a moisturizing lotion or petroleum jelly to the skin on your feet and to dry, brittle toenails. Use lotion that does not contain alcohol and is unscented. Do not apply lotion between your toes. Shoes and socks  Wear clean socks or stockings every day. Make sure they are not too tight. Do not wear knee-high stockings since they may decrease blood flow to your legs.  Wear shoes that fit properly and have enough cushioning. Always look in your shoes before you put them on to be sure there are no objects inside.  To break in new shoes, wear them for just a few hours a day. This prevents injuries on your feet. Wounds, scrapes, corns, and calluses  Check your feet daily for blisters, cuts, bruises, sores, and redness. If you cannot see the bottom of your feet, use a mirror or ask someone for help.  Do not cut corns or calluses or try to remove them with medicine.  If you  find a minor scrape, cut, or break in the skin on your feet, keep it and the skin around it clean and dry. You may clean these areas with mild soap and water. Do not clean the area with peroxide, alcohol, or iodine.  If you have a wound, scrape, corn, or callus on your foot, look at it several times a day to make sure it is healing and not infected. Check for: ? Redness, swelling, or pain. ? Fluid or blood. ? Warmth. ? Pus or a bad smell. General instructions  Do not cross your legs. This may decrease blood flow to your feet.  Do not use heating pads or hot water bottles on your feet. They may burn your skin. If you have lost feeling in your feet or legs, you may not know this is happening until it is too late.  Protect your feet from hot and cold by wearing shoes, such as at the beach or on hot pavement.  Schedule a complete foot exam at least once a year (annually) or more often if you have foot problems. If you have foot problems, report any cuts, sores, or bruises to your health care provider immediately. Contact a health care provider if:  You have a medical condition that increases your risk of infection and you have any cuts, sores, or bruises on your feet.  You have an injury that is not   healing.  You have redness on your legs or feet.  You feel burning or tingling in your legs or feet.  You have pain or cramps in your legs and feet.  Your legs or feet are numb.  Your feet always feel cold.  You have pain around a toenail. Get help right away if:  You have a wound, scrape, corn, or callus on your foot and: ? You have pain, swelling, or redness that gets worse. ? You have fluid or blood coming from the wound, scrape, corn, or callus. ? Your wound, scrape, corn, or callus feels warm to the touch. ? You have pus or a bad smell coming from the wound, scrape, corn, or callus. ? You have a fever. ? You have a red line going up your leg. Summary  Check your feet every day  for cuts, sores, red spots, swelling, and blisters.  Moisturize feet and legs daily.  Wear shoes that fit properly and have enough cushioning.  If you have foot problems, report any cuts, sores, or bruises to your health care provider immediately.  Schedule a complete foot exam at least once a year (annually) or more often if you have foot problems. This information is not intended to replace advice given to you by your health care provider. Make sure you discuss any questions you have with your health care provider. Document Released: 03/25/2000 Document Revised: 05/10/2017 Document Reviewed: 04/29/2016 Elsevier Patient Education  2020 Elsevier Inc.   Onychomycosis/Fungal Toenails  WHAT IS IT? An infection that lies within the keratin of your nail plate that is caused by a fungus.  WHY ME? Fungal infections affect all ages, sexes, races, and creeds.  There may be many factors that predispose you to a fungal infection such as age, coexisting medical conditions such as diabetes, or an autoimmune disease; stress, medications, fatigue, genetics, etc.  Bottom line: fungus thrives in a warm, moist environment and your shoes offer such a location.  IS IT CONTAGIOUS? Theoretically, yes.  You do not want to share shoes, nail clippers or files with someone who has fungal toenails.  Walking around barefoot in the same room or sleeping in the same bed is unlikely to transfer the organism.  It is important to realize, however, that fungus can spread easily from one nail to the next on the same foot.  HOW DO WE TREAT THIS?  There are several ways to treat this condition.  Treatment may depend on many factors such as age, medications, pregnancy, liver and kidney conditions, etc.  It is best to ask your doctor which options are available to you.  1. No treatment.   Unlike many other medical concerns, you can live with this condition.  However for many people this can be a painful condition and may lead to  ingrown toenails or a bacterial infection.  It is recommended that you keep the nails cut short to help reduce the amount of fungal nail. 2. Topical treatment.  These range from herbal remedies to prescription strength nail lacquers.  About 40-50% effective, topicals require twice daily application for approximately 9 to 12 months or until an entirely new nail has grown out.  The most effective topicals are medical grade medications available through physicians offices. 3. Oral antifungal medications.  With an 80-90% cure rate, the most common oral medication requires 3 to 4 months of therapy and stays in your system for a year as the new nail grows out.  Oral antifungal medications do require   blood work to make sure it is a safe drug for you.  A liver function panel will be performed prior to starting the medication and after the first month of treatment.  It is important to have the blood work performed to avoid any harmful side effects.  In general, this medication safe but blood work is required. 4. Laser Therapy.  This treatment is performed by applying a specialized laser to the affected nail plate.  This therapy is noninvasive, fast, and non-painful.  It is not covered by insurance and is therefore, out of pocket.  The results have been very good with a 80-95% cure rate.  [facility] is the only practice in the area to offer this therapy. 5. Permanent Nail Avulsion.  Removing the entire nail so that a new nail will not grow back. 

## 2019-01-13 NOTE — Progress Notes (Signed)
Subjective: Kendra Griffith is seen today for follow up painful, elongated, thickened toenails 1-5 b/l feet that she cannot cut. Pain interferes with daily activities. Aggravating factor includes wearing enclosed shoe gear and relieved with periodic debridement.  She c/o ankle pain b/l for several months as she forgot to mention on her last visit. Patient states she has pain with first steps in am. Ankles feel tight/stiff. She denies any attempt at treatment.   Current Outpatient Medications on File Prior to Visit  Medication Sig  . amitriptyline (ELAVIL) 50 MG tablet Take 1 tablet (50 mg total) by mouth at bedtime.  Marland Kitchen atorvastatin (LIPITOR) 20 MG tablet Take 1 tablet (20 mg total) by mouth daily.  . Biotin 5 MG CAPS Take 5 mg by mouth 3 (three) times daily.  . cholecalciferol (VITAMIN D3) 25 MCG (1000 UT) tablet Take 1,000 Units by mouth daily.  . famotidine (PEPCID) 20 MG tablet Take 1 tablet (20 mg total) by mouth daily.  Marland Kitchen gabapentin (NEURONTIN) 400 MG capsule TAKE 2 CAPSULES BY MOUTH 3 TIMES DAILY. (Patient taking differently: Take 400 mg by mouth 3 (three) times daily. )  . Ginkgo Biloba 120 MG CAPS Take 120 mg by mouth daily.  . hydrochlorothiazide (HYDRODIURIL) 25 MG tablet Take 1 tablet (25 mg total) by mouth daily.  Marland Kitchen ibuprofen (ADVIL,MOTRIN) 800 MG tablet Take 1 tablet (800 mg total) by mouth every 8 (eight) hours as needed. (Patient not taking: Reported on 11/05/2018)  . metFORMIN (GLUCOPHAGE-XR) 500 MG 24 hr tablet Take 1 tablet (500 mg total) by mouth 2 (two) times daily.  . Multiple Vitamin (MULTI-VITAMIN) tablet Take by mouth.  . Multiple Vitamin (MULTIVITAMIN WITH MINERALS) TABS tablet Take 1 tablet by mouth daily.  . Omega-3 Fatty Acids (OMEGA 3 500) 500 MG CAPS Take 500 mg by mouth daily.  . ondansetron (ZOFRAN) 4 MG tablet Take 1 tablet (4 mg total) by mouth every 8 (eight) hours as needed for nausea or vomiting.  . vitamin B-12 (CYANOCOBALAMIN) 500 MCG tablet Take 500 mcg by  mouth daily.  . vitamin C (ASCORBIC ACID) 500 MG tablet Take 500 mg by mouth daily.   No current facility-administered medications on file prior to visit.      Allergies  Allergen Reactions  . Amoxicillin     Pain Did it involve swelling of the face/tongue/throat, SOB, or low BP? No Did it involve sudden or severe rash/hives, skin peeling, or any reaction on the inside of your mouth or nose? No Did you need to seek medical attention at a hospital or doctor's office? Yes When did it last happen?5 years ago If all above answers are "NO", may proceed with cephalosporin use.   [name] [Propoxyphene N-Acetaminophen] Nausea And Vomiting    Makes her jittery  . Hydrocodone-Acetaminophen Nausea And Vomiting    Upset stomach  . Percocet [Oxycodone-Acetaminophen]     Makes her jittery  . Valium [Diazepam] Other (See Comments)    Makes pt. Feel "out of wack"   Objective:  Vascular Examination: Capillary refill time <3 seconds x 10 digits.  Dorsalis pedis present b/l.  Posterior tibial pulses present b/l.  Digital hair present x 10 digits.  Skin temperature gradient WNL b/l.   Dermatological Examination: Skin with normal turgor, texture and tone b/l.  Toenails 1-5 b/l discolored, thick, dystrophic with subungual debris and pain with palpation to nailbeds due to thickness of nails.  Porokeratotic lesions submet head 5 b/l, submet head 2 left foot, submet head  1 right foot with tenderness to palpation. No erythema, no edema, no drainage, no flocculence.  Musculoskeletal: Muscle strength 5/5 to all LE muscle groups b/l.  No gross bony deformities b/l.  She has some mild equinus noted b/l.   No pain, crepitus or joint limitation noted with ROM.   Neurological Examination: Protective sensation decreased with 10 gram monofilament bilaterally.  Vibratory sensation intact bilaterally.   Xrays: +Evidence of early posterior calcaneal spurring b/l. No evidence of DJD  b/l. No gas in tissues  Assessment: Painful onychomycosis toenails 1-5 b/l  Porokeratoses submet head 5 b/l, submet head 2 left foot, submet head 1 right foot  NIDDM with neuropathy  Plan: 1. Toenails 1-5 b/l were debrided in length and girth without iatrogenic bleeding.  2. Xrays b/l ankles taken and reviewed with Ms. Losee. 3. Patient to continue soft, supportive shoe gear. 4. Prescribed Achilles stretches bid.   5. Patient to report any pedal injuries to medical professional immediately. 6. Follow up 3 months.  7. Patient/POA to call should there be a concern in the interim.

## 2019-01-18 ENCOUNTER — Other Ambulatory Visit: Payer: Self-pay

## 2019-01-18 ENCOUNTER — Encounter: Payer: Self-pay | Admitting: Nurse Practitioner

## 2019-01-18 ENCOUNTER — Ambulatory Visit: Payer: Medicaid Other | Attending: Nurse Practitioner | Admitting: Nurse Practitioner

## 2019-01-18 VITALS — BP 110/73 | HR 69 | Temp 98.2°F | Ht 65.0 in | Wt 161.0 lb

## 2019-01-18 DIAGNOSIS — I10 Essential (primary) hypertension: Secondary | ICD-10-CM | POA: Diagnosis not present

## 2019-01-18 DIAGNOSIS — Z7984 Long term (current) use of oral hypoglycemic drugs: Secondary | ICD-10-CM | POA: Insufficient documentation

## 2019-01-18 DIAGNOSIS — Z1331 Encounter for screening for depression: Secondary | ICD-10-CM | POA: Diagnosis not present

## 2019-01-18 DIAGNOSIS — E119 Type 2 diabetes mellitus without complications: Secondary | ICD-10-CM | POA: Diagnosis not present

## 2019-01-18 DIAGNOSIS — F17200 Nicotine dependence, unspecified, uncomplicated: Secondary | ICD-10-CM

## 2019-01-18 DIAGNOSIS — F329 Major depressive disorder, single episode, unspecified: Secondary | ICD-10-CM | POA: Diagnosis not present

## 2019-01-18 DIAGNOSIS — G894 Chronic pain syndrome: Secondary | ICD-10-CM | POA: Insufficient documentation

## 2019-01-18 DIAGNOSIS — Z23 Encounter for immunization: Secondary | ICD-10-CM | POA: Insufficient documentation

## 2019-01-18 DIAGNOSIS — F319 Bipolar disorder, unspecified: Secondary | ICD-10-CM | POA: Diagnosis not present

## 2019-01-18 DIAGNOSIS — F419 Anxiety disorder, unspecified: Secondary | ICD-10-CM | POA: Diagnosis not present

## 2019-01-18 DIAGNOSIS — F17210 Nicotine dependence, cigarettes, uncomplicated: Secondary | ICD-10-CM | POA: Diagnosis not present

## 2019-01-18 DIAGNOSIS — K219 Gastro-esophageal reflux disease without esophagitis: Secondary | ICD-10-CM | POA: Insufficient documentation

## 2019-01-18 DIAGNOSIS — D573 Sickle-cell trait: Secondary | ICD-10-CM | POA: Insufficient documentation

## 2019-01-18 DIAGNOSIS — Z79899 Other long term (current) drug therapy: Secondary | ICD-10-CM | POA: Insufficient documentation

## 2019-01-18 DIAGNOSIS — E118 Type 2 diabetes mellitus with unspecified complications: Secondary | ICD-10-CM

## 2019-01-18 LAB — GLUCOSE, POCT (MANUAL RESULT ENTRY): POC Glucose: 101 mg/dl — AB (ref 70–99)

## 2019-01-18 MED ORDER — AMITRIPTYLINE HCL 50 MG PO TABS: 50.0000 mg | ORAL_TABLET | Freq: Every day | ORAL | 3 refills | Status: DC

## 2019-01-18 MED ORDER — NICOTINE 7 MG/24HR TD PT24: 7.0000 mg | MEDICATED_PATCH | Freq: Every day | TRANSDERMAL | 0 refills | Status: AC

## 2019-01-18 MED ORDER — HYDROXYZINE HCL 25 MG PO TABS: 25.0000 mg | ORAL_TABLET | Freq: Three times a day (TID) | ORAL | 1 refills | Status: DC | PRN

## 2019-01-18 MED ORDER — NICOTINE 21 MG/24HR TD PT24: 21.0000 mg | MEDICATED_PATCH | Freq: Every day | TRANSDERMAL | 0 refills | Status: AC

## 2019-01-18 MED ORDER — NICOTINE POLACRILEX 4 MG MT LOZG: 4.0000 mg | LOZENGE | OROMUCOSAL | 0 refills | Status: DC | PRN

## 2019-01-18 MED ORDER — NICOTINE 14 MG/24HR TD PT24: 14.0000 mg | MEDICATED_PATCH | Freq: Every day | TRANSDERMAL | 0 refills | Status: AC

## 2019-01-18 MED FILL — NICOTINE 21 MG/24HR PATCH: 21 | 28 days supply | Qty: 28 | Fill #0

## 2019-01-18 MED FILL — hydrOXYzine HCL 25 MG TABS: 25 | 20 days supply | Qty: 60 | Fill #0

## 2019-01-18 MED FILL — AMITRIPTYLINE HCL 50 MG TAB: 50 | 30 days supply | Qty: 30 | Fill #0

## 2019-01-18 MED FILL — SM NICOTINE 4 MG LOZENGE: 4 | 30 days supply | Qty: 72 | Fill #0

## 2019-01-18 NOTE — Progress Notes (Signed)
Assessment & Plan:  Kendra Griffith was seen today for follow-up.  Diagnoses and all orders for this visit:  Anxiety and depression -     hydrOXYzine (ATARAX/VISTARIL) 25 MG tablet; Take 1 tablet (25 mg total) by mouth 3 (three) times daily as needed. -     amitriptyline (ELAVIL) 50 MG tablet; Take 1 tablet (50 mg total) by mouth at bedtime. -     Ambulatory referral to [facility]  Controlled type 2 diabetes mellitus with complication, without long-term current use of insulin (HCC) -     Glucose (CBG)  Chronic pain syndrome -     amitriptyline (ELAVIL) 50 MG tablet; Take 1 tablet (50 mg total) by mouth at bedtime.  Tobacco dependence -     nicotine (NICODERM CQ - DOSED IN MG/24 HOURS) 21 mg/24hr patch; Place 1 patch (21 mg total) onto the skin daily. -     nicotine (NICODERM CQ - DOSED IN MG/24 HOURS) 14 mg/24hr patch; Place 1 patch (14 mg total) onto the skin daily for 28 days. -     nicotine (NICODERM CQ - DOSED IN MG/24 HR) 7 mg/24hr patch; Place 1 patch (7 mg total) onto the skin daily for 28 days. -     nicotine polacrilex (NICOTINE MINI) 4 MG lozenge; Take 1 lozenge (4 mg total) by mouth as needed for smoking cessation.  Need for prophylactic vaccination with Streptococcus pneumoniae (Pneumococcus) and Influenza vaccines -     Pneumococcal polysaccharide vaccine 23-valent greater than or equal to 2yo subcutaneous/IM -     Flu Vaccine QUAD 36+ mos IM    Patient has been counseled on age-appropriate routine health concerns for screening and prevention. These are reviewed and up-to-date. Referrals have been placed accordingly. Immunizations are up-to-date or declined.    Subjective:   Chief Complaint  Patient presents with  . Follow-up    Pt. is here for follow up.    HPI Kendra Griffith 53 y.o. female presents to office today for follow up.  has a past medical history of Anemia, Anxiety, Bipolar disorder ([place]), Blood transfusion without reported diagnosis,  Chronic pain, Clotting disorder ([place]), Dental caries, Depression, Diabetes mellitus without complication ([place]), GERD (gastroesophageal reflux disease), Hypertension, Injury, Pre-diabetes, Sickle cell trait ([place]), and Wears glasses.  She has an upcoming appt.with Dermatology for skin changes on arm.   Essential Hypertension Chronic and well controlled. She does not monitor her blood pressure at home. Endorses medication compliance taking HCTZ 25 mg daily. Denies chest pain, shortness of breath, palpitations, lightheadedness, dizziness, headaches or BLE edema.   BP Readings from Last 3 Encounters:  01/18/19 110/73  10/15/18 (!) 169/73  05/04/18 117/77    Tobacco Dependence She is smoking up to 2 ppd.  Is interested in quitting smoking.   Anxiety and Depression She is requesting a refill of her hydroxyzine. Very depressed. She has not been taking her amitriptyline. I requested that she restart as this can also be used for depression.  Denies any current thoughts of self harm but does endorse ruminating thoughts. She has a history of bipolar disorder as well. Unclear which spectrum. Needs to be evaluated.  Patient verbally consented to [facility] services about presenting concerns and psychiatric consultation as appropriate. Depression screen [facility] 2/9 01/18/2019 11/05/2018 05/04/2018 01/31/2018 08/01/2017  Decreased Interest 3 0 3 3 3   Down, Depressed, Hopeless 3 3 3 3 3   PHQ - 2 Score 6 3 6 6 6   Altered sleeping 3 3  3 3 3   Tired, decreased energy 3 3 2 3 3   Change in appetite 3 3 3 3 3   Feeling bad or failure about yourself  3 3 3 3 3   Trouble concentrating 3 3 3 3 3   Moving slowly or fidgety/restless 3 0 0 3 3  Suicidal thoughts 3 0 2 2 3   PHQ-9 Score 27 18 22 26 27   Some recent data might be hidden   GAD 7 : Generalized Anxiety Score 01/18/2019 11/05/2018 05/04/2018 01/31/2018  Nervous, Anxious, on Edge 3 3 3 3   Control/stop worrying 3 3 3 2   Worry too much - different  things 3 3 3 3   Trouble relaxing 3 1 2 3   Restless 3 0 3 3  Easily annoyed or irritable 3 2 3 3   Afraid - awful might happen 3 0 3 2  Total GAD 7 Score 21 12 20 19       Review of Systems  Constitutional: Negative for fever, malaise/fatigue and weight loss.  HENT: Negative.  Negative for nosebleeds.   Eyes: Negative.  Negative for blurred vision, double vision and photophobia.  Respiratory: Negative.  Negative for cough and shortness of breath.   Cardiovascular: Negative.  Negative for chest pain, palpitations and leg swelling.  Gastrointestinal: Negative.  Negative for heartburn, nausea and vomiting.  Musculoskeletal: Negative.  Negative for myalgias.       CPS  Neurological: Negative.  Negative for dizziness, focal weakness, seizures and headaches.  Psychiatric/Behavioral: Positive for depression. Negative for suicidal ideas. The patient is nervous/anxious.     Past Medical History:  Diagnosis Date  . Anemia   . Anxiety   . Bipolar disorder ([place])   . Blood transfusion without reported diagnosis    childbirth  . Chronic pain    feet and back  . Clotting disorder ([place])    childbirth  . Dental caries    lesion soft palate  . Depression   . Diabetes mellitus without complication (HCC)    prediabetes  . GERD (gastroesophageal reflux disease)   . Hypertension   . Injury    right side from jumping off bunkbed  . Pre-diabetes   . Sickle cell trait ([place])   . Wears glasses     Past Surgical History:  Procedure Laterality Date  . CESAREAN SECTION     three  . COLONOSCOPY    . cyst removal 1997    . FACIAL RECONSTRUCTION SURGERY    . FOOT SURGERY Bilateral   . LESION REMOVAL Left 10/15/2018   Procedure: Lesion Removal left soft palate;  Surgeon: Diona Browner, DDS;  Location: [facility];  Service: Oral Surgery;  Laterality: Left;  . TOOTH EXTRACTION Bilateral 10/15/2018   Procedure: DENTAL EXTRACTIONS OF TEETH NUMBER TWO, FOUR, FIVE, SIX, SEVEN, EIGHT, NINE, TEN, ELEVEN,  TWELVE, THIRTEEN, FOURTEEN, SEVENTEEN, TWENTY-ONE, TWENTY-TWO, TWENTY-THREE, TWENTY-FOUR, TWENTY-FIVE, TWENTY-SIX, TWENTY-SEVEN, TWENTY-EIGHT WITH ALVEOLOPLASTY;  Surgeon: Diona Browner, DDS;  Location: [facility];  Service: Oral Surgery;  Laterality: Bilateral;  . TUBAL LIGATION      Family History  Problem Relation Age of Onset  . Heart disease Mother   . Asthma Sister   . Diabetes Sister   . COPD Sister   . Diabetes Sister   . Diabetes Sister   . Breast cancer Neg Hx   . Colon cancer Neg Hx     Social History Reviewed with no changes to be made today.   Outpatient Medications Prior to Visit  Medication Sig Dispense Refill  .  atorvastatin (LIPITOR) 20 MG tablet Take 1 tablet (20 mg total) by mouth daily. 90 tablet 3  . Biotin 5 MG CAPS Take 5 mg by mouth 3 (three) times daily.    . cholecalciferol (VITAMIN D3) 25 MCG (1000 UT) tablet Take 1,000 Units by mouth daily.    . famotidine (PEPCID) 20 MG tablet Take 1 tablet (20 mg total) by mouth daily. 90 tablet 0  . gabapentin (NEURONTIN) 400 MG capsule TAKE 2 CAPSULES BY MOUTH 3 TIMES DAILY. (Patient taking differently: Take 400 mg by mouth 3 (three) times daily. ) 180 capsule 3  . Ginkgo Biloba 120 MG CAPS Take 120 mg by mouth daily.    . hydrochlorothiazide (HYDRODIURIL) 25 MG tablet Take 1 tablet (25 mg total) by mouth daily. 90 tablet 3  . Multiple Vitamin (MULTI-VITAMIN) tablet Take by mouth.    . Multiple Vitamin (MULTIVITAMIN WITH MINERALS) TABS tablet Take 1 tablet by mouth daily.    . Omega-3 Fatty Acids (OMEGA 3 500) 500 MG CAPS Take 500 mg by mouth daily.    . ondansetron (ZOFRAN) 4 MG tablet Take 1 tablet (4 mg total) by mouth every 8 (eight) hours as needed for nausea or vomiting. 20 tablet 0  . vitamin B-12 (CYANOCOBALAMIN) 500 MCG tablet Take 500 mcg by mouth daily.    . vitamin C (ASCORBIC ACID) 500 MG tablet Take 500 mg by mouth daily.    Marland Kitchen amitriptyline (ELAVIL) 50 MG tablet Take 1 tablet (50 mg total) by mouth at bedtime.  30 tablet 3  . ibuprofen (ADVIL,MOTRIN) 800 MG tablet Take 1 tablet (800 mg total) by mouth every 8 (eight) hours as needed. (Patient not taking: Reported on 11/05/2018) 60 tablet 2  . metFORMIN (GLUCOPHAGE-XR) 500 MG 24 hr tablet Take 1 tablet (500 mg total) by mouth 2 (two) times daily. 90 tablet 3   No facility-administered medications prior to visit.     Allergies  Allergen Reactions  . Amoxicillin     Pain Did it involve swelling of the face/tongue/throat, SOB, or low BP? No Did it involve sudden or severe rash/hives, skin peeling, or any reaction on the inside of your mouth or nose? No Did you need to seek medical attention at a hospital or doctor's office? Yes When did it last happen?5 years ago If all above answers are "NO", may proceed with cephalosporin use.   [name] [Propoxyphene N-Acetaminophen] Nausea And Vomiting    Makes her jittery  . Hydrocodone-Acetaminophen Nausea And Vomiting    Upset stomach  . Percocet [Oxycodone-Acetaminophen]     Makes her jittery  . Valium [Diazepam] Other (See Comments)    Makes pt. Feel "out of wack"       Objective:    BP 110/73 (BP Location: Left Arm, Patient Position: Sitting, Cuff Size: Normal)   Pulse 69   Temp 98.2 F (36.8 C) (Oral)   Ht 5\' 5"  (1.651 m)   Wt 161 lb (73 kg)   SpO2 100%   BMI 26.79 kg/m  Wt Readings from Last 3 Encounters:  01/18/19 161 lb (73 kg)  10/15/18 163 lb (73.9 kg)  05/04/18 168 lb 3.2 oz (76.3 kg)    Physical Exam Vitals signs and nursing note reviewed.  Constitutional:      Appearance: She is well-developed.  HENT:     Head: Normocephalic and atraumatic.  Neck:     Musculoskeletal: Normal range of motion.  Cardiovascular:     Rate and Rhythm: Normal rate and  regular rhythm.     Heart sounds: Normal heart sounds. No murmur. No friction rub. No gallop.   Pulmonary:     Effort: Pulmonary effort is normal. No tachypnea or respiratory distress.     Breath sounds: Normal breath  sounds. No decreased breath sounds, wheezing, rhonchi or rales.  Chest:     Chest wall: No tenderness.  Abdominal:     General: Bowel sounds are normal.     Palpations: Abdomen is soft.  Musculoskeletal: Normal range of motion.  Skin:    General: Skin is warm and dry.  Neurological:     Mental Status: She is alert and oriented to person, place, and time.     Coordination: Coordination normal.  Psychiatric:        Mood and Affect: Mood is depressed. Affect is tearful.        Behavior: Behavior is cooperative.        Thought Content: Thought content normal. Thought content does not include homicidal or suicidal ideation. Thought content does not include homicidal or suicidal plan.        Judgment: Judgment normal.        Patient has been counseled extensively about nutrition and exercise as well as the importance of adherence with medications and regular follow-up. The patient was given clear instructions to go to ER or return to medical center if symptoms don't improve, worsen or new problems develop. The patient verbalized understanding.   Follow-up: Return in about 4 weeks (around 02/15/2019) for smoking and sleeping. Schedule with jasmine ASAP.   [name], FNP-BC [facility], [place]   01/20/2019, 12:28 PM

## 2019-01-20 ENCOUNTER — Encounter: Payer: Self-pay | Admitting: Nurse Practitioner

## 2019-01-22 DIAGNOSIS — F315 Bipolar disorder, current episode depressed, severe, with psychotic features: Secondary | ICD-10-CM | POA: Diagnosis not present

## 2019-01-23 ENCOUNTER — Institutional Professional Consult (permissible substitution): Payer: Medicaid Other | Admitting: Licensed Clinical Social Worker

## 2019-01-24 DIAGNOSIS — D171 Benign lipomatous neoplasm of skin and subcutaneous tissue of trunk: Secondary | ICD-10-CM | POA: Diagnosis not present

## 2019-01-24 DIAGNOSIS — L739 Follicular disorder, unspecified: Secondary | ICD-10-CM | POA: Diagnosis not present

## 2019-01-24 DIAGNOSIS — D2362 Other benign neoplasm of skin of left upper limb, including shoulder: Secondary | ICD-10-CM | POA: Diagnosis not present

## 2019-01-24 DIAGNOSIS — Z1283 Encounter for screening for malignant neoplasm of skin: Secondary | ICD-10-CM | POA: Diagnosis not present

## 2019-01-30 ENCOUNTER — Institutional Professional Consult (permissible substitution): Payer: Medicaid Other | Admitting: Licensed Clinical Social Worker

## 2019-02-06 ENCOUNTER — Telehealth: Payer: Self-pay | Admitting: Licensed Clinical Social Worker

## 2019-02-06 NOTE — Telephone Encounter (Signed)
Call placed to patient. LCSW introduced self and explained [facility] services within clinic. Pt shared that she is currently participating in monthly sessions with therapist through [facility]; however, is open to initiating behavioral services with new provider. She agreed to schedule appointment with LCSW to complete assessment on 02/15/2019. No additional concerns noted.

## 2019-02-15 ENCOUNTER — Other Ambulatory Visit: Payer: Self-pay

## 2019-02-15 ENCOUNTER — Institutional Professional Consult (permissible substitution): Payer: Medicaid Other | Admitting: Licensed Clinical Social Worker

## 2019-02-15 ENCOUNTER — Encounter: Payer: Self-pay | Admitting: Nurse Practitioner

## 2019-02-15 ENCOUNTER — Ambulatory Visit: Payer: Medicaid Other | Attending: Nurse Practitioner | Admitting: Nurse Practitioner

## 2019-02-15 DIAGNOSIS — D573 Sickle-cell trait: Secondary | ICD-10-CM | POA: Insufficient documentation

## 2019-02-15 DIAGNOSIS — Z8249 Family history of ischemic heart disease and other diseases of the circulatory system: Secondary | ICD-10-CM | POA: Diagnosis not present

## 2019-02-15 DIAGNOSIS — Z825 Family history of asthma and other chronic lower respiratory diseases: Secondary | ICD-10-CM | POA: Insufficient documentation

## 2019-02-15 DIAGNOSIS — F329 Major depressive disorder, single episode, unspecified: Secondary | ICD-10-CM

## 2019-02-15 DIAGNOSIS — Z833 Family history of diabetes mellitus: Secondary | ICD-10-CM | POA: Insufficient documentation

## 2019-02-15 DIAGNOSIS — F32A Depression, unspecified: Secondary | ICD-10-CM

## 2019-02-15 DIAGNOSIS — F17210 Nicotine dependence, cigarettes, uncomplicated: Secondary | ICD-10-CM | POA: Insufficient documentation

## 2019-02-15 DIAGNOSIS — Z79899 Other long term (current) drug therapy: Secondary | ICD-10-CM | POA: Insufficient documentation

## 2019-02-15 DIAGNOSIS — F419 Anxiety disorder, unspecified: Secondary | ICD-10-CM | POA: Diagnosis not present

## 2019-02-15 DIAGNOSIS — G894 Chronic pain syndrome: Secondary | ICD-10-CM | POA: Insufficient documentation

## 2019-02-15 MED ORDER — DULOXETINE HCL 60 MG PO CPEP: 60.0000 mg | ORAL_CAPSULE | Freq: Every day | ORAL | 0 refills | Status: DC

## 2019-02-15 MED FILL — DULoxetine HCL 60 MG CPEP: 60 | 90 days supply | Qty: 90 | Fill #0

## 2019-02-15 NOTE — Progress Notes (Signed)
Hasn't started the nicotine patches.   States that her sleep hasn't improved.

## 2019-02-15 NOTE — Progress Notes (Signed)
Virtual Visit via Telephone Note Due to national recommendations of social distancing due to [place] 19, telehealth visit is felt to be most appropriate for this patient at this time.  I discussed the limitations, risks, security and privacy concerns of performing an evaluation and management service by telephone and the availability of in person appointments. I also discussed with the patient that there may be a patient responsible charge related to this service. The patient expressed understanding and agreed to proceed.    I connected with Kendra Griffith on 02/15/19  at   2:50 PM EST  EDT by telephone and verified that I am speaking with the correct person using two identifiers.   Consent I discussed the limitations, risks, security and privacy concerns of performing an evaluation and management service by telephone and the availability of in person appointments. I also discussed with the patient that there may be a patient responsible charge related to this service. The patient expressed understanding and agreed to proceed.   Location of Patient: Private Residence   Location of Provider: [place] and [place] participating in Telemedicine visit: [name] FNP-BC [place]    History of Present Illness: Telemedicine visit for: Mood disorder  I saw her last month here in the office and referred her to Integrated Behavioral health services due to her worsening depression and anxiety and history of bipolar disorder. At that time I refilled her hydroxyzine. She had not been taking her elavil and I instructed her that taking this could likely help with her chronic pain and depressed mood/anxiety. She has an appointment later today with the LCSW.  Continues to complain of chronic leg and foot pain. Will order ABI as she is a chronic smoker. Gabapentin does not help with her pain. Will try cymbalta 60 mg daily. She denies alcohol use. However  she has a history of alcoholic neuropathy.  She smokes 1-2 ppd.  Has not picked up her nicotine lozenges or patches.   She endorses increased depression. She states she has been receiving phone calls from motel rooms and she is concerned it could be her son however when she answers no one is on the other line. She has not heard from him a few months ago.  Not sleeping well due to stress and chronic pain. Still not taking elavil as prescribed.   Past Medical History:  Diagnosis Date  . Anemia   . Anxiety   . Bipolar disorder ([place])   . Blood transfusion without reported diagnosis    childbirth  . Chronic pain    feet and back  . Clotting disorder ([place])    childbirth  . Dental caries    lesion soft palate  . Depression   . Diabetes mellitus without complication (HCC)    prediabetes  . GERD (gastroesophageal reflux disease)   . Hypertension   . Injury    right side from jumping off bunkbed  . Pre-diabetes   . Sickle cell trait ([place])   . Wears glasses     Past Surgical History:  Procedure Laterality Date  . CESAREAN SECTION     three  . COLONOSCOPY    . cyst removal 1997    . FACIAL RECONSTRUCTION SURGERY    . FOOT SURGERY Bilateral   . LESION REMOVAL Left 10/15/2018   Procedure: Lesion Removal left soft palate;  Surgeon: Diona Browner, DDS;  Location: [facility];  Service: Oral Surgery;  Laterality: Left;  .  TOOTH EXTRACTION Bilateral 10/15/2018   Procedure: DENTAL EXTRACTIONS OF TEETH NUMBER TWO, FOUR, FIVE, SIX, SEVEN, EIGHT, NINE, TEN, ELEVEN, TWELVE, THIRTEEN, FOURTEEN, SEVENTEEN, TWENTY-ONE, TWENTY-TWO, TWENTY-THREE, TWENTY-FOUR, TWENTY-FIVE, TWENTY-SIX, TWENTY-SEVEN, TWENTY-EIGHT WITH ALVEOLOPLASTY;  Surgeon: Diona Browner, DDS;  Location: [facility];  Service: Oral Surgery;  Laterality: Bilateral;  . TUBAL LIGATION      Family History  Problem Relation Age of Onset  . Heart disease Mother   . Asthma Sister   . Diabetes Sister   . COPD Sister   . Diabetes Sister   . Diabetes  Sister   . Breast cancer Neg Hx   . Colon cancer Neg Hx     Social History   Socioeconomic History  . Marital status: Single    Spouse name: Not on file  . Number of children: 3  . Years of education: 8-9  . Highest education level: Not on file  Occupational History  . Occupation: applying for disability  Social Needs  . Financial resource strain: Not on file  . Food insecurity    Worry: Not on file    Inability: Not on file  . Transportation needs    Medical: Not on file    Non-medical: Not on file  Tobacco Use  . Smoking status: Current Every Day Smoker    Packs/day: 2.00    Years: 29.00    Pack years: 58.00    Types: Cigarettes  . Smokeless tobacco: Never Used  Substance and Sexual Activity  . Alcohol use: No    Alcohol/week: 0.0 standard drinks    Frequency: Never    Comment: Stopped drinking 2013  . Drug use: No  . Sexual activity: Not Currently    Birth control/protection: None  Lifestyle  . Physical activity    Days per week: 0 days    Minutes per session: 0 min  . Stress: Only a little  Relationships  . Social Herbalist on phone: Twice a week    Gets together: Never    Attends religious service: Never    Active member of club or organization: No    Attends meetings of clubs or organizations: Never    Relationship status: Divorced  Other Topics Concern  . Not on file  Social History Narrative   Patient lives alone in an apartment on the first floor.  3 children.  Applying for disability.  Education: 8th or 9th grade.   Currently not working   Trying to go back to school online for [employer]   Six Grandchildren     Observations/Objective: Awake, alert and oriented x 3   Review of Systems  Constitutional: Negative for fever, malaise/fatigue and weight loss.  HENT: Negative.  Negative for nosebleeds.   Eyes: Negative.  Negative for blurred vision, double vision and photophobia.  Respiratory: Negative.  Negative for cough and shortness of  breath.   Cardiovascular: Negative.  Negative for chest pain, palpitations and leg swelling.  Gastrointestinal: Negative.  Negative for heartburn, nausea and vomiting.  Musculoskeletal: Positive for myalgias.  Neurological: Negative.  Negative for dizziness, focal weakness, seizures and headaches.  Psychiatric/Behavioral: Positive for depression. Negative for suicidal ideas. The patient is nervous/anxious.     Assessment and Plan: Kendra Griffith was seen today for insomnia and nicotine dependence.  Diagnoses and all orders for this visit:  Anxiety and depression -     DULoxetine (CYMBALTA) 60 MG capsule; Take 1 capsule (60 mg total) by mouth daily.  Chronic pain syndrome -  DULoxetine (CYMBALTA) 60 MG capsule; Take 1 capsule (60 mg total) by mouth daily.  Follow up with [facility] as instructed. Take Elavil as prescribed.    Follow Up Instructions Return for arterial study with [facility].     I discussed the assessment and treatment plan with the patient. The patient was provided an opportunity to ask questions and all were answered. The patient agreed with the plan and demonstrated an understanding of the instructions.   The patient was advised to call back or seek an in-person evaluation if the symptoms worsen or if the condition fails to improve as anticipated.  I provided 21 minutes of non-face-to-face time during this encounter including median intraservice time, reviewing previous notes, labs, imaging, medications and explaining diagnosis and management.  [name], FNP-BC

## 2019-02-21 ENCOUNTER — Other Ambulatory Visit: Payer: Self-pay

## 2019-02-21 ENCOUNTER — Ambulatory Visit: Payer: Medicaid Other | Attending: Nurse Practitioner

## 2019-02-21 DIAGNOSIS — G894 Chronic pain syndrome: Secondary | ICD-10-CM

## 2019-02-21 NOTE — Progress Notes (Signed)
Patient arrived at clinic to get ABI done.  Patient was given ABI and results are as follow.  Left-1.29 Right-1.32

## 2019-04-19 ENCOUNTER — Ambulatory Visit: Payer: Medicaid Other | Admitting: Podiatry

## 2019-05-29 ENCOUNTER — Other Ambulatory Visit: Payer: Self-pay | Admitting: Nurse Practitioner

## 2019-05-29 ENCOUNTER — Encounter: Payer: Self-pay | Admitting: Nurse Practitioner

## 2019-05-29 DIAGNOSIS — I10 Essential (primary) hypertension: Secondary | ICD-10-CM

## 2019-05-29 DIAGNOSIS — Z1231 Encounter for screening mammogram for malignant neoplasm of breast: Secondary | ICD-10-CM

## 2019-05-29 DIAGNOSIS — G894 Chronic pain syndrome: Secondary | ICD-10-CM

## 2019-05-29 DIAGNOSIS — F603 Borderline personality disorder: Secondary | ICD-10-CM | POA: Diagnosis not present

## 2019-05-29 DIAGNOSIS — E1142 Type 2 diabetes mellitus with diabetic polyneuropathy: Secondary | ICD-10-CM

## 2019-05-30 ENCOUNTER — Other Ambulatory Visit: Payer: Self-pay | Admitting: Nurse Practitioner

## 2019-05-30 DIAGNOSIS — F17200 Nicotine dependence, unspecified, uncomplicated: Secondary | ICD-10-CM

## 2019-05-30 DIAGNOSIS — Z122 Encounter for screening for malignant neoplasm of respiratory organs: Secondary | ICD-10-CM

## 2019-05-30 DIAGNOSIS — N764 Abscess of vulva: Secondary | ICD-10-CM

## 2019-05-30 MED ORDER — ACETAMINOPHEN-CODEINE #3 300-30 MG PO TABS: 1.0000 | ORAL_TABLET | Freq: Three times a day (TID) | ORAL | 0 refills | Status: DC | PRN

## 2019-05-30 MED FILL — HYDROCHLOROTHIAZIDE 25 MG T: 25 | 90 days supply | Qty: 90 | Fill #0

## 2019-05-30 MED FILL — IBUPROFEN 800 MG TABLET: 800 | 20 days supply | Qty: 60 | Fill #0

## 2019-05-30 MED FILL — ACETAMINOPHEN/COD #3 TABLET: 300-30 | 7 days supply | Qty: 21 | Fill #0

## 2019-06-14 ENCOUNTER — Ambulatory Visit (INDEPENDENT_AMBULATORY_CARE_PROVIDER_SITE_OTHER): Payer: Medicaid Other | Admitting: Podiatry

## 2019-06-14 ENCOUNTER — Other Ambulatory Visit: Payer: Self-pay

## 2019-06-14 ENCOUNTER — Encounter: Payer: Self-pay | Admitting: Podiatry

## 2019-06-14 VITALS — Temp 98.0°F

## 2019-06-14 DIAGNOSIS — B351 Tinea unguium: Secondary | ICD-10-CM | POA: Diagnosis not present

## 2019-06-14 DIAGNOSIS — M79671 Pain in right foot: Secondary | ICD-10-CM

## 2019-06-14 DIAGNOSIS — M79672 Pain in left foot: Secondary | ICD-10-CM

## 2019-06-14 DIAGNOSIS — M79674 Pain in right toe(s): Secondary | ICD-10-CM

## 2019-06-14 DIAGNOSIS — M79675 Pain in left toe(s): Secondary | ICD-10-CM | POA: Diagnosis not present

## 2019-06-14 DIAGNOSIS — Q828 Other specified congenital malformations of skin: Secondary | ICD-10-CM

## 2019-06-14 DIAGNOSIS — E1142 Type 2 diabetes mellitus with diabetic polyneuropathy: Secondary | ICD-10-CM

## 2019-06-14 NOTE — Patient Instructions (Signed)
Diabetes Mellitus and Foot Care Foot care is an important part of your health, especially when you have diabetes. Diabetes may cause you to have problems because of poor blood flow (circulation) to your feet and legs, which can cause your skin to:  Become thinner and drier.  Break more easily.  Heal more slowly.  Peel and crack. You may also have nerve damage (neuropathy) in your legs and feet, causing decreased feeling in them. This means that you may not notice minor injuries to your feet that could lead to more serious problems. Noticing and addressing any potential problems early is the best way to prevent future foot problems. How to care for your feet Foot hygiene  Wash your feet daily with warm water and mild soap. Do not use hot water. Then, pat your feet and the areas between your toes until they are completely dry. Do not soak your feet as this can dry your skin.  Trim your toenails straight across. Do not dig under them or around the cuticle. File the edges of your nails with an emery board or nail file.  Apply a moisturizing lotion or petroleum jelly to the skin on your feet and to dry, brittle toenails. Use lotion that does not contain alcohol and is unscented. Do not apply lotion between your toes. Shoes and socks  Wear clean socks or stockings every day. Make sure they are not too tight. Do not wear knee-high stockings since they may decrease blood flow to your legs.  Wear shoes that fit properly and have enough cushioning. Always look in your shoes before you put them on to be sure there are no objects inside.  To break in new shoes, wear them for just a few hours a day. This prevents injuries on your feet. Wounds, scrapes, corns, and calluses  Check your feet daily for blisters, cuts, bruises, sores, and redness. If you cannot see the bottom of your feet, use a mirror or ask someone for help.  Do not cut corns or calluses or try to remove them with medicine.  If you  find a minor scrape, cut, or break in the skin on your feet, keep it and the skin around it clean and dry. You may clean these areas with mild soap and water. Do not clean the area with peroxide, alcohol, or iodine.  If you have a wound, scrape, corn, or callus on your foot, look at it several times a day to make sure it is healing and not infected. Check for: ? Redness, swelling, or pain. ? Fluid or blood. ? Warmth. ? Pus or a bad smell. General instructions  Do not cross your legs. This may decrease blood flow to your feet.  Do not use heating pads or hot water bottles on your feet. They may burn your skin. If you have lost feeling in your feet or legs, you may not know this is happening until it is too late.  Protect your feet from hot and cold by wearing shoes, such as at the beach or on hot pavement.  Schedule a complete foot exam at least once a year (annually) or more often if you have foot problems. If you have foot problems, report any cuts, sores, or bruises to your health care provider immediately. Contact a health care provider if:  You have a medical condition that increases your risk of infection and you have any cuts, sores, or bruises on your feet.  You have an injury that is not   healing.  You have redness on your legs or feet.  You feel burning or tingling in your legs or feet.  You have pain or cramps in your legs and feet.  Your legs or feet are numb.  Your feet always feel cold.  You have pain around a toenail. Get help right away if:  You have a wound, scrape, corn, or callus on your foot and: ? You have pain, swelling, or redness that gets worse. ? You have fluid or blood coming from the wound, scrape, corn, or callus. ? Your wound, scrape, corn, or callus feels warm to the touch. ? You have pus or a bad smell coming from the wound, scrape, corn, or callus. ? You have a fever. ? You have a red line going up your leg. Summary  Check your feet every day  for cuts, sores, red spots, swelling, and blisters.  Moisturize feet and legs daily.  Wear shoes that fit properly and have enough cushioning.  If you have foot problems, report any cuts, sores, or bruises to your health care provider immediately.  Schedule a complete foot exam at least once a year (annually) or more often if you have foot problems. This information is not intended to replace advice given to you by your health care provider. Make sure you discuss any questions you have with your health care provider. Document Revised: 12/19/2018 Document Reviewed: 04/29/2016 Elsevier Patient Education  2020 [employer]  Peripheral Neuropathy Peripheral neuropathy is a type of nerve damage. It affects nerves that carry signals between the spinal cord and the arms, legs, and the rest of the body (peripheral nerves). It does not affect nerves in the spinal cord or brain. In peripheral neuropathy, one nerve or a group of nerves may be damaged. Peripheral neuropathy is a broad category that includes many specific nerve disorders, like diabetic neuropathy, hereditary neuropathy, and carpal tunnel syndrome. What are the causes? This condition may be caused by:  Diabetes. This is the most common cause of peripheral neuropathy.  Nerve injury.  Pressure or stress on a nerve that lasts a long time.  Lack (deficiency) of B vitamins. This can result from alcoholism, poor diet, or a restricted diet.  Infections.  Autoimmune diseases, such as rheumatoid arthritis and systemic lupus erythematosus.  Nerve diseases that are passed from parent to child (inherited).  Some medicines, such as cancer medicines (chemotherapy).  Poisonous (toxic) substances, such as lead and mercury.  Too little blood flowing to the legs.  Kidney disease.  Thyroid disease. In some cases, the cause of this condition is not known. What are the signs or symptoms? Symptoms of this condition depend on which of your  nerves is damaged. Common symptoms include:  Loss of feeling (numbness) in the feet, hands, or both.  Tingling in the feet, hands, or both.  Burning pain.  Very sensitive skin.  Weakness.  Not being able to move a part of the body (paralysis).  Muscle twitching.  Clumsiness or poor coordination.  Loss of balance.  Not being able to control your bladder.  Feeling dizzy.  Sexual problems. How is this diagnosed? Diagnosing and finding the cause of peripheral neuropathy can be difficult. Your health care provider will take your medical history and do a physical exam. A neurological exam will also be done. This involves checking things that are affected by your brain, spinal cord, and nerves (nervous system). For example, your health care provider will check your reflexes, how you move, and   what you can feel. You may have other tests, such as:  Blood tests.  Electromyogram (EMG) and nerve conduction tests. These tests check nerve function and how well the nerves are controlling the muscles.  Imaging tests, such as CT scans or MRI to rule out other causes of your symptoms.  Removing a small piece of nerve to be examined in a lab (nerve biopsy). This is rare.  Removing and examining a small amount of the fluid that surrounds the brain and spinal cord (lumbar puncture). This is rare. How is this treated? Treatment for this condition may involve:  Treating the underlying cause of the neuropathy, such as diabetes, kidney disease, or vitamin deficiencies.  Stopping medicines that can cause neuropathy, such as chemotherapy.  Medicine to relieve pain. Medicines may include: ? Prescription or over-the-counter pain medicine. ? Antiseizure medicine. ? Antidepressants. ? Pain-relieving patches that are applied to painful areas of skin.  Surgery to relieve pressure on a nerve or to destroy a nerve that is causing pain.  Physical therapy to help improve movement and  balance.  Devices to help you move around (assistive devices). Follow these instructions at home: Medicines  Take over-the-counter and prescription medicines only as told by your health care provider. Do not take any other medicines without first asking your health care provider.  Do not drive or use heavy machinery while taking prescription pain medicine. Lifestyle   Do not use any products that contain nicotine or tobacco, such as cigarettes and e-cigarettes. Smoking keeps blood from reaching damaged nerves. If you need help quitting, ask your health care provider.  Avoid or limit alcohol. Too much alcohol can cause a vitamin B deficiency, and vitamin B is needed for healthy nerves.  Eat a healthy diet. This includes: ? Eating foods that are high in fiber, such as fresh fruits and vegetables, whole grains, and beans. ? Limiting foods that are high in fat and processed sugars, such as fried or sweet foods. General instructions   If you have diabetes, work closely with your health care provider to keep your blood sugar under control.  If you have numbness in your feet: ? Check every day for signs of injury or infection. Watch for redness, warmth, and swelling. ? Wear padded socks and comfortable shoes. These help protect your feet.  Develop a good support system. Living with peripheral neuropathy can be stressful. Consider talking with a mental health specialist or joining a support group.  Use assistive devices and attend physical therapy as told by your health care provider. This may include using a walker or a cane.  Keep all follow-up visits as told by your health care provider. This is important. Contact a health care provider if:  You have new signs or symptoms of peripheral neuropathy.  You are struggling emotionally from dealing with peripheral neuropathy.  Your pain is not well-controlled. Get help right away if:  You have an injury or infection that is not healing  normally.  You develop new weakness in an arm or leg.  You fall frequently. Summary  Peripheral neuropathy is when the nerves in the arms, or legs are damaged, resulting in numbness, weakness, or pain.  There are many causes of peripheral neuropathy, including diabetes, pinched nerves, vitamin deficiencies, autoimmune disease, and hereditary conditions.  Diagnosing and finding the cause of peripheral neuropathy can be difficult. Your health care provider will take your medical history, do a physical exam, and do tests, including blood tests and nerve function tests.    Treatment involves treating the underlying cause of the neuropathy and taking medicines to help control pain. Physical therapy and assistive devices may also help. This information is not intended to replace advice given to you by your health care provider. Make sure you discuss any questions you have with your health care provider. Document Revised: 03/10/2017 Document Reviewed: 06/06/2016 Elsevier Patient Education  2020 Else[employer] Inc.  

## 2019-06-19 NOTE — Progress Notes (Addendum)
Subjective: Kendra Griffith presents today for follow up of preventative diabetic foot care and painful porokeratotic lesion(s) plantar aspect of both feet and painful mycotic toenails b/l that limit ambulation. Aggravating factors include weightbearing with and without shoe gear. Pain for both is relieved with periodic professional debridement.   Kendra Griffith would like to discuss other options for treatment regarding her painful plantar lesions.   Allergies  Allergen Reactions  . Amoxicillin     Pain Did it involve swelling of the face/tongue/throat, SOB, or low BP? No Did it involve sudden or severe rash/hives, skin peeling, or any reaction on the inside of your mouth or nose? No Did you need to seek medical attention at a hospital or doctor's office? Yes When did it last happen?5 years ago If all above answers are "NO", may proceed with cephalosporin use.   Kendra Griffith [Propoxyphene N-Acetaminophen] Nausea And Vomiting    Makes her jittery  . Hydrocodone-Acetaminophen Nausea And Vomiting    Upset stomach  . Percocet [Oxycodone-Acetaminophen]     Makes her jittery  . Valium [Diazepam] Other (See Comments)    Makes pt. Feel "out of wack"     Objective: Vitals:   06/14/19 0854  Temp: 70 F (36.7 C)    Pt 54 y.o. year old female  in NAD. AAO x 3.   Vascular Examination:  Capillary fill time to digits <3 seconds b/l. Palpable DP pulses b/l. Palpable PT pulses b/l. Pedal hair present b/l. Skin temperature gradient within normal limits b/l.  Dermatological Examination: Pedal skin with normal turgor, texture and tone bilaterally. No open wounds bilaterally. No interdigital macerations bilaterally. Toenails 1-5 b/l elongated, dystrophic, thickened, crumbly with subungual debris and tenderness to dorsal palpation. Porokeratotic lesion(s) with tenderness to palpation submet heads 5 b/l, submet head 2 left foot, submet head 1 right foot. No erythema, no edema, no drainage, no  flocculence.  Musculoskeletal: Normal muscle strength 5/5 to all lower extremity muscle groups bilaterally, no gross bony deformities bilaterally, no pain crepitus or joint limitation noted with ROM b/l and mild equinus noted b/l LE.  Neurological: Protective sensation decreased with 10 gram monofilament b/l Vibratory sensation intact b/l  Assessment: 1. Pain due to onychomycosis of toenails of both feet   2. Porokeratosis   3. Pain in both feet   4. Diabetic peripheral neuropathy associated with type 2 diabetes mellitus [facility])    Plan: -Medicaid ABN signed for 2021. Copy given to patient on today's visit and copy placed in patient chart. -Continue diabetic foot care principles. Literature dispensed on today.  -We did discuss continuing conservative treatment of her painful porokeratotic lesions vs surgical correction of plantarflexed metatarsals. She would like to speak to a surgeon to discuss the procedure(s) involved further. She was informed she is under no obligation to have surgery. She related understanding. -Toenails 1-5 b/l were debrided in length and girth with sterile nail nippers and dremel without iatrogenic bleeding.  -Painful porokeratotic lesions submet heads 5 b/l, submet head 2 left foot, submet head 1 right foot pared and enucleated with sterile scalpel blade without incident. -Patient to continue soft, supportive shoe gear daily. -Patient to report any pedal injuries to medical professional immediately. -Patient referred for Dr.Brent Evans for surgical consultation regarding plantarflexed metatarsals with painful porokeratotic lesions b/l feet. -Patient/POA to call should there be question/concern in the interim.  Return in about 3 months (around 09/14/2019) for diabetic nail and callus trim.

## 2019-07-04 ENCOUNTER — Ambulatory Visit (INDEPENDENT_AMBULATORY_CARE_PROVIDER_SITE_OTHER): Payer: Medicaid Other | Admitting: Obstetrics and Gynecology

## 2019-07-04 ENCOUNTER — Encounter: Payer: Self-pay | Admitting: Obstetrics and Gynecology

## 2019-07-04 ENCOUNTER — Other Ambulatory Visit: Payer: Self-pay

## 2019-07-04 DIAGNOSIS — N750 Cyst of Bartholin's gland: Secondary | ICD-10-CM | POA: Insufficient documentation

## 2019-07-04 NOTE — Progress Notes (Signed)
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 Pt reports has had problems since 1996. Has under gone numerous I & D's. She also reports episodes of spontaneous discharge. She does have type 2 DM, last A1c 6.3 8/20. BTL for contraception Some menopausal Sx. LMP 12/20.  PE AF VSS Lungs clear  Heart RRR Abd soft + BS GU right bartholin cyst, slightly tender, unable to express any discharge  A/P Chronic Bartholin's abscess  Discussed surgerical exesion of cyst and gland.  R/B/Post op care reviewed. Importance of good DM control reviewed with pt. Limitations of surgery, recurrence and scar tissue discussed with pt. Pt verbalized understanding, desires to proceed. Will schedule.

## 2019-07-04 NOTE — Patient Instructions (Signed)
Bartholin's Cyst  A Bartholin's cyst is a fluid-filled sac that forms on a Bartholin's gland. Bartholin's glands are small glands in the folds of skin around the opening of the vagina (labia). This type of cyst causes a bulge or lump near the lower opening of the vagina. If you have a cyst that is small and not infected, you may be able to take care of it at home. If your cyst gets infected, it may cause pain and your doctor may need to drain it. An infected Bartholin's cyst is called a Bartholin's abscess. Follow these instructions at home: Medicines  Take over-the-counter and prescription medicines only as told by your doctor.  If you were prescribed an antibiotic medicine, take it as told by your doctor. Do not stop taking the antibiotic even if you start to feel better. Managing pain and swelling  Try sitz baths to help with pain and swelling. A sitz bath is a warm water bath in which the water only comes up to your hips and should cover your buttocks. You may take sitz baths a few times a day.  Put heat on the affected area as often as needed. Use the heat source that your doctor recommends, such as a moist heat pack or a heating pad. ? Place a towel between your skin and the heat source. ? Leave the heat on for 20-30 minutes. ? Remove the heat if your skin turns bright red. This is especially important if you cannot feel pain, heat, or cold. You may have a greater risk of getting burned. General instructions  If your cyst or abscess was drained: ? Follow instructions from your doctor about how to take care of your wound. ? Use feminine pads to absorb any fluid.  Do not push on or squeeze your cyst.  Do not have sex until the cyst has gone away or your wound from drainage has healed.  Take these steps to help prevent a Bartholin's cyst from returning, and to prevent other Bartholin's cysts from forming: ? Take a bath or shower once a day. Clean your vaginal area with mild soap and  water when you bathe. ? Practice safe sex to prevent STIs (sexually transmitted infections). Talk with your doctor about how to prevent STIs and which forms of birth control (contraception) may be best for you.  Keep all follow-up visits as told by your doctor. This is important. Contact a doctor if:  You have a fever.  You get redness, swelling, or pain around your cyst.  You have fluid, blood, pus, or a bad smell coming from your cyst.  You have a cyst that gets larger or comes back. Summary  A Bartholin's cyst is a fluid-filled sac that forms on a Bartholin's gland. These small glands are found in the folds of skin around the opening of the vagina (labia).  This type of cyst causes a bulge or lump near the lower opening of the vagina. An infected Bartholin's cyst is called a Bartholin's abscess.  Try sitz baths a few times a day to help with pain and swelling.  Do not push on or squeeze your cyst. This information is not intended to replace advice given to you by your health care provider. Make sure you discuss any questions you have with your health care provider. Document Revised: 01/18/2018 Document Reviewed: 12/28/2016 Elsevier Patient Education  2020 Elsevier Inc.  

## 2019-07-05 ENCOUNTER — Encounter (HOSPITAL_BASED_OUTPATIENT_CLINIC_OR_DEPARTMENT_OTHER): Payer: Self-pay | Admitting: Obstetrics and Gynecology

## 2019-07-05 ENCOUNTER — Ambulatory Visit
Admission: RE | Admit: 2019-07-05 | Discharge: 2019-07-05 | Disposition: A | Discharge status: Home / Self Care | Payer: Medicaid Other | Source: Ambulatory Visit | Attending: Nurse Practitioner | Admitting: Nurse Practitioner

## 2019-07-05 ENCOUNTER — Other Ambulatory Visit: Payer: Self-pay

## 2019-07-05 DIAGNOSIS — Z1231 Encounter for screening mammogram for malignant neoplasm of breast: Secondary | ICD-10-CM

## 2019-07-10 ENCOUNTER — Ambulatory Visit: Payer: Medicaid Other | Admitting: Podiatry

## 2019-07-10 ENCOUNTER — Other Ambulatory Visit (HOSPITAL_COMMUNITY): Payer: Medicaid Other

## 2019-07-12 ENCOUNTER — Other Ambulatory Visit (HOSPITAL_COMMUNITY): Payer: Medicaid Other

## 2019-07-15 ENCOUNTER — Other Ambulatory Visit: Payer: Medicaid Other

## 2019-07-15 ENCOUNTER — Encounter (HOSPITAL_BASED_OUTPATIENT_CLINIC_OR_DEPARTMENT_OTHER)
Admission: RE | Admit: 2019-07-15 | Discharge: 2019-07-15 | Disposition: A | Discharge status: Home / Self Care | Payer: Medicaid Other | Source: Ambulatory Visit

## 2019-07-15 ENCOUNTER — Other Ambulatory Visit (HOSPITAL_COMMUNITY)
Admission: RE | Admit: 2019-07-15 | Discharge: 2019-07-15 | Disposition: A | Discharge status: Home / Self Care | Payer: Medicaid Other | Source: Ambulatory Visit | Attending: Obstetrics and Gynecology | Admitting: Obstetrics and Gynecology

## 2019-07-15 DIAGNOSIS — N751 Abscess of Bartholin's gland: Secondary | ICD-10-CM | POA: Diagnosis present

## 2019-07-15 DIAGNOSIS — Z20822 Contact with and (suspected) exposure to covid-19: Secondary | ICD-10-CM | POA: Insufficient documentation

## 2019-07-15 DIAGNOSIS — Z8249 Family history of ischemic heart disease and other diseases of the circulatory system: Secondary | ICD-10-CM | POA: Diagnosis not present

## 2019-07-15 DIAGNOSIS — D649 Anemia, unspecified: Secondary | ICD-10-CM | POA: Diagnosis not present

## 2019-07-15 DIAGNOSIS — Z885 Allergy status to narcotic agent status: Secondary | ICD-10-CM | POA: Diagnosis not present

## 2019-07-15 DIAGNOSIS — Z833 Family history of diabetes mellitus: Secondary | ICD-10-CM | POA: Diagnosis not present

## 2019-07-15 DIAGNOSIS — E119 Type 2 diabetes mellitus without complications: Secondary | ICD-10-CM | POA: Diagnosis not present

## 2019-07-15 DIAGNOSIS — Z825 Family history of asthma and other chronic lower respiratory diseases: Secondary | ICD-10-CM | POA: Diagnosis not present

## 2019-07-15 DIAGNOSIS — D573 Sickle-cell trait: Secondary | ICD-10-CM | POA: Diagnosis not present

## 2019-07-15 DIAGNOSIS — I10 Essential (primary) hypertension: Secondary | ICD-10-CM | POA: Diagnosis not present

## 2019-07-15 DIAGNOSIS — F319 Bipolar disorder, unspecified: Secondary | ICD-10-CM | POA: Diagnosis not present

## 2019-07-15 DIAGNOSIS — R9431 Abnormal electrocardiogram [ECG] [EKG]: Secondary | ICD-10-CM | POA: Diagnosis not present

## 2019-07-15 DIAGNOSIS — Z01812 Encounter for preprocedural laboratory examination: Secondary | ICD-10-CM | POA: Insufficient documentation

## 2019-07-15 DIAGNOSIS — Z880 Allergy status to penicillin: Secondary | ICD-10-CM | POA: Diagnosis not present

## 2019-07-15 DIAGNOSIS — Z888 Allergy status to other drugs, medicaments and biological substances status: Secondary | ICD-10-CM | POA: Diagnosis not present

## 2019-07-15 DIAGNOSIS — F419 Anxiety disorder, unspecified: Secondary | ICD-10-CM | POA: Diagnosis not present

## 2019-07-15 DIAGNOSIS — K219 Gastro-esophageal reflux disease without esophagitis: Secondary | ICD-10-CM | POA: Diagnosis not present

## 2019-07-15 DIAGNOSIS — F17210 Nicotine dependence, cigarettes, uncomplicated: Secondary | ICD-10-CM | POA: Diagnosis not present

## 2019-07-15 LAB — BASIC METABOLIC PANEL
Anion gap: 6 (ref 5–15)
BUN: 13 mg/dL (ref 6–20)
CO2: 27 mmol/L (ref 22–32)
Calcium: 9.6 mg/dL (ref 8.9–10.3)
Chloride: 108 mmol/L (ref 98–111)
Creatinine, Ser: 0.83 mg/dL (ref 0.44–1.00)
GFR calc Af Amer: >60 mL/min (ref >60)
GFR calc non Af Amer: >60 mL/min (ref >60)
Glucose, Bld: 105 mg/dL — ABNORMAL HIGH (ref 70–99)
Potassium: 4.1 mmol/L (ref 3.5–5.1)
Sodium: 141 mmol/L (ref 135–145)

## 2019-07-15 LAB — SARS CORONAVIRUS 2 (TAT 6-24 HRS): SARS Coronavirus 2: NEGATIVE

## 2019-07-15 LAB — CBC
HCT: 39.5 % (ref 36.0–46.0)
Hemoglobin: 12.2 g/dL (ref 12.0–15.0)
MCH: 27.3 pg (ref 26.0–34.0)
MCHC: 30.9 g/dL (ref 30.0–36.0)
MCV: 88.4 fL (ref 80.0–100.0)
Platelets: 172 10*3/uL (ref 150–400)
RBC: 4.47 MIL/uL (ref 3.87–5.11)
RDW: 14.4 % (ref 11.5–15.5)
WBC: 8.1 10*3/uL (ref 4.0–10.5)
nRBC: 0 % (ref 0.0–0.2)

## 2019-07-15 LAB — POCT PREGNANCY, URINE: Preg Test, Ur: NEGATIVE

## 2019-07-15 NOTE — H&P (Signed)
Kendra Griffith is an 54 y.o. female with a H/O chronic bartholin cyst/abscess since 1996. Has had numerous in office I & D's but continues to have problems. Occ spontaneous drainage.   H/O DM, managed by PCP  Menstrual History: Menarche age: 59 Patient's last menstrual period was 03/12/2019 (approximate).    Past Medical History:  Diagnosis Date  . ABSCESS, TOOTH 08/20/2009   Qualifier: Diagnosis of  By: Jorene Minors, Scott    . Anemia   . Anxiety   . Bipolar disorder ([place])   . Blood transfusion without reported diagnosis    childbirth  . Chronic pain    feet and back  . Clotting disorder ([place])    childbirth  . Dental caries    lesion soft palate  . Depression   . Diabetes mellitus without complication ([facility])    prediabetes  . GERD (gastroesophageal reflux disease)   . Hypertension   . Injury    right side from jumping off bunkbed  . Pre-diabetes   . Sickle cell trait ([place])   . Wears glasses     Past Surgical History:  Procedure Laterality Date  . CESAREAN SECTION     three  . COLONOSCOPY    . cyst removal 1997    . FACIAL RECONSTRUCTION SURGERY    . FOOT SURGERY Bilateral   . LESION REMOVAL Left 10/15/2018   Procedure: Lesion Removal left soft palate;  Surgeon: Diona Browner, DDS;  Location: [facility];  Service: Oral Surgery;  Laterality: Left;  . TOOTH EXTRACTION Bilateral 10/15/2018   Procedure: DENTAL EXTRACTIONS OF TEETH NUMBER TWO, FOUR, FIVE, SIX, SEVEN, EIGHT, NINE, TEN, ELEVEN, TWELVE, THIRTEEN, FOURTEEN, SEVENTEEN, TWENTY-ONE, TWENTY-TWO, TWENTY-THREE, TWENTY-FOUR, TWENTY-FIVE, TWENTY-SIX, TWENTY-SEVEN, TWENTY-EIGHT WITH ALVEOLOPLASTY;  Surgeon: Diona Browner, DDS;  Location: [facility];  Service: Oral Surgery;  Laterality: Bilateral;  . TUBAL LIGATION      Family History  Problem Relation Age of Onset  . Heart disease Mother   . Asthma Sister   . Diabetes Sister   . COPD Sister   . Diabetes Sister   . Diabetes Sister   . Breast cancer Neg Hx   . Colon cancer  Neg Hx     Social History:  reports that she has been smoking cigarettes. She has a 58.00 pack-year smoking history. She has never used smokeless tobacco. She reports that she does not drink alcohol or use drugs.  Allergies:  Allergies  Allergen Reactions  . Amoxicillin     Pain Did it involve swelling of the face/tongue/throat, SOB, or low BP? No Did it involve sudden or severe rash/hives, skin peeling, or any reaction on the inside of your mouth or nose? No Did you need to seek medical attention at a hospital or doctor's office? Yes When did it last happen?5 years ago If all above answers are "NO", may proceed with cephalosporin use.   [name] [Propoxyphene N-Acetaminophen] Nausea And Vomiting    Makes her jittery  . Hydrocodone-Acetaminophen Nausea And Vomiting    Upset stomach  . Percocet [Oxycodone-Acetaminophen]     Makes her jittery  . Valium [Diazepam] Other (See Comments)    Makes pt. Feel "out of wack"    No medications prior to admission.    Review of Systems  Constitutional: Negative.   Respiratory: Negative.   Cardiovascular: Negative.   Gastrointestinal: Negative.   Genitourinary: Negative.     Height 5\' 5"  (1.651 m), weight 73.9 kg, last menstrual period 03/12/2019, unknown if currently breastfeeding. Physical  Exam  Constitutional: She appears well-developed and well-nourished.  Cardiovascular: Normal rate and regular rhythm.  Respiratory: Effort normal and breath sounds normal.  GI: Soft. Bowel sounds are normal.  Genitourinary:    Genitourinary Comments: Right bartholin cyst  @ 1 cm x 1 cm      No results found for this or any previous visit (from the past 24 hour(s)).  No results found.  Assessment/Plan: Chronic Bartholin's cyst/abscess  Pt desires definite therapy. Excision of cyst with gland reviewed with pt. R/B/Post op care reviewed. Potential for development of scar tissue and recurrence reviewed with pt. Pt verbalized  understanding and desires to proceed.   Chancy Milroy 07/15/2019, 10:31 AM

## 2019-07-15 NOTE — Progress Notes (Signed)

## 2019-07-16 ENCOUNTER — Encounter (HOSPITAL_BASED_OUTPATIENT_CLINIC_OR_DEPARTMENT_OTHER)
Admission: RE | Disposition: A | Discharge status: Home / Self Care | Payer: Self-pay | Source: Home / Self Care | Attending: Obstetrics and Gynecology

## 2019-07-16 ENCOUNTER — Encounter (HOSPITAL_BASED_OUTPATIENT_CLINIC_OR_DEPARTMENT_OTHER): Payer: Self-pay | Admitting: Obstetrics and Gynecology

## 2019-07-16 ENCOUNTER — Ambulatory Visit (HOSPITAL_BASED_OUTPATIENT_CLINIC_OR_DEPARTMENT_OTHER): Payer: Medicaid Other | Admitting: Anesthesiology

## 2019-07-16 ENCOUNTER — Other Ambulatory Visit: Payer: Self-pay

## 2019-07-16 ENCOUNTER — Ambulatory Visit (HOSPITAL_BASED_OUTPATIENT_CLINIC_OR_DEPARTMENT_OTHER)
Admission: RE | Admit: 2019-07-16 | Discharge: 2019-07-16 | Disposition: A | Discharge status: Home / Self Care | Payer: Medicaid Other | Attending: Obstetrics and Gynecology | Admitting: Obstetrics and Gynecology

## 2019-07-16 DIAGNOSIS — F419 Anxiety disorder, unspecified: Secondary | ICD-10-CM | POA: Diagnosis not present

## 2019-07-16 DIAGNOSIS — Z8249 Family history of ischemic heart disease and other diseases of the circulatory system: Secondary | ICD-10-CM | POA: Insufficient documentation

## 2019-07-16 DIAGNOSIS — I10 Essential (primary) hypertension: Secondary | ICD-10-CM | POA: Insufficient documentation

## 2019-07-16 DIAGNOSIS — D649 Anemia, unspecified: Secondary | ICD-10-CM | POA: Insufficient documentation

## 2019-07-16 DIAGNOSIS — F319 Bipolar disorder, unspecified: Secondary | ICD-10-CM | POA: Insufficient documentation

## 2019-07-16 DIAGNOSIS — N751 Abscess of Bartholin's gland: Secondary | ICD-10-CM | POA: Diagnosis not present

## 2019-07-16 DIAGNOSIS — Z888 Allergy status to other drugs, medicaments and biological substances status: Secondary | ICD-10-CM | POA: Insufficient documentation

## 2019-07-16 DIAGNOSIS — D573 Sickle-cell trait: Secondary | ICD-10-CM | POA: Insufficient documentation

## 2019-07-16 DIAGNOSIS — Z825 Family history of asthma and other chronic lower respiratory diseases: Secondary | ICD-10-CM | POA: Insufficient documentation

## 2019-07-16 DIAGNOSIS — Z885 Allergy status to narcotic agent status: Secondary | ICD-10-CM | POA: Insufficient documentation

## 2019-07-16 DIAGNOSIS — F17210 Nicotine dependence, cigarettes, uncomplicated: Secondary | ICD-10-CM | POA: Insufficient documentation

## 2019-07-16 DIAGNOSIS — F418 Other specified anxiety disorders: Secondary | ICD-10-CM | POA: Diagnosis not present

## 2019-07-16 DIAGNOSIS — Z833 Family history of diabetes mellitus: Secondary | ICD-10-CM | POA: Insufficient documentation

## 2019-07-16 DIAGNOSIS — Z880 Allergy status to penicillin: Secondary | ICD-10-CM | POA: Insufficient documentation

## 2019-07-16 DIAGNOSIS — N750 Cyst of Bartholin's gland: Secondary | ICD-10-CM | POA: Diagnosis not present

## 2019-07-16 DIAGNOSIS — R9431 Abnormal electrocardiogram [ECG] [EKG]: Secondary | ICD-10-CM | POA: Insufficient documentation

## 2019-07-16 DIAGNOSIS — K219 Gastro-esophageal reflux disease without esophagitis: Secondary | ICD-10-CM | POA: Insufficient documentation

## 2019-07-16 DIAGNOSIS — E119 Type 2 diabetes mellitus without complications: Secondary | ICD-10-CM | POA: Insufficient documentation

## 2019-07-16 DIAGNOSIS — A541 Gonococcal infection of lower genitourinary tract with periurethral and accessory gland abscess: Secondary | ICD-10-CM | POA: Diagnosis not present

## 2019-07-16 HISTORY — PX: BARTHOLIN CYST MARSUPIALIZATION: SHX5383

## 2019-07-16 LAB — GLUCOSE, CAPILLARY: Glucose-Capillary: 114 mg/dL — ABNORMAL HIGH (ref 70–99)

## 2019-07-16 SURGERY — MARSUPIALIZATION, CYST, BARTHOLIN'S GLAND
Anesthesia: General | Site: Vagina

## 2019-07-16 MED ORDER — ACETAMINOPHEN 500 MG PO TABS
1000.0000 mg | ORAL_TABLET | ORAL | Status: AC
  Administered 2019-07-16: 1000 mg via ORAL

## 2019-07-16 MED ORDER — BUPIVACAINE HCL 0.25 % IJ SOLN
INTRAMUSCULAR | Status: DC | PRN
  Administered 2019-07-16: 10 mL

## 2019-07-16 MED ORDER — PROMETHAZINE HCL 25 MG/ML IJ SOLN: 6.2500 mg | INTRAMUSCULAR | Status: DC | PRN

## 2019-07-16 MED ORDER — FENTANYL CITRATE (PF) 100 MCG/2ML IJ SOLN: 50.0000 ug | INTRAMUSCULAR | Status: DC | PRN

## 2019-07-16 MED ORDER — LIDOCAINE 2% (20 MG/ML) 5 ML SYRINGE
INTRAMUSCULAR | Status: DC | PRN
  Administered 2019-07-16: 50 mg via INTRAVENOUS

## 2019-07-16 MED ORDER — SOD CITRATE-CITRIC ACID 500-334 MG/5ML PO SOLN
ORAL | Status: AC
  Filled 2019-07-16: qty 30

## 2019-07-16 MED ORDER — HYDROMORPHONE HCL 1 MG/ML IJ SOLN
0.2500 mg | INTRAMUSCULAR | Status: DC | PRN
  Administered 2019-07-16: 0.5 mg via INTRAVENOUS

## 2019-07-16 MED ORDER — FENTANYL CITRATE (PF) 100 MCG/2ML IJ SOLN
INTRAMUSCULAR | Status: DC | PRN
  Administered 2019-07-16: 25 ug via INTRAVENOUS
  Administered 2019-07-16: 100 ug via INTRAVENOUS

## 2019-07-16 MED ORDER — KETOROLAC TROMETHAMINE 15 MG/ML IJ SOLN
15.0000 mg | INTRAMUSCULAR | Status: AC
  Administered 2019-07-16: 07:00:00 15 mg via INTRAVENOUS

## 2019-07-16 MED ORDER — PROPOFOL 10 MG/ML IV BOLUS
INTRAVENOUS | Status: DC | PRN
  Administered 2019-07-16 (×2): 50 mg via INTRAVENOUS

## 2019-07-16 MED ORDER — DEXAMETHASONE SODIUM PHOSPHATE 10 MG/ML IJ SOLN
INTRAMUSCULAR | Status: AC
  Filled 2019-07-16: qty 1

## 2019-07-16 MED ORDER — FENTANYL CITRATE (PF) 100 MCG/2ML IJ SOLN
INTRAMUSCULAR | Status: AC
  Filled 2019-07-16: qty 2

## 2019-07-16 MED ORDER — DOXYCYCLINE HYCLATE 100 MG IV SOLR
200.0000 mg | INTRAVENOUS | Status: AC
  Administered 2019-07-16: 100 mg via INTRAVENOUS

## 2019-07-16 MED ORDER — LIDOCAINE 2% (20 MG/ML) 5 ML SYRINGE
INTRAMUSCULAR | Status: AC
  Filled 2019-07-16: qty 5

## 2019-07-16 MED ORDER — KETOROLAC TROMETHAMINE 15 MG/ML IJ SOLN
INTRAMUSCULAR | Status: AC
  Filled 2019-07-16: qty 1

## 2019-07-16 MED ORDER — HYDROMORPHONE HCL 1 MG/ML IJ SOLN
INTRAMUSCULAR | Status: AC
  Filled 2019-07-16: qty 0.5

## 2019-07-16 MED ORDER — MEPERIDINE HCL 25 MG/ML IJ SOLN: 6.2500 mg | INTRAMUSCULAR | Status: DC | PRN

## 2019-07-16 MED ORDER — DEXAMETHASONE SODIUM PHOSPHATE 4 MG/ML IJ SOLN
INTRAMUSCULAR | Status: DC | PRN
  Administered 2019-07-16: 10 mg via INTRAVENOUS

## 2019-07-16 MED ORDER — MIDAZOLAM HCL 5 MG/5ML IJ SOLN
INTRAMUSCULAR | Status: DC | PRN
  Administered 2019-07-16: 2 mg via INTRAVENOUS

## 2019-07-16 MED ORDER — ONDANSETRON HCL 4 MG/2ML IJ SOLN
INTRAMUSCULAR | Status: DC | PRN
  Administered 2019-07-16: 4 mg via INTRAVENOUS

## 2019-07-16 MED ORDER — LACTATED RINGERS IV SOLN: INTRAVENOUS | Status: DC

## 2019-07-16 MED ORDER — PROPOFOL 10 MG/ML IV BOLUS
INTRAVENOUS | Status: AC
  Filled 2019-07-16: qty 20

## 2019-07-16 MED ORDER — ONDANSETRON HCL 4 MG/2ML IJ SOLN
INTRAMUSCULAR | Status: AC
  Filled 2019-07-16: qty 2

## 2019-07-16 MED ORDER — ACETAMINOPHEN 500 MG PO TABS
ORAL_TABLET | ORAL | Status: AC
  Filled 2019-07-16: qty 2

## 2019-07-16 MED ORDER — MIDAZOLAM HCL 2 MG/2ML IJ SOLN: 1.0000 mg | INTRAMUSCULAR | Status: DC | PRN

## 2019-07-16 MED ORDER — TRAMADOL HCL 50 MG PO TABS: 50.0000 mg | ORAL_TABLET | Freq: Four times a day (QID) | ORAL | 0 refills | Status: DC | PRN

## 2019-07-16 MED ORDER — MIDAZOLAM HCL 2 MG/2ML IJ SOLN
INTRAMUSCULAR | Status: AC
  Filled 2019-07-16: qty 2

## 2019-07-16 MED ORDER — SOD CITRATE-CITRIC ACID 500-334 MG/5ML PO SOLN
30.0000 mL | ORAL | Status: AC
  Administered 2019-07-16: 07:00:00 30 mL via ORAL

## 2019-07-16 MED ORDER — SODIUM CHLORIDE 0.9 % IV SOLN
INTRAVENOUS | Status: AC
  Filled 2019-07-16: qty 100

## 2019-07-16 SURGICAL SUPPLY — 31 items
BLADE SURG 11 STRL SS (BLADE) ×2 IMPLANT
BNDG COHESIVE 2X5 TAN STRL LF (GAUZE/BANDAGES/DRESSINGS) ×1 IMPLANT
CATH ROBINSON RED A/P 16FR (CATHETERS) ×1 IMPLANT
ELECT REM PT RETURN 9FT ADLT (ELECTROSURGICAL) ×2
ELECTRODE REM PT RTRN 9FT ADLT (ELECTROSURGICAL) IMPLANT
GAUZE 4X4 16PLY RFD (DISPOSABLE) ×1 IMPLANT
GLOVE ECLIPSE 7.0 STRL STRAW (GLOVE) ×3 IMPLANT
GLOVE SURG SS PI 6.5 STRL IVOR (GLOVE) ×1 IMPLANT
GOWN STRL REUS W/ TWL LRG LVL3 (GOWN DISPOSABLE) ×1 IMPLANT
GOWN STRL REUS W/ TWL XL LVL3 (GOWN DISPOSABLE) ×1 IMPLANT
GOWN STRL REUS W/TWL LRG LVL3 (GOWN DISPOSABLE) ×4
GOWN STRL REUS W/TWL XL LVL3 (GOWN DISPOSABLE) ×2
NEEDLE HYPO 22GX1.5 SAFETY (NEEDLE) ×2 IMPLANT
NS IRRIG 1000ML POUR BTL (IV SOLUTION) ×2 IMPLANT
PACK VAGINAL MINOR WOMEN LF (CUSTOM PROCEDURE TRAY) ×2 IMPLANT
PAD OB MATERNITY 4.3X12.25 (PERSONAL CARE ITEMS) ×2 IMPLANT
PENCIL BUTTON HOLSTER BLD 10FT (ELECTRODE) ×1 IMPLANT
SLEEVE SCD COMPRESS KNEE MED (MISCELLANEOUS) ×1 IMPLANT
SUT MON AB 3-0 SH 27 (SUTURE) ×4
SUT MON AB 3-0 SH27 (SUTURE) ×2 IMPLANT
SUT VIC AB 0 CT1 27 (SUTURE) ×4
SUT VIC AB 0 CT1 27XBRD ANBCTR (SUTURE) IMPLANT
SUT VIC AB 2-0 CT1 27 (SUTURE) ×4
SUT VIC AB 2-0 CT1 TAPERPNT 27 (SUTURE) IMPLANT
SUT VIC AB 3-0 CT1 27 (SUTURE) ×4
SUT VIC AB 3-0 CT1 27XBRD (SUTURE) IMPLANT
SWAB COLLECTION DEVICE MRSA (MISCELLANEOUS) IMPLANT
SWAB CULTURE ESWAB REG 1ML (MISCELLANEOUS) IMPLANT
TOWEL GREEN STERILE FF (TOWEL DISPOSABLE) ×4 IMPLANT
TUBE CONNECTING 20X1/4 (TUBING) ×2 IMPLANT
YANKAUER SUCT BULB TIP NO VENT (SUCTIONS) ×2 IMPLANT

## 2019-07-16 NOTE — Transfer of Care (Signed)
Immediate Anesthesia Transfer of Care Note  Patient: Kendra Griffith  Procedure(s) Performed: EXCISION OF BARTHOLIN CYST (N/A Vagina )  Patient Location: PACU  Anesthesia Type:General  Level of Consciousness: awake and patient cooperative  Airway & Oxygen Therapy: Patient Spontanous Breathing and Patient connected to face mask oxygen  Post-op Assessment: Report given to RN and Post -op Vital signs reviewed and stable  Post vital signs: Reviewed and stable  Last Vitals:  Vitals Value Taken Time  BP 143/106 07/16/19 1005  Temp    Pulse 109 07/16/19 1007  Resp 26 07/16/19 1007  SpO2 94 % 07/16/19 1007  Vitals shown include unvalidated device data.  Last Pain:  Vitals:   07/16/19 0710  TempSrc: Tympanic  PainSc: 0-No pain      Patients Stated Pain Goal: 6 ([id number])  Complications: No apparent anesthesia complications

## 2019-07-16 NOTE — Interval H&P Note (Signed)
History and Physical Interval Note:  07/16/2019 8:09 AM  Kendra Griffith  has presented today for surgery, with the diagnosis of Bartholin Cyst.  The various methods of treatment have been discussed with the patient and family. After consideration of risks, benefits and other options for treatment, the patient has consented to  Procedure(s): EXCISION OF BARTHOLIN CYST (N/A) as a surgical intervention.  The patient's history has been reviewed, patient examined, no change in status, stable for surgery.  I have reviewed the patient's chart and labs.  Questions were answered to the patient's satisfaction.     Chancy Milroy

## 2019-07-16 NOTE — Anesthesia Postprocedure Evaluation (Signed)
Anesthesia Post Note  Patient: Kendra Griffith  Procedure(s) Performed: EXCISION OF BARTHOLIN CYST (N/A Vagina )     Patient location during evaluation: PACU Anesthesia Type: General Level of consciousness: awake and alert Pain management: pain level controlled Vital Signs Assessment: post-procedure vital signs reviewed and stable Respiratory status: spontaneous breathing, nonlabored ventilation and respiratory function stable Cardiovascular status: blood pressure returned to baseline and stable Postop Assessment: no apparent nausea or vomiting Anesthetic complications: no    Last Vitals:  Vitals:   07/16/19 1100 07/16/19 1115  BP: 127/85 122/64  Pulse: 95 75  Resp: 20 (!) 22  Temp:    SpO2: 100% 100%    Last Pain:  Vitals:   07/16/19 1100  TempSrc:   PainSc: [place]

## 2019-07-16 NOTE — Anesthesia Procedure Notes (Signed)
Procedure Name: LMA Insertion Date/Time: 07/16/2019 8:35 AM Performed by: Marrianne Mood, CRNA Pre-anesthesia Checklist: Patient identified, Emergency Drugs available, Suction available, Patient being monitored and Timeout performed Patient Re-evaluated:Patient Re-evaluated prior to induction Oxygen Delivery Method: Circle system utilized Preoxygenation: Pre-oxygenation with 100% oxygen Induction Type: IV induction Ventilation: Mask ventilation without difficulty LMA: LMA inserted LMA Size: 4.0 Number of attempts: 1 Airway Equipment and Method: Bite block Placement Confirmation: positive ETCO2 Tube secured with: Tape Dental Injury: Teeth and Oropharynx as per pre-operative assessment

## 2019-07-16 NOTE — Anesthesia Preprocedure Evaluation (Signed)
Anesthesia Evaluation  Patient identified by MRN, date of birth, ID band Patient awake    Reviewed: Allergy & Precautions, H&P , NPO status , Patient's Chart, lab work & pertinent test results, reviewed documented beta blocker date and time   Airway Mallampati: II  TM Distance: >3 FB Neck ROM: full    Dental  (+) Edentulous Lower, Edentulous Upper   Pulmonary neg pulmonary ROS, Current Smoker and Patient abstained from smoking.,    Pulmonary exam normal breath sounds clear to auscultation       Cardiovascular Exercise Tolerance: Good hypertension, Pt. on medications negative cardio ROS   Rhythm:regular Rate:Normal  EKG: 12/10/17: Normal sinus rhythm Anterior infarct , age undetermined Abnormal ECG No significant change since last tracing Confirmed by Deno Etienne [id number]) on 12/10/2017 3:13:00 PM   CV: Echo 11/16/09: Study Conclusions  Left ventricle: The cavity size was normal. Systolic function was  normal. The estimated ejection fraction was in the range of 50% to  55%. Wall motion was normal; there were no regional wall motion  abnormalities   Neuro/Psych Anxiety Depression Bipolar Disorder negative neurological ROS  negative psych ROS   GI/Hepatic Neg liver ROS, GERD  ,  Endo/Other  negative endocrine ROS  Renal/GU negative Renal ROS  negative genitourinary   Musculoskeletal   Abdominal   Peds  Hematology  (+) Blood dyscrasia, Sickle cell trait and anemia ,   Anesthesia Other Findings   Reproductive/Obstetrics negative OB ROS                             Anesthesia Physical  Anesthesia Plan  ASA: III  Anesthesia Plan: General   Post-op Pain Management:    Induction: Intravenous  PONV Risk Score and Plan: 3 and Ondansetron, Treatment may vary due to age or medical condition, Dexamethasone and Midazolam  Airway Management Planned: LMA  Additional Equipment:    Intra-op Plan:   Post-operative Plan:   Informed Consent: I have reviewed the patients History and Physical, chart, labs and discussed the procedure including the risks, benefits and alternatives for the proposed anesthesia with the patient or authorized representative who has indicated his/her understanding and acceptance.     Dental Advisory Given  Plan Discussed with: CRNA, Anesthesiologist and Surgeon  Anesthesia Plan Comments: (PAT note written 10/11/2018 by Myra Gianotti, PA-C. )        Anesthesia Quick Evaluation

## 2019-07-16 NOTE — Op Note (Signed)
Kendra Griffith PROCEDURE DATE: 07/16/2019  PREOPERATIVE DIAGNOSIS: Chronic Right Bartholin's Abscess/Cyst POSTOPERATIVE DIAGNOSIS: The same PROCEDURE: Excision of Right Bartholin's Cyst with gland SURGEON:  Dr. Arlina Robes  INDICATIONS: 54 y.o. [phone] here for management of chronic Right Bartholin's abscess/cyst since 1996.  Risks of surgery were discussed with the patient including but not limited to: bleeding, infection which may require antibiotics, injury to surrounding organs, need for additional procedures, and other postoperative/anesthesia complications. Written informed consent was obtained.    FINDINGS:  2 cm Bartholin's cyst with some purulent drainage.   ANESTHESIA:  LMA and 10 cc 0.25 % plain Marcaine, local INTRAVENOUS FLUIDS:As recorded ESTIMATED BLOOD LOSS:200 ml SPECIMENS: Bartholin's cyst/gland COMPLICATIONS: None immediate  PROCEDURE IN DETAIL:  The patient received intravenous Doxycycline in pre op.  She was then taken to the operating room where an LMA  was placed and was found to be adequate.  She was placed in the dorsal lithotomy position, and was prepped and draped in a sterile manner.  Her bladder was catheterized for clear, yellow urine. After an adequate timeout was performed, attention was turned to the right side of her vulva where the cyst  was palpated. An incision was made over the Bartholin's cyst on the inside of the vagina. The surround tissue was dissected sharply and bluntly from around the cyst. During this portion of the procedure the cyst wall ruptured and some purluent material was expressed. The dissection continued to the base of the cyst and gland. The cyst and gland were then removed. The base of the wound was then closed with 0 vicryl in a interrupted fashion and in a purse string fashion, closing all dead space.  Good hemostasis was noted.The vaginal mucosal edges were then closed with 3/0 vicryl.around the introitus,   The patient will be  discharged to home as per PACU criteria.  Routine postoperative instructions given.

## 2019-07-16 NOTE — Discharge Instructions (Signed)
Bartholin's Cyst  A Bartholin's cyst is a fluid-filled sac that forms on a Bartholin's gland. Bartholin's glands are small glands in the folds of skin around the opening of the vagina (labia). This type of cyst causes a bulge or lump near the lower opening of the vagina. If you have a cyst that is small and not infected, you may be able to take care of it at home. If your cyst gets infected, it may cause pain and your doctor may need to drain it. An infected Bartholin's cyst is called a Bartholin's abscess. Follow these instructions at home: Medicines  Take over-the-counter and prescription medicines only as told by your doctor.  If you were prescribed an antibiotic medicine, take it as told by your doctor. Do not stop taking the antibiotic even if you start to feel better. Managing pain and swelling  Try sitz baths to help with pain and swelling. A sitz bath is a warm water bath in which the water only comes up to your hips and should cover your buttocks. You may take sitz baths a few times a day.  Put heat on the affected area as often as needed. Use the heat source that your doctor recommends, such as a moist heat pack or a heating pad. ? Place a towel between your skin and the heat source. ? Leave the heat on for 20-30 minutes. ? Remove the heat if your skin turns bright red. This is especially important if you cannot feel pain, heat, or cold. You may have a greater risk of getting burned. General instructions  If your cyst or abscess was drained: ? Follow instructions from your doctor about how to take care of your wound. ? Use feminine pads to absorb any fluid.  Do not push on or squeeze your cyst.  Do not have sex until the cyst has gone away or your wound from drainage has healed.  Take these steps to help prevent a Bartholin's cyst from returning, and to prevent other Bartholin's cysts from forming: ? Take a bath or shower once a day. Clean your vaginal area with mild soap and  water when you bathe. ? Practice safe sex to prevent STIs (sexually transmitted infections). Talk with your doctor about how to prevent STIs and which forms of birth control (contraception) may be best for you.  Keep all follow-up visits as told by your doctor. This is important. Contact a doctor if:  You have a fever.  You get redness, swelling, or pain around your cyst.  You have fluid, blood, pus, or a bad smell coming from your cyst.  You have a cyst that gets larger or comes back. Summary  A Bartholin's cyst is a fluid-filled sac that forms on a Bartholin's gland. These small glands are found in the folds of skin around the opening of the vagina (labia).  This type of cyst causes a bulge or lump near the lower opening of the vagina. An infected Bartholin's cyst is called a Bartholin's abscess.  Try sitz baths a few times a day to help with pain and swelling.  Do not push on or squeeze your cyst. This information is not intended to replace advice given to you by your health care provider. Make sure you discuss any questions you have with your health care provider. Document Revised: 01/18/2018 Document Reviewed: 12/28/2016 Elsevier Patient Education  [place] Instructions  Activity: Get plenty of rest for the remainder of the  day. A responsible individual must stay with you for 24 hours following the procedure.  For the next 24 hours, DO NOT: -Drive a car -Paediatric nurse -Drink alcoholic beverages -Take any medication unless instructed by your physician -Make any legal decisions or sign important papers.  Meals: Start with liquid foods such as gelatin or soup. Progress to regular foods as tolerated. Avoid greasy, spicy, heavy foods. If nausea and/or vomiting occur, drink only clear liquids until the nausea and/or vomiting subsides. Call your physician if vomiting continues.  Special Instructions/Symptoms: Your throat may feel dry  or sore from the anesthesia or the breathing tube placed in your throat during surgery. If this causes discomfort, gargle with warm salt water. The discomfort should disappear within 24 hours.  If you had a scopolamine patch placed behind your ear for the management of post- operative nausea and/or vomiting:  1. The medication in the patch is effective for 72 hours, after which it should be removed.  Wrap patch in a tissue and discard in the trash. Wash hands thoroughly with soap and water. 2. You may remove the patch earlier than 72 hours if you experience unpleasant side effects which may include dry mouth, dizziness or visual disturbances. 3. Avoid touching the patch. Wash your hands with soap and water after contact with the patch.     No tylenol or advil until after 1pm today.

## 2019-07-17 ENCOUNTER — Encounter: Payer: Self-pay | Admitting: *Deleted

## 2019-07-17 LAB — SURGICAL PATHOLOGY

## 2019-08-01 ENCOUNTER — Ambulatory Visit (INDEPENDENT_AMBULATORY_CARE_PROVIDER_SITE_OTHER): Payer: Medicaid Other | Admitting: Obstetrics and Gynecology

## 2019-08-01 ENCOUNTER — Other Ambulatory Visit: Payer: Self-pay

## 2019-08-01 ENCOUNTER — Encounter: Payer: Self-pay | Admitting: Obstetrics and Gynecology

## 2019-08-01 VITALS — BP 127/89 | HR 71 | Ht 65.0 in | Wt 162.3 lb

## 2019-08-01 DIAGNOSIS — Z09 Encounter for follow-up examination after completed treatment for conditions other than malignant neoplasm: Secondary | ICD-10-CM

## 2019-08-01 DIAGNOSIS — F329 Major depressive disorder, single episode, unspecified: Secondary | ICD-10-CM

## 2019-08-01 DIAGNOSIS — F32A Depression, unspecified: Secondary | ICD-10-CM

## 2019-08-01 NOTE — Progress Notes (Signed)
Ms Jordon presents for post op follow up. S/P Excision of chronic right bartholin cyst on 07/16/19 Pathology reviewed with pt Doing well over all A little pain and some vaginal discharge Denies bowel or bladder dysfunction  PE AF  VSS Lungs clear Heart RRR abd soft + BS GU incision site healing, lose sutures were removed  A/P Post op appt        Elevated depression score  Continue with pelvic rest. Will make appt to see Roselyn Reef. F/U with me in 4 weeks

## 2019-08-05 NOTE — BH Specialist Note (Signed)
Integrated Behavioral Health via Telemedicine Video Visit  08/05/2019 TORA HEMKER [id number]  Pt did not arrive to video visit and did not answer the phone ; Left HIPPA-compliant message to call back Roselyn Reef from [facility] [employer] at [facility] at [phone] (main office) or [phone] ([place] office).  ; left MyChart message for patient.    Caroleen Hamman [facility]  Depression screen [facility] 2/9 08/01/2019 01/18/2019 11/05/2018 05/04/2018 01/31/2018  Decreased Interest 2 3 0 3 3  Down, Depressed, Hopeless 2 3 3 3 3   PHQ - 2 Score 4 6 3 6 6   Altered sleeping 2 3 3 3 3   Tired, decreased energy 2 3 3 2 3   Change in appetite 2 3 3 3 3   Feeling bad or failure about yourself  2 3 3 3 3   Trouble concentrating 2 3 3 3 3   Moving slowly or fidgety/restless 2 3 0 0 3  Suicidal thoughts 2 3 0 2 2  PHQ-9 Score 18 27 18 22 26   Some recent data might be hidden  ' GAD 7 : Generalized Anxiety Score 08/01/2019 01/18/2019 11/05/2018 05/04/2018  Nervous, Anxious, on Edge 2 3 3 3   Control/stop worrying 2 3 3 3   Worry too much - different things 2 3 3 3   Trouble relaxing 2 3 1 2   Restless 2 3 0 3  Easily annoyed or irritable 2 3 2 3   Afraid - awful might happen 2 3 0 3  Total GAD 7 Score 14 21 12  20

## 2019-08-15 ENCOUNTER — Other Ambulatory Visit: Payer: Self-pay

## 2019-08-15 ENCOUNTER — Ambulatory Visit: Payer: Medicaid Other | Admitting: Clinical

## 2019-08-15 DIAGNOSIS — Z5329 Procedure and treatment not carried out because of patient's decision for other reasons: Secondary | ICD-10-CM

## 2019-08-15 DIAGNOSIS — Z91199 Patient's noncompliance with other medical treatment and regimen due to unspecified reason: Secondary | ICD-10-CM

## 2019-08-28 ENCOUNTER — Telehealth: Payer: Self-pay

## 2019-08-28 NOTE — Telephone Encounter (Signed)
Patient called back, advised pt [facility], provided number.

## 2019-08-28 NOTE — Telephone Encounter (Signed)
Called pt no answer, left vm  

## 2019-08-28 NOTE — Telephone Encounter (Signed)
Pt called requesting a call back from [place].  Per chart review, clinical staff from [place] reached to patient.  I left message encouraging to call the [phone] to speak with [place] and that she had also sent a MyChart message.    Mel Almond, RN

## 2019-09-04 ENCOUNTER — Ambulatory Visit (INDEPENDENT_AMBULATORY_CARE_PROVIDER_SITE_OTHER): Payer: Medicaid Other | Admitting: Obstetrics and Gynecology

## 2019-09-04 ENCOUNTER — Other Ambulatory Visit: Payer: Self-pay

## 2019-09-04 ENCOUNTER — Encounter: Payer: Self-pay | Admitting: Obstetrics and Gynecology

## 2019-09-04 ENCOUNTER — Other Ambulatory Visit (HOSPITAL_COMMUNITY)
Admission: RE | Admit: 2019-09-04 | Discharge: 2019-09-04 | Disposition: A | Discharge status: Home / Self Care | Payer: Medicaid Other | Source: Ambulatory Visit | Attending: Obstetrics and Gynecology | Admitting: Obstetrics and Gynecology

## 2019-09-04 VITALS — BP 117/80 | HR 67 | Ht 65.0 in | Wt 160.6 lb

## 2019-09-04 DIAGNOSIS — N898 Other specified noninflammatory disorders of vagina: Secondary | ICD-10-CM | POA: Insufficient documentation

## 2019-09-04 DIAGNOSIS — Z9889 Other specified postprocedural states: Secondary | ICD-10-CM | POA: Insufficient documentation

## 2019-09-04 NOTE — Progress Notes (Signed)
Kendra Griffith presents for post op visit from excision of batholin cyst 07/16/19.\ She missed her 4 week post op appt. She reports having vaginal discharge a week ago. Not as severe now. She denies any bowel of bladder dysfunction Pathology reviewed with pt  PE AF VSS Lungs clear Heart RRR Abd  Soft + BS GU NL EGBUS excision site well healed, no evidence of drainage, scant vaginal discharge noted, swab collected   A/P Post OP visit         Vaginal discharge  Await swab results. Otherwise pt is cleared to return to all pre op activities. F/U PRN

## 2019-09-04 NOTE — Patient Instructions (Signed)
Health Maintenance, Female Adopting a healthy lifestyle and getting preventive care are important in promoting health and wellness. Ask your health care provider about:  The right schedule for you to have regular tests and exams.  Things you can do on your own to prevent diseases and keep yourself healthy. What should I know about diet, weight, and exercise? Eat a healthy diet   Eat a diet that includes plenty of vegetables, fruits, low-fat dairy products, and lean protein.  Do not eat a lot of foods that are high in solid fats, added sugars, or sodium. Maintain a healthy weight Body mass index (BMI) is used to identify weight problems. It estimates body fat based on height and weight. Your health care provider can help determine your BMI and help you achieve or maintain a healthy weight. Get regular exercise Get regular exercise. This is one of the most important things you can do for your health. Most adults should:  Exercise for at least 150 minutes each week. The exercise should increase your heart rate and make you sweat (moderate-intensity exercise).  Do strengthening exercises at least twice a week. This is in addition to the moderate-intensity exercise.  Spend less time sitting. Even light physical activity can be beneficial. Watch cholesterol and blood lipids Have your blood tested for lipids and cholesterol at 54 years of age, then have this test every 5 years. Have your cholesterol levels checked more often if:  Your lipid or cholesterol levels are high.  You are older than 54 years of age.  You are at high risk for heart disease. What should I know about cancer screening? Depending on your health history and family history, you may need to have cancer screening at various ages. This may include screening for:  Breast cancer.  Cervical cancer.  Colorectal cancer.  Skin cancer.  Lung cancer. What should I know about heart disease, diabetes, and high blood  pressure? Blood pressure and heart disease  High blood pressure causes heart disease and increases the risk of stroke. This is more likely to develop in people who have high blood pressure readings, are of African descent, or are overweight.  Have your blood pressure checked: ? Every 3-5 years if you are 18-39 years of age. ? Every year if you are 40 years old or older. Diabetes Have regular diabetes screenings. This checks your fasting blood sugar level. Have the screening done:  Once every three years after age 40 if you are at a normal weight and have a low risk for diabetes.  More often and at a younger age if you are overweight or have a high risk for diabetes. What should I know about preventing infection? Hepatitis B If you have a higher risk for hepatitis B, you should be screened for this virus. Talk with your health care provider to find out if you are at risk for hepatitis B infection. Hepatitis C Testing is recommended for:  Everyone born from 1945 through 1965.  Anyone with known risk factors for hepatitis C. Sexually transmitted infections (STIs)  Get screened for STIs, including gonorrhea and chlamydia, if: ? You are sexually active and are younger than 54 years of age. ? You are older than 54 years of age and your health care provider tells you that you are at risk for this type of infection. ? Your sexual activity has changed since you were last screened, and you are at increased risk for chlamydia or gonorrhea. Ask your health care provider if   you are at risk.  Ask your health care provider about whether you are at high risk for HIV. Your health care provider may recommend a prescription medicine to help prevent HIV infection. If you choose to take medicine to prevent HIV, you should first get tested for HIV. You should then be tested every 3 months for as long as you are taking the medicine. Pregnancy  If you are about to stop having your period (premenopausal) and  you may become pregnant, seek counseling before you get pregnant.  Take 400 to 800 micrograms (mcg) of folic acid every day if you become pregnant.  Ask for birth control (contraception) if you want to prevent pregnancy. Osteoporosis and menopause Osteoporosis is a disease in which the bones lose minerals and strength with aging. This can result in bone fractures. If you are 65 years old or older, or if you are at risk for osteoporosis and fractures, ask your health care provider if you should:  Be screened for bone loss.  Take a calcium or vitamin D supplement to lower your risk of fractures.  Be given hormone replacement therapy (HRT) to treat symptoms of menopause. Follow these instructions at home: Lifestyle  Do not use any products that contain nicotine or tobacco, such as cigarettes, e-cigarettes, and chewing tobacco. If you need help quitting, ask your health care provider.  Do not use street drugs.  Do not share needles.  Ask your health care provider for help if you need support or information about quitting drugs. Alcohol use  Do not drink alcohol if: ? Your health care provider tells you not to drink. ? You are pregnant, may be pregnant, or are planning to become pregnant.  If you drink alcohol: ? Limit how much you use to 0-1 drink a day. ? Limit intake if you are breastfeeding.  Be aware of how much alcohol is in your drink. In the [place] one drink equals one 12 oz bottle of beer (355 mL), one 5 oz glass of wine (148 mL), or one 1 oz glass of hard liquor (44 mL). General instructions  Schedule regular health, dental, and eye exams.  Stay current with your vaccines.  Tell your health care provider if: ? You often feel depressed. ? You have ever been abused or do not feel safe at home. Summary  Adopting a healthy lifestyle and getting preventive care are important in promoting health and wellness.  Follow your health care provider's instructions about healthy  diet, exercising, and getting tested or screened for diseases.  Follow your health care provider's instructions on monitoring your cholesterol and blood pressure. This information is not intended to replace advice given to you by your health care provider. Make sure you discuss any questions you have with your health care provider. Document Revised: 03/21/2018 Document Reviewed: 03/21/2018 Elsevier Patient Education  2020 Elsevier Inc.  

## 2019-09-05 LAB — CERVICOVAGINAL ANCILLARY ONLY
Bacterial Vaginitis (gardnerella): NEGATIVE
Candida Glabrata: NEGATIVE
Candida Vaginitis: NEGATIVE
Chlamydia: NEGATIVE
Comment: NEGATIVE
Comment: NEGATIVE
Comment: NEGATIVE
Comment: NEGATIVE
Comment: NEGATIVE
Comment: NORMAL
Neisseria Gonorrhea: NEGATIVE
Trichomonas: NEGATIVE

## 2019-09-05 NOTE — BH Specialist Note (Signed)
Pt did not arrive to video visit and did not answer the phone ; Left HIPPA-compliant message to call back Kendra Griffith from [facility] [employer] at [facility] at [phone] (main office) or [phone] ([place] office); left MyChart message for patient.     Integrated Behavioral Health via Telemedicine Video Visit  09/05/2019 Kendra Griffith [id number]   [name]  Depression screen [facility] 2/9 09/04/2019 08/01/2019 01/18/2019 11/05/2018 05/04/2018  Decreased Interest 2 2 3  0 3  Down, Depressed, Hopeless 2 2 3 3 3   PHQ - 2 Score 4 4 6 3 6   Altered sleeping 2 2 3 3 3   Tired, decreased energy 2 2 3 3 2   Change in appetite 2 2 3 3 3   Feeling bad or failure about yourself  2 2 3 3 3   Trouble concentrating 2 2 3 3 3   Moving slowly or fidgety/restless 2 2 3  0 0  Suicidal thoughts 2 2 3  0 2  PHQ-9 Score 18 18 27 18 22   Some recent data might be hidden   GAD 7 : Generalized Anxiety Score 09/04/2019 08/01/2019 01/18/2019 11/05/2018  Nervous, Anxious, on Edge 2 2 3 3   Control/stop worrying 2 2 3 3   Worry too much - different things 2 2 3 3   Trouble relaxing 2 2 3 1   Restless 2 2 3  0  Easily annoyed or irritable 2 2 3 2   Afraid - awful might happen 2 2 3  0  Total GAD 7 Score 14 14 21  12

## 2019-09-11 ENCOUNTER — Ambulatory Visit: Payer: Medicaid Other | Admitting: Clinical

## 2019-09-11 DIAGNOSIS — Z91199 Patient's noncompliance with other medical treatment and regimen due to unspecified reason: Secondary | ICD-10-CM

## 2019-09-18 ENCOUNTER — Ambulatory Visit: Payer: Medicaid Other | Admitting: Podiatry

## 2019-09-20 IMAGING — DX DG CHEST 2V
2 series · 2 of 2 positions shown · non-contrast
Comparison: 11/17/2009

CLINICAL DATA: Posttraumatic chest pain.

EXAM:
CHEST - 2 VIEW

[chest pa]
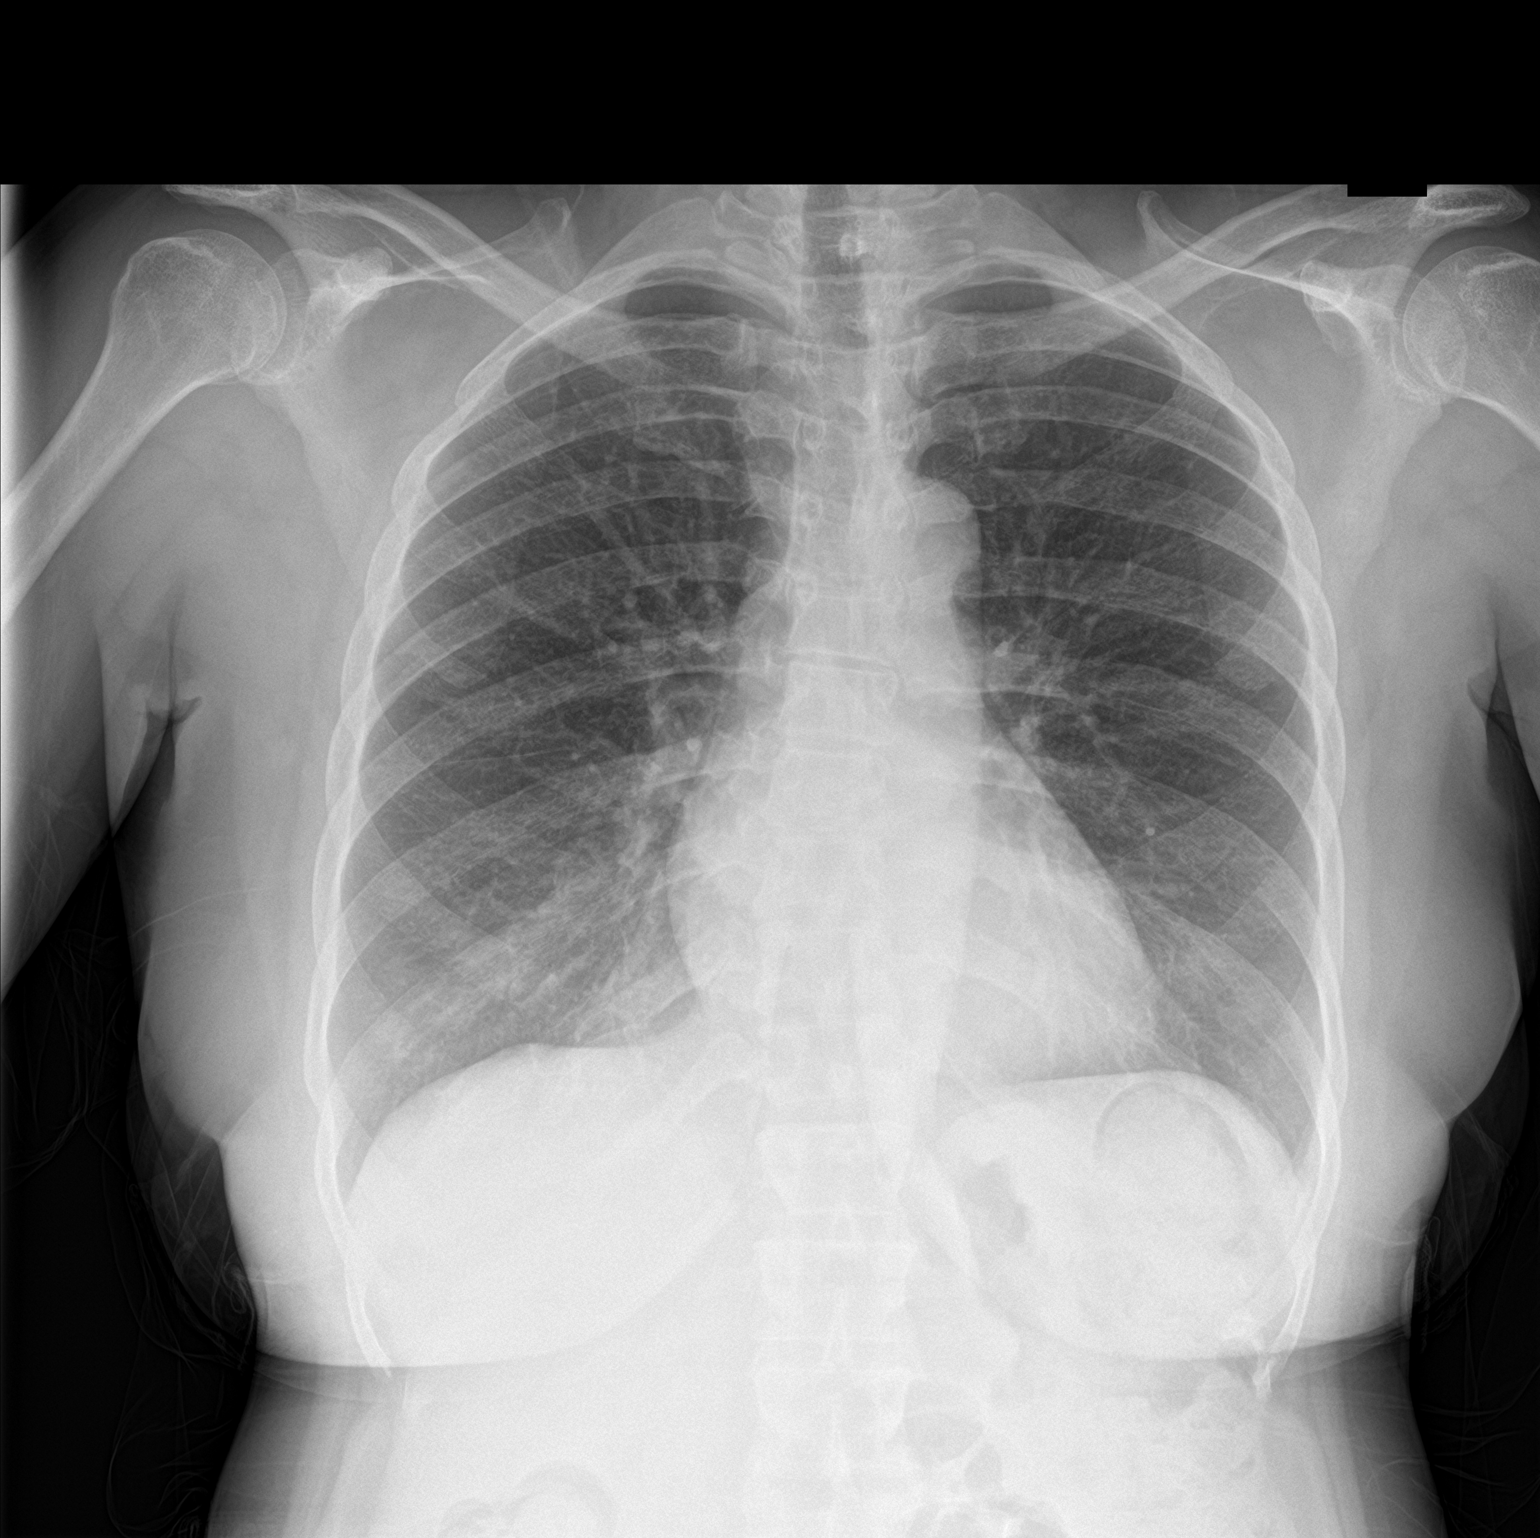

[chest lat]
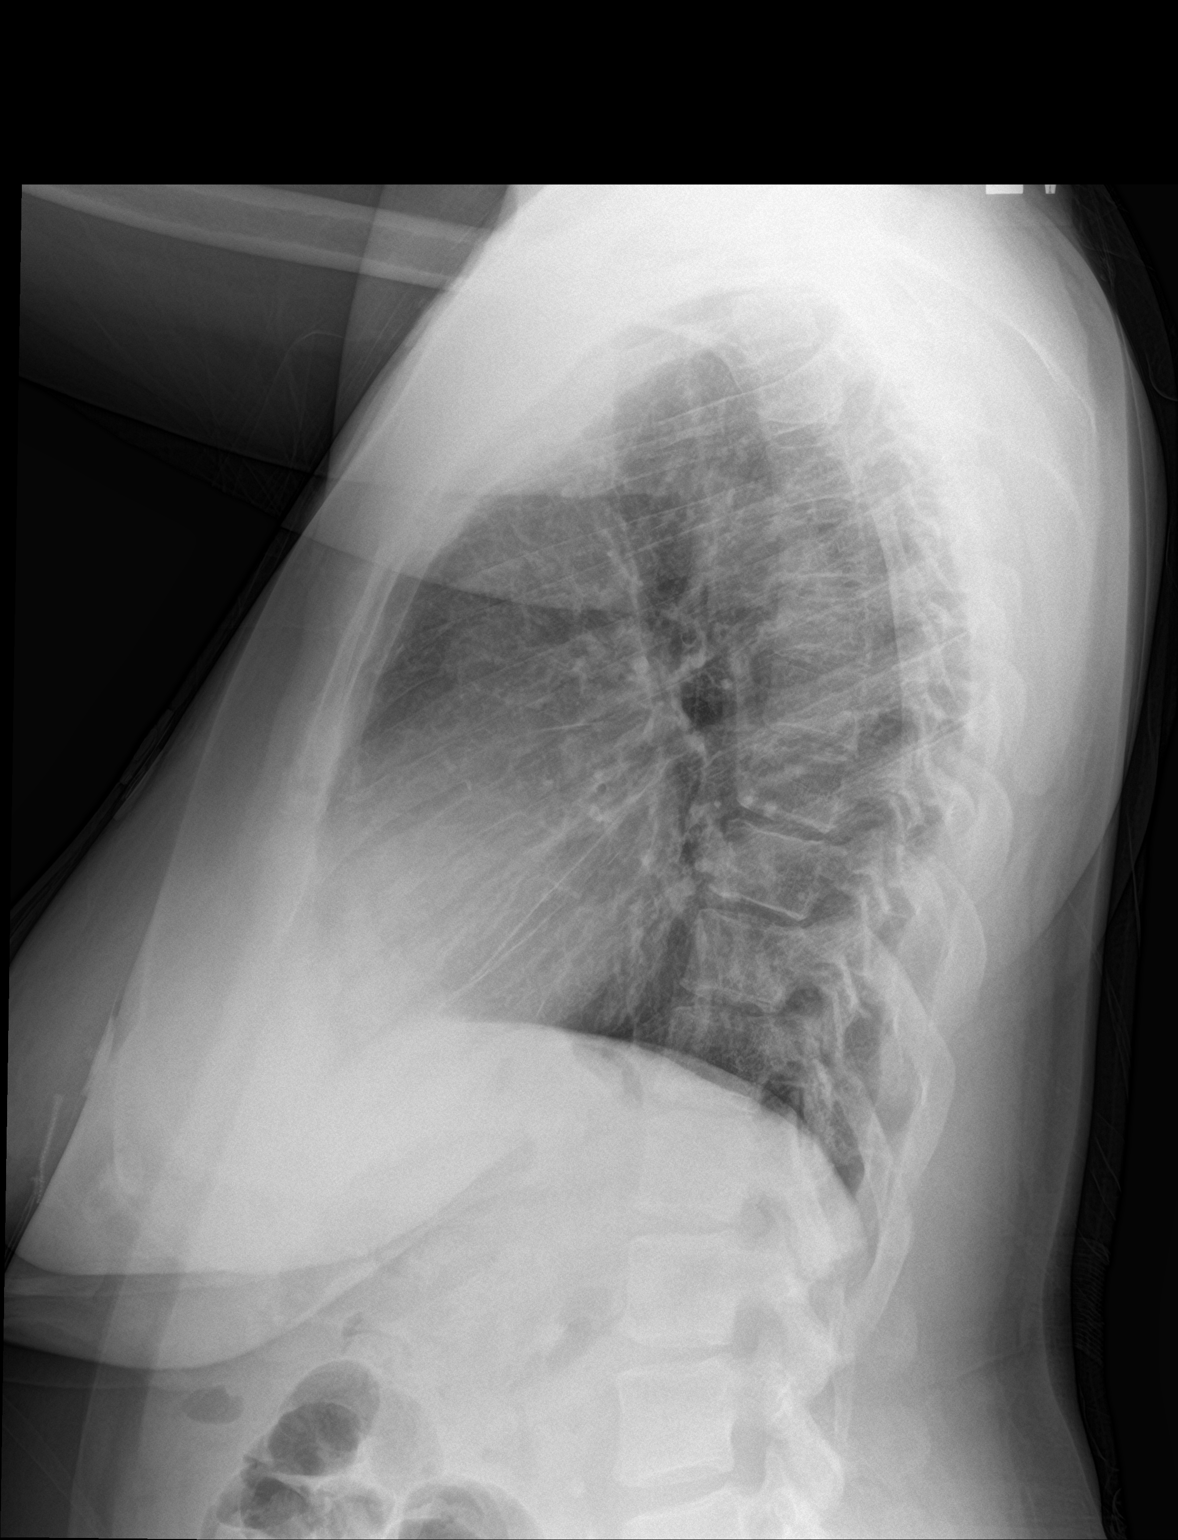

[2 of 2 positions shown; findings below may reference images not displayed]

FINDINGS: Normal heart size and mediastinal contours. No acute infiltrate or
edema. No effusion or pneumothorax. No acute osseous findings.
IMPRESSION: Negative chest.

## 2019-09-24 ENCOUNTER — Encounter: Payer: Self-pay | Admitting: Nurse Practitioner

## 2019-09-24 ENCOUNTER — Encounter: Payer: Self-pay | Admitting: Podiatry

## 2019-09-24 NOTE — Telephone Encounter (Signed)
Lvm for patient to call the office back and schedule a sooner appt. Current availability for tomorrow 09/25/19 w/ Dr.G.

## 2019-09-27 NOTE — Telephone Encounter (Signed)
CT Lung scan was denied by Medicaid.

## 2019-10-02 ENCOUNTER — Ambulatory Visit (INDEPENDENT_AMBULATORY_CARE_PROVIDER_SITE_OTHER): Payer: Medicaid Other | Admitting: Podiatry

## 2019-10-02 ENCOUNTER — Ambulatory Visit (INDEPENDENT_AMBULATORY_CARE_PROVIDER_SITE_OTHER): Payer: Medicaid Other

## 2019-10-02 ENCOUNTER — Encounter: Payer: Self-pay | Admitting: Podiatry

## 2019-10-02 ENCOUNTER — Other Ambulatory Visit: Payer: Self-pay

## 2019-10-02 ENCOUNTER — Other Ambulatory Visit: Payer: Self-pay | Admitting: Podiatry

## 2019-10-02 VITALS — Temp 97.7°F

## 2019-10-02 DIAGNOSIS — M7751 Other enthesopathy of right foot: Secondary | ICD-10-CM | POA: Diagnosis not present

## 2019-10-02 DIAGNOSIS — M79675 Pain in left toe(s): Secondary | ICD-10-CM | POA: Diagnosis not present

## 2019-10-02 DIAGNOSIS — M79674 Pain in right toe(s): Secondary | ICD-10-CM

## 2019-10-02 DIAGNOSIS — B351 Tinea unguium: Secondary | ICD-10-CM

## 2019-10-02 DIAGNOSIS — M7752 Other enthesopathy of left foot: Secondary | ICD-10-CM

## 2019-10-02 DIAGNOSIS — M792 Neuralgia and neuritis, unspecified: Secondary | ICD-10-CM | POA: Diagnosis not present

## 2019-10-02 DIAGNOSIS — M778 Other enthesopathies, not elsewhere classified: Secondary | ICD-10-CM

## 2019-10-02 DIAGNOSIS — M79672 Pain in left foot: Secondary | ICD-10-CM | POA: Diagnosis not present

## 2019-10-02 DIAGNOSIS — M79671 Pain in right foot: Secondary | ICD-10-CM

## 2019-10-02 DIAGNOSIS — E1142 Type 2 diabetes mellitus with diabetic polyneuropathy: Secondary | ICD-10-CM

## 2019-10-02 MED ORDER — NONFORMULARY OR COMPOUNDED ITEM: 3 refills | Status: DC

## 2019-10-02 NOTE — Patient Instructions (Addendum)
[place] [phone] Neuropathy cream  Diabetes Mellitus and Foot Care Foot care is an important part of your health, especially when you have diabetes. Diabetes may cause you to have problems because of poor blood flow (circulation) to your feet and legs, which can cause your skin to:  Become thinner and drier.  Break more easily.  Heal more slowly.  Peel and crack. You may also have nerve damage (neuropathy) in your legs and feet, causing decreased feeling in them. This means that you may not notice minor injuries to your feet that could lead to more serious problems. Noticing and addressing any potential problems early is the best way to prevent future foot problems. How to care for your feet Foot hygiene  Wash your feet daily with warm water and mild soap. Do not use hot water. Then, pat your feet and the areas between your toes until they are completely dry. Do not soak your feet as this can dry your skin.  Trim your toenails straight across. Do not dig under them or around the cuticle. File the edges of your nails with an emery board or nail file.  Apply a moisturizing lotion or petroleum jelly to the skin on your feet and to dry, brittle toenails. Use lotion that does not contain alcohol and is unscented. Do not apply lotion between your toes. Shoes and socks  Wear clean socks or stockings every day. Make sure they are not too tight. Do not wear knee-high stockings since they may decrease blood flow to your legs.  Wear shoes that fit properly and have enough cushioning. Always look in your shoes before you put them on to be sure there are no objects inside.  To break in new shoes, wear them for just a few hours a day. This prevents injuries on your feet. Wounds, scrapes, corns, and calluses  Check your feet daily for blisters, cuts, bruises, sores, and redness. If you cannot see the bottom of your feet, use a mirror or ask someone for help.  Do not cut corns or  calluses or try to remove them with medicine.  If you find a minor scrape, cut, or break in the skin on your feet, keep it and the skin around it clean and dry. You may clean these areas with mild soap and water. Do not clean the area with peroxide, alcohol, or iodine.  If you have a wound, scrape, corn, or callus on your foot, look at it several times a day to make sure it is healing and not infected. Check for: ? Redness, swelling, or pain. ? Fluid or blood. ? Warmth. ? Pus or a bad smell. General instructions  Do not cross your legs. This may decrease blood flow to your feet.  Do not use heating pads or hot water bottles on your feet. They may burn your skin. If you have lost feeling in your feet or legs, you may not know this is happening until it is too late.  Protect your feet from hot and cold by wearing shoes, such as at the beach or on hot pavement.  Schedule a complete foot exam at least once a year (annually) or more often if you have foot problems. If you have foot problems, report any cuts, sores, or bruises to your health care provider immediately. Contact a health care provider if:  You have a medical condition that increases your risk of infection and you have any cuts, sores, or bruises on your feet.  You  have an injury that is not healing.  You have redness on your legs or feet.  You feel burning or tingling in your legs or feet.  You have pain or cramps in your legs and feet.  Your legs or feet are numb.  Your feet always feel cold.  You have pain around a toenail. Get help right away if:  You have a wound, scrape, corn, or callus on your foot and: ? You have pain, swelling, or redness that gets worse. ? You have fluid or blood coming from the wound, scrape, corn, or callus. ? Your wound, scrape, corn, or callus feels warm to the touch. ? You have pus or a bad smell coming from the wound, scrape, corn, or callus. ? You have a fever. ? You have a red line  going up your leg. Summary  Check your feet every day for cuts, sores, red spots, swelling, and blisters.  Moisturize feet and legs daily.  Wear shoes that fit properly and have enough cushioning.  If you have foot problems, report any cuts, sores, or bruises to your health care provider immediately.  Schedule a complete foot exam at least once a year (annually) or more often if you have foot problems. This information is not intended to replace advice given to you by your health care provider. Make sure you discuss any questions you have with your health care provider. Document Revised: 12/19/2018 Document Reviewed: 04/29/2016 Elsevier Patient Education  [place].

## 2019-10-02 NOTE — Progress Notes (Signed)
Subjective: Kendra Griffith presents today at risk foot care with history of peripheral neuropathy and painful porokeratotic lesion(s) plantarly b/l and painful mycotic toenails b/l that limit ambulation. Aggravating factors include weightbearing with and without shoe gear. Pain for both is relieved with periodic professional debridement. She missed her last appointment with [place] due to having her daughter in town; also had to get her COVID-19 vaccine.  Patient states she continues to have pain in both feet, described as burning, numbness and tingling. She states gabapentin is not working. She has seen Neurology in the past and declined pain management referral. She is apprehensive about pain medicine because her sister is addicted to opioids.   Kendra Pounds, NP is patient's PCP. Last visit was: 02/15/2019.  Past Medical History:  Diagnosis Date  . ABSCESS, TOOTH 08/20/2009   Qualifier: Diagnosis of  By: Jorene Minors, Scott    . Anemia   . Anxiety   . Bipolar disorder ([place])   . Blood transfusion without reported diagnosis    childbirth  . Chronic pain    feet and back  . Clotting disorder ([place])    childbirth  . Dental caries    lesion soft palate  . Depression   . Diabetes mellitus without complication (HCC)    prediabetes  . GERD (gastroesophageal reflux disease)   . Hypertension   . Injury    right side from jumping off bunkbed  . Pre-diabetes   . Sickle cell trait ([place])   . Wears glasses      Current Outpatient Medications on File Prior to Visit  Medication Sig Dispense Refill  . acetaminophen-codeine (TYLENOL #3) 300-30 MG tablet Take 1 tablet by mouth every 8 (eight) hours as needed for moderate pain or severe pain. 60 tablet 0  . atorvastatin (LIPITOR) 20 MG tablet Take 1 tablet (20 mg total) by mouth daily. 90 tablet 3  . Biotin 5 MG CAPS Take 5 mg by mouth 3 (three) times daily.    . cholecalciferol (VITAMIN D3) 25 MCG (1000 UT) tablet Take 1,000 Units by mouth daily.     . ferrous sulfate 324 MG TBEC Take 324 mg by mouth.    Marland Kitchen GLUTAMINE PO Take 500 mg by mouth.    . hydrochlorothiazide (HYDRODIURIL) 25 MG tablet TAKE 1 TABLET (25 MG TOTAL) BY MOUTH DAILY. 90 tablet 3  . hydrOXYzine (ATARAX/VISTARIL) 25 MG tablet Take 1 tablet (25 mg total) by mouth 3 (three) times daily as needed. 60 tablet 1  . ibuprofen (ADVIL) 800 MG tablet TAKE 1 TABLET BY MOUTH EVERY 8 HOURS AS NEEDED. 60 tablet 2  . Multiple Vitamin (MULTI-VITAMIN) tablet Take by mouth.    . Omega-3 Fatty Acids (OMEGA 3 500) 500 MG CAPS Take 500 mg by mouth daily.    . vitamin B-12 (CYANOCOBALAMIN) 500 MCG tablet Take 500 mcg by mouth daily.    . vitamin C (ASCORBIC ACID) 500 MG tablet Take 500 mg by mouth daily.    Marland Kitchen amitriptyline (ELAVIL) 50 MG tablet Take 1 tablet (50 mg total) by mouth at bedtime. 30 tablet 3   No current facility-administered medications on file prior to visit.     Allergies  Allergen Reactions  . Amoxicillin     Pain Did it involve swelling of the face/tongue/throat, SOB, or low BP? No Did it involve sudden or severe rash/hives, skin peeling, or any reaction on the inside of your mouth or nose? No Did you need to seek medical attention at  a hospital or doctor's office? Yes When did it last happen?5 years ago If all above answers are "NO", may proceed with cephalosporin use.   Kendra Griffith [Kendra Griffith] Nausea And Vomiting    Makes her jittery  . Hydrocodone-Acetaminophen Nausea And Vomiting    Upset stomach  . Percocet [Oxycodone-Acetaminophen]     Makes her jittery  . Valium [Diazepam] Other (See Comments)    Makes pt. Feel "out of wack"    Objective: Kendra Griffith is a pleasant 54 y.o. female WD, WN in NAD. AAO x 3.  Vitals:   10/02/19 0908  Temp: 97.7 F (36.5 C)   Vascular Examination: Neurovascular status unchanged b/l lower extremities. Capillary fill time to digits <3 seconds b/l lower extremities. Palpable pedal pulses b/l LE.  Pedal hair present. Lower extremity skin temperature gradient within normal limits.  Dermatological Examination: Pedal skin with normal turgor, texture and tone bilaterally. No open wounds bilaterally. No interdigital macerations bilaterally. Toenails 1-5 b/l elongated, discolored, dystrophic, thickened, crumbly with subungual debris and tenderness to dorsal palpation. Porokeratotic lesion(s) submet head 1 right foot, submet head 2 left foot, submet head 5 left foot and submet head 5 right foot. No erythema, no edema, no drainage, no flocculence.  Musculoskeletal: Normal muscle strength 5/5 to all lower extremity muscle groups bilaterally. No pain crepitus or joint limitation noted with ROM b/l. No gross bony deformities bilaterally.  Neurological Examination: Protective sensation decreased with 10 gram monofilament b/l.   Xrays: b/l feet No evidence of fracture b/l No gas in tissues No edema b/l Screw fixation intact right 5th metatarsal head Pin fixation intact right 1st metatarsal head Screw fixation intact left 5th metatarsal head Screw fixation intact left 1st metatarsal head Last A1c: Hemoglobin A1C Latest Ref Rng & Units 11/27/2018  HGBA1C 4.8 - 5.6 % 6.3(H)  Some recent data might be hidden   Assessment: 1. Pain in right foot   2. Pain in left foot    Plan: -Examined patient. -Continue diabetic foot care principles. -Xrays taken and reviewed in office with Kendra Griffith. -Toenails 1-5 b/l were debrided in length and girth with sterile nail nippers and dremel without iatrogenic bleeding.  -As a courtesy, painful porokeratotic lesion(s) submet head 1 right foot, submet head 2 left foot, submet head 5 left foot and submet head 5 right foot pared and enucleated with sterile scalpel blade without incident. -Patient to report any pedal injuries to medical professional immediately. -Discussed neuropathic pain. We discussed topical medication and she would like to try it. A  prescription was sent to [facility] for peripheral neuropathy cream which consists of: Bupivacaine 1%, doxepin 3%, gabapentin 6%, pentoxifylline 3%, and Topimarate 1%. Apply 1-2 grams to feet 3-4 times daily as needed for foot pain. -Patient to continue soft, supportive shoe gear daily. -Patient/POA to call should there be question/concern in the interim.  Return in about 9 weeks (around 12/04/2019) for diabetic nail trim, neuropathic pain.  Marzetta Board, DPM
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
## 2019-10-17 ENCOUNTER — Telehealth: Payer: Self-pay | Admitting: Nurse Practitioner

## 2019-10-17 ENCOUNTER — Other Ambulatory Visit: Payer: Self-pay | Admitting: Nurse Practitioner

## 2019-10-17 DIAGNOSIS — I10 Essential (primary) hypertension: Secondary | ICD-10-CM

## 2019-10-17 MED ORDER — HYDROCHLOROTHIAZIDE 25 MG PO TABS: 25.0000 mg | ORAL_TABLET | Freq: Every day | ORAL | 0 refills | Status: DC

## 2019-10-17 NOTE — Telephone Encounter (Signed)
Returned call to patient, no answer. Left message for patient to return call. Clarification needed on the name of the medication patient was previously prescribed for acid reflux, not on current med list.  Patient will need to be scheduled for appt for med refills. Patient will also need to be informed of information regarding CT in other telephone encounter on 10/17/19.

## 2019-10-17 NOTE — Telephone Encounter (Signed)
Attempt to reach patient back to inform currently waiting for medical approval for CT chest scan. No answer and LVM. CMA also sent a MyChart message.

## 2019-10-17 NOTE — Telephone Encounter (Signed)
hydrochlorothiazide (HYDRODIURIL) 25 MG tablet  acetaminophen-codeine (TYLENOL #3) 300-30 MG tablet  [place] - [address] Phone:  [phone]  Fax:  [phone]     Additionally pt also wanted the med she was taking for acid reflux. States that the dr had discontinued in March or April as there was issue with drug, pt did not think it was ever filled  upon questioning  GLUTAMINE PO but states thinks never got filled so never thinks she may have not  knew. Told pt would receive nurse CB at [phone]

## 2019-10-17 NOTE — Telephone Encounter (Signed)
Please f/u   Copied from [place] [phone]. Topic: General - Other >> Oct 17, 2019  2:50 PM Keene Breath wrote: Reason for CRM: Patient stated that she is returning a call she got regarding scheduling for a chest xray.  Patient said she looked through my chart and did not see a message either.  Please call patient to discuss at [phone]

## 2019-10-17 NOTE — Telephone Encounter (Signed)
Requested medication (s) are due for refill today: yes  Requested medication (s) are on the active medication list: yes  Last refill:  Glutamine listed as historical med, Tylenol #3 last filled on 05/30/19 #60  Future visit scheduled: no.   Notes to clinic:  Please review for refill. Tylenol #3 not delegated for refill per protocol. Glutamine last filled by historical provider. Medication for acid reflux not on current med list    Requested Prescriptions  Pending Prescriptions Disp Refills   Glutamine 500 MG TABS  0    Sig: Take 1 tablet (500 mg total) by mouth.      Off-Protocol Failed - 10/17/2019  4:46 PM      Failed - Medication not assigned to a protocol, review manually.      Passed - Valid encounter within last 12 months    Recent Outpatient Visits           8 months ago Anxiety and depression   [place], Vernia Buff, NP   9 months ago Anxiety and depression   [place], [place] W, NP   11 months ago Controlled type 2 diabetes mellitus with complication, without long-term current use of insulin [facility])   [place], [place] W, NP   1 year ago Diabetes mellitus type 2, uncontrolled, with complications [facility])   [place], Vernia Buff, NP   1 year ago Well woman exam without gynecological exam   [place], [place] W, NP                acetaminophen-codeine (TYLENOL #3) 300-30 MG tablet 60 tablet 0    Sig: Take 1 tablet by mouth every 8 (eight) hours as needed for moderate pain or severe pain.      Not Delegated - Analgesics:  Opioid Agonist Combinations Failed - 10/17/2019  4:46 PM      Failed - This refill cannot be delegated      Failed - Urine Drug Screen completed in last 360 days.      Failed - Valid encounter within last 6 months    Recent Outpatient Visits           8 months  ago Anxiety and depression   [place], Vernia Buff, NP   9 months ago Anxiety and depression   [place], [place] W, NP   11 months ago Controlled type 2 diabetes mellitus with complication, without long-term current use of insulin [facility])   [place], [place] W, NP   1 year ago Diabetes mellitus type 2, uncontrolled, with complications [facility])   [place], [place] W, NP   1 year ago Well woman exam without gynecological exam   [place], Vernia Buff, NP               Signed Prescriptions Disp Refills   hydrochlorothiazide (HYDRODIURIL) 25 MG tablet 30 tablet 0    Sig: Take 1 tablet (25 mg total) by mouth daily. Patient needs OV for additional refills      Cardiovascular: Diuretics - Thiazide Failed - 10/17/2019  4:46 PM      Failed - Valid encounter within last 6 months    Recent Outpatient Visits  8 months ago Anxiety and depression   [place], Vernia Buff, NP   9 months ago Anxiety and depression   [place], [place] W, NP   11 months ago Controlled type 2 diabetes mellitus with complication, without long-term current use of insulin [facility])   [place], Vernia Buff, NP   1 year ago Diabetes mellitus type 2, uncontrolled, with complications [facility])   [place], Zelda W, NP   1 year ago Well woman exam without gynecological exam   [place], [place] W, NP              Passed - Ca in normal range and within 360 days    Calcium  Date Value Ref Range Status  07/15/2019 9.6 8.9 - 10.3 mg/dL Final   Calcium, Ion  Date Value Ref Range Status  10/27/2010 1.02 (L) 1.12 - 1.32 mmol/L Final           Passed - Cr in normal range and within 360 days    Creat  Date Value Ref Range Status  01/21/2016 0.83 0.50 - 1.05 mg/dL Final    Comment:      For patients > or = 54 years of age: The upper reference limit for Creatinine is approximately 13% higher for people identified as African-American.      Creatinine, Ser  Date Value Ref Range Status  07/15/2019 0.83 0.44 - 1.00 mg/dL Final   Creatinine,U  Date Value Ref Range Status  07/30/2009 73.1 mg/dL Final    Comment:    See lab report for associated comment(s)   Creatinine, Urine  Date Value Ref Range Status  01/21/2016 166 20 - 320 mg/dL Final          Passed - K in normal range and within 360 days    Potassium  Date Value Ref Range Status  07/15/2019 4.1 3.5 - 5.1 mmol/L Final          Passed - Na in normal range and within 360 days    Sodium  Date Value Ref Range Status  07/15/2019 141 135 - 145 mmol/L Final  11/27/2018 141 134 - 144 mmol/L Final          Passed - Last BP in normal range    BP Readings from Last 1 Encounters:  09/04/19 117/80

## 2019-10-18 MED FILL — HYDROCHLOROTHIAZIDE 25 MG T: 25 | 30 days supply | Qty: 30 | Fill #0

## 2019-10-21 ENCOUNTER — Ambulatory Visit (INDEPENDENT_AMBULATORY_CARE_PROVIDER_SITE_OTHER): Payer: Medicaid Other | Admitting: Clinical

## 2019-10-21 ENCOUNTER — Ambulatory Visit: Payer: Medicaid Other | Admitting: Podiatry

## 2019-10-21 ENCOUNTER — Other Ambulatory Visit: Payer: Self-pay

## 2019-10-21 DIAGNOSIS — F430 Acute stress reaction: Secondary | ICD-10-CM | POA: Diagnosis not present

## 2019-10-22 DIAGNOSIS — D2362 Other benign neoplasm of skin of left upper limb, including shoulder: Secondary | ICD-10-CM | POA: Diagnosis not present

## 2019-10-22 DIAGNOSIS — L810 Postinflammatory hyperpigmentation: Secondary | ICD-10-CM | POA: Diagnosis not present

## 2019-10-22 DIAGNOSIS — L700 Acne vulgaris: Secondary | ICD-10-CM | POA: Diagnosis not present

## 2019-10-22 DIAGNOSIS — D171 Benign lipomatous neoplasm of skin and subcutaneous tissue of trunk: Secondary | ICD-10-CM | POA: Diagnosis not present

## 2019-10-22 MED FILL — ACETAMINOPHEN/COD #3 TABLET: 300-30 | 7 days supply | Qty: 21 | Fill #1

## 2019-10-22 MED FILL — IBUPROFEN 800 MG TABLET: 800 | 20 days supply | Qty: 60 | Fill #1

## 2019-10-28 NOTE — BH Specialist Note (Signed)
Integrated Behavioral Health via Telemedicine Video (Caregility) Visit  10/28/2019 Kendra Griffith [id number]  Number of [place] visits: 2 Session Start time: 10:18  Session End time: 10:50 Total time: 32  Referring Provider: Arlina Robes, MD Type of Visit: Video Patient/Family location: [facility] Provider location: [facility] [place] [facility]  All persons participating in visit: Patient Kendra Griffith and [place]    Confirmed patient's address: Yes  Confirmed patient's phone number: Yes  Any changes to demographics: No   Confirmed patient's insurance: Yes  Any changes to patient's insurance: No   Discussed confidentiality: at previous visit  I connected with [name]  by a video enabled telemedicine application ([place]) and verified that I am speaking with the correct person using two identifiers.     I discussed the limitations of evaluation and management by telemedicine and the availability of in person appointments.  I discussed that the purpose of this visit is to provide behavioral health care while limiting exposure to the novel coronavirus.   Discussed there is a possibility of technology failure and discussed alternative modes of communication if that failure occurs.  I discussed that engaging in this virtual visit, they consent to the provision of behavioral healthcare and the services will be billed under their insurance.  Patient and/or legal guardian expressed understanding and consented to virtual visit: Yes   PRESENTING CONCERNS: Patient and/or family reports the following symptoms/concerns: Pt states her primary concern today remains finding housing; she is feeling more in control as she has talked to her landlord, is over halfway done with packing her belongings, and began setting up appointment with [facility] therapist, and there has been no gun violence at her apartments in the past two  weeks.  Duration of problem: Increase in past month; Severity of problem: moderately severe  STRENGTHS (Protective Factors/Coping Skills): Supportive adult children; adhering to treatment; problem-solving skills  GOALS ADDRESSED: Patient will: 1.  Reduce symptoms of: anxiety, depression and stress  2.  Demonstrate ability to: Increase healthy adjustment to current life circumstances and Increase motivation to adhere to plan of care  INTERVENTIONS: Interventions utilized:  Solution-Focused Strategies Standardized Assessments completed: Not Needed  ASSESSMENT: Patient currently experiencing Acute stress reaction  Patient may benefit from continued psychoeducation and brief therapeutic interventions regarding coping with symptoms of anxiety and depression .  PLAN: 1. Follow up with behavioral health clinician on : As needed 2. Behavioral recommendations:  -Continue with plan to call [facility] today about finalizing setting up next appointment for ongoing therapy -Continue allowing family to help with search for appropriate, safe housing -Congratulate yourself for having accomplished much the past two weeks (discussion with landlord and packing up over half of belongings) -Continue to consider using sleep app, as needed  -Consider doing daily gentle stretches on days feet are in too much pain to take walks(focus on what you can do rather than on what you cannot do right now) -Call [place] the first week of August to find out next step in GED process -Continue to consider replacing at least one cigarette/day with an enjoyable activity 3. Referral(s): [place] (In Clinic)  I discussed the assessment and treatment plan with the patient and/or parent/guardian. They were provided an opportunity to ask questions and all were answered. They agreed with the plan and demonstrated an understanding of the instructions.   They were advised to call back or seek an in-person  evaluation if the  symptoms worsen or if the condition fails to improve as anticipated.  Caroleen Hamman Demaurion Dicioccio

## 2019-11-01 ENCOUNTER — Encounter: Payer: Self-pay | Admitting: Nurse Practitioner

## 2019-11-04 ENCOUNTER — Ambulatory Visit (INDEPENDENT_AMBULATORY_CARE_PROVIDER_SITE_OTHER): Payer: Medicaid Other | Admitting: Clinical

## 2019-11-04 DIAGNOSIS — F430 Acute stress reaction: Secondary | ICD-10-CM | POA: Diagnosis not present

## 2019-11-07 ENCOUNTER — Other Ambulatory Visit: Payer: Self-pay | Admitting: Nurse Practitioner

## 2019-11-07 DIAGNOSIS — E118 Type 2 diabetes mellitus with unspecified complications: Secondary | ICD-10-CM

## 2019-11-07 DIAGNOSIS — G894 Chronic pain syndrome: Secondary | ICD-10-CM

## 2019-11-07 DIAGNOSIS — F419 Anxiety disorder, unspecified: Secondary | ICD-10-CM

## 2019-11-07 DIAGNOSIS — I10 Essential (primary) hypertension: Secondary | ICD-10-CM

## 2019-11-07 MED ORDER — HYDROXYZINE HCL 25 MG PO TABS: 25.0000 mg | ORAL_TABLET | Freq: Three times a day (TID) | ORAL | 0 refills | Status: DC | PRN

## 2019-11-07 MED ORDER — ATORVASTATIN CALCIUM 20 MG PO TABS: 20.0000 mg | ORAL_TABLET | Freq: Every day | ORAL | 0 refills | Status: DC

## 2019-11-07 MED ORDER — AMITRIPTYLINE HCL 50 MG PO TABS: 50.0000 mg | ORAL_TABLET | Freq: Every day | ORAL | 0 refills | Status: DC

## 2019-11-07 MED ORDER — IBUPROFEN 800 MG PO TABS: 800.0000 mg | ORAL_TABLET | Freq: Three times a day (TID) | ORAL | 0 refills | Status: DC | PRN

## 2019-11-07 MED ORDER — HYDROCHLOROTHIAZIDE 25 MG PO TABS: 25.0000 mg | ORAL_TABLET | Freq: Every day | ORAL | 0 refills | Status: DC

## 2019-11-07 NOTE — Telephone Encounter (Signed)
Requested medication (s) are due for refill today: yes  Last refill:  05/30/2019  Future visit scheduled: yes  Notes to clinic:  Patient has appointment schedule for 12/18/2019 Review for refill until that time    Requested Prescriptions  Pending Prescriptions Disp Refills   acetaminophen-codeine (TYLENOL #3) 300-30 MG tablet 60 tablet 0    Sig: Take 1 tablet by mouth every 8 (eight) hours as needed for moderate pain or severe pain.      Not Delegated - Analgesics:  Opioid Agonist Combinations Failed - 11/07/2019 10:58 AM      Failed - This refill cannot be delegated      Failed - Urine Drug Screen completed in last 360 days.      Failed - Valid encounter within last 6 months    Recent Outpatient Visits           8 months ago Anxiety and depression   [place], Vernia Buff, NP   9 months ago Anxiety and depression   [place], [place] W, NP   1 year ago Controlled type 2 diabetes mellitus with complication, without long-term current use of insulin [facility])   [place], Vernia Buff, NP   1 year ago Diabetes mellitus type 2, uncontrolled, with complications [facility])   [place], Zelda W, NP   1 year ago Well woman exam without gynecological exam   [place], Vernia Buff, NP       Future Appointments             In 1 month [name], NP [place]              amitriptyline (ELAVIL) 50 MG tablet 30 tablet 3    Sig: Take 1 tablet (50 mg total) by mouth at bedtime.      Psychiatry:  Antidepressants - Heterocyclics (TCAs) Failed - 11/07/2019 10:58 AM      Failed - Valid encounter within last 6 months    Recent Outpatient Visits           8 months ago Anxiety and depression   [place], Vernia Buff, NP   9 months ago  Anxiety and depression   [place], [place] W, NP   1 year ago Controlled type 2 diabetes mellitus with complication, without long-term current use of insulin [facility])   [place], Vernia Buff, NP   1 year ago Diabetes mellitus type 2, uncontrolled, with complications [facility])   [place], Zelda W, NP   1 year ago Well woman exam without gynecological exam   [place], Zelda W, NP       Future Appointments             In 1 month [name], NP [place] - Completed PHQ-2 or PHQ-9 in the last 360 days.        atorvastatin (LIPITOR) 20 MG tablet 90 tablet 3    Sig: Take 1 tablet (20 mg total) by mouth daily.      Cardiovascular:  Antilipid - Statins Failed - 11/07/2019 10:58 AM  Failed - LDL in normal range and within 360 days    LDL Calculated  Date Value Ref Range Status  11/27/2018 105 (H) 0 - 99 mg/dL Final          Passed - Total Cholesterol in normal range and within 360 days    Cholesterol, Total  Date Value Ref Range Status  11/27/2018 187 100 - 199 mg/dL Final          Passed - HDL in normal range and within 360 days    HDL  Date Value Ref Range Status  11/27/2018 66 >39 mg/dL Final          Passed - Triglycerides in normal range and within 360 days    Triglycerides  Date Value Ref Range Status  11/27/2018 82 0 - 149 mg/dL Final          Passed - Patient is not pregnant      Passed - Valid encounter within last 12 months    Recent Outpatient Visits           8 months ago Anxiety and depression   [place], Vernia Buff, NP   9 months ago Anxiety and depression   [place], [place] W, NP   1 year ago Controlled type 2 diabetes mellitus with complication, without  long-term current use of insulin [facility])   [place], [place] W, NP   1 year ago Diabetes mellitus type 2, uncontrolled, with complications [facility])   [place], Vernia Buff, NP   1 year ago Well woman exam without gynecological exam   [place], Vernia Buff, NP       Future Appointments             In 1 month [name], NP [place]              hydrochlorothiazide (HYDRODIURIL) 25 MG tablet 30 tablet 0    Sig: Take 1 tablet (25 mg total) by mouth daily. Patient needs OV for additional refills      Cardiovascular: Diuretics - Thiazide Failed - 11/07/2019 10:58 AM      Failed - Valid encounter within last 6 months    Recent Outpatient Visits           8 months ago Anxiety and depression   [place], Vernia Buff, NP   9 months ago Anxiety and depression   [place], [place] W, NP   1 year ago Controlled type 2 diabetes mellitus with complication, without long-term current use of insulin [facility])   [place], Vernia Buff, NP   1 year ago Diabetes mellitus type 2, uncontrolled, with complications [facility])   [place], Zelda W, NP   1 year ago Well woman exam without gynecological exam   [place], Zelda W, NP       Future Appointments             In 1 month [name], NP [place] in normal range and within 360 days    Calcium  Date Value Ref Range Status  07/15/2019 9.6 8.9 - 10.3 mg/dL Final   Calcium, Ion  Date Value Ref Range Status  10/27/2010 1.02 (L) 1.12 - 1.32 mmol/L Final          Passed - Cr in normal range and within 360 days    Creat  Date Value Ref Range Status   01/21/2016 0.83 0.50 - 1.05 mg/dL Final    Comment:      For patients > or = 53 years of age: The upper reference limit for Creatinine is approximately 13% higher for people identified as African-American.      Creatinine, Ser  Date Value Ref Range Status  07/15/2019 0.83 0.44 - 1.00 mg/dL Final   Creatinine,U  Date Value Ref Range Status  07/30/2009 73.1 mg/dL Final    Comment:    See lab report for associated comment(s)   Creatinine, Urine  Date Value Ref Range Status  01/21/2016 166 20 - 320 mg/dL Final          Passed - K in normal range and within 360 days    Potassium  Date Value Ref Range Status  07/15/2019 4.1 3.5 - 5.1 mmol/L Final          Passed - Na in normal range and within 360 days    Sodium  Date Value Ref Range Status  07/15/2019 141 135 - 145 mmol/L Final  11/27/2018 141 134 - 144 mmol/L Final          Passed - Last BP in normal range    BP Readings from Last 1 Encounters:  09/04/19 117/80            hydrOXYzine (ATARAX/VISTARIL) 25 MG tablet 60 tablet 1    Sig: Take 1 tablet (25 mg total) by mouth 3 (three) times daily as needed.      Ear, Nose, and Throat:  Antihistamines Passed - 11/07/2019 10:58 AM      Passed - Valid encounter within last 12 months    Recent Outpatient Visits           8 months ago Anxiety and depression   [place], Vernia Buff, NP   9 months ago Anxiety and depression   [place], [place] W, NP   1 year ago Controlled type 2 diabetes mellitus with complication, without long-term current use of insulin [facility])   [place], Vernia Buff, NP   1 year ago Diabetes mellitus type 2, uncontrolled, with complications [facility])   [place], Zelda W, NP   1 year ago Well woman exam without gynecological exam   [place], Vernia Buff, NP        Future Appointments             In 1 month [name], NP [place]              ibuprofen (ADVIL) 800 MG tablet 60 tablet 2    Sig: Take 1 tablet (800 mg total) by mouth every 8 (eight) hours as needed.      Analgesics:  NSAIDS Passed - 11/07/2019 10:58 AM      Passed - Cr in normal range and within 360 days    Creat  Date Value Ref Range Status  01/21/2016 0.83 0.50 - 1.05 mg/dL Final    Comment:  For patients > or = 54 years of age: The upper reference limit for Creatinine is approximately 13% higher for people identified as African-American.      Creatinine, Ser  Date Value Ref Range Status  07/15/2019 0.83 0.44 - 1.00 mg/dL Final   Creatinine,U  Date Value Ref Range Status  07/30/2009 73.1 mg/dL Final    Comment:    See lab report for associated comment(s)   Creatinine, Urine  Date Value Ref Range Status  01/21/2016 166 20 - 320 mg/dL Final          Passed - HGB in normal range and within 360 days    Hemoglobin  Date Value Ref Range Status  07/15/2019 12.2 12.0 - 15.0 g/dL Final  11/27/2018 12.7 11.1 - 15.9 g/dL Final          Passed - Patient is not pregnant      Passed - Valid encounter within last 12 months    Recent Outpatient Visits           8 months ago Anxiety and depression   [place], Vernia Buff, NP   9 months ago Anxiety and depression   [place], [place] W, NP   1 year ago Controlled type 2 diabetes mellitus with complication, without long-term current use of insulin [facility])   [place], Vernia Buff, NP   1 year ago Diabetes mellitus type 2, uncontrolled, with complications [facility])   [place], Zelda W, NP   1 year ago Well woman exam without gynecological exam   [place], Zelda W, NP       Future  Appointments             In 1 month [name], NP [place]

## 2019-11-07 NOTE — Telephone Encounter (Signed)
Patient requesting acetaminophen-codeine (TYLENOL #3) 300-30 MG tablet, hydrochlorothiazide (HYDRODIURIL) 25 MG tablet , hydrOXYzine (ATARAX/VISTARIL) 25 MG tablet , amitriptyline (ELAVIL) 50 MG tablet, ibuprofen (ADVIL) 800 MG tablet , atorvastatin (LIPITOR) 20 MG tablet, patient requested medication 1 week ago from pharmacy, informed patient please allow 48 to 72 hour turn around time.       [place] ([place]), [place] - [address] Phone:  [phone]  Fax:  [phone]

## 2019-11-13 ENCOUNTER — Ambulatory Visit: Payer: Medicaid Other | Admitting: Podiatry

## 2019-11-13 ENCOUNTER — Other Ambulatory Visit: Payer: Self-pay

## 2019-11-13 NOTE — Telephone Encounter (Signed)
[place] APPROVED FOR TYLENOL #3 THRU 05/11/20

## 2019-11-18 ENCOUNTER — Ambulatory Visit: Payer: Self-pay

## 2019-11-18 NOTE — Telephone Encounter (Signed)
Pt. Reports she has had "issues with rectal odor, pain.I have an appointment with my PCP next month." Reviewed home remedies for constipation. Verbalizes understanding. Will call back as needed.  Reason for Disposition . Mild rectal pain  Answer Assessment - Initial Assessment Questions 1. SYMPTOM:  "What's the main symptom you're concerned about?" (e.g., pain, itching, swelling, rash)     Foul order 2. ONSET: "When did the odor start?"      A while back 3. RECTAL PAIN: "Do you have any pain around your rectum?" "How bad is the pain?"  (Scale 1-10; or mild, moderate, severe)  - MILD (1-3): doesn't interfere with normal activities   - MODERATE (4-7): interferes with normal activities or awakens from sleep, limping   - SEVERE (8-10): excruciating pain, unable to have a bowel movement       10 4. RECTAL ITCHING: "Do you have any itching in this area?" "How bad is the itching?"  (Scale 1-10; or mild, moderate, severe)  - MILD - doesn't interfere with normal activities   - MODERATE-SEVERE: interferes with normal activities or awakens from sleep     No 5. CONSTIPATION: "Do you have constipation?" If Yes, ask: "How bad is it?"     Yes 6. CAUSE: "What do you think is causing the anus symptoms?"     Unsure 7. OTHER SYMPTOMS: "Do you have any other symptoms?"  (e.g., rectal bleeding, abdominal pain, vomiting, fever)     None 8. PREGNANCY: "Is there any chance you are pregnant?" "When was your last menstrual period?"     No  Protocols used: RECTAL [facility]

## 2019-11-18 NOTE — Telephone Encounter (Signed)
Requested medication (s) are due for refill today: no  Requested medication (s) are on the active medication list: yes  Last refill:  05/30/2019  Future visit scheduled: yes  Notes to clinic:  this refill cannot be delegated    Requested Prescriptions  Pending Prescriptions Disp Refills   acetaminophen-codeine (TYLENOL #3) 300-30 MG tablet 60 tablet 0    Sig: Take 1 tablet by mouth every 8 (eight) hours as needed for moderate pain or severe pain.      Not Delegated - Analgesics:  Opioid Agonist Combinations Failed - 11/18/2019  2:30 PM      Failed - This refill cannot be delegated      Failed - Urine Drug Screen completed in last 360 days.      Failed - Valid encounter within last 6 months    Recent Outpatient Visits           9 months ago Anxiety and depression   [place], Vernia Buff, NP   10 months ago Anxiety and depression   [place], [place] W, NP   1 year ago Controlled type 2 diabetes mellitus with complication, without long-term current use of insulin [facility])   [place], Vernia Buff, NP   1 year ago Diabetes mellitus type 2, uncontrolled, with complications [facility])   [place], Zelda W, NP   1 year ago Well woman exam without gynecological exam   [place], Zelda W, NP       Future Appointments             In 1 month [name], NP [place]

## 2019-11-18 NOTE — Addendum Note (Signed)
Addended by: Jefferson Fuel on: 11/18/2019 02:30 PM   Modules accepted: Orders

## 2019-11-18 NOTE — Telephone Encounter (Signed)
Pt would like the  acetaminophen-codeine (TYLENOL #3) 300-30 MG tablet  Sent to   [place] ([place] - [address] Phone:  [phone]  Fax:  [phone]

## 2019-12-06 ENCOUNTER — Ambulatory Visit: Payer: Medicaid Other | Admitting: Podiatry

## 2019-12-06 ENCOUNTER — Encounter: Payer: Self-pay | Admitting: Podiatry

## 2019-12-06 ENCOUNTER — Other Ambulatory Visit: Payer: Self-pay

## 2019-12-06 DIAGNOSIS — M792 Neuralgia and neuritis, unspecified: Secondary | ICD-10-CM

## 2019-12-06 DIAGNOSIS — M79674 Pain in right toe(s): Secondary | ICD-10-CM

## 2019-12-06 DIAGNOSIS — B351 Tinea unguium: Secondary | ICD-10-CM | POA: Diagnosis not present

## 2019-12-06 DIAGNOSIS — E1142 Type 2 diabetes mellitus with diabetic polyneuropathy: Secondary | ICD-10-CM

## 2019-12-06 DIAGNOSIS — Q828 Other specified congenital malformations of skin: Secondary | ICD-10-CM

## 2019-12-06 DIAGNOSIS — M79675 Pain in left toe(s): Secondary | ICD-10-CM | POA: Diagnosis not present

## 2019-12-08 NOTE — Progress Notes (Signed)
Subjective: Kendra Griffith presents today at risk foot care with history of peripheral neuropathy and painful porokeratotic lesion(s) plantarly b/l and painful mycotic toenails b/l that limit ambulation. Aggravating factors include weightbearing with and without shoe gear. Pain for both is relieved with periodic professional debridement.   She states she is looking for a new place to live.   Patient states she used the compounded neuropathy cream from [place]. She states it worked for a few days, then stopped. She is apprehensive about pain medicine because her sister is addicted to opioids.   Kendra Pounds, NP is patient's PCP. Last visit was: 02/15/2019.  Past Medical History:  Diagnosis Date  . ABSCESS, TOOTH 08/20/2009   Qualifier: Diagnosis of  By: Jorene Minors, Scott    . Anemia   . Anxiety   . Bipolar disorder ([place])   . Blood transfusion without reported diagnosis    childbirth  . Chronic pain    feet and back  . Clotting disorder ([place])    childbirth  . Dental caries    lesion soft palate  . Depression   . Diabetes mellitus without complication (HCC)    prediabetes  . GERD (gastroesophageal reflux disease)   . Hypertension   . Injury    right side from jumping off bunkbed  . Pre-diabetes   . Sickle cell trait ([place])   . Wears glasses      Current Outpatient Medications on File Prior to Visit  Medication Sig Dispense Refill  . acetaminophen-codeine (TYLENOL #3) 300-30 MG tablet Take 1 tablet by mouth every 8 (eight) hours as needed for moderate pain or severe pain. 60 tablet 0  . amitriptyline (ELAVIL) 50 MG tablet Take 1 tablet (50 mg total) by mouth at bedtime. 30 tablet 0  . atorvastatin (LIPITOR) 20 MG tablet Take 1 tablet (20 mg total) by mouth daily. 30 tablet 0  . Biotin 5 MG CAPS Take 5 mg by mouth 3 (three) times daily.    . cholecalciferol (VITAMIN D3) 25 MCG (1000 UT) tablet Take 1,000 Units by mouth daily.    . ferrous sulfate 324 MG TBEC Take  324 mg by mouth.    Marland Kitchen GLUTAMINE PO Take 500 mg by mouth.    . hydrochlorothiazide (HYDRODIURIL) 25 MG tablet Take 1 tablet (25 mg total) by mouth daily. Patient needs OV for additional refills 30 tablet 0  . hydrOXYzine (ATARAX/VISTARIL) 25 MG tablet Take 1 tablet (25 mg total) by mouth 3 (three) times daily as needed. 60 tablet 0  . ibuprofen (ADVIL) 800 MG tablet Take 1 tablet (800 mg total) by mouth every 8 (eight) hours as needed. 60 tablet 0  . Multiple Vitamin (MULTI-VITAMIN) tablet Take by mouth.    . NONFORMULARY OR COMPOUNDED ITEM Peripheral Neuropathy Cream: Bupivacaine 1%, Doxepin 3%, Gabapentin 6%, Pentoxifylline 3%, Topiramate 1% Order faxed to [place] 1 each 3  . Omega-3 Fatty Acids (OMEGA 3 500) 500 MG CAPS Take 500 mg by mouth daily.    . vitamin B-12 (CYANOCOBALAMIN) 500 MCG tablet Take 500 mcg by mouth daily.    . vitamin C (ASCORBIC ACID) 500 MG tablet Take 500 mg by mouth daily.     No current facility-administered medications on file prior to visit.     Allergies  Allergen Reactions  . Amoxicillin     Pain Did it involve swelling of the face/tongue/throat, SOB, or low BP? No Did it involve sudden or severe rash/hives, skin peeling, or any reaction  on the inside of your mouth or nose? No Did you need to seek medical attention at a hospital or doctor's office? Yes When did it last happen?5 years ago If all above answers are "NO", may proceed with cephalosporin use.   [name] [Propoxyphene N-Acetaminophen] Nausea And Vomiting    Makes her jittery  . Hydrocodone-Acetaminophen Nausea And Vomiting    Upset stomach  . Percocet [Oxycodone-Acetaminophen]     Makes her jittery  . Valium [Diazepam] Other (See Comments)    Makes pt. Feel "out of wack"    Objective: Kendra Griffith is a pleasant 54 y.o. female WD, WN in NAD. AAO x 3.  There were no vitals filed for this visit. Vascular Examination: Neurovascular status unchanged b/l lower  extremities. Capillary fill time to digits <3 seconds b/l lower extremities. Palpable pedal pulses b/l LE. Pedal hair present. Lower extremity skin temperature gradient within normal limits.  Dermatological Examination: Pedal skin with normal turgor, texture and tone bilaterally. No open wounds bilaterally. No interdigital macerations bilaterally. Toenails 1-5 b/l elongated, discolored, dystrophic, thickened, crumbly with subungual debris and tenderness to dorsal palpation. Porokeratotic lesion(s) submet head 1 right foot, submet head 2 left foot, submet head 5 left foot and submet head 5 right foot. No erythema, no edema, no drainage, no flocculence.  Musculoskeletal: Normal muscle strength 5/5 to all lower extremity muscle groups bilaterally. No pain crepitus or joint limitation noted with ROM b/l. No gross bony deformities bilaterally.  Neurological Examination: Protective sensation decreased with 10 gram monofilament b/l.   Assessment: 1. Pain due to onychomycosis of toenails of both feet   2. Porokeratosis   3. Neuropathic pain   4. Diabetic peripheral neuropathy associated with type 2 diabetes mellitus ([place])    Plan: -Examined patient. -Patient refused porokeratosis debridement. ABN signed and on chart.  -Continue diabetic foot care principles. -Toenails 1-5 b/l were debrided in length and girth with sterile nail nippers and dremel without iatrogenic bleeding.  -Patient to report any pedal injuries to medical professional immediately. -Patient to continue soft, supportive shoe gear daily. -Patient/POA to call should there be question/concern in the interim.  Return in about 3 months (around 03/07/2020) for diabetic nail trim.  Marzetta Board, DPM

## 2019-12-18 ENCOUNTER — Telehealth (HOSPITAL_BASED_OUTPATIENT_CLINIC_OR_DEPARTMENT_OTHER): Payer: Medicaid Other | Admitting: Nurse Practitioner

## 2019-12-18 DIAGNOSIS — E785 Hyperlipidemia, unspecified: Secondary | ICD-10-CM

## 2019-12-18 DIAGNOSIS — I10 Essential (primary) hypertension: Secondary | ICD-10-CM

## 2019-12-18 DIAGNOSIS — G894 Chronic pain syndrome: Secondary | ICD-10-CM

## 2019-12-18 DIAGNOSIS — F419 Anxiety disorder, unspecified: Secondary | ICD-10-CM | POA: Diagnosis not present

## 2019-12-18 DIAGNOSIS — F329 Major depressive disorder, single episode, unspecified: Secondary | ICD-10-CM

## 2019-12-18 DIAGNOSIS — E118 Type 2 diabetes mellitus with unspecified complications: Secondary | ICD-10-CM

## 2019-12-18 DIAGNOSIS — K219 Gastro-esophageal reflux disease without esophagitis: Secondary | ICD-10-CM

## 2019-12-18 DIAGNOSIS — D649 Anemia, unspecified: Secondary | ICD-10-CM | POA: Diagnosis not present

## 2019-12-18 NOTE — Progress Notes (Signed)
Virtual Visit via Telephone Note Due to national recommendations of social distancing due to [place] 19, telehealth visit is felt to be most appropriate for this patient at this time.  I discussed the limitations, risks, security and privacy concerns of performing an evaluation and management service by telephone and the availability of in person appointments. I also discussed with the patient that there may be a patient responsible charge related to this service. The patient expressed understanding and agreed to proceed.    I connected with [name] on 12/18/19  at   3:50 PM EDT  EDT by telephone and verified that I am speaking with the correct person using two identifiers.   Consent I discussed the limitations, risks, security and privacy concerns of performing an evaluation and management service by telephone and the availability of in person appointments. I also discussed with the patient that there may be a patient responsible charge related to this service. The patient expressed understanding and agreed to proceed.   Location of Patient: Private Residence   Location of Provider: [place] participating in Telemedicine visit: [name] FNP-BC [place]    History of Present Illness: Telemedicine visit for: Follow up  She has chronic constipation. Has tried miralax, laxatives and stool softeners in the past which were not effective. Also concerned she may have anal fissures. She does have internal hemorrhoids that sometimes drain a foul odor and uses a sitz bath to help relieve her pain. She feels her symptoms worsened after her colonoscopy 3 years ago. Associated symptoms include low back pain.   Essential Hypertension She just purchased a blood pressure device but has not used it yet.  She endorses lightheadedness and dizziness most of the day. Symptoms seem to go away when she drinks more fluids. Denies chest  pain, shortness of breath, palpitations, headaches or BLE edema. She continues to smoke.   BP Readings from Last 3 Encounters:  09/04/19 117/80  08/01/19 127/89  07/16/19 (P) 127/84    Dyslipidemia The 10-year ASCVD risk score Mikey Bussing DC Jr., et al., 2013) is: 11.8%   Values used to calculate the score:     Age: 54 years     Sex: Female     Is Non-Hispanic African American: Yes     Diabetic: Yes     Tobacco smoker: Yes     Systolic Blood Pressure: 892 mmHg     Is BP treated: Yes     HDL Cholesterol: 66 mg/dL     Total Cholesterol: 187 mg/dL  She does not feel she needs to take cholesterol medication. Would like to recheck lipids. States her cholesterol medication makes her dizzy, disoriented and sick to her stomach. Will obtain lipid profile. May need to try to take just a few times a week instead of daily.   DM TYPE 2 Well controlled without medications at this time. Highest A1c 6.5 Lab Results  Component Value Date   HGBA1C 6.3 (H) 11/27/2018   Past Medical History:  Diagnosis Date  . ABSCESS, TOOTH 08/20/2009   Qualifier: Diagnosis of  By: Jorene Minors, Scott    . Anemia   . Anxiety   . Bipolar disorder ([place])   . Blood transfusion without reported diagnosis    childbirth  . Chronic pain    feet and back  . Clotting disorder ([place])    childbirth  . Dental caries    lesion soft palate  .  Depression   . Diabetes mellitus without complication (HCC)    prediabetes  . GERD (gastroesophageal reflux disease)   . Hypertension   . Injury    right side from jumping off bunkbed  . Pre-diabetes   . Sickle cell trait ([place])   . Wears glasses     Past Surgical History:  Procedure Laterality Date  . BARTHOLIN CYST MARSUPIALIZATION N/A 07/16/2019   Procedure: EXCISION OF BARTHOLIN CYST;  Surgeon: Chancy Milroy, MD;  Location: [facility];  Service: Gynecology;  Laterality: N/A;  . CESAREAN SECTION     three  . COLONOSCOPY    . cyst removal 1997    . FACIAL  RECONSTRUCTION SURGERY    . FOOT SURGERY Bilateral   . LESION REMOVAL Left 10/15/2018   Procedure: Lesion Removal left soft palate;  Surgeon: Diona Browner, DDS;  Location: [facility];  Service: Oral Surgery;  Laterality: Left;  . TOOTH EXTRACTION Bilateral 10/15/2018   Procedure: DENTAL EXTRACTIONS OF TEETH NUMBER TWO, FOUR, FIVE, SIX, SEVEN, EIGHT, NINE, TEN, ELEVEN, TWELVE, THIRTEEN, FOURTEEN, SEVENTEEN, TWENTY-ONE, TWENTY-TWO, TWENTY-THREE, TWENTY-FOUR, TWENTY-FIVE, TWENTY-SIX, TWENTY-SEVEN, TWENTY-EIGHT WITH ALVEOLOPLASTY;  Surgeon: Diona Browner, DDS;  Location: [facility];  Service: Oral Surgery;  Laterality: Bilateral;  . TUBAL LIGATION      Family History  Problem Relation Age of Onset  . Heart disease Mother   . Asthma Sister   . Diabetes Sister   . COPD Sister   . Diabetes Sister   . Diabetes Sister   . Breast cancer Neg Hx   . Colon cancer Neg Hx     Social History   Socioeconomic History  . Marital status: Single    Spouse name: Not on file  . Number of children: 3  . Years of education: 8-9  . Highest education level: Not on file  Occupational History  . Occupation: applying for disability  Tobacco Use  . Smoking status: Current Every Day Smoker    Packs/day: 2.00    Years: 29.00    Pack years: 58.00    Types: Cigarettes  . Smokeless tobacco: Never Used  Vaping Use  . Vaping Use: Never used  Substance and Sexual Activity  . Alcohol use: No    Alcohol/week: 0.0 standard drinks    Comment: Stopped drinking 2013  . Drug use: No  . Sexual activity: Not Currently    Birth control/protection: None  Other Topics Concern  . Not on file  Social History Narrative   Patient lives alone in an apartment on the first floor.  3 children.  Applying for disability.  Education: 8th or 9th grade.   Currently not working   Trying to go back to school online for [employer]   Six Grandchildren   Social Determinants of Health   Financial Resource Strain:   . Difficulty of Paying Living  Expenses: Not on file  Food Insecurity: No Food Insecurity  . Worried About Charity fundraiser in the Last Year: Never true  . Ran Out of Food in the Last Year: Never true  Transportation Needs: Unmet Transportation Needs  . Lack of Transportation (Medical): No  . Lack of Transportation (Non-Medical): Yes  Physical Activity:   . Days of Exercise per Week: Not on file  . Minutes of Exercise per Session: Not on file  Stress:   . Feeling of Stress : Not on file  Social Connections:   . Frequency of Communication with Friends and Family: Not on file  .  Frequency of Social Gatherings with Friends and Family: Not on file  . Attends Religious Services: Not on file  . Active Member of Clubs or Organizations: Not on file  . Attends Archivist Meetings: Not on file  . Marital Status: Not on file     Observations/Objective: Awake, alert and oriented x 3   Review of Systems  Constitutional: Negative for fever, malaise/fatigue and weight loss.  HENT: Negative.  Negative for nosebleeds.   Eyes: Negative.  Negative for blurred vision, double vision and photophobia.  Respiratory: Negative.  Negative for cough and shortness of breath.   Cardiovascular: Negative.  Negative for chest pain, palpitations and leg swelling.  Gastrointestinal: Positive for heartburn. Negative for nausea and vomiting.  Musculoskeletal: Positive for myalgias.  Neurological: Negative.  Negative for dizziness, focal weakness, seizures and headaches.  Psychiatric/Behavioral: Positive for depression. Negative for suicidal ideas. The patient is nervous/anxious.     Assessment and Plan: Diagnoses and all orders for this visit:  Essential hypertension -     CMP14+EGFR -     hydrochlorothiazide (HYDRODIURIL) 25 MG tablet; Take 1 tablet (25 mg total) by mouth daily. Continue all antihypertensives as prescribed.  Remember to bring in your blood pressure log with you for your follow up appointment.   DASH/Mediterranean Diets are healthier choices for HTN.   Controlled type 2 diabetes mellitus with complication, without long-term current use of insulin (HCC) -     Hemoglobin A1c -     Microalbumin / creatinine urine ratio Continue blood sugar control as discussed in office today, low carbohydrate diet, and regular physical exercise as tolerated, 150 minutes per week (30 min each day, 5 days per week, or 50 min 3 days per week). Keep blood sugar logs with fasting goal of 90-130 mg/dl, post prandial (after you eat) less than 180.  For Hypoglycemia: BS <60 and Hyperglycemia BS >400; contact the clinic ASAP. Annual eye exams and foot exams are recommended.   Anemia, unspecified type -     CBC  Dyslipidemia, goal LDL below 70 -     Lipid panel INSTRUCTIONS: Work on a low fat, heart healthy diet and participate in regular aerobic exercise program by working out at least 150 minutes per week; 5 days a week-30 minutes per day. Avoid red meat/beef/steak,  fried foods. junk foods, sodas, sugary drinks, unhealthy snacking, alcohol and smoking.  Drink at least 80 oz of water per day and monitor your carbohydrate intake daily.    Chronic pain syndrome -     acetaminophen-codeine (TYLENOL #3) 300-30 MG tablet; Take 1 tablet by mouth every 8 (eight) hours as needed for moderate pain or severe pain. -     amitriptyline (ELAVIL) 50 MG tablet; Take 1 tablet (50 mg total) by mouth at bedtime.  Anxiety and depression -     amitriptyline (ELAVIL) 50 MG tablet; Take 1 tablet (50 mg total) by mouth at bedtime.  Gastroesophageal reflux disease without esophagitis -     famotidine (PEPCID) 20 MG tablet; Take 1 tablet (20 mg total) by mouth 2 (two) times daily. INSTRUCTIONS: Avoid GERD Triggers: acidic, spicy or fried foods, caffeine, coffee, sodas,  alcohol and chocolate.      Follow Up Instructions Return in about 6 weeks (around 01/29/2020) for HTN.     I discussed the assessment and treatment plan  with the patient. The patient was provided an opportunity to ask questions and all were answered. The patient agreed with the plan and demonstrated  an understanding of the instructions.   The patient was advised to call back or seek an in-person evaluation if the symptoms worsen or if the condition fails to improve as anticipated.  I provided 20 minutes of non-face-to-face time during this encounter including median intraservice time, reviewing previous notes, labs, imaging, medications and explaining diagnosis and management.  [name], FNP-BC

## 2019-12-19 ENCOUNTER — Encounter: Payer: Self-pay | Admitting: Nurse Practitioner

## 2019-12-19 MED ORDER — ACETAMINOPHEN-CODEINE #3 300-30 MG PO TABS: 1.0000 | ORAL_TABLET | Freq: Three times a day (TID) | ORAL | 0 refills | Status: DC | PRN

## 2019-12-19 MED ORDER — FAMOTIDINE 20 MG PO TABS: 20.0000 mg | ORAL_TABLET | Freq: Two times a day (BID) | ORAL | 1 refills | Status: DC

## 2019-12-19 MED ORDER — AMITRIPTYLINE HCL 50 MG PO TABS: 50.0000 mg | ORAL_TABLET | Freq: Every day | ORAL | 0 refills | Status: DC

## 2019-12-19 MED ORDER — HYDROCHLOROTHIAZIDE 25 MG PO TABS: 25.0000 mg | ORAL_TABLET | Freq: Every day | ORAL | 0 refills | Status: DC

## 2019-12-23 DIAGNOSIS — F603 Borderline personality disorder: Secondary | ICD-10-CM | POA: Diagnosis not present

## 2019-12-23 DIAGNOSIS — F315 Bipolar disorder, current episode depressed, severe, with psychotic features: Secondary | ICD-10-CM | POA: Diagnosis not present

## 2019-12-23 DIAGNOSIS — F4310 Post-traumatic stress disorder, unspecified: Secondary | ICD-10-CM | POA: Diagnosis not present

## 2019-12-25 ENCOUNTER — Other Ambulatory Visit: Payer: Medicaid Other

## 2019-12-26 ENCOUNTER — Telehealth: Payer: Self-pay | Admitting: Nurse Practitioner

## 2019-12-26 NOTE — Telephone Encounter (Signed)
Copied from [place] [id number]. Topic: General - Other >> Dec 26, 2019  3:28 PM Oneta Rack wrote: Osvaldo Human name: Wendelyn Breslow  Relation to pt: [facility] Call back number: [phone] fax [phone]  Reason for call:   Checking on the status of Urinary incontinence supplies. Order Request faxed over on 12/20/2019 to [phone] and will refax today, please note when received.

## 2019-12-30 NOTE — Telephone Encounter (Signed)
CMA received it, will let PCP review it. Once completed, will fax it.

## 2020-01-02 ENCOUNTER — Other Ambulatory Visit: Payer: Medicaid Other

## 2020-01-07 ENCOUNTER — Other Ambulatory Visit: Payer: Self-pay | Admitting: Nurse Practitioner

## 2020-01-07 ENCOUNTER — Ambulatory Visit: Payer: Medicaid Other | Attending: Nurse Practitioner

## 2020-01-07 ENCOUNTER — Other Ambulatory Visit: Payer: Self-pay

## 2020-01-07 ENCOUNTER — Ambulatory Visit (HOSPITAL_BASED_OUTPATIENT_CLINIC_OR_DEPARTMENT_OTHER): Payer: Medicaid Other | Admitting: Pharmacist

## 2020-01-07 DIAGNOSIS — I10 Essential (primary) hypertension: Secondary | ICD-10-CM

## 2020-01-07 DIAGNOSIS — D649 Anemia, unspecified: Secondary | ICD-10-CM

## 2020-01-07 DIAGNOSIS — E1142 Type 2 diabetes mellitus with diabetic polyneuropathy: Secondary | ICD-10-CM

## 2020-01-07 DIAGNOSIS — Z23 Encounter for immunization: Secondary | ICD-10-CM

## 2020-01-07 NOTE — Progress Notes (Signed)
Patient presents for vaccination against influenza per orders of Zelda. Consent given. Counseling provided. No contraindications exists. Vaccine administered without incident.  ° °[name] Van Ausdall, PharmD, CPP °Clinical Pharmacist °[facility] °[phone] ° °

## 2020-01-08 LAB — LIPID PANEL
Chol/HDL Ratio: 2.6 ratio (ref 0.0–4.4)
Cholesterol, Total: 164 mg/dL (ref 100–199)
HDL: 64 mg/dL (ref >39)
LDL Chol Calc (NIH): 84 mg/dL (ref 0–99)
Triglycerides: 86 mg/dL (ref 0–149)
VLDL Cholesterol Cal: 16 mg/dL (ref 5–40)

## 2020-01-08 LAB — CMP14+EGFR
ALT: 18 IU/L (ref 0–32)
AST: 19 IU/L (ref 0–40)
Albumin/Globulin Ratio: 1.9 (ref 1.2–2.2)
Albumin: 4.5 g/dL (ref 3.8–4.9)
Alkaline Phosphatase: 92 IU/L (ref 44–121)
BUN/Creatinine Ratio: 15 (ref 9–23)
BUN: 14 mg/dL (ref 6–24)
Bilirubin Total: 0.3 mg/dL (ref 0.0–1.2)
CO2: 26 mmol/L (ref 20–29)
Calcium: 9.8 mg/dL (ref 8.7–10.2)
Chloride: 103 mmol/L (ref 96–106)
Creatinine, Ser: 0.92 mg/dL (ref 0.57–1.00)
GFR calc Af Amer: 82 mL/min/{1.73_m2} (ref >59)
GFR calc non Af Amer: 71 mL/min/{1.73_m2} (ref >59)
Globulin, Total: 2.4 g/dL (ref 1.5–4.5)
Glucose: 85 mg/dL (ref 65–99)
Potassium: 4.2 mmol/L (ref 3.5–5.2)
Sodium: 141 mmol/L (ref 134–144)
Total Protein: 6.9 g/dL (ref 6.0–8.5)

## 2020-01-08 LAB — HEMOGLOBIN A1C
Est. average glucose Bld gHb Est-mCnc: 148 mg/dL
Hgb A1c MFr Bld: 6.8 % — ABNORMAL HIGH (ref 4.8–5.6)

## 2020-01-08 LAB — CBC
Hematocrit: 36.6 % (ref 34.0–46.6)
Hemoglobin: 12.4 g/dL (ref 11.1–15.9)
MCH: 28.4 pg (ref 26.6–33.0)
MCHC: 33.9 g/dL (ref 31.5–35.7)
MCV: 84 fL (ref 79–97)
Platelets: 163 10*3/uL (ref 150–450)
RBC: 4.36 x10E6/uL (ref 3.77–5.28)
RDW: 15.2 % (ref 11.7–15.4)
WBC: 9.5 10*3/uL (ref 3.4–10.8)

## 2020-01-08 LAB — MICROALBUMIN / CREATININE URINE RATIO
Creatinine, Urine: 116.4 mg/dL
Microalb/Creat Ratio: 7 mg/g creat (ref 0–29)
Microalbumin, Urine: 7.8 ug/mL

## 2020-02-04 DIAGNOSIS — L700 Acne vulgaris: Secondary | ICD-10-CM | POA: Diagnosis not present

## 2020-02-04 DIAGNOSIS — L810 Postinflammatory hyperpigmentation: Secondary | ICD-10-CM | POA: Diagnosis not present

## 2020-02-24 ENCOUNTER — Other Ambulatory Visit: Payer: Self-pay

## 2020-02-24 ENCOUNTER — Encounter: Payer: Self-pay | Admitting: Nurse Practitioner

## 2020-02-24 ENCOUNTER — Ambulatory Visit: Payer: Medicaid Other | Attending: Nurse Practitioner | Admitting: Nurse Practitioner

## 2020-02-24 VITALS — BP 130/89 | HR 70 | Temp 96.5°F | Ht 65.0 in | Wt 171.0 lb

## 2020-02-24 DIAGNOSIS — I10 Essential (primary) hypertension: Secondary | ICD-10-CM | POA: Insufficient documentation

## 2020-02-24 DIAGNOSIS — G894 Chronic pain syndrome: Secondary | ICD-10-CM | POA: Insufficient documentation

## 2020-02-24 DIAGNOSIS — E1142 Type 2 diabetes mellitus with diabetic polyneuropathy: Secondary | ICD-10-CM

## 2020-02-24 DIAGNOSIS — F319 Bipolar disorder, unspecified: Secondary | ICD-10-CM | POA: Insufficient documentation

## 2020-02-24 DIAGNOSIS — K5909 Other constipation: Secondary | ICD-10-CM | POA: Diagnosis not present

## 2020-02-24 DIAGNOSIS — F419 Anxiety disorder, unspecified: Secondary | ICD-10-CM | POA: Insufficient documentation

## 2020-02-24 DIAGNOSIS — Z79899 Other long term (current) drug therapy: Secondary | ICD-10-CM | POA: Diagnosis not present

## 2020-02-24 DIAGNOSIS — F17200 Nicotine dependence, unspecified, uncomplicated: Secondary | ICD-10-CM | POA: Diagnosis not present

## 2020-02-24 DIAGNOSIS — E785 Hyperlipidemia, unspecified: Secondary | ICD-10-CM | POA: Diagnosis not present

## 2020-02-24 DIAGNOSIS — F32A Depression, unspecified: Secondary | ICD-10-CM

## 2020-02-24 LAB — GLUCOSE, POCT (MANUAL RESULT ENTRY): POC Glucose: 113 mg/dl — AB (ref 70–99)

## 2020-02-24 MED ORDER — ACETAMINOPHEN-CODEINE #3 300-30 MG PO TABS: 1.0000 | ORAL_TABLET | Freq: Three times a day (TID) | ORAL | 0 refills | Status: DC | PRN

## 2020-02-24 MED ORDER — HYDROCHLOROTHIAZIDE 25 MG PO TABS: 25.0000 mg | ORAL_TABLET | Freq: Every day | ORAL | 0 refills | Status: DC

## 2020-02-24 MED ORDER — IBUPROFEN 800 MG PO TABS: 800.0000 mg | ORAL_TABLET | Freq: Three times a day (TID) | ORAL | 1 refills | Status: DC | PRN

## 2020-02-24 MED ORDER — SENNOSIDES-DOCUSATE SODIUM 8.6-50 MG PO TABS: 1.0000 | ORAL_TABLET | Freq: Two times a day (BID) | ORAL | 1 refills | Status: AC

## 2020-02-24 MED ORDER — HYDROXYZINE HCL 25 MG PO TABS: 25.0000 mg | ORAL_TABLET | Freq: Three times a day (TID) | ORAL | 1 refills | Status: DC | PRN

## 2020-02-24 MED ORDER — ALBUTEROL SULFATE HFA 108 (90 BASE) MCG/ACT IN AERS: 2.0000 | INHALATION_SPRAY | Freq: Four times a day (QID) | RESPIRATORY_TRACT | 2 refills | Status: DC | PRN

## 2020-02-24 MED ORDER — AMITRIPTYLINE HCL 75 MG PO TABS: 75.0000 mg | ORAL_TABLET | Freq: Every day | ORAL | 1 refills | Status: DC

## 2020-02-24 NOTE — Progress Notes (Signed)
Assessment & Plan:  Kendra Griffith was seen today for hypertension.  Diagnoses and all orders for this visit:  Type 2 diabetes mellitus with diabetic polyneuropathy, without long-term current use of insulin (HCC) -     Glucose (CBG) -     Basic metabolic panel Continue blood sugar control as discussed in office today, low carbohydrate diet, and regular physical exercise as tolerated, 150 minutes per week (30 min each day, 5 days per week, or 50 min 3 days per week). Keep blood sugar logs with fasting goal of 90-130 mg/dl, post prandial (after you eat) less than 180.  For Hypoglycemia: BS <60 and Hyperglycemia BS >400; contact the clinic ASAP. Annual eye exams and foot exams are recommended.    Essential hypertension -     hydrochlorothiazide (HYDRODIURIL) 25 MG tablet; Take 1 tablet (25 mg total) by mouth daily. -     Basic metabolic panel Continue all antihypertensives as prescribed.  Remember to bring in your blood pressure log with you for your follow up appointment.  DASH/Mediterranean Diets are healthier choices for HTN.    Dyslipidemia, goal LDL below 70 INSTRUCTIONS: Work on a low fat, heart healthy diet and participate in regular aerobic exercise program by working out at least 150 minutes per week; 5 days a week-30 minutes per day. Avoid red meat/beef/steak,  fried foods. junk foods, sodas, sugary drinks, unhealthy snacking, alcohol and smoking.  Drink at least 80 oz of water per day and monitor your carbohydrate intake daily.    Anxiety and depression Denies any thoughts of self harm -     hydrOXYzine (ATARAX/VISTARIL) 25 MG tablet; Take 1 tablet (25 mg total) by mouth 3 (three) times daily as needed. -     amitriptyline (ELAVIL) 75 MG tablet; Take 1 tablet (75 mg total) by mouth at bedtime.  Chronic pain syndrome CONTROLLED -     ibuprofen (ADVIL) 800 MG tablet; Take 1 tablet (800 mg total) by mouth every 8 (eight) hours as needed. -     amitriptyline (ELAVIL) 75 MG tablet;  Take 1 tablet (75 mg total) by mouth at bedtime. -     acetaminophen-codeine (TYLENOL #3) 300-30 MG tablet; Take 1 tablet by mouth every 8 (eight) hours as needed for moderate pain or severe pain.  Chronic constipation -     senna-docusate (SENOKOT-S) 8.6-50 MG tablet; Take 1-2 tablets by mouth 2 (two) times daily.  Tobacco dependence -     albuterol (VENTOLIN HFA) 108 (90 Base) MCG/ACT inhaler; Inhale 2 puffs into the lungs every 6 (six) hours as needed for wheezing or shortness of breath. Kendra Griffith was counseled on the dangers of tobacco use, and was advised to quit. Reviewed strategies to maximize success, including removing cigarettes and smoking materials from environment, stress management and support of family/friends as well as pharmacological alternatives including: Wellbutrin, Chantix, Nicotine patch, Nicotine gum or lozenges. Smoking cessation support: smoking cessation hotline: 1-800-QUIT-NOW.  Smoking cessation classes are also available through [facility] and [facility]. Call [phone] or visit our website at [email or web address]   A total of 2 minutes was spent on counseling for smoking cessation and Kendra Griffith is not ready to quit.    Patient has been counseled on age-appropriate routine health concerns for screening and prevention. These are reviewed and up-to-date. Referrals have been placed accordingly. Immunizations are up-to-date or declined.    Subjective:   Chief Complaint  Patient presents with  . Hypertension    Patient is here to follow up  on hypertension.    HPI Kendra Griffith 54 y.o. female presents to office today for follow up.  She has a PMH significant for HTN,  Anemia, Anxiety, Bipolar disorder, Chronic pain,  Depression, DM2, GERD.   DM TYPE 2 Currently diet controlled.   Lab Results  Component Value Date   HGBA1C 6.8 (H) 01/07/2020   Essential Hypertension  Blood pressure is well controlled. Taking HCTZ 25 mg daily. Denies chest pain,  shortness of breath, palpitations, lightheadedness, dizziness, headaches or BLE edema.  BP Readings from Last 3 Encounters:  02/24/20 130/89  09/04/19 117/80  08/01/19 127/89   Dyslipidemia She is only taking OTC omega 3. Stopped taking atorvastatin stating she breaks into a sweat and her stomach cramps every time she takes it.  Lab Results  Component Value Date   CHOL 164 01/07/2020   CHOL 187 11/27/2018   CHOL 163 10/05/2016   Lab Results  Component Value Date   HDL 64 01/07/2020   HDL 66 11/27/2018   HDL 49 10/05/2016   Lab Results  Component Value Date   LDLCALC 84 01/07/2020   LDLCALC 105 (H) 11/27/2018   LDLCALC 98 10/05/2016   Lab Results  Component Value Date   TRIG 86 01/07/2020   TRIG 82 11/27/2018   TRIG 79 10/05/2016   Lab Results  Component Value Date   CHOLHDL 2.6 01/07/2020   CHOLHDL 2.8 11/27/2018   CHOLHDL 3.3 10/05/2016   She believes she has anal fissures and endorses external hemorrhoids that flare and enlarge when she is constipated. She has chronic constipation. Had bartholin's cyst removed earlier this year. Normal colonoscopy 12-2016.  Endorses insomnia with difficulty staying asleep. She also notices mild jerking of her extremities a few times a week. Drinks caffeine and tea which I have recommended she cut back on or completely stop.   Review of Systems  Constitutional: Negative for fever, malaise/fatigue and weight loss.  HENT: Negative.  Negative for nosebleeds.   Eyes: Negative.  Negative for blurred vision, double vision and photophobia.  Respiratory: Negative.  Negative for cough and shortness of breath.   Cardiovascular: Negative.  Negative for chest pain, palpitations and leg swelling.  Gastrointestinal: Positive for constipation. Negative for heartburn, nausea and vomiting.       SEE HPI  Musculoskeletal: Positive for myalgias.       Chronic pain syndrome  Neurological: Negative.  Negative for dizziness, focal weakness, seizures and  headaches.  Psychiatric/Behavioral: Positive for depression. Negative for suicidal ideas. The patient is nervous/anxious and has insomnia.     Past Medical History:  Diagnosis Date  . ABSCESS, TOOTH 08/20/2009   Qualifier: Diagnosis of  By: Jorene Minors, Scott    . Anemia   . Anxiety   . Bipolar disorder ([place])   . Blood transfusion without reported diagnosis    childbirth  . Chronic pain    feet and back  . Clotting disorder ([place])    childbirth  . Dental caries    lesion soft palate  . Depression   . Diabetes mellitus without complication (HCC)    prediabetes  . GERD (gastroesophageal reflux disease)   . Hypertension   . Injury    right side from jumping off bunkbed  . Pre-diabetes   . Sickle cell trait ([place])   . Wears glasses     Past Surgical History:  Procedure Laterality Date  . BARTHOLIN CYST MARSUPIALIZATION N/A 07/16/2019   Procedure: EXCISION OF BARTHOLIN CYST;  Surgeon: Arlina Robes  L, MD;  Location: [facility];  Service: Gynecology;  Laterality: N/A;  . CESAREAN SECTION     three  . COLONOSCOPY    . cyst removal 1997    . FACIAL RECONSTRUCTION SURGERY    . FOOT SURGERY Bilateral   . LESION REMOVAL Left 10/15/2018   Procedure: Lesion Removal left soft palate;  Surgeon: Diona Browner, DDS;  Location: [facility];  Service: Oral Surgery;  Laterality: Left;  . TOOTH EXTRACTION Bilateral 10/15/2018   Procedure: DENTAL EXTRACTIONS OF TEETH NUMBER TWO, FOUR, FIVE, SIX, SEVEN, EIGHT, NINE, TEN, ELEVEN, TWELVE, THIRTEEN, FOURTEEN, SEVENTEEN, TWENTY-ONE, TWENTY-TWO, TWENTY-THREE, TWENTY-FOUR, TWENTY-FIVE, TWENTY-SIX, TWENTY-SEVEN, TWENTY-EIGHT WITH ALVEOLOPLASTY;  Surgeon: Diona Browner, DDS;  Location: [facility];  Service: Oral Surgery;  Laterality: Bilateral;  . TUBAL LIGATION      Family History  Problem Relation Age of Onset  . Heart disease Mother   . Asthma Sister   . Diabetes Sister   . COPD Sister   . Diabetes Sister   . Diabetes Sister   . Breast cancer  Neg Hx   . Colon cancer Neg Hx     Social History Reviewed with no changes to be made today.   Outpatient Medications Prior to Visit  Medication Sig Dispense Refill  . Biotin 5 MG CAPS Take 5 mg by mouth 3 (three) times daily.    . cholecalciferol (VITAMIN D3) 25 MCG (1000 UT) tablet Take 1,000 Units by mouth daily.    . famotidine (PEPCID) 20 MG tablet Take 1 tablet (20 mg total) by mouth 2 (two) times daily. 180 tablet 1  . ferrous sulfate 324 MG TBEC Take 324 mg by mouth.    Marland Kitchen GLUTAMINE PO Take 500 mg by mouth.    . Multiple Vitamin (MULTI-VITAMIN) tablet Take by mouth.    . NONFORMULARY OR COMPOUNDED ITEM Peripheral Neuropathy Cream: Bupivacaine 1%, Doxepin 3%, Gabapentin 6%, Pentoxifylline 3%, Topiramate 1% Order faxed to [facility] 1 each 3  . Omega-3 Fatty Acids (OMEGA 3 500) 500 MG CAPS Take 500 mg by mouth daily.    . vitamin B-12 (CYANOCOBALAMIN) 500 MCG tablet Take 500 mcg by mouth daily.    . vitamin C (ASCORBIC ACID) 500 MG tablet Take 500 mg by mouth daily.    Marland Kitchen acetaminophen-codeine (TYLENOL #3) 300-30 MG tablet Take 1 tablet by mouth every 8 (eight) hours as needed for moderate pain or severe pain. 60 tablet 0  . hydrochlorothiazide (HYDRODIURIL) 25 MG tablet Take 1 tablet (25 mg total) by mouth daily. 90 tablet 0  . hydrOXYzine (ATARAX/VISTARIL) 25 MG tablet Take 1 tablet (25 mg total) by mouth 3 (three) times daily as needed. 60 tablet 0  . ibuprofen (ADVIL) 800 MG tablet Take 1 tablet (800 mg total) by mouth every 8 (eight) hours as needed. 60 tablet 0  . amitriptyline (ELAVIL) 50 MG tablet Take 1 tablet (50 mg total) by mouth at bedtime. 30 tablet 0  . atorvastatin (LIPITOR) 20 MG tablet Take 1 tablet (20 mg total) by mouth daily. (Patient not taking: Reported on 02/24/2020) 30 tablet 0   No facility-administered medications prior to visit.    Allergies  Allergen Reactions  . Amoxicillin     Pain Did it involve swelling of the face/tongue/throat, SOB, or  low BP? No Did it involve sudden or severe rash/hives, skin peeling, or any reaction on the inside of your mouth or nose? No Did you need to seek medical attention at a hospital or doctor's  office? Yes When did it last happen?5 years ago If all above answers are "NO", may proceed with cephalosporin use.   [name] [Propoxyphene N-Acetaminophen] Nausea And Vomiting    Makes her jittery  . Hydrocodone-Acetaminophen Nausea And Vomiting    Upset stomach  . Percocet [Oxycodone-Acetaminophen]     Makes her jittery  . Valium [Diazepam] Other (See Comments)    Makes pt. Feel "out of wack"       Objective:    BP 130/89 (BP Location: Left Arm, Patient Position: Sitting, Cuff Size: Normal)   Pulse 70   Temp (!) 96.5 F (35.8 C) (Temporal)   Ht 5\' 5"  (1.651 m)   Wt 171 lb (77.6 kg)   SpO2 100%   BMI 28.46 kg/m  Wt Readings from Last 3 Encounters:  02/24/20 171 lb (77.6 kg)  09/04/19 160 lb 9.6 oz (72.8 kg)  08/01/19 162 lb 4.8 oz (73.6 kg)    Physical Exam Vitals and nursing note reviewed.  Constitutional:      Appearance: She is well-developed.  HENT:     Head: Normocephalic and atraumatic.  Cardiovascular:     Rate and Rhythm: Normal rate and regular rhythm.     Heart sounds: Normal heart sounds. No murmur heard.  No friction rub. No gallop.   Pulmonary:     Effort: Pulmonary effort is normal. No tachypnea or respiratory distress.     Breath sounds: Normal breath sounds. No decreased breath sounds, wheezing, rhonchi or rales.  Chest:     Chest wall: No tenderness.  Abdominal:     General: Bowel sounds are normal.     Palpations: Abdomen is soft.  Musculoskeletal:        General: Normal range of motion.     Cervical back: Normal range of motion.  Skin:    General: Skin is warm and dry.  Neurological:     Mental Status: She is alert and oriented to person, place, and time.     Cranial Nerves: Cranial nerves are intact.     Sensory: Sensation is intact.      Motor: Motor function is intact.     Coordination: Coordination is intact. Coordination normal.     Gait: Gait is intact.  Psychiatric:        Behavior: Behavior normal. Behavior is cooperative.        Thought Content: Thought content normal.        Judgment: Judgment normal.          Patient has been counseled extensively about nutrition and exercise as well as the importance of adherence with medications and regular follow-up. The patient was given clear instructions to go to ER or return to medical center if symptoms don't improve, worsen or new problems develop. The patient verbalized understanding.   Follow-up: Return in about 4 weeks (around 03/23/2020) for TELE.   [name], FNP-BC [facility] and [facility], [place]   02/29/2020, 7:23 PM

## 2020-02-25 LAB — BASIC METABOLIC PANEL
BUN/Creatinine Ratio: 11 (ref 9–23)
BUN: 10 mg/dL (ref 6–24)
CO2: 25 mmol/L (ref 20–29)
Calcium: 10.4 mg/dL — ABNORMAL HIGH (ref 8.7–10.2)
Chloride: 103 mmol/L (ref 96–106)
Creatinine, Ser: 0.93 mg/dL (ref 0.57–1.00)
GFR calc Af Amer: 81 mL/min/{1.73_m2} (ref >59)
GFR calc non Af Amer: 70 mL/min/{1.73_m2} (ref >59)
Glucose: 99 mg/dL (ref 65–99)
Potassium: 4.2 mmol/L (ref 3.5–5.2)
Sodium: 144 mmol/L (ref 134–144)

## 2020-02-29 ENCOUNTER — Encounter: Payer: Self-pay | Admitting: Nurse Practitioner

## 2020-03-18 ENCOUNTER — Ambulatory Visit: Payer: Medicaid Other | Admitting: Podiatry

## 2020-03-24 ENCOUNTER — Other Ambulatory Visit: Payer: Self-pay | Admitting: Nurse Practitioner

## 2020-03-24 ENCOUNTER — Encounter: Payer: Self-pay | Admitting: Nurse Practitioner

## 2020-03-24 DIAGNOSIS — M792 Neuralgia and neuritis, unspecified: Secondary | ICD-10-CM

## 2020-03-24 DIAGNOSIS — Q828 Other specified congenital malformations of skin: Secondary | ICD-10-CM

## 2020-04-10 ENCOUNTER — Other Ambulatory Visit: Payer: Self-pay

## 2020-04-10 ENCOUNTER — Ambulatory Visit: Payer: Medicaid Other | Attending: Nurse Practitioner | Admitting: Nurse Practitioner

## 2020-04-23 DIAGNOSIS — F603 Borderline personality disorder: Secondary | ICD-10-CM | POA: Diagnosis not present

## 2020-05-01 DIAGNOSIS — F603 Borderline personality disorder: Secondary | ICD-10-CM | POA: Diagnosis not present

## 2020-05-08 DIAGNOSIS — F603 Borderline personality disorder: Secondary | ICD-10-CM | POA: Diagnosis not present

## 2020-05-19 ENCOUNTER — Ambulatory Visit: Payer: Medicaid Other | Admitting: Podiatry

## 2020-05-25 DIAGNOSIS — L810 Postinflammatory hyperpigmentation: Secondary | ICD-10-CM | POA: Diagnosis not present

## 2020-05-25 DIAGNOSIS — L700 Acne vulgaris: Secondary | ICD-10-CM | POA: Diagnosis not present

## 2020-05-26 ENCOUNTER — Other Ambulatory Visit: Payer: Self-pay

## 2020-05-26 ENCOUNTER — Ambulatory Visit: Payer: Medicaid Other | Attending: Nurse Practitioner | Admitting: Nurse Practitioner

## 2020-05-26 ENCOUNTER — Encounter: Payer: Self-pay | Admitting: Nurse Practitioner

## 2020-05-26 DIAGNOSIS — E785 Hyperlipidemia, unspecified: Secondary | ICD-10-CM | POA: Diagnosis not present

## 2020-05-26 DIAGNOSIS — E1142 Type 2 diabetes mellitus with diabetic polyneuropathy: Secondary | ICD-10-CM | POA: Diagnosis not present

## 2020-05-26 DIAGNOSIS — K602 Anal fissure, unspecified: Secondary | ICD-10-CM

## 2020-05-26 MED ORDER — SULFAMETHOXAZOLE-TRIMETHOPRIM 800-160 MG PO TABS: 1.0000 | ORAL_TABLET | Freq: Two times a day (BID) | ORAL | 0 refills | Status: AC

## 2020-05-26 MED ORDER — ATORVASTATIN CALCIUM 20 MG PO TABS: 20.0000 mg | ORAL_TABLET | Freq: Every day | ORAL | 3 refills | Status: DC

## 2020-05-26 NOTE — Progress Notes (Signed)
Virtual Visit via Telephone Note Due to national recommendations of social distancing due to [place] 19, telehealth visit is felt to be most appropriate for this patient at this time.  I discussed the limitations, risks, security and privacy concerns of performing an evaluation and management service by telephone and the availability of in person appointments. I also discussed with the patient that there may be a patient responsible charge related to this service. The patient expressed understanding and agreed to proceed.    I connected with Kendra Griffith on 05/26/20  at   3:50 PM EST  EDT by telephone and verified that I am speaking with the correct person using two identifiers.   Consent I discussed the limitations, risks, security and privacy concerns of performing an evaluation and management service by telephone and the availability of in person appointments. I also discussed with the patient that there may be a patient responsible charge related to this service. The patient expressed understanding and agreed to proceed.   Location of Patient: Private Residence   Location of Provider: [place] [facility] [employer] Office    Persons participating in Telemedicine visit: Kendra Rankins FNP-BC [place]    History of Present Illness: Telemedicine visit for: Follow up She has a past medical history of  Tooth ABSCESS, (08/20/2009), Anemia, Anxiety, Bipolar disorder,  Chronic pain, Clotting disorder, Dental caries, Depression, DM2, GERD, HTN, Sickle cell trait   States Feels like something is leaking from her rectum with foul odor. She does have a history of anal fissures. Needs to follow up with GI.    DM 2 She is currently not taking any oral diabetic agents. Would like to work on her diet and then recheck A1c at next visit. She is aware if 7 or greater will need to start metformin. Started on zetia 10 mg. She endorses a statin intolerance.  Lab Results   Component Value Date   HGBA1C 6.8 (H) 01/07/2020   Lab Results  Component Value Date   LDLCALC 84 01/07/2020    Essential Hypertension She is currently taking HCTZ 25 mg daily. Denies chest pain, shortness of breath, palpitations, lightheadedness, dizziness, headaches or BLE edema. She does not monitor her blood pressure at home.  BP Readings from Last 3 Encounters:  02/24/20 130/89  09/04/19 117/80  08/01/19 127/89   The 10-year ASCVD risk score Mikey Bussing DC Jr., et al., 2013) is: 10.1%   Values used to calculate the score:     Age: 55 years     Sex: Female     Is Non-Hispanic African American: Yes     Diabetic: Yes     Tobacco smoker: Yes     Systolic Blood Pressure: 423 mmHg     Is BP treated: No     HDL Cholesterol: 64 mg/dL     Total Cholesterol: 164 mg/dL Past Medical History:  Diagnosis Date  . ABSCESS, TOOTH 08/20/2009   Qualifier: Diagnosis of  By: Jorene Minors, Scott    . Anemia   . Anxiety   . Bipolar disorder ([place])   . Blood transfusion without reported diagnosis    childbirth  . Chronic pain    feet and back  . Clotting disorder ([place])    childbirth  . Dental caries    lesion soft palate  . Depression   . Diabetes mellitus without complication (HCC)    prediabetes  . GERD (gastroesophageal reflux disease)   . Hypertension   . Injury  right side from jumping off bunkbed  . Pre-diabetes   . Sickle cell trait ([place])   . Wears glasses     Past Surgical History:  Procedure Laterality Date  . BARTHOLIN CYST MARSUPIALIZATION N/A 07/16/2019   Procedure: EXCISION OF BARTHOLIN CYST;  Surgeon: Chancy Milroy, MD;  Location: [facility];  Service: Gynecology;  Laterality: N/A;  . CESAREAN SECTION     three  . COLONOSCOPY    . cyst removal 1997    . FACIAL RECONSTRUCTION SURGERY    . FOOT SURGERY Bilateral   . LESION REMOVAL Left 10/15/2018   Procedure: Lesion Removal left soft palate;  Surgeon: Diona Browner, DDS;  Location: [facility];  Service:  Oral Surgery;  Laterality: Left;  . TOOTH EXTRACTION Bilateral 10/15/2018   Procedure: DENTAL EXTRACTIONS OF TEETH NUMBER TWO, FOUR, FIVE, SIX, SEVEN, EIGHT, NINE, TEN, ELEVEN, TWELVE, THIRTEEN, FOURTEEN, SEVENTEEN, TWENTY-ONE, TWENTY-TWO, TWENTY-THREE, TWENTY-FOUR, TWENTY-FIVE, TWENTY-SIX, TWENTY-SEVEN, TWENTY-EIGHT WITH ALVEOLOPLASTY;  Surgeon: Diona Browner, DDS;  Location: [facility];  Service: Oral Surgery;  Laterality: Bilateral;  . TUBAL LIGATION      Family History  Problem Relation Age of Onset  . Heart disease Mother   . Asthma Sister   . Diabetes Sister   . COPD Sister   . Diabetes Sister   . Diabetes Sister   . Breast cancer Neg Hx   . Colon cancer Neg Hx     Social History   Socioeconomic History  . Marital status: Single    Spouse name: Not on file  . Number of children: 3  . Years of education: 8-9  . Highest education level: Not on file  Occupational History  . Occupation: applying for disability  Tobacco Use  . Smoking status: Current Every Day Smoker    Packs/day: 2.00    Years: 29.00    Pack years: 58.00    Types: Cigarettes  . Smokeless tobacco: Never Used  Vaping Use  . Vaping Use: Never used  Substance and Sexual Activity  . Alcohol use: No    Alcohol/week: 0.0 standard drinks    Comment: Stopped drinking 2013  . Drug use: No  . Sexual activity: Not Currently    Birth control/protection: None  Other Topics Concern  . Not on file  Social History Narrative   Patient lives alone in an apartment on the first floor.  3 children.  Applying for disability.  Education: 8th or 9th grade.   Currently not working   Trying to go back to school online for [employer]   Six Grandchildren   Social Determinants of Health   Financial Resource Strain: Not on file  Food Insecurity: No Food Insecurity  . Worried About Charity fundraiser in the Last Year: Never true  . Ran Out of Food in the Last Year: Never true  Transportation Needs: Unmet Transportation Needs  . Lack of  Transportation (Medical): No  . Lack of Transportation (Non-Medical): Yes  Physical Activity: Not on file  Stress: Not on file  Social Connections: Not on file     Observations/Objective: Awake, alert and oriented x 3   Review of Systems  Constitutional: Negative for fever, malaise/fatigue and weight loss.  HENT: Negative.  Negative for nosebleeds.   Eyes: Negative.  Negative for blurred vision, double vision and photophobia.  Respiratory: Negative.  Negative for cough and shortness of breath.   Cardiovascular: Negative.  Negative for chest pain, palpitations and leg swelling.  Gastrointestinal: Negative.  Negative for  heartburn, nausea and vomiting.  Musculoskeletal: Negative.  Negative for myalgias.  Neurological: Negative.  Negative for dizziness, focal weakness, seizures and headaches.  Psychiatric/Behavioral: Negative.  Negative for suicidal ideas.    Assessment and Plan: Kendra Griffith was seen today for bowel leakage.  Diagnoses and all orders for this visit:  Type 2 diabetes mellitus with diabetic polyneuropathy, without long-term current use of insulin ([place]) -     Ambulatory referral to Ophthalmology Continue blood sugar control as discussed in office today, low carbohydrate diet, and regular physical exercise as tolerated, 150 minutes per week (30 min each day, 5 days per week, or 50 min 3 days per week). Keep blood sugar logs with fasting goal of 90-130 mg/dl, post prandial (after you eat) less than 180.  For Hypoglycemia: BS <60 and Hyperglycemia BS >400; contact the clinic ASAP. Annual eye exams and foot exams are recommended.  Dyslipidemia, goal LDL below 70 -     ezetimibe (ZETIA) 10 MG tablet; Take 1 tablet (10 mg total) by mouth daily. INSTRUCTIONS: Work on a low fat, heart healthy diet and participate in regular aerobic exercise program by working out at least 150 minutes per week; 5 days a week-30 minutes per day. Avoid red meat/beef/steak,  fried foods. junk foods,  sodas, sugary drinks, unhealthy snacking, alcohol and smoking.  Drink at least 80 oz of water per day and monitor your carbohydrate intake daily.    Anal fissure, unspecified -     sulfamethoxazole-trimethoprim (BACTRIM DS) 800-160 MG tablet; Take 1 tablet by mouth 2 (two) times daily for 7 days.      Follow Up Instructions Return in about 3 months (around 08/23/2020).     I discussed the assessment and treatment plan with the patient. The patient was provided an opportunity to ask questions and all were answered. The patient agreed with the plan and demonstrated an understanding of the instructions.   The patient was advised to call back or seek an in-person evaluation if the symptoms worsen or if the condition fails to improve as anticipated.  I provided 14 minutes of non-face-to-face time during this encounter including median intraservice time, reviewing previous notes, labs, imaging, medications and explaining diagnosis and management.  [name], FNP-BC

## 2020-06-01 ENCOUNTER — Encounter: Payer: Self-pay | Admitting: Nurse Practitioner

## 2020-06-01 MED ORDER — EZETIMIBE 10 MG PO TABS
10.0000 mg | ORAL_TABLET | Freq: Every day | ORAL | 3 refills | Status: DC
Start: 2020-06-01 — End: 2020-10-26

## 2020-06-03 ENCOUNTER — Other Ambulatory Visit: Payer: Self-pay | Admitting: Nurse Practitioner

## 2020-06-03 DIAGNOSIS — Z0000 Encounter for general adult medical examination without abnormal findings: Secondary | ICD-10-CM

## 2020-06-10 ENCOUNTER — Encounter: Payer: Self-pay | Admitting: Podiatry

## 2020-06-10 ENCOUNTER — Other Ambulatory Visit: Payer: Self-pay

## 2020-06-10 ENCOUNTER — Ambulatory Visit: Payer: Medicaid Other | Admitting: Podiatry

## 2020-06-10 DIAGNOSIS — M79674 Pain in right toe(s): Secondary | ICD-10-CM

## 2020-06-10 DIAGNOSIS — B351 Tinea unguium: Secondary | ICD-10-CM

## 2020-06-10 DIAGNOSIS — E1142 Type 2 diabetes mellitus with diabetic polyneuropathy: Secondary | ICD-10-CM | POA: Diagnosis not present

## 2020-06-10 DIAGNOSIS — Q828 Other specified congenital malformations of skin: Secondary | ICD-10-CM

## 2020-06-10 DIAGNOSIS — M79675 Pain in left toe(s): Secondary | ICD-10-CM

## 2020-06-17 NOTE — Progress Notes (Signed)
Subjective: Kendra Griffith presents today at risk foot care with history of peripheral neuropathy and painful porokeratotic lesion(s) plantarly b/l and painful mycotic toenails b/l that limit ambulation. Aggravating factors include weightbearing with and without shoe gear. Pain for both is relieved with periodic professional debridement.   Patient c/o tender left 4th toe outside border. She denies any redness, drainage or swelling of digit.  [name], NP is patient's PCP. Last visit was: 05/26/2020.  Allergies  Allergen Reactions  . Amoxicillin     Pain Did it involve swelling of the face/tongue/throat, SOB, or low BP? No Did it involve sudden or severe rash/hives, skin peeling, or any reaction on the inside of your mouth or nose? No Did you need to seek medical attention at a hospital or doctor's office? Yes When did it last happen?5 years ago If all above answers are "NO", may proceed with cephalosporin use.   [name] [Propoxyphene N-Acetaminophen] Nausea And Vomiting    Makes her jittery  . Hydrocodone-Acetaminophen Nausea And Vomiting    Upset stomach  . Percocet [Oxycodone-Acetaminophen]     Makes her jittery  . Valium [Diazepam] Other (See Comments)    Makes pt. Feel "out of wack"    Objective: Kendra Griffith is a pleasant 55 y.o. female WD, WN in NAD. AAO x 3.  There were no vitals filed for this visit. Vascular Examination: Neurovascular status unchanged b/l lower extremities. Capillary fill time to digits <3 seconds b/l lower extremities. Palpable pedal pulses b/l LE. Pedal hair present. Lower extremity skin temperature gradient within normal limits.  Dermatological Examination: Pedal skin with normal turgor, texture and tone bilaterally. No open wounds bilaterally. No interdigital macerations bilaterally. Toenails 1-5 b/l elongated, discolored, dystrophic, thickened, crumbly with subungual debris and tenderness to dorsal palpation. Porokeratotic  lesion(s) submet head 1 right foot, submet head 2 left foot, submet head 5 left foot and submet head 5 right foot. No erythema, no edema, no drainage, no flocculence.  Musculoskeletal: Normal muscle strength 5/5 to all lower extremity muscle groups bilaterally. No pain crepitus or joint limitation noted with ROM b/l. No gross bony deformities bilaterally.  Neurological Examination: Pt has subjective symptoms of neuropathy. Protective sensation decreased with 10 gram monofilament b/l.   Assessment: 1. Pain due to onychomycosis of toenails of both feet   2. Porokeratosis   3. Diabetic peripheral neuropathy associated with type 2 diabetes mellitus ([place])    Plan: -Examined patient. -Patient refused porokeratosis debridement. ABN signed and on chart. Copy given to patient on today's visit. -Continue diabetic foot care principles. -Toenails 1-5 b/l were debrided in length and girth with sterile nail nippers and dremel without iatrogenic bleeding. -Porokeratotic lesions not debrided on today's visit as patient has declines services.   -Patient to report any pedal injuries to medical professional immediately. -Patient to continue soft, supportive shoe gear daily. -Patient/POA to call should there be question/concern in the interim.  Return in about 3 months (around 09/10/2020).  Marzetta Board, DPM

## 2020-07-06 ENCOUNTER — Other Ambulatory Visit: Payer: Medicaid Other

## 2020-07-08 ENCOUNTER — Other Ambulatory Visit: Payer: Self-pay

## 2020-07-08 ENCOUNTER — Ambulatory Visit: Payer: Medicaid Other

## 2020-07-08 ENCOUNTER — Encounter: Payer: Self-pay | Admitting: Nurse Practitioner

## 2020-07-09 ENCOUNTER — Other Ambulatory Visit: Payer: Self-pay | Admitting: Nurse Practitioner

## 2020-07-10 ENCOUNTER — Encounter: Payer: Self-pay | Admitting: Nurse Practitioner

## 2020-07-12 ENCOUNTER — Other Ambulatory Visit: Payer: Self-pay | Admitting: Nurse Practitioner

## 2020-07-12 DIAGNOSIS — K629 Disease of anus and rectum, unspecified: Secondary | ICD-10-CM

## 2020-07-16 ENCOUNTER — Other Ambulatory Visit: Payer: Medicaid Other

## 2020-07-23 ENCOUNTER — Ambulatory Visit
Admission: RE | Admit: 2020-07-23 | Discharge: 2020-07-23 | Disposition: A | Discharge status: Home / Self Care | Payer: Medicaid Other | Source: Ambulatory Visit | Attending: Nurse Practitioner | Admitting: Nurse Practitioner

## 2020-07-23 ENCOUNTER — Other Ambulatory Visit: Payer: Self-pay

## 2020-07-23 DIAGNOSIS — Z1231 Encounter for screening mammogram for malignant neoplasm of breast: Secondary | ICD-10-CM | POA: Diagnosis not present

## 2020-07-23 DIAGNOSIS — Z0000 Encounter for general adult medical examination without abnormal findings: Secondary | ICD-10-CM

## 2020-08-20 ENCOUNTER — Ambulatory Visit: Payer: Medicaid Other | Admitting: Physician Assistant

## 2020-08-27 ENCOUNTER — Encounter: Payer: Self-pay | Admitting: Nurse Practitioner

## 2020-08-27 ENCOUNTER — Ambulatory Visit (INDEPENDENT_AMBULATORY_CARE_PROVIDER_SITE_OTHER): Payer: Medicaid Other | Admitting: Nurse Practitioner

## 2020-08-27 VITALS — BP 114/76 | HR 82 | Ht 65.0 in | Wt 170.6 lb

## 2020-08-27 DIAGNOSIS — K644 Residual hemorrhoidal skin tags: Secondary | ICD-10-CM

## 2020-08-27 DIAGNOSIS — K5900 Constipation, unspecified: Secondary | ICD-10-CM

## 2020-08-27 DIAGNOSIS — R198 Other specified symptoms and signs involving the digestive system and abdomen: Secondary | ICD-10-CM

## 2020-08-27 MED ORDER — HYDROCORTISONE (PERIANAL) 2.5 % EX CREA: 1.0000 "application " | TOPICAL_CREAM | Freq: Every day | CUTANEOUS | 1 refills | Status: DC

## 2020-08-27 NOTE — Progress Notes (Signed)
ASSESSMENT AND PLAN    # 55 yo female with chronic constipation and complaints of chronic rectal odor and periodic clear rectal discharge. Rectal discharge possibly secondary to incomplete rectal emptying further complicated by swollen internal hemorrhoids.   --Stop Senokot for now --Trial of daily Benefiber.  --Trial of Miralax - one capful in 8 oz water daily.  --Treat internal hemorrhoids with Anusol cream per rectum x 10 days. --She is worried about having parasites. Reassurance provided   # Colon cancer screening. Ten year screening colonoscopy due in Sept 2028.    HISTORY OF PRESENT ILLNESS     Chief Complaint : foul odor coming from rectum  Kendra Griffith is a 55 y.o. female with a past medical history significant for DM2, bipolar disorder, clotting disorder, hypertension. See additional PMH below.   Patient is known to Dr. Loletha Carrow from screening colonoscopy September 2018 (diverticulosis).  She is referred by PCP for evaluation of rectal issues.   Kendra Griffith is here with complaints of a foul smell originating from her rectum.  She says the foul smell has been present for years but has gotten progressively worse over the last few years.  The smell is constant.  She has no rectal pain or rectal bleeding.  Periodically she has noticed clear drainage from rectum. No drainage in the last few months yet she still has the odor. She cannot describe the odor but says if permeates her clothing and whole house.  Through Internet research patient is concerned she may have parasites.  Initially she thought the odor was due to a chronic Batholin's cyst / abscess but that was excised in April 2021.  Kendra Griffith has chronic constipation.  Years ago she tried MiraLAX, possibly fiber supplements cannot recall if either helped.  Over the last few months she has been taking Senokot.  She now has a large formed stool almost everyday. She has not seen any blood in her stool.   PREVIOUS EVALUATIONS:    September 2018 screening colonoscopy --complete exam  / BBPS 6 --Right and left sided diverticula    Past Medical History:  Diagnosis Date  . ABSCESS, TOOTH 08/20/2009   Qualifier: Diagnosis of  By: Jorene Minors, Scott    . Anemia   . Anxiety   . Bipolar disorder ([place])   . Blood transfusion without reported diagnosis    childbirth  . Chronic pain    feet and back  . Clotting disorder ([place])    childbirth  . Dental caries    lesion soft palate  . Depression   . Diabetes mellitus without complication (HCC)    prediabetes  . GERD (gastroesophageal reflux disease)   . Hypertension   . Injury    right side from jumping off bunkbed  . Pre-diabetes   . Sickle cell trait ([place])   . Wears glasses      Past Surgical History:  Procedure Laterality Date  . BARTHOLIN CYST MARSUPIALIZATION N/A 07/16/2019   Procedure: EXCISION OF BARTHOLIN CYST;  Surgeon: Chancy Milroy, MD;  Location: [facility];  Service: Gynecology;  Laterality: N/A;  . CESAREAN SECTION     three  . COLONOSCOPY    . cyst removal 1997    . FACIAL RECONSTRUCTION SURGERY    . FOOT SURGERY Bilateral   . LESION REMOVAL Left 10/15/2018   Procedure: Lesion Removal left soft palate;  Surgeon: Diona Browner, DDS;  Location: [facility];  Service: Oral Surgery;  Laterality: Left;  .  TOOTH EXTRACTION Bilateral 10/15/2018   Procedure: DENTAL EXTRACTIONS OF TEETH NUMBER TWO, FOUR, FIVE, SIX, SEVEN, EIGHT, NINE, TEN, ELEVEN, TWELVE, THIRTEEN, FOURTEEN, SEVENTEEN, TWENTY-ONE, TWENTY-TWO, TWENTY-THREE, TWENTY-FOUR, TWENTY-FIVE, TWENTY-SIX, TWENTY-SEVEN, TWENTY-EIGHT WITH ALVEOLOPLASTY;  Surgeon: Diona Browner, DDS;  Location: [facility];  Service: Oral Surgery;  Laterality: Bilateral;  . TUBAL LIGATION     Family History  Problem Relation Age of Onset  . Heart disease Mother   . Asthma Sister   . Diabetes Sister   . COPD Sister   . Diabetes Sister   . Diabetes Sister   . Breast cancer Neg Hx   . Colon cancer Neg Hx    . Esophageal cancer Neg Hx   . Pancreatic cancer Neg Hx   . Liver disease Neg Hx   . Stomach cancer Neg Hx    Social History   Tobacco Use  . Smoking status: Current Every Day Smoker    Packs/day: 2.00    Years: 29.00    Pack years: 58.00    Types: Cigarettes  . Smokeless tobacco: Never Used  Vaping Use  . Vaping Use: Never used  Substance Use Topics  . Alcohol use: No    Alcohol/week: 0.0 standard drinks    Comment: Stopped drinking 2013  . Drug use: No   Current Outpatient Medications  Medication Sig Dispense Refill  . acetaminophen-codeine (TYLENOL #3) 300-30 MG tablet Take 1 tablet by mouth every 8 (eight) hours as needed for moderate pain or severe pain. 60 tablet 0  . albuterol (VENTOLIN HFA) 108 (90 Base) MCG/ACT inhaler Inhale 2 puffs into the lungs every 6 (six) hours as needed for wheezing or shortness of breath. 8 g 2  . amitriptyline (ELAVIL) 75 MG tablet Take 1 tablet (75 mg total) by mouth at bedtime. 90 tablet 1  . atorvastatin (LIPITOR) 20 MG tablet Take 20 mg by mouth daily.    . Biotin 5 MG CAPS Take 5 mg by mouth 3 (three) times daily.    . cholecalciferol (VITAMIN D3) 25 MCG (1000 UT) tablet Take 1,000 Units by mouth daily.    . Clindamycin-Benzoyl Per, Refr, gel APPLY ONE APPLICATION TO AFFECTED AREA TWICE DAILY.    Marland Kitchen ezetimibe (ZETIA) 10 MG tablet Take 1 tablet (10 mg total) by mouth daily. 90 tablet 3  . famotidine (PEPCID) 20 MG tablet Take 1 tablet (20 mg total) by mouth 2 (two) times daily. 180 tablet 1  . ferrous sulfate 324 MG TBEC Take 324 mg by mouth.    Marland Kitchen GLUTAMINE PO Take 500 mg by mouth.    . hydrochlorothiazide (HYDRODIURIL) 25 MG tablet Take 1 tablet (25 mg total) by mouth daily. 90 tablet 0  . hydroquinone 4 % cream Apply topically 2 (two) times daily.    . hydrOXYzine (ATARAX/VISTARIL) 25 MG tablet Take 1 tablet (25 mg total) by mouth 3 (three) times daily as needed. 90 tablet 1  . ibuprofen (ADVIL) 800 MG tablet Take 1 tablet (800 mg  total) by mouth every 8 (eight) hours as needed. 60 tablet 1  . Multiple Vitamin (MULTI-VITAMIN) tablet Take 1 tablet by mouth daily.    . NONFORMULARY OR COMPOUNDED ITEM Peripheral Neuropathy Cream: Bupivacaine 1%, Doxepin 3%, Gabapentin 6%, Pentoxifylline 3%, Topiramate 1% Order faxed to [facility] 1 each 3  . Omega-3 Fatty Acids (OMEGA 3 500) 500 MG CAPS Take 500 mg by mouth daily.    . Sennosides (SENOKOT PO) Take 1 tablet by mouth as needed.    Marland Kitchen  vitamin B-12 (CYANOCOBALAMIN) 500 MCG tablet Take 500 mcg by mouth daily.    . vitamin C (ASCORBIC ACID) 500 MG tablet Take 500 mg by mouth daily.    . Zinc 50 MG CAPS Take 1 capsule by mouth daily.     No current facility-administered medications for this visit.   Allergies  Allergen Reactions  . Amoxicillin     Pain Did it involve swelling of the face/tongue/throat, SOB, or low BP? No Did it involve sudden or severe rash/hives, skin peeling, or any reaction on the inside of your mouth or nose? No Did you need to seek medical attention at a hospital or doctor's office? Yes When did it last happen?5 years ago If all above answers are "NO", may proceed with cephalosporin use.   [name] [Propoxyphene N-Acetaminophen] Nausea And Vomiting    Makes her jittery  . Hydrocodone-Acetaminophen Nausea And Vomiting    Upset stomach  . Percocet [Oxycodone-Acetaminophen]     Makes her jittery  . Valium [Diazepam] Other (See Comments)    Makes pt. Feel "out of wack"     Review of Systems: All systems reviewed and negative except where noted in HPI.    PHYSICAL EXAM :    Wt Readings from Last 3 Encounters:  08/27/20 170 lb 9.6 oz (77.4 kg)  02/24/20 171 lb (77.6 kg)  09/04/19 160 lb 9.6 oz (72.8 kg)    BP 114/76   Pulse 82   Ht 5\' 5"  (1.651 m)   Wt 170 lb 9.6 oz (77.4 kg)   LMP 03/12/2019 (Approximate)   SpO2 98%   BMI 28.39 kg/m  Constitutional:  Pleasant female in no acute distress. Psychiatric: Normal mood and  affect. Behavior is normal. EENT: Pupils normal.  Conjunctivae are normal. No scleral icterus. Neck supple.  Cardiovascular: Normal rate, regular rhythm. No edema Pulmonary/chest: Effort normal and breath sounds normal. No wheezing, rales or rhonchi. Abdominal: Soft, nondistended, nontender. Bowel sounds active throughout. There are no masses palpable. No hepatomegaly. Rectal:  Perianal hemorrhoid tags. No fistula or fissure seen. No evidence for abscess. Sphincter tone seems adequate. On anoscopy there were swollen internal hemorrhoids.  Neurological: Alert and oriented to person place and time. Skin: Skin is warm and dry. No rashes noted.  Tye Savoy, NP  08/27/2020, 12:04 PM  Cc:  Referring Provider [name], NP

## 2020-08-27 NOTE — Patient Instructions (Addendum)
If you are age 55 or older, your body mass index should be between 23-30. Your Body mass index is 28.39 kg/m. If this is out of the aforementioned range listed, please consider follow up with your Primary Care Provider.  If you are age 77 or younger, your body mass index should be between 19-25. Your Body mass index is 28.39 kg/m. If this is out of the aformentioned range listed, please consider follow up with your Primary Care Provider.   We have sent the following medications to your pharmacy for you to pick up at your convenience: Hydrocortisone cream inside rectum nightly for 10 days.  Miralax 1 capful daily in 8 ounces of liquid.   Start Benefiber 2 teaspoons in 8 ounces of liquid daily and may increase to twice daily if tolerated.   Stop Senokot.  Follow up in 4 weeks.

## 2020-08-31 ENCOUNTER — Telehealth: Payer: Self-pay | Admitting: Nurse Practitioner

## 2020-08-31 NOTE — Telephone Encounter (Signed)
Medication: gabapentin (NEURONTIN) 600 MG tablet [[id number]]  DISCONTINUED Apt scheduled 10/26/20  Has the patient contacted their pharmacy? YES  (Agent: If no, request that the patient contact the pharmacy for the refill.) (Agent: If yes, when and what did the pharmacy advise?)  Preferred Pharmacy (with phone number or street name): [facility] ([place]), [place] - [address] [place] ([place]) [place] [address] Phone: [phone] Fax: [phone] Hours: Not open 24 hours    Agent: Please be advised that RX refills may take up to 3 business days. We ask that you follow-up with your pharmacy.

## 2020-08-31 NOTE — Telephone Encounter (Signed)
Contacted patient regarding requested medication gabapentin 600 mg . medication was discontinued 08/08/2013. Patient reports she was using non formulary cream for her feet and it is ineffective to manage foot pain. Patient is taking Tylenol #3 for pain but it is only helping with back pain not bilateral foot pain. Patient reports feet pain is worsening and she is now in the process of moving and would like something to help her pain. Earliest appt available 10/26/20 scheduled. Please advise.

## 2020-09-01 NOTE — Progress Notes (Signed)
____________________________________________________________  Attending physician addendum:  Thank you for sending this case to me. I have reviewed the entire note and agree with the plan.  I agree this does not sound like intestinal infection.  Wilfrid Lund, MD  ____________________________________________________________

## 2020-09-02 MED ORDER — GABAPENTIN 300 MG PO CAPS
300.0000 mg | ORAL_CAPSULE | Freq: Two times a day (BID) | ORAL | 0 refills | Status: DC
Start: 2020-09-02 — End: 2020-09-24

## 2020-09-02 NOTE — Telephone Encounter (Signed)
Contacted pt and made aware that rx has been sent to the pharmacy pt doesn't have any questions or concerns

## 2020-09-02 NOTE — Telephone Encounter (Signed)
Will forward to covering provider.

## 2020-09-23 ENCOUNTER — Ambulatory Visit (INDEPENDENT_AMBULATORY_CARE_PROVIDER_SITE_OTHER): Payer: Medicaid Other | Admitting: Podiatry

## 2020-09-23 ENCOUNTER — Other Ambulatory Visit: Payer: Self-pay

## 2020-09-23 DIAGNOSIS — B351 Tinea unguium: Secondary | ICD-10-CM | POA: Diagnosis not present

## 2020-09-23 DIAGNOSIS — M79674 Pain in right toe(s): Secondary | ICD-10-CM

## 2020-09-23 DIAGNOSIS — M79675 Pain in left toe(s): Secondary | ICD-10-CM

## 2020-09-23 DIAGNOSIS — E1142 Type 2 diabetes mellitus with diabetic polyneuropathy: Secondary | ICD-10-CM | POA: Diagnosis not present

## 2020-09-24 ENCOUNTER — Other Ambulatory Visit: Payer: Self-pay | Admitting: Nurse Practitioner

## 2020-09-24 ENCOUNTER — Ambulatory Visit: Payer: Self-pay | Admitting: Nurse Practitioner

## 2020-09-24 NOTE — Telephone Encounter (Signed)
   Notes to clinic: Pt called to report that she is in the process of moving and cannot find the rest of her current supply. She has only 4 left Review for a early refill    Requested Prescriptions  Pending Prescriptions Disp Refills   gabapentin (NEURONTIN) 300 MG capsule 60 capsule 0    Sig: Take 1 capsule (300 mg total) by mouth 2 (two) times daily.      Neurology: Anticonvulsants - gabapentin Passed - 09/24/2020 11:10 AM      Passed - Valid encounter within last 12 months    Recent Outpatient Visits           4 months ago Type 2 diabetes mellitus with diabetic polyneuropathy, without long-term current use of insulin [facility])   [place], [place] W, NP   7 months ago Type 2 diabetes mellitus with diabetic polyneuropathy, without long-term current use of insulin [facility])   [place], Vernia Buff, NP   8 months ago Need for influenza vaccination   [place], RPH-CPP   9 months ago Essential hypertension   [place], Vernia Buff, NP   1 year ago Anxiety and depression   [place], Vernia Buff, NP       Future Appointments             In 1 month [name], NP [place]

## 2020-09-24 NOTE — Telephone Encounter (Signed)
Pt called to report that she is in the process of moving and cannot find the rest of her current supply. She has only 4 left  gabapentin (NEURONTIN) 300 MG capsule  [place] - [address]  [address] [place] [address]  Phone: [phone] Fax: [phone]

## 2020-09-27 MED ORDER — GABAPENTIN 300 MG PO CAPS: 300.0000 mg | ORAL_CAPSULE | Freq: Two times a day (BID) | ORAL | 0 refills | Status: DC

## 2020-09-29 ENCOUNTER — Encounter: Payer: Self-pay | Admitting: Podiatry

## 2020-09-29 NOTE — Progress Notes (Signed)
Subjective:  Patient ID: Kendra Griffith, female    DOB: July 15, 1965,  MRN: [id number]  55 y.o. female presents with at risk foot care with history of diabetic neuropathy and painful porokeratotic lesion(s) b/l feet and painful mycotic toenails that limit ambulation. Painful toenails interfere with ambulation. Aggravating factors include wearing enclosed shoe gear. Pain is relieved with periodic professional debridement. Painful porokeratotic lesions are aggravated when weightbearing with and without shoegear. Pain is relieved with periodic professional debridement.  Patient states she has been filing her lesions and has also used OTC cream to keep them soft.  Patient does not monitor blood glucose on a daily basis.  Patient does continue to smoke one pack of cigarettes per day.  PCP: [name], NP and last visit was: 05/26/2020.  Review of Systems: Negative except as noted in the HPI.   Allergies  Allergen Reactions   Amoxicillin     Pain Did it involve swelling of the face/tongue/throat, SOB, or low BP? No Did it involve sudden or severe rash/hives, skin peeling, or any reaction on the inside of your mouth or nose? No Did you need to seek medical attention at a hospital or doctor's office? Yes When did it last happen?      5 years ago If all above answers are "NO", may proceed with cephalosporin use.    Darvocet [Propoxyphene N-Acetaminophen] Nausea And Vomiting    Makes her jittery   Hydrocodone-Acetaminophen Nausea And Vomiting    Upset stomach   Percocet [Oxycodone-Acetaminophen]     Makes her jittery   Valium [Diazepam] Other (See Comments)    Makes pt. Feel "out of wack"    Objective:  There were no vitals filed for this visit. Constitutional Patient is a pleasant 55 y.o. African American female WD, WN in NAD. AAO x 3.  Vascular Capillary fill time to digits <3 seconds b/l lower extremities. Palpable DP pulse(s) b/l lower extremities Palpable PT pulse(s) b/l lower  extremities Pedal hair present. Lower extremity skin temperature gradient within normal limits. No pain with calf compression b/l. No edema noted b/l lower extremities. No cyanosis or clubbing noted.  Neurologic Normal speech. Pt has subjective symptoms of neuropathy. Protective sensation decreased with 10 gram monofilament b/l.  Dermatologic Toenails 1-5 b/l elongated, discolored, dystrophic, thickened, crumbly with subungual debris and tenderness to dorsal palpation. Porokeratotic lesion(s) submet head 1 right foot, submet head 2 left foot, submet head 5 left foot, and submet head 5 right foot. No erythema, no edema, no drainage, no fluctuance.  Orthopedic: Normal muscle strength 5/5 to all lower extremity muscle groups bilaterally. No pain crepitus or joint limitation noted with ROM b/l. No gross bony deformities bilaterally. Patient ambulates independent of any assistive aids.   Hemoglobin A1C Latest Ref Rng & Units 01/07/2020  HGBA1C 4.8 - 5.6 % 6.8(H)  Some recent data might be hidden   Assessment:   1. Pain due to onychomycosis of toenails of both feet   2. Diabetic peripheral neuropathy associated with type 2 diabetes mellitus ([place])    Plan:  Patient was evaluated and treated and all questions answered.  Onychomycosis with pain -Nails palliatively debridement as below. -Educated on self-care  Procedure: Nail Debridement Rationale: Pain Type of Debridement: manual, sharp debridement. Instrumentation: Nail nipper, rotary burr. Number of Nails: 10  -Examined patient. -Medicaid ABN signed for this year. Patient refuses services of paring of porokeratotic lesions b/l  today. Patient/POA has copy and copy has been placed in patient chart. -Continue diabetic  foot care principles. -Patient to continue soft, supportive shoe gear daily. -Toenails 1-5 b/l were debrided in length and girth with sterile nail nippers and dremel without iatrogenic bleeding.  -Patient to report any pedal  injuries to medical professional immediately. -Patient/POA to call should there be question/concern in the interim.  Return in about 3 months (around 12/24/2020).  Marzetta Board, DPM

## 2020-10-07 ENCOUNTER — Other Ambulatory Visit: Payer: Self-pay | Admitting: Nurse Practitioner

## 2020-10-07 ENCOUNTER — Encounter: Payer: Self-pay | Admitting: Nurse Practitioner

## 2020-10-07 MED ORDER — HIBICLENS 4 % EX LIQD: Freq: Every day | CUTANEOUS | 0 refills | Status: DC | PRN

## 2020-10-07 MED ORDER — DOXYCYCLINE HYCLATE 100 MG PO TABS: 100.0000 mg | ORAL_TABLET | Freq: Two times a day (BID) | ORAL | 0 refills | Status: AC

## 2020-10-26 ENCOUNTER — Other Ambulatory Visit: Payer: Self-pay

## 2020-10-26 ENCOUNTER — Ambulatory Visit: Payer: Medicaid Other | Attending: Nurse Practitioner | Admitting: Nurse Practitioner

## 2020-10-26 VITALS — BP 131/88 | HR 70 | Resp 16 | Ht 65.0 in | Wt 173.2 lb

## 2020-10-26 DIAGNOSIS — K219 Gastro-esophageal reflux disease without esophagitis: Secondary | ICD-10-CM | POA: Diagnosis not present

## 2020-10-26 DIAGNOSIS — Z720 Tobacco use: Secondary | ICD-10-CM | POA: Diagnosis not present

## 2020-10-26 DIAGNOSIS — I10 Essential (primary) hypertension: Secondary | ICD-10-CM

## 2020-10-26 DIAGNOSIS — F419 Anxiety disorder, unspecified: Secondary | ICD-10-CM | POA: Diagnosis not present

## 2020-10-26 DIAGNOSIS — E1142 Type 2 diabetes mellitus with diabetic polyneuropathy: Secondary | ICD-10-CM | POA: Diagnosis not present

## 2020-10-26 DIAGNOSIS — F32A Depression, unspecified: Secondary | ICD-10-CM | POA: Diagnosis not present

## 2020-10-26 DIAGNOSIS — G621 Alcoholic polyneuropathy: Secondary | ICD-10-CM

## 2020-10-26 DIAGNOSIS — K629 Disease of anus and rectum, unspecified: Secondary | ICD-10-CM

## 2020-10-26 DIAGNOSIS — G894 Chronic pain syndrome: Secondary | ICD-10-CM | POA: Diagnosis not present

## 2020-10-26 DIAGNOSIS — E785 Hyperlipidemia, unspecified: Secondary | ICD-10-CM

## 2020-10-26 DIAGNOSIS — Z1159 Encounter for screening for other viral diseases: Secondary | ICD-10-CM | POA: Diagnosis not present

## 2020-10-26 LAB — POCT GLYCOSYLATED HEMOGLOBIN (HGB A1C): HbA1c, POC (prediabetic range): 6.4 % (ref 5.7–6.4)

## 2020-10-26 MED ORDER — GABAPENTIN 300 MG PO CAPS: 300.0000 mg | ORAL_CAPSULE | Freq: Two times a day (BID) | ORAL | 1 refills | Status: DC

## 2020-10-26 MED ORDER — NICOTINE 21 MG/24HR TD PT24: 21.0000 mg | MEDICATED_PATCH | Freq: Every day | TRANSDERMAL | 0 refills | Status: AC

## 2020-10-26 MED ORDER — AMITRIPTYLINE HCL 75 MG PO TABS: 75.0000 mg | ORAL_TABLET | Freq: Every day | ORAL | 1 refills | Status: DC

## 2020-10-26 MED ORDER — ATORVASTATIN CALCIUM 20 MG PO TABS: 20.0000 mg | ORAL_TABLET | Freq: Every day | ORAL | 1 refills | Status: DC

## 2020-10-26 MED ORDER — POLYETHYLENE GLYCOL 3350 17 GM/SCOOP PO POWD: 17.0000 g | Freq: Two times a day (BID) | ORAL | 1 refills | Status: DC | PRN

## 2020-10-26 MED ORDER — HYDROXYZINE HCL 25 MG PO TABS: 25.0000 mg | ORAL_TABLET | Freq: Three times a day (TID) | ORAL | 1 refills | Status: DC | PRN

## 2020-10-26 NOTE — Progress Notes (Signed)
Assessment & Plan:  Kendra Griffith was seen today for medication refill.  Diagnoses and all orders for this visit:  Rectal abnormality -     polyethylene glycol powder (GLYCOLAX/MIRALAX) 17 GM/SCOOP powder; Take 17 g by mouth 2 (two) times daily as needed.  Type 2 diabetes mellitus with diabetic polyneuropathy, without long-term current use of insulin (HCC) -     Cancel: POCT glucose (manual entry) -     POCT glycosylated hemoglobin (Hb A1C) -     CMP14+EGFR Continue blood sugar control as discussed in office today, low carbohydrate diet, and regular physical exercise as tolerated, 150 minutes per week (30 min each day, 5 days per week, or 50 min 3 days per week). Keep blood sugar logs with fasting goal of 90-130 mg/dl, post prandial (after you eat) less than 180.  For Hypoglycemia: BS <60 and Hyperglycemia BS >400; contact the clinic ASAP. Annual eye exams and foot exams are recommended.   Essential hypertension -     Discontinue: hydrochlorothiazide (HYDRODIURIL) 25 MG tablet; Take 1 tablet (25 mg total) by mouth daily. -     amLODipine (NORVASC) 5 MG tablet; Take 1 tablet (5 mg total) by mouth daily. FOR Blood pressure Continue all antihypertensives as prescribed.  Remember to bring in your blood pressure log with you for your follow up appointment.  DASH/Mediterranean Diets are healthier choices for HTN.    Dyslipidemia, goal LDL below 70 -     atorvastatin (LIPITOR) 20 MG tablet; Take 1 tablet (20 mg total) by mouth daily. -     ezetimibe (ZETIA) 10 MG tablet; Take 1 tablet (10 mg total) by mouth daily. INSTRUCTIONS: Work on a low fat, heart healthy diet and participate in regular aerobic exercise program by working out at least 150 minutes per week; 5 days a week-30 minutes per day. Avoid red meat/beef/steak,  fried foods. junk foods, sodas, sugary drinks, unhealthy snacking, alcohol and smoking.  Drink at least 80 oz of water per day and monitor your carbohydrate intake daily.     Anxiety and depression -     amitriptyline (ELAVIL) 75 MG tablet; Take 1 tablet (75 mg total) by mouth at bedtime. -     hydrOXYzine (ATARAX/VISTARIL) 25 MG tablet; Take 1 tablet (25 mg total) by mouth 3 (three) times daily as needed. -     gabapentin (NEURONTIN) 300 MG capsule; Take 1 capsule (300 mg total) by mouth 2 (two) times daily.  Chronic pain syndrome -     amitriptyline (ELAVIL) 75 MG tablet; Take 1 tablet (75 mg total) by mouth at bedtime. -     gabapentin (NEURONTIN) 300 MG capsule; Take 1 capsule (300 mg total) by mouth 2 (two) times daily.  Need for hepatitis C screening test -     HCV Ab w Reflex to Quant PCR -     Interpretation:  Tobacco use -     nicotine (NICODERM CQ - DOSED IN MG/24 HOURS) 21 mg/24hr patch; Place 1 patch (21 mg total) onto the skin daily. 1. Kendra Griffith continues to smoke 12 cigarettes per day. 2. Kendra Griffith was counseled on the dangers of tobacco use, and was advised to quit. We reviewed specific strategies to maximize success, including removing cigarettes and smoking materials from environment, stress management and support of family/friends as well as pharmacological alternatives. 3. A total of 3 minutes was spent on counseling for smoking cessation and Kendra Griffith is ready to quit and has chosen Nicotine patches to start today.  4.  Kendra Griffith was offered Wellbutrin, Chantix, Nicotine patch, Nicotine gum or lozenges.  Due to out of pocket costs Kendra Griffith was also given smoking cessation support and advised to contact: the Smoking Cessation hotline: 1-800-QUIT-NOW.  Kendra Griffith was also informed of our Smoking cessation classes which are also available through [facility] and [facility] by calling [phone] or visit our website at [email or web address]  5. Will follow up at next scheduled office visit.   Gastroesophageal reflux disease without esophagitis -     famotidine (PEPCID) 20 MG tablet; Take 1 tablet (20 mg total) by mouth 2 (two) times  daily.  Neuropathy, alcoholic (HCC) Taking gabapentin    Patient has been counseled on age-appropriate routine health concerns for screening and prevention. These are reviewed and up-to-date. Referrals have been placed accordingly. Immunizations are up-to-date or declined.    Subjective:   Chief Complaint  Patient presents with   Medication Refill   HPI Kendra Griffith 55 y.o. female presents to office today requesting medication refills and for follow up to rectal odor  I prescribed her hibiclens and doxy for her rectal odor. She has not picked up the hibiclens as of yet so we are unable to evaluate its effectiveness today. She had a complete work up performed by GI for this and workup was unrevealing aside from a dx of chronic constipation for which she was instructed to take miralax and benefiber. She has not picked these up as of yet.      DM 2 Well controlled with diet at this time. She endorses adherence with taking atorvastatin and zetia however LDL not at goal. She takes gabapentin for ETOH neuropathy.  Lab Results  Component Value Date   HGBA1C 6.4 10/26/2020    Lab Results  Component Value Date   LDLCALC 84 01/07/2020     HTN Blood pressure is well controlled  however due to chronic constipation I will dc HCTZ and start amlodipine 5 mg daily. Denies chest pain, shortness of breath, palpitations, lightheadedness, dizziness, headaches or BLE edema.  She does continue to smoke. Would like to quit.  BP Readings from Last 3 Encounters:  10/26/20 131/88  08/27/20 114/76  02/24/20 130/89      Review of Systems  Constitutional:  Negative for fever, malaise/fatigue and weight loss.  HENT: Negative.  Negative for nosebleeds.   Eyes: Negative.  Negative for blurred vision, double vision and photophobia.  Respiratory: Negative.  Negative for cough and shortness of breath.   Cardiovascular: Negative.  Negative for chest pain, palpitations and leg swelling.  Gastrointestinal:   Positive for heartburn. Negative for abdominal pain, blood in stool, constipation, diarrhea, melena, nausea and vomiting.       SEE HPI  Musculoskeletal:  Positive for myalgias.       Chronic pain syndrome  Neurological:  Positive for tingling and sensory change. Negative for dizziness, focal weakness, seizures and headaches.  Psychiatric/Behavioral:  Positive for depression. Negative for suicidal ideas. The patient is nervous/anxious.    Past Medical History:  Diagnosis Date   ABSCESS, TOOTH 08/20/2009   Qualifier: Diagnosis of  By: Jorene Minors, Scott     Anemia    Anxiety    Bipolar disorder ([place])    Blood transfusion without reported diagnosis    childbirth   Chronic pain    feet and back   Clotting disorder ([place])    childbirth   Dental caries    lesion soft palate   Depression    Diabetes mellitus without  complication ([place])    prediabetes   GERD (gastroesophageal reflux disease)    Hypertension    Injury    right side from jumping off bunkbed   Pre-diabetes    Sickle cell trait ([place])    Wears glasses     Past Surgical History:  Procedure Laterality Date   BARTHOLIN CYST MARSUPIALIZATION N/A 07/16/2019   Procedure: EXCISION OF BARTHOLIN CYST;  Surgeon: Chancy Milroy, MD;  Location: [place];  Service: Gynecology;  Laterality: N/A;   CESAREAN SECTION     three   COLONOSCOPY     cyst removal 1997     FACIAL RECONSTRUCTION SURGERY     FOOT SURGERY Bilateral    LESION REMOVAL Left 10/15/2018   Procedure: Lesion Removal left soft palate;  Surgeon: Diona Browner, DDS;  Location: [place];  Service: Oral Surgery;  Laterality: Left;   TOOTH EXTRACTION Bilateral 10/15/2018   Procedure: DENTAL EXTRACTIONS OF TEETH NUMBER TWO, FOUR, FIVE, SIX, SEVEN, EIGHT, NINE, TEN, ELEVEN, TWELVE, THIRTEEN, FOURTEEN, SEVENTEEN, TWENTY-ONE, TWENTY-TWO, TWENTY-THREE, TWENTY-FOUR, TWENTY-FIVE, TWENTY-SIX, TWENTY-SEVEN, TWENTY-EIGHT WITH ALVEOLOPLASTY;  Surgeon: Diona Browner, DDS;   Location: [place];  Service: Oral Surgery;  Laterality: Bilateral;   TUBAL LIGATION      Family History  Problem Relation Age of Onset   Heart disease Mother    Asthma Sister    Diabetes Sister    COPD Sister    Diabetes Sister    Diabetes Sister    Breast cancer Neg Hx    Colon cancer Neg Hx    Esophageal cancer Neg Hx    Pancreatic cancer Neg Hx    Liver disease Neg Hx    Stomach cancer Neg Hx     Social History Reviewed with no changes to be made today.   Outpatient Medications Prior to Visit  Medication Sig Dispense Refill   acetaminophen-codeine (TYLENOL #3) 300-30 MG tablet Take 1 tablet by mouth every 8 (eight) hours as needed for moderate pain or severe pain. 60 tablet 0   albuterol (VENTOLIN HFA) 108 (90 Base) MCG/ACT inhaler Inhale 2 puffs into the lungs every 6 (six) hours as needed for wheezing or shortness of breath. 8 g 2   Biotin 5 MG CAPS Take 5 mg by mouth 3 (three) times daily.     chlorhexidine (HIBICLENS) 4 % external liquid Apply topically daily as needed. Use when bathing or showering 946 mL 0   cholecalciferol (VITAMIN D3) 25 MCG (1000 UT) tablet Take 1,000 Units by mouth daily.     Clindamycin-Benzoyl Per, Refr, gel APPLY ONE APPLICATION TO AFFECTED AREA TWICE DAILY. (Patient not taking: Reported on 10/26/2020)     ferrous sulfate 324 MG TBEC Take 324 mg by mouth.     GLUTAMINE PO Take 500 mg by mouth.     hydrocortisone (ANUSOL-HC) 2.5 % rectal cream Place 1 application rectally at bedtime. 30 g 1   hydroquinone 4 % cream Apply topically 2 (two) times daily.     ibuprofen (ADVIL) 800 MG tablet Take 1 tablet (800 mg total) by mouth every 8 (eight) hours as needed. (Patient not taking: Reported on 10/26/2020) 60 tablet 1   Multiple Vitamin (MULTI-VITAMIN) tablet Take 1 tablet by mouth daily.     NONFORMULARY OR COMPOUNDED ITEM Peripheral Neuropathy Cream: Bupivacaine 1%, Doxepin 3%, Gabapentin 6%, Pentoxifylline 3%, Topiramate 1% Order faxed to [place] 1 each 3   Omega-3 Fatty Acids (OMEGA 3 500) 500 MG CAPS Take 500 mg by  mouth daily.     vitamin B-12 (CYANOCOBALAMIN) 500 MCG tablet Take 500 mcg by mouth daily.     vitamin C (ASCORBIC ACID) 500 MG tablet Take 500 mg by mouth daily.     Zinc 50 MG CAPS Take 1 capsule by mouth daily.     amitriptyline (ELAVIL) 75 MG tablet Take 1 tablet (75 mg total) by mouth at bedtime. 90 tablet 1   atorvastatin (LIPITOR) 20 MG tablet Take 20 mg by mouth daily.     ezetimibe (ZETIA) 10 MG tablet Take 1 tablet (10 mg total) by mouth daily. 90 tablet 3   famotidine (PEPCID) 20 MG tablet Take 1 tablet (20 mg total) by mouth 2 (two) times daily. 180 tablet 1   gabapentin (NEURONTIN) 300 MG capsule Take 1 capsule (300 mg total) by mouth 2 (two) times daily. 60 capsule 0   hydrochlorothiazide (HYDRODIURIL) 25 MG tablet Take 1 tablet (25 mg total) by mouth daily. 90 tablet 0   hydrOXYzine (ATARAX/VISTARIL) 25 MG tablet Take 1 tablet (25 mg total) by mouth 3 (three) times daily as needed. 90 tablet 1   Sennosides (SENOKOT PO) Take 1 tablet by mouth as needed.     No facility-administered medications prior to visit.    Allergies  Allergen Reactions   Amoxicillin     Pain Did it involve swelling of the face/tongue/throat, SOB, or low BP? No Did it involve sudden or severe rash/hives, skin peeling, or any reaction on the inside of your mouth or nose? No Did you need to seek medical attention at a hospital or doctor's office? Yes When did it last happen?      5 years ago If all above answers are "NO", may proceed with cephalosporin use.    Darvocet [Propoxyphene N-Acetaminophen] Nausea And Vomiting    Makes her jittery   Hydrocodone-Acetaminophen Nausea And Vomiting    Upset stomach   Percocet [Oxycodone-Acetaminophen]     Makes her jittery   Valium [Diazepam] Other (See Comments)    Makes pt. Feel "out of wack"       Objective:    BP 131/88   Pulse 70   Resp 16   Ht _0  (1.651 m)   Wt  173 lb 3.2 oz (78.6 kg)   LMP 03/12/2019 (Approximate)   SpO2 100%   BMI 28.82 kg/m  Wt Readings from Last 3 Encounters:  10/26/20 173 lb 3.2 oz (78.6 kg)  08/27/20 170 lb 9.6 oz (77.4 kg)  02/24/20 171 lb (77.6 kg)    Physical Exam Vitals and nursing note reviewed.  Constitutional:      Appearance: She is well-developed.  HENT:     Head: Normocephalic and atraumatic.  Cardiovascular:     Rate and Rhythm: Normal rate and regular rhythm.     Heart sounds: Normal heart sounds. No murmur heard.   No friction rub. No gallop.  Pulmonary:     Effort: Pulmonary effort is normal. No tachypnea or respiratory distress.     Breath sounds: Normal breath sounds. No decreased breath sounds, wheezing, rhonchi or rales.  Chest:     Chest wall: No tenderness.  Abdominal:     General: Bowel sounds are normal.     Palpations: Abdomen is soft.  Musculoskeletal:        General: Normal range of motion.     Cervical back: Normal range of motion.  Skin:    General: Skin is warm and dry.  Neurological:  Mental Status: She is alert and oriented to person, place, and time.     Coordination: Coordination normal.  Psychiatric:        Behavior: Behavior normal. Behavior is cooperative.        Thought Content: Thought content normal.        Judgment: Judgment normal.         Patient has been counseled extensively about nutrition and exercise as well as the importance of adherence with medications and regular follow-up. The patient was given clear instructions to go to ER or return to medical center if symptoms don't improve, worsen or new problems develop. The patient verbalized understanding.   Follow-up: Return for PAP SMEAR.   [name], FNP-BC [facility] and [place], [place]   10/27/2020, 10:52 AM

## 2020-10-27 ENCOUNTER — Encounter: Payer: Self-pay | Admitting: Nurse Practitioner

## 2020-10-27 LAB — CMP14+EGFR
ALT: 12 IU/L (ref 0–32)
AST: 16 IU/L (ref 0–40)
Albumin/Globulin Ratio: 1.8 (ref 1.2–2.2)
Albumin: 4.4 g/dL (ref 3.8–4.9)
Alkaline Phosphatase: 97 IU/L (ref 44–121)
BUN/Creatinine Ratio: 14 (ref 9–23)
BUN: 14 mg/dL (ref 6–24)
Bilirubin Total: <0.2 mg/dL (ref 0.0–1.2)
CO2: 25 mmol/L (ref 20–29)
Calcium: 9.6 mg/dL (ref 8.7–10.2)
Chloride: 103 mmol/L (ref 96–106)
Creatinine, Ser: 0.99 mg/dL (ref 0.57–1.00)
Globulin, Total: 2.5 g/dL (ref 1.5–4.5)
Glucose: 111 mg/dL — ABNORMAL HIGH (ref 65–99)
Potassium: 4.2 mmol/L (ref 3.5–5.2)
Sodium: 140 mmol/L (ref 134–144)
Total Protein: 6.9 g/dL (ref 6.0–8.5)
eGFR: 68 mL/min/{1.73_m2} (ref >59)

## 2020-10-27 LAB — HCV INTERPRETATION

## 2020-10-27 LAB — HCV AB W REFLEX TO QUANT PCR: HCV Ab: <0.1 s/co ratio (ref 0.0–0.9)

## 2020-10-27 MED ORDER — AMLODIPINE BESYLATE 5 MG PO TABS: 5.0000 mg | ORAL_TABLET | Freq: Every day | ORAL | 0 refills | Status: DC

## 2020-10-27 MED ORDER — EZETIMIBE 10 MG PO TABS: 10.0000 mg | ORAL_TABLET | Freq: Every day | ORAL | 3 refills | Status: DC

## 2020-10-27 MED ORDER — FAMOTIDINE 20 MG PO TABS: 20.0000 mg | ORAL_TABLET | Freq: Two times a day (BID) | ORAL | 1 refills | Status: DC

## 2020-10-27 MED ORDER — HYDROCHLOROTHIAZIDE 25 MG PO TABS: 25.0000 mg | ORAL_TABLET | Freq: Every day | ORAL | 0 refills | Status: DC

## 2020-11-20 ENCOUNTER — Ambulatory Visit: Payer: Medicaid Other | Admitting: Nurse Practitioner

## 2020-12-01 ENCOUNTER — Ambulatory Visit: Payer: Medicaid Other | Admitting: Nurse Practitioner

## 2020-12-29 ENCOUNTER — Other Ambulatory Visit: Payer: Self-pay | Admitting: Nurse Practitioner

## 2020-12-29 DIAGNOSIS — I10 Essential (primary) hypertension: Secondary | ICD-10-CM

## 2020-12-29 DIAGNOSIS — G894 Chronic pain syndrome: Secondary | ICD-10-CM

## 2020-12-29 NOTE — Telephone Encounter (Signed)
Requested medication (s) are due for refill today: expired medication  Requested medication (s) are on the active medication list: yes  Last refill:  02/24/20 #60 0 refills  Future visit scheduled: yes in 2 weeks  Notes to clinic:  not delegated per protocol. Prescription MME cannot be calculated for this prescription. Enter discrete sig details to calculate prescription MME.     Requested Prescriptions  Pending Prescriptions Disp Refills   acetaminophen-codeine (TYLENOL #3) 300-30 MG tablet [Pharmacy Med Name: Acetaminophen-Codeine #3 300-30 MG Oral Tablet] 60 tablet 0    Sig: TAKE 1 TABLET BY MOUTH EVERY 8 HOURS AS NEEDED FOR MODERATE PAIN OR  SEVERE  PAIN     Not Delegated - Analgesics:  Opioid Agonist Combinations Failed - 12/29/2020 11:27 AM      Failed - This refill cannot be delegated      Failed - Urine Drug Screen completed in last 360 days      Passed - Valid encounter within last 6 months    Recent Outpatient Visits           2 months ago Rectal abnormality   [place], [place] W, NP   7 months ago Type 2 diabetes mellitus with diabetic polyneuropathy, without long-term current use of insulin ([place])   [place], [place] W, NP   10 months ago Type 2 diabetes mellitus with diabetic polyneuropathy, without long-term current use of insulin [facility])   [place], Vernia Buff, NP   11 months ago Need for influenza vaccination   [place], RPH-CPP   1 year ago Essential hypertension   [place], Vernia Buff, NP       Future Appointments             In 2 weeks [name], NP [place]            Signed Prescriptions Disp Refills   amLODipine (NORVASC) 5 MG tablet 90 tablet 0    Sig: Take 1 tablet by mouth once daily for blood  pressure     Cardiovascular:  Calcium Channel Blockers Passed - 12/29/2020 11:27 AM      Passed - Last BP in normal range    BP Readings from Last 1 Encounters:  10/26/20 131/88          Passed - Valid encounter within last 6 months    Recent Outpatient Visits           2 months ago Rectal abnormality   [place], [place] W, NP   7 months ago Type 2 diabetes mellitus with diabetic polyneuropathy, without long-term current use of insulin ([place])   [place], [place] W, NP   10 months ago Type 2 diabetes mellitus with diabetic polyneuropathy, without long-term current use of insulin [facility])   [place], Vernia Buff, NP   11 months ago Need for influenza vaccination   [place], [place], RPH-CPP   1 year ago Essential hypertension   [place] [name], NP       Future Appointments             In 2 weeks [name], NP [facility]  [place]

## 2020-12-29 NOTE — Telephone Encounter (Signed)
Future visit in 2 weeks  

## 2021-01-15 ENCOUNTER — Other Ambulatory Visit: Payer: Self-pay

## 2021-01-15 ENCOUNTER — Ambulatory Visit: Payer: Medicaid Other | Attending: Nurse Practitioner | Admitting: Nurse Practitioner

## 2021-01-15 ENCOUNTER — Other Ambulatory Visit (HOSPITAL_COMMUNITY)
Admission: RE | Admit: 2021-01-15 | Discharge: 2021-01-15 | Disposition: A | Discharge status: Home / Self Care | Payer: Medicaid Other | Source: Ambulatory Visit | Attending: Nurse Practitioner | Admitting: Nurse Practitioner

## 2021-01-15 ENCOUNTER — Encounter: Payer: Self-pay | Admitting: Nurse Practitioner

## 2021-01-15 VITALS — BP 152/93 | HR 73 | Ht 65.0 in | Wt 168.0 lb

## 2021-01-15 DIAGNOSIS — Z880 Allergy status to penicillin: Secondary | ICD-10-CM | POA: Insufficient documentation

## 2021-01-15 DIAGNOSIS — D573 Sickle-cell trait: Secondary | ICD-10-CM | POA: Diagnosis not present

## 2021-01-15 DIAGNOSIS — E1142 Type 2 diabetes mellitus with diabetic polyneuropathy: Secondary | ICD-10-CM | POA: Insufficient documentation

## 2021-01-15 DIAGNOSIS — Z114 Encounter for screening for human immunodeficiency virus [HIV]: Secondary | ICD-10-CM | POA: Diagnosis not present

## 2021-01-15 DIAGNOSIS — Z885 Allergy status to narcotic agent status: Secondary | ICD-10-CM | POA: Diagnosis not present

## 2021-01-15 DIAGNOSIS — Z23 Encounter for immunization: Secondary | ICD-10-CM | POA: Insufficient documentation

## 2021-01-15 DIAGNOSIS — Z79899 Other long term (current) drug therapy: Secondary | ICD-10-CM | POA: Diagnosis not present

## 2021-01-15 DIAGNOSIS — Z888 Allergy status to other drugs, medicaments and biological substances status: Secondary | ICD-10-CM | POA: Diagnosis not present

## 2021-01-15 DIAGNOSIS — Z124 Encounter for screening for malignant neoplasm of cervix: Secondary | ICD-10-CM

## 2021-01-15 DIAGNOSIS — Z122 Encounter for screening for malignant neoplasm of respiratory organs: Secondary | ICD-10-CM | POA: Insufficient documentation

## 2021-01-15 DIAGNOSIS — Z833 Family history of diabetes mellitus: Secondary | ICD-10-CM | POA: Diagnosis not present

## 2021-01-15 DIAGNOSIS — Z01419 Encounter for gynecological examination (general) (routine) without abnormal findings: Secondary | ICD-10-CM | POA: Insufficient documentation

## 2021-01-15 NOTE — Progress Notes (Signed)
Assessment & Plan:  Kendra Griffith was seen today for gynecologic exam.  Diagnoses and all orders for this visit:  Encounter for Papanicolaou smear for cervical cancer screening -     Cytology - PAP -     Cervicovaginal ancillary only  Encounter for screening for HIV -     HIV antibody (with reflex)  Encounter for screening for lung cancer -     CT CHEST LUNG CA SCREEN LOW DOSE W/O CM; Future  Need for immunization against influenza -     Flu Vaccine QUAD 83mo+IM (Fluarix, Fluzone & Alfiuria Quad PF)  Type 2 diabetes mellitus with diabetic polyneuropathy, without long-term current use of insulin (HCC) -     Microalbumin / creatinine urine ratio   Patient has been counseled on age-appropriate routine health concerns for screening and prevention. These are reviewed and up-to-date. Referrals have been placed accordingly. Immunizations are up-to-date or declined.    Subjective:   Chief Complaint  Patient presents with   Gynecologic Exam    Kendra Griffith 55 y.o. female presents to office today for well woman exam.  She is inquiring about getting her CT Lung cancer screening scheduled. We will have the nurse schedule this for her.     Review of Systems  Constitutional: Negative.  Negative for chills, fever, malaise/fatigue and weight loss.  Respiratory: Negative.  Negative for cough, shortness of breath and wheezing.   Cardiovascular: Negative.  Negative for chest pain, orthopnea and leg swelling.  Gastrointestinal:  Negative for abdominal pain.  Genitourinary: Negative.  Negative for flank pain.  Skin: Negative.  Negative for rash.  Psychiatric/Behavioral:  Negative for suicidal ideas.    Past Medical History:  Diagnosis Date   ABSCESS, TOOTH 08/20/2009   Qualifier: Diagnosis of  By: Jorene Minors, Scott     Anemia    Anxiety    Bipolar disorder ([place])    Blood transfusion without reported diagnosis    childbirth   Chronic pain    feet and back   Clotting disorder ([place])     childbirth   Dental caries    lesion soft palate   Depression    Diabetes mellitus without complication ([place])    prediabetes   GERD (gastroesophageal reflux disease)    Hypertension    Injury    right side from jumping off bunkbed   Pre-diabetes    Sickle cell trait ([place])    Wears glasses     Past Surgical History:  Procedure Laterality Date   BARTHOLIN CYST MARSUPIALIZATION N/A 07/16/2019   Procedure: EXCISION OF BARTHOLIN CYST;  Surgeon: Chancy Milroy, MD;  Location: [place];  Service: Gynecology;  Laterality: N/A;   CESAREAN SECTION     three   COLONOSCOPY     cyst removal 1997     FACIAL RECONSTRUCTION SURGERY     FOOT SURGERY Bilateral    LESION REMOVAL Left 10/15/2018   Procedure: Lesion Removal left soft palate;  Surgeon: Diona Browner, DDS;  Location: [place];  Service: Oral Surgery;  Laterality: Left;   TOOTH EXTRACTION Bilateral 10/15/2018   Procedure: DENTAL EXTRACTIONS OF TEETH NUMBER TWO, FOUR, FIVE, SIX, SEVEN, EIGHT, NINE, TEN, ELEVEN, TWELVE, THIRTEEN, FOURTEEN, SEVENTEEN, TWENTY-ONE, TWENTY-TWO, TWENTY-THREE, TWENTY-FOUR, TWENTY-FIVE, TWENTY-SIX, TWENTY-SEVEN, TWENTY-EIGHT WITH ALVEOLOPLASTY;  Surgeon: Diona Browner, DDS;  Location: [place];  Service: Oral Surgery;  Laterality: Bilateral;   TUBAL LIGATION      Family History  Problem Relation Age of Onset   Heart disease  Mother    Asthma Sister    Diabetes Sister    COPD Sister    Diabetes Sister    Diabetes Sister    Breast cancer Neg Hx    Colon cancer Neg Hx    Esophageal cancer Neg Hx    Pancreatic cancer Neg Hx    Liver disease Neg Hx    Stomach cancer Neg Hx     Social History Reviewed with no changes to be made today.   Outpatient Medications Prior to Visit  Medication Sig Dispense Refill   acetaminophen-codeine (TYLENOL #3) 300-30 MG tablet TAKE 1 TABLET BY MOUTH EVERY 8 HOURS AS NEEDED FOR MODERATE PAIN OR  SEVERE  PAIN 30 tablet 0   albuterol (VENTOLIN HFA) 108 (90 Base)  MCG/ACT inhaler Inhale 2 puffs into the lungs every 6 (six) hours as needed for wheezing or shortness of breath. 8 g 2   amitriptyline (ELAVIL) 75 MG tablet Take 1 tablet (75 mg total) by mouth at bedtime. 90 tablet 1   amLODipine (NORVASC) 5 MG tablet Take 1 tablet by mouth once daily for blood pressure 90 tablet 0   atorvastatin (LIPITOR) 20 MG tablet Take 1 tablet (20 mg total) by mouth daily. 90 tablet 1   Biotin 5 MG CAPS Take 5 mg by mouth 3 (three) times daily.     chlorhexidine (HIBICLENS) 4 % external liquid Apply topically daily as needed. Use when bathing or showering 946 mL 0   cholecalciferol (VITAMIN D3) 25 MCG (1000 UT) tablet Take 1,000 Units by mouth daily.     ezetimibe (ZETIA) 10 MG tablet Take 1 tablet (10 mg total) by mouth daily. 90 tablet 3   famotidine (PEPCID) 20 MG tablet Take 1 tablet (20 mg total) by mouth 2 (two) times daily. 180 tablet 1   ferrous sulfate 324 MG TBEC Take 324 mg by mouth.     gabapentin (NEURONTIN) 300 MG capsule Take 1 capsule (300 mg total) by mouth 2 (two) times daily. 180 capsule 1   GLUTAMINE PO Take 500 mg by mouth.     hydrocortisone (ANUSOL-HC) 2.5 % rectal cream Place 1 application rectally at bedtime. 30 g 1   hydroquinone 4 % cream Apply topically 2 (two) times daily.     ibuprofen (ADVIL) 800 MG tablet Take 1 tablet (800 mg total) by mouth every 8 (eight) hours as needed. 60 tablet 1   Multiple Vitamin (MULTI-VITAMIN) tablet Take 1 tablet by mouth daily.     NONFORMULARY OR COMPOUNDED ITEM Peripheral Neuropathy Cream: Bupivacaine 1%, Doxepin 3%, Gabapentin 6%, Pentoxifylline 3%, Topiramate 1% Order faxed to [facility] 1 each 3   Omega-3 Fatty Acids (OMEGA 3 500) 500 MG CAPS Take 500 mg by mouth daily.     polyethylene glycol powder (GLYCOLAX/MIRALAX) 17 GM/SCOOP powder Take 17 g by mouth 2 (two) times daily as needed. 3350 g 1   vitamin B-12 (CYANOCOBALAMIN) 500 MCG tablet Take 500 mcg by mouth daily.     vitamin C (ASCORBIC  ACID) 500 MG tablet Take 500 mg by mouth daily.     Zinc 50 MG CAPS Take 1 capsule by mouth daily.     hydrOXYzine (ATARAX/VISTARIL) 25 MG tablet Take 1 tablet (25 mg total) by mouth 3 (three) times daily as needed. (Patient not taking: Reported on 01/15/2021) 90 tablet 1   No facility-administered medications prior to visit.    Allergies  Allergen Reactions   Amoxicillin     Pain Did it involve  swelling of the face/tongue/throat, SOB, or low BP? No Did it involve sudden or severe rash/hives, skin peeling, or any reaction on the inside of your mouth or nose? No Did you need to seek medical attention at a hospital or doctor's office? Yes When did it last happen?      5 years ago If all above answers are "NO", may proceed with cephalosporin use.    Darvocet [Propoxyphene N-Acetaminophen] Nausea And Vomiting    Makes her jittery   Hydrocodone-Acetaminophen Nausea And Vomiting    Upset stomach   Percocet [Oxycodone-Acetaminophen]     Makes her jittery   Valium [Diazepam] Other (See Comments)    Makes pt. Feel "out of wack"       Objective:    BP (!) 152/93   Pulse 73   Ht 5\' 5"  (1.651 m)   Wt 168 lb (76.2 kg)   LMP 03/12/2019 (Approximate)   SpO2 100%   BMI 27.96 kg/m  Wt Readings from Last 3 Encounters:  01/15/21 168 lb (76.2 kg)  10/26/20 173 lb 3.2 oz (78.6 kg)  08/27/20 170 lb 9.6 oz (77.4 kg)    Physical Exam Exam conducted with a chaperone present.  Constitutional:      Appearance: She is well-developed.  HENT:     Head: Normocephalic.  Cardiovascular:     Rate and Rhythm: Normal rate and regular rhythm.     Heart sounds: Normal heart sounds.  Pulmonary:     Effort: Pulmonary effort is normal.     Breath sounds: Normal breath sounds.  Abdominal:     General: Bowel sounds are normal.     Palpations: Abdomen is soft.     Hernia: There is no hernia in the left inguinal area.  Genitourinary:    Exam position: Lithotomy position.     Labia:        Right: No  rash, tenderness, lesion or injury.        Left: No rash, tenderness, lesion or injury.      Vagina: Normal. No signs of injury and foreign body. No vaginal discharge, erythema, tenderness or bleeding.     Cervix: No cervical motion tenderness or friability.     Uterus: Not deviated and not enlarged.      Adnexa:        Right: No mass, tenderness or fullness.         Left: No mass, tenderness or fullness.       Rectum: Normal. No external hemorrhoid.  Lymphadenopathy:     Lower Body: No right inguinal adenopathy. No left inguinal adenopathy.  Skin:    General: Skin is warm and dry.  Neurological:     Mental Status: She is alert and oriented to person, place, and time.  Psychiatric:        Behavior: Behavior normal.        Thought Content: Thought content normal.        Judgment: Judgment normal.         Patient has been counseled extensively about nutrition and exercise as well as the importance of adherence with medications and regular follow-up. The patient was given clear instructions to go to ER or return to medical center if symptoms don't improve, worsen or new problems develop. The patient verbalized understanding.   Follow-up: Return in about 3 months (around 04/17/2021).   [name], FNP-BC [facility] and [facility], [place]   01/15/2021, 1:13 PM

## 2021-01-17 LAB — MICROALBUMIN / CREATININE URINE RATIO
Creatinine, Urine: 16.9 mg/dL
Microalb/Creat Ratio: <18 mg/g creat (ref 0–29)
Microalbumin, Urine: <3 ug/mL

## 2021-01-17 LAB — HIV ANTIBODY (ROUTINE TESTING W REFLEX): HIV Screen 4th Generation wRfx: NONREACTIVE

## 2021-01-18 LAB — CERVICOVAGINAL ANCILLARY ONLY
Bacterial Vaginitis (gardnerella): NEGATIVE
Candida Glabrata: NEGATIVE
Candida Vaginitis: NEGATIVE
Chlamydia: NEGATIVE
Comment: NEGATIVE
Comment: NEGATIVE
Comment: NEGATIVE
Comment: NEGATIVE
Comment: NEGATIVE
Comment: NORMAL
Neisseria Gonorrhea: NEGATIVE
Trichomonas: NEGATIVE

## 2021-01-18 LAB — CYTOLOGY - PAP
Comment: NEGATIVE
Diagnosis: NEGATIVE
High risk HPV: NEGATIVE

## 2021-01-25 ENCOUNTER — Ambulatory Visit (HOSPITAL_COMMUNITY): Payer: Medicaid Other

## 2021-01-25 ENCOUNTER — Other Ambulatory Visit: Payer: Self-pay | Admitting: Nurse Practitioner

## 2021-01-25 DIAGNOSIS — E785 Hyperlipidemia, unspecified: Secondary | ICD-10-CM

## 2021-01-25 DIAGNOSIS — F32A Anxiety disorder, unspecified: Secondary | ICD-10-CM

## 2021-01-25 DIAGNOSIS — F419 Anxiety disorder, unspecified: Secondary | ICD-10-CM

## 2021-01-25 DIAGNOSIS — K219 Gastro-esophageal reflux disease without esophagitis: Secondary | ICD-10-CM

## 2021-01-25 DIAGNOSIS — G894 Chronic pain syndrome: Secondary | ICD-10-CM

## 2021-01-25 DIAGNOSIS — F17200 Nicotine dependence, unspecified, uncomplicated: Secondary | ICD-10-CM

## 2021-01-25 MED ORDER — FAMOTIDINE 20 MG PO TABS: 20.0000 mg | ORAL_TABLET | Freq: Two times a day (BID) | ORAL | 1 refills | Status: DC

## 2021-01-25 MED ORDER — IBUPROFEN 800 MG PO TABS
800.0000 mg | ORAL_TABLET | Freq: Three times a day (TID) | ORAL | 1 refills | Status: DC | PRN
Start: 2021-01-25 — End: 2022-08-31

## 2021-01-25 MED ORDER — ALBUTEROL SULFATE HFA 108 (90 BASE) MCG/ACT IN AERS: 2.0000 | INHALATION_SPRAY | Freq: Four times a day (QID) | RESPIRATORY_TRACT | 1 refills | Status: DC | PRN

## 2021-01-25 MED ORDER — ATORVASTATIN CALCIUM 20 MG PO TABS: 20.0000 mg | ORAL_TABLET | Freq: Every day | ORAL | 1 refills | Status: DC

## 2021-01-25 MED ORDER — HYDROXYZINE HCL 25 MG PO TABS: 25.0000 mg | ORAL_TABLET | Freq: Three times a day (TID) | ORAL | 1 refills | Status: DC | PRN

## 2021-01-25 MED ORDER — AMITRIPTYLINE HCL 75 MG PO TABS
75.0000 mg | ORAL_TABLET | Freq: Every day | ORAL | 1 refills | Status: DC
Start: 2021-01-25 — End: 2021-10-04

## 2021-02-02 ENCOUNTER — Encounter: Payer: Self-pay | Admitting: Podiatry

## 2021-02-02 ENCOUNTER — Ambulatory Visit (HOSPITAL_COMMUNITY): Payer: Medicaid Other

## 2021-02-04 ENCOUNTER — Telehealth: Payer: Self-pay | Admitting: *Deleted

## 2021-02-04 NOTE — Telephone Encounter (Signed)
Patient is calling because her left foot 5th toe is painful for about 2 years but has become worse within the last 2 months,has been having other complications , has not been in. She has been scheduled for 02/09/21@8 :15,appointment has been confirmed.

## 2021-02-09 ENCOUNTER — Ambulatory Visit (INDEPENDENT_AMBULATORY_CARE_PROVIDER_SITE_OTHER): Payer: Medicaid Other | Admitting: Podiatry

## 2021-02-09 ENCOUNTER — Encounter: Payer: Self-pay | Admitting: Podiatry

## 2021-02-09 ENCOUNTER — Other Ambulatory Visit: Payer: Self-pay

## 2021-02-09 DIAGNOSIS — E1142 Type 2 diabetes mellitus with diabetic polyneuropathy: Secondary | ICD-10-CM

## 2021-02-09 DIAGNOSIS — M205X2 Other deformities of toe(s) (acquired), left foot: Secondary | ICD-10-CM

## 2021-02-09 DIAGNOSIS — M79674 Pain in right toe(s): Secondary | ICD-10-CM

## 2021-02-09 DIAGNOSIS — M79675 Pain in left toe(s): Secondary | ICD-10-CM | POA: Diagnosis not present

## 2021-02-09 DIAGNOSIS — B351 Tinea unguium: Secondary | ICD-10-CM

## 2021-02-09 DIAGNOSIS — Q828 Other specified congenital malformations of skin: Secondary | ICD-10-CM

## 2021-02-09 NOTE — Progress Notes (Signed)
ANNUAL DIABETIC FOOT EXAM  Subjective: Kendra Griffith presents today for for annual diabetic foot examination and painful porokeratotic lesion(s) bilateral and painful mycotic toenails that limit ambulation. Painful toenails interfere with ambulation. Aggravating factors include wearing enclosed shoe gear. Pain is relieved with periodic professional debridement. Painful porokeratotic lesions are aggravated when weightbearing with and without shoegear. Pain is relieved with periodic professional debridement..  Patient relates 6-7 year h/o diabetes.  Patient denies any h/o foot wounds.  Patient has been diagnosed with neuropathy and it is managed with gabapentin.  She has been using corn removers on her calluses and filing them down. Has not developed any wounds.  She states she does not apply it to her left 5th toe.  Patient does not monitor blood glucose daily. Last A1c was 6.8% in September, 2021.  [name], NP is patient's PCP. Last visit was 01/15/2021.  Past Medical History:  Diagnosis Date   ABSCESS, TOOTH 08/20/2009   Qualifier: Diagnosis of  By: Jorene Minors, Scott     Anemia    Anxiety    Bipolar disorder ([place])    Blood transfusion without reported diagnosis    childbirth   Chronic pain    feet and back   Clotting disorder ([place])    childbirth   Dental caries    lesion soft palate   Depression    Diabetes mellitus without complication ([place])    prediabetes   GERD (gastroesophageal reflux disease)    Hypertension    Injury    right side from jumping off bunkbed   Pre-diabetes    Sickle cell trait ([place])    Wears glasses    Patient Active Problem List   Diagnosis Date Noted   Post-operative state 70/26/3785   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   Tobacco use 06/09/2017   Breast cancer screening 01/21/2016   Chronic pain syndrome 09/25/2014   Type 2 diabetes mellitus with diabetic polyneuropathy, without long-term current use of insulin ([place])  06/30/2014   Peripheral neuropathy 05/09/2013   Poor dentition 02/01/2013   Pain in joint, ankle and foot 02/01/2013   Essential hypertension, benign 02/01/2013   FOOT PAIN, BILATERAL 06/16/2010   RECTAL BLEEDING 12/08/2009   HEMATURIA UNSPECIFIED 12/08/2009   DEPRESSION 08/20/2009   Mixed hyperlipidemia 07/30/2009   ANEMIA-NOS 07/30/2009   GERD 07/30/2009   CALLUS, FOOT 07/30/2009   Past Surgical History:  Procedure Laterality Date   BARTHOLIN CYST MARSUPIALIZATION N/A 07/16/2019   Procedure: EXCISION OF BARTHOLIN CYST;  Surgeon: Chancy Milroy, MD;  Location: [place];  Service: Gynecology;  Laterality: N/A;   CESAREAN SECTION     three   COLONOSCOPY     cyst removal 1997     FACIAL RECONSTRUCTION SURGERY     FOOT SURGERY Bilateral    LESION REMOVAL Left 10/15/2018   Procedure: Lesion Removal left soft palate;  Surgeon: Diona Browner, DDS;  Location: [place];  Service: Oral Surgery;  Laterality: Left;   TOOTH EXTRACTION Bilateral 10/15/2018   Procedure: DENTAL EXTRACTIONS OF TEETH NUMBER TWO, FOUR, FIVE, SIX, SEVEN, EIGHT, NINE, TEN, ELEVEN, TWELVE, THIRTEEN, FOURTEEN, SEVENTEEN, TWENTY-ONE, TWENTY-TWO, TWENTY-THREE, TWENTY-FOUR, TWENTY-FIVE, TWENTY-SIX, TWENTY-SEVEN, TWENTY-EIGHT WITH ALVEOLOPLASTY;  Surgeon: Diona Browner, DDS;  Location: [place];  Service: Oral Surgery;  Laterality: Bilateral;   TUBAL LIGATION     Current Outpatient Medications on File Prior to Visit  Medication Sig Dispense Refill   acetaminophen-codeine (TYLENOL #3) 300-30 MG tablet TAKE 1 TABLET BY MOUTH EVERY 8 HOURS  AS NEEDED FOR MODERATE PAIN OR  SEVERE  PAIN 30 tablet 0   albuterol (VENTOLIN HFA) 108 (90 Base) MCG/ACT inhaler Inhale 2 puffs into the lungs every 6 (six) hours as needed for wheezing or shortness of breath. 18 g 1   amitriptyline (ELAVIL) 75 MG tablet Take 1 tablet (75 mg total) by mouth at bedtime. 90 tablet 1   amLODipine (NORVASC) 5 MG tablet Take 1 tablet by mouth once daily  for blood pressure 90 tablet 0   atorvastatin (LIPITOR) 20 MG tablet Take 1 tablet (20 mg total) by mouth daily. 90 tablet 1   Biotin 5 MG CAPS Take 5 mg by mouth 3 (three) times daily.     chlorhexidine (HIBICLENS) 4 % external liquid Apply topically daily as needed. Use when bathing or showering 946 mL 0   cholecalciferol (VITAMIN D3) 25 MCG (1000 UT) tablet Take 1,000 Units by mouth daily.     ezetimibe (ZETIA) 10 MG tablet Take 1 tablet (10 mg total) by mouth daily. 90 tablet 3   famotidine (PEPCID) 20 MG tablet Take 1 tablet (20 mg total) by mouth 2 (two) times daily. 180 tablet 1   ferrous sulfate 324 MG TBEC Take 324 mg by mouth.     gabapentin (NEURONTIN) 300 MG capsule Take 1 capsule (300 mg total) by mouth 2 (two) times daily. 180 capsule 1   GLUTAMINE PO Take 500 mg by mouth.     hydrocortisone (ANUSOL-HC) 2.5 % rectal cream Place 1 application rectally at bedtime. 30 g 1   hydroquinone 4 % cream Apply topically 2 (two) times daily.     hydrOXYzine (ATARAX/VISTARIL) 25 MG tablet Take 1 tablet (25 mg total) by mouth 3 (three) times daily as needed. 90 tablet 1   ibuprofen (ADVIL) 800 MG tablet Take 1 tablet (800 mg total) by mouth every 8 (eight) hours as needed. 60 tablet 1   Multiple Vitamin (MULTI-VITAMIN) tablet Take 1 tablet by mouth daily.     NONFORMULARY OR COMPOUNDED ITEM Peripheral Neuropathy Cream: Bupivacaine 1%, Doxepin 3%, Gabapentin 6%, Pentoxifylline 3%, Topiramate 1% Order faxed to [facility] 1 each 3   Omega-3 Fatty Acids (OMEGA 3 500) 500 MG CAPS Take 500 mg by mouth daily.     polyethylene glycol powder (GLYCOLAX/MIRALAX) 17 GM/SCOOP powder Take 17 g by mouth 2 (two) times daily as needed. 3350 g 1   vitamin B-12 (CYANOCOBALAMIN) 500 MCG tablet Take 500 mcg by mouth daily.     vitamin C (ASCORBIC ACID) 500 MG tablet Take 500 mg by mouth daily.     Zinc 50 MG CAPS Take 1 capsule by mouth daily.     No current facility-administered medications on file  prior to visit.    Allergies  Allergen Reactions   Amoxicillin     Pain Did it involve swelling of the face/tongue/throat, SOB, or low BP? No Did it involve sudden or severe rash/hives, skin peeling, or any reaction on the inside of your mouth or nose? No Did you need to seek medical attention at a hospital or doctor's office? Yes When did it last happen?      5 years ago If all above answers are "NO", may proceed with cephalosporin use.    Darvocet [Propoxyphene N-Acetaminophen] Nausea And Vomiting    Makes her jittery   Hydrocodone-Acetaminophen Nausea And Vomiting    Upset stomach   Percocet [Oxycodone-Acetaminophen]     Makes her jittery   Valium [Diazepam] Other (See Comments)  Makes pt. Feel "out of wack"   Social History   Occupational History   Occupation: applying for disability  Tobacco Use   Smoking status: Every Day    Packs/day: 2.00    Years: 29.00    Pack years: 58.00    Types: Cigarettes   Smokeless tobacco: Never  Vaping Use   Vaping Use: Never used  Substance and Sexual Activity   Alcohol use: No    Alcohol/week: 0.0 standard drinks    Comment: Stopped drinking 2013   Drug use: No   Sexual activity: Not Currently    Birth control/protection: None   Family History  Problem Relation Age of Onset   Heart disease Mother    Asthma Sister    Diabetes Sister    COPD Sister    Diabetes Sister    Diabetes Sister    Breast cancer Neg Hx    Colon cancer Neg Hx    Esophageal cancer Neg Hx    Pancreatic cancer Neg Hx    Liver disease Neg Hx    Stomach cancer Neg Hx    Immunization History  Administered Date(s) Administered   Influenza,inj,Quad PF,6+ Mos 01/07/2014, 01/21/2016, 02/09/2017, 01/11/2018, 01/18/2019, 01/07/2020, 01/15/2021   Influenza,inj,quad, With Preservative 02/01/2013   Pneumococcal Polysaccharide-23 01/18/2019   Zoster Recombinat (Shingrix) 01/15/2021     Review of Systems: Negative except as noted in the HPI.    Objective: There were no vitals filed for this visit.  Kendra Griffith is a pleasant 55 y.o. female in NAD. AAO X 3.  Vascular Examination: CFT <3 seconds b/l LE. Palpable DP/PT pulses b/l LE. Digital hair absent b/l. Skin temperature gradient WNL b/l. No pain with calf compression b/l. No edema noted b/l. Lower extremity skin temperature gradient within normal limits.  Dermatological Examination: Pedal integument with normal turgor, texture and tone BLE. No open wounds b/l LE. No interdigital macerations noted b/l LE. Toenails 1-5 b/l elongated, discolored, dystrophic, thickened, crumbly with subungual debris and tenderness to dorsal palpation. Porokeratotic lesion(s) L 5th toe, submet head 1 right foot, submet head 2 left foot, and submet head 5 b/l. No erythema, no edema, no drainage, no fluctuance.  Musculoskeletal Examination: Normal muscle strength 5/5 to all lower extremity muscle groups bilaterally. Adductovarus deformity L 5th toe with painful lesion at lateral nailfold.  Footwear Assessment: Does the patient wear appropriate shoes? Yes. Does the patient need inserts/orthotics? Yes.  Neurological Examination: Protective sensation intact 5/5 intact bilaterally with 10g monofilament b/l. Vibratory sensation intact b/l. Proprioception intact bilaterally. Deep tendon reflexes normal b/l.   Hemoglobin A1C Latest Ref Rng & Units 10/26/2020  HGBA1C 5.7 - 6.4 % 6.4  Some recent data might be hidden   Assessment: 1. Pain due to onychomycosis of toenails of both feet   2. Porokeratosis   3. Adductovarus rotation of toe, acquired, left   4. Diabetic peripheral neuropathy associated with type 2 diabetes mellitus ([place])   5. Encounter for diabetic foot exam ([employer])     ADA Risk Categorization: Low Risk :  Patient has all of the following: Intact protective sensation No prior foot ulcer  No severe deformity Pedal pulses present  Plan: -Examined patient. -Counseled her on the  dangers of using OTC corn/callus removers. She related understanding. -Explored lesion left 5th digit and no sign of abscess/wound. Dressed lesion with betadine ointment and fabric band-aid. Applied coban in gentle derotational manner. Patient noted relief after application of shoe gear. Will refer her to Dr. Boneta Lucks for  consultation of adductovarus deformity left 5th toe with painful lateral lesion. She will schedule appt with him in January or February. -Diabetic foot examination performed today. -Toenails 1-5 b/l were debrided in length and girth with sterile nail nippers and dremel without iatrogenic bleeding.  -Patient/POA to call should there be question/concern in the interim.  No follow-ups on file.  Marzetta Board, DPM

## 2021-02-17 ENCOUNTER — Other Ambulatory Visit: Payer: Self-pay

## 2021-02-17 ENCOUNTER — Ambulatory Visit (HOSPITAL_COMMUNITY)
Admission: RE | Admit: 2021-02-17 | Discharge: 2021-02-17 | Disposition: A | Discharge status: Home / Self Care | Payer: Medicaid Other | Source: Ambulatory Visit | Attending: Nurse Practitioner | Admitting: Nurse Practitioner

## 2021-02-17 DIAGNOSIS — Z122 Encounter for screening for malignant neoplasm of respiratory organs: Secondary | ICD-10-CM | POA: Insufficient documentation

## 2021-03-03 ENCOUNTER — Other Ambulatory Visit: Payer: Self-pay | Admitting: Nurse Practitioner

## 2021-03-03 DIAGNOSIS — I10 Essential (primary) hypertension: Secondary | ICD-10-CM

## 2021-03-03 NOTE — Telephone Encounter (Signed)
Requested medication (s) are due for refill today - no  Requested medication (s) are on the active medication list -no  Future visit scheduled -yes  Last refill: 10/27/20 discontinued  Notes to clinic: Request RF: medication no longer on current medication list  Requested Prescriptions  Pending Prescriptions Disp Refills   hydrochlorothiazide (HYDRODIURIL) 25 MG tablet [Pharmacy Med Name: hydroCHLOROthiazide 25 MG Oral Tablet] 90 tablet 0    Sig: Take 1 tablet by mouth once daily     Cardiovascular: Diuretics - Thiazide Failed - 03/03/2021 10:14 AM      Failed - Last BP in normal range    BP Readings from Last 1 Encounters:  01/15/21 (!) 152/93          Passed - Ca in normal range and within 360 days    Calcium  Date Value Ref Range Status  10/26/2020 9.6 8.7 - 10.2 mg/dL Final   Calcium, Ion  Date Value Ref Range Status  10/27/2010 1.02 (L) 1.12 - 1.32 mmol/L Final          Passed - Cr in normal range and within 360 days    Creat  Date Value Ref Range Status  01/21/2016 0.83 0.50 - 1.05 mg/dL Final    Comment:      For patients > or = 55 years of age: The upper reference limit for Creatinine is approximately 13% higher for people identified as African-American.      Creatinine, Ser  Date Value Ref Range Status  10/26/2020 0.99 0.57 - 1.00 mg/dL Final   Creatinine,U  Date Value Ref Range Status  07/30/2009 73.1 mg/dL Final    Comment:    See lab report for associated comment(s)   Creatinine, Urine  Date Value Ref Range Status  01/21/2016 166 20 - 320 mg/dL Final          Passed - K in normal range and within 360 days    Potassium  Date Value Ref Range Status  10/26/2020 4.2 3.5 - 5.2 mmol/L Final          Passed - Na in normal range and within 360 days    Sodium  Date Value Ref Range Status  10/26/2020 140 134 - 144 mmol/L Final          Passed - Valid encounter within last 6 months    Recent Outpatient Visits           1 month ago  Encounter for Papanicolaou smear for cervical cancer screening   [place], Vernia Buff, NP   4 months ago Rectal abnormality   [place], [place] W, NP   9 months ago Type 2 diabetes mellitus with diabetic polyneuropathy, without long-term current use of insulin [facility])   [place], [place] W, NP   1 year ago Type 2 diabetes mellitus with diabetic polyneuropathy, without long-term current use of insulin [facility])   [place], Vernia Buff, NP   1 year ago Need for influenza vaccination   [place], RPH-CPP       Future Appointments             In 1 month [name], NP [place]               Requested Prescriptions  Pending Prescriptions Disp Refills  hydrochlorothiazide (HYDRODIURIL) 25 MG tablet [Pharmacy Med Name: hydroCHLOROthiazide 25 MG Oral Tablet] 90 tablet 0    Sig: Take 1 tablet by mouth once daily     Cardiovascular: Diuretics - Thiazide Failed - 03/03/2021 10:14 AM      Failed - Last BP in normal range    BP Readings from Last 1 Encounters:  01/15/21 (!) 152/93          Passed - Ca in normal range and within 360 days    Calcium  Date Value Ref Range Status  10/26/2020 9.6 8.7 - 10.2 mg/dL Final   Calcium, Ion  Date Value Ref Range Status  10/27/2010 1.02 (L) 1.12 - 1.32 mmol/L Final          Passed - Cr in normal range and within 360 days    Creat  Date Value Ref Range Status  01/21/2016 0.83 0.50 - 1.05 mg/dL Final    Comment:      For patients > or = 55 years of age: The upper reference limit for Creatinine is approximately 13% higher for people identified as African-American.      Creatinine, Ser  Date Value Ref Range Status  10/26/2020 0.99 0.57 - 1.00 mg/dL Final   Creatinine,U  Date Value Ref Range  Status  07/30/2009 73.1 mg/dL Final    Comment:    See lab report for associated comment(s)   Creatinine, Urine  Date Value Ref Range Status  01/21/2016 166 20 - 320 mg/dL Final          Passed - K in normal range and within 360 days    Potassium  Date Value Ref Range Status  10/26/2020 4.2 3.5 - 5.2 mmol/L Final          Passed - Na in normal range and within 360 days    Sodium  Date Value Ref Range Status  10/26/2020 140 134 - 144 mmol/L Final          Passed - Valid encounter within last 6 months    Recent Outpatient Visits           1 month ago Encounter for Papanicolaou smear for cervical cancer screening   [place], Vernia Buff, NP   4 months ago Rectal abnormality   [place], [place] W, NP   9 months ago Type 2 diabetes mellitus with diabetic polyneuropathy, without long-term current use of insulin [facility])   [place], [place] W, NP   1 year ago Type 2 diabetes mellitus with diabetic polyneuropathy, without long-term current use of insulin [facility])   [place], Vernia Buff, NP   1 year ago Need for influenza vaccination   [place], RPH-CPP       Future Appointments             In 1 month [name], NP [place]

## 2021-03-07 ENCOUNTER — Other Ambulatory Visit: Payer: Self-pay | Admitting: Nurse Practitioner

## 2021-03-07 DIAGNOSIS — I10 Essential (primary) hypertension: Secondary | ICD-10-CM

## 2021-03-08 ENCOUNTER — Other Ambulatory Visit: Payer: Self-pay | Admitting: Nurse Practitioner

## 2021-03-08 ENCOUNTER — Telehealth: Payer: Self-pay | Admitting: Nurse Practitioner

## 2021-03-08 MED ORDER — NICOTINE 21 MG/24HR TD PT24: 21.0000 mg | MEDICATED_PATCH | Freq: Every day | TRANSDERMAL | 0 refills | Status: AC

## 2021-03-08 MED ORDER — NICOTINE POLACRILEX 2 MG MT GUM: 2.0000 mg | CHEWING_GUM | OROMUCOSAL | 0 refills | Status: DC | PRN

## 2021-03-08 NOTE — Telephone Encounter (Signed)
Pt is calling to receive results for CT CHEST LUNG CA SCREEN LOW DOSE W/O CM (Accession [id number]) (Order [id number]) Please advise CB- [phone]

## 2021-03-08 NOTE — Telephone Encounter (Signed)
Requested medications are due for refill today NO  Requested medications are on the active medication list NO  Last refill 10/27/20  Last visit 01/15/21  Future visit scheduled 04/16/21  Notes to clinic I am sorry, this med is not on current med list, accidentally sent note about inconsistent dose. Requested Prescriptions  Pending Prescriptions Disp Refills   hydrochlorothiazide (HYDRODIURIL) 25 MG tablet [Pharmacy Med Name: hydroCHLOROthiazide 25 MG Oral Tablet] 90 tablet 0    Sig: Take 1 tablet by mouth once daily     Cardiovascular: Diuretics - Thiazide Failed - 03/07/2021  2:12 PM      Failed - Last BP in normal range    BP Readings from Last 1 Encounters:  01/15/21 (!) 152/93          Passed - Ca in normal range and within 360 days    Calcium  Date Value Ref Range Status  10/26/2020 9.6 8.7 - 10.2 mg/dL Final   Calcium, Ion  Date Value Ref Range Status  10/27/2010 1.02 (L) 1.12 - 1.32 mmol/L Final          Passed - Cr in normal range and within 360 days    Creat  Date Value Ref Range Status  01/21/2016 0.83 0.50 - 1.05 mg/dL Final    Comment:      For patients > or = 55 years of age: The upper reference limit for Creatinine is approximately 13% higher for people identified as African-American.      Creatinine, Ser  Date Value Ref Range Status  10/26/2020 0.99 0.57 - 1.00 mg/dL Final   Creatinine,U  Date Value Ref Range Status  07/30/2009 73.1 mg/dL Final    Comment:    See lab report for associated comment(s)   Creatinine, Urine  Date Value Ref Range Status  01/21/2016 166 20 - 320 mg/dL Final          Passed - K in normal range and within 360 days    Potassium  Date Value Ref Range Status  10/26/2020 4.2 3.5 - 5.2 mmol/L Final          Passed - Na in normal range and within 360 days    Sodium  Date Value Ref Range Status  10/26/2020 140 134 - 144 mmol/L Final          Passed - Valid encounter within last 6 months    Recent Outpatient  Visits           1 month ago Encounter for Papanicolaou smear for cervical cancer screening   [place], Vernia Buff, NP   4 months ago Rectal abnormality   [place], [place] W, NP   9 months ago Type 2 diabetes mellitus with diabetic polyneuropathy, without long-term current use of insulin [facility])   [place], [place] W, NP   1 year ago Type 2 diabetes mellitus with diabetic polyneuropathy, without long-term current use of insulin [facility])   [place], Vernia Buff, NP   1 year ago Need for influenza vaccination   [place], RPH-CPP       Future Appointments             In 1 month [name], NP [place]

## 2021-03-08 NOTE — Telephone Encounter (Signed)
Requested medications are due for refill today NO  Requested medications are on the active medication list NO,  Dose inconsistent.   Last refill 10/27/20  Last visit 01/15/21  Future visit scheduled 04/16/21  Notes to clinic  Dose inconsistent with current med list, please assess. Requested Prescriptions  Pending Prescriptions Disp Refills   hydrochlorothiazide (HYDRODIURIL) 25 MG tablet [Pharmacy Med Name: hydroCHLOROthiazide 25 MG Oral Tablet] 90 tablet 0    Sig: Take 1 tablet by mouth once daily     Cardiovascular: Diuretics - Thiazide Failed - 03/07/2021  2:12 PM      Failed - Last BP in normal range    BP Readings from Last 1 Encounters:  01/15/21 (!) 152/93          Passed - Ca in normal range and within 360 days    Calcium  Date Value Ref Range Status  10/26/2020 9.6 8.7 - 10.2 mg/dL Final   Calcium, Ion  Date Value Ref Range Status  10/27/2010 1.02 (L) 1.12 - 1.32 mmol/L Final          Passed - Cr in normal range and within 360 days    Creat  Date Value Ref Range Status  01/21/2016 0.83 0.50 - 1.05 mg/dL Final    Comment:      For patients > or = 55 years of age: The upper reference limit for Creatinine is approximately 13% higher for people identified as African-American.      Creatinine, Ser  Date Value Ref Range Status  10/26/2020 0.99 0.57 - 1.00 mg/dL Final   Creatinine,U  Date Value Ref Range Status  07/30/2009 73.1 mg/dL Final    Comment:    See lab report for associated comment(s)   Creatinine, Urine  Date Value Ref Range Status  01/21/2016 166 20 - 320 mg/dL Final          Passed - K in normal range and within 360 days    Potassium  Date Value Ref Range Status  10/26/2020 4.2 3.5 - 5.2 mmol/L Final          Passed - Na in normal range and within 360 days    Sodium  Date Value Ref Range Status  10/26/2020 140 134 - 144 mmol/L Final          Passed - Valid encounter within last 6 months    Recent Outpatient Visits            1 month ago Encounter for Papanicolaou smear for cervical cancer screening   [place], Vernia Buff, NP   4 months ago Rectal abnormality   [place], [place] W, NP   9 months ago Type 2 diabetes mellitus with diabetic polyneuropathy, without long-term current use of insulin [facility])   [place], [place] W, NP   1 year ago Type 2 diabetes mellitus with diabetic polyneuropathy, without long-term current use of insulin [facility])   [place], Vernia Buff, NP   1 year ago Need for influenza vaccination   [place], RPH-CPP       Future Appointments             In 1 month [name], NP [place]

## 2021-03-08 NOTE — Telephone Encounter (Signed)
Left message on voicemail to return call.

## 2021-03-08 NOTE — Telephone Encounter (Signed)
Patient aware of results.   She is requesting nicotine patches and gum to be sent to her pharmacy.

## 2021-03-12 ENCOUNTER — Other Ambulatory Visit: Payer: Self-pay | Admitting: Nurse Practitioner

## 2021-03-12 DIAGNOSIS — F419 Anxiety disorder, unspecified: Secondary | ICD-10-CM

## 2021-03-12 DIAGNOSIS — G894 Chronic pain syndrome: Secondary | ICD-10-CM

## 2021-03-12 DIAGNOSIS — F32A Depression, unspecified: Secondary | ICD-10-CM

## 2021-03-12 NOTE — Telephone Encounter (Signed)
Copied from [place] [id number]. Topic: Quick Communication - Rx Refill/Question >> Mar 12, 2021 12:26 PM Lenon Curt, Rana Snare A wrote: Medication: hydrochlorothiazide (HYDRODIURIL) 25 MG tablet [[id number]]   gabapentin (NEURONTIN) 300 MG capsule [[id number]]    Has the patient contacted their pharmacy? Yes.  The patient has been directed to contact their PCP. The patient would like to request 60 day quantities of both medications  (Agent: If no, request that the patient contact the pharmacy for the refill. If patient does not wish to contact the pharmacy document the reason why and proceed with request.) (Agent: If yes, when and what did the pharmacy advise?)  Preferred Pharmacy (with phone number or street name): [place] ([place]), [place] - [address]  Phone:  [phone] Fax:  [phone]  Has the patient been seen for an appointment in the last year OR does the patient have an upcoming appointment? Yes.    Agent: Please be advised that RX refills may take up to 3 business days. We ask that you follow-up with your pharmacy.

## 2021-03-15 ENCOUNTER — Other Ambulatory Visit: Payer: Self-pay | Admitting: Nurse Practitioner

## 2021-03-15 DIAGNOSIS — I10 Essential (primary) hypertension: Secondary | ICD-10-CM

## 2021-03-15 MED ORDER — GABAPENTIN 300 MG PO CAPS: 300.0000 mg | ORAL_CAPSULE | Freq: Two times a day (BID) | ORAL | 1 refills | Status: DC

## 2021-03-15 NOTE — Telephone Encounter (Signed)
Pt is calling to check on the status of the hydrochlorothiazide (HYDRODIURIL) 25 MG tablet [[id number]]  refill request

## 2021-04-16 ENCOUNTER — Ambulatory Visit: Payer: Medicaid Other | Admitting: Nurse Practitioner

## 2021-05-05 ENCOUNTER — Ambulatory Visit: Payer: Medicaid Other | Admitting: Podiatry

## 2021-05-07 DIAGNOSIS — F314 Bipolar disorder, current episode depressed, severe, without psychotic features: Secondary | ICD-10-CM | POA: Diagnosis not present

## 2021-05-12 ENCOUNTER — Other Ambulatory Visit: Payer: Self-pay

## 2021-05-12 ENCOUNTER — Other Ambulatory Visit: Payer: Self-pay | Admitting: Podiatry

## 2021-05-12 ENCOUNTER — Ambulatory Visit (INDEPENDENT_AMBULATORY_CARE_PROVIDER_SITE_OTHER): Payer: Medicaid Other

## 2021-05-12 ENCOUNTER — Ambulatory Visit: Payer: Medicaid Other | Admitting: Podiatry

## 2021-05-12 DIAGNOSIS — Q828 Other specified congenital malformations of skin: Secondary | ICD-10-CM

## 2021-05-12 DIAGNOSIS — M216X2 Other acquired deformities of left foot: Secondary | ICD-10-CM

## 2021-05-12 DIAGNOSIS — M216X1 Other acquired deformities of right foot: Secondary | ICD-10-CM

## 2021-05-12 DIAGNOSIS — Z01818 Encounter for other preprocedural examination: Secondary | ICD-10-CM | POA: Diagnosis not present

## 2021-05-12 NOTE — Progress Notes (Signed)
Subjective:  Patient ID: Kendra Griffith, female    DOB: 03-Jun-1965,  MRN: [id number]  Chief Complaint  Patient presents with   Callouses    Painful callus     56 y.o. female presents with the above complaint.  Patient presents with bilateral fifth metatarsal plantarflexed metatarsal with underlying lesion that is painful.  She states has been painful for a long time is progressing and worse.  She was conservatively being treated by Dr. Adah Perl and has progressive gotten worse.  She wanted to discuss surgical options.  She has tried shoe gear modification offloading none of which has helped.  She is a diabetic with last A1c of 6.4% that was done few months back.  She states that her sugars are pretty well controlled.  She wanted to get it evaluated for surgical consultation.  She denies any other acute complaints   Review of Systems: Negative except as noted in the HPI. Denies N/V/F/Ch.  Past Medical History:  Diagnosis Date   ABSCESS, TOOTH 08/20/2009   Qualifier: Diagnosis of  By: Jorene Minors, Scott     Anemia    Anxiety    Bipolar disorder ([place])    Blood transfusion without reported diagnosis    childbirth   Chronic pain    feet and back   Clotting disorder ([place])    childbirth   Dental caries    lesion soft palate   Depression    Diabetes mellitus without complication ([place])    prediabetes   GERD (gastroesophageal reflux disease)    Hypertension    Injury    right side from jumping off bunkbed   Pre-diabetes    Sickle cell trait (H[facility])    Wears glasses     Current Outpatient Medications:    acetaminophen-codeine (TYLENOL #3) 300-30 MG tablet, TAKE 1 TABLET BY MOUTH EVERY 8 HOURS AS NEEDED FOR MODERATE PAIN OR  SEVERE  PAIN, Disp: 30 tablet, Rfl: 0   albuterol (VENTOLIN HFA) 108 (90 Base) MCG/ACT inhaler, Inhale 2 puffs into the lungs every 6 (six) hours as needed for wheezing or shortness of breath., Disp: 18 g, Rfl: 1   amitriptyline (ELAVIL) 75 MG tablet, Take 1 tablet  (75 mg total) by mouth at bedtime., Disp: 90 tablet, Rfl: 1   amLODipine (NORVASC) 5 MG tablet, Take 1 tablet by mouth once daily for blood pressure, Disp: 90 tablet, Rfl: 0   atorvastatin (LIPITOR) 20 MG tablet, Take 1 tablet (20 mg total) by mouth daily., Disp: 90 tablet, Rfl: 1   Biotin 5 MG CAPS, Take 5 mg by mouth 3 (three) times daily., Disp: , Rfl:    chlorhexidine (HIBICLENS) 4 % external liquid, Apply topically daily as needed. Use when bathing or showering, Disp: 946 mL, Rfl: 0   cholecalciferol (VITAMIN D3) 25 MCG (1000 UT) tablet, Take 1,000 Units by mouth daily., Disp: , Rfl:    ezetimibe (ZETIA) 10 MG tablet, Take 1 tablet (10 mg total) by mouth daily., Disp: 90 tablet, Rfl: 3   famotidine (PEPCID) 20 MG tablet, Take 1 tablet (20 mg total) by mouth 2 (two) times daily., Disp: 180 tablet, Rfl: 1   ferrous sulfate 324 MG TBEC, Take 324 mg by mouth., Disp: , Rfl:    gabapentin (NEURONTIN) 300 MG capsule, Take 1 capsule (300 mg total) by mouth 2 (two) times daily., Disp: 180 capsule, Rfl: 1   GLUTAMINE PO, Take 500 mg by mouth., Disp: , Rfl:    hydrocortisone (ANUSOL-HC) 2.5 % rectal cream,  Place 1 application rectally at bedtime., Disp: 30 g, Rfl: 1   hydroquinone 4 % cream, Apply topically 2 (two) times daily., Disp: , Rfl:    hydrOXYzine (ATARAX/VISTARIL) 25 MG tablet, Take 1 tablet (25 mg total) by mouth 3 (three) times daily as needed., Disp: 90 tablet, Rfl: 1   ibuprofen (ADVIL) 800 MG tablet, Take 1 tablet (800 mg total) by mouth every 8 (eight) hours as needed., Disp: 60 tablet, Rfl: 1   Multiple Vitamin (MULTI-VITAMIN) tablet, Take 1 tablet by mouth daily., Disp: , Rfl:    nicotine polacrilex (NICORETTE) 2 MG gum, Take 1 each (2 mg total) by mouth as needed for smoking cessation., Disp: 100 tablet, Rfl: 0   NONFORMULARY OR COMPOUNDED ITEM, Peripheral Neuropathy Cream: Bupivacaine 1%, Doxepin 3%, Gabapentin 6%, Pentoxifylline 3%, Topiramate 1% Order faxed to [facility],  Disp: 1 each, Rfl: 3   Omega-3 Fatty Acids (OMEGA 3 500) 500 MG CAPS, Take 500 mg by mouth daily., Disp: , Rfl:    polyethylene glycol powder (GLYCOLAX/MIRALAX) 17 GM/SCOOP powder, Take 17 g by mouth 2 (two) times daily as needed., Disp: 3350 g, Rfl: 1   vitamin B-12 (CYANOCOBALAMIN) 500 MCG tablet, Take 500 mcg by mouth daily., Disp: , Rfl:    vitamin C (ASCORBIC ACID) 500 MG tablet, Take 500 mg by mouth daily., Disp: , Rfl:    Zinc 50 MG CAPS, Take 1 capsule by mouth daily., Disp: , Rfl:   Social History   Tobacco Use  Smoking Status Every Day   Packs/day: 2.00   Years: 29.00   Pack years: 58.00   Types: Cigarettes  Smokeless Tobacco Never    Allergies  Allergen Reactions   Amoxicillin     Pain Did it involve swelling of the face/tongue/throat, SOB, or low BP? No Did it involve sudden or severe rash/hives, skin peeling, or any reaction on the inside of your mouth or nose? No Did you need to seek medical attention at a hospital or doctor's office? Yes When did it last happen?      5 years ago If all above answers are NO, may proceed with cephalosporin use.    Darvocet [Propoxyphene N-Acetaminophen] Nausea And Vomiting    Makes her jittery   Hydrocodone-Acetaminophen Nausea And Vomiting    Upset stomach   Percocet [Oxycodone-Acetaminophen]     Makes her jittery   Valium [Diazepam] Other (See Comments)    Makes pt. Feel "out of wack"   Objective:  There were no vitals filed for this visit. There is no height or weight on file to calculate BMI. Constitutional Well developed. Well nourished.  Vascular Dorsalis pedis pulses palpable bilaterally. Posterior tibial pulses palpable bilaterally. Capillary refill normal to all digits.  No cyanosis or clubbing noted. Pedal hair growth normal.  Neurologic Normal speech. Oriented to person, place, and time. Epicritic sensation to light touch grossly present bilaterally.  Dermatologic Nails well groomed and normal in  appearance. No open wounds. No skin lesions.  Orthopedic: Bilateral plantarflexed fifth metatarsal with underlying porokeratotic lesion.  Pain on palpation to the lesion.  No pain with range of motion of the fifth metatarsophalangeal joint bilaterally.   Radiographs: 3 views of skeletally mature no bilateral foot: Previous hardware noted to both sides.  Plantarflexed fifth metatarsal noted leading to excessive pressure on the outer part of the foot.  Hardware is intact no signs of backing out or loosening noted. Assessment:   1. Porokeratosis   2. Encounter for preoperative examination for  general surgical procedure   3. Plantar flexed metatarsal bone of right foot   4. Plantar flexed metatarsal, left    Plan:  Patient was evaluated and treated and all questions answered.  Bilateral fifth metatarsal plantarflexed metatarsal with underlying porokeratosis -All questions and concerns were discussed with the patient in extensive detail -I discussed with the patient given that there was previous surgery done in previous hardware that is present there is a risk of hardware complication as well as wound complications that are present.  She is also diabetic with no recent A1c.  I discussed with her that there is one complication that are present.  She would like to proceed despite the risks.  I have ordered a recent A1c as long as it is below 8% we will plan on proceeding with surgery.  I discussed with her that I plan to make him osteotomy cut behind the hardware and therefore should not require removal however I discussed with her that if there is a chance that they are in the way I will plan on removing it.  Patient states understanding would like to proceed with that plan -I will plan on doing bilateral floating osteotomy without removal of hardware.  I discussed my preoperative intraoperative postoperative plan in extensive detail she states understanding like to proceed with surgery -She will be  weightbearing as tolerated to both forefoot -Informed surgical risk consent was reviewed and read aloud to the patient.  I reviewed the films.  I have discussed my findings with the patient in great detail.  I have discussed all risks including but not limited to infection, stiffness, scarring, limp, disability, deformity, damage to blood vessels and nerves, numbness, poor healing, need for braces, arthritis, chronic pain, amputation, death.  All benefits and realistic expectations discussed in great detail.  I have made no promises as to the outcome.  I have provided realistic expectations.  I have offered the patient a 2nd opinion, which they have declined and assured me they preferred to proceed despite the risks   No follow-ups on file.

## 2021-05-13 LAB — HGB A1C W/O EAG: Hgb A1c MFr Bld: 6.6 % — ABNORMAL HIGH (ref 4.8–5.6)

## 2021-05-24 ENCOUNTER — Other Ambulatory Visit: Payer: Self-pay | Admitting: Nurse Practitioner

## 2021-05-24 DIAGNOSIS — L810 Postinflammatory hyperpigmentation: Secondary | ICD-10-CM | POA: Diagnosis not present

## 2021-05-24 DIAGNOSIS — L729 Follicular cyst of the skin and subcutaneous tissue, unspecified: Secondary | ICD-10-CM | POA: Diagnosis not present

## 2021-05-24 DIAGNOSIS — L700 Acne vulgaris: Secondary | ICD-10-CM | POA: Diagnosis not present

## 2021-05-24 DIAGNOSIS — G894 Chronic pain syndrome: Secondary | ICD-10-CM

## 2021-05-24 DIAGNOSIS — F419 Anxiety disorder, unspecified: Secondary | ICD-10-CM

## 2021-05-24 NOTE — Telephone Encounter (Signed)
Medication Refill - Medication:  gabapentin (NEURONTIN) 300 MG capsule  hydrOXYzine (ATARAX/VISTARIL) 25 MG tablet   Has the patient contacted their pharmacy? Yes.   Contact PCP  Preferred Pharmacy (with phone number or street name):  [facility] ([place]), [place] - [address]  [address], [place] [address]  Phone:  [phone]  Fax:  [phone]   Has the patient been seen for an appointment in the last year OR does the patient have an upcoming appointment? Yes.    Agent: Please be advised that RX refills may take up to 3 business days. We ask that you follow-up with your pharmacy.

## 2021-05-25 MED ORDER — HYDROXYZINE HCL 25 MG PO TABS: 25.0000 mg | ORAL_TABLET | Freq: Three times a day (TID) | ORAL | 1 refills | Status: DC | PRN

## 2021-05-25 NOTE — Telephone Encounter (Signed)
Requested Prescriptions  Pending Prescriptions Disp Refills   hydrOXYzine (ATARAX) 25 MG tablet 90 tablet 1    Sig: Take 1 tablet (25 mg total) by mouth 3 (three) times daily as needed.     Ear, Nose, and Throat:  Antihistamines 2 Passed - 05/24/2021  3:59 PM      Passed - Cr in normal range and within 360 days    Creat  Date Value Ref Range Status  01/21/2016 0.83 0.50 - 1.05 mg/dL Final    Comment:      For patients > or = 56 years of age: The upper reference limit for Creatinine is approximately 13% higher for people identified as African-American.      Creatinine, Ser  Date Value Ref Range Status  10/26/2020 0.99 0.57 - 1.00 mg/dL Final   Creatinine,U  Date Value Ref Range Status  07/30/2009 73.1 mg/dL Final    Comment:    See lab report for associated comment(s)   Creatinine, Urine  Date Value Ref Range Status  01/21/2016 166 20 - 320 mg/dL Final         Passed - Valid encounter within last 12 months    Recent Outpatient Visits          4 months ago Encounter for Papanicolaou smear for cervical cancer screening   [place], Vernia Buff, NP   7 months ago Rectal abnormality   [place], [place] W, NP   12 months ago Type 2 diabetes mellitus with diabetic polyneuropathy, without long-term current use of insulin [facility])   [place], [place] W, NP   1 year ago Type 2 diabetes mellitus with diabetic polyneuropathy, without long-term current use of insulin ([place])   [place], Vernia Buff, NP   1 year ago Need for influenza vaccination   [place], RPH-CPP      Future Appointments            In 2 weeks [name], NP [place]           Refused Prescriptions Disp Refills   gabapentin (NEURONTIN) 300 MG capsule 180 capsule 1     Sig: Take 1 capsule (300 mg total) by mouth 2 (two) times daily.     Neurology: Anticonvulsants - gabapentin Passed - 05/24/2021  3:59 PM      Passed - Cr in normal range and within 360 days    Creat  Date Value Ref Range Status  01/21/2016 0.83 0.50 - 1.05 mg/dL Final    Comment:      For patients > or = 56 years of age: The upper reference limit for Creatinine is approximately 13% higher for people identified as African-American.      Creatinine, Ser  Date Value Ref Range Status  10/26/2020 0.99 0.57 - 1.00 mg/dL Final   Creatinine,U  Date Value Ref Range Status  07/30/2009 73.1 mg/dL Final    Comment:    See lab report for associated comment(s)   Creatinine, Urine  Date Value Ref Range Status  01/21/2016 166 20 - 320 mg/dL Final         Passed - Completed PHQ-2 or PHQ-9 in the last 360 days      Passed - Valid encounter within last 12 months    Recent Outpatient Visits  4 months ago Encounter for Papanicolaou smear for cervical cancer screening   [place], Vernia Buff, NP   7 months ago Rectal abnormality   [place], [place] W, NP   12 months ago Type 2 diabetes mellitus with diabetic polyneuropathy, without long-term current use of insulin [facility])   [place], [place] W, NP   1 year ago Type 2 diabetes mellitus with diabetic polyneuropathy, without long-term current use of insulin [facility])   [place], Vernia Buff, NP   1 year ago Need for influenza vaccination   [place], RPH-CPP      Future Appointments            In 2 weeks [name], NP [place]

## 2021-06-10 ENCOUNTER — Telehealth: Payer: Self-pay

## 2021-06-10 NOTE — Telephone Encounter (Signed)
DOS 06/28/2021 ? ?METATARSAL OSTEOTOMY 5TH B/L ? ?[employer] MEDICAID EFFECTIVE DATE 06/09/2021 ? ?AUTH GOOD FROM 06/28/2021 - 09/26/2021 ? ?NOTIFICATION/PRIOR AUTHORIZATION NUMBER CASE STATUS CASE STATUS REASON PRIMARY CARE PHYSICIAN ?[id number] Closed Case Was Managed And Is Now Complete Dorna Mai ?ADVANCE NOTIFY DATE/TIME ADMISSION NOTIFY DATE/TIME ?06/10/2021 09:28 AM CST - ?COVERAGE STATUS ?OVERALL COVERAGE STATUS ?Covered/Approved ?1-2 CODE DESCRIPTION COVERAGE ?STATUS DECISION DATE ?[facility] ?Coverage determination is reflected for the facility admission and is not a guarantee of payment for ongoing services. ?Covered/Approved 06/10/2021 1 [id number] Osteotomy, with or without lengthening, more Covered/Approved 06/10/2021 2 [id number] Osteotomy, with or without lengthening, more Covered/Approved 06/10/2021 ?

## 2021-06-14 ENCOUNTER — Encounter: Payer: Self-pay | Admitting: Podiatry

## 2021-06-14 ENCOUNTER — Ambulatory Visit: Payer: Medicaid Other | Admitting: Nurse Practitioner

## 2021-06-15 ENCOUNTER — Telehealth: Payer: Self-pay | Admitting: Nurse Practitioner

## 2021-06-15 ENCOUNTER — Other Ambulatory Visit: Payer: Self-pay

## 2021-06-15 ENCOUNTER — Ambulatory Visit: Payer: Medicaid Other | Attending: Nurse Practitioner | Admitting: Nurse Practitioner

## 2021-06-15 NOTE — Telephone Encounter (Signed)
NO answer LVM 

## 2021-06-15 NOTE — Telephone Encounter (Signed)
No answer. LVM ?

## 2021-06-28 ENCOUNTER — Other Ambulatory Visit: Payer: Self-pay | Admitting: Podiatry

## 2021-06-28 DIAGNOSIS — M21542 Acquired clubfoot, left foot: Secondary | ICD-10-CM | POA: Diagnosis not present

## 2021-06-28 DIAGNOSIS — M205X2 Other deformities of toe(s) (acquired), left foot: Secondary | ICD-10-CM | POA: Diagnosis not present

## 2021-06-28 MED ORDER — ACETAMINOPHEN-CODEINE #3 300-30 MG PO TABS
1.0000 | ORAL_TABLET | ORAL | 0 refills | Status: DC | PRN
Start: 2021-06-28 — End: 2021-10-04

## 2021-07-07 ENCOUNTER — Ambulatory Visit (INDEPENDENT_AMBULATORY_CARE_PROVIDER_SITE_OTHER): Payer: Medicaid Other

## 2021-07-07 ENCOUNTER — Ambulatory Visit (INDEPENDENT_AMBULATORY_CARE_PROVIDER_SITE_OTHER): Payer: Medicaid Other | Admitting: Podiatry

## 2021-07-07 ENCOUNTER — Other Ambulatory Visit: Payer: Self-pay

## 2021-07-07 DIAGNOSIS — Q828 Other specified congenital malformations of skin: Secondary | ICD-10-CM

## 2021-07-07 DIAGNOSIS — M216X2 Other acquired deformities of left foot: Secondary | ICD-10-CM

## 2021-07-07 DIAGNOSIS — Z9889 Other specified postprocedural states: Secondary | ICD-10-CM

## 2021-07-07 NOTE — Progress Notes (Signed)
?Subjective:  ?Patient ID: Kendra Griffith, female    DOB: 06/17/1965,  MRN: [id number] ? ?Chief Complaint  ?Patient presents with  ? Routine Post Op  ?  POV #1 DOS 06/28/2021 LT 5TH METATARSAL FLOATING OSTEOTOMY  ? ? ?DOS: 06/28/21 ?Procedure: Left fifth metatarsal floating osteotomy ? ?56 y.o. female returns for post-op check.  Patient states she is doing well.  No acute complaints.  No pain.  Bandages clean dry and intact. ? ?Review of Systems: Negative except as noted in the HPI. Denies N/V/F/Ch. ? ?Past Medical History:  ?Diagnosis Date  ? ABSCESS, TOOTH 08/20/2009  ? Qualifier: Diagnosis of  By: Jorene Minors, Scott    ? Anemia   ? Anxiety   ? Bipolar disorder ([place])   ? Blood transfusion without reported diagnosis   ? childbirth  ? Chronic pain   ? feet and back  ? Clotting disorder ([place])   ? childbirth  ? Dental caries   ? lesion soft palate  ? Depression   ? Diabetes mellitus without complication ([place])   ? prediabetes  ? GERD (gastroesophageal reflux disease)   ? Hypertension   ? Injury   ? right side from jumping off bunkbed  ? Pre-diabetes   ? Sickle cell trait ([place])   ? Wears glasses   ? ? ?Current Outpatient Medications:  ?  acetaminophen-codeine (TYLENOL #3) 300-30 MG tablet, TAKE 1 TABLET BY MOUTH EVERY 8 HOURS AS NEEDED FOR MODERATE PAIN OR  SEVERE  PAIN, Disp: 30 tablet, Rfl: 0 ?  acetaminophen-codeine (TYLENOL #3) 300-30 MG tablet, Take 1-2 tablets by mouth every 4 (four) hours as needed for moderate pain., Disp: 30 tablet, Rfl: 0 ?  albuterol (VENTOLIN HFA) 108 (90 Base) MCG/ACT inhaler, Inhale 2 puffs into the lungs every 6 (six) hours as needed for wheezing or shortness of breath., Disp: 18 g, Rfl: 1 ?  amitriptyline (ELAVIL) 75 MG tablet, Take 1 tablet (75 mg total) by mouth at bedtime., Disp: 90 tablet, Rfl: 1 ?  amLODipine (NORVASC) 5 MG tablet, Take 1 tablet by mouth once daily for blood pressure, Disp: 90 tablet, Rfl: 0 ?  atorvastatin (LIPITOR) 20 MG tablet, Take 1 tablet (20 mg total) by mouth  daily., Disp: 90 tablet, Rfl: 1 ?  Biotin 5 MG CAPS, Take 5 mg by mouth 3 (three) times daily., Disp: , Rfl:  ?  chlorhexidine (HIBICLENS) 4 % external liquid, Apply topically daily as needed. Use when bathing or showering, Disp: 946 mL, Rfl: 0 ?  cholecalciferol (VITAMIN D3) 25 MCG (1000 UT) tablet, Take 1,000 Units by mouth daily., Disp: , Rfl:  ?  ezetimibe (ZETIA) 10 MG tablet, Take 1 tablet (10 mg total) by mouth daily., Disp: 90 tablet, Rfl: 3 ?  famotidine (PEPCID) 20 MG tablet, Take 1 tablet (20 mg total) by mouth 2 (two) times daily., Disp: 180 tablet, Rfl: 1 ?  ferrous sulfate 324 MG TBEC, Take 324 mg by mouth., Disp: , Rfl:  ?  gabapentin (NEURONTIN) 300 MG capsule, Take 1 capsule (300 mg total) by mouth 2 (two) times daily., Disp: 180 capsule, Rfl: 1 ?  GLUTAMINE PO, Take 500 mg by mouth., Disp: , Rfl:  ?  hydrocortisone (ANUSOL-HC) 2.5 % rectal cream, Place 1 application rectally at bedtime., Disp: 30 g, Rfl: 1 ?  hydroquinone 4 % cream, Apply topically 2 (two) times daily., Disp: , Rfl:  ?  hydrOXYzine (ATARAX) 25 MG tablet, Take 1 tablet (25 mg total) by mouth 3 (three)  times daily as needed., Disp: 90 tablet, Rfl: 1 ?  ibuprofen (ADVIL) 800 MG tablet, Take 1 tablet (800 mg total) by mouth every 8 (eight) hours as needed., Disp: 60 tablet, Rfl: 1 ?  Multiple Vitamin (MULTI-VITAMIN) tablet, Take 1 tablet by mouth daily., Disp: , Rfl:  ?  nicotine polacrilex (NICORETTE) 2 MG gum, Take 1 each (2 mg total) by mouth as needed for smoking cessation., Disp: 100 tablet, Rfl: 0 ?  NONFORMULARY OR COMPOUNDED ITEM, Peripheral Neuropathy Cream: Bupivacaine 1%, Doxepin 3%, Gabapentin 6%, Pentoxifylline 3%, Topiramate 1% Order faxed to [place]: 1 each, Rfl: 3 ?  Omega-3 Fatty Acids (OMEGA 3 500) 500 MG CAPS, Take 500 mg by mouth daily., Disp: , Rfl:  ?  polyethylene glycol powder (GLYCOLAX/MIRALAX) 17 GM/SCOOP powder, Take 17 g by mouth 2 (two) times daily as needed., Disp: 3350 g, Rfl: 1 ?   vitamin B-12 (CYANOCOBALAMIN) 500 MCG tablet, Take 500 mcg by mouth daily., Disp: , Rfl:  ?  vitamin C (ASCORBIC ACID) 500 MG tablet, Take 500 mg by mouth daily., Disp: , Rfl:  ?  Zinc 50 MG CAPS, Take 1 capsule by mouth daily., Disp: , Rfl:  ? ?Social History  ? ?Tobacco Use  ?Smoking Status Every Day  ? Packs/day: 2.00  ? Years: 29.00  ? Pack years: 58.00  ? Types: Cigarettes  ?Smokeless Tobacco Never  ? ? ?Allergies  ?Allergen Reactions  ? Amoxicillin   ?  Pain ?Did it involve swelling of the face/tongue/throat, SOB, or low BP? No ?Did it involve sudden or severe rash/hives, skin peeling, or any reaction on the inside of your mouth or nose? No ?Did you need to seek medical attention at a hospital or doctor's office? Yes ?When did it last happen?      5 years ago ?If all above answers are ?NO?, may proceed with cephalosporin use. ?  ? Darvocet [Propoxyphene N-Acetaminophen] Nausea And Vomiting  ?  Makes her jittery  ? Hydrocodone-Acetaminophen Nausea And Vomiting  ?  Upset stomach  ? Percocet [Oxycodone-Acetaminophen]   ?  Makes her jittery  ? Valium [Diazepam] Other (See Comments)  ?  Makes pt. Feel "out of wack"  ? ?Objective:  ?There were no vitals filed for this visit. ?There is no height or weight on file to calculate BMI. ?Constitutional Well developed. ?Well nourished.  ?Vascular Foot warm and well perfused. ?Capillary refill normal to all digits.   ?Neurologic Normal speech. ?Oriented to person, place, and time. ?Epicritic sensation to light touch grossly present bilaterally.  ?Dermatologic Skin healing well without signs of infection. Skin edges well coapted without signs of infection.  ?Orthopedic: Tenderness to palpation noted about the surgical site.  ? ?Radiographs: 3 views of skeletally mature adult left foot: Good correction alignment noted.  Osteotomy is noted at the fifth metatarsal.  No callus noted yet ?Assessment:  ? ?1. Porokeratosis   ?2. Plantar flexed metatarsal, left   ?3. Status post foot  surgery   ? ?Plan:  ?Patient was evaluated and treated and all questions answered. ? ?S/p foot surgery left ?-Progressing as expected post-operatively. ?-XR: See above ?-WB Status: Weightbearing as tolerated in cam boot ?-Sutures: Intact.  No clinical signs of [place] is noted.  No complication noted. ?-Medications: None ?-Foot redressed. ? ?No follow-ups on file.  ?

## 2021-07-21 ENCOUNTER — Ambulatory Visit (INDEPENDENT_AMBULATORY_CARE_PROVIDER_SITE_OTHER): Payer: Medicaid Other | Admitting: Podiatry

## 2021-07-21 DIAGNOSIS — Z9889 Other specified postprocedural states: Secondary | ICD-10-CM

## 2021-07-21 DIAGNOSIS — M216X2 Other acquired deformities of left foot: Secondary | ICD-10-CM

## 2021-07-27 ENCOUNTER — Encounter: Payer: Self-pay | Admitting: Nurse Practitioner

## 2021-07-27 NOTE — Progress Notes (Signed)
?Subjective:  ?Patient ID: Kendra Griffith, female    DOB: 01-31-1966,  MRN: [id number] ? ?Chief Complaint  ?Patient presents with  ? Routine Post Op  ?  POV #2 DOS 06/28/2021 LT 5TH METATARSAL FLOATING OSTEOTOMY  ? ? ?DOS: 06/28/21 ?Procedure: Left fifth metatarsal floating osteotomy ? ?56 y.o. female returns for post-op check.  Patient states she is doing well.  No acute complaints.  No pain.  Bandages clean dry and intact. ? ?Review of Systems: Negative except as noted in the HPI. Denies N/V/F/Ch. ? ?Past Medical History:  ?Diagnosis Date  ? ABSCESS, TOOTH 08/20/2009  ? Qualifier: Diagnosis of  By: Jorene Minors, Scott    ? Anemia   ? Anxiety   ? Bipolar disorder ([place])   ? Blood transfusion without reported diagnosis   ? childbirth  ? Chronic pain   ? feet and back  ? Clotting disorder ([place])   ? childbirth  ? Dental caries   ? lesion soft palate  ? Depression   ? Diabetes mellitus without complication ([place])   ? prediabetes  ? GERD (gastroesophageal reflux disease)   ? Hypertension   ? Injury   ? right side from jumping off bunkbed  ? Pre-diabetes   ? Sickle cell trait ([place])   ? Wears glasses   ? ? ?Current Outpatient Medications:  ?  acetaminophen-codeine (TYLENOL #3) 300-30 MG tablet, TAKE 1 TABLET BY MOUTH EVERY 8 HOURS AS NEEDED FOR MODERATE PAIN OR  SEVERE  PAIN, Disp: 30 tablet, Rfl: 0 ?  acetaminophen-codeine (TYLENOL #3) 300-30 MG tablet, Take 1-2 tablets by mouth every 4 (four) hours as needed for moderate pain., Disp: 30 tablet, Rfl: 0 ?  albuterol (VENTOLIN HFA) 108 (90 Base) MCG/ACT inhaler, Inhale 2 puffs into the lungs every 6 (six) hours as needed for wheezing or shortness of breath., Disp: 18 g, Rfl: 1 ?  amitriptyline (ELAVIL) 75 MG tablet, Take 1 tablet (75 mg total) by mouth at bedtime., Disp: 90 tablet, Rfl: 1 ?  amLODipine (NORVASC) 5 MG tablet, Take 1 tablet by mouth once daily for blood pressure, Disp: 90 tablet, Rfl: 0 ?  atorvastatin (LIPITOR) 20 MG tablet, Take 1 tablet (20 mg total) by mouth  daily., Disp: 90 tablet, Rfl: 1 ?  Biotin 5 MG CAPS, Take 5 mg by mouth 3 (three) times daily., Disp: , Rfl:  ?  chlorhexidine (HIBICLENS) 4 % external liquid, Apply topically daily as needed. Use when bathing or showering, Disp: 946 mL, Rfl: 0 ?  cholecalciferol (VITAMIN D3) 25 MCG (1000 UT) tablet, Take 1,000 Units by mouth daily., Disp: , Rfl:  ?  ezetimibe (ZETIA) 10 MG tablet, Take 1 tablet (10 mg total) by mouth daily., Disp: 90 tablet, Rfl: 3 ?  famotidine (PEPCID) 20 MG tablet, Take 1 tablet (20 mg total) by mouth 2 (two) times daily., Disp: 180 tablet, Rfl: 1 ?  ferrous sulfate 324 MG TBEC, Take 324 mg by mouth., Disp: , Rfl:  ?  gabapentin (NEURONTIN) 300 MG capsule, Take 1 capsule (300 mg total) by mouth 2 (two) times daily., Disp: 180 capsule, Rfl: 1 ?  GLUTAMINE PO, Take 500 mg by mouth., Disp: , Rfl:  ?  hydrocortisone (ANUSOL-HC) 2.5 % rectal cream, Place 1 application rectally at bedtime., Disp: 30 g, Rfl: 1 ?  hydroquinone 4 % cream, Apply topically 2 (two) times daily., Disp: , Rfl:  ?  hydrOXYzine (ATARAX) 25 MG tablet, Take 1 tablet (25 mg total) by mouth 3 (three)  times daily as needed., Disp: 90 tablet, Rfl: 1 ?  ibuprofen (ADVIL) 800 MG tablet, Take 1 tablet (800 mg total) by mouth every 8 (eight) hours as needed., Disp: 60 tablet, Rfl: 1 ?  Multiple Vitamin (MULTI-VITAMIN) tablet, Take 1 tablet by mouth daily., Disp: , Rfl:  ?  nicotine polacrilex (NICORETTE) 2 MG gum, Take 1 each (2 mg total) by mouth as needed for smoking cessation., Disp: 100 tablet, Rfl: 0 ?  NONFORMULARY OR COMPOUNDED ITEM, Peripheral Neuropathy Cream: Bupivacaine 1%, Doxepin 3%, Gabapentin 6%, Pentoxifylline 3%, Topiramate 1% Order faxed to [place]: 1 each, Rfl: 3 ?  Omega-3 Fatty Acids (OMEGA 3 500) 500 MG CAPS, Take 500 mg by mouth daily., Disp: , Rfl:  ?  polyethylene glycol powder (GLYCOLAX/MIRALAX) 17 GM/SCOOP powder, Take 17 g by mouth 2 (two) times daily as needed., Disp: 3350 g, Rfl: 1 ?   vitamin B-12 (CYANOCOBALAMIN) 500 MCG tablet, Take 500 mcg by mouth daily., Disp: , Rfl:  ?  vitamin C (ASCORBIC ACID) 500 MG tablet, Take 500 mg by mouth daily., Disp: , Rfl:  ?  Zinc 50 MG CAPS, Take 1 capsule by mouth daily., Disp: , Rfl:  ? ?Social History  ? ?Tobacco Use  ?Smoking Status Every Day  ? Packs/day: 2.00  ? Years: 29.00  ? Pack years: 58.00  ? Types: Cigarettes  ?Smokeless Tobacco Never  ? ? ?Allergies  ?Allergen Reactions  ? Amoxicillin   ?  Pain ?Did it involve swelling of the face/tongue/throat, SOB, or low BP? No ?Did it involve sudden or severe rash/hives, skin peeling, or any reaction on the inside of your mouth or nose? No ?Did you need to seek medical attention at a hospital or doctor's office? Yes ?When did it last happen?      5 years ago ?If all above answers are ?NO?, may proceed with cephalosporin use. ?  ? Darvocet [Propoxyphene N-Acetaminophen] Nausea And Vomiting  ?  Makes her jittery  ? Hydrocodone-Acetaminophen Nausea And Vomiting  ?  Upset stomach  ? Percocet [Oxycodone-Acetaminophen]   ?  Makes her jittery  ? Valium [Diazepam] Other (See Comments)  ?  Makes pt. Feel "out of wack"  ? ?Objective:  ?There were no vitals filed for this visit. ?There is no height or weight on file to calculate BMI. ?Constitutional Well developed. ?Well nourished.  ?Vascular Foot warm and well perfused. ?Capillary refill normal to all digits.   ?Neurologic Normal speech. ?Oriented to person, place, and time. ?Epicritic sensation to light touch grossly present bilaterally.  ?Dermatologic Skin completely reepithelialized.  No lesions noted.  Reduction of pressure noted.  ?Orthopedic: Mild tenderness to palpation noted about the surgical site.  ? ?Radiographs: 3 views of skeletally mature adult left foot: Good correction alignment noted.  Osteotomy is noted at the fifth metatarsal.  No callus noted yet ?Assessment:  ? ?No diagnosis found. ? ?Plan:  ?Patient was evaluated and treated and all questions  answered. ? ?S/p foot surgery left ?-Clinically healed the patient is officially discharged from my care.  If any foot and ankle issues arise in future advised her to come see me.  She states understanding ? ?No follow-ups on file.  ?

## 2021-07-29 ENCOUNTER — Other Ambulatory Visit: Payer: Self-pay | Admitting: Nurse Practitioner

## 2021-07-29 DIAGNOSIS — R61 Generalized hyperhidrosis: Secondary | ICD-10-CM | POA: Diagnosis not present

## 2021-07-29 DIAGNOSIS — L729 Follicular cyst of the skin and subcutaneous tissue, unspecified: Secondary | ICD-10-CM | POA: Diagnosis not present

## 2021-07-29 MED ORDER — DOXYCYCLINE HYCLATE 100 MG PO TABS: 100.0000 mg | ORAL_TABLET | Freq: Two times a day (BID) | ORAL | 0 refills | Status: AC

## 2021-08-01 ENCOUNTER — Other Ambulatory Visit: Payer: Self-pay | Admitting: Nurse Practitioner

## 2021-08-01 DIAGNOSIS — I10 Essential (primary) hypertension: Secondary | ICD-10-CM

## 2021-08-03 NOTE — Telephone Encounter (Signed)
Requested Prescriptions  ?Pending Prescriptions Disp Refills  ?? amLODipine (NORVASC) 5 MG tablet [Pharmacy Med Name: amLODIPine Besylate 5 MG Oral Tablet] 90 tablet 0  ?  Sig: Take 1 tablet by mouth once daily for blood pressure  ?  ? Cardiovascular: Calcium Channel Blockers 2 Failed - 08/01/2021  7:15 PM  ?  ?  Failed - Last BP in normal range  ?  BP Readings from Last 1 Encounters:  ?01/15/21 (!) 152/93  ?   ?  ?  Failed - Valid encounter within last 6 months  ?  Recent Outpatient Visits   ?      ? 6 months ago Encounter for Papanicolaou smear for cervical cancer screening  ? [place], [place] W, NP  ? 9 months ago Rectal abnormality  ? [place], [place], NP  ? 1 year ago Type 2 diabetes mellitus with diabetic polyneuropathy, without long-term current use of insulin ([place])  ? [place], [place] W, NP  ? 1 year ago Type 2 diabetes mellitus with diabetic polyneuropathy, without long-term current use of insulin ([place])  ? [place], Vernia Buff, NP  ? 1 year ago Need for influenza vaccination  ? [place], RPH-CPP  ?  ?  ?Future Appointments   ?        ? In 2 months [name], NP [place]  ?  ? ?  ?  ?  Passed - Last Heart Rate in normal range  ?  Pulse Readings from Last 1 Encounters:  ?01/15/21 73  ?   ?  ?  ? ?

## 2021-08-11 DIAGNOSIS — F314 Bipolar disorder, current episode depressed, severe, without psychotic features: Secondary | ICD-10-CM | POA: Diagnosis not present

## 2021-08-17 DIAGNOSIS — R5383 Other fatigue: Secondary | ICD-10-CM | POA: Diagnosis not present

## 2021-08-17 DIAGNOSIS — E8881 Metabolic syndrome: Secondary | ICD-10-CM | POA: Diagnosis not present

## 2021-08-17 DIAGNOSIS — Z79899 Other long term (current) drug therapy: Secondary | ICD-10-CM | POA: Diagnosis not present

## 2021-08-17 DIAGNOSIS — R0602 Shortness of breath: Secondary | ICD-10-CM | POA: Diagnosis not present

## 2021-08-17 DIAGNOSIS — E7800 Pure hypercholesterolemia, unspecified: Secondary | ICD-10-CM | POA: Diagnosis not present

## 2021-08-17 DIAGNOSIS — E559 Vitamin D deficiency, unspecified: Secondary | ICD-10-CM | POA: Diagnosis not present

## 2021-08-17 DIAGNOSIS — Z131 Encounter for screening for diabetes mellitus: Secondary | ICD-10-CM | POA: Diagnosis not present

## 2021-08-17 DIAGNOSIS — E349 Endocrine disorder, unspecified: Secondary | ICD-10-CM | POA: Diagnosis not present

## 2021-08-17 DIAGNOSIS — R635 Abnormal weight gain: Secondary | ICD-10-CM | POA: Diagnosis not present

## 2021-08-17 DIAGNOSIS — E663 Overweight: Secondary | ICD-10-CM | POA: Diagnosis not present

## 2021-08-18 ENCOUNTER — Encounter: Payer: Self-pay | Admitting: Nurse Practitioner

## 2021-08-20 ENCOUNTER — Ambulatory Visit (HOSPITAL_BASED_OUTPATIENT_CLINIC_OR_DEPARTMENT_OTHER): Payer: Medicaid Other | Admitting: Nurse Practitioner

## 2021-08-20 ENCOUNTER — Encounter: Payer: Self-pay | Admitting: Nurse Practitioner

## 2021-08-20 DIAGNOSIS — M79671 Pain in right foot: Secondary | ICD-10-CM

## 2021-08-20 DIAGNOSIS — G8929 Other chronic pain: Secondary | ICD-10-CM

## 2021-08-20 DIAGNOSIS — M545 Low back pain, unspecified: Secondary | ICD-10-CM | POA: Diagnosis not present

## 2021-08-20 NOTE — Progress Notes (Signed)
Virtual Visit via Telephone Note ? I discussed the limitations, risks, security and privacy concerns of performing an evaluation and management service by telephone and the availability of in person appointments. I also discussed with the patient that there may be a patient responsible charge related to this service. The patient expressed understanding and agreed to proceed.  ? ? ?I connected with Kendra Griffith on 08/20/21  at  10:50 AM EDT  EDT by telephone and verified that I am speaking with the correct person using two identifiers. ? ?Location of Patient: ?Private Residence ?  ?Location of Provider: ?Scientist, research (physical sciences) and [employer] Office  ?  ?Persons participating in Telemedicine visit: ?[name] FNP-BC ?Kendra Griffith  ?  ?History of Present Illness: ?Telemedicine visit for: Physical therapy referral for chronic low back pain and right foot pain ? ?Back Pain: Patient presents for presents evaluation of low back problems.  Symptoms have been present for over 1 year and include pain in the lumbar region  (aching, dull, and throbbing in character; 6/10 in severity) and stiffness in the lower back . Initial inciting event: none. Symptoms are worst: No particular time of the day. Alleviating factors identifiable by patient are none. Exacerbating factors identifiable by patient are  prolonged sitting, prolonged standing, bending, twisting . Treatments so far initiated by patient: none Previous workup: none. Previous treatments:  Prescription NSAIDs with no relief of pain .  There is no sciatica or symptoms of cauda equina ? ?Right foot pain ?She has porokeratotic lesions on her right foot. I have Explained to her that physical therapy may not be able to provide any benefit as it does appear she is going to be surgery for this. ? ?Past Medical History:  ?Diagnosis Date  ? ABSCESS, TOOTH 08/20/2009  ? Qualifier: Diagnosis of  By: Jorene Minors, Scott    ? Anemia   ? Anxiety   ? Bipolar disorder ([place])   ?  Blood transfusion without reported diagnosis   ? childbirth  ? Chronic pain   ? feet and back  ? Clotting disorder ([place])   ? childbirth  ? Dental caries   ? lesion soft palate  ? Depression   ? Diabetes mellitus without complication ([place])   ? prediabetes  ? GERD (gastroesophageal reflux disease)   ? Hypertension   ? Injury   ? right side from jumping off bunkbed  ? Pre-diabetes   ? Sickle cell trait ([place])   ? Wears glasses   ?  ?Past Surgical History:  ?Procedure Laterality Date  ? BARTHOLIN CYST MARSUPIALIZATION N/A 07/16/2019  ? Procedure: EXCISION OF BARTHOLIN CYST;  Surgeon: Chancy Milroy, MD;  Location: [place];  Service: Gynecology;  Laterality: N/A;  ? CESAREAN SECTION    ? three  ? COLONOSCOPY    ? cyst removal 1997    ? FACIAL RECONSTRUCTION SURGERY    ? FOOT SURGERY Bilateral   ? LESION REMOVAL Left 10/15/2018  ? Procedure: Lesion Removal left soft palate;  Surgeon: Diona Browner, DDS;  Location: [facility];  Service: Oral Surgery;  Laterality: Left;  ? TOOTH EXTRACTION Bilateral 10/15/2018  ? Procedure: DENTAL EXTRACTIONS OF TEETH NUMBER TWO, FOUR, FIVE, SIX, SEVEN, EIGHT, NINE, TEN, ELEVEN, TWELVE, THIRTEEN, FOURTEEN, SEVENTEEN, TWENTY-ONE, TWENTY-TWO, TWENTY-THREE, TWENTY-FOUR, TWENTY-FIVE, TWENTY-SIX, TWENTY-SEVEN, TWENTY-EIGHT WITH ALVEOLOPLASTY;  Surgeon: Diona Browner, DDS;  Location: [place];  Service: Oral Surgery;  Laterality: Bilateral;  ? TUBAL LIGATION    ?  ?Family History  ?Problem Relation Age  of Onset  ? Heart disease Mother   ? Asthma Sister   ? Diabetes Sister   ? COPD Sister   ? Diabetes Sister   ? Diabetes Sister   ? Breast cancer Neg Hx   ? Colon cancer Neg Hx   ? Esophageal cancer Neg Hx   ? Pancreatic cancer Neg Hx   ? Liver disease Neg Hx   ? Stomach cancer Neg Hx   ?  ?Social History  ? ?Socioeconomic History  ? Marital status: Single  ?  Spouse name: Not on file  ? Number of children: 3  ? Years of education: 8-9  ? Highest education level: Not on file  ?Occupational  History  ? Occupation: applying for disability  ?Tobacco Use  ? Smoking status: Every Day  ?  Packs/day: 2.00  ?  Years: 29.00  ?  Pack years: 58.00  ?  Types: Cigarettes  ? Smokeless tobacco: Never  ?Vaping Use  ? Vaping Use: Never used  ?Substance and Sexual Activity  ? Alcohol use: No  ?  Alcohol/week: 0.0 standard drinks  ?  Comment: Stopped drinking 2013  ? Drug use: No  ? Sexual activity: Not Currently  ?  Birth control/protection: None  ?Other Topics Concern  ? Not on file  ?Social History Narrative  ? Patient lives alone in an apartment on the first floor.  3 children.  Applying for disability.  Education: 8th or 9th grade.  ? Currently not working  ? Trying to go back to school online for GED  ? Six Grandchildren  ? ?Social Determinants of Health  ? ?Financial Resource Strain: Not on file  ?Food Insecurity: Not on file  ?Transportation Needs: Not on file  ?Physical Activity: Not on file  ?Stress: Not on file  ?Social Connections: Not on file  ?  ? ?Observations/Objective: ?Awake, alert and oriented x 3 ? ? ?Review of Systems  ?Constitutional:  Negative for fever, malaise/fatigue and weight loss.  ?HENT: Negative.  Negative for nosebleeds.   ?Eyes: Negative.  Negative for blurred vision, double vision and photophobia.  ?Respiratory: Negative.  Negative for cough and shortness of breath.   ?Cardiovascular: Negative.  Negative for chest pain, palpitations and leg swelling.  ?Gastrointestinal: Negative.  Negative for heartburn, nausea and vomiting.  ?Musculoskeletal:  Positive for back pain, joint pain and myalgias.  ?Neurological: Negative.  Negative for dizziness, focal weakness, seizures and headaches.  ?Psychiatric/Behavioral: Negative.  Negative for suicidal ideas.    ?Assessment and Plan: ?Diagnoses and all orders for this visit: ? ?Chronic bilateral low back pain without sciatica ?-     Ambulatory referral to Physical Therapy ? ?Chronic foot pain, right ?-     Ambulatory referral to Physical Therapy ? ?   ? ?Follow Up Instructions ?Return if symptoms worsen or fail to improve.  ? ?  ?I discussed the assessment and treatment plan with the patient. The patient was provided an opportunity to ask questions and all were answered. The patient agreed with the plan and demonstrated an understanding of the instructions. ?  ?The patient was advised to call back or seek an in-person evaluation if the symptoms worsen or if the condition fails to improve as anticipated. ? ?I provided 11 minutes of non-face-to-face time during this encounter including median intraservice time, reviewing previous notes, labs, imaging, medications and explaining diagnosis and management. ? ?[name], FNP-BC  ?

## 2021-08-25 ENCOUNTER — Encounter: Payer: Self-pay | Admitting: Nurse Practitioner

## 2021-08-26 DIAGNOSIS — F314 Bipolar disorder, current episode depressed, severe, without psychotic features: Secondary | ICD-10-CM | POA: Diagnosis not present

## 2021-08-27 ENCOUNTER — Other Ambulatory Visit: Payer: Self-pay | Admitting: Nurse Practitioner

## 2021-08-27 ENCOUNTER — Encounter: Payer: Self-pay | Admitting: Nurse Practitioner

## 2021-08-27 DIAGNOSIS — I10 Essential (primary) hypertension: Secondary | ICD-10-CM

## 2021-09-08 DIAGNOSIS — F314 Bipolar disorder, current episode depressed, severe, without psychotic features: Secondary | ICD-10-CM | POA: Diagnosis not present

## 2021-09-15 ENCOUNTER — Encounter: Payer: Self-pay | Admitting: *Deleted

## 2021-09-20 DIAGNOSIS — R600 Localized edema: Secondary | ICD-10-CM | POA: Diagnosis not present

## 2021-09-21 DIAGNOSIS — F314 Bipolar disorder, current episode depressed, severe, without psychotic features: Secondary | ICD-10-CM | POA: Diagnosis not present

## 2021-10-04 ENCOUNTER — Encounter: Payer: Self-pay | Admitting: Nurse Practitioner

## 2021-10-04 ENCOUNTER — Ambulatory Visit: Payer: Medicaid Other | Attending: Nurse Practitioner | Admitting: Nurse Practitioner

## 2021-10-04 VITALS — BP 148/89 | HR 81 | Ht 65.0 in | Wt 169.0 lb

## 2021-10-04 DIAGNOSIS — E663 Overweight: Secondary | ICD-10-CM

## 2021-10-04 DIAGNOSIS — G894 Chronic pain syndrome: Secondary | ICD-10-CM | POA: Diagnosis not present

## 2021-10-04 DIAGNOSIS — F419 Anxiety disorder, unspecified: Secondary | ICD-10-CM

## 2021-10-04 DIAGNOSIS — E669 Obesity, unspecified: Secondary | ICD-10-CM

## 2021-10-04 DIAGNOSIS — Z23 Encounter for immunization: Secondary | ICD-10-CM | POA: Diagnosis not present

## 2021-10-04 DIAGNOSIS — K219 Gastro-esophageal reflux disease without esophagitis: Secondary | ICD-10-CM

## 2021-10-04 DIAGNOSIS — Z6828 Body mass index (BMI) 28.0-28.9, adult: Secondary | ICD-10-CM

## 2021-10-04 DIAGNOSIS — I10 Essential (primary) hypertension: Secondary | ICD-10-CM

## 2021-10-04 DIAGNOSIS — R32 Unspecified urinary incontinence: Secondary | ICD-10-CM | POA: Diagnosis not present

## 2021-10-04 DIAGNOSIS — F32A Depression, unspecified: Secondary | ICD-10-CM | POA: Diagnosis not present

## 2021-10-04 DIAGNOSIS — Z1231 Encounter for screening mammogram for malignant neoplasm of breast: Secondary | ICD-10-CM

## 2021-10-04 DIAGNOSIS — E1142 Type 2 diabetes mellitus with diabetic polyneuropathy: Secondary | ICD-10-CM | POA: Diagnosis not present

## 2021-10-04 MED ORDER — FAMOTIDINE 20 MG PO TABS: 20.0000 mg | ORAL_TABLET | Freq: Two times a day (BID) | ORAL | 1 refills | Status: DC

## 2021-10-04 MED ORDER — AMLODIPINE BESYLATE 10 MG PO TABS: 10.0000 mg | ORAL_TABLET | Freq: Every day | ORAL | 1 refills | Status: DC

## 2021-10-04 MED ORDER — ACETAMINOPHEN-CODEINE 300-30 MG PO TABS: 1.0000 | ORAL_TABLET | ORAL | 0 refills | Status: DC | PRN

## 2021-10-04 MED ORDER — AMITRIPTYLINE HCL 75 MG PO TABS: 75.0000 mg | ORAL_TABLET | Freq: Every day | ORAL | 1 refills | Status: DC

## 2021-10-04 MED ORDER — OMEPRAZOLE 20 MG PO CPDR: 20.0000 mg | DELAYED_RELEASE_CAPSULE | Freq: Every day | ORAL | 1 refills | Status: DC

## 2021-10-04 MED ORDER — GABAPENTIN 300 MG PO CAPS: 300.0000 mg | ORAL_CAPSULE | Freq: Two times a day (BID) | ORAL | 1 refills | Status: DC

## 2021-10-04 NOTE — Progress Notes (Signed)
Assessment & Plan:  Kendra Griffith was seen today for hypertension and diabetes.  Diagnoses and all orders for this visit:  Type 2 diabetes mellitus with diabetic polyneuropathy, without long-term current use of insulin (HCC) -     Hemoglobin A1c -     atorvastatin (LIPITOR) 10 MG tablet; Take 1 tablet (10 mg total) by mouth daily.  Essential hypertension -     CMP14+EGFR -     amLODipine (NORVASC) 10 MG tablet; Take 1 tablet (10 mg total) by mouth daily. for blood pressure  Urinary incontinence, unspecified type -     Urinalysis, Complete -     Microscopic Examination  Chronic pain syndrome -     Ambulatory referral to Physical Therapy -     amitriptyline (ELAVIL) 75 MG tablet; Take 1 tablet (75 mg total) by mouth at bedtime. -     gabapentin (NEURONTIN) 300 MG capsule; Take 1 capsule (300 mg total) by mouth 2 (two) times daily. -     acetaminophen-codeine (TYLENOL #3) 300-30 MG tablet; Take 1-2 tablets by mouth every 4 (four) hours as needed for moderate pain.  Anxiety and depression -     amitriptyline (ELAVIL) 75 MG tablet; Take 1 tablet (75 mg total) by mouth at bedtime. -     gabapentin (NEURONTIN) 300 MG capsule; Take 1 capsule (300 mg total) by mouth 2 (two) times daily.  Gastroesophageal reflux disease without esophagitis -     famotidine (PEPCID) 20 MG tablet; Take 1 tablet (20 mg total) by mouth 2 (two) times daily. -     omeprazole (PRILOSEC) 20 MG capsule; Take 1 capsule (20 mg total) by mouth daily.  Overweight (BMI 25.0-29.9) -     Ambulatory referral to [facility]  Breast cancer screening by mammogram -     Cancel: MM DIAG BREAST TOMO BILATERAL; Future -     MM 3D SCREEN BREAST BILATERAL; Future  Need for shingles vaccine -     Varicella-zoster vaccine IM    Patient has been counseled on age-appropriate routine health concerns for screening and prevention. These are reviewed and up-to-date. Referrals have been placed accordingly.  Immunizations are up-to-date or declined.    Subjective:   Chief Complaint  Patient presents with   Hypertension   Diabetes   Kendra Griffith 56 y.o. female presents to office today for for follow up to HTN and DM.  She has a past medical history of dental abscess (08/20/2009), Anemia, Anxiety, Bipolar disorder, Chronic pain, Clotting disorder,  Depression, DM2, GERD.  Hypertension, Sickle cell trait     She has chronic low back pain. Requesting referral to [facility] for Physical therapy for her back and for weight management education/training.  Symptoms have been present for over 1 year and include pain in the lumbar region  (aching, dull, and throbbing in character; 6/10 in severity) and stiffness in the lower back . Initial inciting event: none. Symptoms are worst: No particular time of the day. Alleviating factors identifiable by patient are none. Exacerbating factors identifiable by patient are  prolonged sitting, prolonged standing, bending, twisting . There is no sciatica or symptoms of cauda equina Current medications: Tylenol 3, gabapentin, elavil She also has right foot pain due to porokeratotic lesions on her right foot. I have Explained to her that physical therapy may not be able to provide any benefit as it does appear she is being followed by podiatry for this.    Urinary Incontinence Onset 1 year  ago. HaA few accidents. Started last year. She also has accidents during the day. Using pads. Urinary urgency. Medium sized adult diapres and chucks.  She leaks urine with bending, coughing, lifting, standing, walking, with urge, with a full bladder, during the night, with activity. Patient describes the symptoms as  nocturia several times per night, urge to urinate with little or no warning, urine leakage with coughing/heavy physical activity, and urine leaking unpredictably.  Factors associated with symptoms include none known. Evaluation to date includes UA/CS: normal.Treatment to  date includes none.     HTN Blood pressure is not at goal. Will increase amlodipine from 5 mg to 10 mg.  BP Readings from Last 3 Encounters:  10/04/21 (!) 148/89  01/15/21 (!) 152/93  10/26/20 131/88   DM 2 Well controlled at this time with diet only.  Lab Results  Component Value Date   HGBA1C 6.3 (H) 10/04/2021      Review of Systems  Constitutional:  Negative for fever, malaise/fatigue and weight loss.  HENT: Negative.  Negative for nosebleeds.   Eyes: Negative.  Negative for blurred vision, double vision and photophobia.  Respiratory: Negative.  Negative for cough and shortness of breath.   Cardiovascular: Negative.  Negative for chest pain, palpitations and leg swelling.  Gastrointestinal:  Positive for heartburn. Negative for abdominal pain, blood in stool, constipation, diarrhea, melena, nausea and vomiting.  Genitourinary:  Positive for frequency and urgency. Negative for dysuria, flank pain and hematuria.       SEE Kendra  Musculoskeletal:  Positive for back pain and myalgias.  Neurological: Negative.  Negative for dizziness, focal weakness, seizures and headaches.  Psychiatric/Behavioral:  Positive for depression. Negative for suicidal ideas. The patient is nervous/anxious.     Past Medical History:  Diagnosis Date   ABSCESS, TOOTH 08/20/2009   Qualifier: Diagnosis of  By: Jorene Minors, Scott     Anemia    Anxiety    Bipolar disorder ([place])    Blood transfusion without reported diagnosis    childbirth   Chronic pain    feet and back   Clotting disorder ([place])    childbirth   Dental caries    lesion soft palate   Depression    Diabetes mellitus without complication ([place])    prediabetes   GERD (gastroesophageal reflux disease)    Hypertension    Injury    right side from jumping off bunkbed   Pre-diabetes    Sickle cell trait ([place])    Wears glasses     Past Surgical History:  Procedure Laterality Date   BARTHOLIN CYST MARSUPIALIZATION N/A 07/16/2019    Procedure: EXCISION OF BARTHOLIN CYST;  Surgeon: Chancy Milroy, MD;  Location: [place];  Service: Gynecology;  Laterality: N/A;   CESAREAN SECTION     three   COLONOSCOPY     cyst removal 1997     FACIAL RECONSTRUCTION SURGERY     FOOT SURGERY Bilateral    LESION REMOVAL Left 10/15/2018   Procedure: Lesion Removal left soft palate;  Surgeon: Diona Browner, DDS;  Location: [place];  Service: Oral Surgery;  Laterality: Left;   TOOTH EXTRACTION Bilateral 10/15/2018   Procedure: DENTAL EXTRACTIONS OF TEETH NUMBER TWO, FOUR, FIVE, SIX, SEVEN, EIGHT, NINE, TEN, ELEVEN, TWELVE, THIRTEEN, FOURTEEN, SEVENTEEN, TWENTY-ONE, TWENTY-TWO, TWENTY-THREE, TWENTY-FOUR, TWENTY-FIVE, TWENTY-SIX, TWENTY-SEVEN, TWENTY-EIGHT WITH ALVEOLOPLASTY;  Surgeon: Diona Browner, DDS;  Location: [place];  Service: Oral Surgery;  Laterality: Bilateral;   TUBAL LIGATION      Family  History  Problem Relation Age of Onset   Heart disease Mother    Asthma Sister    Diabetes Sister    COPD Sister    Diabetes Sister    Diabetes Sister    Breast cancer Neg Hx    Colon cancer Neg Hx    Esophageal cancer Neg Hx    Pancreatic cancer Neg Hx    Liver disease Neg Hx    Stomach cancer Neg Hx     Social History Reviewed with no changes to be made today.   Outpatient Medications Prior to Visit  Medication Sig Dispense Refill   albuterol (VENTOLIN HFA) 108 (90 Base) MCG/ACT inhaler Inhale 2 puffs into the lungs every 6 (six) hours as needed for wheezing or shortness of breath. 18 g 1   Biotin 5 MG CAPS Take 5 mg by mouth 3 (three) times daily.     chlorhexidine (HIBICLENS) 4 % external liquid Apply topically daily as needed. Use when bathing or showering 946 mL 0   cholecalciferol (VITAMIN D3) 25 MCG (1000 UT) tablet Take 1,000 Units by mouth daily.     ferrous sulfate 324 MG TBEC Take 324 mg by mouth.     GLUTAMINE PO Take 500 mg by mouth.     hydrocortisone (ANUSOL-HC) 2.5 % rectal cream Place 1 application  rectally at bedtime. 30 g 1   hydroquinone 4 % cream Apply topically 2 (two) times daily.     hydrOXYzine (ATARAX) 25 MG tablet Take 1 tablet (25 mg total) by mouth 3 (three) times daily as needed. 90 tablet 1   ibuprofen (ADVIL) 800 MG tablet Take 1 tablet (800 mg total) by mouth every 8 (eight) hours as needed. 60 tablet 1   Multiple Vitamin (MULTI-VITAMIN) tablet Take 1 tablet by mouth daily.     nicotine polacrilex (NICORETTE) 2 MG gum Take 1 each (2 mg total) by mouth as needed for smoking cessation. 100 tablet 0   NONFORMULARY OR COMPOUNDED ITEM Peripheral Neuropathy Cream: Bupivacaine 1%, Doxepin 3%, Gabapentin 6%, Pentoxifylline 3%, Topiramate 1% Order faxed to [place] 1 each 3   Omega-3 Fatty Acids (OMEGA 3 500) 500 MG CAPS Take 500 mg by mouth daily.     polyethylene glycol powder (GLYCOLAX/MIRALAX) 17 GM/SCOOP powder Take 17 g by mouth 2 (two) times daily as needed. 3350 g 1   vitamin B-12 (CYANOCOBALAMIN) 500 MCG tablet Take 500 mcg by mouth daily.     vitamin C (ASCORBIC ACID) 500 MG tablet Take 500 mg by mouth daily.     Zinc 50 MG CAPS Take 1 capsule by mouth daily.     acetaminophen-codeine (TYLENOL #3) 300-30 MG tablet Take 1-2 tablets by mouth every 4 (four) hours as needed for moderate pain. 30 tablet 0   amLODipine (NORVASC) 5 MG tablet Take 1 tablet by mouth once daily for blood pressure 90 tablet 0   ezetimibe (ZETIA) 10 MG tablet Take 1 tablet (10 mg total) by mouth daily. 90 tablet 3   acetaminophen-codeine (TYLENOL #3) 300-30 MG tablet TAKE 1 TABLET BY MOUTH EVERY 8 HOURS AS NEEDED FOR MODERATE PAIN OR  SEVERE  PAIN (Patient not taking: Reported on 10/04/2021) 30 tablet 0   amitriptyline (ELAVIL) 75 MG tablet Take 1 tablet (75 mg total) by mouth at bedtime. 90 tablet 1   famotidine (PEPCID) 20 MG tablet Take 1 tablet (20 mg total) by mouth 2 (two) times daily. 180 tablet 1   gabapentin (NEURONTIN) 300 MG capsule Take  1 capsule (300 mg total) by mouth 2 (two)  times daily. 180 capsule 1   No facility-administered medications prior to visit.    Allergies  Allergen Reactions   Amoxicillin     Pain Did it involve swelling of the face/tongue/throat, SOB, or low BP? No Did it involve sudden or severe rash/hives, skin peeling, or any reaction on the inside of your mouth or nose? No Did you need to seek medical attention at a hospital or doctor's office? Yes When did it last happen?      5 years ago If all above answers are "NO", may proceed with cephalosporin use.    Darvocet [Propoxyphene N-Acetaminophen] Nausea And Vomiting    Makes her jittery   Hydrocodone-Acetaminophen Nausea And Vomiting    Upset stomach   Percocet [Oxycodone-Acetaminophen]     Makes her jittery   Valium [Diazepam] Other (See Comments)    Makes pt. Feel "out of wack"       Objective:    BP (!) 148/89   Pulse 81   Ht $R'5\' 5"'Nz$  (1.651 m)   Wt 169 lb (76.7 kg)   LMP 03/12/2019 (Approximate)   SpO2 99%   Breastfeeding No   BMI 28.12 kg/m  Wt Readings from Last 3 Encounters:  10/04/21 169 lb (76.7 kg)  01/15/21 168 lb (76.2 kg)  10/26/20 173 lb 3.2 oz (78.6 kg)    Physical Exam Vitals and nursing note reviewed.  Constitutional:      Appearance: She is well-developed.  HENT:     Head: Normocephalic and atraumatic.  Cardiovascular:     Rate and Rhythm: Normal rate and regular rhythm.     Heart sounds: Normal heart sounds. No murmur heard.    No friction rub. No gallop.  Pulmonary:     Effort: Pulmonary effort is normal. No tachypnea or respiratory distress.     Breath sounds: Normal breath sounds. No decreased breath sounds, wheezing, rhonchi or rales.  Chest:     Chest wall: No tenderness.  Abdominal:     General: Bowel sounds are normal.     Palpations: Abdomen is soft.  Musculoskeletal:        General: No swelling or deformity. Normal range of motion.     Cervical back: Normal range of motion.  Skin:    General: Skin is warm and dry.  Neurological:      Mental Status: She is alert and oriented to person, place, and time.     Coordination: Coordination normal.  Psychiatric:        Behavior: Behavior normal. Behavior is cooperative.        Thought Content: Thought content normal.        Judgment: Judgment normal.          Patient has been counseled extensively about nutrition and exercise as well as the importance of adherence with medications and regular follow-up. The patient was given clear instructions to go to ER or return to medical center if symptoms don't improve, worsen or new problems develop. The patient verbalized understanding.   Follow-up: Return in about 3 months (around 01/04/2022).   [name], FNP-BC [facility] and [place] [facility], [place]   10/12/2021, 9:20 AM

## 2021-10-05 LAB — URINALYSIS, COMPLETE
Bilirubin, UA: NEGATIVE
Glucose, UA: NEGATIVE
Ketones, UA: NEGATIVE
Leukocytes,UA: NEGATIVE
Nitrite, UA: NEGATIVE
Protein,UA: NEGATIVE
RBC, UA: NEGATIVE
Specific Gravity, UA: 1.005 (ref 1.005–1.030)
Urobilinogen, Ur: 0.2 mg/dL (ref 0.2–1.0)
pH, UA: 7 (ref 5.0–7.5)

## 2021-10-05 LAB — CMP14+EGFR
ALT: 19 IU/L (ref 0–32)
AST: 21 IU/L (ref 0–40)
Albumin/Globulin Ratio: 2 (ref 1.2–2.2)
Albumin: 4.8 g/dL (ref 3.8–4.9)
Alkaline Phosphatase: 134 IU/L — ABNORMAL HIGH (ref 44–121)
BUN/Creatinine Ratio: 8 — ABNORMAL LOW (ref 9–23)
BUN: 8 mg/dL (ref 6–24)
Bilirubin Total: 0.2 mg/dL (ref 0.0–1.2)
CO2: 26 mmol/L (ref 20–29)
Calcium: 10.5 mg/dL — ABNORMAL HIGH (ref 8.7–10.2)
Chloride: 106 mmol/L (ref 96–106)
Creatinine, Ser: 0.96 mg/dL (ref 0.57–1.00)
Globulin, Total: 2.4 g/dL (ref 1.5–4.5)
Glucose: 93 mg/dL (ref 70–99)
Potassium: 4.1 mmol/L (ref 3.5–5.2)
Sodium: 146 mmol/L — ABNORMAL HIGH (ref 134–144)
Total Protein: 7.2 g/dL (ref 6.0–8.5)
eGFR: 70 mL/min/{1.73_m2} (ref >59)

## 2021-10-05 LAB — HEMOGLOBIN A1C
Est. average glucose Bld gHb Est-mCnc: 134 mg/dL
Hgb A1c MFr Bld: 6.3 % — ABNORMAL HIGH (ref 4.8–5.6)

## 2021-10-05 LAB — MICROSCOPIC EXAMINATION
Bacteria, UA: NONE SEEN
Casts: NONE SEEN /lpf
RBC, Urine: NONE SEEN /hpf (ref 0–2)
WBC, UA: NONE SEEN /hpf (ref 0–5)

## 2021-10-07 DIAGNOSIS — I209 Angina pectoris, unspecified: Secondary | ICD-10-CM | POA: Diagnosis not present

## 2021-10-12 ENCOUNTER — Encounter: Payer: Self-pay | Admitting: Nurse Practitioner

## 2021-10-12 MED ORDER — ATORVASTATIN CALCIUM 10 MG PO TABS: 10.0000 mg | ORAL_TABLET | Freq: Every day | ORAL | 3 refills | Status: DC

## 2021-10-21 ENCOUNTER — Ambulatory Visit: Payer: Medicaid Other

## 2021-10-25 ENCOUNTER — Ambulatory Visit (INDEPENDENT_AMBULATORY_CARE_PROVIDER_SITE_OTHER): Payer: Medicaid Other | Admitting: Podiatry

## 2021-10-25 ENCOUNTER — Encounter: Payer: Self-pay | Admitting: Podiatry

## 2021-10-25 DIAGNOSIS — M79675 Pain in left toe(s): Secondary | ICD-10-CM | POA: Diagnosis not present

## 2021-10-25 DIAGNOSIS — E1142 Type 2 diabetes mellitus with diabetic polyneuropathy: Secondary | ICD-10-CM

## 2021-10-25 DIAGNOSIS — Q828 Other specified congenital malformations of skin: Secondary | ICD-10-CM

## 2021-10-25 DIAGNOSIS — B351 Tinea unguium: Secondary | ICD-10-CM | POA: Diagnosis not present

## 2021-10-25 DIAGNOSIS — G6289 Other specified polyneuropathies: Secondary | ICD-10-CM | POA: Diagnosis not present

## 2021-10-25 DIAGNOSIS — M79674 Pain in right toe(s): Secondary | ICD-10-CM | POA: Diagnosis not present

## 2021-10-25 NOTE — Progress Notes (Signed)
This patient returns to my office for at risk foot care.  This patient requires this care by a professional since this patient will be at risk due to having diabetes.  This patient is unable to cut nails herself since the patient cannot reach her nails.These nails are painful walking and wearing shoes.  This patient presents for at risk foot care today.  General Appearance  Alert, conversant and in no acute stress.  Vascular  Dorsalis pedis and posterior tibial  pulses are palpable  bilaterally.  Capillary return is within normal limits  bilaterally. Temperature is within normal limits  bilaterally.  Neurologic  Senn-Weinstein monofilament wire test within normal limits  bilaterally. Muscle power within normal limits bilaterally.  Nails Thick disfigured discolored nails with subungual debris  from hallux to fifth toes bilaterally. No evidence of bacterial infection or drainage bilaterally.  Orthopedic  No limitations of motion  feet .  No crepitus or effusions noted.  No bony pathology or digital deformities noted.  Skin  normotropic skin with no porokeratosis noted bilaterally.  No signs of infections or ulcers noted.  Porokeratosis sub 2 left and sub 5  right.   Onychomycosis  Pain in right toes  Pain in left toes Porokeratosis  B/L.  Consent was obtained for treatment procedures.   Mechanical debridement of nails 1-5  bilaterally performed with a nail nipper.  Filed with dremel without incident. Debride callus with # 15 blade followed by dremel tool usage.   Return office visit   12 weeks                  Told patient to return for periodic foot care and evaluation due to potential at risk complications.   Gardiner Barefoot DPM

## 2021-11-01 DIAGNOSIS — R0989 Other specified symptoms and signs involving the circulatory and respiratory systems: Secondary | ICD-10-CM | POA: Diagnosis not present

## 2021-11-01 DIAGNOSIS — R9431 Abnormal electrocardiogram [ECG] [EKG]: Secondary | ICD-10-CM | POA: Diagnosis not present

## 2021-11-01 DIAGNOSIS — M79605 Pain in left leg: Secondary | ICD-10-CM | POA: Diagnosis not present

## 2021-11-01 DIAGNOSIS — R600 Localized edema: Secondary | ICD-10-CM | POA: Diagnosis not present

## 2021-11-01 DIAGNOSIS — M79604 Pain in right leg: Secondary | ICD-10-CM | POA: Diagnosis not present

## 2021-11-02 ENCOUNTER — Ambulatory Visit (HOSPITAL_BASED_OUTPATIENT_CLINIC_OR_DEPARTMENT_OTHER): Payer: Medicaid Other | Admitting: Physical Therapy

## 2021-11-09 NOTE — Therapy (Incomplete)
OUTPATIENT PHYSICAL THERAPY THORACOLUMBAR EVALUATION   Patient Name: Kendra Griffith MRN: [id number] DOB:19-Oct-1965, 56 y.o., female Today's Date: 11/11/2021   PT End of Session - 11/11/21 1226     Visit Number 1    Number of Visits 16    Date for PT Re-Evaluation 01/06/22    Authorization Type [place] MCD [employer]    Progress Note Due on Visit 10    PT Start Time 1121    PT Stop Time 1219    PT Time Calculation (min) 58 min    Activity Tolerance Patient tolerated treatment well;No increased pain    Behavior During Therapy [facility] for tasks assessed/performed             Past Medical History:  Diagnosis Date   ABSCESS, TOOTH 08/20/2009   Qualifier: Diagnosis of  By: Jorene Minors, Scott     Anemia    Anxiety    Bipolar disorder ([place])    Blood transfusion without reported diagnosis    childbirth   Chronic pain    feet and back   Clotting disorder ([place])    childbirth   Dental caries    lesion soft palate   Depression    Diabetes mellitus without complication ([place])    prediabetes   GERD (gastroesophageal reflux disease)    Hypertension    Injury    right side from jumping off bunkbed   Pre-diabetes    Sickle cell trait ([place])    Wears glasses    Past Surgical History:  Procedure Laterality Date   BARTHOLIN CYST MARSUPIALIZATION N/A 07/16/2019   Procedure: EXCISION OF BARTHOLIN CYST;  Surgeon: Chancy Milroy, MD;  Location: [place];  Service: Gynecology;  Laterality: N/A;   CESAREAN SECTION     three   COLONOSCOPY     cyst removal 1997     FACIAL RECONSTRUCTION SURGERY     FOOT SURGERY Bilateral    LESION REMOVAL Left 10/15/2018   Procedure: Lesion Removal left soft palate;  Surgeon: Diona Browner, DDS;  Location: [place];  Service: Oral Surgery;  Laterality: Left;   TOOTH EXTRACTION Bilateral 10/15/2018   Procedure: DENTAL EXTRACTIONS OF TEETH NUMBER TWO, FOUR, FIVE, SIX, SEVEN, EIGHT, NINE, TEN, ELEVEN, TWELVE, THIRTEEN, FOURTEEN, SEVENTEEN, TWENTY-ONE,  TWENTY-TWO, TWENTY-THREE, TWENTY-FOUR, TWENTY-FIVE, TWENTY-SIX, TWENTY-SEVEN, TWENTY-EIGHT WITH ALVEOLOPLASTY;  Surgeon: Diona Browner, DDS;  Location: [place];  Service: Oral Surgery;  Laterality: Bilateral;   TUBAL LIGATION     Patient Active Problem List   Diagnosis Date Noted   Post-operative state 72/62/0355   Alcoholic peripheral neuropathy ([place]) 06/09/2017   Tobacco use 06/09/2017   Breast cancer screening 01/21/2016   Chronic pain syndrome 09/25/2014   Type 2 diabetes mellitus with diabetic polyneuropathy, without long-term current use of insulin ([place]) 06/30/2014   Peripheral neuropathy 05/09/2013   Poor dentition 02/01/2013   Pain in joint, ankle and foot 02/01/2013   Essential hypertension, benign 02/01/2013   FOOT PAIN, BILATERAL 06/16/2010   RECTAL BLEEDING 12/08/2009   HEMATURIA UNSPECIFIED 12/08/2009   DEPRESSION 08/20/2009   Mixed hyperlipidemia 07/30/2009   ANEMIA-NOS 07/30/2009   GERD 07/30/2009   CALLUS, FOOT 07/30/2009    PCP: [name], NP  REFERRING PROVIDER: [name], NP  REFERRING DIAG: G89.4 (ICD-10-CM) - Chronic pain syndrome  Rationale for Evaluation and Treatment Rehabilitation  THERAPY DIAG:  Other low back pain  Muscle weakness (generalized)  Pain in right leg  Unsteadiness on feet  ONSET DATE: 3 years  SUBJECTIVE:  SUBJECTIVE STATEMENT: I have always been off balance. L foot surgery April 2023 and that hasn't helped; will have the R done in the future. I have a lot of muscle spasms in my back but I don't get any medication for it. I have foot neuropathy. My knees are weak and my R knee gives out the most.  PERTINENT HISTORY:  Lower back muscle spasms R side and have Right leg craps from the calf and all the way down. Pt reports dehydration.  Smoker. Had PT before 2020 or 2021.   PAIN:  Are you having pain? Yes: NPRS scale: 0/10 Pain location: R lumbar Pain description: cramping, sharpness Aggravating factors: leg elevation sometimes, supine positioning Relieving factors: stretching   PRECAUTIONS: fall risk  WEIGHT BEARING RESTRICTIONS No  FALLS:  Has patient fallen in last 6 months? Yes. Number of falls 2-catches self  LIVING ENVIRONMENT: Lives with: lives with their family Lives in: House/apartment Has following equipment at home: Single point cane and Environmental consultant - 4 wheeled  OCCUPATION: not working  PLOF: Independent  PATIENT GOALS to not have pain   OBJECTIVE:   DIAGNOSTIC FINDINGS:  08/24/17 XR: L5-S1 disc degeneration.  Lower lumbar facet degeneration  PATIENT SURVEYS:  Time constraint this visit  SCREENING FOR RED FLAGS: Bowel or bladder incontinence: Yes: daily Spinal tumors: No Cauda equina syndrome: No Compression fracture: No Abdominal aneurysm: No  COGNITION:  Overall cognitive status: Within functional limits for tasks assessed     SENSATION: WFL    POSTURE: rounded shoulders and right pelvic obliquity  PALPATION: L2-4 hypomobile, R lumbar paraspinals tighter, R hamstring tighter, R spinal alignment normal; R sacral torsion outward, no TTP R Piriformis, R outflare  LUMBAR ROM:   Active  A/PROM  eval  Flexion 25% limited  Extension 25% limited  Right lateral flexion WFL  Left lateral flexion WFL  Right rotation 75% limited  Left rotation 75% limited   (Blank rows = not tested)  LOWER EXTREMITY ROM:     Passive  Right eval Left eval  Hip flexion [facility] WFL  Hip extension limited limited  Hip abduction    Hip adduction    Hip internal rotation [facility] WFL  Hip external rotation [facility]  Knee flexion AROM WNL AROM WNL  Knee extension [facility] WFL  Ankle dorsiflexion [facility] WFL  Ankle plantarflexion    Ankle inversion    Ankle eversion     (Blank rows = not tested)  LOWER  EXTREMITY MMT:    MMT Right eval Left eval  Hip flexion 4+/5 4+/5  Hip extension 3/5 3/5  Hip abduction    Hip adduction    Hip internal rotation    Hip external rotation    Knee flexion 4+/5 4+/5  Knee extension 4+/5 4+/5  Ankle dorsiflexion 4+/5 4+/5  Ankle plantarflexion    Ankle inversion    Ankle eversion     (Blank rows = not tested)  LUMBAR SPECIAL TESTS:  Prone instability test: Negative, Straight leg raise test: Negative, Slump test: Negative, and Single leg stance test: Negative  FUNCTIONAL TESTS:  Berg Balance Scale: 38/56  GAIT: Distance walked: in gym Assistive device utilized: None Level of assistance: Complete Independence Comments: slow and calculated    TODAY'S TREATMENT  11/11/21:   PATIENT EDUCATION:  Education details: HEP, log roll technique Person educated: Patient Education method: Explanation, Demonstration, and Handouts Education comprehension: verbalized understanding and returned demonstration   HOME EXERCISE PROGRAM: Access Code: [email or web address] URL: [email or web address] Date: 11/11/2021 Prepared  by: America Brown  Exercises - Supine Single Knee to Chest Stretch  - 2 x daily - 7 x weekly - 1 sets - 2-3 reps - 30 sec hold - Supine Piriformis Stretch with Leg Straight  - 2 x daily - 7 x weekly - 1 sets - 2-3 reps - 30 sec hold - Supine Hamstring Stretch  - 2 x daily - 7 x weekly - 1 sets - 2-3 reps - 30 sec hold  ASSESSMENT:  CLINICAL IMPRESSION: Patient is a 56 y.o. female who was seen today for physical therapy evaluation and treatment for R lumbar pain with R LE cramping. Pt also demonstrates poor balance and bil hip extensor weakness. Pt exhibits R sacral torsion and R pelvic outflare, R lumbar paraspinal tightness, R hamstring tightness. Pt would benefit from manual therapy, therex, balance and education to address deficits.    OBJECTIVE IMPAIRMENTS Abnormal gait, decreased activity tolerance, decreased balance, decreased  mobility, difficulty walking, decreased strength, hypomobility, increased muscle spasms, impaired flexibility, postural dysfunction, and pain.   ACTIVITY LIMITATIONS lifting, bending, sitting, standing, squatting, transfers, bed mobility, and locomotion level  PARTICIPATION LIMITATIONS: meal prep, cleaning, and community activity  PERSONAL FACTORS 1 comorbidity: LBP  are also affecting patient's functional outcome.   REHAB POTENTIAL: Good  CLINICAL DECISION MAKING: Stable/uncomplicated  EVALUATION COMPLEXITY: Low   GOALS: Goals reviewed with patient? Yes  SHORT TERM GOALS: Target date: 12/09/2021  Pt will be indep with initial HEP to assist with LBP management Baseline:issued at eval Goal status: INITIAL  2.  Pt will report improvement in R lumbar/R LE cramping with functional activities x 25% Baseline: 0% Goal status: INITIAL  3.  Pt will report improvement in balance walking in bedroom >/=40% to decrease falls risk Baseline: balance issues daily Goal status: INITIAL    LONG TERM GOALS: Target date: 01/06/2022  Pt will be able to get in/out of bed without R lumbar/R LE cramping 75% of the time Baseline: happens 3x/week  Goal status: INITIAL  2.  Pt will be able to sit >/=45 min with </=3/10 R lumbar/R LE pain to eat dinner/watch TV Baseline: 30 min Goal status: INITIAL  3.  Pt will demo improved Berg >/= 46/56 to decrease falls risk Baseline: 38/56 Goal status: INITIAL  4.  Pt will demo improvement in R hip ext strength to >/=4/5 to assist with transfers Baseline: 3/5 Goal status: INITIAL    PLAN: PT FREQUENCY: 2x/week  PT DURATION: 8 weeks  PLANNED INTERVENTIONS: Therapeutic exercises, Therapeutic activity, Neuromuscular re-education, Balance training, Gait training, Patient/Family education, DME instructions, Dry Needling, Electrical stimulation, Cryotherapy, Moist heat, Taping, Ultrasound, Manual therapy, and Re-evaluation.  PLAN FOR NEXT SESSION: review  HEP, correct pelvic obliquity, MET, stabilization therex, FOTO   Check all possible CPT codes: 10258 - PT Re-evaluation, 97110- Therapeutic Exercise, [phone]- Neuro Re-education, [phone] - Gait Training, [phone] - Manual Therapy, 97530 - Therapeutic Activities, 97014 - Electrical stimulation (unattended), [id number] - Iontophoresis, and [id number] - Ultrasound     If treatment provided at initial evaluation, no treatment charged due to lack of authorization.

## 2021-11-11 ENCOUNTER — Other Ambulatory Visit: Payer: Self-pay

## 2021-11-11 ENCOUNTER — Ambulatory Visit (HOSPITAL_BASED_OUTPATIENT_CLINIC_OR_DEPARTMENT_OTHER): Payer: Medicaid Other | Attending: Nurse Practitioner | Admitting: Rehabilitative and Restorative Service Providers"

## 2021-11-11 ENCOUNTER — Encounter (HOSPITAL_BASED_OUTPATIENT_CLINIC_OR_DEPARTMENT_OTHER): Payer: Self-pay | Admitting: Rehabilitative and Restorative Service Providers"

## 2021-11-11 DIAGNOSIS — M5459 Other low back pain: Secondary | ICD-10-CM | POA: Diagnosis present

## 2021-11-11 DIAGNOSIS — R2681 Unsteadiness on feet: Secondary | ICD-10-CM | POA: Diagnosis present

## 2021-11-11 DIAGNOSIS — M79604 Pain in right leg: Secondary | ICD-10-CM | POA: Diagnosis present

## 2021-11-11 DIAGNOSIS — M6281 Muscle weakness (generalized): Secondary | ICD-10-CM | POA: Diagnosis present

## 2021-11-11 DIAGNOSIS — G894 Chronic pain syndrome: Secondary | ICD-10-CM | POA: Insufficient documentation

## 2021-11-16 ENCOUNTER — Ambulatory Visit
Admission: RE | Admit: 2021-11-16 | Discharge: 2021-11-16 | Disposition: A | Discharge status: Home / Self Care | Payer: Medicaid Other | Source: Ambulatory Visit | Attending: Nurse Practitioner | Admitting: Nurse Practitioner

## 2021-11-16 DIAGNOSIS — Z1231 Encounter for screening mammogram for malignant neoplasm of breast: Secondary | ICD-10-CM

## 2021-11-23 ENCOUNTER — Ambulatory Visit (HOSPITAL_BASED_OUTPATIENT_CLINIC_OR_DEPARTMENT_OTHER): Payer: Medicaid Other | Admitting: Physical Therapy

## 2021-11-23 ENCOUNTER — Telehealth (HOSPITAL_BASED_OUTPATIENT_CLINIC_OR_DEPARTMENT_OTHER): Payer: Self-pay | Admitting: Physical Therapy

## 2021-11-23 ENCOUNTER — Telehealth: Payer: Self-pay | Admitting: Nurse Practitioner

## 2021-11-23 NOTE — Telephone Encounter (Signed)
No-show for appt on 11/23/21; called and left VM about next scheduled PT session.   Ann Lions PT, DPT, PN2   Supplemental Physical Therapist [place]

## 2021-11-23 NOTE — Telephone Encounter (Signed)
..   Medicaid Managed Care   Unsuccessful Outreach Note  11/23/2021 Name: Kendra Griffith MRN: [id number] DOB: 04/10/1966  Referred by: [name], NP Reason for referral : High Risk Managed Medicaid (I called the patient today to get her scheduled with the MM Team. I left my name and number on her VM.)   An unsuccessful telephone outreach was attempted today. The patient was referred to the case management team for assistance with care management and care coordination.   Follow Up Plan: The care management team will reach out to the patient again over the next 14 days.    [place]

## 2021-12-01 ENCOUNTER — Ambulatory Visit (HOSPITAL_BASED_OUTPATIENT_CLINIC_OR_DEPARTMENT_OTHER): Payer: Medicaid Other | Admitting: Physical Therapy

## 2021-12-02 ENCOUNTER — Other Ambulatory Visit: Payer: Self-pay

## 2021-12-02 NOTE — Patient Outreach (Signed)
  Medicaid Managed Care   Unsuccessful Attempt Note   12/02/2021 Name: Kendra Griffith MRN: [id number] DOB: May 14, 1965  Referred by: [name], NP Reason for referral : High Risk Managed Medicaid   An unsuccessful telephone outreach was attempted today. The patient was referred to the case management team for assistance with care management and care coordination.    Follow Up Plan: A HIPAA compliant phone message was left for the patient providing contact information and requesting a return call.   Eula Fried, BSW, MSW, [employer] Managed Medicaid LCSW [email or web address] Phone: [phone]

## 2021-12-02 NOTE — Patient Instructions (Signed)
Kendra Griffith ,   The [facility] Managed Care Team is available to provide assistance to you with your healthcare needs at no cost and as a benefit of your [facility] Health plan. I'm sorry I was unable to reach you today for our scheduled appointment. Our care guide will call you to reschedule our telephone appointment. Please call me at the number below. I am available to be of assistance to you regarding your healthcare needs. .   Thank you,   Eula Fried, BSW, MSW, LCSW Managed Medicaid LCSW [email or web address] Phone: [phone]

## 2021-12-03 ENCOUNTER — Ambulatory Visit (HOSPITAL_BASED_OUTPATIENT_CLINIC_OR_DEPARTMENT_OTHER): Payer: Medicaid Other | Admitting: Physical Therapy

## 2021-12-03 ENCOUNTER — Telehealth: Payer: Self-pay | Admitting: Nurse Practitioner

## 2021-12-03 NOTE — Telephone Encounter (Signed)
..   Medicaid Managed Care   Unsuccessful Outreach Note  12/03/2021 Name: Kendra Griffith MRN: [id number] DOB: [birth year]  Referred by: [name], NP Reason for referral : High Risk Managed Medicaid (I called the patient today to get her rescheduled with the MM Team. I left my name and number on her VM.)   A second unsuccessful telephone outreach was attempted today. The patient was referred to the case management team for assistance with care management and care coordination.   Follow Up Plan: The care management team will reach out to the patient again over the next 7 days.   [place]

## 2021-12-04 NOTE — Therapy (Signed)
OUTPATIENT PHYSICAL THERAPY TREATMENT NOTE   Patient Name: Kendra Griffith MRN: [id number] DOB:10/11/1965, 56 y.o., female Today's Date: 12/07/2021   PT End of Session - 12/06/21 1346     Visit Number 2    Number of Visits 16    Date for PT Re-Evaluation 01/06/22    Authorization Type [place] MCD UHC    PT Start Time 3557    PT Stop Time 1430    PT Time Calculation (min) 45 min    Activity Tolerance Patient tolerated treatment well;No increased pain    Behavior During Therapy [facility] for tasks assessed/performed              Past Medical History:  Diagnosis Date   ABSCESS, TOOTH 08/20/2009   Qualifier: Diagnosis of  By: Jorene Minors, Scott     Anemia    Anxiety    Bipolar disorder ([place])    Blood transfusion without reported diagnosis    childbirth   Chronic pain    feet and back   Clotting disorder ([place])    childbirth   Dental caries    lesion soft palate   Depression    Diabetes mellitus without complication ([place])    prediabetes   GERD (gastroesophageal reflux disease)    Hypertension    Injury    right side from jumping off bunkbed   Pre-diabetes    Sickle cell trait ([place])    Wears glasses    Past Surgical History:  Procedure Laterality Date   BARTHOLIN CYST MARSUPIALIZATION N/A 07/16/2019   Procedure: EXCISION OF BARTHOLIN CYST;  Surgeon: Chancy Milroy, MD;  Location: [place];  Service: Gynecology;  Laterality: N/A;   CESAREAN SECTION     three   COLONOSCOPY     cyst removal 1997     FACIAL RECONSTRUCTION SURGERY     FOOT SURGERY Bilateral    LESION REMOVAL Left 10/15/2018   Procedure: Lesion Removal left soft palate;  Surgeon: Diona Browner, DDS;  Location: [place];  Service: Oral Surgery;  Laterality: Left;   TOOTH EXTRACTION Bilateral 10/15/2018   Procedure: DENTAL EXTRACTIONS OF TEETH NUMBER TWO, FOUR, FIVE, SIX, SEVEN, EIGHT, NINE, TEN, ELEVEN, TWELVE, THIRTEEN, FOURTEEN, SEVENTEEN, TWENTY-ONE, TWENTY-TWO, TWENTY-THREE, TWENTY-FOUR,  TWENTY-FIVE, TWENTY-SIX, TWENTY-SEVEN, TWENTY-EIGHT WITH ALVEOLOPLASTY;  Surgeon: Diona Browner, DDS;  Location: [place];  Service: Oral Surgery;  Laterality: Bilateral;   TUBAL LIGATION     Patient Active Problem List   Diagnosis Date Noted   Post-operative state 32/20/2542   Alcoholic peripheral neuropathy ([place]) 06/09/2017   Tobacco use 06/09/2017   Breast cancer screening 01/21/2016   Chronic pain syndrome 09/25/2014   Type 2 diabetes mellitus with diabetic polyneuropathy, without long-term current use of insulin ([place]) 06/30/2014   Peripheral neuropathy 05/09/2013   Poor dentition 02/01/2013   Pain in joint, ankle and foot 02/01/2013   Essential hypertension, benign 02/01/2013   FOOT PAIN, BILATERAL 06/16/2010   RECTAL BLEEDING 12/08/2009   HEMATURIA UNSPECIFIED 12/08/2009   DEPRESSION 08/20/2009   Mixed hyperlipidemia 07/30/2009   ANEMIA-NOS 07/30/2009   GERD 07/30/2009   CALLUS, FOOT 07/30/2009    PCP: [name], NP  REFERRING PROVIDER: [name], NP  REFERRING DIAG: G89.4 (ICD-10-CM) - Chronic pain syndrome  Rationale for Evaluation and Treatment Rehabilitation  THERAPY DIAG:  Other low back pain  Muscle weakness (generalized)  Pain in right leg  Unsteadiness on feet  ONSET DATE: 3 years  SUBJECTIVE:  SUBJECTIVE STATEMENT: Pt states she is having a lot of R LE spasms and lumbar spasms and "sometimes they are happening together".  Pt denies any adverse effects after prior Rx.  Pt reports she has not been performing HEP.  Pt states she has neuropathy in bilat feet.  Pt states she took her pain medication prior to Rx.    PERTINENT HISTORY:  Lower back muscle spasms R side and have Right leg cramps from the calf and all the way down. Pt reports dehydration. Smoker. Had PT  before 2020 or 2021.  Neuropathy in bilat feet  PAIN:  Are you having pain? Yes: NPRS scale: 4/10 Pain location: R lumbar Pain description: cramping, sharpness Aggravating factors: leg elevation sometimes, supine positioning Relieving factors: stretching   PRECAUTIONS: fall risk  WEIGHT BEARING RESTRICTIONS No  FALLS:  Has patient fallen in last 6 months? Yes. Number of falls 2-catches self  LIVING ENVIRONMENT: Lives with: lives with their family Lives in: House/apartment Has following equipment at home: Single point cane and Environmental consultant - 4 wheeled  OCCUPATION: not working  PLOF: Independent  PATIENT GOALS to not have pain   OBJECTIVE:   DIAGNOSTIC FINDINGS:  08/25/17 XR: L5-S1 disc degeneration.  Lower lumbar facet degeneration  PATIENT SURVEYS:  Modified Oswestry:  52%    TODAY'S TREATMENT  Reviewed current function, pain level, response to prior Rx, and HEP compliance.  Reviewed HEP. Pt completed Oswestry. Pt performed:  Supine single Knee to chest 2x30 sec bilat  Supine piriformis stretch 2x20-30 sec bilat  Supine HS stretch 2x20-30 sec bilat  Lumbar rotation 2x10  Supine TrA contraction with 5 sec hold  PPT  Supine bridge approx 10 reps  See below for Pt education.  PT updated HEP and gave pt a HEP handout.  Educated pt in correct form and appropriate frequency.   PATIENT EDUCATION:  Education details: Reviewed HEP.  Exercise form.  Educated pt in correct performance and palpation of TrA contraction.  PT answered pt's questions.  log roll technique Person educated: Patient Education method: Explanation, Demonstration, and Handouts Education comprehension: verbalized understanding and returned demonstration   HOME EXERCISE PROGRAM: Access Code: [email or web address] URL: [email or web address] Date: 11/11/2021 Prepared by: America Brown  Exercises - Supine Single Knee to Chest Stretch  - 2 x daily - 7 x weekly - 1 sets - 2-3 reps - 30 sec hold -  Supine Piriformis Stretch with Leg Straight  - 2 x daily - 7 x weekly - 1 sets - 2-3 reps - 30 sec hold - Supine Hamstring Stretch  - 2 x daily - 7 x weekly - 1 sets - 2-3 reps - 30 sec hold  Updated HEP: Access Code: [email or web address] URL: [email or web address] Date: 12/06/2021 Prepared by: Ronny Flurry  Exercises - Supine Transversus Abdominis Bracing - Hands on Stomach  - 2 x daily - 7 x weekly - 2 sets - 10 reps - 5 seconds hold - Hooklying Lumbar Rotation  - 2 x daily - 7 x weekly - 2 sets - 10 reps   ASSESSMENT:  CLINICAL IMPRESSION: Pt reports she has not been performing HEP.  PT reviewed HEP and pt performed in the clinic.  HEP was updated.  PT encouraged pt to perform HEP.  She had much difficulty with performing PPT.  Pt was limited with height of bridge.  Pt received instruction and cuing for correct form with exercises.  She responded well to Rx reporting improved lumbar pain from 4/10 before Rx to 0/10 after Rx.  Pt should benefit from cont skilled PT services to address goals and impairments and to improve overall function.    OBJECTIVE IMPAIRMENTS Abnormal gait, decreased activity tolerance, decreased balance, decreased mobility, difficulty walking, decreased strength, hypomobility, increased muscle spasms, impaired flexibility, postural dysfunction, and pain.   ACTIVITY LIMITATIONS lifting, bending, sitting, standing, squatting, transfers, bed mobility, and locomotion level  PARTICIPATION LIMITATIONS: meal prep, cleaning, and community activity  PERSONAL FACTORS 1 comorbidity: LBP  are also affecting patient's functional outcome.   REHAB POTENTIAL: Good  CLINICAL DECISION MAKING: Stable/uncomplicated  EVALUATION COMPLEXITY: Low   GOALS: Goals reviewed with patient? Yes  SHORT TERM GOALS: Target date: 01/04/2022  Pt will be indep with initial HEP to assist with LBP management Baseline:issued at eval Goal status: INITIAL  2.  Pt will report improvement  in R lumbar/R LE cramping with functional activities x 25% Baseline: 0% Goal status: INITIAL  3.  Pt will report improvement in balance walking in bedroom >/=40% to decrease falls risk Baseline: balance issues daily Goal status: INITIAL    LONG TERM GOALS: Target date: 02/01/2022  Pt will be able to get in/out of bed without R lumbar/R LE cramping 75% of the time Baseline: happens 3x/week  Goal status: INITIAL  2.  Pt will be able to sit >/=45 min with </=3/10 R lumbar/R LE pain to eat dinner/watch TV Baseline: 30 min Goal status: INITIAL  3.  Pt will demo improved Berg >/= 46/56 to decrease falls risk Baseline: 38/56 Goal status: INITIAL  4.  Pt will demo improvement in R hip ext strength to >/=4/5 to assist with transfers Baseline: 3/5 Goal status: INITIAL    PLAN: PT FREQUENCY: 2x/week  PT DURATION: 8 weeks  PLANNED INTERVENTIONS: Therapeutic exercises, Therapeutic activity, Neuromuscular re-education, Balance training, Gait training, Patient/Family education, DME instructions, Dry Needling, Electrical stimulation, Cryotherapy, Moist heat, Taping, Ultrasound, Manual therapy, and Re-evaluation.  PLAN FOR NEXT SESSION:  Cont with core strengthening, lumbar mobility, and LE/hip flexibility.   Selinda Michaels III PT, DPT 12/07/21 10:20 PM

## 2021-12-06 ENCOUNTER — Encounter (HOSPITAL_BASED_OUTPATIENT_CLINIC_OR_DEPARTMENT_OTHER): Payer: Self-pay | Admitting: Physical Therapy

## 2021-12-06 ENCOUNTER — Ambulatory Visit (HOSPITAL_BASED_OUTPATIENT_CLINIC_OR_DEPARTMENT_OTHER): Payer: Medicaid Other | Admitting: Physical Therapy

## 2021-12-06 DIAGNOSIS — R2681 Unsteadiness on feet: Secondary | ICD-10-CM

## 2021-12-06 DIAGNOSIS — M5459 Other low back pain: Secondary | ICD-10-CM

## 2021-12-06 DIAGNOSIS — M6281 Muscle weakness (generalized): Secondary | ICD-10-CM

## 2021-12-06 DIAGNOSIS — M79604 Pain in right leg: Secondary | ICD-10-CM

## 2021-12-10 ENCOUNTER — Encounter (HOSPITAL_BASED_OUTPATIENT_CLINIC_OR_DEPARTMENT_OTHER): Payer: Self-pay

## 2021-12-10 ENCOUNTER — Ambulatory Visit (HOSPITAL_BASED_OUTPATIENT_CLINIC_OR_DEPARTMENT_OTHER): Payer: Medicaid Other | Admitting: Physical Therapy

## 2021-12-14 ENCOUNTER — Encounter (HOSPITAL_BASED_OUTPATIENT_CLINIC_OR_DEPARTMENT_OTHER): Payer: Self-pay | Admitting: Physical Therapy

## 2021-12-14 ENCOUNTER — Ambulatory Visit (HOSPITAL_BASED_OUTPATIENT_CLINIC_OR_DEPARTMENT_OTHER): Payer: Medicaid Other | Attending: Nurse Practitioner | Admitting: Physical Therapy

## 2021-12-14 DIAGNOSIS — M5459 Other low back pain: Secondary | ICD-10-CM | POA: Diagnosis present

## 2021-12-14 DIAGNOSIS — R2681 Unsteadiness on feet: Secondary | ICD-10-CM | POA: Insufficient documentation

## 2021-12-14 DIAGNOSIS — M6281 Muscle weakness (generalized): Secondary | ICD-10-CM | POA: Insufficient documentation

## 2021-12-14 DIAGNOSIS — M79604 Pain in right leg: Secondary | ICD-10-CM | POA: Diagnosis present

## 2021-12-14 NOTE — Therapy (Signed)
OUTPATIENT PHYSICAL THERAPY TREATMENT NOTE   Patient Name: Kendra Griffith MRN: [id number] DOB:02-24-1966, 56 y.o., female Today's Date: 12/14/2021   PT End of Session - 12/14/21 1200     Visit Number 3    Number of Visits 16    Date for PT Re-Evaluation 01/06/22    Authorization Type [place] [employer]    PT Start Time 1152    PT Stop Time 1231    PT Time Calculation (min) 39 min    Activity Tolerance Patient tolerated treatment well;No increased pain    Behavior During Therapy [facility] for tasks assessed/performed              Past Medical History:  Diagnosis Date   ABSCESS, TOOTH 08/20/2009   Qualifier: Diagnosis of  By: Jorene Minors, Scott     Anemia    Anxiety    Bipolar disorder ([place])    Blood transfusion without reported diagnosis    childbirth   Chronic pain    feet and back   Clotting disorder ([place])    childbirth   Dental caries    lesion soft palate   Depression    Diabetes mellitus without complication ([place])    prediabetes   GERD (gastroesophageal reflux disease)    Hypertension    Injury    right side from jumping off bunkbed   Pre-diabetes    Sickle cell trait ([place])    Wears glasses    Past Surgical History:  Procedure Laterality Date   BARTHOLIN CYST MARSUPIALIZATION N/A 07/16/2019   Procedure: EXCISION OF BARTHOLIN CYST;  Surgeon: Chancy Milroy, MD;  Location: [place];  Service: Gynecology;  Laterality: N/A;   CESAREAN SECTION     three   COLONOSCOPY     cyst removal 1997     FACIAL RECONSTRUCTION SURGERY     FOOT SURGERY Bilateral    LESION REMOVAL Left 10/15/2018   Procedure: Lesion Removal left soft palate;  Surgeon: Diona Browner, DDS;  Location: [place];  Service: Oral Surgery;  Laterality: Left;   TOOTH EXTRACTION Bilateral 10/15/2018   Procedure: DENTAL EXTRACTIONS OF TEETH NUMBER TWO, FOUR, FIVE, SIX, SEVEN, EIGHT, NINE, TEN, ELEVEN, TWELVE, THIRTEEN, FOURTEEN, SEVENTEEN, TWENTY-ONE, TWENTY-TWO, TWENTY-THREE, TWENTY-FOUR,  TWENTY-FIVE, TWENTY-SIX, TWENTY-SEVEN, TWENTY-EIGHT WITH ALVEOLOPLASTY;  Surgeon: Diona Browner, DDS;  Location: [place];  Service: Oral Surgery;  Laterality: Bilateral;   TUBAL LIGATION     Patient Active Problem List   Diagnosis Date Noted   Post-operative state 10/09/1599   Alcoholic peripheral neuropathy ([place]) 06/09/2017   Tobacco use 06/09/2017   Breast cancer screening 01/21/2016   Chronic pain syndrome 09/25/2014   Type 2 diabetes mellitus with diabetic polyneuropathy, without long-term current use of insulin ([place]) 06/30/2014   Peripheral neuropathy 05/09/2013   Poor dentition 02/01/2013   Pain in joint, ankle and foot 02/01/2013   Essential hypertension, benign 02/01/2013   FOOT PAIN, BILATERAL 06/16/2010   RECTAL BLEEDING 12/08/2009   HEMATURIA UNSPECIFIED 12/08/2009   DEPRESSION 08/20/2009   Mixed hyperlipidemia 07/30/2009   ANEMIA-NOS 07/30/2009   GERD 07/30/2009   CALLUS, FOOT 07/30/2009    PCP: [name], NP  REFERRING PROVIDER: [name], NP  REFERRING DIAG: G89.4 (ICD-10-CM) - Chronic pain syndrome  Rationale for Evaluation and Treatment Rehabilitation  THERAPY DIAG:  Other low back pain  Muscle weakness (generalized)  Pain in right leg  Unsteadiness on feet  ONSET DATE: 3 years  SUBJECTIVE:  SUBJECTIVE STATEMENT: Pt reports she has only performed HEP twice since last Rx.  Pt hasn't been having spasms, just pain.  Pt denies any adverse effects after prior Rx.  Pt states she needs to change her mattress and thinks a lot of her pain at night is coming from her bed.  Pt states she can't thinks of any specific improvements.    PERTINENT HISTORY:  Lower back muscle spasms R side and have Right leg cramps from the calf and all the way down. Pt reports dehydration.  Smoker. Had PT before 2020 or 2021.  Neuropathy in bilat feet  PAIN:  Are you having pain? Yes: NPRS scale: 4/10 Pain location: R sided thoracic Pain description: cramping, sharpness Aggravating factors: leg elevation sometimes, supine positioning Relieving factors: stretching   PRECAUTIONS: fall risk  WEIGHT BEARING RESTRICTIONS No  FALLS:  Has patient fallen in last 6 months? Yes. Number of falls 2-catches self  LIVING ENVIRONMENT: Lives with: lives with their family Lives in: House/apartment Has following equipment at home: Single point cane and Environmental consultant - 4 wheeled  OCCUPATION: not working  PLOF: Independent  PATIENT GOALS to not have pain   OBJECTIVE:   DIAGNOSTIC FINDINGS:  08/25/17 XR: L5-S1 disc degeneration.  Lower lumbar facet degeneration   TODAY'S TREATMENT  Reviewed pain level, response to prior Rx, and HEP compliance.  Reviewed HEP. Pt performed:  Supine single Knee to chest 2x30 sec bilat  Supine piriformis stretch 2x20-30 sec bilat  Supine HS stretch 2x20-30 sec bilat  Supine TrA contraction with 5 sec hold x 10 reps  Supine marching with TrA 2x10 reps  Supine alt LE ext with TrA 2x10 reps  PPT 2x10  Supine bridge 2 x 10 reps    Lumbar rotation 2x10    Standing rows with retraction with TrA with RTB 2x10    Supine manual HS stretch 2x20-30 sec  See below for Pt education.    PATIENT EDUCATION:  Education details: Reviewed HEP and encouraged pt to be compliant with HEP.  Exercise form.  Educated pt in correct performance and palpation of TrA contraction.  PT answered pt's questions.  Person educated: Patient Education method: Explanation, Demonstration, verbal and tactile cues Education comprehension: verbalized understanding and returned demonstration   HOME EXERCISE PROGRAM: Access Code: [email or web address] URL: [email or web address] Date: 11/11/2021 Prepared by: America Brown  Exercises - Supine Single Knee to Chest Stretch  - 2 x  daily - 7 x weekly - 1 sets - 2-3 reps - 30 sec hold - Supine Piriformis Stretch with Leg Straight  - 2 x daily - 7 x weekly - 1 sets - 2-3 reps - 30 sec hold - Supine Hamstring Stretch  - 2 x daily - 7 x weekly - 1 sets - 2-3 reps - 30 sec hold  Updated HEP: Access Code: [email or web address] URL: [email or web address] Date: 12/06/2021 Prepared by: Ronny Flurry  Exercises - Supine Transversus Abdominis Bracing - Hands on Stomach  - 2 x daily - 7 x weekly - 2 sets - 10 reps - 5 seconds hold - Hooklying Lumbar Rotation  - 2 x daily - 7 x weekly - 2 sets - 10 reps   ASSESSMENT:  CLINICAL IMPRESSION: Pt has only performed her HEP twice since last Rx.  PT reviewed HEP and pt performed in the clinic.  She required cuing for correct form with core exercises and demonstrates improved form with cuing.  She had much improved form with PPT.  Pt was limited with height of  bridge.  Pt has tightness in bilat HS.  She responded well to Rx reporting improved lumbar pain from 4/10 before Rx to 0/10 after Rx though did report having a "catch" in her R sided lower lumbar. Pt should benefit from cont skilled PT services to address goals and impairments and to improve overall function.    OBJECTIVE IMPAIRMENTS Abnormal gait, decreased activity tolerance, decreased balance, decreased mobility, difficulty walking, decreased strength, hypomobility, increased muscle spasms, impaired flexibility, postural dysfunction, and pain.   ACTIVITY LIMITATIONS lifting, bending, sitting, standing, squatting, transfers, bed mobility, and locomotion level  PARTICIPATION LIMITATIONS: meal prep, cleaning, and community activity  PERSONAL FACTORS 1 comorbidity: LBP  are also affecting patient's functional outcome.   REHAB POTENTIAL: Good  CLINICAL DECISION MAKING: Stable/uncomplicated  EVALUATION COMPLEXITY: Low   GOALS: Goals reviewed with patient? Yes  SHORT TERM GOALS: Target date: 01/11/2022  Pt will be indep  with initial HEP to assist with LBP management Baseline:issued at eval Goal status: INITIAL  2.  Pt will report improvement in R lumbar/R LE cramping with functional activities x 25% Baseline: 0% Goal status: INITIAL  3.  Pt will report improvement in balance walking in bedroom >/=40% to decrease falls risk Baseline: balance issues daily Goal status: INITIAL    LONG TERM GOALS: Target date: 02/08/2022  Pt will be able to get in/out of bed without R lumbar/R LE cramping 75% of the time Baseline: happens 3x/week  Goal status: INITIAL  2.  Pt will be able to sit >/=45 min with </=3/10 R lumbar/R LE pain to eat dinner/watch TV Baseline: 30 min Goal status: INITIAL  3.  Pt will demo improved Berg >/= 46/56 to decrease falls risk Baseline: 38/56 Goal status: INITIAL  4.  Pt will demo improvement in R hip ext strength to >/=4/5 to assist with transfers Baseline: 3/5 Goal status: INITIAL    PLAN: PT FREQUENCY: 2x/week  PT DURATION: 8 weeks  PLANNED INTERVENTIONS: Therapeutic exercises, Therapeutic activity, Neuromuscular re-education, Balance training, Gait training, Patient/Family education, DME instructions, Dry Needling, Electrical stimulation, Cryotherapy, Moist heat, Taping, Ultrasound, Manual therapy, and Re-evaluation.  PLAN FOR NEXT SESSION:  Cont with core strengthening, lumbar mobility, and LE/hip flexibility.   Selinda Michaels III PT, DPT 12/14/21 3:19 PM

## 2021-12-15 ENCOUNTER — Other Ambulatory Visit: Payer: Self-pay | Admitting: Licensed Clinical Social Worker

## 2021-12-15 NOTE — Patient Instructions (Signed)
Kendra Griffith ,   The [facility] Managed Care Team is available to provide assistance to you with your healthcare needs at no cost and as a benefit of your [facility] Health plan. I'm sorry I was unable to reach you today for our scheduled appointment. Our care guide will call you to reschedule our telephone appointment. Please call me at the number below. I am available to be of assistance to you regarding your healthcare needs. .   Thank you,   Eula Fried, BSW, MSW, LCSW Managed Medicaid LCSW [email or web address] Phone: [phone]

## 2021-12-15 NOTE — Patient Outreach (Addendum)
  Medicaid Managed Care   Unsuccessful Attempt Note   12/15/2021 Name: Kendra Griffith MRN: [id number] DOB: [birth year]  Referred by: [name], NP Reason for referral : High Risk Managed Medicaid   A second unsuccessful telephone outreach was attempted today. The patient was referred to the case management team for assistance with care management and care coordination.    Follow Up Plan: A HIPAA compliant phone message was left for the patient providing contact information and requesting a return call.   Eula Fried, BSW, MSW, [employer] Managed Medicaid LCSW [email or web address] Phone: [phone]

## 2021-12-17 ENCOUNTER — Ambulatory Visit (HOSPITAL_BASED_OUTPATIENT_CLINIC_OR_DEPARTMENT_OTHER): Payer: Medicaid Other | Admitting: Physical Therapy

## 2021-12-17 ENCOUNTER — Encounter (HOSPITAL_BASED_OUTPATIENT_CLINIC_OR_DEPARTMENT_OTHER): Payer: Self-pay | Admitting: Physical Therapy

## 2021-12-17 DIAGNOSIS — M5459 Other low back pain: Secondary | ICD-10-CM | POA: Diagnosis not present

## 2021-12-17 DIAGNOSIS — M6281 Muscle weakness (generalized): Secondary | ICD-10-CM

## 2021-12-17 DIAGNOSIS — M79604 Pain in right leg: Secondary | ICD-10-CM

## 2021-12-17 DIAGNOSIS — R2681 Unsteadiness on feet: Secondary | ICD-10-CM

## 2021-12-17 NOTE — Therapy (Signed)
OUTPATIENT PHYSICAL THERAPY TREATMENT NOTE   Patient Name: Kendra Griffith MRN: [id number] DOB:11/01/1965, 56 y.o., female Today's Date: 12/17/2021   PT End of Session - 12/17/21 1324     Visit Number 4    Number of Visits 16    Authorization Type [place] [employer] UHC    PT Start Time 7824    PT Stop Time 2353    PT Time Calculation (min) 38 min    Activity Tolerance Patient tolerated treatment well;No increased pain    Behavior During Therapy [facility] for tasks assessed/performed              Past Medical History:  Diagnosis Date   ABSCESS, TOOTH 08/20/2009   Qualifier: Diagnosis of  By: Jorene Minors, Scott     Anemia    Anxiety    Bipolar disorder ([place])    Blood transfusion without reported diagnosis    childbirth   Chronic pain    feet and back   Clotting disorder ([place])    childbirth   Dental caries    lesion soft palate   Depression    Diabetes mellitus without complication ([place])    prediabetes   GERD (gastroesophageal reflux disease)    Hypertension    Injury    right side from jumping off bunkbed   Pre-diabetes    Sickle cell trait ([place])    Wears glasses    Past Surgical History:  Procedure Laterality Date   BARTHOLIN CYST MARSUPIALIZATION N/A 07/16/2019   Procedure: EXCISION OF BARTHOLIN CYST;  Surgeon: Chancy Milroy, MD;  Location: [place];  Service: Gynecology;  Laterality: N/A;   CESAREAN SECTION     three   COLONOSCOPY     cyst removal 1997     FACIAL RECONSTRUCTION SURGERY     FOOT SURGERY Bilateral    LESION REMOVAL Left 10/15/2018   Procedure: Lesion Removal left soft palate;  Surgeon: Diona Browner, DDS;  Location: [place];  Service: Oral Surgery;  Laterality: Left;   TOOTH EXTRACTION Bilateral 10/15/2018   Procedure: DENTAL EXTRACTIONS OF TEETH NUMBER TWO, FOUR, FIVE, SIX, SEVEN, EIGHT, NINE, TEN, ELEVEN, TWELVE, THIRTEEN, FOURTEEN, SEVENTEEN, TWENTY-ONE, TWENTY-TWO, TWENTY-THREE, TWENTY-FOUR, TWENTY-FIVE, TWENTY-SIX, TWENTY-SEVEN,  TWENTY-EIGHT WITH ALVEOLOPLASTY;  Surgeon: Diona Browner, DDS;  Location: [place];  Service: Oral Surgery;  Laterality: Bilateral;   TUBAL LIGATION     Patient Active Problem List   Diagnosis Date Noted   Post-operative state 61/44/3154   Alcoholic peripheral neuropathy ([place]) 06/09/2017   Tobacco use 06/09/2017   Breast cancer screening 01/21/2016   Chronic pain syndrome 09/25/2014   Type 2 diabetes mellitus with diabetic polyneuropathy, without long-term current use of insulin ([place]) 06/30/2014   Peripheral neuropathy 05/09/2013   Poor dentition 02/01/2013   Pain in joint, ankle and foot 02/01/2013   Essential hypertension, benign 02/01/2013   FOOT PAIN, BILATERAL 06/16/2010   RECTAL BLEEDING 12/08/2009   HEMATURIA UNSPECIFIED 12/08/2009   DEPRESSION 08/20/2009   Mixed hyperlipidemia 07/30/2009   ANEMIA-NOS 07/30/2009   GERD 07/30/2009   CALLUS, FOOT 07/30/2009    PCP: [name], NP  REFERRING PROVIDER: [name], NP  REFERRING DIAG: G89.4 (ICD-10-CM) - Chronic pain syndrome  Rationale for Evaluation and Treatment Rehabilitation  THERAPY DIAG:  Other low back pain  Muscle weakness (generalized)  Pain in right leg  Unsteadiness on feet  ONSET DATE: 3 years  SUBJECTIVE:  SUBJECTIVE STATEMENT: Pt reports she has been performing HEP.  Pt hasn't been having spasms.  Pt denies pain currently.  Her feet are bothering her; she has neuropathy.  Pt states she felt good after prior Rx.  Pt states she didn't sleep at all last night.  She states her feet and back were bothering her last night, but she also has insomnia.  Pt reports improved pain in back and legs overall, but doesn't reports any specific functional improvements.  Pt states she has always had balance issues with bending and  reaching.    PERTINENT HISTORY:  Lower back muscle spasms R side and have Right leg cramps from the calf and all the way down. Pt reports dehydration. Smoker. Had PT before 2020 or 2021.  Neuropathy in bilat feet  PAIN:  Are you having pain? Yes: NPRS scale: 4/10 Pain location: R sided thoracic Pain description: cramping, sharpness Aggravating factors: leg elevation sometimes, supine positioning Relieving factors: stretching   PRECAUTIONS: fall risk  WEIGHT BEARING RESTRICTIONS No  FALLS:  Has patient fallen in last 6 months? Yes. Number of falls 2-catches self  LIVING ENVIRONMENT: Lives with: lives with their family Lives in: House/apartment Has following equipment at home: Single point cane and Environmental consultant - 4 wheeled  OCCUPATION: not working  PLOF: Independent  PATIENT GOALS to not have pain   OBJECTIVE:   DIAGNOSTIC FINDINGS:  08/25/17 XR: L5-S1 disc degeneration.  Lower lumbar facet degeneration   TODAY'S TREATMENT  Reviewed pain level, response to prior Rx, and HEP compliance.  Reviewed HEP. Pt performed:  Supine single Knee to chest 2x30 sec bilat  Supine TrA contraction with 5 sec hold x 10 reps  Supine marching with TrA 2x10 reps  Supine alt LE ext with TrA 2x10 reps  PPT x10  Supine SLR with PPT x 10 reps  Supine bridge 2 x 10 reps    Lumbar rotation 2x10    Standing rows with retraction with TrA with RTB 2x10    Supine manual HS stretch 2x20-30 sec    Seated HS stretch 2x20-30 sec bilat   PATIENT EDUCATION:  Education details:  HEP and Exercise form.  Educated pt in correct performance and palpation of TrA contraction.  PT answered pt's questions.  Person educated: Patient Education method: Explanation, Demonstration, verbal and tactile cues Education comprehension: verbalized understanding and returned demonstration   HOME EXERCISE PROGRAM: Access Code: [email or web address] URL: [email or web address] Date: 11/11/2021 Prepared by: America Brown  Exercises - Supine Single Knee to Chest Stretch  - 2 x daily - 7 x weekly - 1 sets - 2-3 reps - 30 sec hold - Supine Piriformis Stretch with Leg Straight  - 2 x daily - 7 x weekly - 1 sets - 2-3 reps - 30 sec hold - Supine Hamstring Stretch  - 2 x daily - 7 x weekly - 1 sets - 2-3 reps - 30 sec hold  Updated HEP: Access Code: [email or web address] URL: [email or web address] Date: 12/06/2021 Prepared by: Ronny Flurry  Exercises - Supine Transversus Abdominis Bracing - Hands on Stomach  - 2 x daily - 7 x weekly - 2 sets - 10 reps - 5 seconds hold - Hooklying Lumbar Rotation  - 2 x daily - 7 x weekly - 2 sets - 10 reps   ASSESSMENT:  CLINICAL IMPRESSION: Pt reports improved pain in back and legs overall, but doesn't reports any specific functional improvements.  Pt is improving with core strength as evidenced by performance of exercises.  She  demonstrates improved form with PPT and TrA contraction.  She continues to be limited with height of bridge and requires cuing to increase height.  She responded well to Rx having no pain just a little tightness after Rx.  She should benefit from cont skilled PT services to address goals and impairments and to improve overall function.   OBJECTIVE IMPAIRMENTS Abnormal gait, decreased activity tolerance, decreased balance, decreased mobility, difficulty walking, decreased strength, hypomobility, increased muscle spasms, impaired flexibility, postural dysfunction, and pain.   ACTIVITY LIMITATIONS lifting, bending, sitting, standing, squatting, transfers, bed mobility, and locomotion level  PARTICIPATION LIMITATIONS: meal prep, cleaning, and community activity  PERSONAL FACTORS 1 comorbidity: LBP  are also affecting patient's functional outcome.   REHAB POTENTIAL: Good  CLINICAL DECISION MAKING: Stable/uncomplicated  EVALUATION COMPLEXITY: Low   GOALS: Goals reviewed with patient? Yes  SHORT TERM GOALS: Target date: 01/14/2022  Pt  will be indep with initial HEP to assist with LBP management Baseline:issued at eval Goal status: INITIAL  2.  Pt will report improvement in R lumbar/R LE cramping with functional activities x 25% Baseline: 0% Goal status: INITIAL  3.  Pt will report improvement in balance walking in bedroom >/=40% to decrease falls risk Baseline: balance issues daily Goal status: INITIAL    LONG TERM GOALS: Target date: 02/11/2022  Pt will be able to get in/out of bed without R lumbar/R LE cramping 75% of the time Baseline: happens 3x/week  Goal status: INITIAL  2.  Pt will be able to sit >/=45 min with </=3/10 R lumbar/R LE pain to eat dinner/watch TV Baseline: 30 min Goal status: INITIAL  3.  Pt will demo improved Berg >/= 46/56 to decrease falls risk Baseline: 38/56 Goal status: INITIAL  4.  Pt will demo improvement in R hip ext strength to >/=4/5 to assist with transfers Baseline: 3/5 Goal status: INITIAL    PLAN: PT FREQUENCY: 2x/week  PT DURATION: 8 weeks  PLANNED INTERVENTIONS: Therapeutic exercises, Therapeutic activity, Neuromuscular re-education, Balance training, Gait training, Patient/Family education, DME instructions, Dry Needling, Electrical stimulation, Cryotherapy, Moist heat, Taping, Ultrasound, Manual therapy, and Re-evaluation.  PLAN FOR NEXT SESSION:  Cont with core strengthening, lumbar mobility, and LE/hip flexibility.   Selinda Michaels III PT, DPT 12/17/21 3:46 PM

## 2021-12-19 NOTE — Therapy (Signed)
OUTPATIENT PHYSICAL THERAPY TREATMENT NOTE   Patient Name: Kendra Griffith MRN: [id number] DOB:31-Mar-1966, 56 y.o., female Today's Date: 12/21/2021   PT End of Session - 12/20/21 1200     Visit Number 5    Number of Visits 16    Date for PT Re-Evaluation 01/06/22    Authorization Type  M[employer]    PT Start Time 1157    PT Stop Time 0737    PT Time Calculation (min) 39 min    Activity Tolerance Patient tolerated treatment well;No increased pain    Behavior During Therapy [facility] for tasks assessed/performed               Past Medical History:  Diagnosis Date   ABSCESS, TOOTH 08/20/2009   Qualifier: Diagnosis of  By: Jorene Minors, Scott     Anemia    Anxiety    Bipolar disorder ([place])    Blood transfusion without reported diagnosis    childbirth   Chronic pain    feet and back   Clotting disorder ([place])    childbirth   Dental caries    lesion soft palate   Depression    Diabetes mellitus without complication ([place])    prediabetes   GERD (gastroesophageal reflux disease)    Hypertension    Injury    right side from jumping off bunkbed   Pre-diabetes    Sickle cell trait ([place])    Wears glasses    Past Surgical History:  Procedure Laterality Date   BARTHOLIN CYST MARSUPIALIZATION N/A 07/16/2019   Procedure: EXCISION OF BARTHOLIN CYST;  Surgeon: Chancy Milroy, MD;  Location: [place];  Service: Gynecology;  Laterality: N/A;   CESAREAN SECTION     three   COLONOSCOPY     cyst removal 1997     FACIAL RECONSTRUCTION SURGERY     FOOT SURGERY Bilateral    LESION REMOVAL Left 10/15/2018   Procedure: Lesion Removal left soft palate;  Surgeon: Diona Browner, DDS;  Location: [place];  Service: Oral Surgery;  Laterality: Left;   TOOTH EXTRACTION Bilateral 10/15/2018   Procedure: DENTAL EXTRACTIONS OF TEETH NUMBER TWO, FOUR, FIVE, SIX, SEVEN, EIGHT, NINE, TEN, ELEVEN, TWELVE, THIRTEEN, FOURTEEN, SEVENTEEN, TWENTY-ONE, TWENTY-TWO, TWENTY-THREE, TWENTY-FOUR,  TWENTY-FIVE, TWENTY-SIX, TWENTY-SEVEN, TWENTY-EIGHT WITH ALVEOLOPLASTY;  Surgeon: Diona Browner, DDS;  Location: [place];  Service: Oral Surgery;  Laterality: Bilateral;   TUBAL LIGATION     Patient Active Problem List   Diagnosis Date Noted   Post-operative state 10/62/6948   Alcoholic peripheral neuropathy ([place]) 06/09/2017   Tobacco use 06/09/2017   Breast cancer screening 01/21/2016   Chronic pain syndrome 09/25/2014   Type 2 diabetes mellitus with diabetic polyneuropathy, without long-term current use of insulin ([place]) 06/30/2014   Peripheral neuropathy 05/09/2013   Poor dentition 02/01/2013   Pain in joint, ankle and foot 02/01/2013   Essential hypertension, benign 02/01/2013   FOOT PAIN, BILATERAL 06/16/2010   RECTAL BLEEDING 12/08/2009   HEMATURIA UNSPECIFIED 12/08/2009   DEPRESSION 08/20/2009   Mixed hyperlipidemia 07/30/2009   ANEMIA-NOS 07/30/2009   GERD 07/30/2009   CALLUS, FOOT 07/30/2009    PCP: [name], NP  REFERRING PROVIDER: [name], NP  REFERRING DIAG: G89.4 (ICD-10-CM) - Chronic pain syndrome  Rationale for Evaluation and Treatment Rehabilitation  THERAPY DIAG:  Other low back pain  Muscle weakness (generalized)  Pain in right leg  Unsteadiness on feet  ONSET DATE: 3 years  SUBJECTIVE:  SUBJECTIVE STATEMENT: Pt reports she has been performing HEP.  Pt hasn't been having spasms or cramps.  Pt denies pain currently.  Pt states she felt good after prior Rx.  Pt states she didn't sleep last night and she has insomnia.  Pt reports improved pain in back and legs overall.  Pt states she has always had balance issues with bending and reaching.    PERTINENT HISTORY:  Lower back muscle spasms R side and have Right leg cramps from the calf and all the way down. Pt  reports dehydration. Smoker. Had PT before 2020 or 2021.  Neuropathy in bilat feet  PAIN:  Are you having pain? Yes: NPRS scale: 0/10 Pain location: R sided thoracic Pain description: cramping, sharpness Aggravating factors: leg elevation sometimes, supine positioning Relieving factors: stretching   PRECAUTIONS: fall risk  WEIGHT BEARING RESTRICTIONS No  FALLS:  Has patient fallen in last 6 months? Yes. Number of falls 2-catches self  LIVING ENVIRONMENT: Lives with: lives with their family Lives in: House/apartment Has following equipment at home: Single point cane and Environmental consultant - 4 wheeled  OCCUPATION: not working  PLOF: Independent  PATIENT GOALS to not have pain   OBJECTIVE:   DIAGNOSTIC FINDINGS:  08/25/17 XR: L5-S1 disc degeneration.  Lower lumbar facet degeneration   TODAY'S TREATMENT  Reviewed pain level, response to prior Rx, and HEP compliance.  Reviewed HEP. Pt performed:  Supine single Knee to chest 2x30 sec bilat  Supine alt UE/LE with TrA 2x10 reps  Supine alt LE ext with TrA 2x10 reps  PPT approx 15 reps  Supine SLR with PPT x 10 reps  SL hip extension arc with TrA x 10 reps   Supine bridge 2 x 10 reps    Lumbar rotation 2x10    Standing rows with retraction with TrA with RTB 2x10    Supine manual HS stretch 2x20-30 sec    Seated HS stretch 2x20-30 sec bilat   PATIENT EDUCATION:  Education details:  HEP and Exercise form.  Educated pt in correct performance and palpation of TrA contraction and performance of PPT.    Person educated: Patient Education method: Explanation, Demonstration, verbal and tactile cues Education comprehension: verbalized understanding and returned demonstration   HOME EXERCISE PROGRAM: Access Code: [email or web address] URL: [email or web address] Date: 11/11/2021 Prepared by: America Brown  Exercises - Supine Single Knee to Chest Stretch  - 2 x daily - 7 x weekly - 1 sets - 2-3 reps - 30 sec hold - Supine Piriformis  Stretch with Leg Straight  - 2 x daily - 7 x weekly - 1 sets - 2-3 reps - 30 sec hold - Supine Hamstring Stretch  - 2 x daily - 7 x weekly - 1 sets - 2-3 reps - 30 sec hold  Updated HEP: Access Code: [email or web address] URL: [email or web address] Date: 12/06/2021 Prepared by: Ronny Flurry  Exercises - Supine Transversus Abdominis Bracing - Hands on Stomach  - 2 x daily - 7 x weekly - 2 sets - 10 reps - 5 seconds hold - Hooklying Lumbar Rotation  - 2 x daily - 7 x weekly - 2 sets - 10 reps   ASSESSMENT:  CLINICAL IMPRESSION: Pt is improving with pain and sx's and reports reduced spasms.  Pt performed exercises well with cuing and instruction in correct form.  She had more difficulty with performing PPT correctly today requiring increased cuing and instruction for correct form.  Pt also had much difficulty with sequencing supine alt UE/LE correctly She continues to  be limited with height of bridge.  She responded well to Rx having no pain after Rx.  She should benefit from cont skilled PT services to address goals and impairments and to improve overall function.   OBJECTIVE IMPAIRMENTS Abnormal gait, decreased activity tolerance, decreased balance, decreased mobility, difficulty walking, decreased strength, hypomobility, increased muscle spasms, impaired flexibility, postural dysfunction, and pain.   ACTIVITY LIMITATIONS lifting, bending, sitting, standing, squatting, transfers, bed mobility, and locomotion level  PARTICIPATION LIMITATIONS: meal prep, cleaning, and community activity  PERSONAL FACTORS 1 comorbidity: LBP  are also affecting patient's functional outcome.   REHAB POTENTIAL: Good  CLINICAL DECISION MAKING: Stable/uncomplicated  EVALUATION COMPLEXITY: Low   GOALS: Goals reviewed with patient? Yes  SHORT TERM GOALS: Target date: 12/09/2021  Pt will be indep with initial HEP to assist with LBP management Baseline:issued at eval Goal status: INITIAL  2.  Pt will  report improvement in R lumbar/R LE cramping with functional activities x 25% Baseline: 0% Goal status: INITIAL  3.  Pt will report improvement in balance walking in bedroom >/=40% to decrease falls risk Baseline: balance issues daily Goal status: INITIAL    LONG TERM GOALS: Target date:  01/06/2022  Pt will be able to get in/out of bed without R lumbar/R LE cramping 75% of the time Baseline: happens 3x/week  Goal status: INITIAL  2.  Pt will be able to sit >/=45 min with </=3/10 R lumbar/R LE pain to eat dinner/watch TV Baseline: 30 min Goal status: INITIAL  3.  Pt will demo improved Berg >/= 46/56 to decrease falls risk Baseline: 38/56 Goal status: INITIAL  4.  Pt will demo improvement in R hip ext strength to >/=4/5 to assist with transfers Baseline: 3/5 Goal status: INITIAL    PLAN: PT FREQUENCY: 2x/week  PT DURATION: 8 weeks  PLANNED INTERVENTIONS: Therapeutic exercises, Therapeutic activity, Neuromuscular re-education, Balance training, Gait training, Patient/Family education, DME instructions, Dry Needling, Electrical stimulation, Cryotherapy, Moist heat, Taping, Ultrasound, Manual therapy, and Re-evaluation.  PLAN FOR NEXT SESSION:  Cont with core strengthening, lumbar mobility, and LE/hip flexibility.   Selinda Michaels III PT, DPT 12/21/21 12:08 PM

## 2021-12-20 ENCOUNTER — Ambulatory Visit (HOSPITAL_BASED_OUTPATIENT_CLINIC_OR_DEPARTMENT_OTHER): Payer: Medicaid Other | Admitting: Physical Therapy

## 2021-12-20 ENCOUNTER — Encounter (HOSPITAL_BASED_OUTPATIENT_CLINIC_OR_DEPARTMENT_OTHER): Payer: Self-pay | Admitting: Physical Therapy

## 2021-12-20 DIAGNOSIS — M6281 Muscle weakness (generalized): Secondary | ICD-10-CM

## 2021-12-20 DIAGNOSIS — R2681 Unsteadiness on feet: Secondary | ICD-10-CM

## 2021-12-20 DIAGNOSIS — M5459 Other low back pain: Secondary | ICD-10-CM | POA: Diagnosis not present

## 2021-12-20 DIAGNOSIS — M79604 Pain in right leg: Secondary | ICD-10-CM

## 2021-12-21 DIAGNOSIS — L659 Nonscarring hair loss, unspecified: Secondary | ICD-10-CM | POA: Diagnosis not present

## 2021-12-24 ENCOUNTER — Ambulatory Visit (HOSPITAL_BASED_OUTPATIENT_CLINIC_OR_DEPARTMENT_OTHER): Payer: Medicaid Other | Admitting: Physical Therapy

## 2021-12-24 ENCOUNTER — Encounter (HOSPITAL_BASED_OUTPATIENT_CLINIC_OR_DEPARTMENT_OTHER): Payer: Self-pay | Admitting: Physical Therapy

## 2021-12-24 DIAGNOSIS — M6281 Muscle weakness (generalized): Secondary | ICD-10-CM

## 2021-12-24 DIAGNOSIS — M5459 Other low back pain: Secondary | ICD-10-CM | POA: Diagnosis not present

## 2021-12-24 DIAGNOSIS — M79604 Pain in right leg: Secondary | ICD-10-CM

## 2021-12-24 DIAGNOSIS — R2681 Unsteadiness on feet: Secondary | ICD-10-CM

## 2021-12-24 NOTE — Therapy (Signed)
OUTPATIENT PHYSICAL THERAPY TREATMENT NOTE   Patient Name: Kendra Griffith MRN: [id number] DOB:12-13-1965, 56 y.o., female Today's Date: 12/24/2021   PT End of Session - 12/24/21 1210     Visit Number 6    Number of Visits 16    Date for PT Re-Evaluation 01/06/22    Authorization Type [facility]    PT Start Time 4008    PT Stop Time 1234    PT Time Calculation (min) 40 min    Activity Tolerance Patient tolerated treatment well;No increased pain    Behavior During Therapy [facility] for tasks assessed/performed               Past Medical History:  Diagnosis Date   ABSCESS, TOOTH 08/20/2009   Qualifier: Diagnosis of  By: Jorene Minors, Scott     Anemia    Anxiety    Bipolar disorder ([place])    Blood transfusion without reported diagnosis    childbirth   Chronic pain    feet and back   Clotting disorder ([place])    childbirth   Dental caries    lesion soft palate   Depression    Diabetes mellitus without complication ([place])    prediabetes   GERD (gastroesophageal reflux disease)    Hypertension    Injury    right side from jumping off bunkbed   Pre-diabetes    Sickle cell trait ([place])    Wears glasses    Past Surgical History:  Procedure Laterality Date   BARTHOLIN CYST MARSUPIALIZATION N/A 07/16/2019   Procedure: EXCISION OF BARTHOLIN CYST;  Surgeon: Chancy Milroy, MD;  Location: [place];  Service: Gynecology;  Laterality: N/A;   CESAREAN SECTION     three   COLONOSCOPY     cyst removal 1997     FACIAL RECONSTRUCTION SURGERY     FOOT SURGERY Bilateral    LESION REMOVAL Left 10/15/2018   Procedure: Lesion Removal left soft palate;  Surgeon: Diona Browner, DDS;  Location: [place];  Service: Oral Surgery;  Laterality: Left;   TOOTH EXTRACTION Bilateral 10/15/2018   Procedure: DENTAL EXTRACTIONS OF TEETH NUMBER TWO, FOUR, FIVE, SIX, SEVEN, EIGHT, NINE, TEN, ELEVEN, TWELVE, THIRTEEN, FOURTEEN, SEVENTEEN, TWENTY-ONE, TWENTY-TWO, TWENTY-THREE, TWENTY-FOUR,  TWENTY-FIVE, TWENTY-SIX, TWENTY-SEVEN, TWENTY-EIGHT WITH ALVEOLOPLASTY;  Surgeon: Diona Browner, DDS;  Location: [place];  Service: Oral Surgery;  Laterality: Bilateral;   TUBAL LIGATION     Patient Active Problem List   Diagnosis Date Noted   Post-operative state 67/61/9509   Alcoholic peripheral neuropathy ([place]) 06/09/2017   Tobacco use 06/09/2017   Breast cancer screening 01/21/2016   Chronic pain syndrome 09/25/2014   Type 2 diabetes mellitus with diabetic polyneuropathy, without long-term current use of insulin ([place]) 06/30/2014   Peripheral neuropathy 05/09/2013   Poor dentition 02/01/2013   Pain in joint, ankle and foot 02/01/2013   Essential hypertension, benign 02/01/2013   FOOT PAIN, BILATERAL 06/16/2010   RECTAL BLEEDING 12/08/2009   HEMATURIA UNSPECIFIED 12/08/2009   DEPRESSION 08/20/2009   Mixed hyperlipidemia 07/30/2009   ANEMIA-NOS 07/30/2009   GERD 07/30/2009   CALLUS, FOOT 07/30/2009    PCP: [name], NP  REFERRING PROVIDER: [name], NP  REFERRING DIAG: G89.4 (ICD-10-CM) - Chronic pain syndrome  Rationale for Evaluation and Treatment Rehabilitation  THERAPY DIAG:  Other low back pain  Muscle weakness (generalized)  Pain in right leg  Unsteadiness on feet  ONSET DATE: 3 years  SUBJECTIVE:  SUBJECTIVE STATEMENT: Pt reports she has been performing HEP.  Pt hasn't been having spasms or cramps.  Pt denies pain currently.  Pt denies any adverse effects after prior Rx.  Pt reports improved pain in back and legs overall.  Pt reports her L ankle was bothering her this AM.  She states she has had issues with her L LE wanting to give out.  Pt states she has difficulty with bending due to pain and balance.    PERTINENT HISTORY:  Lower back muscle spasms R side and have  Right leg cramps from the calf and all the way down. Pt reports dehydration. Smoker. Had PT before 2020 or 2021.  Neuropathy in bilat feet  PAIN:  Are you having pain? Yes: NPRS scale: 0/10 Pain location: R sided thoracic Pain description: cramping, sharpness Aggravating factors: leg elevation sometimes, supine positioning Relieving factors: stretching   PRECAUTIONS: fall risk  WEIGHT BEARING RESTRICTIONS No  FALLS:  Has patient fallen in last 6 months? Yes. Number of falls 2-catches self  LIVING ENVIRONMENT: Lives with: lives with their family Lives in: House/apartment Has following equipment at home: Single point cane and Environmental consultant - 4 wheeled  OCCUPATION: not working  PLOF: Independent  PATIENT GOALS to not have pain   OBJECTIVE:   DIAGNOSTIC FINDINGS:  08/25/17 XR: L5-S1 disc degeneration.  Lower lumbar facet degeneration   TODAY'S TREATMENT  Therapeutic Exercise: Reviewed pain level, response to prior Rx, and HEP compliance.  Reviewed HEP. Pt performed:  Supine marching with TrA x10 reps  Supine alt UE/LE with TrA 2x10 reps  Supine alt LE ext with TrA x10 reps and x 10 reps while holding 2# DB  PPT x 10 reps  Supine SLR with PPT x 10 reps  SL hip extension arc with TrA x 10 reps   Supine bridge 2 x 10 reps    Seated HS stretch 2x20-30 sec bilat   See below for pt education    Therapeutic Activity:   Educated pt with correct squat to lift mechanics and hip hinging.  PT demonstrated and had pt perform.  Pt also performed squat to lift a 5# KB from the elevated table with instruction and cuing from PT.    PATIENT EDUCATION:  Education details:  PT answered questions including the rationale of using ice vs heat.  HEP and Exercise form.  Educated pt in correct performance and palpation of TrA contraction and performance of PPT.   Hip hinge technique and squat to lift mechanics Person educated: Patient Education method: Explanation, Demonstration, verbal and  tactile cues Education comprehension: verbalized understanding and returned demonstration   HOME EXERCISE PROGRAM: Access Code: [email or web address] URL: [email or web address] Date: 11/11/2021 Prepared by: America Brown  Exercises - Supine Single Knee to Chest Stretch  - 2 x daily - 7 x weekly - 1 sets - 2-3 reps - 30 sec hold - Supine Piriformis Stretch with Leg Straight  - 2 x daily - 7 x weekly - 1 sets - 2-3 reps - 30 sec hold - Supine Hamstring Stretch  - 2 x daily - 7 x weekly - 1 sets - 2-3 reps - 30 sec hold  Updated HEP: Access Code: [email or web address] URL: [email or web address] Date: 12/06/2021 Prepared by: Ronny Flurry  Exercises - Supine Transversus Abdominis Bracing - Hands on Stomach  - 2 x daily - 7 x weekly - 2 sets - 10 reps - 5 seconds hold - Hooklying Lumbar Rotation  - 2 x daily - 7 x weekly -  2 sets - 10 reps   ASSESSMENT:  CLINICAL IMPRESSION: Pt is improving with pain and sx's and is not having spasms.  She requires cuing and instruction to perform exercises correctly.  Pt had improved form with PPT though continues to have much difficulty with sequencing supine alt UE/LE correctly.  She continues to be limited with height of bridge.  PT demonstrated hip hinge and correct form with squat to form mechanics to avoid bending and reduced lumbar stress.  Pt practiced in the clinic and requires instruction for correct form.  She responded well to Rx having no lumbar pain after Rx.  Pt reports she was having ankle pain this AM and had minimal pain in anterior ankle after Rx.  She should benefit from cont skilled PT services to address goals and impairments and to improve overall function.    OBJECTIVE IMPAIRMENTS Abnormal gait, decreased activity tolerance, decreased balance, decreased mobility, difficulty walking, decreased strength, hypomobility, increased muscle spasms, impaired flexibility, postural dysfunction, and pain.   ACTIVITY LIMITATIONS lifting,  bending, sitting, standing, squatting, transfers, bed mobility, and locomotion level  PARTICIPATION LIMITATIONS: meal prep, cleaning, and community activity  PERSONAL FACTORS 1 comorbidity: LBP  are also affecting patient's functional outcome.   REHAB POTENTIAL: Good  CLINICAL DECISION MAKING: Stable/uncomplicated  EVALUATION COMPLEXITY: Low   GOALS: Goals reviewed with patient? Yes  SHORT TERM GOALS: Target date: 12/09/2021  Pt will be indep with initial HEP to assist with LBP management Baseline:issued at eval Goal status: INITIAL  2.  Pt will report improvement in R lumbar/R LE cramping with functional activities x 25% Baseline: 0% Goal status: INITIAL  3.  Pt will report improvement in balance walking in bedroom >/=40% to decrease falls risk Baseline: balance issues daily Goal status: INITIAL    LONG TERM GOALS: Target date:  01/06/2022  Pt will be able to get in/out of bed without R lumbar/R LE cramping 75% of the time Baseline: happens 3x/week  Goal status: INITIAL  2.  Pt will be able to sit >/=45 min with </=3/10 R lumbar/R LE pain to eat dinner/watch TV Baseline: 30 min Goal status: INITIAL  3.  Pt will demo improved Berg >/= 46/56 to decrease falls risk Baseline: 38/56 Goal status: INITIAL  4.  Pt will demo improvement in R hip ext strength to >/=4/5 to assist with transfers Baseline: 3/5 Goal status: INITIAL    PLAN: PT FREQUENCY: 2x/week  PT DURATION: 8 weeks  PLANNED INTERVENTIONS: Therapeutic exercises, Therapeutic activity, Neuromuscular re-education, Balance training, Gait training, Patient/Family education, DME instructions, Dry Needling, Electrical stimulation, Cryotherapy, Moist heat, Taping, Ultrasound, Manual therapy, and Re-evaluation.  PLAN FOR NEXT SESSION:  Cont with core strengthening, lumbar mobility, and LE/hip flexibility.   Selinda Michaels III PT, DPT 12/24/21 5:54 PM

## 2021-12-28 ENCOUNTER — Encounter (HOSPITAL_BASED_OUTPATIENT_CLINIC_OR_DEPARTMENT_OTHER): Payer: Medicaid Other | Admitting: Physical Therapy

## 2021-12-29 ENCOUNTER — Ambulatory Visit (HOSPITAL_BASED_OUTPATIENT_CLINIC_OR_DEPARTMENT_OTHER): Payer: Medicaid Other | Admitting: Physical Therapy

## 2021-12-29 ENCOUNTER — Encounter (HOSPITAL_BASED_OUTPATIENT_CLINIC_OR_DEPARTMENT_OTHER): Payer: Self-pay | Admitting: Physical Therapy

## 2021-12-29 DIAGNOSIS — R2681 Unsteadiness on feet: Secondary | ICD-10-CM

## 2021-12-29 DIAGNOSIS — M5459 Other low back pain: Secondary | ICD-10-CM | POA: Diagnosis not present

## 2021-12-29 DIAGNOSIS — M6281 Muscle weakness (generalized): Secondary | ICD-10-CM

## 2021-12-29 DIAGNOSIS — M79604 Pain in right leg: Secondary | ICD-10-CM

## 2021-12-29 NOTE — Therapy (Addendum)
OUTPATIENT PHYSICAL THERAPY TREATMENT NOTE   Patient Name: Kendra Griffith MRN: [id number] DOB:14-Jun-1965, 56 y.o., female Today's Date: 12/29/2021   PT End of Session - 12/29/21 0846     Visit Number 7    Number of Visits 16    Date for PT Re-Evaluation 01/06/22    Authorization Type [place] [employer]    PT Start Time 0844    PT Stop Time 0927    PT Time Calculation (min) 43 min    Activity Tolerance Patient tolerated treatment well;No increased pain    Behavior During Therapy [facility] for tasks assessed/performed               Past Medical History:  Diagnosis Date   ABSCESS, TOOTH 08/20/2009   Qualifier: Diagnosis of  By: Jorene Minors, Scott     Anemia    Anxiety    Bipolar disorder ([place])    Blood transfusion without reported diagnosis    childbirth   Chronic pain    feet and back   Clotting disorder ([place])    childbirth   Dental caries    lesion soft palate   Depression    Diabetes mellitus without complication ([place])    prediabetes   GERD (gastroesophageal reflux disease)    Hypertension    Injury    right side from jumping off bunkbed   Pre-diabetes    Sickle cell trait ([place])    Wears glasses    Past Surgical History:  Procedure Laterality Date   BARTHOLIN CYST MARSUPIALIZATION N/A 07/16/2019   Procedure: EXCISION OF BARTHOLIN CYST;  Surgeon: Chancy Milroy, MD;  Location: [place];  Service: Gynecology;  Laterality: N/A;   CESAREAN SECTION     three   COLONOSCOPY     cyst removal 1997     FACIAL RECONSTRUCTION SURGERY     FOOT SURGERY Bilateral    LESION REMOVAL Left 10/15/2018   Procedure: Lesion Removal left soft palate;  Surgeon: Diona Browner, DDS;  Location: [place];  Service: Oral Surgery;  Laterality: Left;   TOOTH EXTRACTION Bilateral 10/15/2018   Procedure: DENTAL EXTRACTIONS OF TEETH NUMBER TWO, FOUR, FIVE, SIX, SEVEN, EIGHT, NINE, TEN, ELEVEN, TWELVE, THIRTEEN, FOURTEEN, SEVENTEEN, TWENTY-ONE, TWENTY-TWO, TWENTY-THREE, TWENTY-FOUR,  TWENTY-FIVE, TWENTY-SIX, TWENTY-SEVEN, TWENTY-EIGHT WITH ALVEOLOPLASTY;  Surgeon: Diona Browner, DDS;  Location: [place];  Service: Oral Surgery;  Laterality: Bilateral;   TUBAL LIGATION     Patient Active Problem List   Diagnosis Date Noted   Post-operative state [id number]/42/6834   Alcoholic peripheral neuropathy ([place]) 06/09/2017   Tobacco use 06/09/2017   Breast cancer screening 01/21/2016   Chronic pain syndrome 09/25/2014   Type 2 diabetes mellitus with diabetic polyneuropathy, without long-term current use of insulin ([place]) 06/30/2014   Peripheral neuropathy 05/09/2013   Poor dentition 02/01/2013   Pain in joint, ankle and foot 02/01/2013   Essential hypertension, benign 02/01/2013   FOOT PAIN, BILATERAL 06/16/2010   RECTAL BLEEDING 12/08/2009   HEMATURIA UNSPECIFIED 12/08/2009   DEPRESSION 08/20/2009   Mixed hyperlipidemia 07/30/2009   ANEMIA-NOS 07/30/2009   GERD 07/30/2009   CALLUS, FOOT 07/30/2009    PCP: [name], NP  REFERRING PROVIDER: [name], NP  REFERRING DIAG: G89.4 (ICD-10-CM) - Chronic pain syndrome  Rationale for Evaluation and Treatment Rehabilitation  THERAPY DIAG:  Other low back pain  Muscle weakness (generalized)  Pain in right leg  Unsteadiness on feet  ONSET DATE: 3 years  SUBJECTIVE:  SUBJECTIVE STATEMENT: Pt reports she has been performing HEP.  Pt reports had one leg cramp the other night though didn't persist.  She got up and stretched.  Pt denies pain currently. Pt states she felt ok but not great after prior Rx.  Pt states she is hoping her balance/steadiness is improving.  Pt reports she has more unsteadiness with bending than walking.  Pt states it's hard to tell if she has any recent functional improvement due to her sleep patterns.  She was able to  sleep 3-4 hours last night.     PERTINENT HISTORY:  Lower back muscle spasms R side and have Right leg cramps from the calf and all the way down. Pt reports dehydration. Smoker. Had PT before 2020 or 2021.  Neuropathy in bilat feet  PAIN:  Are you having pain? Yes: NPRS scale: 0/10 Pain location: back Pain description: cramping, sharpness Aggravating factors: leg elevation sometimes, supine positioning Relieving factors: stretching   PRECAUTIONS: fall risk  WEIGHT BEARING RESTRICTIONS No  FALLS:  Has patient fallen in last 6 months? Yes. Number of falls 2-catches self  LIVING ENVIRONMENT: Lives with: lives with their family Lives in: House/apartment Has following equipment at home: Single point cane and Environmental consultant - 4 wheeled  OCCUPATION: not working  PLOF: Independent  PATIENT GOALS to not have pain   OBJECTIVE:   DIAGNOSTIC FINDINGS:  08/25/17 XR: L5-S1 disc degeneration.  Lower lumbar facet degeneration   TODAY'S TREATMENT  Therapeutic Exercise: Reviewed pain level, response to prior Rx, and HEP compliance.  Reviewed HEP. Pt performed:  Supine marching with TrA x10 reps  Supine alt UE/LE with TrA 2x10 reps  Supine alt LE ext with TrA x10 reps and x 10 reps while holding 2# DB  PPT x 10 reps  Supine SLR with PPT 2 x 10 reps  Supine SL hip extension arc with TrA x 10 reps   Supine bridge 2 x 10 reps    Seated HS stretch 2x20-30 sec bilat    Standing rows with GTB with TrA 2x10    Standing shoulder extension with RTB with TrA 2x10    Standing Paloff press with TrA with RTB x 10 each   See below for pt education    Therapeutic Activity:   Educated pt with correct squat to lift mechanics and hip hinging.  PT demonstrated and had pt perform.  Pt performed squat to lift with a 5# KB from the elevated table with instruction and cuing from PT.    PATIENT EDUCATION:  Education details:  PT answered questions including the rationale of using ice vs heat.  HEP and  Exercise form.  Educated pt in correct performance and palpation of TrA contraction and performance of PPT.   Hip hinge technique and squat to lift mechanics Person educated: Patient Education method: Explanation, Demonstration, verbal and tactile cues Education comprehension: verbalized understanding and returned demonstration   HOME EXERCISE PROGRAM: Access Code: [email or web address] URL: [email or web address] Date: 11/11/2021 Prepared by: America Brown  Exercises - Supine Single Knee to Chest Stretch  - 2 x daily - 7 x weekly - 1 sets - 2-3 reps - 30 sec hold - Supine Piriformis Stretch with Leg Straight  - 2 x daily - 7 x weekly - 1 sets - 2-3 reps - 30 sec hold - Supine Hamstring Stretch  - 2 x daily - 7 x weekly - 1 sets - 2-3 reps - 30 sec hold  Updated HEP: Access Code: [email or web address] URL: [email or web address] Date:  12/06/2021 Prepared by: Ronny Flurry  Exercises - Supine Transversus Abdominis Bracing - Hands on Stomach  - 2 x daily - 7 x weekly - 2 sets - 10 reps - 5 seconds hold - Hooklying Lumbar Rotation  - 2 x daily - 7 x weekly - 2 sets - 10 reps   ASSESSMENT:  CLINICAL IMPRESSION: Pt reports no specific functional improvements though states it's hard for her to tell due to her sleeping patterns.  Pt has insomnia.  Pt denies pain when presenting to Rx.  PT progressed exercises today and Pt had good tolerance.  She requires much cuing and instruction for correct form with exercises including PPT and supine alt UE/LE.  PT instructed pt in hip hinging/squat to lift technique and demonstrated correct form.  Pt requires much cuing and instruction for proper squat to lift technique and hip hinging.  She had much difficulty with demonstrating correct form with hip hinging.  Pt responded well to Rx having no pain after Rx.     OBJECTIVE IMPAIRMENTS Abnormal gait, decreased activity tolerance, decreased balance, decreased mobility, difficulty walking, decreased  strength, hypomobility, increased muscle spasms, impaired flexibility, postural dysfunction, and pain.   ACTIVITY LIMITATIONS lifting, bending, sitting, standing, squatting, transfers, bed mobility, and locomotion level  PARTICIPATION LIMITATIONS: meal prep, cleaning, and community activity  PERSONAL FACTORS 1 comorbidity: LBP  are also affecting patient's functional outcome.   REHAB POTENTIAL: Good  CLINICAL DECISION MAKING: Stable/uncomplicated  EVALUATION COMPLEXITY: Low   GOALS: Goals reviewed with patient? Yes  SHORT TERM GOALS: Target date: 12/09/2021  Pt will be indep with initial HEP to assist with LBP management Baseline:issued at eval Goal status: INITIAL  2.  Pt will report improvement in R lumbar/R LE cramping with functional activities x 25% Baseline: 0% Goal status: INITIAL  3.  Pt will report improvement in balance walking in bedroom >/=40% to decrease falls risk Baseline: balance issues daily Goal status: INITIAL    LONG TERM GOALS: Target date:  01/06/2022  Pt will be able to get in/out of bed without R lumbar/R LE cramping 75% of the time Baseline: happens 3x/week  Goal status: INITIAL  2.  Pt will be able to sit >/=45 min with </=3/10 R lumbar/R LE pain to eat dinner/watch TV Baseline: 30 min Goal status: INITIAL  3.  Pt will demo improved Berg >/= 46/56 to decrease falls risk Baseline: 38/56 Goal status: INITIAL  4.  Pt will demo improvement in R hip ext strength to >/=4/5 to assist with transfers Baseline: 3/5 Goal status: INITIAL    PLAN: PT FREQUENCY: 2x/week  PT DURATION: 8 weeks  PLANNED INTERVENTIONS: Therapeutic exercises, Therapeutic activity, Neuromuscular re-education, Balance training, Gait training, Patient/Family education, DME instructions, Dry Needling, Electrical stimulation, Cryotherapy, Moist heat, Taping, Ultrasound, Manual therapy, and Re-evaluation.  PLAN FOR NEXT SESSION:  Cont with core strengthening, lumbar  mobility, and LE/hip flexibility.   Selinda Michaels III PT, DPT 12/29/21 4:38 PM  PHYSICAL THERAPY DISCHARGE SUMMARY  Visits from Start of Care: 7  Current functional level related to goals / functional outcomes: Unable to assess current functional status or goals due to pt not present at discharge.     Remaining deficits: Unable to assess   Education / Equipment: Pt has a HEP.  Pt was seen in PT from 11/11/21/ - 12/29/2021.  She then cancelled multiple appointments due to multiple reasons including high BP issues, scheduling conflicts, and side effects from the flu shot.  PT has not heard back  from pt considering scheduling.  Pt will be considered discharged at this time.    Selinda Michaels III PT, DPT 04/16/22 4:54 PM

## 2022-01-03 ENCOUNTER — Encounter (HOSPITAL_BASED_OUTPATIENT_CLINIC_OR_DEPARTMENT_OTHER): Payer: Medicaid Other | Admitting: Physical Therapy

## 2022-01-03 ENCOUNTER — Encounter (HOSPITAL_BASED_OUTPATIENT_CLINIC_OR_DEPARTMENT_OTHER): Payer: Self-pay

## 2022-01-05 ENCOUNTER — Encounter: Payer: Self-pay | Admitting: Nurse Practitioner

## 2022-01-05 ENCOUNTER — Ambulatory Visit: Payer: Medicaid Other | Attending: Nurse Practitioner | Admitting: Nurse Practitioner

## 2022-01-05 VITALS — BP 143/97 | HR 69 | Temp 98.0°F | Resp 16 | Ht 65.0 in | Wt 172.0 lb

## 2022-01-05 DIAGNOSIS — Z23 Encounter for immunization: Secondary | ICD-10-CM | POA: Diagnosis not present

## 2022-01-05 DIAGNOSIS — G894 Chronic pain syndrome: Secondary | ICD-10-CM | POA: Diagnosis not present

## 2022-01-05 DIAGNOSIS — R7303 Prediabetes: Secondary | ICD-10-CM | POA: Diagnosis not present

## 2022-01-05 DIAGNOSIS — I10 Essential (primary) hypertension: Secondary | ICD-10-CM

## 2022-01-05 DIAGNOSIS — E785 Hyperlipidemia, unspecified: Secondary | ICD-10-CM

## 2022-01-05 DIAGNOSIS — R413 Other amnesia: Secondary | ICD-10-CM | POA: Diagnosis not present

## 2022-01-05 MED ORDER — LOSARTAN POTASSIUM 25 MG PO TABS: 25.0000 mg | ORAL_TABLET | Freq: Every day | ORAL | 1 refills | Status: DC

## 2022-01-05 MED ORDER — ACETAMINOPHEN-CODEINE 300-30 MG PO TABS: 1.0000 | ORAL_TABLET | ORAL | 0 refills | Status: DC | PRN

## 2022-01-05 NOTE — Progress Notes (Signed)
Assessment & Plan:  Kendra Griffith was seen today for hypertension, hormones and fall.  Diagnoses and all orders for this visit:  Primary hypertension -     CMP14+EGFR -     losartan (COZAAR) 25 MG tablet; Take 1 tablet (25 mg total) by mouth daily. FOR BLOOD PRESSURE Continue all antihypertensives as prescribed.  Reminded to bring in blood pressure log for follow  up appointment.  RECOMMENDATIONS: DASH/Mediterranean Diets are healthier choices for HTN.    Prediabetes -     Hemoglobin A1c -     Ambulatory referral to Ophthalmology  Short-term memory loss -     Ambulatory referral to Neurology    Patient has been counseled on age-appropriate routine health concerns for screening and prevention. These are reviewed and up-to-date. Referrals have been placed accordingly. Immunizations are up-to-date or declined.    Subjective:   Chief Complaint  Patient presents with   Hypertension   hormones   Fall   Hypertension Pertinent negatives include no blurred vision, chest pain, headaches, malaise/fatigue, palpitations or shortness of breath.  Fall Pertinent negatives include no fever, headaches, nausea or vomiting.   Kendra Griffith 56 y.o. female presents to office today for follow up to HTN.   HTN Blood pressure is elevated today. She is currently prescribed amlodipine 10 mg daily. Will add losartan 25 mg today.  BP Readings from Last 3 Encounters:  01/05/22 (!) 143/97  10/04/21 (!) 148/89  01/15/21 (!) 152/93    She did have a question about menopause and I explained to her that menopause is the absence of a menstrual cycle for 365 days per week.   Memory Loss Endorses short term memory loss ongoing for several months, sometimes forgets to take her medications, easily distracted and hard to focus. Sometimes forgets to eat, forgets important dates. Denies any history of alzheimer's or dementia in her family.    Review of Systems  Constitutional:  Negative for fever,  malaise/fatigue and weight loss.  HENT: Negative.  Negative for nosebleeds.   Eyes: Negative.  Negative for blurred vision, double vision and photophobia.  Respiratory: Negative.  Negative for cough and shortness of breath.   Cardiovascular: Negative.  Negative for chest pain, palpitations and leg swelling.  Gastrointestinal: Negative.  Negative for heartburn, nausea and vomiting.  Musculoskeletal: Negative.  Negative for myalgias.  Neurological: Negative.  Negative for dizziness, focal weakness, seizures and headaches.  Psychiatric/Behavioral:  Positive for memory loss. Negative for suicidal ideas.     Past Medical History:  Diagnosis Date   ABSCESS, TOOTH 08/20/2009   Qualifier: Diagnosis of  By: Jorene Minors, Scott     Anemia    Anxiety    Bipolar disorder ([place])    Blood transfusion without reported diagnosis    childbirth   Chronic pain    feet and back   Clotting disorder ([place])    childbirth   Dental caries    lesion soft palate   Depression    Diabetes mellitus without complication ([place])    prediabetes   GERD (gastroesophageal reflux disease)    Hypertension    Injury    right side from jumping off bunkbed   Pre-diabetes    Sickle cell trait ([place])    Wears glasses     Past Surgical History:  Procedure Laterality Date   BARTHOLIN CYST MARSUPIALIZATION N/A 07/16/2019   Procedure: EXCISION OF BARTHOLIN CYST;  Surgeon: Chancy Milroy, MD;  Location: [place] [facility];  Service: Gynecology;  Laterality:  N/A;   CESAREAN SECTION     three   COLONOSCOPY     cyst removal 1997     FACIAL RECONSTRUCTION SURGERY     FOOT SURGERY Bilateral    LESION REMOVAL Left 10/15/2018   Procedure: Lesion Removal left soft palate;  Surgeon: Diona Browner, DDS;  Location: [facility];  Service: Oral Surgery;  Laterality: Left;   TOOTH EXTRACTION Bilateral 10/15/2018   Procedure: DENTAL EXTRACTIONS OF TEETH NUMBER TWO, FOUR, FIVE, SIX, SEVEN, EIGHT, NINE, TEN, ELEVEN, TWELVE, THIRTEEN,  FOURTEEN, SEVENTEEN, TWENTY-ONE, TWENTY-TWO, TWENTY-THREE, TWENTY-FOUR, TWENTY-FIVE, TWENTY-SIX, TWENTY-SEVEN, TWENTY-EIGHT WITH ALVEOLOPLASTY;  Surgeon: Diona Browner, DDS;  Location: [facility];  Service: Oral Surgery;  Laterality: Bilateral;   TUBAL LIGATION      Family History  Problem Relation Age of Onset   Heart disease Mother    Asthma Sister    Diabetes Sister    COPD Sister    Diabetes Sister    Diabetes Sister    Breast cancer Neg Hx    Colon cancer Neg Hx    Esophageal cancer Neg Hx    Pancreatic cancer Neg Hx    Liver disease Neg Hx    Stomach cancer Neg Hx     Social History Reviewed with no changes to be made today.   Outpatient Medications Prior to Visit  Medication Sig Dispense Refill   acetaminophen-codeine (TYLENOL #3) 300-30 MG tablet Take 1-2 tablets by mouth every 4 (four) hours as needed for moderate pain. 60 tablet 0   albuterol (VENTOLIN HFA) 108 (90 Base) MCG/ACT inhaler Inhale 2 puffs into the lungs every 6 (six) hours as needed for wheezing or shortness of breath. 18 g 1   amitriptyline (ELAVIL) 75 MG tablet Take 1 tablet (75 mg total) by mouth at bedtime. 90 tablet 1   amLODipine (NORVASC) 10 MG tablet Take 1 tablet (10 mg total) by mouth daily. for blood pressure 90 tablet 1   Biotin 5 MG CAPS Take 5 mg by mouth 3 (three) times daily.     chlorhexidine (HIBICLENS) 4 % external liquid Apply topically daily as needed. Use when bathing or showering 946 mL 0   cholecalciferol (VITAMIN D3) 25 MCG (1000 UT) tablet Take 1,000 Units by mouth daily.     famotidine (PEPCID) 20 MG tablet Take 1 tablet (20 mg total) by mouth 2 (two) times daily. 180 tablet 1   ferrous sulfate 324 MG TBEC Take 324 mg by mouth.     gabapentin (NEURONTIN) 300 MG capsule Take 1 capsule (300 mg total) by mouth 2 (two) times daily. 180 capsule 1   GLUTAMINE PO Take 500 mg by mouth.     hydrocortisone (ANUSOL-HC) 2.5 % rectal cream Place 1 application rectally at bedtime. 30 g 1    hydroquinone 4 % cream Apply topically 2 (two) times daily.     hydrOXYzine (ATARAX) 25 MG tablet Take 1 tablet (25 mg total) by mouth 3 (three) times daily as needed. 90 tablet 1   ibuprofen (ADVIL) 800 MG tablet Take 1 tablet (800 mg total) by mouth every 8 (eight) hours as needed. 60 tablet 1   Multiple Vitamin (MULTI-VITAMIN) tablet Take 1 tablet by mouth daily.     nicotine polacrilex (NICORETTE) 2 MG gum Take 1 each (2 mg total) by mouth as needed for smoking cessation. 100 tablet 0   NONFORMULARY OR COMPOUNDED ITEM Peripheral Neuropathy Cream: Bupivacaine 1%, Doxepin 3%, Gabapentin 6%, Pentoxifylline 3%, Topiramate 1% Order faxed to [facility] 1  each 3   Omega-3 Fatty Acids (OMEGA 3 500) 500 MG CAPS Take 500 mg by mouth daily.     omeprazole (PRILOSEC) 20 MG capsule Take 1 capsule (20 mg total) by mouth daily. 90 capsule 1   polyethylene glycol powder (GLYCOLAX/MIRALAX) 17 GM/SCOOP powder Take 17 g by mouth 2 (two) times daily as needed. 3350 g 1   vitamin B-12 (CYANOCOBALAMIN) 500 MCG tablet Take 500 mcg by mouth daily.     vitamin C (ASCORBIC ACID) 500 MG tablet Take 500 mg by mouth daily.     Zinc 50 MG CAPS Take 1 capsule by mouth daily.     atorvastatin (LIPITOR) 10 MG tablet Take 1 tablet (10 mg total) by mouth daily. (Patient not taking: Reported on 01/05/2022) 90 tablet 3   ezetimibe (ZETIA) 10 MG tablet Take 1 tablet (10 mg total) by mouth daily. 90 tablet 3   No facility-administered medications prior to visit.    Allergies  Allergen Reactions   Amoxicillin     Pain Did it involve swelling of the face/tongue/throat, SOB, or low BP? No Did it involve sudden or severe rash/hives, skin peeling, or any reaction on the inside of your mouth or nose? No Did you need to seek medical attention at a hospital or doctor's office? Yes When did it last happen?      5 years ago If all above answers are "NO", may proceed with cephalosporin use.    Darvocet [Propoxyphene  N-Acetaminophen] Nausea And Vomiting    Makes her jittery   Hydrocodone-Acetaminophen Nausea And Vomiting    Upset stomach   Percocet [Oxycodone-Acetaminophen]     Makes her jittery   Valium [Diazepam] Other (See Comments)    Makes pt. Feel "out of wack"       Objective:    BP (!) 143/97 (BP Location: Left Arm, Patient Position: Sitting, Cuff Size: Large)   Pulse 69   Temp 98 F (36.7 C) (Oral)   Resp 16   Ht $R'5\' 5"'pp$  (1.651 m)   Wt 172 lb (78 kg)   LMP 03/12/2019 (Approximate)   SpO2 99%   BMI 28.62 kg/m  Wt Readings from Last 3 Encounters:  01/05/22 172 lb (78 kg)  10/04/21 169 lb (76.7 kg)  01/15/21 168 lb (76.2 kg)    Physical Exam Vitals and nursing note reviewed.  Constitutional:      Appearance: She is well-developed.  HENT:     Head: Normocephalic and atraumatic.  Cardiovascular:     Rate and Rhythm: Normal rate and regular rhythm.     Heart sounds: Normal heart sounds. No murmur heard.    No friction rub. No gallop.  Pulmonary:     Effort: Pulmonary effort is normal. No tachypnea or respiratory distress.     Breath sounds: Normal breath sounds. No decreased breath sounds, wheezing, rhonchi or rales.  Chest:     Chest wall: No tenderness.  Abdominal:     General: Bowel sounds are normal.     Palpations: Abdomen is soft.  Musculoskeletal:        General: Normal range of motion.     Cervical back: Normal range of motion.  Skin:    General: Skin is warm and dry.  Neurological:     Mental Status: She is alert and oriented to person, place, and time.     Coordination: Coordination normal.  Psychiatric:        Behavior: Behavior normal. Behavior is cooperative.  Thought Content: Thought content normal.        Judgment: Judgment normal.          Patient has been counseled extensively about nutrition and exercise as well as the importance of adherence with medications and regular follow-up. The patient was given clear instructions to go to ER or  return to medical center if symptoms don't improve, worsen or new problems develop. The patient verbalized understanding.   Follow-up: Return for BP check luke 4-5 weeks and BMP. see me in 3 months.   [name], FNP-BC [facility] and [facility] [place], [place]   01/05/2022, 9:05 AM

## 2022-01-06 ENCOUNTER — Ambulatory Visit (HOSPITAL_BASED_OUTPATIENT_CLINIC_OR_DEPARTMENT_OTHER): Payer: Medicaid Other | Admitting: Physical Therapy

## 2022-01-06 LAB — HEMOGLOBIN A1C
Est. average glucose Bld gHb Est-mCnc: 134 mg/dL
Hgb A1c MFr Bld: 6.3 % — ABNORMAL HIGH (ref 4.8–5.6)

## 2022-01-06 LAB — LIPID PANEL
Chol/HDL Ratio: 2.4 ratio (ref 0.0–4.4)
Cholesterol, Total: 159 mg/dL (ref 100–199)
HDL: 65 mg/dL (ref >39)
LDL Chol Calc (NIH): 83 mg/dL (ref 0–99)
Triglycerides: 56 mg/dL (ref 0–149)
VLDL Cholesterol Cal: 11 mg/dL (ref 5–40)

## 2022-01-06 LAB — CMP14+EGFR
ALT: 15 IU/L (ref 0–32)
AST: 19 IU/L (ref 0–40)
Albumin/Globulin Ratio: 1.7 (ref 1.2–2.2)
Albumin: 4.5 g/dL (ref 3.8–4.9)
Alkaline Phosphatase: 126 IU/L — ABNORMAL HIGH (ref 44–121)
BUN/Creatinine Ratio: 12 (ref 9–23)
BUN: 10 mg/dL (ref 6–24)
Bilirubin Total: <0.2 mg/dL (ref 0.0–1.2)
CO2: 24 mmol/L (ref 20–29)
Calcium: 9.8 mg/dL (ref 8.7–10.2)
Chloride: 103 mmol/L (ref 96–106)
Creatinine, Ser: 0.86 mg/dL (ref 0.57–1.00)
Globulin, Total: 2.6 g/dL (ref 1.5–4.5)
Glucose: 128 mg/dL — ABNORMAL HIGH (ref 70–99)
Potassium: 4.3 mmol/L (ref 3.5–5.2)
Sodium: 141 mmol/L (ref 134–144)
Total Protein: 7.1 g/dL (ref 6.0–8.5)
eGFR: 79 mL/min/{1.73_m2} (ref >59)

## 2022-01-10 ENCOUNTER — Ambulatory Visit: Payer: Self-pay

## 2022-01-10 NOTE — Telephone Encounter (Signed)
Pharmacy called and d/t long hold time again unable to speak with RPH. Pt called, LVMTCB on cell # to call back to discuss medication. Will route to provider.

## 2022-01-10 NOTE — Telephone Encounter (Signed)
[facility] called and d/t long hold time and multiple in que hung up and will try again. Pt called on home # and got message "number is not in service", called cell # and LVMTCB.   Summary: med not at pharmacy   Pt states her losartan (COZAAR) 25 MG tablet is not at the pharmacy, even though it says it has been received. She said her son spoke w/ someone tere who told him it is not there

## 2022-01-11 ENCOUNTER — Encounter (HOSPITAL_BASED_OUTPATIENT_CLINIC_OR_DEPARTMENT_OTHER): Payer: Medicaid Other | Admitting: Physical Therapy

## 2022-01-11 NOTE — Telephone Encounter (Signed)
Pt called back, advised her that I called pharmacy multiple times yesterday and was unable to speak with someone. Pt states she tried contacting them as well with no success. Pt is still needing medication. I advised her I would try to contact pharmacy now for medication and give her a call back if I was able to speak with someone. Pt asking about mail delivery pharmacy and I informed pt to call her insurance to see if they can refer her to the company they use since insurances use different ones. Pt verbalized understanding.   [facility] pharmacy called, spoke with Jellino, [place] who states rx was received on 01/05/22 #90/1 and would go ahead and fill medication and pt can pu tomorrow after 0800.   Called pt back to notify her of rx refill status. Pt verbalized understanding.

## 2022-01-11 NOTE — Telephone Encounter (Signed)
Answer Assessment - Initial Assessment Questions 1. NAME of MEDICINE: "What medicine(s) are you calling about?"     losartan 2. QUESTION: "What is your question?" (e.g., double dose of medicine, side effect)     Pharmacy didn't receive rx  3. PRESCRIBER: "Who prescribed the medicine?" Reason: if prescribed by specialist, call should be referred to that group.     Zelda, NP  Protocols used: Medication Question Call-A-AH

## 2022-01-13 ENCOUNTER — Encounter (HOSPITAL_BASED_OUTPATIENT_CLINIC_OR_DEPARTMENT_OTHER): Payer: Medicaid Other | Admitting: Physical Therapy

## 2022-01-18 ENCOUNTER — Ambulatory Visit: Payer: Medicaid Other | Admitting: Podiatry

## 2022-01-27 DIAGNOSIS — F314 Bipolar disorder, current episode depressed, severe, without psychotic features: Secondary | ICD-10-CM | POA: Diagnosis not present

## 2022-01-28 ENCOUNTER — Encounter: Payer: Self-pay | Admitting: Podiatry

## 2022-01-28 ENCOUNTER — Ambulatory Visit: Payer: Medicaid Other | Admitting: Podiatry

## 2022-01-28 DIAGNOSIS — M79675 Pain in left toe(s): Secondary | ICD-10-CM

## 2022-01-28 DIAGNOSIS — G6289 Other specified polyneuropathies: Secondary | ICD-10-CM | POA: Diagnosis not present

## 2022-01-28 DIAGNOSIS — E1142 Type 2 diabetes mellitus with diabetic polyneuropathy: Secondary | ICD-10-CM | POA: Diagnosis not present

## 2022-01-28 DIAGNOSIS — M79674 Pain in right toe(s): Secondary | ICD-10-CM

## 2022-01-28 DIAGNOSIS — B351 Tinea unguium: Secondary | ICD-10-CM | POA: Diagnosis not present

## 2022-01-28 DIAGNOSIS — M216X1 Other acquired deformities of right foot: Secondary | ICD-10-CM | POA: Diagnosis not present

## 2022-01-28 DIAGNOSIS — M216X2 Other acquired deformities of left foot: Secondary | ICD-10-CM | POA: Diagnosis not present

## 2022-01-28 DIAGNOSIS — Q828 Other specified congenital malformations of skin: Secondary | ICD-10-CM | POA: Diagnosis not present

## 2022-01-28 NOTE — Progress Notes (Signed)
This patient returns to my office for at risk foot care.  This patient requires this care by a professional since this patient will be at risk due to having diabetes.  This patient is unable to cut nails herself since the patient cannot reach her nails.These nails are painful walking and wearing shoes.  This patient presents for at risk foot care today.  General Appearance  Alert, conversant and in no acute stress.  Vascular  Dorsalis pedis and posterior tibial  pulses are palpable  bilaterally.  Capillary return is within normal limits  bilaterally. Temperature is within normal limits  bilaterally.  Neurologic  Senn-Weinstein monofilament wire test within normal limits  bilaterally. Muscle power within normal limits bilaterally.  Nails Thick disfigured discolored nails with subungual debris  from hallux to fifth toes bilaterally. No evidence of bacterial infection or drainage bilaterally.  Orthopedic  No limitations of motion  feet .  No crepitus or effusions noted.  No bony pathology or digital deformities noted.  Skin  normotropic skin with no porokeratosis noted bilaterally.  No signs of infections or ulcers noted.  Porokeratosis sub 2 left and sub 5  right.   Onychomycosis  Pain in right toes  Pain in left toes Porokeratosis  B/L.  Consent was obtained for treatment procedures.   Mechanical debridement of nails 1-5  bilaterally performed with a nail nipper.  Filed with dremel without incident. Debride callus with # 15 blade followed by dremel tool usage.   Return office visit   12 weeks                  Told patient to return for periodic foot care and evaluation due to potential at risk complications.   Gardiner Barefoot DPM

## 2022-02-09 NOTE — Progress Notes (Deleted)
[facility]  PATIENT: Kendra Griffith DOB: [birth year]  REFERRING CLINICIAN: [name], NP HISTORY FROM: *** REASON FOR VISIT: memory loss   HISTORICAL  CHIEF COMPLAINT:  No chief complaint on file.   HISTORY OF PRESENT ILLNESS:  The patient presents for evaluation of memory loss which has been present since***  TBI: *** No past history of TBI Stroke: *** no past history of stroke Seizures: *** no past history of seizures Sleep: *** no history of sleep apnea.  Has *** never had sleep study.  STOP BANG score *** Mood: *** patient denies anxiety and depression  Functional status: independent in all ** ADLs and IADLs Patient lives with *** in a *** with *** stairs. Cooking: *** Cleaning: *** Shopping: *** Bathing: *** Toileting: *** Driving: *** Bills: *** Medications: *** Ever left the stove on by accident?: *** Forget how to use items around the house?: *** Getting lost going to familiar places?: *** Forgetting loved ones names?: *** Word finding difficulty? *** Sleep: ***  OTHER MEDICAL CONDITIONS: HTN, prediabetes   REVIEW OF SYSTEMS: Full 14 system review of systems performed and negative with exception of: ***  ALLERGIES: Allergies  Allergen Reactions   Amoxicillin     Pain Did it involve swelling of the face/tongue/throat, SOB, or low BP? No Did it involve sudden or severe rash/hives, skin peeling, or any reaction on the inside of your mouth or nose? No Did you need to seek medical attention at a hospital or doctor's office? Yes When did it last happen?      5 years ago If all above answers are "NO", may proceed with cephalosporin use.    Darvocet [Propoxyphene N-Acetaminophen] Nausea And Vomiting    Makes her jittery   Hydrocodone-Acetaminophen Nausea And Vomiting    Upset stomach   Percocet [Oxycodone-Acetaminophen]     Makes her jittery   Valium [Diazepam] Other (See Comments)    Makes pt. Feel "out of wack"     HOME MEDICATIONS: Outpatient Medications Prior to Visit  Medication Sig Dispense Refill   acetaminophen-codeine (TYLENOL #3) 300-30 MG tablet Take 1-2 tablets by mouth every 4 (four) hours as needed for moderate pain. 60 tablet 0   albuterol (VENTOLIN HFA) 108 (90 Base) MCG/ACT inhaler Inhale 2 puffs into the lungs every 6 (six) hours as needed for wheezing or shortness of breath. 18 g 1   amitriptyline (ELAVIL) 75 MG tablet Take 1 tablet (75 mg total) by mouth at bedtime. 90 tablet 1   amLODipine (NORVASC) 10 MG tablet Take 1 tablet (10 mg total) by mouth daily. for blood pressure 90 tablet 1   atorvastatin (LIPITOR) 10 MG tablet Take 1 tablet (10 mg total) by mouth daily. (Patient not taking: Reported on 01/05/2022) 90 tablet 3   Biotin 5 MG CAPS Take 5 mg by mouth 3 (three) times daily.     chlorhexidine (HIBICLENS) 4 % external liquid Apply topically daily as needed. Use when bathing or showering 946 mL 0   cholecalciferol (VITAMIN D3) 25 MCG (1000 UT) tablet Take 1,000 Units by mouth daily.     ezetimibe (ZETIA) 10 MG tablet Take 1 tablet (10 mg total) by mouth daily. 90 tablet 3   famotidine (PEPCID) 20 MG tablet Take 1 tablet (20 mg total) by mouth 2 (two) times daily. 180 tablet 1   ferrous sulfate 324 MG TBEC Take 324 mg by mouth.     gabapentin (NEURONTIN) 300 MG capsule Take 1 capsule (300  mg total) by mouth 2 (two) times daily. 180 capsule 1   GLUTAMINE PO Take 500 mg by mouth.     hydrocortisone (ANUSOL-HC) 2.5 % rectal cream Place 1 application rectally at bedtime. 30 g 1   hydroquinone 4 % cream Apply topically 2 (two) times daily.     hydrOXYzine (ATARAX) 25 MG tablet Take 1 tablet (25 mg total) by mouth 3 (three) times daily as needed. 90 tablet 1   ibuprofen (ADVIL) 800 MG tablet Take 1 tablet (800 mg total) by mouth every 8 (eight) hours as needed. 60 tablet 1   losartan (COZAAR) 25 MG tablet Take 1 tablet (25 mg total) by mouth daily. FOR BLOOD PRESSURE 90 tablet 1    Multiple Vitamin (MULTI-VITAMIN) tablet Take 1 tablet by mouth daily.     nicotine polacrilex (NICORETTE) 2 MG gum Take 1 each (2 mg total) by mouth as needed for smoking cessation. 100 tablet 0   NONFORMULARY OR COMPOUNDED ITEM Peripheral Neuropathy Cream: Bupivacaine 1%, Doxepin 3%, Gabapentin 6%, Pentoxifylline 3%, Topiramate 1% Order faxed to [facility] 1 each 3   Omega-3 Fatty Acids (OMEGA 3 500) 500 MG CAPS Take 500 mg by mouth daily.     omeprazole (PRILOSEC) 20 MG capsule Take 1 capsule (20 mg total) by mouth daily. 90 capsule 1   polyethylene glycol powder (GLYCOLAX/MIRALAX) 17 GM/SCOOP powder Take 17 g by mouth 2 (two) times daily as needed. 3350 g 1   vitamin B-12 (CYANOCOBALAMIN) 500 MCG tablet Take 500 mcg by mouth daily.     vitamin C (ASCORBIC ACID) 500 MG tablet Take 500 mg by mouth daily.     Zinc 50 MG CAPS Take 1 capsule by mouth daily.     No facility-administered medications prior to visit.    PAST MEDICAL HISTORY: Past Medical History:  Diagnosis Date   ABSCESS, TOOTH 08/20/2009   Qualifier: Diagnosis of  By: Jorene Minors, Scott     Anemia    Anxiety    Bipolar disorder ([place])    Blood transfusion without reported diagnosis    childbirth   Chronic pain    feet and back   Clotting disorder ([place])    childbirth   Dental caries    lesion soft palate   Depression    Diabetes mellitus without complication ([place])    prediabetes   GERD (gastroesophageal reflux disease)    Hypertension    Injury    right side from jumping off bunkbed   Pre-diabetes    Sickle cell trait ([place])    Wears glasses     PAST SURGICAL HISTORY: Past Surgical History:  Procedure Laterality Date   BARTHOLIN CYST MARSUPIALIZATION N/A 07/16/2019   Procedure: EXCISION OF BARTHOLIN CYST;  Surgeon: Chancy Milroy, MD;  Location: [facility];  Service: Gynecology;  Laterality: N/A;   CESAREAN SECTION     three   COLONOSCOPY     cyst removal 1997     FACIAL  RECONSTRUCTION SURGERY     FOOT SURGERY Bilateral    LESION REMOVAL Left 10/15/2018   Procedure: Lesion Removal left soft palate;  Surgeon: Diona Browner, DDS;  Location: [facility];  Service: Oral Surgery;  Laterality: Left;   TOOTH EXTRACTION Bilateral 10/15/2018   Procedure: DENTAL EXTRACTIONS OF TEETH NUMBER TWO, FOUR, FIVE, SIX, SEVEN, EIGHT, NINE, TEN, ELEVEN, TWELVE, THIRTEEN, FOURTEEN, SEVENTEEN, TWENTY-ONE, TWENTY-TWO, TWENTY-THREE, TWENTY-FOUR, TWENTY-FIVE, TWENTY-SIX, TWENTY-SEVEN, TWENTY-EIGHT WITH ALVEOLOPLASTY;  Surgeon: Diona Browner, DDS;  Location: [facility];  Service:  Oral Surgery;  Laterality: Bilateral;   TUBAL LIGATION      FAMILY HISTORY: Family History  Problem Relation Age of Onset   Heart disease Mother    Asthma Sister    Diabetes Sister    COPD Sister    Diabetes Sister    Diabetes Sister    Breast cancer Neg Hx    Colon cancer Neg Hx    Esophageal cancer Neg Hx    Pancreatic cancer Neg Hx    Liver disease Neg Hx    Stomach cancer Neg Hx     SOCIAL HISTORY: Social History   Socioeconomic History   Marital status: Single    Spouse name: Not on file   Number of children: 3   Years of education: 8-9   Highest education level: Not on file  Occupational History   Occupation: applying for disability  Tobacco Use   Smoking status: Every Day    Packs/day: 2.00    Years: 29.00    Total pack years: 58.00    Types: Cigarettes   Smokeless tobacco: Never  Vaping Use   Vaping Use: Never used  Substance and Sexual Activity   Alcohol use: No    Alcohol/week: 0.0 standard drinks of alcohol    Comment: Stopped drinking 2013   Drug use: No   Sexual activity: Not Currently    Birth control/protection: None  Other Topics Concern   Not on file  Social History Narrative   Patient lives alone in an apartment on the first floor.  3 children.  Applying for disability.  Education: 8th or 9th grade.   Currently not working   Trying to go back to school online for [employer]    Six Grandchildren   Social Determinants of Health   Financial Resource Strain: Not on file  Food Insecurity: No Food Insecurity (09/04/2019)   Hunger Vital Sign    Worried About Running Out of Food in the Last Year: Never true    [place] in the Last Year: Never true  Transportation Needs: Unmet Transportation Needs (09/04/2019)   PRAPARE - Transportation    Lack of Transportation (Medical): No    Lack of Transportation (Non-Medical): Yes  Physical Activity: Inactive (06/08/2017)   Exercise Vital Sign    Days of Exercise per Week: 0 days    Minutes of Exercise per Session: 0 min  Stress: No Stress Concern Present (06/08/2017)   [place]    Feeling of Stress : Only a little  Social Connections: Socially Isolated (06/08/2017)   Social Connection and Isolation Panel [NHANES]    Frequency of Communication with Friends and Family: Twice a week    Frequency of Social Gatherings with Friends and Family: Never    Attends Religious Services: Never    Marine scientist or Organizations: No    Attends Archivist Meetings: Never    Marital Status: Divorced  Human resources officer Violence: Unknown (06/08/2017)   Humiliation, Afraid, Rape, and Kick questionnaire    Fear of Current or Ex-Partner: Patient refused    Emotionally Abused: Patient refused    Physically Abused: Patient refused    Sexually Abused: Patient refused     PHYSICAL EXAM ***  GENERAL EXAM/CONSTITUTIONAL: Vitals: There were no vitals filed for this visit. There is no height or weight on file to calculate BMI. Wt Readings from Last 3 Encounters:  01/05/22 172 lb (78 kg)  10/04/21 169 lb (76.7  kg)  01/15/21 168 lb (76.2 kg)   Patient is in no distress; well developed, nourished and groomed; neck is supple  CARDIOVASCULAR: Examination of carotid arteries is normal; no carotid bruits Regular rate and rhythm, no murmurs Examination of  peripheral vascular system by observation and palpation is normal  EYES: Pupils round and reactive to light, Visual fields full to confrontation, Extraocular movements intacts,   MUSCULOSKELETAL: Gait, strength, tone, movements noted in Neurologic exam below  NEUROLOGIC: MENTAL STATUS:      No data to display         awake, alert, oriented to person, place and time recent and remote memory intact normal attention and concentration language fluent, comprehension intact, naming intact fund of knowledge appropriate  CRANIAL NERVE:  2nd - no papilledema or hemorrhages on fundoscopic exam 2nd, 3rd, 4th, 6th - pupils equal and reactive to light, visual fields full to confrontation, extraocular muscles intact, no nystagmus 5th - facial sensation symmetric 7th - facial strength symmetric 8th - hearing intact 9th - palate elevates symmetrically, uvula midline 11th - shoulder shrug symmetric 12th - tongue protrusion midline  MOTOR:  normal bulk and tone, no cogwheeling, full strength in the BUE, BLE  SENSORY:  normal and symmetric to light touch, pinprick, temperature, vibration  COORDINATION:  finger-nose-finger, fine finger movements normal, no tremor  REFLEXES:  deep tendon reflexes present and symmetric  GAIT/STATION:  normal     DIAGNOSTIC DATA (LABS, IMAGING, TESTING) - I reviewed patient records, labs, notes, testing and imaging myself where available.  Lab Results  Component Value Date   WBC 9.5 01/07/2020   HGB 12.4 01/07/2020   HCT 36.6 01/07/2020   MCV 84 01/07/2020   PLT 163 01/07/2020      Component Value Date/Time   NA 141 01/05/2022 0919   K 4.3 01/05/2022 0919   CL 103 01/05/2022 0919   CO2 24 01/05/2022 0919   GLUCOSE 128 (H) 01/05/2022 0919   GLUCOSE 105 (H) 07/15/2019 1200   BUN 10 01/05/2022 0919   CREATININE 0.86 01/05/2022 0919   CREATININE 0.83 01/21/2016 1721   CALCIUM 9.8 01/05/2022 0919   PROT 7.1 01/05/2022 0919   ALBUMIN 4.5  01/05/2022 0919   AST 19 01/05/2022 0919   ALT 15 01/05/2022 0919   ALKPHOS 126 (H) 01/05/2022 0919   BILITOT <0.2 01/05/2022 0919   GFRNONAA 70 02/24/2020 1125   GFRNONAA 82 01/21/2016 1721   GFRAA 81 02/24/2020 1125   GFRAA >89 01/21/2016 1721   Lab Results  Component Value Date   CHOL 159 01/05/2022   HDL 65 01/05/2022   LDLCALC 83 01/05/2022   TRIG 56 01/05/2022   CHOLHDL 2.4 01/05/2022   Lab Results  Component Value Date   HGBA1C 6.3 (H) 01/05/2022   Lab Results  Component Value Date   VITAMINB12 1,285 (H) 06/09/2017   Lab Results  Component Value Date   TSH 2.68 06/09/2017    ***    ASSESSMENT AND PLAN  56 y.o. year old female with ***   No diagnosis found.    PLAN: - Labs: CBC, CMP, TSH, B12, RPR  - MRI brain to assess for signs of neurodegeneration and/or significant vascular disease.  - Will place referral for neuropsychological testing to better characterize his/her reported deficits and to establish a cognitive baseline.  - Referral placed for cognitive rehabilitation to help with some of his/her issues.  - Follow up after testing is complete.   No orders of the defined types  were placed in this encounter.   No orders of the defined types were placed in this encounter.   No follow-ups on file.  I spent an average of *** chart reviewing and counseling the patient, with at least 50% of the time face to face with the patient. General brain health measures discussed, including the importance of regular aerobic exercise. Reviewed safety measures including driving safety.   Genia Harold, MD  [facility] [address] [place], [place] [address] [phone]

## 2022-02-09 NOTE — Progress Notes (Deleted)
   S:     KHT:XHFSF Kendra Griffith  56 y.o. female who presents for hypertension evaluation, education, and management. PMH is significant for HTN, pre-diabetes, HLD, chronic pain, and tobacco use.   Patient was referred and last seen by Primary Care Provider, NP [name], on 01/05/22. At that time, BP was elevated at 143/97. Losartan was initiated at that time. She did report some memory loss, leading to her forgetting to take her medications.   Today, patient arrives in *** spirits and presents without *** assistance. *** Denies dizziness, headache, blurred vision, swelling.   Patient reports hypertension was diagnosed in ***.   Family/Social history:  -Fhx:CVD, DM -Tobacco: *** -Alcohol: denies  Medication adherence *** . Patient has *** taken BP medications today.   Current antihypertensives include: amlodipine '10mg'$  once daily, losartan '25mg'$  once daily  Antihypertensives tried in the past include: HCTZ  Reported home BP readings: ***  Patient reported dietary habits: Eats *** meals/day Breakfast: *** Lunch: *** Dinner: *** Snacks: *** Drinks: ***  Patient-reported exercise habits: ***  ASCVD risk factors include: ***  O:  ROS  Physical Exam  Last 3 Office BP readings: BP Readings from Last 3 Encounters:  01/05/22 (!) 143/97  10/04/21 (!) 148/89  01/15/21 (!) 152/93    BMET    Component Value Date/Time   NA 141 01/05/2022 0919   K 4.3 01/05/2022 0919   CL 103 01/05/2022 0919   CO2 24 01/05/2022 0919   GLUCOSE 128 (H) 01/05/2022 0919   GLUCOSE 105 (H) 07/15/2019 1200   BUN 10 01/05/2022 0919   CREATININE 0.86 01/05/2022 0919   CREATININE 0.83 01/21/2016 1721   CALCIUM 9.8 01/05/2022 0919   GFRNONAA 70 02/24/2020 1125   GFRNONAA 82 01/21/2016 1721   GFRAA 81 02/24/2020 1125   GFRAA >89 01/21/2016 1721    Renal function: CrCl cannot be calculated (Patient's most recent lab result is older than the maximum 21 days allowed.).  Clinical ASCVD: No  The  10-year ASCVD risk score (Arnett DK, et al., 2019) is: 23%   Values used to calculate the score:     Age: 53 years     Sex: Female     Is Non-Hispanic African American: Yes     Diabetic: Yes     Tobacco smoker: Yes     Systolic Blood Pressure: 423 mmHg     Is BP treated: Yes     HDL Cholesterol: 65 mg/dL     Total Cholesterol: 159 mg/dL  Patient is participating in a Managed Medicaid Plan:  Yes    A/P: Hypertension diagnosed *** currently *** on current medications. BP goal < 130/80 *** mmHg. Medication adherence appears ***. Control is suboptimal due to ***.  -{Meds adjust:18428} ***.  -Patient educated on purpose, proper use, and potential adverse effects of ***.  -F/u labs ordered - *** -Counseled on lifestyle modifications for blood pressure control including reduced dietary sodium, increased exercise, adequate sleep. -Encouraged patient to check BP at home and bring log of readings to next visit. Counseled on proper use of home BP cuff.    Results reviewed and written information provided.    Written patient instructions provided. Patient verbalized understanding of treatment plan.  Total time in face to face counseling *** minutes.    Follow-up:  Pharmacist ***. PCP clinic visit in December  Maryan Puls, PharmD PGY-1 [facility] Resident

## 2022-02-10 ENCOUNTER — Ambulatory Visit: Payer: Medicaid Other | Admitting: Pharmacist

## 2022-02-10 ENCOUNTER — Ambulatory Visit: Payer: Medicaid Other | Admitting: Psychiatry

## 2022-02-10 ENCOUNTER — Encounter: Payer: Self-pay | Admitting: Psychiatry

## 2022-03-15 ENCOUNTER — Other Ambulatory Visit: Payer: Self-pay | Admitting: Nurse Practitioner

## 2022-03-15 DIAGNOSIS — F32A Depression, unspecified: Secondary | ICD-10-CM

## 2022-03-15 DIAGNOSIS — G894 Chronic pain syndrome: Secondary | ICD-10-CM

## 2022-03-15 MED ORDER — GABAPENTIN 300 MG PO CAPS
300.0000 mg | ORAL_CAPSULE | Freq: Two times a day (BID) | ORAL | 3 refills | Status: DC
  Filled 2022-09-26: qty 180, 90d supply, fill #0
  Filled 2023-03-06: qty 180, 90d supply, fill #1

## 2022-03-15 NOTE — Telephone Encounter (Signed)
Requested Prescriptions  Pending Prescriptions Disp Refills   gabapentin (NEURONTIN) 300 MG capsule 180 capsule 3    Sig: Take 1 capsule (300 mg total) by mouth 2 (two) times daily.     Neurology: Anticonvulsants - gabapentin Passed - 03/15/2022  4:22 PM      Passed - Cr in normal range and within 360 days    Creat  Date Value Ref Range Status  01/21/2016 0.83 0.50 - 1.05 mg/dL Final    Comment:      For patients > or = 56 years of age: The upper reference limit for Creatinine is approximately 13% higher for people identified as African-American.      Creatinine, Ser  Date Value Ref Range Status  01/05/2022 0.86 0.57 - 1.00 mg/dL Final   Creatinine,U  Date Value Ref Range Status  07/30/2009 73.1 mg/dL Final    Comment:    See lab report for associated comment(s)   Creatinine, Urine  Date Value Ref Range Status  01/21/2016 166 20 - 320 mg/dL Final         Passed - Completed PHQ-2 or PHQ-9 in the last 360 days      Passed - Valid encounter within last 12 months    Recent Outpatient Visits           2 months ago Primary hypertension   [place], [place] W, NP   5 months ago Type 2 diabetes mellitus with diabetic polyneuropathy, without long-term current use of insulin [facility])   [place], [place] W, NP   6 months ago Chronic bilateral low back pain without sciatica   [place], Vernia Buff, NP   1 year ago Encounter for Papanicolaou smear for cervical cancer screening   [place], Vernia Buff, NP   1 year ago Rectal abnormality   [place], Vernia Buff, NP       Future Appointments             In 2 days Daisy Blossom, Jarome Matin, [place]   In 3 weeks [name], NP [place]

## 2022-03-15 NOTE — Telephone Encounter (Signed)
Medication Refill - Medication:  gabapentin (NEURONTIN) 300 MG capsule   Has the patient contacted their pharmacy? No.  Preferred Pharmacy (with phone number or street name):  [id number] - [place], [address] Phone: [phone]  Fax: [phone]     Has the patient been seen for an appointment in the last year OR does the patient have an upcoming appointment? Yes.    Agent: Please be advised that RX refills may take up to 3 business days. We ask that you follow-up with your pharmacy.

## 2022-03-17 ENCOUNTER — Ambulatory Visit: Payer: Medicaid Other | Attending: Nurse Practitioner | Admitting: Pharmacist

## 2022-03-17 ENCOUNTER — Encounter: Payer: Self-pay | Admitting: Pharmacist

## 2022-03-17 VITALS — BP 127/82 | HR 69

## 2022-03-17 DIAGNOSIS — I10 Essential (primary) hypertension: Secondary | ICD-10-CM

## 2022-03-17 MED ORDER — ROSUVASTATIN CALCIUM 5 MG PO TABS: 5.0000 mg | ORAL_TABLET | Freq: Every day | ORAL | 1 refills | Status: DC

## 2022-03-17 NOTE — Progress Notes (Signed)
S:     No chief complaint on file.  56 y.o. female who presents for hypertension evaluation, education, and management.  PMH is significant for HTN, T2DM, neuropathy, former alcohol abuse, mixed hyperlipidemia. Patient was referred and last seen by Primary Care Provider, [name], on 01/05/2022. BP was 143/97 mmHg. Zelda added losartan.  Today, patient arrives in good spirits and presents without assistance. Denies dizziness, headache, blurred vision, swelling. She has no complaints today but does wonder if there is another statin she can take that doesn't cause side effects.   Patient reports hypertension is longstanding.   Family/Social history:  Fhx: heart disease, DM, asthma/COPD Tobacco: 1-2 packs per day Alcohol: none currently   Medication adherence: unfortunately, she stopped the amlodipine and has only been taking losartan. She didn't know she was to take both.  Current antihypertensives include: amlodipine 10 mg daily, losartan 25 mg daily   Reported home BP readings: hasn't checked lately.   Patient reported dietary habits:  -Sodium consumption: occasionally eats salt.  -Drinks coffee (4-5 cups daily)  Patient-reported exercise habits:  -Was working out regularly. Hasn't been to the gym in the last 2 months d/t life stressors.   O:  Vitals:   03/17/22 1518  BP: 127/82  Pulse: 69     Last 3 Office BP readings: BP Readings from Last 3 Encounters:  03/17/22 127/82  01/05/22 (!) 143/97  10/04/21 (!) 148/89    BMET    Component Value Date/Time   NA 141 01/05/2022 0919   K 4.3 01/05/2022 0919   CL 103 01/05/2022 0919   CO2 24 01/05/2022 0919   GLUCOSE 128 (H) 01/05/2022 0919   GLUCOSE 105 (H) 07/15/2019 1200   BUN 10 01/05/2022 0919   CREATININE 0.86 01/05/2022 0919   CREATININE 0.83 01/21/2016 1721   CALCIUM 9.8 01/05/2022 0919   GFRNONAA 70 02/24/2020 1125   GFRNONAA 82 01/21/2016 1721   GFRAA 81 02/24/2020 1125   GFRAA >89 01/21/2016 1721     Renal function: CrCl cannot be calculated (Patient's most recent lab result is older than the maximum 21 days allowed.).  Clinical ASCVD: No  The 10-year ASCVD risk score (Arnett DK, et al., 2019) is: 16%   Values used to calculate the score:     Age: 79 years     Sex: Female     Is Non-Hispanic African American: Yes     Diabetic: Yes     Tobacco smoker: Yes     Systolic Blood Pressure: 818 mmHg     Is BP treated: Yes     HDL Cholesterol: 65 mg/dL     Total Cholesterol: 159 mg/dL  Patient is participating in a Managed Medicaid Plan:  Yes    A/P: Hypertension longstanding currently at goal on current medications. BP goal < 130/80 mmHg. Medication adherence appears suboptimal. Will have her take losartan and amlodipine as prescribed.  -Continued current medication regimen. Encouraged adherence. -Will change atorvastatin to rosuvastatin 5 mg daily to see if she can tolerate this.   -Patient educated on purpose, proper use, and potential adverse effects of rosuvastatin.  -F/u labs ordered - BMP8+eGFR -Counseled on lifestyle modifications for blood pressure control including reduced dietary sodium, increased exercise, adequate sleep. -Encouraged patient to check BP at home and bring log of readings to next visit. Counseled on proper use of home BP cuff.    Results reviewed and written information provided.    Written patient instructions provided. Patient verbalized understanding of treatment  plan.  Total time in face to face counseling 20 minutes.    Follow-up:  Pharmacist prn. PCP clinic visit at the end of this month.  Benard Halsted, PharmD, [place], [place] [phone]

## 2022-03-18 LAB — BMP8+EGFR
BUN/Creatinine Ratio: 11 (ref 9–23)
BUN: 9 mg/dL (ref 6–24)
CO2: 21 mmol/L (ref 20–29)
Calcium: 9.8 mg/dL (ref 8.7–10.2)
Chloride: 105 mmol/L (ref 96–106)
Creatinine, Ser: 0.84 mg/dL (ref 0.57–1.00)
Glucose: 91 mg/dL (ref 70–99)
Potassium: 4.5 mmol/L (ref 3.5–5.2)
Sodium: 144 mmol/L (ref 134–144)
eGFR: 82 mL/min/1.73

## 2022-03-21 NOTE — Progress Notes (Unsigned)
[facility]  PATIENT: Kendra Griffith DOB: 06/24/1965  REFERRING CLINICIAN: [name], NP HISTORY FROM: self REASON FOR VISIT: memory loss   HISTORICAL  CHIEF COMPLAINT:  Chief Complaint  Patient presents with   Memory Loss    Rm 1 alone Pt is well, has been having short term memory concerns for about 7-8 yrs.     HISTORY OF PRESENT ILLNESS:  The patient presents for evaluation of memory loss which has been present over the past 7-8 years. Does not feel like her memory has been worsening over time. She will take on too many tasks at once and will have trouble keeping track. Will miss doses of her medications and occasionally forget to pay bills. Has left the stove on and forgotten about it. She is not driving.  She saw Neurology in 2019 for generalized pain and memory issues. Her memory changes at that time were suspected to be secondary to her mood disorder (she is bipolar) and history of alcohol abuse.   Notes her sister has a history of brain tumors requiring multiple surgeries.  TBI:  No past history of TBI Stroke:  no past history of stroke Seizures: no past history of seizures Sleep: No history of sleep apnea.  Has never had sleep study.  She does not sleep well at night. Notes that she drinks a lot of caffeine and forgets to take her amitriptyline at night. Mood: She has a history of anxiety and bipolar disorder. She is under a lot of stress right now as she is under the process of moving and has had some deaths in the family. Takes amitriptyline for mood management and insomnia  Functional status:  Patient lives alone Cooking: Has left the stove on and forgotten about it Cleaning: no issues Shopping: does her own shopping Driving: She is not driving Bills: Occasionally forgets to pay bills Medications: Will forget doses of her medications Ever left the stove on by accident?: yes Forget how to use items around the house?: no Getting lost  going to familiar places?: no Forgetting loved ones names?: no Word finding difficulty? yes  OTHER MEDICAL CONDITIONS: HTN, anxiety, bipolar disorder, GERD, DM2, HLD, remote alcohol abuse, vitamin B12 deficiency   REVIEW OF SYSTEMS: Full 14 system review of systems performed and negative with exception of: memory loss  ALLERGIES: Allergies  Allergen Reactions   Amoxicillin     Pain Did it involve swelling of the face/tongue/throat, SOB, or low BP? No Did it involve sudden or severe rash/hives, skin peeling, or any reaction on the inside of your mouth or nose? No Did you need to seek medical attention at a hospital or doctor's office? Yes When did it last happen?      5 years ago If all above answers are "NO", may proceed with cephalosporin use.    Darvocet [Propoxyphene N-Acetaminophen] Nausea And Vomiting    Makes her jittery   Hydrocodone-Acetaminophen Nausea And Vomiting    Upset stomach   Percocet [Oxycodone-Acetaminophen]     Makes her jittery   Valium [Diazepam] Other (See Comments)    Makes pt. Feel "out of wack"    HOME MEDICATIONS: Outpatient Medications Prior to Visit  Medication Sig Dispense Refill   acetaminophen-codeine (TYLENOL #3) 300-30 MG tablet Take 1-2 tablets by mouth every 4 (four) hours as needed for moderate pain. 60 tablet 0   albuterol (VENTOLIN HFA) 108 (90 Base) MCG/ACT inhaler Inhale 2 puffs into the lungs every 6 (six) hours as  needed for wheezing or shortness of breath. 18 g 1   amitriptyline (ELAVIL) 75 MG tablet Take 1 tablet (75 mg total) by mouth at bedtime. 90 tablet 1   amLODipine (NORVASC) 10 MG tablet Take 1 tablet (10 mg total) by mouth daily. for blood pressure 90 tablet 1   Biotin 5 MG CAPS Take 5 mg by mouth 3 (three) times daily.     chlorhexidine (HIBICLENS) 4 % external liquid Apply topically daily as needed. Use when bathing or showering 946 mL 0   cholecalciferol (VITAMIN D3) 25 MCG (1000 UT) tablet Take 1,000 Units by mouth daily.      famotidine (PEPCID) 20 MG tablet Take 1 tablet (20 mg total) by mouth 2 (two) times daily. 180 tablet 1   ferrous sulfate 324 MG TBEC Take 324 mg by mouth.     gabapentin (NEURONTIN) 300 MG capsule Take 1 capsule (300 mg total) by mouth 2 (two) times daily. 180 capsule 3   GLUTAMINE PO Take 500 mg by mouth.     hydrocortisone (ANUSOL-HC) 2.5 % rectal cream Place 1 application rectally at bedtime. 30 g 1   hydroquinone 4 % cream Apply topically 2 (two) times daily.     hydrOXYzine (ATARAX) 25 MG tablet Take 1 tablet (25 mg total) by mouth 3 (three) times daily as needed. 90 tablet 1   ibuprofen (ADVIL) 800 MG tablet Take 1 tablet (800 mg total) by mouth every 8 (eight) hours as needed. 60 tablet 1   losartan (COZAAR) 25 MG tablet Take 1 tablet (25 mg total) by mouth daily. FOR BLOOD PRESSURE 90 tablet 1   Multiple Vitamin (MULTI-VITAMIN) tablet Take 1 tablet by mouth daily.     nicotine polacrilex (NICORETTE) 2 MG gum Take 1 each (2 mg total) by mouth as needed for smoking cessation. 100 tablet 0   NONFORMULARY OR COMPOUNDED ITEM Peripheral Neuropathy Cream: Bupivacaine 1%, Doxepin 3%, Gabapentin 6%, Pentoxifylline 3%, Topiramate 1% Order faxed to [facility] 1 each 3   Omega-3 Fatty Acids (OMEGA 3 500) 500 MG CAPS Take 500 mg by mouth daily.     omeprazole (PRILOSEC) 20 MG capsule Take 1 capsule (20 mg total) by mouth daily. 90 capsule 1   polyethylene glycol powder (GLYCOLAX/MIRALAX) 17 GM/SCOOP powder Take 17 g by mouth 2 (two) times daily as needed. 3350 g 1   rosuvastatin (CRESTOR) 5 MG tablet Take 1 tablet (5 mg total) by mouth daily. 90 tablet 1   vitamin B-12 (CYANOCOBALAMIN) 500 MCG tablet Take 500 mcg by mouth daily.     vitamin C (ASCORBIC ACID) 500 MG tablet Take 500 mg by mouth daily.     Zinc 50 MG CAPS Take 1 capsule by mouth daily.     ezetimibe (ZETIA) 10 MG tablet Take 1 tablet (10 mg total) by mouth daily. 90 tablet 3   No facility-administered medications prior  to visit.    PAST MEDICAL HISTORY: Past Medical History:  Diagnosis Date   ABSCESS, TOOTH 08/20/2009   Qualifier: Diagnosis of  By: Jorene Minors, Scott     Anemia    Anxiety    Bipolar disorder ([place])    Blood transfusion without reported diagnosis    childbirth   Chronic pain    feet and back   Clotting disorder ([place])    childbirth   Dental caries    lesion soft palate   Depression    Diabetes mellitus without complication ([place])    prediabetes   GERD (  gastroesophageal reflux disease)    Hypertension    Injury    right side from jumping off bunkbed   Pre-diabetes    Sickle cell trait ([place])    Wears glasses     PAST SURGICAL HISTORY: Past Surgical History:  Procedure Laterality Date   BARTHOLIN CYST MARSUPIALIZATION N/A 07/16/2019   Procedure: EXCISION OF BARTHOLIN CYST;  Surgeon: Chancy Milroy, MD;  Location: [facility];  Service: Gynecology;  Laterality: N/A;   CESAREAN SECTION     three   COLONOSCOPY     cyst removal 1997     FACIAL RECONSTRUCTION SURGERY     FOOT SURGERY Bilateral    LESION REMOVAL Left 10/15/2018   Procedure: Lesion Removal left soft palate;  Surgeon: Diona Browner, DDS;  Location: [facility];  Service: Oral Surgery;  Laterality: Left;   TOOTH EXTRACTION Bilateral 10/15/2018   Procedure: DENTAL EXTRACTIONS OF TEETH NUMBER TWO, FOUR, FIVE, SIX, SEVEN, EIGHT, NINE, TEN, ELEVEN, TWELVE, THIRTEEN, FOURTEEN, SEVENTEEN, TWENTY-ONE, TWENTY-TWO, TWENTY-THREE, TWENTY-FOUR, TWENTY-FIVE, TWENTY-SIX, TWENTY-SEVEN, TWENTY-EIGHT WITH ALVEOLOPLASTY;  Surgeon: Diona Browner, DDS;  Location: [facility];  Service: Oral Surgery;  Laterality: Bilateral;   TUBAL LIGATION      FAMILY HISTORY: Family History  Problem Relation Age of Onset   Heart disease Mother    Asthma Sister    Diabetes Sister    COPD Sister    Diabetes Sister    Diabetes Sister    Breast cancer Neg Hx    Colon cancer Neg Hx    Esophageal cancer Neg Hx    Pancreatic cancer Neg Hx    Liver  disease Neg Hx    Stomach cancer Neg Hx     SOCIAL HISTORY: Social History   Socioeconomic History   Marital status: Single    Spouse name: Not on file   Number of children: 3   Years of education: 8-9   Highest education level: Not on file  Occupational History   Occupation: applying for disability  Tobacco Use   Smoking status: Every Day    Packs/day: 2.00    Years: 29.00    Total pack years: 58.00    Types: Cigarettes   Smokeless tobacco: Never  Vaping Use   Vaping Use: Never used  Substance and Sexual Activity   Alcohol use: No    Alcohol/week: 0.0 standard drinks of alcohol    Comment: Stopped drinking 2013   Drug use: No   Sexual activity: Not Currently    Birth control/protection: None  Other Topics Concern   Not on file  Social History Narrative   Patient lives alone in an apartment on the first floor.  3 children.  Applying for disability.  Education: 8th or 9th grade.   Currently not working   Trying to go back to school online for [employer]   Six Grandchildren   Social Determinants of Health   Financial Resource Strain: Not on file  Food Insecurity: No Food Insecurity (09/04/2019)   Hunger Vital Sign    Worried About Running Out of Food in the Last Year: Never true    [place] in the Last Year: Never true  Transportation Needs: Unmet Transportation Needs (09/04/2019)   PRAPARE - Transportation    Lack of Transportation (Medical): No    Lack of Transportation (Non-Medical): Yes  Physical Activity: Inactive (06/08/2017)   Exercise Vital Sign    Days of Exercise per Week: 0 days    Minutes of Exercise per Session:  0 min  Stress: No Stress Concern Present (06/08/2017)   [place]    Feeling of Stress : Only a little  Social Connections: Socially Isolated (06/08/2017)   Social Connection and Isolation Panel [NHANES]    Frequency of Communication with Friends and Family: Twice a week     Frequency of Social Gatherings with Friends and Family: Never    Attends Religious Services: Never    Marine scientist or Organizations: No    Attends Archivist Meetings: Never    Marital Status: Divorced  Human resources officer Violence: Unknown (06/08/2017)   Humiliation, Afraid, Rape, and Kick questionnaire    Fear of Current or Ex-Partner: Patient refused    Emotionally Abused: Patient refused    Physically Abused: Patient refused    Sexually Abused: Patient refused     PHYSICAL EXAM  GENERAL EXAM/CONSTITUTIONAL: Vitals:  Vitals:   03/22/22 1016  BP: (!) 146/101  Pulse: 86  Weight: 171 lb (77.6 kg)  Height: '5\' 5"'$  (1.651 m)   Body mass index is 28.46 kg/m. Wt Readings from Last 3 Encounters:  03/22/22 171 lb (77.6 kg)  01/05/22 172 lb (78 kg)  10/04/21 169 lb (76.7 kg)    NEUROLOGIC: MENTAL STATUS:     03/22/2022   10:21 AM  Montreal Cognitive Assessment   Visuospatial/ Executive (0/5) 3  Naming (0/3) 3  Attention: Read list of digits (0/2) 1  Attention: Read list of letters (0/1) 1  Attention: Serial 7 subtraction starting at 100 (0/3) 0  Language: Repeat phrase (0/2) 0  Language : Fluency (0/1) 1  Abstraction (0/2) 0  Delayed Recall (0/5) 2  Orientation (0/6) 6  Total 17    CRANIAL NERVE:  2nd, 3rd, 4th, 6th - pupils equal and reactive to light, visual fields full to confrontation, extraocular muscles intact, no nystagmus 5th - facial sensation symmetric 7th - facial strength symmetric 8th - hearing intact 9th - palate elevates symmetrically, uvula midline 11th - shoulder shrug symmetric 12th - tongue protrusion midline  MOTOR:  normal bulk and tone, no cogwheeling, full strength in the BUE, BLE  SENSORY:  normal and symmetric to light touch all 4 extremities  COORDINATION:  finger-nose-finger, fine finger movements normal, no tremor  REFLEXES:  deep tendon reflexes present and symmetric  GAIT/STATION:   normal     DIAGNOSTIC DATA (LABS, IMAGING, TESTING) - I reviewed patient records, labs, notes, testing and imaging myself where available.  Lab Results  Component Value Date   WBC 9.5 01/07/2020   HGB 12.4 01/07/2020   HCT 36.6 01/07/2020   MCV 84 01/07/2020   PLT 163 01/07/2020      Component Value Date/Time   NA 144 03/17/2022 1527   K 4.5 03/17/2022 1527   CL 105 03/17/2022 1527   CO2 21 03/17/2022 1527   GLUCOSE 91 03/17/2022 1527   GLUCOSE 105 (H) 07/15/2019 1200   BUN 9 03/17/2022 1527   CREATININE 0.84 03/17/2022 1527   CREATININE 0.83 01/21/2016 1721   CALCIUM 9.8 03/17/2022 1527   PROT 7.1 01/05/2022 0919   ALBUMIN 4.5 01/05/2022 0919   AST 19 01/05/2022 0919   ALT 15 01/05/2022 0919   ALKPHOS 126 (H) 01/05/2022 0919   BILITOT <0.2 01/05/2022 0919   GFRNONAA 70 02/24/2020 1125   GFRNONAA 82 01/21/2016 1721   GFRAA 81 02/24/2020 1125   GFRAA >89 01/21/2016 1721   Lab Results  Component Value Date  CHOL 159 01/05/2022   HDL 65 01/05/2022   LDLCALC 83 01/05/2022   TRIG 56 01/05/2022   CHOLHDL 2.4 01/05/2022   Lab Results  Component Value Date   HGBA1C 6.3 (H) 01/05/2022   Lab Results  Component Value Date   VITAMINB12 1,285 (H) 06/09/2017   Lab Results  Component Value Date   TSH 2.68 06/09/2017    ASSESSMENT AND PLAN  56 y.o. year old female with a history of HTN, anxiety, depression, GERD, DM2, HLD, remote alcohol abuse who presents for evaluation of memory loss over the past 7-8 years. MOCA score today is 17/30. Will check blood work for common causes of memory issues and order MRI brain to assess for structural causes of memory loss. Referral to neuropsychology sent to better characterize her memory deficits and help differentiate if these are secondary to her mood disorder or a neurodegenerative process.   1. Memory loss       PLAN: - Labs: TSH, B12  - MRI brain  - Referral to neuropsychology  Orders Placed This Encounter   Procedures   MR BRAIN W WO CONTRAST   TSH   Vitamin B12   Ambulatory referral to Neuropsychology    No orders of the defined types were placed in this encounter.   Return in about 6 months (around 09/21/2022).  I spent an average of 36 minutes chart reviewing and counseling the patient, with at least 50% of the time face to face with the patient.   Genia Harold, MD 03/22/22 10:55 AM  [facility] [address], [place], [place] [address] [phone]

## 2022-03-22 ENCOUNTER — Encounter: Payer: Self-pay | Admitting: Psychiatry

## 2022-03-22 ENCOUNTER — Ambulatory Visit: Payer: Medicaid Other | Admitting: Psychiatry

## 2022-03-22 ENCOUNTER — Telehealth: Payer: Self-pay | Admitting: Psychiatry

## 2022-03-22 VITALS — BP 146/101 | HR 86 | Ht 65.0 in | Wt 171.0 lb

## 2022-03-22 DIAGNOSIS — R413 Other amnesia: Secondary | ICD-10-CM

## 2022-03-22 NOTE — Patient Instructions (Signed)
Plan: -Blood tests to check B12 and thyroid levels -MRI brain -Referral to neuropsychology for further memory testing

## 2022-03-22 NOTE — Telephone Encounter (Signed)
Referral Neuropsychology sent through [facility] to Dr. Ilean Skill. Phone: [phone]

## 2022-03-23 LAB — TSH: TSH: 1.72 u[IU]/mL (ref 0.450–4.500)

## 2022-03-23 LAB — VITAMIN B12: Vitamin B-12: 1441 pg/mL — ABNORMAL HIGH (ref 232–1245)

## 2022-03-25 ENCOUNTER — Encounter: Payer: Self-pay | Admitting: Psychology

## 2022-03-28 ENCOUNTER — Other Ambulatory Visit (HOSPITAL_COMMUNITY): Payer: Self-pay | Admitting: Family Medicine

## 2022-03-28 DIAGNOSIS — R079 Chest pain, unspecified: Secondary | ICD-10-CM

## 2022-03-29 ENCOUNTER — Telehealth: Payer: Self-pay | Admitting: Psychiatry

## 2022-03-29 NOTE — Telephone Encounter (Signed)
[employer] medicaid Josem Kaufmann: [id number] exp. 03/29/22-05/13/22 sent to GI [phone]

## 2022-04-06 ENCOUNTER — Ambulatory Visit (HOSPITAL_COMMUNITY): Admission: RE | Admit: 2022-04-06 | Payer: Medicaid Other | Source: Ambulatory Visit

## 2022-04-06 ENCOUNTER — Ambulatory Visit: Payer: Medicaid Other | Admitting: Nurse Practitioner

## 2022-05-02 ENCOUNTER — Ambulatory Visit (INDEPENDENT_AMBULATORY_CARE_PROVIDER_SITE_OTHER): Payer: Medicaid Other | Admitting: Podiatry

## 2022-05-02 ENCOUNTER — Encounter: Payer: Self-pay | Admitting: Podiatry

## 2022-05-02 DIAGNOSIS — B351 Tinea unguium: Secondary | ICD-10-CM | POA: Diagnosis not present

## 2022-05-02 DIAGNOSIS — M216X2 Other acquired deformities of left foot: Secondary | ICD-10-CM

## 2022-05-02 DIAGNOSIS — M79674 Pain in right toe(s): Secondary | ICD-10-CM

## 2022-05-02 DIAGNOSIS — M216X1 Other acquired deformities of right foot: Secondary | ICD-10-CM

## 2022-05-02 DIAGNOSIS — E1142 Type 2 diabetes mellitus with diabetic polyneuropathy: Secondary | ICD-10-CM | POA: Diagnosis not present

## 2022-05-02 DIAGNOSIS — Q828 Other specified congenital malformations of skin: Secondary | ICD-10-CM

## 2022-05-02 DIAGNOSIS — M79675 Pain in left toe(s): Secondary | ICD-10-CM

## 2022-05-02 NOTE — Progress Notes (Signed)
This patient returns to my office for at risk foot care.  This patient requires this care by a professional since this patient will be at risk due to having diabetes.  This patient is unable to cut nails herself since the patient cannot reach her nails.These nails are painful walking and wearing shoes.  This patient presents for at risk foot care today.  General Appearance  Alert, conversant and in no acute stress.  Vascular  Dorsalis pedis and posterior tibial  pulses are palpable  bilaterally.  Capillary return is within normal limits  bilaterally. Temperature is within normal limits  bilaterally.  Neurologic  Senn-Weinstein monofilament wire test within normal limits  bilaterally. Muscle power within normal limits bilaterally.  Nails Thick disfigured discolored nails with subungual debris  from hallux to fifth toes bilaterally. No evidence of bacterial infection or drainage bilaterally.  Orthopedic  No limitations of motion  feet .  No crepitus or effusions noted.  No bony pathology or digital deformities noted.  Skin  normotropic skin with no porokeratosis noted bilaterally.  No signs of infections or ulcers noted.  Porokeratosis sub 2 right and sub 5 B/L.  Onychomycosis  Pain in right toes  Pain in left toes Porokeratosis  B/L.  Consent was obtained for treatment procedures.   Mechanical debridement of nails 1-5  bilaterally performed with a nail nipper.  Filed with dremel without incident. Debride callus with # 15 blade followed by dremel tool usage.  Callus done as courtesy.   Return office visit   12 weeks                  Told patient to return for periodic foot care and evaluation due to potential at risk complications.   Gardiner Barefoot DPM

## 2022-05-02 NOTE — Addendum Note (Signed)
Addended by: Gardiner Barefoot on: 05/02/2022 09:44 AM   Modules accepted: Level of Service

## 2022-05-05 ENCOUNTER — Ambulatory Visit
Admission: RE | Admit: 2022-05-05 | Discharge: 2022-05-05 | Disposition: A | Discharge status: Home / Self Care | Payer: Medicaid Other | Source: Ambulatory Visit | Attending: Psychiatry | Admitting: Psychiatry

## 2022-05-05 DIAGNOSIS — R413 Other amnesia: Secondary | ICD-10-CM

## 2022-05-05 MED ORDER — GADOPICLENOL 0.5 MMOL/ML IV SOLN
7.5000 mL | Freq: Once | INTRAVENOUS | Status: AC | PRN
  Administered 2022-05-05: 7.5 mL via INTRAVENOUS

## 2022-05-11 ENCOUNTER — Encounter (HOSPITAL_COMMUNITY): Payer: Self-pay

## 2022-05-11 ENCOUNTER — Other Ambulatory Visit (HOSPITAL_COMMUNITY): Payer: Self-pay | Admitting: *Deleted

## 2022-05-11 MED ORDER — METOPROLOL TARTRATE 100 MG PO TABS: ORAL_TABLET | ORAL | 0 refills | Status: DC

## 2022-05-13 ENCOUNTER — Telehealth (HOSPITAL_COMMUNITY): Payer: Self-pay | Admitting: *Deleted

## 2022-05-13 NOTE — Telephone Encounter (Signed)
Attempted to call patient regarding upcoming cardiac CT appointment. °Left message on voicemail with name and callback number ° °Marquavious Nazar RN Navigator Cardiac Imaging °[facility] °[phone] Office °[phone] Cell ° °

## 2022-05-16 ENCOUNTER — Ambulatory Visit (HOSPITAL_COMMUNITY)
Admission: RE | Admit: 2022-05-16 | Discharge: 2022-05-16 | Disposition: A | Discharge status: Home / Self Care | Payer: Medicaid Other | Source: Ambulatory Visit | Attending: Family Medicine | Admitting: Family Medicine

## 2022-05-16 DIAGNOSIS — R079 Chest pain, unspecified: Secondary | ICD-10-CM | POA: Insufficient documentation

## 2022-05-16 MED ORDER — METOPROLOL TARTRATE 5 MG/5ML IV SOLN
5.0000 mg | Freq: Once | INTRAVENOUS | Status: AC
  Administered 2022-05-16: 5 mg via INTRAVENOUS

## 2022-05-16 MED ORDER — NITROGLYCERIN 0.4 MG SL SUBL
0.8000 mg | SUBLINGUAL_TABLET | Freq: Once | SUBLINGUAL | Status: AC
  Administered 2022-05-16: 0.8 mg via SUBLINGUAL

## 2022-05-16 MED ORDER — IOHEXOL 350 MG/ML SOLN
100.0000 mL | Freq: Once | INTRAVENOUS | Status: AC | PRN
  Administered 2022-05-16: 100 mL via INTRAVENOUS

## 2022-05-16 MED ORDER — NITROGLYCERIN 0.4 MG SL SUBL
SUBLINGUAL_TABLET | SUBLINGUAL | Status: AC
  Filled 2022-05-16: qty 2

## 2022-05-16 MED ORDER — METOPROLOL TARTRATE 5 MG/5ML IV SOLN
INTRAVENOUS | Status: AC
  Filled 2022-05-16: qty 5

## 2022-05-18 ENCOUNTER — Encounter: Payer: Self-pay | Admitting: Nurse Practitioner

## 2022-05-18 ENCOUNTER — Ambulatory Visit: Payer: Medicaid Other | Attending: Nurse Practitioner | Admitting: Nurse Practitioner

## 2022-05-18 VITALS — BP 129/86 | HR 86 | Ht 65.0 in | Wt 169.0 lb

## 2022-05-18 DIAGNOSIS — F17210 Nicotine dependence, cigarettes, uncomplicated: Secondary | ICD-10-CM

## 2022-05-18 DIAGNOSIS — R3911 Hesitancy of micturition: Secondary | ICD-10-CM | POA: Diagnosis not present

## 2022-05-18 DIAGNOSIS — E1165 Type 2 diabetes mellitus with hyperglycemia: Secondary | ICD-10-CM | POA: Diagnosis not present

## 2022-05-18 DIAGNOSIS — F419 Anxiety disorder, unspecified: Secondary | ICD-10-CM

## 2022-05-18 DIAGNOSIS — F1994 Other psychoactive substance use, unspecified with psychoactive substance-induced mood disorder: Secondary | ICD-10-CM | POA: Insufficient documentation

## 2022-05-18 DIAGNOSIS — N393 Stress incontinence (female) (male): Secondary | ICD-10-CM | POA: Diagnosis not present

## 2022-05-18 DIAGNOSIS — I10 Essential (primary) hypertension: Secondary | ICD-10-CM

## 2022-05-18 DIAGNOSIS — Z720 Tobacco use: Secondary | ICD-10-CM

## 2022-05-18 DIAGNOSIS — Z23 Encounter for immunization: Secondary | ICD-10-CM

## 2022-05-18 DIAGNOSIS — G621 Alcoholic polyneuropathy: Secondary | ICD-10-CM | POA: Diagnosis not present

## 2022-05-18 DIAGNOSIS — Z130 Encounter for screening for diseases of the blood and blood-forming organs and certain disorders involving the immune mechanism: Secondary | ICD-10-CM | POA: Diagnosis not present

## 2022-05-18 DIAGNOSIS — R7303 Prediabetes: Secondary | ICD-10-CM | POA: Diagnosis not present

## 2022-05-18 MED ORDER — MISC. DEVICES MISC: 99 refills | Status: DC

## 2022-05-18 NOTE — Progress Notes (Signed)
Assessment & Plan:  Kendra Griffith was seen today for hypertension.  Diagnoses and all orders for this visit:  Primary hypertension -     CMP14+EGFR Continue all antihypertensives as prescribed.  Reminded to bring in blood pressure log for follow  up appointment.  RECOMMENDATIONS: DASH/Mediterranean Diets are healthier choices for HTN.    Need for Tdap vaccination -     Tdap vaccine greater than or equal to 57yo IM  Drug-induced mood disorder (HCC) Stable.  Neuropathy, alcoholic (HCC) Taking gabapentin for pain  Tobacco abuse -     CT CHEST LUNG CA SCREEN LOW DOSE W/O CM; Future  Hesitancy of micturition -     Urinalysis, Complete -     Ambulatory referral to Physical Therapy  Stress incontinence of urine -     Ambulatory referral to Physical Therapy  Prediabetes -     Hemoglobin A1c  Screening for deficiency anemia -     CBC with Differential    Patient has been counseled on age-appropriate routine health concerns for screening and prevention. These are reviewed and up-to-date. Referrals have been placed accordingly. Immunizations are up-to-date or declined.    Subjective:   Chief Complaint  Patient presents with   Hypertension   HPI Kendra Griffith 57 y.o. female presents to office today for follow up to HTN and prediabetes.   She has a past medical history of ABSCESS, TOOTH (08/20/2009), Anemia, Anxiety, Bipolar disorder ([place]), Chronic pain, Clotting disorder, Dental caries, Depression, DM2, GERD, Hypertension, Pre-diabetes, Sickle cell trait    Patient has been counseled on age-appropriate routine health concerns for screening and prevention. These are reviewed and up-to-date. Referrals have been placed accordingly. Immunizations are up-to-date or declined.     MAMMOGRAM: UTD COLONOSCOPY: UTD PAP SMEAR: UTD   HTN Blood pressure is well controlled. She is taking losartan 25 mg daily and amlodipine 10 mg daily. BP Readings from Last 3 Encounters:  05/18/22  129/86  05/16/22 120/81  03/22/22 (!) 146/101     Urinary Incontinence: Patient complains of urinary incontinence. This has been present for a few years. She leaks urine with coughing, laughing. Patient describes the symptoms as  frequent urination (severalx per day), urge to urinate with little or no warning, urine leakage with coughing/heavy physical activity, and urine leaking unpredictably.  Factors associated with symptoms include associated with menopause. Evaluation to date includes none.Treatment to date includes UA pending.    DM 2 Well controlled with diet only at this time.  Lab Results  Component Value Date   HGBA1C 6.3 (H) 01/05/2022    Review of Systems  Constitutional:  Negative for fever, malaise/fatigue and weight loss.  HENT: Negative.  Negative for nosebleeds.   Eyes: Negative.  Negative for blurred vision, double vision and photophobia.  Respiratory: Negative.  Negative for cough and shortness of breath.   Cardiovascular: Negative.  Negative for chest pain, palpitations and leg swelling.  Gastrointestinal: Negative.  Negative for heartburn, nausea and vomiting.  Genitourinary:  Positive for frequency and urgency. Negative for dysuria, flank pain and hematuria.  Musculoskeletal: Negative.  Negative for myalgias.  Neurological:  Positive for sensory change. Negative for dizziness, focal weakness, seizures and headaches.  Psychiatric/Behavioral: Negative.  Negative for suicidal ideas.     Past Medical History:  Diagnosis Date   ABSCESS, TOOTH 08/20/2009   Qualifier: Diagnosis of  By: Jorene Minors, Scott     Anemia    Anxiety    Bipolar disorder ([place])    Blood  transfusion without reported diagnosis    childbirth   Chronic pain    feet and back   Clotting disorder (HCC)    childbirth   Dental caries    lesion soft palate   Depression    Diabetes mellitus without complication ([place])    prediabetes   GERD (gastroesophageal reflux disease)    Hypertension     Injury    right side from jumping off bunkbed   Pre-diabetes    Sickle cell trait ([place])    Wears glasses     Past Surgical History:  Procedure Laterality Date   BARTHOLIN CYST MARSUPIALIZATION N/A 07/16/2019   Procedure: EXCISION OF BARTHOLIN CYST;  Surgeon: Chancy Milroy, MD;  Location: [facility];  Service: Gynecology;  Laterality: N/A;   CESAREAN SECTION     three   COLONOSCOPY     cyst removal 1997     FACIAL RECONSTRUCTION SURGERY     FOOT SURGERY Bilateral    LESION REMOVAL Left 10/15/2018   Procedure: Lesion Removal left soft palate;  Surgeon: Diona Browner, DDS;  Location: [facility];  Service: Oral Surgery;  Laterality: Left;   TOOTH EXTRACTION Bilateral 10/15/2018   Procedure: DENTAL EXTRACTIONS OF TEETH NUMBER TWO, FOUR, FIVE, SIX, SEVEN, EIGHT, NINE, TEN, ELEVEN, TWELVE, THIRTEEN, FOURTEEN, SEVENTEEN, TWENTY-ONE, TWENTY-TWO, TWENTY-THREE, TWENTY-FOUR, TWENTY-FIVE, TWENTY-SIX, TWENTY-SEVEN, TWENTY-EIGHT WITH ALVEOLOPLASTY;  Surgeon: Diona Browner, DDS;  Location: [facility];  Service: Oral Surgery;  Laterality: Bilateral;   TUBAL LIGATION      Family History  Problem Relation Age of Onset   Heart disease Mother    Asthma Sister    Diabetes Sister    COPD Sister    Diabetes Sister    Diabetes Sister    Breast cancer Neg Hx    Colon cancer Neg Hx    Esophageal cancer Neg Hx    Pancreatic cancer Neg Hx    Liver disease Neg Hx    Stomach cancer Neg Hx     Social History Reviewed with no changes to be made today.   Outpatient Medications Prior to Visit  Medication Sig Dispense Refill   acetaminophen-codeine (TYLENOL #3) 300-30 MG tablet Take 1-2 tablets by mouth every 4 (four) hours as needed for moderate pain. 60 tablet 0   amitriptyline (ELAVIL) 75 MG tablet Take 1 tablet (75 mg total) by mouth at bedtime. 90 tablet 1   amLODipine (NORVASC) 10 MG tablet Take 1 tablet (10 mg total) by mouth daily. for blood pressure 90 tablet 1   Biotin 5 MG CAPS Take 5 mg by  mouth 3 (three) times daily.     gabapentin (NEURONTIN) 300 MG capsule Take 1 capsule (300 mg total) by mouth 2 (two) times daily. 180 capsule 3   hydrOXYzine (ATARAX) 25 MG tablet Take 1 tablet (25 mg total) by mouth 3 (three) times daily as needed. 90 tablet 1   ibuprofen (ADVIL) 800 MG tablet Take 1 tablet (800 mg total) by mouth every 8 (eight) hours as needed. 60 tablet 1   losartan (COZAAR) 25 MG tablet Take 1 tablet (25 mg total) by mouth daily. FOR BLOOD PRESSURE 90 tablet 1   metoprolol tartrate (LOPRESSOR) 100 MG tablet Take tablet TWO hours prior to your cardiac CT scan. 1 tablet 0   Multiple Vitamin (MULTI-VITAMIN) tablet Take 1 tablet by mouth daily.     omeprazole (PRILOSEC) 20 MG capsule Take 1 capsule (20 mg total) by mouth daily. 90 capsule 1   rosuvastatin (  CRESTOR) 5 MG tablet Take 1 tablet (5 mg total) by mouth daily. 90 tablet 1   vitamin B-12 (CYANOCOBALAMIN) 500 MCG tablet Take 500 mcg by mouth daily.     vitamin C (ASCORBIC ACID) 500 MG tablet Take 500 mg by mouth daily.     Zinc 50 MG CAPS Take 1 capsule by mouth daily.     albuterol (VENTOLIN HFA) 108 (90 Base) MCG/ACT inhaler Inhale 2 puffs into the lungs every 6 (six) hours as needed for wheezing or shortness of breath. (Patient not taking: Reported on 05/18/2022) 18 g 1   chlorhexidine (HIBICLENS) 4 % external liquid Apply topically daily as needed. Use when bathing or showering (Patient not taking: Reported on 05/18/2022) 946 mL 0   cholecalciferol (VITAMIN D3) 25 MCG (1000 UT) tablet Take 1,000 Units by mouth daily.     ezetimibe (ZETIA) 10 MG tablet Take 1 tablet (10 mg total) by mouth daily. 90 tablet 3   famotidine (PEPCID) 20 MG tablet Take 1 tablet (20 mg total) by mouth 2 (two) times daily. (Patient not taking: Reported on 05/18/2022) 180 tablet 1   ferrous sulfate 324 MG TBEC Take 324 mg by mouth. (Patient not taking: Reported on 05/18/2022)     GLUTAMINE PO Take 500 mg by mouth.     hydrocortisone (ANUSOL-HC) 2.5 %  rectal cream Place 1 application rectally at bedtime. (Patient not taking: Reported on 05/18/2022) 30 g 1   hydroquinone 4 % cream Apply topically 2 (two) times daily. (Patient not taking: Reported on 05/18/2022)     nicotine polacrilex (NICORETTE) 2 MG gum Take 1 each (2 mg total) by mouth as needed for smoking cessation. (Patient not taking: Reported on 05/18/2022) 100 tablet 0   NONFORMULARY OR COMPOUNDED ITEM Peripheral Neuropathy Cream: Bupivacaine 1%, Doxepin 3%, Gabapentin 6%, Pentoxifylline 3%, Topiramate 1% Order faxed to [place] (Patient not taking: Reported on 05/18/2022) 1 each 3   Omega-3 Fatty Acids (OMEGA 3 500) 500 MG CAPS Take 500 mg by mouth daily. (Patient not taking: Reported on 05/18/2022)     polyethylene glycol powder (GLYCOLAX/MIRALAX) 17 GM/SCOOP powder Take 17 g by mouth 2 (two) times daily as needed. (Patient not taking: Reported on 05/18/2022) 3350 g 1   No facility-administered medications prior to visit.    Allergies  Allergen Reactions   Amoxicillin     Pain Did it involve swelling of the face/tongue/throat, SOB, or low BP? No Did it involve sudden or severe rash/hives, skin peeling, or any reaction on the inside of your mouth or nose? No Did you need to seek medical attention at a hospital or doctor's office? Yes When did it last happen?      5 years ago If all above answers are "NO", may proceed with cephalosporin use.    Darvocet [Propoxyphene N-Acetaminophen] Nausea And Vomiting    Makes her jittery   Hydrocodone-Acetaminophen Nausea And Vomiting    Upset stomach   Percocet [Oxycodone-Acetaminophen]     Makes her jittery   Valium [Diazepam] Other (See Comments)    Makes pt. Feel "out of wack"       Objective:    BP 129/86   Pulse 86   Ht '5\' 5"'$  (1.651 m)   Wt 169 lb (76.7 kg)   LMP 03/12/2019 (Approximate)   SpO2 100%   BMI 28.12 kg/m  Wt Readings from Last 3 Encounters:  05/18/22 169 lb (76.7 kg)  03/22/22 171 lb (77.6 kg)  01/05/22 172  lb (78  kg)    Physical Exam       Patient has been counseled extensively about nutrition and exercise as well as the importance of adherence with medications and regular follow-up. The patient was given clear instructions to go to ER or return to medical center if symptoms don't improve, worsen or new problems develop. The patient verbalized understanding.   Follow-up: No follow-ups on file.   [name], FNP-BC [facility] and [facility] [place], [place]   05/18/2022, 7:08 PM

## 2022-05-19 LAB — CBC WITH DIFFERENTIAL/PLATELET
Basophils Absolute: 0.1 10*3/uL (ref 0.0–0.2)
Basos: 1 %
EOS (ABSOLUTE): 0.2 10*3/uL (ref 0.0–0.4)
Eos: 3 %
Hematocrit: 38.7 % (ref 34.0–46.6)
Hemoglobin: 12.5 g/dL (ref 11.1–15.9)
Immature Grans (Abs): 0 10*3/uL (ref 0.0–0.1)
Immature Granulocytes: 0 %
Lymphocytes Absolute: 3.9 10*3/uL — ABNORMAL HIGH (ref 0.7–3.1)
Lymphs: 55 %
MCH: 26.7 pg (ref 26.6–33.0)
MCHC: 32.3 g/dL (ref 31.5–35.7)
MCV: 83 fL (ref 79–97)
Monocytes Absolute: 0.5 10*3/uL (ref 0.1–0.9)
Monocytes: 7 %
Neutrophils Absolute: 2.4 10*3/uL (ref 1.4–7.0)
Neutrophils: 34 %
Platelets: 199 10*3/uL (ref 150–450)
RBC: 4.68 x10E6/uL (ref 3.77–5.28)
RDW: 13.5 % (ref 11.7–15.4)
WBC: 7.1 10*3/uL (ref 3.4–10.8)

## 2022-05-19 LAB — CMP14+EGFR
ALT: 18 [IU]/L (ref 0–32)
AST: 17 [IU]/L (ref 0–40)
Albumin/Globulin Ratio: 1.8 (ref 1.2–2.2)
Albumin: 4.6 g/dL (ref 3.8–4.9)
Alkaline Phosphatase: 108 [IU]/L (ref 44–121)
BUN/Creatinine Ratio: 14 (ref 9–23)
BUN: 14 mg/dL (ref 6–24)
Bilirubin Total: 0.4 mg/dL (ref 0.0–1.2)
CO2: 22 mmol/L (ref 20–29)
Calcium: 10.3 mg/dL — ABNORMAL HIGH (ref 8.7–10.2)
Chloride: 105 mmol/L (ref 96–106)
Creatinine, Ser: 0.99 mg/dL (ref 0.57–1.00)
Globulin, Total: 2.5 g/dL (ref 1.5–4.5)
Glucose: 103 mg/dL — ABNORMAL HIGH (ref 70–99)
Potassium: 4.2 mmol/L (ref 3.5–5.2)
Sodium: 144 mmol/L (ref 134–144)
Total Protein: 7.1 g/dL (ref 6.0–8.5)
eGFR: 67 mL/min/{1.73_m2}

## 2022-05-19 LAB — MICROSCOPIC EXAMINATION
Bacteria, UA: NONE SEEN
Casts: NONE SEEN /LPF
RBC, Urine: NONE SEEN /HPF (ref 0–2)
WBC, UA: NONE SEEN /HPF (ref 0–5)

## 2022-05-19 LAB — URINALYSIS, COMPLETE
Bilirubin, UA: NEGATIVE
Glucose, UA: NEGATIVE
Ketones, UA: NEGATIVE
Leukocytes,UA: NEGATIVE
Nitrite, UA: NEGATIVE
Protein,UA: NEGATIVE
RBC, UA: NEGATIVE
Specific Gravity, UA: 1.01 (ref 1.005–1.030)
Urobilinogen, Ur: 0.2 mg/dL (ref 0.2–1.0)
pH, UA: 6.5 (ref 5.0–7.5)

## 2022-05-19 LAB — HEMOGLOBIN A1C
Est. average glucose Bld gHb Est-mCnc: 140 mg/dL
Hgb A1c MFr Bld: 6.5 % — ABNORMAL HIGH (ref 4.8–5.6)

## 2022-06-20 DIAGNOSIS — R32 Unspecified urinary incontinence: Secondary | ICD-10-CM | POA: Diagnosis not present

## 2022-06-20 DIAGNOSIS — N393 Stress incontinence (female) (male): Secondary | ICD-10-CM | POA: Diagnosis not present

## 2022-06-20 DIAGNOSIS — I10 Essential (primary) hypertension: Secondary | ICD-10-CM | POA: Diagnosis not present

## 2022-06-24 ENCOUNTER — Encounter: Payer: Self-pay | Admitting: Physical Therapy

## 2022-06-24 ENCOUNTER — Ambulatory Visit: Payer: Medicaid Other | Attending: Nurse Practitioner | Admitting: Physical Therapy

## 2022-06-24 ENCOUNTER — Other Ambulatory Visit: Payer: Self-pay

## 2022-06-24 DIAGNOSIS — R3911 Hesitancy of micturition: Secondary | ICD-10-CM | POA: Diagnosis present

## 2022-06-24 DIAGNOSIS — R279 Unspecified lack of coordination: Secondary | ICD-10-CM | POA: Insufficient documentation

## 2022-06-24 DIAGNOSIS — M62838 Other muscle spasm: Secondary | ICD-10-CM | POA: Diagnosis not present

## 2022-06-24 DIAGNOSIS — N393 Stress incontinence (female) (male): Secondary | ICD-10-CM | POA: Diagnosis not present

## 2022-06-24 DIAGNOSIS — M6281 Muscle weakness (generalized): Secondary | ICD-10-CM | POA: Diagnosis not present

## 2022-06-24 DIAGNOSIS — R293 Abnormal posture: Secondary | ICD-10-CM | POA: Diagnosis not present

## 2022-06-24 NOTE — Patient Instructions (Signed)
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
## 2022-06-30 ENCOUNTER — Ambulatory Visit
Admission: RE | Admit: 2022-06-30 | Discharge: 2022-06-30 | Disposition: A | Discharge status: Home / Self Care | Payer: Medicaid Other | Source: Ambulatory Visit | Attending: Nurse Practitioner | Admitting: Nurse Practitioner

## 2022-06-30 DIAGNOSIS — Z720 Tobacco use: Secondary | ICD-10-CM

## 2022-07-07 ENCOUNTER — Telehealth (HOSPITAL_BASED_OUTPATIENT_CLINIC_OR_DEPARTMENT_OTHER): Payer: Medicaid Other | Admitting: Physician Assistant

## 2022-07-07 ENCOUNTER — Ambulatory Visit: Payer: Self-pay

## 2022-07-07 ENCOUNTER — Other Ambulatory Visit: Payer: Self-pay | Admitting: Nurse Practitioner

## 2022-07-07 DIAGNOSIS — H00014 Hordeolum externum left upper eyelid: Secondary | ICD-10-CM | POA: Diagnosis not present

## 2022-07-07 DIAGNOSIS — G894 Chronic pain syndrome: Secondary | ICD-10-CM

## 2022-07-07 DIAGNOSIS — F17200 Nicotine dependence, unspecified, uncomplicated: Secondary | ICD-10-CM

## 2022-07-07 DIAGNOSIS — F32A Depression, unspecified: Secondary | ICD-10-CM

## 2022-07-07 MED ORDER — GENTAMICIN SULFATE 0.3 % OP SOLN: 1.0000 [drp] | Freq: Four times a day (QID) | OPHTHALMIC | 0 refills | Status: DC

## 2022-07-07 NOTE — Patient Instructions (Signed)
Stye ?A stye, also known as a hordeolum, is a bump that forms on an eyelid. It may look like a pimple next to the eyelash. A stye can form inside the eyelid (internal stye) or outside the eyelid (external stye). A stye can cause redness, swelling, and pain on the eyelid. ?Styes are very common. Anyone can get them at any age. They usually occur in just one eye at a time, but you may have more than one in either eye. ?What are the causes? ?A stye is caused by an infection. The infection is almost always caused by bacteria called Staphylococcus aureus. This is a common type of bacteria that lives on the skin. ?An internal stye may result from an infected oil-producing gland inside the eyelid. An external stye may be caused by an infection at the base of the eyelash (hair follicle). ?What increases the risk? ?You are more likely to develop a stye if: ?You have had a stye before. ?You have any of these conditions: ?Red, itchy, inflamed eyelids (blepharitis). ?A skin condition such as seborrheic dermatitis or rosacea. ?High fat levels in your blood (lipids). ?Dry eyes. ?What are the signs or symptoms? ?The most common symptom of a stye is eyelid pain. Internal styes are more painful than external styes. Other symptoms may include: ?Painful swelling of your eyelid. ?A scratchy feeling in your eye. ?Tearing and redness of your eye. ?A pimple-like bump on the edge of the eyelid. ?Pus draining from the stye. ?How is this diagnosed? ?Your health care provider may be able to diagnose a stye just by examining your eye. The health care provider may also check to make sure: ?You do not have a fever or other signs of a more serious infection. ?The infection has not spread to other parts of your eye or areas around your eye. ?How is this treated? ?Most styes will clear up in a few days without treatment or with warm compresses applied to the area. You may need to use antibiotic drops or ointment to treat an infection. Sometimes,  steroid drops or ointment are used in addition to antibiotics. ?In some cases, your health care provider may give you a small steroid injection in the eyelid. ?If your stye does not heal with routine treatment, your health care provider may drain pus from the stye using a thin blade or needle. This may be done if the stye is large, causing a lot of pain, or affecting your vision. ?Follow these instructions at home: ?Take over-the-counter and prescription medicines only as told by your health care provider. This includes eye drops or ointments. ?If you were prescribed an antibiotic medicine, steroid medicine, or both, apply or use them as told by your health care provider. Do not stop using the medicine even if your condition improves. ?Apply a warm, wet cloth (warm compress) to your eye for 5-10 minutes, 4 to 6 times a day. ?Clean the affected eyelid as directed by your health care provider. ?Do not wear contact lenses or eye makeup until your stye has healed and your health care provider says that it is safe. ?Do not try to pop or drain the stye. ?Do not rub your eye. ?Contact a health care provider if: ?You have chills or a fever. ?Your stye does not go away after several days. ?Your stye affects your vision. ?Your eyeball becomes swollen, red, or painful. ?Get help right away if: ?You have pain when moving your eye around. ?Summary ?A stye is a bump that forms   on an eyelid. It may look like a pimple next to the eyelash. ?A stye can form inside the eyelid (internal stye) or outside the eyelid (external stye). A stye can cause redness, swelling, and pain on the eyelid. ?Your health care provider may be able to diagnose a stye just by examining your eye. ?Apply a warm, wet cloth (warm compress) to your eye for 5-10 minutes, 4 to 6 times a day. ?This information is not intended to replace advice given to you by your health care provider. Make sure you discuss any questions you have with your health care  provider. ?Document Revised: 06/03/2020 Document Reviewed: 06/03/2020 ?Elsevier Patient Education ? 2023 Elsevier Inc. ? ?

## 2022-07-07 NOTE — Telephone Encounter (Signed)
.   Summary: left eye irritation   The patient has experienced irritation and swelling in their left eye lid  The patient shares that they are able to see out of their eye but experiencing discomfort  The patient shares that they may have irritated their eye roughly 3 days ago  Please contact the patient further when possible         Chief Complaint: left eyelid swelling and pain Symptoms: left eyelid swelling upper lid. Drainage noted when waking up in am. Reports runny nose. No fever can see out of eye Frequency: 3 days  Pertinent Negatives: Patient denies fever no drainage now. Not swollen shut  Disposition: [] ED /[] Urgent Care (no appt availability in office) / [x] Appointment(In office/virtual)/ []  [facility]/ [] Home Care/ [] Refused Recommended Disposition /[] [facility]/ []  Follow-up with PCP Additional Notes:   My chart VV scheduled today due to no transportation . # [phone] is number to call for further questions.       Reason for Disposition  MODERATE-SEVERE eyelid swelling on one side  (Exception: Due to a mosquito bite.)  Answer Assessment - Initial Assessment Questions 1. ONSET: "When did the swelling start?" (e.g., minutes, hours, days)     3 days ago  2. LOCATION: "What part of the eyelids is swollen?"     Left upper eyelid  3. SEVERITY: "How swollen is it?"     More swelling than yesterday  can see out of eye  4. ITCHING: "Is there any itching?" If Yes, ask: "How much?"   (Scale 1-10; mild, moderate or severe)     No  5. PAIN: "Is the swelling painful to touch?" If Yes, ask: "How painful is it?"   (Scale 1-10; mild, moderate or severe)     Tender  6. FEVER: "Do you have a fever?" If Yes, ask: "What is it, how was it measured, and when did it start?"      no 7. CAUSE: "What do you think is causing the swelling?"     Not sure  8. RECURRENT SYMPTOM: "Have you had eyelid swelling before?" If Yes, ask: "When was the last time?" "What  happened that time?"     Na  9. OTHER SYMPTOMS: "Do you have any other symptoms?" (e.g., blurred vision, eye discharge, rash, runny nose)     Discharge crusty from left eye upon awakening. Left eye lid swelling,  10. PREGNANCY: "Is there any chance you are pregnant?" "When was your last menstrual period?"       na  Protocols used: Eye - Swelling-A-AH

## 2022-07-07 NOTE — Telephone Encounter (Signed)
Patient called, unable to LVM d/t VM not set up.   Summary: left eye irritation   The patient has experienced irritation and swelling in their left eye lid  The patient shares that they are able to see out of their eye but experiencing discomfort  The patient shares that they may have irritated their eye roughly 3 days ago  Please contact the patient further when possible

## 2022-07-07 NOTE — Telephone Encounter (Signed)
Medication Refill - Medication:  hydrOXYzine (ATARAX) 25 MG tablet  acetaminophen-codeine (TYLENOL #3) 300-30 MG tablet  albuterol (VENTOLIN HFA) 108 (90 Base) MCG/ACT inhaler   Has the patient contacted their pharmacy? Yes.   (Agent: If no, request that the patient contact the pharmacy for the refill. If patient does not wish to contact the pharmacy document the reason why and proceed with request.) (Agent: If yes, when and what did the pharmacy advise?)  Preferred Pharmacy (with phone number or street name):  [id number] - [place], [place] Phone: [phone]  Fax: [phone]     Has the patient been seen for an appointment in the last year OR does the patient have an upcoming appointment? Yes.    Agent: Please be advised that RX refills may take up to 3 business days. We ask that you follow-up with your pharmacy.

## 2022-07-07 NOTE — Telephone Encounter (Signed)
Md aware to add visit

## 2022-07-07 NOTE — Progress Notes (Signed)
Virtual Visit via Video Note  I connected with Kendra Griffith on 07/07/22 at  4:10 PM EDT by a video enabled telemedicine application and verified that I am speaking with the correct person using two identifiers.  Location: Patient: home Provider: [facility] office   I discussed the limitations of evaluation and management by telemedicine and the availability of in person appointments. The patient expressed understanding and agreed to proceed.  History of Present Illness: same day appt scheduled by [facility] with L eye pain and upper lid swelling with some crusty drainage.  This has been going on about 2-3 days.  Does not affect vision.    Observations/Objective: NAD.  [place]/AT.  L upper lid is slightly swollen and toward the inner canthus there is a prominence that appears like a stye.    Assessment and Plan: 1. Hordeolum externum of left upper eyelid Compresses.  If worsens or affects vision go to UC or ED - gentamicin (GARAMYCIN) 0.3 % ophthalmic solution; Place 1 drop into the left eye 4 (four) times daily.  Dispense: 5 mL; Refill: 0    Follow Up Instructions: As next scheduled   I discussed the assessment and treatment plan with the patient. The patient was provided an opportunity to ask questions and all were answered. The patient agreed with the plan and demonstrated an understanding of the instructions.   The patient was advised to call back or seek an in-person evaluation if the symptoms worsen or if the condition fails to improve as anticipated.  I provided 12 minutes of non-face-to-face time during this encounter.   Freeman Caldron, PA-C  Patient ID: Kendra Griffith, female   DOB: 1965/12/16, 57 y.o.   MRN: [id number]

## 2022-07-08 MED ORDER — HYDROXYZINE HCL 25 MG PO TABS
25.0000 mg | ORAL_TABLET | Freq: Three times a day (TID) | ORAL | 1 refills | Status: DC | PRN
  Filled 2022-09-26: qty 90, 30d supply, fill #0

## 2022-07-08 MED ORDER — ACETAMINOPHEN-CODEINE 300-30 MG PO TABS: 1.0000 | ORAL_TABLET | ORAL | 0 refills | Status: DC | PRN

## 2022-07-08 MED ORDER — ALBUTEROL SULFATE HFA 108 (90 BASE) MCG/ACT IN AERS: 2.0000 | INHALATION_SPRAY | Freq: Four times a day (QID) | RESPIRATORY_TRACT | 0 refills | Status: DC | PRN

## 2022-07-08 NOTE — Telephone Encounter (Signed)
Requested Prescriptions  Pending Prescriptions Disp Refills   albuterol (VENTOLIN HFA) 108 (90 Base) MCG/ACT inhaler 18 g 1    Sig: Inhale 2 puffs into the lungs every 6 (six) hours as needed for wheezing or shortness of breath.     Pulmonology:  Beta Agonists 2 Passed - 07/07/2022  3:22 PM      Passed - Last BP in normal range    BP Readings from Last 1 Encounters:  05/18/22 129/86         Passed - Last Heart Rate in normal range    Pulse Readings from Last 1 Encounters:  05/18/22 86         Passed - Valid encounter within last 12 months    Recent Outpatient Visits           Yesterday Hordeolum externum of left upper eyelid   [place], [place]   1 month ago Primary hypertension   [place], Vernia Buff, NP   3 months ago Primary hypertension   [place], Jarome Matin, RPH-CPP   6 months ago Primary hypertension   [place], [place] W, NP   9 months ago Type 2 diabetes mellitus with diabetic polyneuropathy, without long-term current use of insulin [facility])   [place] [name], NP       Future Appointments             In 1 month [name], NP [place]   In 4 months [place], Minette Brine, [place], CPR             acetaminophen-codeine (TYLENOL #3) 300-30 MG tablet 60 tablet 0    Sig: Take 1-2 tablets by mouth every 4 (four) hours as needed for moderate pain.     Not Delegated - Analgesics:  Opioid Agonist Combinations 2 Failed - 07/07/2022  3:22 PM      Failed - This refill cannot be delegated      Failed - Urine Drug Screen completed in last 360 days      Passed - Cr in normal range and within 360 days    Creat  Date Value Ref Range Status  01/21/2016 0.83  0.50 - 1.05 mg/dL Final    Comment:      For patients > or = 57 years of age: The upper reference limit for Creatinine is approximately 13% higher for people identified as African-American.      Creatinine, Ser  Date Value Ref Range Status  05/18/2022 0.99 0.57 - 1.00 mg/dL Final   Creatinine,U  Date Value Ref Range Status  07/30/2009 73.1 mg/dL Final    Comment:    See lab report for associated comment(s)   Creatinine, Urine  Date Value Ref Range Status  01/21/2016 166 20 - 320 mg/dL Final         Passed - eGFR is 10 or above and within 360 days    GFR, Est African American  Date Value Ref Range Status  01/21/2016 >89 >=60 mL/min Final   GFR calc Af Amer  Date Value Ref Range Status  02/24/2020 81 >59 mL/min/1.73 Final    Comment:    **In accordance with recommendations from the NKF-ASN Task force,**   [facility] is in the  process of updating its eGFR calculation to the   2021 CKD-EPI creatinine equation that estimates kidney function   without a race variable.    GFR, Est Non African American  Date Value Ref Range Status  01/21/2016 82 >=60 mL/min Final   GFR calc non Af Amer  Date Value Ref Range Status  02/24/2020 70 >59 mL/min/1.73 Final   eGFR  Date Value Ref Range Status  05/18/2022 67 >59 mL/min/1.73 Final         Passed - Patient is not pregnant      Passed - Valid encounter within last 3 months    Recent Outpatient Visits           Yesterday Hordeolum externum of left upper eyelid   [place], [place]   1 month ago Primary hypertension   [place], Vernia Buff, NP   3 months ago Primary hypertension   [place], Jarome Matin, RPH-CPP   6 months ago Primary hypertension   [place], [place] W, NP   9 months ago Type 2 diabetes mellitus with diabetic polyneuropathy,  without long-term current use of insulin [facility])   [place] [name], NP       Future Appointments             In 1 month [name], NP [place]   In 4 months [place], CPR             hydrOXYzine (ATARAX) 25 MG tablet 90 tablet 1    Sig: Take 1 tablet (25 mg total) by mouth 3 (three) times daily as needed.     Ear, Nose, and Throat:  Antihistamines 2 Passed - 07/07/2022  3:22 PM      Passed - Cr in normal range and within 360 days    Creat  Date Value Ref Range Status  01/21/2016 0.83 0.50 - 1.05 mg/dL Final    Comment:      For patients > or = 57 years of age: The upper reference limit for Creatinine is approximately 13% higher for people identified as African-American.      Creatinine, Ser  Date Value Ref Range Status  05/18/2022 0.99 0.57 - 1.00 mg/dL Final   Creatinine,U  Date Value Ref Range Status  07/30/2009 73.1 mg/dL Final    Comment:    See lab report for associated comment(s)   Creatinine, Urine  Date Value Ref Range Status  01/21/2016 166 20 - 320 mg/dL Final         Passed - Valid encounter within last 12 months    Recent Outpatient Visits           Yesterday Hordeolum externum of left upper eyelid   [place], [place]   1 month ago Primary hypertension   [place], NP   3 months ago Primary hypertension   [place], Jarome Matin, RPH-CPP   6 months ago Primary hypertension   [place], NP   9 months ago Type 2 diabetes mellitus with diabetic polyneuropathy, without long-term current  use of insulin [facility])   [place], Vernia Buff, NP       Future  Appointments             In 1 month [name], NP [place]   In 4 months [place], [place], CPR

## 2022-07-08 NOTE — Telephone Encounter (Signed)
Requested medication (s) are due for refill today: Yes  Requested medication (s) are on the active medication list: yes    Last refill: 01/05/22  #60  0 refills  Future visit scheduled yes 08/17/22  Notes to clinic:Not delegated, please review. Thank you.  Requested Prescriptions  Pending Prescriptions Disp Refills   acetaminophen-codeine (TYLENOL #3) 300-30 MG tablet 60 tablet 0    Sig: Take 1-2 tablets by mouth every 4 (four) hours as needed for moderate pain.     Not Delegated - Analgesics:  Opioid Agonist Combinations 2 Failed - 07/07/2022  3:22 PM      Failed - This refill cannot be delegated      Failed - Urine Drug Screen completed in last 360 days      Passed - Cr in normal range and within 360 days    Creat  Date Value Ref Range Status  01/21/2016 0.83 0.50 - 1.05 mg/dL Final    Comment:      For patients > or = 57 years of age: The upper reference limit for Creatinine is approximately 13% higher for people identified as African-American.      Creatinine, Ser  Date Value Ref Range Status  05/18/2022 0.99 0.57 - 1.00 mg/dL Final   Creatinine,U  Date Value Ref Range Status  07/30/2009 73.1 mg/dL Final    Comment:    See lab report for associated comment(s)   Creatinine, Urine  Date Value Ref Range Status  01/21/2016 166 20 - 320 mg/dL Final         Passed - eGFR is 10 or above and within 360 days    GFR, Est African American  Date Value Ref Range Status  01/21/2016 >89 >=60 mL/min Final   GFR calc Af Amer  Date Value Ref Range Status  02/24/2020 81 >59 mL/min/1.73 Final    Comment:    **In accordance with recommendations from the NKF-ASN Task force,**   [facility] is in the process of updating its eGFR calculation to the   2021 CKD-EPI creatinine equation that estimates kidney function   without a race variable.    GFR, Est Non African American  Date Value Ref Range Status  01/21/2016 82 >=60 mL/min Final   GFR calc non Af Amer  Date Value Ref Range  Status  02/24/2020 70 >59 mL/min/1.73 Final   eGFR  Date Value Ref Range Status  05/18/2022 67 >59 mL/min/1.73 Final         Passed - Patient is not pregnant      Passed - Valid encounter within last 3 months    Recent Outpatient Visits           Yesterday Hordeolum externum of left upper eyelid   [place], [place]   1 month ago Primary hypertension   [place], Vernia Buff, NP   3 months ago Primary hypertension   [place], Jarome Matin, RPH-CPP   6 months ago Primary hypertension   [place], [place] W, NP   9 months ago Type 2 diabetes mellitus with diabetic polyneuropathy, without long-term current use of insulin [facility])   [place] [name], NP       Future Appointments             In 1 month [name],  NP [place]   In 4 months [place], Minette Brine, [place], CPR            Signed Prescriptions Disp Refills   albuterol (VENTOLIN HFA) 108 (90 Base) MCG/ACT inhaler 18 g 0    Sig: Inhale 2 puffs into the lungs every 6 (six) hours as needed for wheezing or shortness of breath.     Pulmonology:  Beta Agonists 2 Passed - 07/07/2022  3:22 PM      Passed - Last BP in normal range    BP Readings from Last 1 Encounters:  05/18/22 129/86         Passed - Last Heart Rate in normal range    Pulse Readings from Last 1 Encounters:  05/18/22 86         Passed - Valid encounter within last 12 months    Recent Outpatient Visits           Yesterday Hordeolum externum of left upper eyelid   [place], [place]   1 month ago Primary hypertension   [place], Vernia Buff, NP    3 months ago Primary hypertension   [place], Jarome Matin, RPH-CPP   6 months ago Primary hypertension   [place], [place] W, NP   9 months ago Type 2 diabetes mellitus with diabetic polyneuropathy, without long-term current use of insulin [facility])   [place] [name], NP       Future Appointments             In 1 month [name], NP [place]   In 4 months [place], CPR             hydrOXYzine (ATARAX) 25 MG tablet 90 tablet 1    Sig: Take 1 tablet (25 mg total) by mouth 3 (three) times daily as needed.     Ear, Nose, and Throat:  Antihistamines 2 Passed - 07/07/2022  3:22 PM      Passed - Cr in normal range and within 360 days    Creat  Date Value Ref Range Status  01/21/2016 0.83 0.50 - 1.05 mg/dL Final    Comment:      For patients > or = 57 years of age: The upper reference limit for Creatinine is approximately 13% higher for people identified as African-American.      Creatinine, Ser  Date Value Ref Range Status  05/18/2022 0.99 0.57 - 1.00 mg/dL Final   Creatinine,U  Date Value Ref Range Status  07/30/2009 73.1 mg/dL Final    Comment:    See lab report for associated comment(s)   Creatinine, Urine  Date Value Ref Range Status  01/21/2016 166 20 - 320 mg/dL Final         Passed - Valid encounter within last 12 months    Recent Outpatient Visits           Yesterday Hordeolum externum of left upper eyelid   [place], [place]   1 month ago Primary hypertension   [place], NP   3 months  ago Primary hypertension   [place], Jarome Matin, RPH-CPP   6 months ago  Primary hypertension   [place], [place] W, NP   9 months ago Type 2 diabetes mellitus with diabetic polyneuropathy, without long-term current use of insulin [facility])   [place] [name], NP       Future Appointments             In 1 month [name], NP [place]   In 4 months [place], [place], CPR

## 2022-07-08 NOTE — Telephone Encounter (Signed)
Requested Prescriptions  Pending Prescriptions Disp Refills   albuterol (VENTOLIN HFA) 108 (90 Base) MCG/ACT inhaler 18 g 0    Sig: Inhale 2 puffs into the lungs every 6 (six) hours as needed for wheezing or shortness of breath.     Pulmonology:  Beta Agonists 2 Passed - 07/07/2022  3:22 PM      Passed - Last BP in normal range    BP Readings from Last 1 Encounters:  05/18/22 129/86         Passed - Last Heart Rate in normal range    Pulse Readings from Last 1 Encounters:  05/18/22 86         Passed - Valid encounter within last 12 months    Recent Outpatient Visits           Yesterday Hordeolum externum of left upper eyelid   [place], [place]   1 month ago Primary hypertension   [place], Vernia Buff, NP   3 months ago Primary hypertension   [place], Jarome Matin, RPH-CPP   6 months ago Primary hypertension   [place], [place] W, NP   9 months ago Type 2 diabetes mellitus with diabetic polyneuropathy, without long-term current use of insulin [facility])   [place] [name], NP       Future Appointments             In 1 month [name], NP [place]   In 4 months [place], Minette Brine, [place], CPR             acetaminophen-codeine (TYLENOL #3) 300-30 MG tablet 60 tablet 0    Sig: Take 1-2 tablets by mouth every 4 (four) hours as needed for moderate pain.     Not Delegated - Analgesics:  Opioid Agonist Combinations 2 Failed - 07/07/2022  3:22 PM      Failed - This refill cannot be delegated      Failed - Urine Drug Screen completed in last 360 days      Passed - Cr in normal range and within 360 days    Creat  Date Value Ref Range Status  01/21/2016 0.83  0.50 - 1.05 mg/dL Final    Comment:      For patients > or = 57 years of age: The upper reference limit for Creatinine is approximately 13% higher for people identified as African-American.      Creatinine, Ser  Date Value Ref Range Status  05/18/2022 0.99 0.57 - 1.00 mg/dL Final   Creatinine,U  Date Value Ref Range Status  07/30/2009 73.1 mg/dL Final    Comment:    See lab report for associated comment(s)   Creatinine, Urine  Date Value Ref Range Status  01/21/2016 166 20 - 320 mg/dL Final         Passed - eGFR is 10 or above and within 360 days    GFR, Est African American  Date Value Ref Range Status  01/21/2016 >89 >=60 mL/min Final   GFR calc Af Amer  Date Value Ref Range Status  02/24/2020 81 >59 mL/min/1.73 Final    Comment:    **In accordance with recommendations from the NKF-ASN Task force,**   [facility] is in the  process of updating its eGFR calculation to the   2021 CKD-EPI creatinine equation that estimates kidney function   without a race variable.    GFR, Est Non African American  Date Value Ref Range Status  01/21/2016 82 >=60 mL/min Final   GFR calc non Af Amer  Date Value Ref Range Status  02/24/2020 70 >59 mL/min/1.73 Final   eGFR  Date Value Ref Range Status  05/18/2022 67 >59 mL/min/1.73 Final         Passed - Patient is not pregnant      Passed - Valid encounter within last 3 months    Recent Outpatient Visits           Yesterday Hordeolum externum of left upper eyelid   [place], [place]   1 month ago Primary hypertension   [place], Vernia Buff, NP   3 months ago Primary hypertension   [place], Jarome Matin, RPH-CPP   6 months ago Primary hypertension   [place], [place] W, NP   9 months ago Type 2 diabetes mellitus with diabetic polyneuropathy,  without long-term current use of insulin [facility])   [place] [name], NP       Future Appointments             In 1 month [name], NP [place]   In 4 months [place], Minette Brine, [place], CPR            Signed Prescriptions Disp Refills   hydrOXYzine (ATARAX) 25 MG tablet 90 tablet 1    Sig: Take 1 tablet (25 mg total) by mouth 3 (three) times daily as needed.     Ear, Nose, and Throat:  Antihistamines 2 Passed - 07/07/2022  3:22 PM      Passed - Cr in normal range and within 360 days    Creat  Date Value Ref Range Status  01/21/2016 0.83 0.50 - 1.05 mg/dL Final    Comment:      For patients > or = 57 years of age: The upper reference limit for Creatinine is approximately 13% higher for people identified as African-American.      Creatinine, Ser  Date Value Ref Range Status  05/18/2022 0.99 0.57 - 1.00 mg/dL Final   Creatinine,U  Date Value Ref Range Status  07/30/2009 73.1 mg/dL Final    Comment:    See lab report for associated comment(s)   Creatinine, Urine  Date Value Ref Range Status  01/21/2016 166 20 - 320 mg/dL Final         Passed - Valid encounter within last 12 months    Recent Outpatient Visits           Yesterday Hordeolum externum of left upper eyelid   [place], [place]   1 month ago Primary hypertension   [place], NP   3 months ago Primary hypertension   [place], Jarome Matin, RPH-CPP   6 months ago Primary hypertension   [place], NP   9 months ago Type 2 diabetes mellitus with  diabetic polyneuropathy, without long-term current use of insulin [facility])   [place], Vernia Buff, NP       Future Appointments             In 1 month [name], NP [place]   In 4 months [place], Minette Brine, [place], CPR

## 2022-07-13 ENCOUNTER — Ambulatory Visit: Payer: Medicaid Other | Admitting: Physical Therapy
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
## 2022-08-03 ENCOUNTER — Encounter: Payer: Self-pay | Admitting: Podiatry

## 2022-08-03 ENCOUNTER — Ambulatory Visit (INDEPENDENT_AMBULATORY_CARE_PROVIDER_SITE_OTHER): Payer: Medicaid Other | Admitting: Podiatry

## 2022-08-03 DIAGNOSIS — B351 Tinea unguium: Secondary | ICD-10-CM

## 2022-08-03 DIAGNOSIS — M79674 Pain in right toe(s): Secondary | ICD-10-CM | POA: Diagnosis not present

## 2022-08-03 DIAGNOSIS — M79675 Pain in left toe(s): Secondary | ICD-10-CM

## 2022-08-03 DIAGNOSIS — E1142 Type 2 diabetes mellitus with diabetic polyneuropathy: Secondary | ICD-10-CM

## 2022-08-03 NOTE — Progress Notes (Signed)
This patient returns to my office for at risk foot care.  This patient requires this care by a professional since this patient will be at risk due to having diabetes.  This patient is unable to cut nails herself since the patient cannot reach her nails.These nails are painful walking and wearing shoes.  This patient presents for at risk foot care today.  General Appearance  Alert, conversant and in no acute stress.  Vascular  Dorsalis pedis and posterior tibial  pulses are palpable  bilaterally.  Capillary return is within normal limits  bilaterally. Temperature is within normal limits  bilaterally.  Neurologic  Senn-Weinstein monofilament wire test within normal limits  bilaterally. Muscle power within normal limits bilaterally.  Nails Thick disfigured discolored nails with subungual debris  from hallux to fifth toes bilaterally. No evidence of bacterial infection or drainage bilaterally.  Orthopedic  No limitations of motion  feet .  No crepitus or effusions noted.  No bony pathology or digital deformities noted.  Skin  normotropic skin with no porokeratosis noted bilaterally.  No signs of infections or ulcers noted.  Porokeratosis sub 2 left.  Onychomycosis  Pain in right toes  Pain in left toes  Consent was obtained for treatment procedures.   Mechanical debridement of nails 1-5  bilaterally performed with a nail nipper.  Filed with dremel without incident.   Callus done as courtesy.   Return office visit   12 weeks                  Told patient to return for periodic foot care and evaluation due to potential at risk complications.   Helane Gunther DPM

## 2022-08-09 ENCOUNTER — Ambulatory Visit: Payer: Medicaid Other | Admitting: Physical Therapy

## 2022-08-17 ENCOUNTER — Other Ambulatory Visit: Payer: Self-pay

## 2022-08-17 ENCOUNTER — Encounter: Payer: Self-pay | Admitting: Nurse Practitioner

## 2022-08-17 ENCOUNTER — Ambulatory Visit: Payer: Medicaid Other | Attending: Nurse Practitioner | Admitting: Nurse Practitioner

## 2022-08-17 VITALS — BP 111/81 | HR 96 | Ht 65.0 in | Wt 167.0 lb

## 2022-08-17 DIAGNOSIS — F17210 Nicotine dependence, cigarettes, uncomplicated: Secondary | ICD-10-CM | POA: Diagnosis not present

## 2022-08-17 DIAGNOSIS — F32A Depression, unspecified: Secondary | ICD-10-CM | POA: Diagnosis not present

## 2022-08-17 DIAGNOSIS — K921 Melena: Secondary | ICD-10-CM

## 2022-08-17 DIAGNOSIS — F419 Anxiety disorder, unspecified: Secondary | ICD-10-CM | POA: Diagnosis not present

## 2022-08-17 DIAGNOSIS — G894 Chronic pain syndrome: Secondary | ICD-10-CM

## 2022-08-17 DIAGNOSIS — E785 Hyperlipidemia, unspecified: Secondary | ICD-10-CM

## 2022-08-17 DIAGNOSIS — I10 Essential (primary) hypertension: Secondary | ICD-10-CM | POA: Diagnosis not present

## 2022-08-17 DIAGNOSIS — F17200 Nicotine dependence, unspecified, uncomplicated: Secondary | ICD-10-CM

## 2022-08-17 DIAGNOSIS — K219 Gastro-esophageal reflux disease without esophagitis: Secondary | ICD-10-CM

## 2022-08-17 DIAGNOSIS — E1165 Type 2 diabetes mellitus with hyperglycemia: Secondary | ICD-10-CM

## 2022-08-17 MED ORDER — LOSARTAN POTASSIUM 25 MG PO TABS
25.0000 mg | ORAL_TABLET | Freq: Every day | ORAL | 1 refills | Status: DC
  Filled 2022-08-17 – 2022-08-23 (×2): qty 90, 90d supply, fill #0

## 2022-08-17 MED ORDER — OMEPRAZOLE 20 MG PO CPDR
20.0000 mg | DELAYED_RELEASE_CAPSULE | Freq: Every day | ORAL | 1 refills | Status: DC
  Filled 2022-08-17 – 2022-08-23 (×2): qty 90, 90d supply, fill #0
  Filled 2022-12-05 (×2): qty 90, 90d supply, fill #1

## 2022-08-17 MED ORDER — ROSUVASTATIN CALCIUM 5 MG PO TABS
5.0000 mg | ORAL_TABLET | Freq: Every day | ORAL | 1 refills | Status: DC
  Filled 2022-08-17 – 2022-08-26 (×3): qty 90, 90d supply, fill #0
  Filled 2022-09-13: qty 90, 90d supply, fill #1

## 2022-08-17 MED ORDER — FAMOTIDINE 20 MG PO TABS
20.0000 mg | ORAL_TABLET | Freq: Two times a day (BID) | ORAL | 1 refills | Status: AC
  Filled 2022-08-17: qty 180, 90d supply, fill #0
  Filled 2022-08-23: qty 60, 30d supply, fill #0
  Filled 2022-10-25: qty 60, 30d supply, fill #1
  Filled 2022-12-05 (×2): qty 60, 30d supply, fill #2

## 2022-08-17 MED ORDER — NICOTINE POLACRILEX 4 MG MT LOZG
4.0000 mg | LOZENGE | OROMUCOSAL | 1 refills | Status: DC | PRN
  Filled 2022-08-17: qty 72, 15d supply, fill #0
  Filled 2022-08-23: qty 72, 30d supply, fill #0

## 2022-08-17 MED ORDER — NICOTINE 21 MG/24HR TD PT24
21.0000 mg | MEDICATED_PATCH | Freq: Every day | TRANSDERMAL | 0 refills | Status: DC
  Filled 2022-08-17 – 2022-08-23 (×2): qty 28, 28d supply, fill #0

## 2022-08-17 MED ORDER — AMITRIPTYLINE HCL 75 MG PO TABS
75.0000 mg | ORAL_TABLET | Freq: Every day | ORAL | 1 refills | Status: DC
  Filled 2022-08-17: qty 23, 23d supply, fill #0
  Filled 2022-08-17: qty 7, 7d supply, fill #0
  Filled 2022-08-23: qty 30, 30d supply, fill #0
  Filled 2022-10-25: qty 30, 30d supply, fill #1
  Filled 2022-12-05 (×2): qty 30, 30d supply, fill #2

## 2022-08-17 NOTE — Progress Notes (Signed)
Patient states that 2 weeks ago she became really depressed. Patient states that she barely ate food and or drank any liquids Stools are black in color. Patient noticed this a week ago.
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
## 2022-08-18 ENCOUNTER — Ambulatory Visit: Payer: Medicaid Other | Attending: Nurse Practitioner | Admitting: Physical Therapy

## 2022-08-18 DIAGNOSIS — R279 Unspecified lack of coordination: Secondary | ICD-10-CM

## 2022-08-18 DIAGNOSIS — R293 Abnormal posture: Secondary | ICD-10-CM | POA: Diagnosis present

## 2022-08-18 DIAGNOSIS — M6281 Muscle weakness (generalized): Secondary | ICD-10-CM | POA: Diagnosis present

## 2022-08-18 DIAGNOSIS — M62838 Other muscle spasm: Secondary | ICD-10-CM
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
## 2022-08-23 ENCOUNTER — Ambulatory Visit: Payer: Self-pay

## 2022-08-23 ENCOUNTER — Other Ambulatory Visit: Payer: Self-pay

## 2022-08-23 ENCOUNTER — Other Ambulatory Visit (HOSPITAL_COMMUNITY): Payer: Self-pay

## 2022-08-23 NOTE — Telephone Encounter (Signed)
  Chief Complaint: AMS Symptoms: confusion and slurred speech lasted approx 5-10 mins Frequency: 08/18/22 Pertinent Negatives: Patient denies any sx currently  Disposition: [] ED /[] Urgent Care (no appt availability in office) / [x] Appointment(In office/virtual)/ []  [facility]/ [] Home Care/ [] Refused Recommended Disposition /[] [facility]/ []  Follow-up with PCP Additional Notes: pt states transportation picked her up on 08/18/22 and pt was asked name and DOB and pt could hear herself talking but wasn't able to make out full words and driver asked if they needed to call 911 but pt refused. Went to rehab appt and was able to complete without issues but pt was concerned. Scheduled pt for first available appt on 09/08/22 with Marylene Land, PA and advised pt I would send message back to PCP as FYI in case they wanted to see her again and educated if sx happen again to call 911 or go to ED asap. Pt verbalized understanding.   Summary: Confusion and slurred speech.   Pt is requesting a nurse call them back. Pt says around the 9th of the month she was experiencing confusion and slurred speech. Pt says she had no other symptoms aside from that. Pt is not currently having any symptoms. Pt would like advice.         Reason for Disposition  [1] Loss of speech or garbled speech AND [2] is a chronic symptom (recurrent or ongoing AND present > 4 weeks)  Answer Assessment - Initial Assessment Questions 1. SYMPTOM: "What is the main symptom you are concerned about?" (e.g., weakness, numbness)     Confusion and slurred speech  2. ONSET: "When did this start?" (minutes, hours, days; while sleeping)     9th of May  4. PATTERN "Does this come and go, or has it been constant since it started?"  "Is it present now?"     Comes and goes  5. CARDIAC SYMPTOMS: "Have you had any of the following symptoms: chest pain, difficulty breathing, palpitations?"     no 6. NEUROLOGIC SYMPTOMS: "Have you had any of  the following symptoms: headache, dizziness, vision loss, double vision, changes in speech, unsteady on your feet?"     Slurred speech  7. OTHER SYMPTOMS: "Do you have any other symptoms?"  Protocols used: Neurologic Deficit-A-AH

## 2022-08-24 ENCOUNTER — Other Ambulatory Visit: Payer: Self-pay

## 2022-08-25 ENCOUNTER — Emergency Department (HOSPITAL_COMMUNITY): Payer: Medicaid Other

## 2022-08-25 ENCOUNTER — Encounter (HOSPITAL_COMMUNITY): Payer: Self-pay

## 2022-08-25 ENCOUNTER — Inpatient Hospital Stay (HOSPITAL_COMMUNITY)
Admission: EM | Admit: 2022-08-25 | Discharge: 2022-08-31 | DRG: 286 | Disposition: A | Discharge status: Home / Self Care | Payer: Medicaid Other | Attending: Internal Medicine | Admitting: Internal Medicine

## 2022-08-25 ENCOUNTER — Other Ambulatory Visit: Payer: Self-pay

## 2022-08-25 ENCOUNTER — Ambulatory Visit: Payer: Medicaid Other | Admitting: Physical Therapy

## 2022-08-25 DIAGNOSIS — I110 Hypertensive heart disease with heart failure: Principal | ICD-10-CM | POA: Diagnosis present

## 2022-08-25 DIAGNOSIS — F32A Depression, unspecified: Secondary | ICD-10-CM | POA: Diagnosis present

## 2022-08-25 DIAGNOSIS — Z885 Allergy status to narcotic agent status: Secondary | ICD-10-CM

## 2022-08-25 DIAGNOSIS — G894 Chronic pain syndrome: Secondary | ICD-10-CM

## 2022-08-25 DIAGNOSIS — K5900 Constipation, unspecified: Secondary | ICD-10-CM | POA: Diagnosis present

## 2022-08-25 DIAGNOSIS — F419 Anxiety disorder, unspecified: Secondary | ICD-10-CM | POA: Diagnosis present

## 2022-08-25 DIAGNOSIS — E119 Type 2 diabetes mellitus without complications: Secondary | ICD-10-CM | POA: Diagnosis present

## 2022-08-25 DIAGNOSIS — K219 Gastro-esophageal reflux disease without esophagitis: Secondary | ICD-10-CM | POA: Diagnosis present

## 2022-08-25 DIAGNOSIS — I82409 Acute embolism and thrombosis of unspecified deep veins of unspecified lower extremity: Secondary | ICD-10-CM | POA: Diagnosis present

## 2022-08-25 DIAGNOSIS — R7401 Elevation of levels of liver transaminase levels: Secondary | ICD-10-CM

## 2022-08-25 DIAGNOSIS — F418 Other specified anxiety disorders: Secondary | ICD-10-CM | POA: Diagnosis not present

## 2022-08-25 DIAGNOSIS — I509 Heart failure, unspecified: Secondary | ICD-10-CM

## 2022-08-25 DIAGNOSIS — D573 Sickle-cell trait: Secondary | ICD-10-CM | POA: Diagnosis present

## 2022-08-25 DIAGNOSIS — Z833 Family history of diabetes mellitus: Secondary | ICD-10-CM

## 2022-08-25 DIAGNOSIS — Z5982 Transportation insecurity: Secondary | ICD-10-CM

## 2022-08-25 DIAGNOSIS — R4781 Slurred speech: Secondary | ICD-10-CM | POA: Diagnosis not present

## 2022-08-25 DIAGNOSIS — I82432 Acute embolism and thrombosis of left popliteal vein: Secondary | ICD-10-CM | POA: Diagnosis present

## 2022-08-25 DIAGNOSIS — D509 Iron deficiency anemia, unspecified: Secondary | ICD-10-CM | POA: Diagnosis present

## 2022-08-25 DIAGNOSIS — R110 Nausea: Secondary | ICD-10-CM | POA: Diagnosis present

## 2022-08-25 DIAGNOSIS — E785 Hyperlipidemia, unspecified: Secondary | ICD-10-CM | POA: Diagnosis present

## 2022-08-25 DIAGNOSIS — Z7901 Long term (current) use of anticoagulants: Secondary | ICD-10-CM

## 2022-08-25 DIAGNOSIS — J9811 Atelectasis: Secondary | ICD-10-CM | POA: Diagnosis present

## 2022-08-25 DIAGNOSIS — Z555 Less than a high school diploma: Secondary | ICD-10-CM

## 2022-08-25 DIAGNOSIS — R630 Anorexia: Secondary | ICD-10-CM | POA: Diagnosis present

## 2022-08-25 DIAGNOSIS — Z8249 Family history of ischemic heart disease and other diseases of the circulatory system: Secondary | ICD-10-CM

## 2022-08-25 DIAGNOSIS — I10 Essential (primary) hypertension: Secondary | ICD-10-CM

## 2022-08-25 DIAGNOSIS — K761 Chronic passive congestion of liver: Secondary | ICD-10-CM | POA: Diagnosis present

## 2022-08-25 DIAGNOSIS — K828 Other specified diseases of gallbladder: Secondary | ICD-10-CM | POA: Diagnosis present

## 2022-08-25 DIAGNOSIS — Z6824 Body mass index (BMI) 24.0-24.9, adult: Secondary | ICD-10-CM

## 2022-08-25 DIAGNOSIS — Z79899 Other long term (current) drug therapy: Secondary | ICD-10-CM

## 2022-08-25 DIAGNOSIS — I428 Other cardiomyopathies: Secondary | ICD-10-CM | POA: Diagnosis present

## 2022-08-25 DIAGNOSIS — I5021 Acute systolic (congestive) heart failure: Secondary | ICD-10-CM | POA: Diagnosis present

## 2022-08-25 DIAGNOSIS — E1142 Type 2 diabetes mellitus with diabetic polyneuropathy: Secondary | ICD-10-CM

## 2022-08-25 DIAGNOSIS — Z880 Allergy status to penicillin: Secondary | ICD-10-CM

## 2022-08-25 DIAGNOSIS — R0602 Shortness of breath: Principal | ICD-10-CM

## 2022-08-25 DIAGNOSIS — D6859 Other primary thrombophilia: Secondary | ICD-10-CM | POA: Diagnosis present

## 2022-08-25 DIAGNOSIS — R059 Cough, unspecified: Secondary | ICD-10-CM | POA: Diagnosis not present

## 2022-08-25 DIAGNOSIS — F17210 Nicotine dependence, cigarettes, uncomplicated: Secondary | ICD-10-CM | POA: Diagnosis present

## 2022-08-25 DIAGNOSIS — R442 Other hallucinations: Secondary | ICD-10-CM | POA: Diagnosis not present

## 2022-08-25 DIAGNOSIS — M7989 Other specified soft tissue disorders: Secondary | ICD-10-CM | POA: Diagnosis present

## 2022-08-25 LAB — URINALYSIS, ROUTINE W REFLEX MICROSCOPIC
Bilirubin Urine: NEGATIVE
Glucose, UA: NEGATIVE mg/dL
Hgb urine dipstick: NEGATIVE
Ketones, ur: 5 mg/dL — AB
Leukocytes,Ua: NEGATIVE
Nitrite: NEGATIVE
Protein, ur: >=300 mg/dL — AB
Specific Gravity, Urine: 1.025 (ref 1.005–1.030)
pH: 5 (ref 5.0–8.0)

## 2022-08-25 LAB — CBC WITH DIFFERENTIAL/PLATELET
Abs Immature Granulocytes: 0.02 10*3/uL (ref 0.00–0.07)
Basophils Absolute: 0 10*3/uL (ref 0.0–0.1)
Basophils Relative: 1 %
Eosinophils Absolute: 0 10*3/uL (ref 0.0–0.5)
Eosinophils Relative: 0 %
HCT: 37.8 % (ref 36.0–46.0)
Hemoglobin: 11.3 g/dL — ABNORMAL LOW (ref 12.0–15.0)
Immature Granulocytes: 1 %
Lymphocytes Relative: 19 %
Lymphs Abs: 0.7 10*3/uL (ref 0.7–4.0)
MCH: 26.7 pg (ref 26.0–34.0)
MCHC: 29.9 g/dL — ABNORMAL LOW (ref 30.0–36.0)
MCV: 89.4 fL (ref 80.0–100.0)
Monocytes Absolute: 0.2 10*3/uL (ref 0.1–1.0)
Monocytes Relative: 6 %
Neutro Abs: 2.8 10*3/uL (ref 1.7–7.7)
Neutrophils Relative %: 73 %
Platelets: 198 10*3/uL (ref 150–400)
RBC: 4.23 MIL/uL (ref 3.87–5.11)
RDW: 16.3 % — ABNORMAL HIGH (ref 11.5–15.5)
WBC: 3.8 10*3/uL — ABNORMAL LOW (ref 4.0–10.5)
nRBC: 1.1 % — ABNORMAL HIGH (ref 0.0–0.2)

## 2022-08-25 LAB — LIPASE, BLOOD: Lipase: 34 U/L (ref 11–51)

## 2022-08-25 LAB — COMPREHENSIVE METABOLIC PANEL
ALT: 174 U/L — ABNORMAL HIGH (ref 0–44)
AST: 98 U/L — ABNORMAL HIGH (ref 15–41)
Albumin: 3.2 g/dL — ABNORMAL LOW (ref 3.5–5.0)
Alkaline Phosphatase: 95 U/L (ref 38–126)
Anion gap: 10 (ref 5–15)
BUN: 14 mg/dL (ref 6–20)
CO2: 23 mmol/L (ref 22–32)
Calcium: 8.9 mg/dL (ref 8.9–10.3)
Chloride: 106 mmol/L (ref 98–111)
Creatinine, Ser: 0.95 mg/dL (ref 0.44–1.00)
GFR, Estimated: >60 mL/min (ref >60)
Glucose, Bld: 179 mg/dL — ABNORMAL HIGH (ref 70–99)
Potassium: 4.1 mmol/L (ref 3.5–5.1)
Sodium: 139 mmol/L (ref 135–145)
Total Bilirubin: 1 mg/dL (ref 0.3–1.2)
Total Protein: 6.6 g/dL (ref 6.5–8.1)

## 2022-08-25 LAB — BRAIN NATRIURETIC PEPTIDE: B Natriuretic Peptide: 2502.5 pg/mL — ABNORMAL HIGH (ref 0.0–100.0)

## 2022-08-25 NOTE — ED Triage Notes (Signed)
Pt arrived via [facility] from home for c/o nauseax30d and worsening depression. Pt told EMS she hasn't been able to sleep since yesterday afternoon at 1500. Pt told EMS she has had decreased appetitex30d. Pt denies SI

## 2022-08-25 NOTE — Discharge Instructions (Addendum)
Your chest x-ray and ultrasound were concerning for heart failure.  We placed a referral for cardiology, and you should call your doctor tomorrow to schedule follow up next week.  If you develop worsening shortness of breath, chest pain, or any other new concerning symptoms you should return to the ED.

## 2022-08-25 NOTE — ED Notes (Signed)
Shift report received, assumed care of patient at this time.  

## 2022-08-25 NOTE — ED Provider Notes (Signed)
[place] EMERGENCY DEPARTMENT AT [facility] Provider Note   CSN: [id number] Arrival date & time: 08/25/22  1645     History  Chief Complaint  Patient presents with   Depression   nausea    Kendra Griffith is a 57 y.o. female.  57 year old female history of anxiety, bipolar disorder, diabetes, GERD, hypertension presenting for multiple concerns.  Patient states over the last few weeks she is poor appetite, shortness of breath, cough, poor mood.  She reports her legs are instantly more swollen than normal as well.  She presented today because her symptoms continue to progress.  She did not have an acute worsening today.  No chest pain, palpitations, dizziness.  She states about a week ago she had a brief episode of slurred speech which is since resolved.  No headache or weakness or vision changes or numbness.  She is otherwise been at her baseline health.  No change in her medications.   Depression Associated symptoms include shortness of breath.       Home Medications Prior to Admission medications   Medication Sig Start Date End Date Taking? Authorizing Provider  acetaminophen-codeine (TYLENOL #3) 300-30 MG tablet Take 1-2 tablets by mouth every 4 (four) hours as needed for moderate pain. 07/08/22   Claiborne Rigg, NP  albuterol (VENTOLIN HFA) 108 (90 Base) MCG/ACT inhaler Inhale 2 puffs into the lungs every 6 (six) hours as needed for wheezing or shortness of breath. 07/08/22   Claiborne Rigg, NP  amitriptyline (ELAVIL) 75 MG tablet Take 1 tablet (75 mg total) by mouth at bedtime for pain and depression 08/17/22   Claiborne Rigg, NP  Biotin 5 MG CAPS Take 5 mg by mouth 3 (three) times daily.    [provider]  chlorhexidine (HIBICLENS) 4 % external liquid Apply topically daily as needed. Use when bathing or showering 10/07/20   Claiborne Rigg, NP  cholecalciferol (VITAMIN D3) 25 MCG (1000 UT) tablet Take 1,000 Units by mouth daily.    [provider]  famotidine (PEPCID) 20 MG tablet Take 1 tablet (20 mg total) by mouth 2 (two) times daily for heartburn 08/17/22   Claiborne Rigg, NP  gabapentin (NEURONTIN) 300 MG capsule Take 1 capsule (300 mg total) by mouth 2 (two) times daily. 03/15/22   Claiborne Rigg, NP  gentamicin (GARAMYCIN) 0.3 % ophthalmic solution Place 1 drop into the left eye 4 (four) times daily. 07/07/22   Anders Simmonds, PA-C  GLUTAMINE PO Take 500 mg by mouth.    [provider]  hydroquinone 4 % cream Apply topically 2 (two) times daily. 05/22/20   [provider]  hydrOXYzine (ATARAX) 25 MG tablet Take 1 tablet (25 mg total) by mouth 3 (three) times daily as needed. 07/08/22   Claiborne Rigg, NP  ibuprofen (ADVIL) 800 MG tablet Take 1 tablet (800 mg total) by mouth every 8 (eight) hours as needed. 01/25/21   Claiborne Rigg, NP  losartan (COZAAR) 25 MG tablet Take 1 tablet (25 mg total) by mouth daily for blood pressure 08/17/22   Claiborne Rigg, NP  Misc. Devices MISC Please provide patient with incontinence pads or disposable underwear or choice. N39.3 05/18/22   Claiborne Rigg, NP  nicotine (NICODERM CQ - DOSED IN MG/24 HOURS) 21 mg/24hr patch Place 1 patch (21 mg total) onto the skin daily. 08/17/22 09/28/22  Claiborne Rigg, NP  nicotine polacrilex (NICOTINE MINI) 4 MG lozenge Dissolve  1 lozenge (4 mg total) by mouth as needed for smoking cessation. 08/17/22   Claiborne Rigg, NP  NONFORMULARY OR COMPOUNDED ITEM Peripheral Neuropathy Cream: Bupivacaine 1%, Doxepin 3%, Gabapentin 6%, Pentoxifylline 3%, Topiramate 1% Order faxed to [facility] 10/02/19   Freddie Breech, DPM  omeprazole (PRILOSEC) 20 MG capsule Take 1 capsule (20 mg total) by mouth daily for heartburn 08/17/22   Claiborne Rigg, NP  rosuvastatin (CRESTOR) 5 MG tablet Take 1 tablet (5 mg total) by mouth daily for cholesterol 08/17/22   Claiborne Rigg, NP  vitamin B-12 (CYANOCOBALAMIN) 500 MCG tablet Take 500 mcg by  mouth daily.    [provider]  vitamin C (ASCORBIC ACID) 500 MG tablet Take 500 mg by mouth daily.    [provider]      Allergies    Amoxicillin, Darvocet [propoxyphene n-acetaminophen], Hydrocodone-acetaminophen, Percocet [oxycodone-acetaminophen], and Valium [diazepam]    Review of Systems   Review of Systems  Respiratory:  Positive for cough and shortness of breath.   Cardiovascular:  Positive for leg swelling.  Psychiatric/Behavioral:  Positive for depression and dysphoric mood.   All other systems reviewed and are negative.   Physical Exam Updated Vital Signs BP (!) 132/97 (BP Location: Right Arm)   Pulse 96   Temp (!) 97.5 F (36.4 C) (Oral)   Resp 16   Ht 5\' 5"  (1.651 m)   Wt 75.8 kg   LMP 03/12/2019 (Approximate)   SpO2 99%   BMI 27.81 kg/m  Physical Exam Vitals and nursing note reviewed.  Constitutional:      General: She is not in acute distress.    Appearance: She is well-developed.  HENT:     Head: Normocephalic and atraumatic.     Nose: Nose normal.     Mouth/Throat:     Mouth: Mucous membranes are moist.     Pharynx: Oropharynx is clear.  Eyes:     Extraocular Movements: Extraocular movements intact.     Conjunctiva/sclera: Conjunctivae normal.     Pupils: Pupils are equal, round, and reactive to light.  Cardiovascular:     Rate and Rhythm: Normal rate and regular rhythm.     Heart sounds: No murmur heard. Pulmonary:     Effort: Pulmonary effort is normal. No respiratory distress.     Breath sounds: Normal breath sounds.  Abdominal:     Palpations: Abdomen is soft.     Tenderness: There is no abdominal tenderness.  Musculoskeletal:        General: No swelling.     Cervical back: Normal range of motion and neck supple.     Right lower leg: Edema present.     Left lower leg: Edema present.  Skin:    General: Skin is warm and dry.     Capillary Refill: Capillary refill takes less than 2 seconds.  Neurological:     General:  No focal deficit present.     Mental Status: She is alert and oriented to person, place, and time. Mental status is at baseline.     Cranial Nerves: No cranial nerve deficit.     Sensory: No sensory deficit.     Motor: No weakness.  Psychiatric:        Mood and Affect: Mood normal.     ED Results / Procedures / Treatments   Labs (all labs ordered are listed, but only abnormal results are displayed) Labs Reviewed  COMPREHENSIVE METABOLIC PANEL - Abnormal; Notable for the following components:  Result Value   Glucose, Bld 179 (*)    Albumin 3.2 (*)    AST 98 (*)    ALT 174 (*)    All other components within normal limits  CBC WITH DIFFERENTIAL/PLATELET - Abnormal; Notable for the following components:   WBC 3.8 (*)    Hemoglobin 11.3 (*)    MCHC 29.9 (*)    RDW 16.3 (*)    nRBC 1.1 (*)    All other components within normal limits  URINALYSIS, ROUTINE W REFLEX MICROSCOPIC - Abnormal; Notable for the following components:   Color, Urine AMBER (*)    APPearance CLOUDY (*)    Ketones, ur 5 (*)    Protein, ur >=300 (*)    Bacteria, UA FEW (*)    All other components within normal limits  BRAIN NATRIURETIC PEPTIDE - Abnormal; Notable for the following components:   B Natriuretic Peptide 2,502.5 (*)    All other components within normal limits  LIPASE, BLOOD  HIV ANTIBODY (ROUTINE TESTING W REFLEX)  COMPREHENSIVE METABOLIC PANEL  CBC    EKG None  Radiology DG Chest 2 View  Result Date: 08/25/2022 CLINICAL DATA:  Cough EXAM: CHEST - 2 VIEW COMPARISON:  12/10/2017 FINDINGS: Interval development of mild cardiomegaly. Mild right basilar atelectasis. No pneumothorax or pleural effusion. Pulmonary vascularity is normal. No acute bone abnormality. IMPRESSION: 1. Mild cardiomegaly. 2. Mild right basilar atelectasis. Electronically Signed   By: Helyn Numbers M.D.   On: 08/25/2022 21:33   CT Head Wo Contrast  Result Date: 08/25/2022 CLINICAL DATA:  Transient slurred speech.  EXAM: CT HEAD WITHOUT CONTRAST TECHNIQUE: Contiguous axial images were obtained from the base of the skull through the vertex without intravenous contrast. RADIATION DOSE REDUCTION: This exam was performed according to the departmental dose-optimization program which includes automated exposure control, adjustment of the mA and/or kV according to patient size and/or use of iterative reconstruction technique. COMPARISON:  MRI examination dated May 05, 2022 FINDINGS: Brain: No evidence of acute infarction, hemorrhage, hydrocephalus, extra-axial collection or mass lesion/mass effect. Vascular: No hyperdense vessel or unexpected calcification. Skull: Normal. Negative for fracture or focal lesion. Sinuses/Orbits: No acute finding. Other: None. IMPRESSION: No acute intracranial pathology. Electronically Signed   By: Larose Hires D.O.   On: 08/25/2022 18:09    Procedures Procedures    Medications Ordered in ED Medications  furosemide (LASIX) injection 40 mg (has no administration in time range)  losartan (COZAAR) tablet 25 mg (has no administration in time range)  rosuvastatin (CRESTOR) tablet 5 mg (has no administration in time range)  amitriptyline (ELAVIL) tablet 75 mg (has no administration in time range)  hydrOXYzine (ATARAX) tablet 25 mg (has no administration in time range)  pantoprazole (PROTONIX) EC tablet 40 mg (has no administration in time range)  gabapentin (NEURONTIN) capsule 300 mg (has no administration in time range)  albuterol (VENTOLIN HFA) 108 (90 Base) MCG/ACT inhaler 2 puff (has no administration in time range)  enoxaparin (LOVENOX) injection 40 mg (has no administration in time range)  furosemide (LASIX) injection 40 mg (has no administration in time range)  insulin aspart (novoLOG) injection 0-6 Units (has no administration in time range)  insulin aspart (novoLOG) injection 0-5 Units (has no administration in time range)  sodium chloride flush (NS) 0.9 % injection 3 mL (has  no administration in time range)  acetaminophen (TYLENOL) tablet 650 mg (has no administration in time range)    Or  acetaminophen (TYLENOL) suppository 650 mg (has no administration in  time range)  polyethylene glycol (MIRALAX / GLYCOLAX) packet 17 g (has no administration in time range)    ED Course/ Medical Decision Making/ A&P Clinical Course as of 08/26/22 0027  Thu Aug 25, 2022  2142 CT Head Wo Contrast [JD]    Clinical Course User Index [JD] Fulton Reek, MD                             Medical Decision Making Amount and/or Complexity of Data Reviewed Labs: ordered. Radiology: ordered. Decision-making details documented in ED Course.  Risk Prescription drug management. Decision regarding hospitalization.   57 year old female with history as above presenting for multiple concerns.  Vital signs reviewed notable for mild hypertension.  On exam she is well-appearing.  Regarding her shortness of breath and cough, she has had no orthopnea, DOE, or chest pain.  Low concern for cardiac ischemia.  Will obtain chest x-ray given cough.  She reports chronically depressed mood, not acutely worsened.  No SI, HI, contracts for safety.  Do not think she needs psychiatric consultation.  She is a poor appetite, but abdominal exam is benign.  She is tolerating p.o., no significant weight loss.  She did report an episode of slurred speech about a week ago.  She is currently asymptomatic with completely normal neurologic exam.  She already had a CT head which is unremarkable, low concern for CVA.  Lab work reviewed, shows mild transaminitis which is nonspecific.  Lipase is normal.  CBC with mild anemia.  Urinalysis appears to be contaminated.  I reviewed her chest x-ray, she has new cardiomegaly.  I performed a bedside ultrasound which is concerning for new onset heart failure with reduced ejection fraction and dilated IVC.  This is consistent with her shortness of breath, lower extremity edema.   She does not have any hypoxia or increased work of breathing, however she does have significantly elevated BNP to 2500.  I suspect her transaminitis may be congestive hepatopathy.  I initially discussed this with the hospitalist who recommended outpatient management.  However given degree of her reduced ejection fraction and elevated BNP I discussed the patient with cardiology who recommended admission for formal echo and consultation in the morning.  I again discussed the patient with the hospitalist who admitted the patient for further management.        Final Clinical Impression(s) / ED Diagnoses Final diagnoses:  SOB (shortness of breath)    Rx / DC Orders ED Discharge Orders          Ordered    Ambulatory referral to Cardiology       Comments: If you have not heard from the Cardiology office within the next 72 hours please call [phone].   08/25/22 2329              Fulton Reek, MD 08/26/22 [id number]    Blane Ohara, MD 08/27/22 2300

## 2022-08-25 NOTE — ED Notes (Signed)
Pt called for 2nd time to treatment area, still no answer from pt at this time from [facility] lobby

## 2022-08-25 NOTE — ED Provider Triage Note (Signed)
Emergency Medicine Provider Triage Evaluation Note  Kendra Griffith , a 57 y.o. female  was evaluated in triage.  Pt complains of transient slurred speech last week and LLE pain after scraping it 2 days ago. Says the slurred speech was Thursday at rehab. Lasted 15 minutes. Declined transport to ED. No neurologic symptoms now.   Also has been depressed recently. No SI or HI or AVH.   Review of Systems  Positive: Leg pain Negative: Weakness, numbness, facial droop  Physical Exam  BP (!) 133/98   Pulse 95   Temp 97.8 F (36.6 C)   Resp 18   Ht 5\' 5"  (1.651 m)   Wt 75.8 kg   LMP 03/12/2019 (Approximate)   SpO2 100%   BMI 27.81 kg/m  Gen:   Awake, no distress   Resp:  Normal effort  MSK:   Moves extremities without difficulty   Medical Decision Making  Medically screening exam initiated at 5:23 PM.  Appropriate orders placed.  Kendra Griffith was informed that the remainder of the evaluation will be completed by another provider, this initial triage assessment does not replace that evaluation, and the importance of remaining in the ED until their evaluation is complete.   Kendra Baton, MD 08/25/22 1726

## 2022-08-25 NOTE — ED Notes (Signed)
Pt called for treatment bed hall 13, pt did not answer from [facility] lobby.
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
## 2022-08-26 ENCOUNTER — Observation Stay (HOSPITAL_BASED_OUTPATIENT_CLINIC_OR_DEPARTMENT_OTHER): Payer: Medicaid Other

## 2022-08-26 ENCOUNTER — Other Ambulatory Visit: Payer: Self-pay

## 2022-08-26 ENCOUNTER — Encounter (HOSPITAL_COMMUNITY): Payer: Self-pay | Admitting: Family Medicine

## 2022-08-26 ENCOUNTER — Other Ambulatory Visit (HOSPITAL_COMMUNITY): Payer: Self-pay

## 2022-08-26 ENCOUNTER — Observation Stay (HOSPITAL_COMMUNITY): Payer: Medicaid Other

## 2022-08-26 DIAGNOSIS — D509 Iron deficiency anemia, unspecified: Secondary | ICD-10-CM | POA: Diagnosis not present

## 2022-08-26 DIAGNOSIS — R7401 Elevation of levels of liver transaminase levels: Secondary | ICD-10-CM | POA: Diagnosis present

## 2022-08-26 DIAGNOSIS — R630 Anorexia: Secondary | ICD-10-CM | POA: Diagnosis present

## 2022-08-26 DIAGNOSIS — Z79899 Other long term (current) drug therapy: Secondary | ICD-10-CM | POA: Diagnosis not present

## 2022-08-26 DIAGNOSIS — I428 Other cardiomyopathies: Secondary | ICD-10-CM | POA: Diagnosis not present

## 2022-08-26 DIAGNOSIS — R0602 Shortness of breath: Secondary | ICD-10-CM | POA: Diagnosis not present

## 2022-08-26 DIAGNOSIS — G894 Chronic pain syndrome: Secondary | ICD-10-CM | POA: Diagnosis present

## 2022-08-26 DIAGNOSIS — E119 Type 2 diabetes mellitus without complications: Secondary | ICD-10-CM | POA: Diagnosis not present

## 2022-08-26 DIAGNOSIS — I509 Heart failure, unspecified: Secondary | ICD-10-CM | POA: Diagnosis not present

## 2022-08-26 DIAGNOSIS — R4781 Slurred speech: Secondary | ICD-10-CM | POA: Diagnosis not present

## 2022-08-26 DIAGNOSIS — K828 Other specified diseases of gallbladder: Secondary | ICD-10-CM | POA: Diagnosis present

## 2022-08-26 DIAGNOSIS — Z8249 Family history of ischemic heart disease and other diseases of the circulatory system: Secondary | ICD-10-CM | POA: Diagnosis not present

## 2022-08-26 DIAGNOSIS — I82432 Acute embolism and thrombosis of left popliteal vein: Secondary | ICD-10-CM | POA: Diagnosis not present

## 2022-08-26 DIAGNOSIS — J9811 Atelectasis: Secondary | ICD-10-CM | POA: Diagnosis not present

## 2022-08-26 DIAGNOSIS — F418 Other specified anxiety disorders: Secondary | ICD-10-CM | POA: Diagnosis not present

## 2022-08-26 DIAGNOSIS — K5900 Constipation, unspecified: Secondary | ICD-10-CM | POA: Diagnosis present

## 2022-08-26 DIAGNOSIS — R609 Edema, unspecified: Secondary | ICD-10-CM | POA: Diagnosis not present

## 2022-08-26 DIAGNOSIS — M7989 Other specified soft tissue disorders: Secondary | ICD-10-CM | POA: Diagnosis present

## 2022-08-26 DIAGNOSIS — I10 Essential (primary) hypertension: Secondary | ICD-10-CM | POA: Diagnosis not present

## 2022-08-26 DIAGNOSIS — D573 Sickle-cell trait: Secondary | ICD-10-CM | POA: Diagnosis not present

## 2022-08-26 DIAGNOSIS — F419 Anxiety disorder, unspecified: Secondary | ICD-10-CM | POA: Diagnosis present

## 2022-08-26 DIAGNOSIS — D6859 Other primary thrombophilia: Secondary | ICD-10-CM | POA: Diagnosis not present

## 2022-08-26 DIAGNOSIS — F32A Depression, unspecified: Secondary | ICD-10-CM | POA: Diagnosis not present

## 2022-08-26 DIAGNOSIS — R0609 Other forms of dyspnea: Secondary | ICD-10-CM | POA: Diagnosis not present

## 2022-08-26 DIAGNOSIS — E785 Hyperlipidemia, unspecified: Secondary | ICD-10-CM | POA: Diagnosis not present

## 2022-08-26 DIAGNOSIS — R110 Nausea: Secondary | ICD-10-CM | POA: Diagnosis present

## 2022-08-26 DIAGNOSIS — K761 Chronic passive congestion of liver: Secondary | ICD-10-CM | POA: Diagnosis not present

## 2022-08-26 DIAGNOSIS — Z7901 Long term (current) use of anticoagulants: Secondary | ICD-10-CM | POA: Diagnosis not present

## 2022-08-26 DIAGNOSIS — K219 Gastro-esophageal reflux disease without esophagitis: Secondary | ICD-10-CM | POA: Diagnosis present

## 2022-08-26 DIAGNOSIS — I110 Hypertensive heart disease with heart failure: Secondary | ICD-10-CM | POA: Diagnosis not present

## 2022-08-26 DIAGNOSIS — I5021 Acute systolic (congestive) heart failure: Secondary | ICD-10-CM | POA: Diagnosis not present

## 2022-08-26 DIAGNOSIS — F17210 Nicotine dependence, cigarettes, uncomplicated: Secondary | ICD-10-CM | POA: Diagnosis present

## 2022-08-26 DIAGNOSIS — R059 Cough, unspecified: Secondary | ICD-10-CM | POA: Diagnosis not present

## 2022-08-26 LAB — COMPREHENSIVE METABOLIC PANEL
ALT: 158 U/L — ABNORMAL HIGH (ref 0–44)
AST: 79 U/L — ABNORMAL HIGH (ref 15–41)
Albumin: 3 g/dL — ABNORMAL LOW (ref 3.5–5.0)
Alkaline Phosphatase: 89 U/L (ref 38–126)
Anion gap: 10 (ref 5–15)
BUN: 13 mg/dL (ref 6–20)
CO2: 19 mmol/L — ABNORMAL LOW (ref 22–32)
Calcium: 8.8 mg/dL — ABNORMAL LOW (ref 8.9–10.3)
Chloride: 106 mmol/L (ref 98–111)
Creatinine, Ser: 0.8 mg/dL (ref 0.44–1.00)
GFR, Estimated: >60 mL/min (ref >60)
Glucose, Bld: 138 mg/dL — ABNORMAL HIGH (ref 70–99)
Potassium: 4.2 mmol/L (ref 3.5–5.1)
Sodium: 135 mmol/L (ref 135–145)
Total Bilirubin: 0.6 mg/dL (ref 0.3–1.2)
Total Protein: 6.3 g/dL — ABNORMAL LOW (ref 6.5–8.1)

## 2022-08-26 LAB — CBC
HCT: 36.2 % (ref 36.0–46.0)
Hemoglobin: 11.4 g/dL — ABNORMAL LOW (ref 12.0–15.0)
MCH: 27 pg (ref 26.0–34.0)
MCHC: 31.5 g/dL (ref 30.0–36.0)
MCV: 85.8 fL (ref 80.0–100.0)
Platelets: 195 10*3/uL (ref 150–400)
RBC: 4.22 MIL/uL (ref 3.87–5.11)
RDW: 16.2 % — ABNORMAL HIGH (ref 11.5–15.5)
WBC: 4.7 10*3/uL (ref 4.0–10.5)
nRBC: 1.3 % — ABNORMAL HIGH (ref 0.0–0.2)

## 2022-08-26 LAB — ECHOCARDIOGRAM COMPLETE
AR max vel: 2.11 cm2
AV Area VTI: 1.66 cm2
AV Area mean vel: 1.75 cm2
AV Mean grad: 2 mmHg
AV Peak grad: 3.4 mmHg
Ao pk vel: 0.92 m/s
Area-P 1/2: 4.77 cm2
Est EF: <20
Height: 65 in
S' Lateral: 5.5 cm
Single Plane A4C EF: 21 %
Weight: 2585.55 oz

## 2022-08-26 LAB — GLUCOSE, CAPILLARY: Glucose-Capillary: 168 mg/dL — ABNORMAL HIGH (ref 70–99)

## 2022-08-26 LAB — HEPATITIS PANEL, ACUTE
HCV Ab: NONREACTIVE
Hep A IgM: NONREACTIVE
Hep B C IgM: NONREACTIVE
Hepatitis B Surface Ag: NONREACTIVE

## 2022-08-26 LAB — ACETAMINOPHEN LEVEL: Acetaminophen (Tylenol), Serum: <10 ug/mL — ABNORMAL LOW (ref 10–30)

## 2022-08-26 LAB — HIV ANTIBODY (ROUTINE TESTING W REFLEX): HIV Screen 4th Generation wRfx: NONREACTIVE

## 2022-08-26 LAB — TROPONIN I (HIGH SENSITIVITY): Troponin I (High Sensitivity): 12 ng/L (ref <18)

## 2022-08-26 LAB — CBG MONITORING, ED: Glucose-Capillary: 143 mg/dL — ABNORMAL HIGH (ref 70–99)

## 2022-08-26 MED ORDER — FUROSEMIDE 10 MG/ML IJ SOLN
60.0000 mg | Freq: Two times a day (BID) | INTRAMUSCULAR | Status: DC
  Administered 2022-08-26 – 2022-08-27 (×2): 60 mg via INTRAVENOUS
  Filled 2022-08-26 (×2): qty 6

## 2022-08-26 MED ORDER — ACETAMINOPHEN 325 MG PO TABS
650.0000 mg | ORAL_TABLET | Freq: Four times a day (QID) | ORAL | Status: DC | PRN
  Filled 2022-08-26: qty 2

## 2022-08-26 MED ORDER — LOSARTAN POTASSIUM 25 MG PO TABS
25.0000 mg | ORAL_TABLET | Freq: Every day | ORAL | Status: DC
  Administered 2022-08-26 – 2022-08-27 (×2): 25 mg via ORAL
  Filled 2022-08-26 (×2): qty 1

## 2022-08-26 MED ORDER — PANTOPRAZOLE SODIUM 40 MG PO TBEC
40.0000 mg | DELAYED_RELEASE_TABLET | Freq: Every day | ORAL | Status: DC
  Administered 2022-08-26 – 2022-08-31 (×6): 40 mg via ORAL
  Filled 2022-08-26 (×6): qty 1

## 2022-08-26 MED ORDER — ENOXAPARIN SODIUM 40 MG/0.4ML IJ SOSY
40.0000 mg | PREFILLED_SYRINGE | INTRAMUSCULAR | Status: DC
  Administered 2022-08-26: 40 mg via SUBCUTANEOUS
  Filled 2022-08-26: qty 0.4

## 2022-08-26 MED ORDER — AMITRIPTYLINE HCL 50 MG PO TABS
75.0000 mg | ORAL_TABLET | Freq: Every day | ORAL | Status: DC
  Administered 2022-08-26 – 2022-08-30 (×6): 75 mg via ORAL
  Filled 2022-08-26 (×2): qty 2
  Filled 2022-08-26: qty 3
  Filled 2022-08-26 (×3): qty 2

## 2022-08-26 MED ORDER — FUROSEMIDE 10 MG/ML IJ SOLN
40.0000 mg | Freq: Once | INTRAMUSCULAR | Status: AC
  Administered 2022-08-26: 40 mg via INTRAVENOUS
  Filled 2022-08-26: qty 4

## 2022-08-26 MED ORDER — ACETAMINOPHEN 650 MG RE SUPP: 650.0000 mg | Freq: Four times a day (QID) | RECTAL | Status: DC | PRN

## 2022-08-26 MED ORDER — PERFLUTREN LIPID MICROSPHERE
1.0000 mL | INTRAVENOUS | Status: AC | PRN
  Administered 2022-08-26: 2 mL via INTRAVENOUS

## 2022-08-26 MED ORDER — FUROSEMIDE 10 MG/ML IJ SOLN
40.0000 mg | Freq: Two times a day (BID) | INTRAMUSCULAR | Status: DC
  Administered 2022-08-26: 40 mg via INTRAVENOUS
  Filled 2022-08-26: qty 4

## 2022-08-26 MED ORDER — ROSUVASTATIN CALCIUM 5 MG PO TABS
5.0000 mg | ORAL_TABLET | Freq: Every day | ORAL | Status: DC
  Administered 2022-08-26 – 2022-08-31 (×6): 5 mg via ORAL
  Filled 2022-08-26 (×7): qty 1

## 2022-08-26 MED ORDER — SODIUM CHLORIDE 0.9% FLUSH: 10.0000 mL | INTRAVENOUS | Status: DC | PRN

## 2022-08-26 MED ORDER — INSULIN ASPART 100 UNIT/ML IJ SOLN: 0.0000 [IU] | Freq: Every day | INTRAMUSCULAR | Status: DC

## 2022-08-26 MED ORDER — SPIRONOLACTONE 12.5 MG HALF TABLET
12.5000 mg | ORAL_TABLET | Freq: Every day | ORAL | Status: DC
  Administered 2022-08-27 – 2022-08-29 (×3): 12.5 mg via ORAL
  Filled 2022-08-26 (×4): qty 1

## 2022-08-26 MED ORDER — ALBUTEROL SULFATE (2.5 MG/3ML) 0.083% IN NEBU: 2.5000 mg | INHALATION_SOLUTION | Freq: Four times a day (QID) | RESPIRATORY_TRACT | Status: DC | PRN

## 2022-08-26 MED ORDER — DAPAGLIFLOZIN PROPANEDIOL 10 MG PO TABS
10.0000 mg | ORAL_TABLET | Freq: Every day | ORAL | Status: DC
  Administered 2022-08-26 – 2022-08-31 (×6): 10 mg via ORAL
  Filled 2022-08-26 (×6): qty 1

## 2022-08-26 MED ORDER — SODIUM CHLORIDE 0.9% FLUSH
3.0000 mL | Freq: Two times a day (BID) | INTRAVENOUS | Status: DC
  Administered 2022-08-26: 3 mL via INTRAVENOUS

## 2022-08-26 MED ORDER — POLYETHYLENE GLYCOL 3350 17 G PO PACK
17.0000 g | PACK | Freq: Every day | ORAL | Status: DC | PRN
  Administered 2022-08-29: 17 g via ORAL
  Filled 2022-08-26: qty 1

## 2022-08-26 MED ORDER — CHLORHEXIDINE GLUCONATE CLOTH 2 % EX PADS
6.0000 | MEDICATED_PAD | Freq: Every day | CUTANEOUS | Status: DC
  Administered 2022-08-27 – 2022-08-31 (×5): 6 via TOPICAL

## 2022-08-26 MED ORDER — INSULIN ASPART 100 UNIT/ML IJ SOLN: 0.0000 [IU] | Freq: Three times a day (TID) | INTRAMUSCULAR | Status: DC

## 2022-08-26 MED ORDER — GABAPENTIN 300 MG PO CAPS
300.0000 mg | ORAL_CAPSULE | Freq: Two times a day (BID) | ORAL | Status: DC
  Administered 2022-08-26 – 2022-08-31 (×12): 300 mg via ORAL
  Filled 2022-08-26 (×12): qty 1

## 2022-08-26 MED ORDER — INSULIN ASPART 100 UNIT/ML IJ SOLN
0.0000 [IU] | Freq: Three times a day (TID) | INTRAMUSCULAR | Status: DC
  Administered 2022-08-26 – 2022-08-27 (×2): 2 [IU] via SUBCUTANEOUS
  Administered 2022-08-28: 1 [IU] via SUBCUTANEOUS
  Administered 2022-08-28: 2 [IU] via SUBCUTANEOUS
  Administered 2022-08-29 (×2): 1 [IU] via SUBCUTANEOUS
  Administered 2022-08-30 (×3): 2 [IU] via SUBCUTANEOUS
  Administered 2022-08-31 (×2): 1 [IU] via SUBCUTANEOUS

## 2022-08-26 MED ORDER — SODIUM CHLORIDE 0.9% FLUSH
10.0000 mL | Freq: Two times a day (BID) | INTRAVENOUS | Status: DC
  Administered 2022-08-26: 10 mL
  Administered 2022-08-27: 20 mL
  Administered 2022-08-27: 10 mL
  Administered 2022-08-28 (×2): 20 mL
  Administered 2022-08-30 – 2022-08-31 (×3): 10 mL

## 2022-08-26 MED ORDER — HYDROXYZINE HCL 25 MG PO TABS: 25.0000 mg | ORAL_TABLET | Freq: Three times a day (TID) | ORAL | Status: DC | PRN

## 2022-08-26 NOTE — ED Notes (Signed)
Patient transported to US 

## 2022-08-26 NOTE — Progress Notes (Signed)
[facility]                                                                              Kolby Shad, is a 57 y.o. female, DOB - [birth year], ZOX:[id number] Admit date - 08/25/2022    Outpatient Primary MD for the patient is Claiborne Rigg, NP  LOS - 0  days  Chief Complaint  Patient presents with   Depression   nausea       Brief summary   Patient is a 57 year old female with history of diabetes mellitus, HTN, HLP, bipolar disorder, chronic pain, depression/anxiety presented to ED with multiple complaints including loss of appetite, nausea, shortness of breath with exertion, lower extremity swelling.  Patient reported approximately 1 month of poor appetite and nausea, new onset of bilateral lower leg swelling over the past week or so.  She reported that in the last week she is having worsening exertional dyspnea and difficulty sleeping.  Denied any chest pain, abdominal pain or fevers. In ED, afebrile, O2 sats 100% on room air, BP 133/98, RR 18, pulse 95 Chest x-ray showed cardiomegaly, mildly elevated LFTs, BNP 2503 Received Lasix 40 mg IV x 1 in ED, cardiology was consulted.   Assessment & Plan    Principal Problem:   New onset of congestive heart failure (HCC), acute systolic and diastolic, biventricular -Presented with volume overload, shortness of breath, lower extremity edema, elevated BNP, cardiomegaly on chest x-ray -Started on Lasix 40 mg IV twice daily -Strict I's and O's and daily weights -2D echo this morning showed EF less than 20%, global hypokinesis, grade 3 DD, right ventricular systolic function moderately reduced, moderately elevated pulmonary artery systolic pressure, moderate TR. -Cardiology consulted, will await recommendation, will likely need right and left heart cath   Active Problems:   Elevated transaminase level -Likely due to #1, congestive hepatopathy.  Acute hepatitis panel pending -RUQ ultrasound showed gallbladder sludge  without sonographic evidence of acute cholecystitis -Currently no abdominal pain, LFTs improving    Essential hypertension, benign -Continue IV Lasix, losartan    Chronic pain syndrome -Continue amitriptyline, gabapentin  Diet-controlled diabetes mellitus -Hemoglobin A1c 6.5 on 05/18/2022, repeat A1c -Placed on sliding scale insulin while inpatient CBG (last 3)  Recent Labs    08/26/22 0220  GLUCAP 143*   Depression with anxiety -Continue amitriptyline, hydroxyzine as needed  Estimated body mass index is 26.89 kg/m as calculated from the following:   Height as of this encounter: 5\' 5"  (1.651 m).   Weight as of this encounter: 73.3 kg.  Code Status: Full code DVT Prophylaxis:  enoxaparin (LOVENOX) injection 40 mg Start: 08/26/22 1000   Level of Care: Level of care: Telemetry Cardiac Family Communication: Updated patient Disposition Plan:      Remains inpatient appropriate: Need cardiac workup   Procedures:  2D echo  Consultants:   Cardiology  Antimicrobials: None  Medications  amitriptyline  75 mg Oral QHS   enoxaparin (LOVENOX) injection  40 mg Subcutaneous Q24H   furosemide  40 mg Intravenous BID   gabapentin  300 mg Oral BID   insulin aspart  0-5 Units Subcutaneous QHS   insulin  aspart  0-6 Units Subcutaneous TID WC   losartan  25 mg Oral Daily   pantoprazole  40 mg Oral Daily   rosuvastatin  5 mg Oral Daily   sodium chloride flush  3 mL Intravenous Q12H      Subjective:   Xia Belyeu was seen and examined today.  Still somewhat short of breath with significant volume overload, lower extremity swelling.  Currently no chest pain, nausea vomiting or abdominal pain.  No fevers Objective:   Vitals:   08/26/22 0400 08/26/22 0428 08/26/22 0612 08/26/22 0831  BP: (!) 123/91  (!) 123/96 (!) 130/99  Pulse: 91  91 97  Resp: (!) 29  20 18   Temp:  97.8 F (36.6 C) (!) 97.5 F (36.4 C) (!) 97.5 F (36.4 C)  TempSrc:  Oral Oral Oral  SpO2: 100%  100% 97%   Weight:   73.3 kg   Height:        Intake/Output Summary (Last 24 hours) at 08/26/2022 1030 Last data filed at 08/26/2022 0835 Gross per 24 hour  Intake 120 ml  Output --  Net 120 ml     Wt Readings from Last 3 Encounters:  08/26/22 73.3 kg  08/17/22 75.8 kg  05/18/22 76.7 kg     Exam General: Alert and oriented x 3, NAD HEENT: JVD+ Cardiovascular: S1 S2 auscultated,  RRR Respiratory: Bibasilar crackles Gastrointestinal: Soft, nontender, nondistended, + bowel sounds Ext: + pedal edema bilaterally Neuro: no new deficits Psych: Normal affect     Data Reviewed:  I have personally reviewed following labs    CBC Lab Results  Component Value Date   WBC 4.7 08/26/2022   RBC 4.22 08/26/2022   HGB 11.4 (L) 08/26/2022   HCT 36.2 08/26/2022   MCV 85.8 08/26/2022   MCH 27.0 08/26/2022   PLT 195 08/26/2022   MCHC 31.5 08/26/2022   RDW 16.2 (H) 08/26/2022   LYMPHSABS 0.7 08/25/2022   MONOABS 0.2 08/25/2022   EOSABS 0.0 08/25/2022   BASOSABS 0.0 08/25/2022     Last metabolic panel Lab Results  Component Value Date   NA 135 08/26/2022   K 4.2 08/26/2022   CL 106 08/26/2022   CO2 19 (L) 08/26/2022   BUN 13 08/26/2022   CREATININE 0.80 08/26/2022   GLUCOSE 138 (H) 08/26/2022   GFRNONAA >60 08/26/2022   GFRAA 81 02/24/2020   CALCIUM 8.8 (L) 08/26/2022   PROT 6.3 (L) 08/26/2022   ALBUMIN 3.0 (L) 08/26/2022   LABGLOB 2.5 05/18/2022   AGRATIO 1.8 05/18/2022   BILITOT 0.6 08/26/2022   ALKPHOS 89 08/26/2022   AST 79 (H) 08/26/2022   ALT 158 (H) 08/26/2022   ANIONGAP 10 08/26/2022    CBG (last 3)  Recent Labs    08/26/22 0220  GLUCAP 143*      Coagulation Profile: No results for input(s): "INR", "PROTIME" in the last 168 hours.   Radiology Studies: I have personally reviewed the imaging studies  ECHOCARDIOGRAM COMPLETE  Result Date: 08/26/2022    ECHOCARDIOGRAM REPORT   Patient Name:   LYNETTA SEPPI Date of Exam: 08/26/2022 Medical Rec #:   [id number]       Height:       65.0 in Accession #:    [id number]      Weight:       161.6 lb Date of Birth:  09/16/1965       BSA:          1.807 m Patient Age:  56 years        BP:           130/99 mmHg Patient Gender: F               HR:           98 bpm. Exam Location:  Inpatient Procedure: 2D Echo, Cardiac Doppler, Color Doppler and Intracardiac            Opacification Agent Indications:    Dyspnea  History:        Patient has no prior history of Echocardiogram examinations.                 Risk Factors:Hypertension and Diabetes.  Sonographer:    Darlys Gales Referring Phys: [id number] TIMOTHY S OPYD IMPRESSIONS  1. Left ventricular ejection fraction, by estimation, is <20%. The left ventricle has severely decreased function. The left ventricle demonstrates global hypokinesis. The left ventricular internal cavity size was moderately dilated. Left ventricular diastolic parameters are consistent with Grade III diastolic dysfunction (restrictive).  2. Right ventricular systolic function is moderately reduced. The right ventricular size is normal. There is moderately elevated pulmonary artery systolic pressure. The estimated right ventricular systolic pressure is 58.0 mmHg.  3. Left atrial size was moderately dilated.  4. Right atrial size was mildly dilated.  5. The mitral valve is normal in structure. Mild mitral valve regurgitation. No evidence of mitral stenosis.  6. Tricuspid valve regurgitation is moderate.  7. The aortic valve is tricuspid. There is mild calcification of the aortic valve. Aortic valve regurgitation is not visualized. No aortic stenosis is present.  8. The inferior vena cava is dilated in size with <50% respiratory variability, suggesting right atrial pressure of 15 mmHg. FINDINGS  Left Ventricle: Left ventricular ejection fraction, by estimation, is <20%. The left ventricle has severely decreased function. The left ventricle demonstrates global hypokinesis. Definity contrast agent was given  IV to delineate the left ventricular endocardial borders. The left ventricular internal cavity size was moderately dilated. There is no left ventricular hypertrophy. Left ventricular diastolic parameters are consistent with Grade III diastolic dysfunction (restrictive). Right Ventricle: The right ventricular size is normal. No increase in right ventricular wall thickness. Right ventricular systolic function is moderately reduced. There is moderately elevated pulmonary artery systolic pressure. The tricuspid regurgitant velocity is 3.28 m/s, and with an assumed right atrial pressure of 15 mmHg, the estimated right ventricular systolic pressure is 58.0 mmHg. Left Atrium: Left atrial size was moderately dilated. Right Atrium: Right atrial size was mildly dilated. Pericardium: There is no evidence of pericardial effusion. Mitral Valve: The mitral valve is normal in structure. Mild mitral valve regurgitation. No evidence of mitral valve stenosis. Tricuspid Valve: The tricuspid valve is normal in structure. Tricuspid valve regurgitation is moderate . No evidence of tricuspid stenosis. Aortic Valve: The aortic valve is tricuspid. There is mild calcification of the aortic valve. Aortic valve regurgitation is not visualized. No aortic stenosis is present. Aortic valve mean gradient measures 2.0 mmHg. Aortic valve peak gradient measures 3.4 mmHg. Aortic valve area, by VTI measures 1.66 cm. Pulmonic Valve: The pulmonic valve was normal in structure. Pulmonic valve regurgitation is mild. No evidence of pulmonic stenosis. Aorta: The aortic root is normal in size and structure. Venous: The inferior vena cava is dilated in size with less than 50% respiratory variability, suggesting right atrial pressure of 15 mmHg. IAS/Shunts: No atrial level shunt detected by color flow Doppler.  LEFT VENTRICLE PLAX 2D LVIDd:  5.75 cm      Diastology LVIDs:         5.50 cm      LV e' medial:    2.72 cm/s LV PW:         0.85 cm      LV  E/e' medial:  30.8 LV IVS:        0.50 cm      LV e' lateral:   9.79 cm/s LVOT diam:     1.80 cm      LV E/e' lateral: 8.6 LV SV:         20 LV SV Index:   11 LVOT Area:     2.54 cm  LV Volumes (MOD) LV vol d, MOD A4C: 124.0 ml LV vol s, MOD A4C: 98.0 ml LV SV MOD A4C:     124.0 ml RIGHT VENTRICLE RV S prime:     8.27 cm/s TAPSE (M-mode): 0.6 cm LEFT ATRIUM           Index        RIGHT ATRIUM           Index LA Vol (A4C): 83.6 ml 46.24 ml/m  RA Area:     16.30 cm                                    RA Volume:   39.90 ml  22.08 ml/m  AORTIC VALVE AV Area (Vmax):    2.11 cm AV Area (Vmean):   1.75 cm AV Area (VTI):     1.66 cm AV Vmax:           91.60 cm/s AV Vmean:          70.600 cm/s AV VTI:            0.119 m AV Peak Grad:      3.4 mmHg AV Mean Grad:      2.0 mmHg LVOT Vmax:         75.80 cm/s LVOT Vmean:        48.600 cm/s LVOT VTI:          0.078 m LVOT/AV VTI ratio: 0.65  AORTA Ao Root diam: 2.10 cm Ao Asc diam:  2.50 cm MITRAL VALVE               TRICUSPID VALVE MV Area (PHT): 4.77 cm    TR Peak grad:   43.0 mmHg MV Decel Time: 159 msec    TR Vmax:        328.00 cm/s MV E velocity: 83.80 cm/s                            SHUNTS                            Systemic VTI:  0.08 m                            Systemic Diam: 1.80 cm Arvilla Meres MD Electronically signed by Arvilla Meres MD Signature Date/Time: 08/26/2022/10:15:44 AM    Final    US Abdomen Limited RUQ (LIVER/GB)  Result Date: 08/26/2022 CLINICAL DATA:  Elevated liver function tests EXAM: ULTRASOUND ABDOMEN LIMITED RIGHT UPPER QUADRANT COMPARISON:  None Available. FINDINGS: Gallbladder: Layering sludge is seen within  the gallbladder. The gallbladder, however, is not distended, there is no gallbladder wall thickening, and no pericholecystic fluid is identified. The sonographic Eulah Pont sign is reportedly negative. Common bile duct: Diameter: 4 mm in proximal diameter Liver: No focal lesion identified. Within normal limits in parenchymal  echogenicity. Portal vein is patent on color Doppler imaging with normal direction of blood flow towards the liver. Other: None. IMPRESSION: 1. Gallbladder sludge without sonographic evidence of acute cholecystitis. Electronically Signed   By: Helyn Numbers M.D.   On: 08/26/2022 02:02   DG Chest 2 View  Result Date: 08/25/2022 CLINICAL DATA:  Cough EXAM: CHEST - 2 VIEW COMPARISON:  12/10/2017 FINDINGS: Interval development of mild cardiomegaly. Mild right basilar atelectasis. No pneumothorax or pleural effusion. Pulmonary vascularity is normal. No acute bone abnormality. IMPRESSION: 1. Mild cardiomegaly. 2. Mild right basilar atelectasis. Electronically Signed   By: Helyn Numbers M.D.   On: 08/25/2022 21:33   CT Head Wo Contrast  Result Date: 08/25/2022 CLINICAL DATA:  Transient slurred speech. EXAM: CT HEAD WITHOUT CONTRAST TECHNIQUE: Contiguous axial images were obtained from the base of the skull through the vertex without intravenous contrast. RADIATION DOSE REDUCTION: This exam was performed according to the departmental dose-optimization program which includes automated exposure control, adjustment of the mA and/or kV according to patient size and/or use of iterative reconstruction technique. COMPARISON:  MRI examination dated May 05, 2022 FINDINGS: Brain: No evidence of acute infarction, hemorrhage, hydrocephalus, extra-axial collection or mass lesion/mass effect. Vascular: No hyperdense vessel or unexpected calcification. Skull: Normal. Negative for fracture or focal lesion. Sinuses/Orbits: No acute finding. Other: None. IMPRESSION: No acute intracranial pathology. Electronically Signed   By: Larose Hires D.O.   On: 08/25/2022 18:09       Halei Hanover M.D. [facility] 08/26/2022, 10:30 AM  Available via Epic secure chat 7am-7pm After 7 pm, please refer to night coverage provider listed on amion.

## 2022-08-26 NOTE — ED Provider Notes (Incomplete)
[place] EMERGENCY DEPARTMENT AT [facility] Provider Note   CSN: [id number] Arrival date & time: 08/25/22  1645     History {Add pertinent medical, surgical, social history, OB history to HPI:1} Chief Complaint  Patient presents with  . Depression  . nausea    Kendra Griffith is a 57 y.o. female.  57 year old female history of anxiety, bipolar disorder, diabetes, GERD, hypertension presenting for multiple concerns.   Depression       Home Medications Prior to Admission medications   Medication Sig Start Date End Date Taking? Authorizing Provider  acetaminophen-codeine (TYLENOL #3) 300-30 MG tablet Take 1-2 tablets by mouth every 4 (four) hours as needed for moderate pain. 07/08/22   Claiborne Rigg, NP  albuterol (VENTOLIN HFA) 108 (90 Base) MCG/ACT inhaler Inhale 2 puffs into the lungs every 6 (six) hours as needed for wheezing or shortness of breath. 07/08/22   Claiborne Rigg, NP  amitriptyline (ELAVIL) 75 MG tablet Take 1 tablet (75 mg total) by mouth at bedtime for pain and depression 08/17/22   Claiborne Rigg, NP  Biotin 5 MG CAPS Take 5 mg by mouth 3 (three) times daily.    [provider]  chlorhexidine (HIBICLENS) 4 % external liquid Apply topically daily as needed. Use when bathing or showering 10/07/20   Claiborne Rigg, NP  cholecalciferol (VITAMIN D3) 25 MCG (1000 UT) tablet Take 1,000 Units by mouth daily.    [provider]  famotidine (PEPCID) 20 MG tablet Take 1 tablet (20 mg total) by mouth 2 (two) times daily for heartburn 08/17/22   Claiborne Rigg, NP  gabapentin (NEURONTIN) 300 MG capsule Take 1 capsule (300 mg total) by mouth 2 (two) times daily. 03/15/22   Claiborne Rigg, NP  gentamicin (GARAMYCIN) 0.3 % ophthalmic solution Place 1 drop into the left eye 4 (four) times daily. 07/07/22   Anders Simmonds, PA-C  GLUTAMINE PO Take 500 mg by mouth.    [provider]  hydroquinone 4 % cream Apply topically 2 (two)  times daily. 05/22/20   [provider]  hydrOXYzine (ATARAX) 25 MG tablet Take 1 tablet (25 mg total) by mouth 3 (three) times daily as needed. 07/08/22   Claiborne Rigg, NP  ibuprofen (ADVIL) 800 MG tablet Take 1 tablet (800 mg total) by mouth every 8 (eight) hours as needed. 01/25/21   Claiborne Rigg, NP  losartan (COZAAR) 25 MG tablet Take 1 tablet (25 mg total) by mouth daily for blood pressure 08/17/22   Claiborne Rigg, NP  Misc. Devices MISC Please provide patient with incontinence pads or disposable underwear or choice. N39.3 05/18/22   Claiborne Rigg, NP  nicotine (NICODERM CQ - DOSED IN MG/24 HOURS) 21 mg/24hr patch Place 1 patch (21 mg total) onto the skin daily. 08/17/22 09/28/22  Claiborne Rigg, NP  nicotine polacrilex (NICOTINE MINI) 4 MG lozenge Dissolve 1 lozenge (4 mg total) by mouth as needed for smoking cessation. 08/17/22   Claiborne Rigg, NP  NONFORMULARY OR COMPOUNDED ITEM Peripheral Neuropathy Cream: Bupivacaine 1%, Doxepin 3%, Gabapentin 6%, Pentoxifylline 3%, Topiramate 1% Order faxed to [facility] 10/02/19   Freddie Breech, DPM  omeprazole (PRILOSEC) 20 MG capsule Take 1 capsule (20 mg total) by mouth daily for heartburn 08/17/22   Claiborne Rigg, NP  rosuvastatin (CRESTOR) 5 MG tablet Take 1 tablet (5 mg total) by mouth daily for cholesterol 08/17/22   Claiborne Rigg, NP  vitamin B-12 (CYANOCOBALAMIN) 500 MCG tablet Take 500 mcg by mouth daily.    [provider]  vitamin C (ASCORBIC ACID) 500 MG tablet Take 500 mg by mouth daily.    [provider]      Allergies    Amoxicillin, Darvocet [propoxyphene n-acetaminophen], Hydrocodone-acetaminophen, Percocet [oxycodone-acetaminophen], and Valium [diazepam]    Review of Systems   Review of Systems  Psychiatric/Behavioral:  Positive for depression.     Physical Exam Updated Vital Signs BP (!) 132/97 (BP Location: Right Arm)   Pulse 96   Temp (!) 97.5 F (36.4 C) (Oral)    Resp 16   Ht 5\' 5"  (1.651 m)   Wt 75.8 kg   LMP 03/12/2019 (Approximate)   SpO2 99%   BMI 27.81 kg/m  Physical Exam  ED Results / Procedures / Treatments   Labs (all labs ordered are listed, but only abnormal results are displayed) Labs Reviewed  COMPREHENSIVE METABOLIC PANEL - Abnormal; Notable for the following components:      Result Value   Glucose, Bld 179 (*)    Albumin 3.2 (*)    AST 98 (*)    ALT 174 (*)    All other components within normal limits  CBC WITH DIFFERENTIAL/PLATELET - Abnormal; Notable for the following components:   WBC 3.8 (*)    Hemoglobin 11.3 (*)    MCHC 29.9 (*)    RDW 16.3 (*)    nRBC 1.1 (*)    All other components within normal limits  URINALYSIS, ROUTINE W REFLEX MICROSCOPIC - Abnormal; Notable for the following components:   Color, Urine AMBER (*)    APPearance CLOUDY (*)    Ketones, ur 5 (*)    Protein, ur >=300 (*)    Bacteria, UA FEW (*)    All other components within normal limits  LIPASE, BLOOD    EKG None  Radiology CT Head Wo Contrast  Result Date: 08/25/2022 CLINICAL DATA:  Transient slurred speech. EXAM: CT HEAD WITHOUT CONTRAST TECHNIQUE: Contiguous axial images were obtained from the base of the skull through the vertex without intravenous contrast. RADIATION DOSE REDUCTION: This exam was performed according to the departmental dose-optimization program which includes automated exposure control, adjustment of the mA and/or kV according to patient size and/or use of iterative reconstruction technique. COMPARISON:  MRI examination dated May 05, 2022 FINDINGS: Brain: No evidence of acute infarction, hemorrhage, hydrocephalus, extra-axial collection or mass lesion/mass effect. Vascular: No hyperdense vessel or unexpected calcification. Skull: Normal. Negative for fracture or focal lesion. Sinuses/Orbits: No acute finding. Other: None. IMPRESSION: No acute intracranial pathology. Electronically Signed   By: Larose Hires D.O.   On:  08/25/2022 18:09    Procedures Procedures  {Document cardiac monitor, telemetry assessment procedure when appropriate:1}  Medications Ordered in ED Medications - No data to display  ED Course/ Medical Decision Making/ A&P Clinical Course as of 08/26/22 0002  Thu Aug 25, 2022  2142 CT Head Wo Contrast [JD]    Clinical Course User Index [JD] Fulton Reek, MD   {   Click here for ABCD2, HEART and other calculatorsREFRESH Note before signing :1}                          Medical Decision Making Amount and/or Complexity of Data Reviewed Labs: ordered. Radiology: ordered. Decision-making details documented in ED Course.   ***  {Document critical care time when appropriate:1} {Document review of labs and clinical decision tools  ie heart score, Chads2Vasc2 etc:1}  {Document your independent review of radiology images, and any outside records:1} {Document your discussion with family members, caretakers, and with consultants:1} {Document social determinants of health affecting pt's care:1} {Document your decision making why or why not admission, treatments were needed:1} Final Clinical Impression(s) / ED Diagnoses Final diagnoses:  None    Rx / DC Orders ED Discharge Orders     None

## 2022-08-26 NOTE — Progress Notes (Signed)
   Heart Failure Stewardship Pharmacist Progress Note   PCP: Claiborne Rigg, NP PCP-Cardiologist: None   HPI:  57 YO female with a PMH of anxiety, bipolar disorder, diabetes, GERD, hypertension.   Patient presented to the ED 5/16 with complaints of poor appetite, dyspnea on exertion, cough, difficulty sleeping. She also reports that her legs are more swollen than normal. CXR notable for mild cardiomegaly. Echo from today (5/17) showing EF <20%, global hypokinesis, G3DD, mild MVR, mod TVR, RAP 15 mmHg. Most recent echo prior to this seems to be 11/2009 and showed an EF of 50-55% and no regional wall motion abnormalities.   Current HF Medications: Diuretic: furosemide 40 mg IV x1  Prior to admission HF Medications: ACE/ARB/ARNI: losartan 25 mg daily  Pertinent Lab Values: Serum creatinine 0.80, BUN 13, Potassium 4.2, Sodium 135, BNP 2502, A1c 6.5%  Vital Signs: Weight: 161 lbs (admission weight: 167 lbs) Blood pressure: 130s/90s  Heart rate: 90s  I/O: not yet recorded   Medication Assistance / Insurance Benefits Check: Does the patient have prescription insurance?  Yes Type of insurance plan: Medicaid  Does the patient qualify for medication assistance through manufacturers or grants?   No  Outpatient Pharmacy:  Prior to admission outpatient pharmacy: [facility] Is the patient willing to use [facility] TOC pharmacy at discharge? Yes Is the patient willing to transition their outpatient pharmacy to utilize a [facility] outpatient pharmacy?   Pending    Assessment: 1. Acute systolic CHF (LVEF <20%). NYHA class III-IV symptoms. - Scr 0.95>>0.8 (BL 0.8-1) from yesterday after furosemide 40 mg IV x1. Strict I's and O's. Keep K >4 and Mg >2.  - Recommend increasing furosemide to 60 mg IV BID - Consider starting Farxiga 10 mg daily  - Recommend starting spironolactone 12.5 mg daily if Scr remains stable tomorrow (5/18) - Recommend transitioning PTA losartan to Entresto prior to  discharge - Would continue holding beta blocker therapy given acute exacerbation    Plan: 1) Medication changes recommended at this time: - Start furosemide 60 mg IV BID - Start Farxiga 10 mg daily - Recommend starting spironolactone 12.5 mg daily if Scr remains stable tomorrow (5/18) - Recommend transitioning PTA losartan to Entresto prior to discharge  - F/u Mg in the AM  2) Patient assistance: - Marcelline Deist and Sherryll Burger will require prior authorizations prior to discharge   3)  Education  - To be completed prior to discharge   Cherylin Mylar, PharmD PGY1 Pharmacy Resident 5/17/202410:01 AM

## 2022-08-26 NOTE — Consult Note (Addendum)
Advanced Heart Failure Team Consult Note   Primary Physician: Claiborne Rigg, NP PCP-Cardiologist:  None  Reason for Consultation: Acute combined systolic and diastolic heart failure  HPI:    Kendra Griffith is seen today for evaluation of acute combined systolic and diastolic heart failure at the request of Dr. Isidoro Donning, internal medicine.   Ms. Cranor is a 57 y.o. female with DM, smoker, HTN, HLP, bipolar disorder, chronic pain, hx of ETOH abuse (heavy drinker "gallons/pints" of hard liquor, stopped in 13'), depression, and anxiety.  Presented to the ED 5/16 with weakness, SOB, nausea, loss of appetite and BLE edema. Symptoms had progressively gotten worst over the last month. She recently moved into a new appt and she wasn't expecting stairs but she has to climb some to get to her place and she gets very SOB just with that activity. About 2 months ago she was able to walk 3 laps around her apartment complex, within the last week she attempted one lap and it "wiped her out". Has had no appetite, for the last month she would force herself to eat a meal a day. She has 3 kids, no cardiac history in them. Her mom passed away at 68 from suspected MI. Grandmother and mom with hypertension. Her older sister has a history of CHF. Her father passed away from cancer. 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 Denies sick contacts. Smokes 2 packs a day, has smoked since the age of 53. Has not drank alcohol since 2013. Denies marijuana/cocaine use. She called EMS 5/16 because she knew "she couldn't not eat and had to take care of herself". Denis CP.   In the ED labs significant for K 4.1, Scr .95, AST 98, ALT 174, BNP 2500, CT head (-), CXR: mild right basilar atelectasis, HIV (-). Started on IV lasix. Echo revealed EF <20%, AHF consulted  Assisted to getting [facility] Overall feels ok. Breathing feels better since starting diuresis. Denies CP/SOB.  Cardiac studies  reviewed: Echo 5/24: EF <20%, LV with GHL, GIIIDD, RV mod reduced, mod elevated PASP ~58, LA mod dilated, RA mildly dilated, mild MR, mod TR,  Echo 8/11: EF 50-55%, RV normal  Home Medications Prior to Admission medications   Medication Sig Start Date End Date Taking? Authorizing Provider  acetaminophen-codeine (TYLENOL #3) 300-30 MG tablet Take 1-2 tablets by mouth every 4 (four) hours as needed for moderate pain. 07/08/22  Yes Claiborne Rigg, NP  albuterol (VENTOLIN HFA) 108 (90 Base) MCG/ACT inhaler Inhale 2 puffs into the lungs every 6 (six) hours as needed for wheezing or shortness of breath. 07/08/22  Yes Claiborne Rigg, NP  amitriptyline (ELAVIL) 75 MG tablet Take 1 tablet (75 mg total) by mouth at bedtime for pain and depression 08/17/22  Yes Claiborne Rigg, NP  Biotin 5 MG CAPS Take 5 mg by mouth 3 (three) times daily.   Yes [provider]  cholecalciferol (VITAMIN D3) 25 MCG (1000 UT) tablet Take 1,000 Units by mouth daily.   Yes [provider]  famotidine (PEPCID) 20 MG tablet Take 1 tablet (20 mg total) by mouth 2 (two) times daily for heartburn 08/17/22  Yes Claiborne Rigg, NP  gabapentin (NEURONTIN) 300 MG capsule Take 1 capsule (300 mg total) by mouth 2 (two) times daily. 03/15/22  Yes Claiborne Rigg, NP  GLUTAMINE PO Take 500 mg by mouth daily.   Yes  [provider]  hydrOXYzine (ATARAX) 25 MG tablet Take 1 tablet (25 mg total) by mouth 3 (three) times daily as needed. Patient taking differently: Take 25 mg by mouth 3 (three) times daily as needed for anxiety. 07/08/22  Yes Claiborne Rigg, NP  ibuprofen (ADVIL) 800 MG tablet Take 1 tablet (800 mg total) by mouth every 8 (eight) hours as needed. Patient taking differently: Take 800 mg by mouth every 8 (eight) hours as needed for mild pain. 01/25/21  Yes Claiborne Rigg, NP  losartan (COZAAR) 25 MG tablet Take 1 tablet (25 mg total) by mouth daily for blood pressure Patient taking differently: Take  25 mg by mouth every evening. 08/17/22  Yes Claiborne Rigg, NP  omeprazole (PRILOSEC) 20 MG capsule Take 1 capsule (20 mg total) by mouth daily for heartburn 08/17/22  Yes Claiborne Rigg, NP  rosuvastatin (CRESTOR) 5 MG tablet Take 1 tablet (5 mg total) by mouth daily for cholesterol 08/17/22  Yes Claiborne Rigg, NP  vitamin B-12 (CYANOCOBALAMIN) 500 MCG tablet Take 500 mcg by mouth daily.   Yes [provider]  vitamin C (ASCORBIC ACID) 500 MG tablet Take 500 mg by mouth daily.   Yes [provider]  chlorhexidine (HIBICLENS) 4 % external liquid Apply topically daily as needed. Use when bathing or showering Patient not taking: Reported on 08/26/2022 10/07/20   Claiborne Rigg, NP  gentamicin (GARAMYCIN) 0.3 % ophthalmic solution Place 1 drop into the left eye 4 (four) times daily. Patient not taking: Reported on 08/26/2022 07/07/22   Anders Simmonds, PA-C  hydroquinone 4 % cream Apply topically 2 (two) times daily. Patient not taking: Reported on 08/26/2022 05/22/20   [provider]  Misc. Devices MISC Please provide patient with incontinence pads or disposable underwear or choice. N39.3 05/18/22   Claiborne Rigg, NP  nicotine (NICODERM CQ - DOSED IN MG/24 HOURS) 21 mg/24hr patch Place 1 patch (21 mg total) onto the skin daily. Patient not taking: Reported on 08/26/2022 08/17/22 09/28/22  Claiborne Rigg, NP  nicotine polacrilex (NICOTINE MINI) 4 MG lozenge Dissolve 1 lozenge (4 mg total) by mouth as needed for smoking cessation. Patient not taking: Reported on 08/26/2022 08/17/22   Claiborne Rigg, NP  NONFORMULARY OR COMPOUNDED ITEM Peripheral Neuropathy Cream: Bupivacaine 1%, Doxepin 3%, Gabapentin 6%, Pentoxifylline 3%, Topiramate 1% Order faxed to [facility] Patient not taking: Reported on 08/26/2022 10/02/19   Freddie Breech, DPM    Past Medical History: Past Medical History:  Diagnosis Date   ABSCESS, TOOTH 08/20/2009   Qualifier: Diagnosis of  By:  Brynda Rim     Anemia    Anxiety    Bipolar disorder (HCC)    Blood transfusion without reported diagnosis    childbirth   Chronic pain    feet and back   Clotting disorder (HCC)    childbirth   Dental caries    lesion soft palate   Depression    Diabetes mellitus without complication (HCC)    prediabetes   GERD (gastroesophageal reflux disease)    Hypertension    Injury    right side from jumping off bunkbed   Pre-diabetes    Sickle cell trait (HCC)    Wears glasses     Past Surgical History: Past Surgical History:  Procedure Laterality Date   BARTHOLIN CYST MARSUPIALIZATION N/A 07/16/2019   Procedure: EXCISION OF BARTHOLIN CYST;  Surgeon: Hermina Staggers, MD;  Location: [facility];  Service: Gynecology;  Laterality: N/A;   CESAREAN SECTION     three   COLONOSCOPY     cyst removal 1997     FACIAL RECONSTRUCTION SURGERY     FOOT SURGERY Bilateral    LESION REMOVAL Left 10/15/2018   Procedure: Lesion Removal left soft palate;  Surgeon: Ocie Doyne, DDS;  Location: [facility];  Service: Oral Surgery;  Laterality: Left;   TOOTH EXTRACTION Bilateral 10/15/2018   Procedure: DENTAL EXTRACTIONS OF TEETH NUMBER TWO, FOUR, FIVE, SIX, SEVEN, EIGHT, NINE, TEN, ELEVEN, TWELVE, THIRTEEN, FOURTEEN, SEVENTEEN, TWENTY-ONE, TWENTY-TWO, TWENTY-THREE, TWENTY-FOUR, TWENTY-FIVE, TWENTY-SIX, TWENTY-SEVEN, TWENTY-EIGHT WITH ALVEOLOPLASTY;  Surgeon: Ocie Doyne, DDS;  Location: [facility];  Service: Oral Surgery;  Laterality: Bilateral;   TUBAL LIGATION      Family History: Family History  Problem Relation Age of Onset   Heart disease Mother    Asthma Sister    Diabetes Sister    COPD Sister    Diabetes Sister    Diabetes Sister    Breast cancer Neg Hx    Colon cancer Neg Hx    Esophageal cancer Neg Hx    Pancreatic cancer Neg Hx    Liver disease Neg Hx    Stomach cancer Neg Hx     Social History: Social History   Socioeconomic History   Marital status: Single     Spouse name: Not on file   Number of children: 3   Years of education: 8-9   Highest education level: Not on file  Occupational History   Occupation: applying for disability  Tobacco Use   Smoking status: Every Day    Packs/day: 2.00    Years: 29.00    Additional pack years: 0.00    Total pack years: 58.00    Types: Cigarettes   Smokeless tobacco: Never  Vaping Use   Vaping Use: Never used  Substance and Sexual Activity   Alcohol use: No    Alcohol/week: 0.0 standard drinks of alcohol    Comment: Stopped drinking 2013   Drug use: No   Sexual activity: Not Currently    Birth control/protection: None  Other Topics Concern   Not on file  Social History Narrative   Patient lives alone in an apartment on the first floor.  3 children.  Applying for disability.  Education: 8th or 9th grade.   Currently not working   Trying to go back to school online for [employer]   Six Grandchildren   Social Determinants of Health   Financial Resource Strain: Not on file  Food Insecurity: No Food Insecurity (09/04/2019)   Hunger Vital Sign    Worried About Running Out of Food in the Last Year: Never true    Ran Out of Food in the Last Year: Never true  Transportation Needs: Unmet Transportation Needs (09/04/2019)   PRAPARE - Transportation    Lack of Transportation (Medical): No    Lack of Transportation (Non-Medical): Yes  Physical Activity: Inactive (06/08/2017)   Exercise Vital Sign    Days of Exercise per Week: 0 days    Minutes of Exercise per Session: 0 min  Stress: No Stress Concern Present (06/08/2017)   [employer] of Occupational Health - Occupational Stress Questionnaire    Feeling of Stress : Only a little  Social Connections: Socially Isolated (06/08/2017)   Social Connection and Isolation Panel [NHANES]    Frequency of Communication with Friends and Family: Twice a week    Frequency of Social Gatherings with Friends  and Family: Never    Attends Religious Services: Never     Active Member of Clubs or Organizations: No    Attends Banker Meetings: Never    Marital Status: Divorced    Allergies:  Allergies  Allergen Reactions   Amoxicillin     Pain Did it involve swelling of the face/tongue/throat, SOB, or low BP? No Did it involve sudden or severe rash/hives, skin peeling, or any reaction on the inside of your mouth or nose? No Did you need to seek medical attention at a hospital or doctor's office? Yes When did it last happen?      5 years ago If all above answers are "NO", may proceed with cephalosporin use.    Darvocet [Propoxyphene N-Acetaminophen] Nausea And Vomiting    Makes her jittery   Hydrocodone-Acetaminophen Nausea And Vomiting    Upset stomach   Percocet [Oxycodone-Acetaminophen]     Makes her jittery   Valium [Diazepam] Other (See Comments)    Makes pt. Feel "out of wack"    Objective:    Vital Signs:   Temp:  [97.5 F (36.4 C)-97.8 F (36.6 C)] 97.5 F (36.4 C) (05/17 0831) Pulse Rate:  [84-97] 97 (05/17 0831) Resp:  [16-29] 18 (05/17 0831) BP: (123-133)/(91-99) 130/99 (05/17 0831) SpO2:  [97 %-100 %] 97 % (05/17 0831) Weight:  [73.3 kg-75.8 kg] 73.3 kg (05/17 0612) Last BM Date : 08/24/22  Weight change: Filed Weights   08/25/22 1651 08/26/22 0612  Weight: 75.8 kg 73.3 kg    Intake/Output:   Intake/Output Summary (Last 24 hours) at 08/26/2022 1105 Last data filed at 08/26/2022 0835 Gross per 24 hour  Intake 120 ml  Output --  Net 120 ml    Physical Exam   General:  weak appearing.  No respiratory difficulty HEENT: normal Neck: supple. JVD to jaw. Carotids 2+ bilat; no bruits. No lymphadenopathy or thyromegaly appreciated. Cor: PMI nondisplaced. Regular rate & rhythm. No rubs, gallops or murmurs. Lungs: clear Abdomen: soft, nontender, nondistended. No hepatosplenomegaly. No bruits or masses. Good bowel sounds. Extremities: no cyanosis, clubbing, rash, +1 BLE edema  Neuro: alert & oriented x 3,  cranial nerves grossly intact. moves all 4 extremities w/o difficulty. Affect pleasant.   Telemetry   NSR 90s with occasional PVCs (Personally reviewed)    EKG    Ordered. Last EKG from 2021 with SB.   Labs   Basic Metabolic Panel: Recent Labs  Lab 08/25/22 1651 08/26/22 0043  NA 139 135  K 4.1 4.2  CL 106 106  CO2 23 19*  GLUCOSE 179* 138*  BUN 14 13  CREATININE 0.95 0.80  CALCIUM 8.9 8.8*    Liver Function Tests: Recent Labs  Lab 08/25/22 1651 08/26/22 0043  AST 98* 79*  ALT 174* 158*  ALKPHOS 95 89  BILITOT 1.0 0.6  PROT 6.6 6.3*  ALBUMIN 3.2* 3.0*   Recent Labs  Lab 08/25/22 1651  LIPASE 34   No results for input(s): "AMMONIA" in the last 168 hours.  CBC: Recent Labs  Lab 08/25/22 1651 08/26/22 0043  WBC 3.8* 4.7  NEUTROABS 2.8  --   HGB 11.3* 11.4*  HCT 37.8 36.2  MCV 89.4 85.8  PLT 198 195    Cardiac Enzymes: No results for input(s): "CKTOTAL", "CKMB", "CKMBINDEX", "TROPONINI" in the last 168 hours.  BNP: BNP (last 3 results) Recent Labs    08/25/22 2230  BNP 2,502.5*    ProBNP (last 3 results) No results for input(s): "PROBNP"  in the last 8760 hours.   CBG: Recent Labs  Lab 08/26/22 0220  GLUCAP 143*    Coagulation Studies: No results for input(s): "LABPROT", "INR" in the last 72 hours.   Imaging   ECHOCARDIOGRAM COMPLETE  Result Date: 08/26/2022    ECHOCARDIOGRAM REPORT   Patient Name:   KAMAR TOLSON Date of Exam: 08/26/2022 Medical Rec #:  [id number]       Height:       65.0 in Accession #:    [id number]      Weight:       161.6 lb Date of Birth:  09/29/1965       BSA:          1.807 m Patient Age:    56 years        BP:           130/99 mmHg Patient Gender: F               HR:           98 bpm. Exam Location:  Inpatient Procedure: 2D Echo, Cardiac Doppler, Color Doppler and Intracardiac            Opacification Agent Indications:    Dyspnea  History:        Patient has no prior history of Echocardiogram examinations.                  Risk Factors:Hypertension and Diabetes.  Sonographer:    Darlys Gales Referring Phys: [id number] TIMOTHY S OPYD IMPRESSIONS  1. Left ventricular ejection fraction, by estimation, is <20%. The left ventricle has severely decreased function. The left ventricle demonstrates global hypokinesis. The left ventricular internal cavity size was moderately dilated. Left ventricular diastolic parameters are consistent with Grade III diastolic dysfunction (restrictive).  2. Right ventricular systolic function is moderately reduced. The right ventricular size is normal. There is moderately elevated pulmonary artery systolic pressure. The estimated right ventricular systolic pressure is 58.0 mmHg.  3. Left atrial size was moderately dilated.  4. Right atrial size was mildly dilated.  5. The mitral valve is normal in structure. Mild mitral valve regurgitation. No evidence of mitral stenosis.  6. Tricuspid valve regurgitation is moderate.  7. The aortic valve is tricuspid. There is mild calcification of the aortic valve. Aortic valve regurgitation is not visualized. No aortic stenosis is present.  8. The inferior vena cava is dilated in size with <50% respiratory variability, suggesting right atrial pressure of 15 mmHg. FINDINGS  Left Ventricle: Left ventricular ejection fraction, by estimation, is <20%. The left ventricle has severely decreased function. The left ventricle demonstrates global hypokinesis. Definity contrast agent was given IV to delineate the left ventricular endocardial borders. The left ventricular internal cavity size was moderately dilated. There is no left ventricular hypertrophy. Left ventricular diastolic parameters are consistent with Grade III diastolic dysfunction (restrictive). Right Ventricle: The right ventricular size is normal. No increase in right ventricular wall thickness. Right ventricular systolic function is moderately reduced. There is moderately elevated pulmonary artery systolic  pressure. The tricuspid regurgitant velocity is 3.28 m/s, and with an assumed right atrial pressure of 15 mmHg, the estimated right ventricular systolic pressure is 58.0 mmHg. Left Atrium: Left atrial size was moderately dilated. Right Atrium: Right atrial size was mildly dilated. Pericardium: There is no evidence of pericardial effusion. Mitral Valve: The mitral valve is normal in structure. Mild mitral valve regurgitation. No evidence of mitral valve stenosis. Tricuspid Valve: The tricuspid valve is  normal in structure. Tricuspid valve regurgitation is moderate . No evidence of tricuspid stenosis. Aortic Valve: The aortic valve is tricuspid. There is mild calcification of the aortic valve. Aortic valve regurgitation is not visualized. No aortic stenosis is present. Aortic valve mean gradient measures 2.0 mmHg. Aortic valve peak gradient measures 3.4 mmHg. Aortic valve area, by VTI measures 1.66 cm. Pulmonic Valve: The pulmonic valve was normal in structure. Pulmonic valve regurgitation is mild. No evidence of pulmonic stenosis. Aorta: The aortic root is normal in size and structure. Venous: The inferior vena cava is dilated in size with less than 50% respiratory variability, suggesting right atrial pressure of 15 mmHg. IAS/Shunts: No atrial level shunt detected by color flow Doppler.  LEFT VENTRICLE PLAX 2D LVIDd:         5.75 cm      Diastology LVIDs:         5.50 cm      LV e' medial:    2.72 cm/s LV PW:         0.85 cm      LV E/e' medial:  30.8 LV IVS:        0.50 cm      LV e' lateral:   9.79 cm/s LVOT diam:     1.80 cm      LV E/e' lateral: 8.6 LV SV:         20 LV SV Index:   11 LVOT Area:     2.54 cm  LV Volumes (MOD) LV vol d, MOD A4C: 124.0 ml LV vol s, MOD A4C: 98.0 ml LV SV MOD A4C:     124.0 ml RIGHT VENTRICLE RV S prime:     8.27 cm/s TAPSE (M-mode): 0.6 cm LEFT ATRIUM           Index        RIGHT ATRIUM           Index LA Vol (A4C): 83.6 ml 46.24 ml/m  RA Area:     16.30 cm                                     RA Volume:   39.90 ml  22.08 ml/m  AORTIC VALVE AV Area (Vmax):    2.11 cm AV Area (Vmean):   1.75 cm AV Area (VTI):     1.66 cm AV Vmax:           91.60 cm/s AV Vmean:          70.600 cm/s AV VTI:            0.119 m AV Peak Grad:      3.4 mmHg AV Mean Grad:      2.0 mmHg LVOT Vmax:         75.80 cm/s LVOT Vmean:        48.600 cm/s LVOT VTI:          0.078 m LVOT/AV VTI ratio: 0.65  AORTA Ao Root diam: 2.10 cm Ao Asc diam:  2.50 cm MITRAL VALVE               TRICUSPID VALVE MV Area (PHT): 4.77 cm    TR Peak grad:   43.0 mmHg MV Decel Time: 159 msec    TR Vmax:        328.00 cm/s MV E velocity: 83.80 cm/s  SHUNTS                            Systemic VTI:  0.08 m                            Systemic Diam: 1.80 cm Arvilla Meres MD Electronically signed by Arvilla Meres MD Signature Date/Time: 08/26/2022/10:15:44 AM    Final    US Abdomen Limited RUQ (LIVER/GB)  Result Date: 08/26/2022 CLINICAL DATA:  Elevated liver function tests EXAM: ULTRASOUND ABDOMEN LIMITED RIGHT UPPER QUADRANT COMPARISON:  None Available. FINDINGS: Gallbladder: Layering sludge is seen within the gallbladder. The gallbladder, however, is not distended, there is no gallbladder wall thickening, and no pericholecystic fluid is identified. The sonographic Eulah Pont sign is reportedly negative. Common bile duct: Diameter: 4 mm in proximal diameter Liver: No focal lesion identified. Within normal limits in parenchymal echogenicity. Portal vein is patent on color Doppler imaging with normal direction of blood flow towards the liver. Other: None. IMPRESSION: 1. Gallbladder sludge without sonographic evidence of acute cholecystitis. Electronically Signed   By: Helyn Numbers M.D.   On: 08/26/2022 02:02   DG Chest 2 View  Result Date: 08/25/2022 CLINICAL DATA:  Cough EXAM: CHEST - 2 VIEW COMPARISON:  12/10/2017 FINDINGS: Interval development of mild cardiomegaly. Mild right basilar atelectasis. No  pneumothorax or pleural effusion. Pulmonary vascularity is normal. No acute bone abnormality. IMPRESSION: 1. Mild cardiomegaly. 2. Mild right basilar atelectasis. Electronically Signed   By: Helyn Numbers M.D.   On: 08/25/2022 21:33   CT Head Wo Contrast  Result Date: 08/25/2022 CLINICAL DATA:  Transient slurred speech. EXAM: CT HEAD WITHOUT CONTRAST TECHNIQUE: Contiguous axial images were obtained from the base of the skull through the vertex without intravenous contrast. RADIATION DOSE REDUCTION: This exam was performed according to the departmental dose-optimization program which includes automated exposure control, adjustment of the mA and/or kV according to patient size and/or use of iterative reconstruction technique. COMPARISON:  MRI examination dated May 05, 2022 FINDINGS: Brain: No evidence of acute infarction, hemorrhage, hydrocephalus, extra-axial collection or mass lesion/mass effect. Vascular: No hyperdense vessel or unexpected calcification. Skull: Normal. Negative for fracture or focal lesion. Sinuses/Orbits: No acute finding. Other: None. IMPRESSION: No acute intracranial pathology. Electronically Signed   By: Larose Hires D.O.   On: 08/25/2022 18:09     Medications:   Current Medications:  amitriptyline  75 mg Oral QHS   enoxaparin (LOVENOX) injection  40 mg Subcutaneous Q24H   furosemide  40 mg Intravenous BID   gabapentin  300 mg Oral BID   insulin aspart  0-5 Units Subcutaneous QHS   insulin aspart  0-9 Units Subcutaneous TID WC   losartan  25 mg Oral Daily   pantoprazole  40 mg Oral Daily   rosuvastatin  5 mg Oral Daily   sodium chloride flush  3 mL Intravenous Q12H    Infusions:   Patient Profile  Ms. Brookes is a 57 y.o. female with DM, HTN, HLP, bipolar disorder, chronic pain, hx of ETOH abuse (stopped in 13'), depression, and anxiety. Presented to the ED 5/16 with SOB, nausea, loss of appetite and BLE edema. Echo revealed EF <20%, AHF consulted.  Assessment/Plan   Acute combined systolic and diastolic heart failure - Echo 1/61: EF <20%, LV with GHL, GIIIDD, RV mod reduced, mod elevated PASP ~58, LA mod dilated, RA mildly dilated, mild MR, mod TR,  -  Echo 8/11: EF 50-55%, RV normal - NYHA III-IV, previous hx heavy alcohol use, denies sick contacts. Heavy smoker.  - Remains volume overloaded on exam. Appears to be diuresing well, suspect I&O inaccurate. Weight down 6lbs. - Increase lasix to 60 mg IV BID - Start farxiga 10 mg daily - Add MRA tomorrow - hold off on BB for now with acute exacerbation - Place PICC, trend co-ox and CVP - will need L/RHC once better diuresed (will schedule for Monday), if not revealing will need cMRI - strict I&O, daily weights - check lipid panel, anemia panel, TSH. HIV (-) HTN - BP slightly elevated - Losartan started today, plan to transition to entresto tomorrow if tolerated well Elevated LFTs  - suspect hepatorenal syndrome, improving - follow CMET DMII - Hgb A1c 6.5 2/24 - per primary - repeat A1c ordered  Depression/ Anxiety - Per primary 6. Hyperlipidemia - Last LDL 9/23 83. Goal <70 - repeat lipid panel - Continue statin 7. Smoker - currently heavy smoker, 2PPD. Has smoked since she was 11. - cessation heavily encouraged  Length of Stay: 0  Alen Bleacher, NP  08/26/2022, 11:05 AM  Advanced Heart Failure Team Pager [phone] (M-F; 7a - 5p)  Please contact [facility] for night-coverage after hours (4p -7a ) and weekends on [email or web address]   Patient seen with NP, agree with the above note.   She is a heavy smoker with diabetes and a remote history of ETOH abuse. She has had progressive exertional dyspnea for several months, worse recently.  She has rare atypical chest pain.   ECG is abnormal with IVCD, 122 msec.   Echo showed EF < 20%, moderate RV dysfunction.    General: NAD Neck: JVP 14 cm, no thyromegaly or thyroid nodule.  Lungs: Clear to auscultation bilaterally with normal respiratory  effort. CV: Lateral PMI.  Heart regular S1/S2, +S3, no murmur.  1+ edema to knees.  No carotid bruit.  Normal pedal pulses.  Abdomen: Soft, nontender, no hepatosplenomegaly, no distention.  Skin: Intact without lesions or rashes.  Neurologic: Alert and oriented x 3.  Psych: Normal affect. Extremities: No clubbing or cyanosis.  HEENT: Normal.   Cardiomyopathy of uncertain etiology with volume overload and NYHA class IV symptoms at home.  She does have CAD risk factors with family history, heavy smoking, diabetes.  Not ACS-like presentation.  - Lasix 60 mg IV bid and follow response.  - Farxiga 10 mg daily and add spironolactone 12.5 daily.  - Agree with PICC to follow CVP and co-ox over weekend.  - She will need RHC/LHC. If unrevealing, cardiac MRI.  Marca Ancona 08/26/2022 4:32 PM

## 2022-08-26 NOTE — H&P (Signed)
History and Physical    FINA FREAD AOZ:[id number] DOB: May 02, 1965 DOA: 08/25/2022  PCP: Claiborne Rigg, NP   Patient coming from: Home   Chief Complaint: Leg swelling, loss of appetite, nausea, DOE   HPI: Kendra Griffith is a 57 y.o. female with medical history significant for depression, anxiety, chronic pain, diet-controlled diabetes mellitus, hypertension, and hyperlipidemia who presents to the emergency department with multiple complaints including loss of appetite, nausea, exertional dyspnea, and leg swelling.  Patient reports approximately 1 month of poor appetite and nausea. She has also noted new onset of bilateral lower extremity swelling over the past week or so.  She reports worsening exertional dyspnea over the past week and also notes that she has been experiencing difficulty sleeping.  She denies chest pain, fever, or abdominal pain.  ED Course: Upon arrival to the ED, patient is found to be afebrile and saturating well on room air with normal respiratory rate, normal heart rate, and stable blood pressure.  Head CT is negative for acute intracranial abnormality.  Chest x-ray notable for mild cardiomegaly.  Blood work is notable for AST 98, ALT 174, WBC 3800, and BNP 2503.  Cardiology (Dr. Hector Brunswick) was consulted by the ED physician and recommended medical admission for diuresis and echocardiogram.  Patient was given 40 mg IV Lasix in the ED.  Review of Systems:  All other systems reviewed and apart from HPI, are negative.  Past Medical History:  Diagnosis Date   ABSCESS, TOOTH 08/20/2009   Qualifier: Diagnosis of  By: Huntley Dec, Scott     Anemia    Anxiety    Bipolar disorder (HCC)    Blood transfusion without reported diagnosis    childbirth   Chronic pain    feet and back   Clotting disorder (HCC)    childbirth   Dental caries    lesion soft palate   Depression    Diabetes mellitus without complication (HCC)    prediabetes   GERD (gastroesophageal reflux  disease)    Hypertension    Injury    right side from jumping off bunkbed   Pre-diabetes    Sickle cell trait (HCC)    Wears glasses     Past Surgical History:  Procedure Laterality Date   BARTHOLIN CYST MARSUPIALIZATION N/A 07/16/2019   Procedure: EXCISION OF BARTHOLIN CYST;  Surgeon: Hermina Staggers, MD;  Location: [facility];  Service: Gynecology;  Laterality: N/A;   CESAREAN SECTION     three   COLONOSCOPY     cyst removal 1997     FACIAL RECONSTRUCTION SURGERY     FOOT SURGERY Bilateral    LESION REMOVAL Left 10/15/2018   Procedure: Lesion Removal left soft palate;  Surgeon: Ocie Doyne, DDS;  Location: [facility] OR;  Service: Oral Surgery;  Laterality: Left;   TOOTH EXTRACTION Bilateral 10/15/2018   Procedure: DENTAL EXTRACTIONS OF TEETH NUMBER TWO, FOUR, FIVE, SIX, SEVEN, EIGHT, NINE, TEN, ELEVEN, TWELVE, THIRTEEN, FOURTEEN, SEVENTEEN, TWENTY-ONE, TWENTY-TWO, TWENTY-THREE, TWENTY-FOUR, TWENTY-FIVE, TWENTY-SIX, TWENTY-SEVEN, TWENTY-EIGHT WITH ALVEOLOPLASTY;  Surgeon: Ocie Doyne, DDS;  Location: [facility] OR;  Service: Oral Surgery;  Laterality: Bilateral;   TUBAL LIGATION      Social History:   reports that she has been smoking cigarettes. She has a 58.00 pack-year smoking history. She has never used smokeless tobacco. She reports that she does not drink alcohol and does not use drugs.  Allergies  Allergen Reactions   Amoxicillin     Pain Did it involve swelling  of the face/tongue/throat, SOB, or low BP? No Did it involve sudden or severe rash/hives, skin peeling, or any reaction on the inside of your mouth or nose? No Did you need to seek medical attention at a hospital or doctor's office? Yes When did it last happen?      5 years ago If all above answers are "NO", may proceed with cephalosporin use.    Darvocet [Propoxyphene N-Acetaminophen] Nausea And Vomiting    Makes her jittery   Hydrocodone-Acetaminophen Nausea And Vomiting    Upset stomach   Percocet  [Oxycodone-Acetaminophen]     Makes her jittery   Valium [Diazepam] Other (See Comments)    Makes pt. Feel "out of wack"    Family History  Problem Relation Age of Onset   Heart disease Mother    Asthma Sister    Diabetes Sister    COPD Sister    Diabetes Sister    Diabetes Sister    Breast cancer Neg Hx    Colon cancer Neg Hx    Esophageal cancer Neg Hx    Pancreatic cancer Neg Hx    Liver disease Neg Hx    Stomach cancer Neg Hx      Prior to Admission medications   Medication Sig Start Date End Date Taking? Authorizing Provider  acetaminophen-codeine (TYLENOL #3) 300-30 MG tablet Take 1-2 tablets by mouth every 4 (four) hours as needed for moderate pain. 07/08/22   Claiborne Rigg, NP  albuterol (VENTOLIN HFA) 108 (90 Base) MCG/ACT inhaler Inhale 2 puffs into the lungs every 6 (six) hours as needed for wheezing or shortness of breath. 07/08/22   Claiborne Rigg, NP  amitriptyline (ELAVIL) 75 MG tablet Take 1 tablet (75 mg total) by mouth at bedtime for pain and depression 08/17/22   Claiborne Rigg, NP  Biotin 5 MG CAPS Take 5 mg by mouth 3 (three) times daily.    [provider]  chlorhexidine (HIBICLENS) 4 % external liquid Apply topically daily as needed. Use when bathing or showering 10/07/20   Claiborne Rigg, NP  cholecalciferol (VITAMIN D3) 25 MCG (1000 UT) tablet Take 1,000 Units by mouth daily.    [provider]  famotidine (PEPCID) 20 MG tablet Take 1 tablet (20 mg total) by mouth 2 (two) times daily for heartburn 08/17/22   Claiborne Rigg, NP  gabapentin (NEURONTIN) 300 MG capsule Take 1 capsule (300 mg total) by mouth 2 (two) times daily. 03/15/22   Claiborne Rigg, NP  gentamicin (GARAMYCIN) 0.3 % ophthalmic solution Place 1 drop into the left eye 4 (four) times daily. 07/07/22   Anders Simmonds, PA-C  GLUTAMINE PO Take 500 mg by mouth.    [provider]  hydroquinone 4 % cream Apply topically 2 (two) times daily. 05/22/20   [provider]  hydrOXYzine (ATARAX) 25 MG tablet Take 1 tablet (25 mg total) by mouth 3 (three) times daily as needed. 07/08/22   Claiborne Rigg, NP  ibuprofen (ADVIL) 800 MG tablet Take 1 tablet (800 mg total) by mouth every 8 (eight) hours as needed. 01/25/21   Claiborne Rigg, NP  losartan (COZAAR) 25 MG tablet Take 1 tablet (25 mg total) by mouth daily for blood pressure 08/17/22   Claiborne Rigg, NP  Misc. Devices MISC Please provide patient with incontinence pads or disposable underwear or choice. N39.3 05/18/22   Claiborne Rigg, NP  nicotine (NICODERM CQ - DOSED IN MG/24 HOURS) 21 mg/24hr  patch Place 1 patch (21 mg total) onto the skin daily. 08/17/22 09/28/22  Claiborne Rigg, NP  nicotine polacrilex (NICOTINE MINI) 4 MG lozenge Dissolve 1 lozenge (4 mg total) by mouth as needed for smoking cessation. 08/17/22   Claiborne Rigg, NP  NONFORMULARY OR COMPOUNDED ITEM Peripheral Neuropathy Cream: Bupivacaine 1%, Doxepin 3%, Gabapentin 6%, Pentoxifylline 3%, Topiramate 1% Order fax[facility] to [facility] 10/02/19   Freddie Breech, DPM  omeprazole (PRILOSEC) 20 MG capsule Take 1 capsule (20 mg total) by mouth daily for heartburn 08/17/22   Claiborne Rigg, NP  rosuvastatin (CRESTOR) 5 MG tablet Take 1 tablet (5 mg total) by mouth daily for cholesterol 08/17/22   Claiborne Rigg, NP  vitamin B-12 (CYANOCOBALAMIN) 500 MCG tablet Take 500 mcg by mouth daily.    [provider]  vitamin C (ASCORBIC ACID) 500 MG tablet Take 500 mg by mouth daily.    [provider]    Physical Exam: Vitals:   08/25/22 1650 08/25/22 1651 08/25/22 2005  BP: (!) 133/98  (!) 132/97  Pulse: 95  96  Resp: 18  16  Temp: 97.8 F (36.6 C)  (!) 97.5 F (36.4 C)  TempSrc:   Oral  SpO2: 100%  99%  Weight:  75.8 kg   Height:  5\' 5"  (1.651 m)      Constitutional: NAD, no pallor or diaphoresis   Eyes: PERTLA, lids and conjunctivae normal ENMT: Mucous membranes are moist. Posterior pharynx clear  of any exudate or lesions.   Neck: supple, no masses  Respiratory: no wheezing, no crackles. No accessory muscle use.  Cardiovascular: S1 & S2 heard, regular rate and rhythm. Pretibial pitting edema b/l. JVD present. Abdomen: No distension, no tenderness, soft. Bowel sounds active.  Musculoskeletal: no clubbing / cyanosis. No joint deformity upper and lower extremities.   Skin: no significant rashes, lesions, ulcers. Warm, dry, well-perfused. Neurologic: CN 2-12 grossly intact. Moving all extremities. Alert and oriented.  Psychiatric: Calm. Cooperative.    Labs and Imaging on Admission: I have personally reviewed following labs and imaging studies  CBC: Recent Labs  Lab 08/25/22 1651 08/26/22 0043  WBC 3.8* 4.7  NEUTROABS 2.8  --   HGB 11.3* 11.4*  HCT 37.8 36.2  MCV 89.4 85.8  PLT 198 195   Basic Metabolic Panel: Recent Labs  Lab 08/25/22 1651  NA 139  K 4.1  CL 106  CO2 23  GLUCOSE 179*  BUN 14  CREATININE 0.95  CALCIUM 8.9   GFR: Estimated Creatinine Clearance: 67.3 mL/min (by C-G formula based on SCr of 0.95 mg/dL). Liver Function Tests: Recent Labs  Lab 08/25/22 1651  AST 98*  ALT 174*  ALKPHOS 95  BILITOT 1.0  PROT 6.6  ALBUMIN 3.2*   Recent Labs  Lab 08/25/22 1651  LIPASE 34   No results for input(s): "AMMONIA" in the last 168 hours. Coagulation Profile: No results for input(s): "INR", "PROTIME" in the last 168 hours. Cardiac Enzymes: No results for input(s): "CKTOTAL", "CKMB", "CKMBINDEX", "TROPONINI" in the last 168 hours. BNP (last 3 results) No results for input(s): "PROBNP" in the last 8760 hours. HbA1C: No results for input(s): "HGBA1C" in the last 72 hours. CBG: No results for input(s): "GLUCAP" in the last 168 hours. Lipid Profile: No results for input(s): "CHOL", "HDL", "LDLCALC", "TRIG", "CHOLHDL", "LDLDIRECT" in the last 72 hours. Thyroid Function Tests: No results for input(s): "TSH", "T4TOTAL", "FREET4", "T3FREE", "THYROIDAB"  in the last 72 hours. Anemia Panel: No results  for input(s): "VITAMINB12", "FOLATE", "FERRITIN", "TIBC", "IRON", "RETICCTPCT" in the last 72 hours. Urine analysis:    Component Value Date/Time   COLORURINE AMBER (A) 08/25/2022 1653   APPEARANCEUR CLOUDY (A) 08/25/2022 1653   APPEARANCEUR Clear 05/18/2022 0924   LABSPEC 1.025 08/25/2022 1653   PHURINE 5.0 08/25/2022 1653   GLUCOSEU NEGATIVE 08/25/2022 1653   HGBUR NEGATIVE 08/25/2022 1653   HGBUR large 12/08/2009 1125   BILIRUBINUR NEGATIVE 08/25/2022 1653   BILIRUBINUR Negative 05/18/2022 0924   KETONESUR 5 (A) 08/25/2022 1653   PROTEINUR >=300 (A) 08/25/2022 1653   UROBILINOGEN 0.2 10/06/2011 2108   NITRITE NEGATIVE 08/25/2022 1653   LEUKOCYTESUR NEGATIVE 08/25/2022 1653   Sepsis Labs: @LABRCNTIP (procalcitonin:4,lacticidven:4) )No results found for this or any previous visit (from the past 240 hour(s)).   Radiological Exams on Admission: DG Chest 2 View  Result Date: 08/25/2022 CLINICAL DATA:  Cough EXAM: CHEST - 2 VIEW COMPARISON:  12/10/2017 FINDINGS: Interval development of mild cardiomegaly. Mild right basilar atelectasis. No pneumothorax or pleural effusion. Pulmonary vascularity is normal. No acute bone abnormality. IMPRESSION: 1. Mild cardiomegaly. 2. Mild right basilar atelectasis. Electronically Signed   By: Helyn Numbers M.D.   On: 08/25/2022 21:33   CT Head Wo Contrast  Result Date: 08/25/2022 CLINICAL DATA:  Transient slurred speech. EXAM: CT HEAD WITHOUT CONTRAST TECHNIQUE: Contiguous axial images were obtained from the base of the skull through the vertex without intravenous contrast. RADIATION DOSE REDUCTION: This exam was performed according to the departmental dose-optimization program which includes automated exposure control, adjustment of the mA and/or kV according to patient size and/or use of iterative reconstruction technique. COMPARISON:  MRI examination dated May 05, 2022 FINDINGS: Brain: No evidence of  acute infarction, hemorrhage, hydrocephalus, extra-axial collection or mass lesion/mass effect. Vascular: No hyperdense vessel or unexpected calcification. Skull: Normal. Negative for fracture or focal lesion. Sinuses/Orbits: No acute finding. Other: None. IMPRESSION: No acute intracranial pathology. Electronically Signed   By: Larose Hires D.O.   On: 08/25/2022 18:09     Assessment/Plan   1. New CHF  - Presents with recent b/l leg swelling and mild DOE  - Cardiomegaly noted on CXR and BNP is 2503  - Cardiology recommended inpatient workup and diuresis  - Continue diuresis with 40 mg IV Lasix q12h, monitor weight and I/Os, check EKG and echocardiogram    2. Hypertension  - Continue losartan    3. Chronic pain  - Continue amitriptyline and gabapentin    4. Depression, anxiety  - Continue amitriptyline and as-needed hydroxyzine    5. Elevated transaminases  - AST is 98 and ALT 174  - Exam is benign, lipase is normal, and she has not had EtOH in several years  - Check RUQ [place] and viral hepatitis panel, repeat CMP in am   6. Diet-controlled DM  - Monitor, use low-intensity SSI if needed    DVT prophylaxis: Lovenox  Code Status: Full  Level of Care: Level of care: Telemetry Cardiac Family Communication: none present  Disposition Plan:  Patient is from: home  Anticipated d/c is to: Home  Anticipated d/c date is: 5/17 or 08/27/22  Patient currently: Pending echocardiogram, cardiology consultation  Consults called: Cardiology consulted by ED physician  Admission status: Observation     Briscoe Deutscher, MD [facility]  08/26/2022, 1:00 AM

## 2022-08-26 NOTE — Progress Notes (Signed)
Peripherally Inserted Central Catheter Placement  The IV Nurse has discussed with the patient and/or persons authorized to consent for the patient, the purpose of this procedure and the potential benefits and risks involved with this procedure.  The benefits include less needle sticks, lab draws from the catheter, and the patient may be discharged home with the catheter. Risks include, but not limited to, infection, bleeding, blood clot (thrombus formation), and puncture of an artery; nerve damage and irregular heartbeat and possibility to perform a PICC exchange if needed/ordered by physician.  Alternatives to this procedure were also discussed.  Bard Power PICC patient education guide, fact sheet on infection prevention and patient information card has been provided to patient /or left at bedside.    PICC Placement Documentation  PICC Double Lumen 08/26/22 Right Basilic 39 cm 0 cm (Active)  Indication for Insertion or Continuance of Line Chronic illness with exacerbations (CF, Sickle Cell, etc.) 08/26/22 2115  Exposed Catheter (cm) 0 cm 08/26/22 2115  Site Assessment Clean, Dry, Intact 08/26/22 2115  Lumen #1 Status Flushed;Saline locked;Blood return noted 08/26/22 2115  Lumen #2 Status Flushed;Saline locked;Blood return noted 08/26/22 2115  Dressing Type Transparent;Securing device 08/26/22 2115  Dressing Status Antimicrobial disc in place;Clean, Dry, Intact 08/26/22 2115  Line Care Connections checked and tightened 08/26/22 2115  Dressing Intervention New dressing 08/26/22 2115  Dressing Change Due 09/02/22 08/26/22 2115       Curt Jews 08/26/2022, 9:33 PM

## 2022-08-26 NOTE — Progress Notes (Signed)
Heart Failure Navigator Progress Note  Assessed for Heart & Vascular TOC clinic readiness.  Patient does not meet criteria due to Advanced Heart Failure Team consulted. .   Navigator will sign off at this time.   Valree Feild, BSN, RN Heart Failure Nurse Navigator Secure Chat Only   

## 2022-08-26 NOTE — ED Notes (Addendum)
Patient refusing to ambulate to bathroom. Patient reports she will urinate in brief & requesting for cleaning supplies. Provided patient with supplies as requested and encouraged patient to ambulate to bathroom.

## 2022-08-26 NOTE — Progress Notes (Signed)
Echocardiogram 2D Echocardiogram has been performed.  Kendra Griffith 08/26/2022, 9:27 AM

## 2022-08-26 NOTE — ED Notes (Signed)
ED TO INPATIENT HANDOFF REPORT  ED Nurse Name and Phone #:  Gillis Ends [phone]  S Name/Age/Gender Kendra Griffith 57 y.o. female Room/Bed: 009C/009C  Code Status   Code Status: Full Code  Home/SNF/Other Home Patient oriented to: self, place, time, and situation Is this baseline? Yes   Triage Complete: Triage complete  Chief Complaint New onset of congestive heart failure (HCC) [I50.9]  Triage Note Pt arrived via [facility] from home for c/o nauseax30d and worsening depression. Pt told EMS she hasn't been able to sleep since yesterday afternoon at 1500. Pt told EMS she has had decreased appetitex30d. Pt denies SI   Allergies Allergies  Allergen Reactions   Amoxicillin     Pain Did it involve swelling of the face/tongue/throat, SOB, or low BP? No Did it involve sudden or severe rash/hives, skin peeling, or any reaction on the inside of your mouth or nose? No Did you need to seek medical attention at a hospital or doctor's office? Yes When did it last happen?      5 years ago If all above answers are "NO", may proceed with cephalosporin use.    Darvocet [Propoxyphene N-Acetaminophen] Nausea And Vomiting    Makes her jittery   Hydrocodone-Acetaminophen Nausea And Vomiting    Upset stomach   Percocet [Oxycodone-Acetaminophen]     Makes her jittery   Valium [Diazepam] Other (See Comments)    Makes pt. Feel "out of wack"    Level of Care/Admitting Diagnosis ED Disposition     ED Disposition  Admit   Condition  --   Comment  Hospital Area: [facility] [[id number]]  Level of Care: Telemetry Cardiac [[id number]]  May place patient in observation at [facility] or [facility] if equivalent level of care is available:: Yes  Covid Evaluation: Asymptomatic - no recent exposure (last 10 days) testing not required  Diagnosis: New onset of congestive heart failure [facility]) [[id number]]  Admitting Physician: Briscoe Deutscher [[id number]]  Attending Physician: Briscoe Deutscher [[id number]]           B Medical/Surgery History Past Medical History:  Diagnosis Date   ABSCESS, TOOTH 08/20/2009   Qualifier: Diagnosis of  By: Huntley Dec, Scott     Anemia    Anxiety    Bipolar disorder (HCC)    Blood transfusion without reported diagnosis    childbirth   Chronic pain    feet and back   Clotting disorder (HCC)    childbirth   Dental caries    lesion soft palate   Depression    Diabetes mellitus without complication (HCC)    prediabetes   GERD (gastroesophageal reflux disease)    Hypertension    Injury    right side from jumping off bunkbed   Pre-diabetes    Sickle cell trait (HCC)    Wears glasses    Past Surgical History:  Procedure Laterality Date   BARTHOLIN CYST MARSUPIALIZATION N/A 07/16/2019   Procedure: EXCISION OF BARTHOLIN CYST;  Surgeon: Hermina Staggers, MD;  Location: [facility];  Service: Gynecology;  Laterality: N/A;   CESAREAN SECTION     three   COLONOSCOPY     cyst removal 1997     FACIAL RECONSTRUCTION SURGERY     FOOT SURGERY Bilateral    LESION REMOVAL Left 10/15/2018   Procedure: Lesion Removal left soft palate;  Surgeon: Ocie Doyne, DDS;  Location: [facility] OR;  Service: Oral Surgery;  Laterality: Left;   TOOTH EXTRACTION Bilateral 10/15/2018  Procedure: DENTAL EXTRACTIONS OF TEETH NUMBER TWO, FOUR, FIVE, SIX, SEVEN, EIGHT, NINE, TEN, ELEVEN, TWELVE, THIRTEEN, FOURTEEN, SEVENTEEN, TWENTY-ONE, TWENTY-TWO, TWENTY-THREE, TWENTY-FOUR, TWENTY-FIVE, TWENTY-SIX, TWENTY-SEVEN, TWENTY-EIGHT WITH ALVEOLOPLASTY;  Surgeon: Ocie Doyne, DDS;  Location: [facility] OR;  Service: Oral Surgery;  Laterality: Bilateral;   TUBAL LIGATION       A IV Location/Drains/Wounds Patient Lines/Drains/Airways Status     Active Line/Drains/Airways     Name Placement date Placement time Site Days   Peripheral IV 08/25/22 20 G Right Antecubital 08/25/22  2242  Antecubital  1            Intake/Output Last 24 hours No intake or output data in the 24 hours  ending 08/26/22 0437  Labs/Imaging Results for orders placed or performed during the hospital encounter of 08/25/22 (from the past 48 hour(s))  Comprehensive metabolic panel     Status: Abnormal   Collection Time: 08/25/22  4:51 PM  Result Value Ref Range   Sodium 139 135 - 145 mmol/L   Potassium 4.1 3.5 - 5.1 mmol/L   Chloride 106 98 - 111 mmol/L   CO2 23 22 - 32 mmol/L   Glucose, Bld 179 (H) 70 - 99 mg/dL    Comment: Glucose reference range applies only to samples taken after fasting for at least 8 hours.   BUN 14 6 - 20 mg/dL   Creatinine, Ser 2.95 0.44 - 1.00 mg/dL   Calcium 8.9 8.9 - 62.1 mg/dL   Total Protein 6.6 6.5 - 8.1 g/dL   Albumin 3.2 (L) 3.5 - 5.0 g/dL   AST 98 (H) 15 - 41 U/L   ALT 174 (H) 0 - 44 U/L   Alkaline Phosphatase 95 38 - 126 U/L   Total Bilirubin 1.0 0.3 - 1.2 mg/dL   GFR, Estimated >30 >86 mL/min    Comment: (NOTE) Calculated using the CKD-EPI Creatinine Equation (2021)    Anion gap 10 5 - 15    Comment: Performed at [facility], [address] [place], [place] [address]  Lipase, blood     Status: None   Collection Time: 08/25/22  4:51 PM  Result Value Ref Range   Lipase 34 11 - 51 U/L    Comment: Performed at [facility], [address], [place], [place] [address]  CBC with Diff     Status: Abnormal   Collection Time: 08/25/22  4:51 PM  Result Value Ref Range   WBC 3.8 (L) 4.0 - 10.5 K/uL   RBC 4.23 3.87 - 5.11 MIL/uL   Hemoglobin 11.3 (L) 12.0 - 15.0 g/dL   HCT 28.4 13.2 - 44.0 %   MCV 89.4 80.0 - 100.0 fL   MCH 26.7 26.0 - 34.0 pg   MCHC 29.9 (L) 30.0 - 36.0 g/dL   RDW 10.2 (H) 72.5 - 36.6 %   Platelets 198 150 - 400 K/uL   nRBC 1.1 (H) 0.0 - 0.2 %   Neutrophils Relative % 73 %   Neutro Abs 2.8 1.7 - 7.7 K/uL   Lymphocytes Relative 19 %   Lymphs Abs 0.7 0.7 - 4.0 K/uL   Monocytes Relative 6 %   Monocytes Absolute 0.2 0.1 - 1.0 K/uL   Eosinophils Relative 0 %   Eosinophils Absolute 0.0 0.0 - 0.5 K/uL   Basophils Relative  1 %   Basophils Absolute 0.0 0.0 - 0.1 K/uL   Immature Granulocytes 1 %   Abs Immature Granulocytes 0.02 0.00 - 0.07 K/uL  Comment: Performed at [facility], [address], [place], [place] [address]  Urinalysis, Routine w reflex microscopic -Urine, Clean Catch     Status: Abnormal   Collection Time: 08/25/22  4:53 PM  Result Value Ref Range   Color, Urine AMBER (A) YELLOW    Comment: BIOCHEMICALS MAY BE AFFECTED BY COLOR   APPearance CLOUDY (A) CLEAR   Specific Gravity, Urine 1.025 1.005 - 1.030   pH 5.0 5.0 - 8.0   Glucose, UA NEGATIVE NEGATIVE mg/dL   Hgb urine dipstick NEGATIVE NEGATIVE   Bilirubin Urine NEGATIVE NEGATIVE   Ketones, ur 5 (A) NEGATIVE mg/dL   Protein, ur >=086 (A) NEGATIVE mg/dL   Nitrite NEGATIVE NEGATIVE   Leukocytes,Ua NEGATIVE NEGATIVE   RBC / HPF 0-5 0 - 5 RBC/hpf   WBC, UA 6-10 0 - 5 WBC/hpf   Bacteria, UA FEW (A) NONE SEEN   Squamous Epithelial / HPF 6-10 0 - 5 /HPF   Mucus PRESENT    Amorphous Crystal PRESENT     Comment: Performed at [facility], [address] [place], [place] [address]  Brain natriuretic peptide     Status: Abnormal   Collection Time: 08/25/22 10:30 PM  Result Value Ref Range   B Natriuretic Peptide 2,502.5 (H) 0.0 - 100.0 pg/mL    Comment: Performed at [facility], [address], [place], [place] [address]  HIV Antibody (routine testing w rflx)     Status: None   Collection Time: 08/26/22 12:43 AM  Result Value Ref Range   HIV Screen 4th Generation wRfx Non Reactive Non Reactive    Comment: Performed at [facility], [address], [place], [place] [address]  Comprehensive metabolic panel     Status: Abnormal   Collection Time: 08/26/22 12:43 AM  Result Value Ref Range   Sodium 135 135 - 145 mmol/L   Potassium 4.2 3.5 - 5.1 mmol/L   Chloride 106 98 - 111 mmol/L   CO2 19 (L) 22 - 32 mmol/L   Glucose, Bld 138 (H) 70 - 99 mg/dL    Comment: Glucose reference range applies only to samples taken  after fasting for at least 8 hours.   BUN 13 6 - 20 mg/dL   Creatinine, Ser 2.44 0.44 - 1.00 mg/dL   Calcium 8.8 (L) 8.9 - 10.3 mg/dL   Total Protein 6.3 (L) 6.5 - 8.1 g/dL   Albumin 3.0 (L) 3.5 - 5.0 g/dL   AST 79 (H) 15 - 41 U/L   ALT 158 (H) 0 - 44 U/L   Alkaline Phosphatase 89 38 - 126 U/L   Total Bilirubin 0.6 0.3 - 1.2 mg/dL   GFR, Estimated >01 >02 mL/min    Comment: (NOTE) Calculated using the CKD-EPI Creatinine Equation (2021)    Anion gap 10 5 - 15    Comment: Performed at [facility], [address] [place], [place] [address]  CBC     Status: Abnormal   Collection Time: 08/26/22 12:43 AM  Result Value Ref Range   WBC 4.7 4.0 - 10.5 K/uL   RBC 4.22 3.87 - 5.11 MIL/uL   Hemoglobin 11.4 (L) 12.0 - 15.0 g/dL   HCT 64.4 03.4 - 74.2 %   MCV 85.8 80.0 - 100.0 fL   MCH 27.0 26.0 - 34.0 pg   MCHC 31.5 30.0 - 36.0 g/dL   RDW 59.5 (H) 63.8 - 75.6 %   Platelets 195 150 - 400  K/uL   nRBC 1.3 (H) 0.0 - 0.2 %    Comment: Performed at [facility], [address] [place], [place] [address]  CBG monitoring, ED     Status: Abnormal   Collection Time: 08/26/22  2:20 AM  Result Value Ref Range   Glucose-Capillary 143 (H) 70 - 99 mg/dL    Comment: Glucose reference range applies only to samples taken after fasting for at least 8 hours.   US Abdomen Limited RUQ (LIVER/GB)  Result Date: 08/26/2022 CLINICAL DATA:  Elevated liver function tests EXAM: ULTRASOUND ABDOMEN LIMITED RIGHT UPPER QUADRANT COMPARISON:  None Available. FINDINGS: Gallbladder: Layering sludge is seen within the gallbladder. The gallbladder, however, is not distended, there is no gallbladder wall thickening, and no pericholecystic fluid is identified. The sonographic Eulah Pont sign is reportedly negative. Common bile duct: Diameter: 4 mm in proximal diameter Liver: No focal lesion identified. Within normal limits in parenchymal echogenicity. Portal vein is patent on color Doppler imaging with normal direction  of blood flow towards the liver. Other: None. IMPRESSION: 1. Gallbladder sludge without sonographic evidence of acute cholecystitis. Electronically Signed   By: Helyn Numbers M.D.   On: 08/26/2022 02:02   DG Chest 2 View  Result Date: 08/25/2022 CLINICAL DATA:  Cough EXAM: CHEST - 2 VIEW COMPARISON:  12/10/2017 FINDINGS: Interval development of mild cardiomegaly. Mild right basilar atelectasis. No pneumothorax or pleural effusion. Pulmonary vascularity is normal. No acute bone abnormality. IMPRESSION: 1. Mild cardiomegaly. 2. Mild right basilar atelectasis. Electronically Signed   By: Helyn Numbers M.D.   On: 08/25/2022 21:33   CT Head Wo Contrast  Result Date: 08/25/2022 CLINICAL DATA:  Transient slurred speech. EXAM: CT HEAD WITHOUT CONTRAST TECHNIQUE: Contiguous axial images were obtained from the base of the skull through the vertex without intravenous contrast. RADIATION DOSE REDUCTION: This exam was performed according to the departmental dose-optimization program which includes automated exposure control, adjustment of the mA and/or kV according to patient size and/or use of iterative reconstruction technique. COMPARISON:  MRI examination dated May 05, 2022 FINDINGS: Brain: No evidence of acute infarction, hemorrhage, hydrocephalus, extra-axial collection or mass lesion/mass effect. Vascular: No hyperdense vessel or unexpected calcification. Skull: Normal. Negative for fracture or focal lesion. Sinuses/Orbits: No acute finding. Other: None. IMPRESSION: No acute intracranial pathology. Electronically Signed   By: Larose Hires D.O.   On: 08/25/2022 18:09    Pending Labs Unresulted Labs (From admission, onward)     Start     Ordered   09/02/22 0500  Creatinine, serum  (enoxaparin (LOVENOX)    CrCl >/= 30 ml/min)  Weekly,   R     Comments: while on enoxaparin therapy    08/26/22 0024   08/26/22 0500  Comprehensive metabolic panel  Daily,   R      08/26/22 0024   08/26/22 0500  CBC   Daily,   R      08/26/22 0024   08/26/22 0500  Hepatitis panel, acute  Tomorrow morning,   R        08/26/22 0054   08/26/22 0109  Acetaminophen level  Add-on,   AD        08/26/22 0108            Vitals/Pain Today's Vitals   08/26/22 0158 08/26/22 0203 08/26/22 0400 08/26/22 0428  BP: (!) 130/97  (!) 123/91   Pulse: 84  91   Resp: 16  (!) 29   Temp:    97.8 F (  36.6 C)  TempSrc:    Oral  SpO2: 100%  100%   Weight:      Height:      PainSc:  0-No pain      Isolation Precautions No active isolations  Medications Medications  losartan (COZAAR) tablet 25 mg (has no administration in time range)  rosuvastatin (CRESTOR) tablet 5 mg (has no administration in time range)  amitriptyline (ELAVIL) tablet 75 mg (75 mg Oral Given 08/26/22 0122)  hydrOXYzine (ATARAX) tablet 25 mg (has no administration in time range)  pantoprazole (PROTONIX) EC tablet 40 mg (has no administration in time range)  gabapentin (NEURONTIN) capsule 300 mg (300 mg Oral Given 08/26/22 0122)  albuterol (PROVENTIL) (2.5 MG/3ML) 0.083% nebulizer solution 2.5 mg (has no administration in time range)  enoxaparin (LOVENOX) injection 40 mg (has no administration in time range)  furosemide (LASIX) injection 40 mg (has no administration in time range)  insulin aspart (novoLOG) injection 0-6 Units (has no administration in time range)  insulin aspart (novoLOG) injection 0-5 Units ( Subcutaneous Not Given 08/26/22 0224)  sodium chloride flush (NS) 0.9 % injection 3 mL (3 mLs Intravenous Given 08/26/22 0125)  acetaminophen (TYLENOL) tablet 650 mg (has no administration in time range)    Or  acetaminophen (TYLENOL) suppository 650 mg (has no administration in time range)  polyethylene glycol (MIRALAX / GLYCOLAX) packet 17 g (has no administration in time range)  furosemide (LASIX) injection 40 mg (40 mg Intravenous Given 08/26/22 0122)    Mobility walks with person assist     Focused  Assessments    R Recommendations: See Admitting Provider Note  Report given to:   Additional Notes:

## 2022-08-27 ENCOUNTER — Inpatient Hospital Stay (HOSPITAL_COMMUNITY): Payer: Medicaid Other

## 2022-08-27 DIAGNOSIS — I10 Essential (primary) hypertension: Secondary | ICD-10-CM | POA: Diagnosis not present

## 2022-08-27 DIAGNOSIS — R609 Edema, unspecified: Secondary | ICD-10-CM | POA: Diagnosis not present

## 2022-08-27 DIAGNOSIS — G894 Chronic pain syndrome: Secondary | ICD-10-CM | POA: Diagnosis not present

## 2022-08-27 DIAGNOSIS — I509 Heart failure, unspecified: Secondary | ICD-10-CM | POA: Diagnosis not present

## 2022-08-27 DIAGNOSIS — F418 Other specified anxiety disorders: Secondary | ICD-10-CM | POA: Diagnosis not present

## 2022-08-27 DIAGNOSIS — I824Z2 Acute embolism and thrombosis of unspecified deep veins of left distal lower extremity: Secondary | ICD-10-CM

## 2022-08-27 DIAGNOSIS — I82409 Acute embolism and thrombosis of unspecified deep veins of unspecified lower extremity: Secondary | ICD-10-CM | POA: Diagnosis present

## 2022-08-27 LAB — COOXEMETRY PANEL
Carboxyhemoglobin: 2.1 % — ABNORMAL HIGH (ref 0.5–1.5)
Carboxyhemoglobin: 2.3 % — ABNORMAL HIGH (ref 0.5–1.5)
Methemoglobin: <0.7 % (ref 0.0–1.5)
Methemoglobin: 0.8 % (ref 0.0–1.5)
O2 Saturation: 56.5 %
O2 Saturation: 80.6 %
Total hemoglobin: 11.1 g/dL — ABNORMAL LOW (ref 12.0–16.0)
Total hemoglobin: 11.9 g/dL — ABNORMAL LOW (ref 12.0–16.0)

## 2022-08-27 LAB — CBC
HCT: 35.1 % — ABNORMAL LOW (ref 36.0–46.0)
Hemoglobin: 11 g/dL — ABNORMAL LOW (ref 12.0–15.0)
MCH: 26.5 pg (ref 26.0–34.0)
MCHC: 31.3 g/dL (ref 30.0–36.0)
MCV: 84.6 fL (ref 80.0–100.0)
Platelets: 202 10*3/uL (ref 150–400)
RBC: 4.15 MIL/uL (ref 3.87–5.11)
RDW: 15.9 % — ABNORMAL HIGH (ref 11.5–15.5)
WBC: 5.5 10*3/uL (ref 4.0–10.5)
nRBC: 0.4 % — ABNORMAL HIGH (ref 0.0–0.2)

## 2022-08-27 LAB — VITAMIN B12: Vitamin B-12: 3022 pg/mL — ABNORMAL HIGH (ref 180–914)

## 2022-08-27 LAB — IRON AND TIBC
Iron: 38 ug/dL (ref 28–170)
Saturation Ratios: 9 % — ABNORMAL LOW (ref 10.4–31.8)
TIBC: 434 ug/dL (ref 250–450)
UIBC: 396 ug/dL

## 2022-08-27 LAB — COMPREHENSIVE METABOLIC PANEL WITH GFR
ALT: 119 U/L — ABNORMAL HIGH (ref 0–44)
AST: 56 U/L — ABNORMAL HIGH (ref 15–41)
Albumin: 3 g/dL — ABNORMAL LOW (ref 3.5–5.0)
Alkaline Phosphatase: 86 U/L (ref 38–126)
Anion gap: 13 (ref 5–15)
BUN: 12 mg/dL (ref 6–20)
CO2: 25 mmol/L (ref 22–32)
Calcium: 9.3 mg/dL (ref 8.9–10.3)
Chloride: 103 mmol/L (ref 98–111)
Creatinine, Ser: 1.34 mg/dL — ABNORMAL HIGH (ref 0.44–1.00)
GFR, Estimated: 47 mL/min — ABNORMAL LOW
Glucose, Bld: 120 mg/dL — ABNORMAL HIGH (ref 70–99)
Potassium: 3.8 mmol/L (ref 3.5–5.1)
Sodium: 141 mmol/L (ref 135–145)
Total Bilirubin: 0.8 mg/dL (ref 0.3–1.2)
Total Protein: 6.4 g/dL — ABNORMAL LOW (ref 6.5–8.1)

## 2022-08-27 LAB — FOLATE: Folate: 20.3 ng/mL

## 2022-08-27 LAB — GLUCOSE, CAPILLARY
Glucose-Capillary: 120 mg/dL — ABNORMAL HIGH (ref 70–99)
Glucose-Capillary: 158 mg/dL — ABNORMAL HIGH (ref 70–99)
Glucose-Capillary: 108 mg/dL — ABNORMAL HIGH (ref 70–99)
Glucose-Capillary: 150 mg/dL — ABNORMAL HIGH (ref 70–99)

## 2022-08-27 LAB — HEMOGLOBIN A1C
Hgb A1c MFr Bld: 6.1 % — ABNORMAL HIGH (ref 4.8–5.6)
Mean Plasma Glucose: 128.37 mg/dL

## 2022-08-27 LAB — LIPID PANEL
Cholesterol: 77 mg/dL (ref 0–200)
HDL: 20 mg/dL — ABNORMAL LOW
LDL Cholesterol: 42 mg/dL (ref 0–99)
Total CHOL/HDL Ratio: 3.9 ratio
Triglycerides: 75 mg/dL
VLDL: 15 mg/dL (ref 0–40)

## 2022-08-27 LAB — MAGNESIUM: Magnesium: 1.6 mg/dL — ABNORMAL LOW (ref 1.7–2.4)

## 2022-08-27 LAB — TSH: TSH: 1.486 u[IU]/mL (ref 0.350–4.500)

## 2022-08-27 LAB — FERRITIN: Ferritin: 62 ng/mL (ref 11–307)

## 2022-08-27 LAB — HEPARIN LEVEL (UNFRACTIONATED): Heparin Unfractionated: >1.1 IU/mL — ABNORMAL HIGH (ref 0.30–0.70)

## 2022-08-27 LAB — RETICULOCYTES
Immature Retic Fract: 33.8 % — ABNORMAL HIGH (ref 2.3–15.9)
RBC.: 4.11 MIL/uL (ref 3.87–5.11)
Retic Count, Absolute: 138.5 10*3/uL (ref 19.0–186.0)
Retic Ct Pct: 3.4 % — ABNORMAL HIGH (ref 0.4–3.1)

## 2022-08-27 MED ORDER — HEPARIN (PORCINE) 25000 UT/250ML-% IV SOLN
950.0000 [IU]/h | INTRAVENOUS | Status: DC
  Administered 2022-08-27 – 2022-08-28 (×2): 1000 [IU]/h via INTRAVENOUS
  Filled 2022-08-27 (×2): qty 250

## 2022-08-27 MED ORDER — HEPARIN (PORCINE) 25000 UT/250ML-% IV SOLN
1200.0000 [IU]/h | INTRAVENOUS | Status: AC
  Administered 2022-08-27: 1200 [IU]/h via INTRAVENOUS
  Filled 2022-08-27: qty 250

## 2022-08-27 MED ORDER — POTASSIUM CHLORIDE CRYS ER 20 MEQ PO TBCR
40.0000 meq | EXTENDED_RELEASE_TABLET | Freq: Once | ORAL | Status: AC
  Administered 2022-08-27: 40 meq via ORAL
  Filled 2022-08-27: qty 2

## 2022-08-27 MED ORDER — SODIUM CHLORIDE 0.9 % IV SOLN
250.0000 mg | Freq: Every day | INTRAVENOUS | Status: AC
  Administered 2022-08-27 – 2022-08-28 (×2): 250 mg via INTRAVENOUS
  Filled 2022-08-27 (×2): qty 20

## 2022-08-27 MED ORDER — MAGNESIUM SULFATE 50 % IJ SOLN
4.0000 g | Freq: Once | INTRAVENOUS | Status: AC
  Administered 2022-08-27: 4 g via INTRAVENOUS
  Filled 2022-08-27: qty 8

## 2022-08-27 MED ORDER — FENTANYL CITRATE PF 50 MCG/ML IJ SOSY
12.5000 ug | PREFILLED_SYRINGE | INTRAMUSCULAR | Status: DC | PRN
  Administered 2022-08-27 (×2): 12.5 ug via INTRAVENOUS
  Filled 2022-08-27 (×3): qty 1

## 2022-08-27 MED ORDER — HEPARIN BOLUS VIA INFUSION
4500.0000 [IU] | Freq: Once | INTRAVENOUS | Status: AC
  Administered 2022-08-27: 4500 [IU] via INTRAVENOUS
  Filled 2022-08-27: qty 4500

## 2022-08-27 MED ORDER — FUROSEMIDE 10 MG/ML IJ SOLN
80.0000 mg | Freq: Two times a day (BID) | INTRAMUSCULAR | Status: DC
  Administered 2022-08-27 – 2022-08-28 (×2): 80 mg via INTRAVENOUS
  Filled 2022-08-27 (×2): qty 8

## 2022-08-27 MED ORDER — SACUBITRIL-VALSARTAN 24-26 MG PO TABS
1.0000 | ORAL_TABLET | Freq: Two times a day (BID) | ORAL | Status: DC
  Administered 2022-08-28: 1 via ORAL
  Filled 2022-08-27: qty 1

## 2022-08-27 NOTE — Progress Notes (Signed)
[facility]                                                                              Kendra Griffith, is a 57 y.o. female, DOB - [birth year], JXB:[id number] Admit date - 08/25/2022    Outpatient Primary MD for the patient is Claiborne Rigg, NP  LOS - 1  days  Chief Complaint  Patient presents with   Depression   nausea       Brief summary   Patient is a 57 year old female with history of diabetes mellitus, HTN, HLP, bipolar disorder, chronic pain, depression/anxiety presented to ED with multiple complaints including loss of appetite, nausea, shortness of breath with exertion, lower extremity swelling.  Patient reported approximately 1 month of poor appetite and nausea, new onset of bilateral lower leg swelling over the past week or so.  She reported that in the last week she is having worsening exertional dyspnea and difficulty sleeping.  Denied any chest pain, abdominal pain or fevers. In ED, afebrile, O2 sats 100% on room air, BP 133/98, RR 18, pulse 95 Chest x-ray showed cardiomegaly, mildly elevated LFTs, BNP 2503 Received Lasix 40 mg IV x 1 in ED, cardiology was consulted.   Assessment & Plan    Principal Problem:   New onset of congestive heart failure (HCC), acute systolic and diastolic, biventricular -Presented with volume overload, shortness of breath, lower extremity edema, elevated BNP, cardiomegaly on chest x-ray -2D echo: EF < 20%, global hypokinesis, grade 3 DD, right ventricular systolic function moderately reduced, moderately elevated pulmonary artery systolic pressure, moderate TR. - Cardiology following, started IV lasix increased to 80mg  IV BID, neg 1.6L - continue aldactone, farxiga - will likely need right and left heart cath   Active Problems: Acute Left LE DVT: - Venous Doppler + ac DVT, started heparin gtt - f/u VQ scan - f/u hypercoag panel     Elevated transaminase level -Likely due to #1, congestive hepatopathy.  Acute  hepatitis panel non reactive.  -RUQ ultrasound showed gallbladder sludge without sonographic evidence of acute cholecystitis -Currently no abdominal pain, LFTs improving    Essential hypertension, benign -Continue IV Lasix, losartan, aldactone     Chronic pain syndrome -Continue amitriptyline, gabapentin  Diet-controlled diabetes mellitus -Hemoglobin A1c 6.5 on 05/18/2022, repeat A1c -Placed on sliding scale insulin while inpatient CBG (last 3)  Recent Labs    08/26/22 1709 08/27/22 0714 08/27/22 1213  GLUCAP 168* 120* 158*   Depression with anxiety -Continue amitriptyline, hydroxyzine as needed  Estimated body mass index is 26.6 kg/m as calculated from the following:   Height as of this encounter: 5\' 5"  (1.651 m).   Weight as of this encounter: 72.5 kg.  Code Status: Full code DVT Prophylaxis:  Place TED hose Start: 08/26/22 1315   Level of Care: Level of care: Telemetry Cardiac Family Communication: Updated patient Disposition Plan:      Remains inpatient appropriate: Need cardiac workup   Procedures:  2D echo Venous Dopplers lower extremities  Consultants:   Cardiology  Antimicrobials: None  Medications  amitriptyline  75 mg Oral QHS   Chlorhexidine Gluconate Cloth  6 each Topical  Daily   dapagliflozin propanediol  10 mg Oral Daily   furosemide  80 mg Intravenous BID   gabapentin  300 mg Oral BID   insulin aspart  0-5 Units Subcutaneous QHS   insulin aspart  0-9 Units Subcutaneous TID WC   pantoprazole  40 mg Oral Daily   rosuvastatin  5 mg Oral Daily   [START ON 08/28/2022] sacubitril-valsartan  1 tablet Oral BID   sodium chloride flush  10-40 mL Intracatheter Q12H   spironolactone  12.5 mg Oral Daily      Subjective:   Kendra Griffith was seen and examined today.  Still significant volume overload with lower extremity edema.  States still having cramping in the back of her left calf.  No fevers or chills, no nausea vomiting or chest  pain.  Objective:   Vitals:   08/26/22 2334 08/27/22 0424 08/27/22 0707 08/27/22 0751  BP: 104/72 (!) 121/94  (!) 114/93  Pulse: 92 93    Resp: 20 20  16   Temp: 97.8 F (36.6 C) 97.8 F (36.6 C)  97.7 F (36.5 C)  TempSrc: Oral Oral  Oral  SpO2: 100% 100%    Weight:   72.5 kg   Height:        Intake/Output Summary (Last 24 hours) at 08/27/2022 1244 Last data filed at 08/27/2022 0752 Gross per 24 hour  Intake 240 ml  Output 1200 ml  Net -960 ml     Wt Readings from Last 3 Encounters:  08/27/22 72.5 kg  08/17/22 75.8 kg  05/18/22 76.7 kg   Physical Exam General: Alert and oriented x 3, NAD HEENT: JVD + Cardiovascular: S1 S2 clear, RRR.  Respiratory: CTAB, no wheezing Gastrointestinal: Soft, nontender, nondistended, NBS Ext: 1+ pedal edema bilaterally Neuro: no new deficits Psych: Normal affect     Data Reviewed:  I have personally reviewed following labs    CBC Lab Results  Component Value Date   WBC 5.5 08/27/2022   RBC 4.15 08/27/2022   HGB 11.0 (L) 08/27/2022   HCT 35.1 (L) 08/27/2022   MCV 84.6 08/27/2022   MCH 26.5 08/27/2022   PLT 202 08/27/2022   MCHC 31.3 08/27/2022   RDW 15.9 (H) 08/27/2022   LYMPHSABS 0.7 08/25/2022   MONOABS 0.2 08/25/2022   EOSABS 0.0 08/25/2022   BASOSABS 0.0 08/25/2022     Last metabolic panel Lab Results  Component Value Date   NA 141 08/27/2022   K 3.8 08/27/2022   CL 103 08/27/2022   CO2 25 08/27/2022   BUN 12 08/27/2022   CREATININE 1.34 (H) 08/27/2022   GLUCOSE 120 (H) 08/27/2022   GFRNONAA 47 (L) 08/27/2022   GFRAA 81 02/24/2020   CALCIUM 9.3 08/27/2022   PROT 6.4 (L) 08/27/2022   ALBUMIN 3.0 (L) 08/27/2022   LABGLOB 2.5 05/18/2022   AGRATIO 1.8 05/18/2022   BILITOT 0.8 08/27/2022   ALKPHOS 86 08/27/2022   AST 56 (H) 08/27/2022   ALT 119 (H) 08/27/2022   ANIONGAP 13 08/27/2022    CBG (last 3)  Recent Labs    08/26/22 1709 08/27/22 0714 08/27/22 1213  GLUCAP 168* 120* 158*       Coagulation Profile: No results for input(s): "INR", "PROTIME" in the last 168 hours.   Radiology Studies: I have personally reviewed the imaging studies  [place] EKG SITE RITE  Result Date: 08/26/2022 If Site Rite image not attached, placement could not be confirmed due to current cardiac rhythm.  ECHOCARDIOGRAM COMPLETE  Result Date: 08/26/2022  ECHOCARDIOGRAM REPORT   Patient Name:   SHAIEL PELZ Date of Exam: 08/26/2022 Medical Rec #:  [id number]       Height:       65.0 in Accession #:    [id number]      Weight:       161.6 lb Date of Birth:  11-21-1965       BSA:          1.807 m Patient Age:    56 years        BP:           130/99 mmHg Patient Gender: F               HR:           98 bpm. Exam Location:  Inpatient Procedure: 2D Echo, Cardiac Doppler, Color Doppler and Intracardiac            Opacification Agent Indications:    Dyspnea  History:        Patient has no prior history of Echocardiogram examinations.                 Risk Factors:Hypertension and Diabetes.  Sonographer:    Darlys Gales Referring Phys: [id number] TIMOTHY S OPYD IMPRESSIONS  1. Left ventricular ejection fraction, by estimation, is <20%. The left ventricle has severely decreased function. The left ventricle demonstrates global hypokinesis. The left ventricular internal cavity size was moderately dilated. Left ventricular diastolic parameters are consistent with Grade III diastolic dysfunction (restrictive).  2. Right ventricular systolic function is moderately reduced. The right ventricular size is normal. There is moderately elevated pulmonary artery systolic pressure. The estimated right ventricular systolic pressure is 58.0 mmHg.  3. Left atrial size was moderately dilated.  4. Right atrial size was mildly dilated.  5. The mitral valve is normal in structure. Mild mitral valve regurgitation. No evidence of mitral stenosis.  6. Tricuspid valve regurgitation is moderate.  7. The aortic valve is tricuspid. There is mild  calcification of the aortic valve. Aortic valve regurgitation is not visualized. No aortic stenosis is present.  8. The inferior vena cava is dilated in size with <50% respiratory variability, suggesting right atrial pressure of 15 mmHg. FINDINGS  Left Ventricle: Left ventricular ejection fraction, by estimation, is <20%. The left ventricle has severely decreased function. The left ventricle demonstrates global hypokinesis. Definity contrast agent was given IV to delineate the left ventricular endocardial borders. The left ventricular internal cavity size was moderately dilated. There is no left ventricular hypertrophy. Left ventricular diastolic parameters are consistent with Grade III diastolic dysfunction (restrictive). Right Ventricle: The right ventricular size is normal. No increase in right ventricular wall thickness. Right ventricular systolic function is moderately reduced. There is moderately elevated pulmonary artery systolic pressure. The tricuspid regurgitant velocity is 3.28 m/s, and with an assumed right atrial pressure of 15 mmHg, the estimated right ventricular systolic pressure is 58.0 mmHg. Left Atrium: Left atrial size was moderately dilated. Right Atrium: Right atrial size was mildly dilated. Pericardium: There is no evidence of pericardial effusion. Mitral Valve: The mitral valve is normal in structure. Mild mitral valve regurgitation. No evidence of mitral valve stenosis. Tricuspid Valve: The tricuspid valve is normal in structure. Tricuspid valve regurgitation is moderate . No evidence of tricuspid stenosis. Aortic Valve: The aortic valve is tricuspid. There is mild calcification of the aortic valve. Aortic valve regurgitation is not visualized. No aortic stenosis is present. Aortic valve mean gradient measures 2.0 mmHg. Aortic  valve peak gradient measures 3.4 mmHg. Aortic valve area, by VTI measures 1.66 cm. Pulmonic Valve: The pulmonic valve was normal in structure. Pulmonic valve  regurgitation is mild. No evidence of pulmonic stenosis. Aorta: The aortic root is normal in size and structure. Venous: The inferior vena cava is dilated in size with less than 50% respiratory variability, suggesting right atrial pressure of 15 mmHg. IAS/Shunts: No atrial level shunt detected by color flow Doppler.  LEFT VENTRICLE PLAX 2D LVIDd:         5.75 cm      Diastology LVIDs:         5.50 cm      LV e' medial:    2.72 cm/s LV PW:         0.85 cm      LV E/e' medial:  30.8 LV IVS:        0.50 cm      LV e' lateral:   9.79 cm/s LVOT diam:     1.80 cm      LV E/e' lateral: 8.6 LV SV:         20 LV SV Index:   11 LVOT Area:     2.54 cm  LV Volumes (MOD) LV vol d, MOD A4C: 124.0 ml LV vol s, MOD A4C: 98.0 ml LV SV MOD A4C:     124.0 ml RIGHT VENTRICLE RV S prime:     8.27 cm/s TAPSE (M-mode): 0.6 cm LEFT ATRIUM           Index        RIGHT ATRIUM           Index LA Vol (A4C): 83.6 ml 46.24 ml/m  RA Area:     16.30 cm                                    RA Volume:   39.90 ml  22.08 ml/m  AORTIC VALVE AV Area (Vmax):    2.11 cm AV Area (Vmean):   1.75 cm AV Area (VTI):     1.66 cm AV Vmax:           91.60 cm/s AV Vmean:          70.600 cm/s AV VTI:            0.119 m AV Peak Grad:      3.4 mmHg AV Mean Grad:      2.0 mmHg LVOT Vmax:         75.80 cm/s LVOT Vmean:        48.600 cm/s LVOT VTI:          0.078 m LVOT/AV VTI ratio: 0.65  AORTA Ao Root diam: 2.10 cm Ao Asc diam:  2.50 cm MITRAL VALVE               TRICUSPID VALVE MV Area (PHT): 4.77 cm    TR Peak grad:   43.0 mmHg MV Decel Time: 159 msec    TR Vmax:        328.00 cm/s MV E velocity: 83.80 cm/s                            SHUNTS                            Systemic  VTI:  0.08 m                            Systemic Diam: 1.80 cm Arvilla Meres MD Electronically signed by Arvilla Meres MD Signature Date/Time: 08/26/2022/10:15:44 AM    Final    US Abdomen Limited RUQ (LIVER/GB)  Result Date: 08/26/2022 CLINICAL DATA:  Elevated liver function  tests EXAM: ULTRASOUND ABDOMEN LIMITED RIGHT UPPER QUADRANT COMPARISON:  None Available. FINDINGS: Gallbladder: Layering sludge is seen within the gallbladder. The gallbladder, however, is not distended, there is no gallbladder wall thickening, and no pericholecystic fluid is identified. The sonographic Eulah Pont sign is reportedly negative. Common bile duct: Diameter: 4 mm in proximal diameter Liver: No focal lesion identified. Within normal limits in parenchymal echogenicity. Portal vein is patent on color Doppler imaging with normal direction of blood flow towards the liver. Other: None. IMPRESSION: 1. Gallbladder sludge without sonographic evidence of acute cholecystitis. Electronically Signed   By: Helyn Numbers M.D.   On: 08/26/2022 02:02   DG Chest 2 View  Result Date: 08/25/2022 CLINICAL DATA:  Cough EXAM: CHEST - 2 VIEW COMPARISON:  12/10/2017 FINDINGS: Interval development of mild cardiomegaly. Mild right basilar atelectasis. No pneumothorax or pleural effusion. Pulmonary vascularity is normal. No acute bone abnormality. IMPRESSION: 1. Mild cardiomegaly. 2. Mild right basilar atelectasis. Electronically Signed   By: Helyn Numbers M.D.   On: 08/25/2022 21:33   CT Head Wo Contrast  Result Date: 08/25/2022 CLINICAL DATA:  Transient slurred speech. EXAM: CT HEAD WITHOUT CONTRAST TECHNIQUE: Contiguous axial images were obtained from the base of the skull through the vertex without intravenous contrast. RADIATION DOSE REDUCTION: This exam was performed according to the departmental dose-optimization program which includes automated exposure control, adjustment of the mA and/or kV according to patient size and/or use of iterative reconstruction technique. COMPARISON:  MRI examination dated May 05, 2022 FINDINGS: Brain: No evidence of acute infarction, hemorrhage, hydrocephalus, extra-axial collection or mass lesion/mass effect. Vascular: No hyperdense vessel or unexpected calcification. Skull: Normal.  Negative for fracture or focal lesion. Sinuses/Orbits: No acute finding. Other: None. IMPRESSION: No acute intracranial pathology. Electronically Signed   By: Larose Hires D.O.   On: 08/25/2022 18:09       Abygayle Deltoro M.D. [facility] 08/27/2022, 12:44 PM  Available via Epic secure chat 7am-7pm After 7 pm, please refer to night coverage provider listed on amion.

## 2022-08-27 NOTE — Progress Notes (Addendum)
Patient ID: Kendra Griffith, female   DOB: January 08, 1966, 57 y.o.   MRN: [id number]     Advanced Heart Failure Rounding Note  PCP-Cardiologist: None   Subjective:    I/Os net negative 1880, weight down.  Breathing better.    Lower extremity venous dopplers showed left DVT today.   Co-ox 57%, CVP 14.  Creatinine 0.8 => 1.3.    Objective:   Weight Range: 73.3 kg Body mass index is 26.89 kg/m.   Vital Signs:   Temp:  [97.6 F (36.4 C)-98.7 F (37.1 C)] 97.7 F (36.5 C) (05/18 0751) Pulse Rate:  [92-100] 93 (05/18 0424) Resp:  [16-20] 16 (05/18 0751) BP: (104-123)/(72-94) 114/93 (05/18 0751) SpO2:  [96 %-100 %] 100 % (05/18 0424) Last BM Date : 08/24/22  Weight change: Filed Weights   08/25/22 1651 08/26/22 0612  Weight: 75.8 kg 73.3 kg    Intake/Output:   Intake/Output Summary (Last 24 hours) at 08/27/2022 1033 Last data filed at 08/27/2022 0752 Gross per 24 hour  Intake 240 ml  Output 2000 ml  Net -1760 ml      Physical Exam    General:  Well appearing. No resp difficulty HEENT: Normal Neck: Supple. JVP 14-16 cm. Carotids 2+ bilat; no bruits. No lymphadenopathy or thyromegaly appreciated. Cor: PMI nondisplaced. Regular rate & rhythm. +S3. Lungs: Clear Abdomen: Soft, nontender, nondistended. No hepatosplenomegaly. No bruits or masses. Good bowel sounds. Extremities: No cyanosis, clubbing, rash.  1+ ankle edema bilaterally.  Neuro: Alert & orientedx3, cranial nerves grossly intact. moves all 4 extremities w/o difficulty. Affect pleasant   Telemetry   NSR (personally reviewed)  Labs    CBC Recent Labs    08/25/22 1651 08/26/22 0043 08/27/22 0645 08/27/22 0735  WBC 3.8*   < > SPECIMEN CLOTTED 5.5  NEUTROABS 2.8  --   --   --   HGB 11.3*   < > SPECIMEN CLOTTED 11.0*  HCT 37.8   < > SPECIMEN CLOTTED 35.1*  MCV 89.4   < > SPECIMEN CLOTTED 84.6  PLT 198   < > SPECIMEN CLOTTED 202   < > = values in this interval not displayed.   Basic Metabolic  Panel Recent Labs    08/26/22 0043 08/27/22 0645  NA 135 141  K 4.2 3.8  CL 106 103  CO2 19* 25  GLUCOSE 138* 120*  BUN 13 12  CREATININE 0.80 1.34*  CALCIUM 8.8* 9.3  MG  --  1.6*   Liver Function Tests Recent Labs    08/26/22 0043 08/27/22 0645  AST 79* 56*  ALT 158* 119*  ALKPHOS 89 86  BILITOT 0.6 0.8  PROT 6.3* 6.4*  ALBUMIN 3.0* 3.0*   Recent Labs    08/25/22 1651  LIPASE 34   Cardiac Enzymes No results for input(s): "CKTOTAL", "CKMB", "CKMBINDEX", "TROPONINI" in the last 72 hours.  BNP: BNP (last 3 results) Recent Labs    08/25/22 2230  BNP 2,502.5*    ProBNP (last 3 results) No results for input(s): "PROBNP" in the last 8760 hours.   D-Dimer No results for input(s): "DDIMER" in the last 72 hours. Hemoglobin A1C No results for input(s): "HGBA1C" in the last 72 hours. Fasting Lipid Panel Recent Labs    08/27/22 0645  CHOL 77  HDL 20*  LDLCALC 42  TRIG 75  CHOLHDL 3.9   Thyroid Function Tests No results for input(s): "TSH", "T4TOTAL", "T3FREE", "THYROIDAB" in the last 72 hours.  Invalid input(s): "FREET3"  Other results:  Imaging    [place] EKG SITE RITE  Result Date: 08/26/2022 If Site Rite image not attached, placement could not be confirmed due to current cardiac rhythm.    Medications:     Scheduled Medications:  amitriptyline  75 mg Oral QHS   Chlorhexidine Gluconate Cloth  6 each Topical Daily   dapagliflozin propanediol  10 mg Oral Daily   furosemide  80 mg Intravenous BID   gabapentin  300 mg Oral BID   insulin aspart  0-5 Units Subcutaneous QHS   insulin aspart  0-9 Units Subcutaneous TID WC   pantoprazole  40 mg Oral Daily   potassium chloride  40 mEq Oral Once   rosuvastatin  5 mg Oral Daily   [START ON 08/28/2022] sacubitril-valsartan  1 tablet Oral BID   sodium chloride flush  10-40 mL Intracatheter Q12H   spironolactone  12.5 mg Oral Daily    Infusions:  heparin 1,200 Units/hr (08/27/22 1030)   magnesium  sulfate 4 g in dextrose 5 % 250 mL 4 g (08/27/22 1010)    PRN Medications: acetaminophen **OR** acetaminophen, albuterol, hydrOXYzine, polyethylene glycol, sodium chloride flush    Assessment/Plan   1.  Acute on chronic systolic CHF:  Echo 5/24 EF <20%, GIIIDD, RV mod reduced, mod elevated PASP ~58, LA mod dilated, RA mildly dilated, mild MR, mod TR. NYHA III-IV at admission,  previous hx heavy alcohol use but quit a number of years ago. HIV negative.  No recent viral-type illness.  She does have CAD risk factors with family history, heavy smoking, diabetes. Not ACS-like presentation.  CVP 14 today, diuresed well with IV Lasix yesterday. Co-ox 57%.  Creatinine higher at 1.3.  - Lasix 80 mg IV bid today. Replace K and Mg.  - Spironolactone 12.5 daily.  - Farxiga 10 daily - Stop losartan (had today), start Entresto 24/26 bid tomorrow if creatinine stable.  - Will need L/RHC once better diuresed (will schedule for Monday), if not revealing will need cMRI 2.  HTN: BP controlled.  3.  Elevated LFTs: Trending down.  Suspect hepatic congestion with CHF.  DMII: Hgb A1c 6.5 2/24 - per primary 5. Smoker: Currently heavy smoker, 2 PPD. Has smoked since she was 11. - cessation heavily encouraged 6. DVT: Left leg DVT found.  Had DVT back in 1980s found after she delivered her son.   - Will check factor V Leiden, prothrombin gene mutation, and antiphospholipid antibody syndrome workup.  - V/Q scan to assess for PE. - Continue heparin gtt.  7. Fe deficiency anemia: Will give IV Fe.  Check hemoccult.   Length of Stay: 1  Marca Ancona, MD  08/27/2022, 10:33 AM  Advanced Heart Failure Team Pager [phone] (M-F; 7a - 5p)  Please contact [facility] for night-coverage after hours (5p -7a ) and weekends on [email or web address]

## 2022-08-27 NOTE — Progress Notes (Signed)
ANTICOAGULATION CONSULT NOTE - Initial Consult  Pharmacy Consult for heparin Indication: DVT  Allergies  Allergen Reactions   Amoxicillin     Pain Did it involve swelling of the face/tongue/throat, SOB, or low BP? No Did it involve sudden or severe rash/hives, skin peeling, or any reaction on the inside of your mouth or nose? No Did you need to seek medical attention at a hospital or doctor's office? Yes When did it last happen?      5 years ago If all above answers are "NO", may proceed with cephalosporin use.    Darvocet [Propoxyphene N-Acetaminophen] Nausea And Vomiting    Makes her jittery   Hydrocodone-Acetaminophen Nausea And Vomiting    Upset stomach   Percocet [Oxycodone-Acetaminophen]     Makes her jittery   Valium [Diazepam] Other (See Comments)    Makes pt. Feel "out of wack"    Patient Measurements: Height: 5\' 5"  (165.1 cm) Weight: 73.3 kg (161 lb 9.6 oz) IBW/kg (Calculated) : 57 Heparin Dosing Weight: 72.6 kg  Vital Signs: Temp: 97.7 F (36.5 C) (05/18 0751) Temp Source: Oral (05/18 0751) BP: 114/93 (05/18 0751) Pulse Rate: 93 (05/18 0424)  Labs: Recent Labs    08/25/22 1651 08/26/22 0043 08/26/22 1628 08/27/22 0645  HGB 11.3* 11.4*  --  SPECIMEN CLOTTED  HCT 37.8 36.2  --  SPECIMEN CLOTTED  PLT 198 195  --  SPECIMEN CLOTTED  CREATININE 0.95 0.80  --  1.34*  TROPONINIHS  --   --  12  --     Estimated Creatinine Clearance: 47 mL/min (A) (by C-G formula based on SCr of 1.34 mg/dL (H)).   Medical History: Past Medical History:  Diagnosis Date   ABSCESS, TOOTH 08/20/2009   Qualifier: Diagnosis of  By: Huntley Dec, Scott     Anemia    Anxiety    Bipolar disorder (HCC)    Blood transfusion without reported diagnosis    childbirth   Chronic pain    feet and back   Clotting disorder (HCC)    childbirth   Dental caries    lesion soft palate   Depression    Diabetes mellitus without complication (HCC)    prediabetes   GERD (gastroesophageal  reflux disease)    Hypertension    Injury    right side from jumping off bunkbed   Pre-diabetes    Sickle cell trait (HCC)    Wears glasses     Medications:  Medications Prior to Admission  Medication Sig Dispense Refill Last Dose   acetaminophen-codeine (TYLENOL #3) 300-30 MG tablet Take 1-2 tablets by mouth every 4 (four) hours as needed for moderate pain. 60 tablet 0 08/25/2022   albuterol (VENTOLIN HFA) 108 (90 Base) MCG/ACT inhaler Inhale 2 puffs into the lungs every 6 (six) hours as needed for wheezing or shortness of breath. 18 g 0 08/26/2022   amitriptyline (ELAVIL) 75 MG tablet Take 1 tablet (75 mg total) by mouth at bedtime for pain and depression 90 tablet 1 08/25/2022   Biotin 5 MG CAPS Take 5 mg by mouth 3 (three) times daily.   08/26/2022   cholecalciferol (VITAMIN D3) 25 MCG (1000 UT) tablet Take 1,000 Units by mouth daily.   Past Week   famotidine (PEPCID) 20 MG tablet Take 1 tablet (20 mg total) by mouth 2 (two) times daily for heartburn 180 tablet 1 08/25/2022   gabapentin (NEURONTIN) 300 MG capsule Take 1 capsule (300 mg total) by mouth 2 (two) times daily. 180 capsule  3 08/26/2022   GLUTAMINE PO Take 500 mg by mouth daily.   08/26/2022   hydrOXYzine (ATARAX) 25 MG tablet Take 1 tablet (25 mg total) by mouth 3 (three) times daily as needed. (Patient taking differently: Take 25 mg by mouth 3 (three) times daily as needed for anxiety.) 90 tablet 1 Past Week   ibuprofen (ADVIL) 800 MG tablet Take 1 tablet (800 mg total) by mouth every 8 (eight) hours as needed. (Patient taking differently: Take 800 mg by mouth every 8 (eight) hours as needed for mild pain.) 60 tablet 1 Past Week   losartan (COZAAR) 25 MG tablet Take 1 tablet (25 mg total) by mouth daily for blood pressure (Patient taking differently: Take 25 mg by mouth every evening.) 90 tablet 1 Past Week   omeprazole (PRILOSEC) 20 MG capsule Take 1 capsule (20 mg total) by mouth daily for heartburn 90 capsule 1 08/26/2022    rosuvastatin (CRESTOR) 5 MG tablet Take 1 tablet (5 mg total) by mouth daily for cholesterol 90 tablet 1 08/26/2022   vitamin B-12 (CYANOCOBALAMIN) 500 MCG tablet Take 500 mcg by mouth daily.   08/26/2022   vitamin C (ASCORBIC ACID) 500 MG tablet Take 500 mg by mouth daily.   08/26/2022   chlorhexidine (HIBICLENS) 4 % external liquid Apply topically daily as needed. Use when bathing or showering (Patient not taking: Reported on 08/26/2022) 946 mL 0 Not Taking   gentamicin (GARAMYCIN) 0.3 % ophthalmic solution Place 1 drop into the left eye 4 (four) times daily. (Patient not taking: Reported on 08/26/2022) 5 mL 0 Completed Course   hydroquinone 4 % cream Apply topically 2 (two) times daily. (Patient not taking: Reported on 08/26/2022)   Not Taking   Misc. Devices MISC Please provide patient with incontinence pads or disposable underwear or choice. N39.3 1 each PRN    nicotine (NICODERM CQ - DOSED IN MG/24 HOURS) 21 mg/24hr patch Place 1 patch (21 mg total) onto the skin daily. (Patient not taking: Reported on 08/26/2022) 42 patch 0 Not Taking   nicotine polacrilex (NICOTINE MINI) 4 MG lozenge Dissolve 1 lozenge (4 mg total) by mouth as needed for smoking cessation. (Patient not taking: Reported on 08/26/2022) 100 tablet 1 Not Taking   NONFORMULARY OR COMPOUNDED ITEM Peripheral Neuropathy Cream: Bupivacaine 1%, Doxepin 3%, Gabapentin 6%, Pentoxifylline 3%, Topiramate 1% Order faxed to [place] (Patient not taking: Reported on 08/26/2022) 1 each 3 Not Taking   Scheduled:   amitriptyline  75 mg Oral QHS   Chlorhexidine Gluconate Cloth  6 each Topical Daily   dapagliflozin propanediol  10 mg Oral Daily   furosemide  60 mg Intravenous BID   gabapentin  300 mg Oral BID   insulin aspart  0-5 Units Subcutaneous QHS   insulin aspart  0-9 Units Subcutaneous TID WC   losartan  25 mg Oral Daily   pantoprazole  40 mg Oral Daily   rosuvastatin  5 mg Oral Daily   sodium chloride flush  10-40 mL  Intracatheter Q12H   spironolactone  12.5 mg Oral Daily    Assessment: 57 YO female presenting 5/16 with complaints of poor appetite, dyspnea on exertion, cough, difficulty sleeping, found to be new onset CHF. Bilateral lower extremity duplex positive for DVT. Patient not on anticoagulation PTA but did receive one dose of Lovenox 40 mg (DVT ppx) 5/17 @0953 . Pharmacy has been consulted for heparin dosing.   Hgb 11.4, plts 195 on 5/17--stable. No signs/symptoms of bleeding reported.   Goal  of Therapy:  Heparin level 0.3-0.7 units/ml Monitor platelets by anticoagulation protocol: Yes   Plan:  Give heparin 4500 units IV x1 Start heparin gtt @ 1200 units/hr  F/u heparin level in 8hrs  Monitor CBC, heparin level, and s/sx of bleeding daily    Cherylin Mylar, PharmD PGY1 Pharmacy Resident 5/18/20249:51 AM

## 2022-08-27 NOTE — Progress Notes (Signed)
ANTICOAGULATION CONSULT NOTE  Pharmacy Consult for heparin Indication: DVT  Allergies  Allergen Reactions   Amoxicillin     Pain Did it involve swelling of the face/tongue/throat, SOB, or low BP? No Did it involve sudden or severe rash/hives, skin peeling, or any reaction on the inside of your mouth or nose? No Did you need to seek medical attention at a hospital or doctor's office? Yes When did it last happen?      5 years ago If all above answers are "NO", may proceed with cephalosporin use.    Darvocet [Propoxyphene N-Acetaminophen] Nausea And Vomiting    Makes her jittery   Hydrocodone-Acetaminophen Nausea And Vomiting    Upset stomach   Percocet [Oxycodone-Acetaminophen]     Makes her jittery   Valium [Diazepam] Other (See Comments)    Makes pt. Feel "out of wack"    Patient Measurements: Height: 5\' 5"  (165.1 cm) Weight: 72.5 kg (159 lb 13.3 oz) IBW/kg (Calculated) : 57 Heparin Dosing Weight: 73 kg  Vital Signs: Temp: 97.6 F (36.4 C) (05/18 2001) Temp Source: Oral (05/18 2001) BP: 100/73 (05/18 2001) Pulse Rate: 87 (05/18 2001)  Labs: Recent Labs    08/25/22 1651 08/26/22 0043 08/26/22 1628 08/27/22 0645 08/27/22 0735 08/27/22 1914  HGB 11.3* 11.4*  --  SPECIMEN CLOTTED 11.0*  --   HCT 37.8 36.2  --  SPECIMEN CLOTTED 35.1*  --   PLT 198 195  --  SPECIMEN CLOTTED 202  --   HEPARINUNFRC  --   --   --   --   --  >1.10*  CREATININE 0.95 0.80  --  1.34*  --   --   TROPONINIHS  --   --  12  --   --   --     Estimated Creatinine Clearance: 46.8 mL/min (A) (by C-G formula based on SCr of 1.34 mg/dL (H)).   Medical History: Past Medical History:  Diagnosis Date   ABSCESS, TOOTH 08/20/2009   Qualifier: Diagnosis of  By: Huntley Dec, Scott     Anemia    Anxiety    Bipolar disorder (HCC)    Blood transfusion without reported diagnosis    childbirth   Chronic pain    feet and back   Clotting disorder (HCC)    childbirth   Dental caries    lesion soft  palate   Depression    Diabetes mellitus without complication (HCC)    prediabetes   GERD (gastroesophageal reflux disease)    Hypertension    Injury    right side from jumping off bunkbed   Pre-diabetes    Sickle cell trait (HCC)    Wears glasses     Assessment: 57 YO female presenting 5/16 with complaints of poor appetite, dyspnea on exertion, cough, difficulty sleeping, found to be new onset CHF. Bilateral lower extremity duplex positive for DVT. Patient not on anticoagulation PTA but did receive one dose of Lovenox 40 mg (DVT ppx) 5/17 @0953 . Pharmacy has been consulted for heparin dosing.   Hgb 11.4, plts 195 on 5/17--stable. No signs/symptoms of bleeding reported. Heparin level this evening > 1.1. Confirmed with RN, heparin level drawn from opposite arm.   Goal of Therapy:  Heparin level 0.3-0.7 units/ml Monitor platelets by anticoagulation protocol: Yes   Plan:  Hold heparin for 1 hour Restart heparin infusion at 1000 units/hr Check heparin level in 6 hours and daily while on heparin Continue to monitor H&H and platelets  Thank you for allowing pharmacy  to be a part of this patient's care.  Thelma Barge, PharmD Clinical Pharmacist

## 2022-08-27 NOTE — Progress Notes (Signed)
VASCULAR LAB    Bilateral lower extremity venous duplex has been performed.  See CV proc for preliminary results.  Gave verbal report to RN and messaged results to Dr. Isidoro Donning via secure chat.  Desmond Szabo, RVT 08/27/2022, 9:24 AM

## 2022-08-28 DIAGNOSIS — G894 Chronic pain syndrome: Secondary | ICD-10-CM | POA: Diagnosis not present

## 2022-08-28 DIAGNOSIS — I82432 Acute embolism and thrombosis of left popliteal vein: Secondary | ICD-10-CM

## 2022-08-28 DIAGNOSIS — F418 Other specified anxiety disorders: Secondary | ICD-10-CM | POA: Diagnosis not present

## 2022-08-28 DIAGNOSIS — I509 Heart failure, unspecified: Secondary | ICD-10-CM | POA: Diagnosis not present

## 2022-08-28 LAB — CBC
HCT: 36.4 % (ref 36.0–46.0)
Hemoglobin: 11.6 g/dL — ABNORMAL LOW (ref 12.0–15.0)
MCH: 27 pg (ref 26.0–34.0)
MCHC: 31.9 g/dL (ref 30.0–36.0)
MCV: 84.7 fL (ref 80.0–100.0)
Platelets: 230 10*3/uL (ref 150–400)
RBC: 4.3 MIL/uL (ref 3.87–5.11)
RDW: 15.7 % — ABNORMAL HIGH (ref 11.5–15.5)
WBC: 5.2 10*3/uL (ref 4.0–10.5)
nRBC: 1 % — ABNORMAL HIGH (ref 0.0–0.2)

## 2022-08-28 LAB — COOXEMETRY PANEL
Carboxyhemoglobin: 2.2 % — ABNORMAL HIGH (ref 0.5–1.5)
Carboxyhemoglobin: 2 % — ABNORMAL HIGH (ref 0.5–1.5)
Methemoglobin: 0.9 % (ref 0.0–1.5)
Methemoglobin: <0.7 % (ref 0.0–1.5)
O2 Saturation: 91.8 %
O2 Saturation: 67.8 %
Total hemoglobin: 12.1 g/dL (ref 12.0–16.0)
Total hemoglobin: 12.4 g/dL (ref 12.0–16.0)

## 2022-08-28 LAB — COMPREHENSIVE METABOLIC PANEL
ALT: 95 U/L — ABNORMAL HIGH (ref 0–44)
AST: 43 U/L — ABNORMAL HIGH (ref 15–41)
Albumin: 2.9 g/dL — ABNORMAL LOW (ref 3.5–5.0)
Alkaline Phosphatase: 100 U/L (ref 38–126)
Anion gap: 13 (ref 5–15)
BUN: 9 mg/dL (ref 6–20)
CO2: 25 mmol/L (ref 22–32)
Calcium: 8.7 mg/dL — ABNORMAL LOW (ref 8.9–10.3)
Chloride: 101 mmol/L (ref 98–111)
Creatinine, Ser: 1.14 mg/dL — ABNORMAL HIGH (ref 0.44–1.00)
GFR, Estimated: 56 mL/min — ABNORMAL LOW (ref >60)
Glucose, Bld: 163 mg/dL — ABNORMAL HIGH (ref 70–99)
Potassium: 3.1 mmol/L — ABNORMAL LOW (ref 3.5–5.1)
Sodium: 139 mmol/L (ref 135–145)
Total Bilirubin: 1 mg/dL (ref 0.3–1.2)
Total Protein: 6 g/dL — ABNORMAL LOW (ref 6.5–8.1)

## 2022-08-28 LAB — GLUCOSE, CAPILLARY
Glucose-Capillary: 160 mg/dL — ABNORMAL HIGH (ref 70–99)
Glucose-Capillary: 120 mg/dL — ABNORMAL HIGH (ref 70–99)
Glucose-Capillary: 127 mg/dL — ABNORMAL HIGH (ref 70–99)
Glucose-Capillary: 142 mg/dL — ABNORMAL HIGH (ref 70–99)

## 2022-08-28 LAB — URINE CULTURE

## 2022-08-28 LAB — HEPARIN LEVEL (UNFRACTIONATED)
Heparin Unfractionated: 0.54 IU/mL (ref 0.30–0.70)
Heparin Unfractionated: 0.64 IU/mL (ref 0.30–0.70)

## 2022-08-28 MED ORDER — SODIUM CHLORIDE 0.9% FLUSH: 3.0000 mL | INTRAVENOUS | Status: DC | PRN

## 2022-08-28 MED ORDER — SACUBITRIL-VALSARTAN 24-26 MG PO TABS
1.0000 | ORAL_TABLET | Freq: Two times a day (BID) | ORAL | Status: DC
  Administered 2022-08-28 – 2022-08-31 (×6): 1 via ORAL
  Filled 2022-08-28 (×6): qty 1

## 2022-08-28 MED ORDER — POTASSIUM CHLORIDE CRYS ER 20 MEQ PO TBCR
40.0000 meq | EXTENDED_RELEASE_TABLET | Freq: Once | ORAL | Status: AC
  Administered 2022-08-28: 40 meq via ORAL
  Filled 2022-08-28: qty 2

## 2022-08-28 MED ORDER — SODIUM CHLORIDE 0.9 % IV SOLN: 250.0000 mL | INTRAVENOUS | Status: DC | PRN

## 2022-08-28 MED ORDER — SODIUM CHLORIDE 0.9 % IV SOLN: INTRAVENOUS | Status: DC

## 2022-08-28 MED ORDER — ASPIRIN 81 MG PO CHEW
81.0000 mg | CHEWABLE_TABLET | ORAL | Status: AC
  Administered 2022-08-29: 81 mg via ORAL
  Filled 2022-08-28: qty 1

## 2022-08-28 MED ORDER — TORSEMIDE 20 MG PO TABS
20.0000 mg | ORAL_TABLET | Freq: Every day | ORAL | Status: DC
  Administered 2022-08-29: 20 mg via ORAL
  Filled 2022-08-28: qty 1

## 2022-08-28 NOTE — Progress Notes (Signed)
[facility]                                                                              Kendra Griffith, is a 57 y.o. female, DOB - 11/01/1965, UJW:[id number] Admit date - 08/25/2022    Outpatient Primary MD for the patient is Claiborne Rigg, NP  LOS - 2  days  Chief Complaint  Patient presents with   Depression   nausea       Brief summary   Patient is a 57 year old female with history of diabetes mellitus, HTN, HLP, bipolar disorder, chronic pain, depression/anxiety presented to ED with multiple complaints including loss of appetite, nausea, shortness of breath with exertion, lower extremity swelling.  Patient reported approximately 1 month of poor appetite and nausea, new onset of bilateral lower leg swelling over the past week or so.  She reported that in the last week she is having worsening exertional dyspnea and difficulty sleeping.  Denied any chest pain, abdominal pain or fevers. In ED, afebrile, O2 sats 100% on room air, BP 133/98, RR 18, pulse 95 Chest x-ray showed cardiomegaly, mildly elevated LFTs, BNP 2503 Received Lasix 40 mg IV x 1 in ED, cardiology was consulted.   Assessment & Plan    Principal Problem:   New onset of congestive heart failure (HCC), acute systolic and diastolic, biventricular -Presented with volume overload, shortness of breath, lower extremity edema, elevated BNP, cardiomegaly on chest x-ray -2D echo: EF < 20%, global hypokinesis, grade 3 DD, right ventricular systolic function moderately reduced, moderately elevated pulmonary artery systolic pressure, moderate TR. -Cardiology following, initially placed on IV Lasix, now transitioned to torsemide  - continue aldactone, farxiga -Plan for right and left cath on left Monday tomorrow, n.p.o. after midnight   Active Problems: Acute Left LE DVT in popliteal and peroneal vein: - Venous Doppler + ac DVT, started heparin gtt - f/u VQ scan - f/u hypercoag panel     Elevated  transaminase level -Likely due to #1, congestive hepatopathy.  Acute hepatitis panel non reactive.  -RUQ ultrasound showed gallbladder sludge without sonographic evidence of acute cholecystitis -Currently no abdominal pain, LFTs improving    Essential hypertension, benign -Continue losartan, Aldactone, torsemide    Chronic pain syndrome -Continue amitriptyline, gabapentin  Diet-controlled diabetes mellitus -Hemoglobin A1c 6.5 on 05/18/2022, repeat A1c -Placed on sliding scale insulin while inpatient CBG (last 3)  Recent Labs    08/27/22 2121 08/28/22 0621 08/28/22 1225  GLUCAP 150* 160* 120*   Depression with anxiety -Continue amitriptyline, hydroxyzine as needed  Estimated body mass index is 25.79 kg/m as calculated from the following:   Height as of this encounter: 5\' 5"  (1.651 m).   Weight as of this encounter: 70.3 kg.  Code Status: Full code DVT Prophylaxis:  Place TED hose Start: 08/26/22 1315   Level of Care: Level of care: Telemetry Cardiac Family Communication: Updated patient Disposition Plan:      Remains inpatient appropriate: Need cardiac workup   Procedures:  2D echo Venous Dopplers lower extremities  Consultants:   Cardiology  Antimicrobials: None  Medications  amitriptyline  75 mg Oral QHS   Chlorhexidine  Gluconate Cloth  6 each Topical Daily   dapagliflozin propanediol  10 mg Oral Daily   gabapentin  300 mg Oral BID   insulin aspart  0-5 Units Subcutaneous QHS   insulin aspart  0-9 Units Subcutaneous TID WC   pantoprazole  40 mg Oral Daily   rosuvastatin  5 mg Oral Daily   sacubitril-valsartan  1 tablet Oral BID   sodium chloride flush  10-40 mL Intracatheter Q12H   spironolactone  12.5 mg Oral Daily   [START ON 08/29/2022] torsemide  20 mg Oral Daily      Subjective:   Kendra Griffith was seen and examined today.  Still having some left calf cramping otherwise no acute chest pain or shortness of breath.  Breathing better.  No fevers.    Objective:   Vitals:   08/28/22 0459 08/28/22 0830 08/28/22 0833 08/28/22 1108  BP:   103/77 104/74  Pulse:   85 83  Resp:   18 18  Temp:  98.1 F (36.7 C)  97.9 F (36.6 C)  TempSrc:  Oral  Oral  SpO2:   100% 98%  Weight: 70.3 kg     Height:        Intake/Output Summary (Last 24 hours) at 08/28/2022 1254 Last data filed at 08/28/2022 1100 Gross per 24 hour  Intake 1269.32 ml  Output 4500 ml  Net -3230.68 ml     Wt Readings from Last 3 Encounters:  08/28/22 70.3 kg  08/17/22 75.8 kg  05/18/22 76.7 kg    Physical Exam General: Alert and oriented x 3, NAD Cardiovascular: S1 S2 clear, RRR.  JVP+ Respiratory: CTAB, no wheezing, rales or rhonchi Gastrointestinal: Soft, nontender, nondistended, NBS Ext: trace pedal edema bilaterally Neuro: no new deficits Psych: Normal affect     Data Reviewed:  I have personally reviewed following labs    CBC Lab Results  Component Value Date   WBC 5.2 08/28/2022   RBC 4.30 08/28/2022   HGB 11.6 (L) 08/28/2022   HCT 36.4 08/28/2022   MCV 84.7 08/28/2022   MCH 27.0 08/28/2022   PLT 230 08/28/2022   MCHC 31.9 08/28/2022   RDW 15.7 (H) 08/28/2022   LYMPHSABS 0.7 08/25/2022   MONOABS 0.2 08/25/2022   EOSABS 0.0 08/25/2022   BASOSABS 0.0 08/25/2022     Last metabolic panel Lab Results  Component Value Date   NA 139 08/28/2022   K 3.1 (L) 08/28/2022   CL 101 08/28/2022   CO2 25 08/28/2022   BUN 9 08/28/2022   CREATININE 1.14 (H) 08/28/2022   GLUCOSE 163 (H) 08/28/2022   GFRNONAA 56 (L) 08/28/2022   GFRAA 81 02/24/2020   CALCIUM 8.7 (L) 08/28/2022   PROT 6.0 (L) 08/28/2022   ALBUMIN 2.9 (L) 08/28/2022   LABGLOB 2.5 05/18/2022   AGRATIO 1.8 05/18/2022   BILITOT 1.0 08/28/2022   ALKPHOS 100 08/28/2022   AST 43 (H) 08/28/2022   ALT 95 (H) 08/28/2022   ANIONGAP 13 08/28/2022    CBG (last 3)  Recent Labs    08/27/22 2121 08/28/22 0621 08/28/22 1225  GLUCAP 150* 160* 120*      Coagulation Profile: No  results for input(s): "INR", "PROTIME" in the last 168 hours.   Radiology Studies: I have personally reviewed the imaging studies  VAS [place] LOWER EXTREMITY VENOUS (DVT)  Result Date: 08/28/2022  Lower Venous DVT Study Patient Name:  Kendra Griffith  Date of Exam:   08/27/2022 Medical Rec #: [id number]  Accession #:    [id number] Date of Birth: 25-Feb-1966        Patient Gender: F Patient Age:   3 years Exam Location:  [facility] Procedure:      VAS [place] LOWER EXTREMITY VENOUS (DVT) Referring Phys: Neesha Langton --------------------------------------------------------------------------------  Indications: Pain, Swelling, and SOB.  Risk Factors: EF >20%. Comparison Study: No prior study on file Performing Technologist: Sherren Kerns RVS  Examination Guidelines: A complete evaluation includes B-mode imaging, spectral Doppler, color Doppler, and power Doppler as needed of all accessible portions of each vessel. Bilateral testing is considered an integral part of a complete examination. Limited examinations for reoccurring indications may be performed as noted. The reflux portion of the exam is performed with the patient in reverse Trendelenburg.  +--------+---------------+---------+-----------+----------------+-------------+ RIGHT   CompressibilityPhasicitySpontaneityProperties      Thrombus                                                                 Aging         +--------+---------------+---------+-----------+----------------+-------------+ CFV     Full                               pulsatile                                                                waveforms                     +--------+---------------+---------+-----------+----------------+-------------+ SFJ     Full                                                             +--------+---------------+---------+-----------+----------------+-------------+ FV Prox Full                                                              +--------+---------------+---------+-----------+----------------+-------------+ FV Mid  Full                               pulsatile                                                                waveforms                     +--------+---------------+---------+-----------+----------------+-------------+ FV      Full  Distal                                                                   +--------+---------------+---------+-----------+----------------+-------------+ PFV     Full                                                             +--------+---------------+---------+-----------+----------------+-------------+ POP     Full                               pulsatile                                                                waveforms                     +--------+---------------+---------+-----------+----------------+-------------+ PTV     Full                                                             +--------+---------------+---------+-----------+----------------+-------------+ PERO    Full                                                             +--------+---------------+---------+-----------+----------------+-------------+   +--------+---------------+---------+-----------+----------------+-------------+ LEFT    CompressibilityPhasicitySpontaneityProperties      Thrombus                                                                 Aging         +--------+---------------+---------+-----------+----------------+-------------+ CFV     Full                               pulsatile                                                                waveforms                     +--------+---------------+---------+-----------+----------------+-------------+  SFJ     Full                                                              +--------+---------------+---------+-----------+----------------+-------------+ FV Prox Full                                                             +--------+---------------+---------+-----------+----------------+-------------+ FV Mid  Full                               pulsatile                                                                waveforms                     +--------+---------------+---------+-----------+----------------+-------------+ FV      Full                                                             Distal                                                                   +--------+---------------+---------+-----------+----------------+-------------+ PFV     Full                                                             +--------+---------------+---------+-----------+----------------+-------------+ POP     None                               pulsatile                                                                waveforms                     +--------+---------------+---------+-----------+----------------+-------------+ PTV     Full                                                             +--------+---------------+---------+-----------+----------------+-------------+  PERO    None                                                             +--------+---------------+---------+-----------+----------------+-------------+     Summary: RIGHT: - There is no evidence of deep vein thrombosis in the lower extremity.  pulsatile waveforms suggestive of fluid overload  LEFT: - Findings consistent with acute deep vein thrombosis involving the left popliteal vein, and left peroneal veins. Pulsatile waveforms suggestive of fluid overload.  *See table(s) above for measurements and observations. Electronically signed by Gerarda Fraction on 08/28/2022 at 8:33:45 AM.    Final        Thad Ranger M.D. [facility] 08/28/2022,  12:54 PM  Available via Epic secure chat 7am-7pm After 7 pm, please refer to night coverage provider listed on amion.

## 2022-08-28 NOTE — Progress Notes (Addendum)
Patient ID: Kendra Griffith, female   DOB: 01/08/1966, 57 y.o.   MRN: [id number]     Advanced Heart Failure Rounding Note  PCP-Cardiologist: None   Subjective:    I/Os net negative 2378, weight down 5 lbs.  Breathing better.    Heparin gtt for DVT.   Co-ox inaccurate, CVP 8.  Creatinine 0.8 => 1.3 => 1.14.    Objective:   Weight Range: 70.3 kg Body mass index is 25.79 kg/m.   Vital Signs:   Temp:  [97.6 F (36.4 C)-98.3 F (36.8 C)] 98.1 F (36.7 C) (05/19 0830) Pulse Rate:  [85-88] 85 (05/19 0833) Resp:  [16-20] 18 (05/19 0833) BP: (100-111)/(70-91) 103/77 (05/19 0833) SpO2:  [99 %-100 %] 100 % (05/19 0833) Weight:  [70.3 kg] 70.3 kg (05/19 0459) Last BM Date : 08/24/22  Weight change: Filed Weights   08/26/22 0612 08/27/22 0707 08/28/22 0459  Weight: 73.3 kg 72.5 kg 70.3 kg    Intake/Output:   Intake/Output Summary (Last 24 hours) at 08/28/2022 0957 Last data filed at 08/28/2022 0846 Gross per 24 hour  Intake 1269.32 ml  Output 3600 ml  Net -2330.68 ml      Physical Exam    General: NAD Neck: JVP 8 cm, no thyromegaly or thyroid nodule.  Lungs: Clear to auscultation bilaterally with normal respiratory effort. CV: Nondisplaced PMI.  Heart regular S1/S2, no S3/S4, no murmur.  Trace ankle Abdomen: Soft, nontender, no hepatosplenomegaly, no distention.  Skin: Intact without lesions or rashes.  Neurologic: Alert and oriented x 3.  Psych: Normal affect. Extremities: No clubbing or cyanosis.  HEENT: Normal.   Telemetry   NSR (personally reviewed)  Labs    CBC Recent Labs    08/25/22 1651 08/26/22 0043 08/27/22 0735 08/28/22 0412  WBC 3.8*   < > 5.5 5.2  NEUTROABS 2.8  --   --   --   HGB 11.3*   < > 11.0* 11.6*  HCT 37.8   < > 35.1* 36.4  MCV 89.4   < > 84.6 84.7  PLT 198   < > 202 230   < > = values in this interval not displayed.   Basic Metabolic Panel Recent Labs    40/98/11 0645 08/28/22 0412  NA 141 139  K 3.8 3.1*  CL 103 101   CO2 25 25  GLUCOSE 120* 163*  BUN 12 9  CREATININE 1.34* 1.14*  CALCIUM 9.3 8.7*  MG 1.6*  --    Liver Function Tests Recent Labs    08/27/22 0645 08/28/22 0412  AST 56* 43*  ALT 119* 95*  ALKPHOS 86 100  BILITOT 0.8 1.0  PROT 6.4* 6.0*  ALBUMIN 3.0* 2.9*   Recent Labs    08/25/22 1651  LIPASE 34   Cardiac Enzymes No results for input(s): "CKTOTAL", "CKMB", "CKMBINDEX", "TROPONINI" in the last 72 hours.  BNP: BNP (last 3 results) Recent Labs    08/25/22 2230  BNP 2,502.5*    ProBNP (last 3 results) No results for input(s): "PROBNP" in the last 8760 hours.   D-Dimer No results for input(s): "DDIMER" in the last 72 hours. Hemoglobin A1C Recent Labs    08/27/22 0735  HGBA1C 6.1*   Fasting Lipid Panel Recent Labs    08/27/22 0645  CHOL 77  HDL 20*  LDLCALC 42  TRIG 75  CHOLHDL 3.9   Thyroid Function Tests Recent Labs    08/27/22 0500  TSH 1.486    Other results:   Imaging  No results found.   Medications:     Scheduled Medications:  amitriptyline  75 mg Oral QHS   Chlorhexidine Gluconate Cloth  6 each Topical Daily   dapagliflozin propanediol  10 mg Oral Daily   gabapentin  300 mg Oral BID   insulin aspart  0-5 Units Subcutaneous QHS   insulin aspart  0-9 Units Subcutaneous TID WC   pantoprazole  40 mg Oral Daily   potassium chloride  40 mEq Oral Once   rosuvastatin  5 mg Oral Daily   sacubitril-valsartan  1 tablet Oral BID   sodium chloride flush  10-40 mL Intracatheter Q12H   spironolactone  12.5 mg Oral Daily   [START ON 08/29/2022] torsemide  20 mg Oral Daily    Infusions:  ferric gluconate (FERRLECIT) IVPB 135 mL/hr at 08/27/22 1446   heparin 1,000 Units/hr (08/28/22 0846)    PRN Medications: acetaminophen **OR** acetaminophen, albuterol, fentaNYL (SUBLIMAZE) injection, hydrOXYzine, polyethylene glycol, sodium chloride flush    Assessment/Plan   1.  Acute on chronic systolic CHF:  Echo 5/24 EF <20%, GIIIDD, RV  mod reduced, mod elevated PASP ~58, LA mod dilated, RA mildly dilated, mild MR, mod TR. NYHA III-IV at admission,  previous hx heavy alcohol use but quit a number of years ago. HIV negative.  No recent viral-type illness.  She does have CAD risk factors with family history, heavy smoking, diabetes. Not ACS-like presentation.  CVP 8 today, diuresed well with IV Lasix yesterday and weight down. Co-ox not accurate.  Creatinine stable at 1.1.  - Stop Lasix IV after dose this morning, start torsemide 20 mg daily tomorrow.   - Continue Spironolactone 12.5 daily.  - Continue Farxiga 10 daily - Start Entresto 24/26 bid today.  - Will need [facility] Monday, if not revealing will need cMRI. Discussed risks/benefits of cath and she agreed to procedure.  2.  HTN: BP controlled.  3.  Elevated LFTs: Trending down.  Suspect hepatic congestion with CHF.  DMII: Hgb A1c 6.5 2/24 - per primary 5. Smoker: Currently heavy smoker, 2 PPD. Has smoked since she was 11. - cessation heavily encouraged 6. DVT: Left leg DVT found.  Had DVT back in 1980s found after she delivered her son.   - Will check factor V Leiden, prothrombin gene mutation, and antiphospholipid antibody syndrome workup.  - V/Q scan to assess for PE. - Continue heparin gtt.  7. Fe deficiency anemia: given IV Fe.  Check hemoccult.   Length of Stay: 2  Marca Ancona, MD  08/28/2022, 9:57 AM  Advanced Heart Failure Team Pager [phone] (M-F; 7a - 5p)  Please contact [facility] for night-coverage after hours (5p -7a ) and weekends on [email or web address]

## 2022-08-28 NOTE — Progress Notes (Signed)
ANTICOAGULATION CONSULT NOTE  Pharmacy Consult for heparin Indication: DVT  Allergies  Allergen Reactions   Amoxicillin     Pain Did it involve swelling of the face/tongue/throat, SOB, or low BP? No Did it involve sudden or severe rash/hives, skin peeling, or any reaction on the inside of your mouth or nose? No Did you need to seek medical attention at a hospital or doctor's office? Yes When did it last happen?      5 years ago If all above answers are "NO", may proceed with cephalosporin use.    Darvocet [Propoxyphene N-Acetaminophen] Nausea And Vomiting    Makes her jittery   Hydrocodone-Acetaminophen Nausea And Vomiting    Upset stomach   Percocet [Oxycodone-Acetaminophen]     Makes her jittery   Valium [Diazepam] Other (See Comments)    Makes pt. Feel "out of wack"    Patient Measurements: Height: 5\' 5"  (165.1 cm) Weight: 70.3 kg (154 lb 15.7 oz) IBW/kg (Calculated) : 57 Heparin Dosing Weight: 73 kg  Vital Signs: Temp: 98.3 F (36.8 C) (05/19 0321) Temp Source: Oral (05/19 0321) BP: 106/78 (05/19 0321) Pulse Rate: 88 (05/19 0321)  Labs: Recent Labs    08/26/22 0043 08/26/22 1628 08/27/22 0645 08/27/22 0735 08/27/22 1914 08/28/22 0412  HGB 11.4*  --  SPECIMEN CLOTTED 11.0*  --  11.6*  HCT 36.2  --  SPECIMEN CLOTTED 35.1*  --  36.4  PLT 195  --  SPECIMEN CLOTTED 202  --  230  HEPARINUNFRC  --   --   --   --  >1.10* 0.54  CREATININE 0.80  --  1.34*  --   --  1.14*  TROPONINIHS  --  12  --   --   --   --      Estimated Creatinine Clearance: 54.2 mL/min (A) (by C-G formula based on SCr of 1.14 mg/dL (H)).  Assessment: 57 YO female presenting 5/16 with complaints of poor appetite, dyspnea on exertion, cough, difficulty sleeping, found to be new onset CHF. Bilateral lower extremity duplex positive for DVT. Patient not on anticoagulation PTA but did receive one dose of Lovenox 40 mg (DVT ppx) 5/17 @0953 . Pharmacy has been consulted for heparin dosing.    Heparin level is therapeutic at 0.54 on 1000 units/hr. No bleeding noted, CBC is stable.  Goal of Therapy:  Heparin level 0.3-0.7 units/ml Monitor platelets by anticoagulation protocol: Yes   Plan:  Continue heparin infusion at 1000 units/hr 6 hr confirmatory heparin level  Daily heparin level and CBC Monitor for signs/symptoms of bleeding  Thank you for involving pharmacy in this patient's care.  Loura Back, PharmD, BCPS Clinical Pharmacist 08/28/2022 5:06 AM

## 2022-08-28 NOTE — Progress Notes (Signed)
ANTICOAGULATION CONSULT NOTE - Initial Consult  Pharmacy Consult for heparin Indication: DVT  Allergies  Allergen Reactions   Amoxicillin     Pain Did it involve swelling of the face/tongue/throat, SOB, or low BP? No Did it involve sudden or severe rash/hives, skin peeling, or any reaction on the inside of your mouth or nose? No Did you need to seek medical attention at a hospital or doctor's office? Yes When did it last happen?      5 years ago If all above answers are "NO", may proceed with cephalosporin use.    Darvocet [Propoxyphene N-Acetaminophen] Nausea And Vomiting    Makes her jittery   Hydrocodone-Acetaminophen Nausea And Vomiting    Upset stomach   Percocet [Oxycodone-Acetaminophen]     Makes her jittery   Valium [Diazepam] Other (See Comments)    Makes pt. Feel "out of wack"    Patient Measurements: Height: 5\' 5"  (165.1 cm) Weight: 70.3 kg (154 lb 15.7 oz) IBW/kg (Calculated) : 57 Heparin Dosing Weight: 72.6 kg  Vital Signs: Temp: 97.9 F (36.6 C) (05/19 1108) Temp Source: Oral (05/19 1108) BP: 104/74 (05/19 1108) Pulse Rate: 83 (05/19 1108)  Labs: Recent Labs    08/26/22 0043 08/26/22 1628 08/27/22 0645 08/27/22 0735 08/27/22 1914 08/28/22 0412 08/28/22 0950  HGB 11.4*  --  SPECIMEN CLOTTED 11.0*  --  11.6*  --   HCT 36.2  --  SPECIMEN CLOTTED 35.1*  --  36.4  --   PLT 195  --  SPECIMEN CLOTTED 202  --  230  --   HEPARINUNFRC  --   --   --   --  >1.10* 0.54 0.64  CREATININE 0.80  --  1.34*  --   --  1.14*  --   TROPONINIHS  --  12  --   --   --   --   --      Estimated Creatinine Clearance: 54.2 mL/min (A) (by C-G formula based on SCr of 1.14 mg/dL (H)).   Medical History: Past Medical History:  Diagnosis Date   ABSCESS, TOOTH 08/20/2009   Qualifier: Diagnosis of  By: Huntley Dec, Scott     Anemia    Anxiety    Bipolar disorder (HCC)    Blood transfusion without reported diagnosis    childbirth   Chronic pain    feet and back    Clotting disorder (HCC)    childbirth   Dental caries    lesion soft palate   Depression    Diabetes mellitus without complication (HCC)    prediabetes   GERD (gastroesophageal reflux disease)    Hypertension    Injury    right side from jumping off bunkbed   Pre-diabetes    Sickle cell trait (HCC)    Wears glasses     Medications:  Medications Prior to Admission  Medication Sig Dispense Refill Last Dose   acetaminophen-codeine (TYLENOL #3) 300-30 MG tablet Take 1-2 tablets by mouth every 4 (four) hours as needed for moderate pain. 60 tablet 0 08/25/2022   albuterol (VENTOLIN HFA) 108 (90 Base) MCG/ACT inhaler Inhale 2 puffs into the lungs every 6 (six) hours as needed for wheezing or shortness of breath. 18 g 0 08/26/2022   amitriptyline (ELAVIL) 75 MG tablet Take 1 tablet (75 mg total) by mouth at bedtime for pain and depression 90 tablet 1 08/25/2022   Biotin 5 MG CAPS Take 5 mg by mouth 3 (three) times daily.   08/26/2022   cholecalciferol (  VITAMIN D3) 25 MCG (1000 UT) tablet Take 1,000 Units by mouth daily.   Past Week   famotidine (PEPCID) 20 MG tablet Take 1 tablet (20 mg total) by mouth 2 (two) times daily for heartburn 180 tablet 1 08/25/2022   gabapentin (NEURONTIN) 300 MG capsule Take 1 capsule (300 mg total) by mouth 2 (two) times daily. 180 capsule 3 08/26/2022   GLUTAMINE PO Take 500 mg by mouth daily.   08/26/2022   hydrOXYzine (ATARAX) 25 MG tablet Take 1 tablet (25 mg total) by mouth 3 (three) times daily as needed. (Patient taking differently: Take 25 mg by mouth 3 (three) times daily as needed for anxiety.) 90 tablet 1 Past Week   ibuprofen (ADVIL) 800 MG tablet Take 1 tablet (800 mg total) by mouth every 8 (eight) hours as needed. (Patient taking differently: Take 800 mg by mouth every 8 (eight) hours as needed for mild pain.) 60 tablet 1 Past Week   losartan (COZAAR) 25 MG tablet Take 1 tablet (25 mg total) by mouth daily for blood pressure (Patient taking differently: Take  25 mg by mouth every evening.) 90 tablet 1 Past Week   omeprazole (PRILOSEC) 20 MG capsule Take 1 capsule (20 mg total) by mouth daily for heartburn 90 capsule 1 08/26/2022   rosuvastatin (CRESTOR) 5 MG tablet Take 1 tablet (5 mg total) by mouth daily for cholesterol 90 tablet 1 08/26/2022   vitamin B-12 (CYANOCOBALAMIN) 500 MCG tablet Take 500 mcg by mouth daily.   08/26/2022   vitamin C (ASCORBIC ACID) 500 MG tablet Take 500 mg by mouth daily.   08/26/2022   chlorhexidine (HIBICLENS) 4 % external liquid Apply topically daily as needed. Use when bathing or showering (Patient not taking: Reported on 08/26/2022) 946 mL 0 Not Taking   gentamicin (GARAMYCIN) 0.3 % ophthalmic solution Place 1 drop into the left eye 4 (four) times daily. (Patient not taking: Reported on 08/26/2022) 5 mL 0 Completed Course   hydroquinone 4 % cream Apply topically 2 (two) times daily. (Patient not taking: Reported on 08/26/2022)   Not Taking   Misc. Devices MISC Please provide patient with incontinence pads or disposable underwear or choice. N39.3 1 each PRN    nicotine (NICODERM CQ - DOSED IN MG/24 HOURS) 21 mg/24hr patch Place 1 patch (21 mg total) onto the skin daily. (Patient not taking: Reported on 08/26/2022) 42 patch 0 Not Taking   nicotine polacrilex (NICOTINE MINI) 4 MG lozenge Dissolve 1 lozenge (4 mg total) by mouth as needed for smoking cessation. (Patient not taking: Reported on 08/26/2022) 100 tablet 1 Not Taking   NONFORMULARY OR COMPOUNDED ITEM Peripheral Neuropathy Cream: Bupivacaine 1%, Doxepin 3%, Gabapentin 6%, Pentoxifylline 3%, Topiramate 1% Order faxed to [place] (Patient not taking: Reported on 08/26/2022) 1 each 3 Not Taking   Scheduled:   amitriptyline  75 mg Oral QHS   Chlorhexidine Gluconate Cloth  6 each Topical Daily   dapagliflozin propanediol  10 mg Oral Daily   gabapentin  300 mg Oral BID   insulin aspart  0-5 Units Subcutaneous QHS   insulin aspart  0-9 Units Subcutaneous TID WC    pantoprazole  40 mg Oral Daily   rosuvastatin  5 mg Oral Daily   sacubitril-valsartan  1 tablet Oral BID   sodium chloride flush  10-40 mL Intracatheter Q12H   spironolactone  12.5 mg Oral Daily   [START ON 08/29/2022] torsemide  20 mg Oral Daily    Assessment: 57 YO female presenting  5/16 with complaints of poor appetite, dyspnea on exertion, cough, difficulty sleeping, found to be new onset CHF. Bilateral lower extremity duplex positive for DVT. Patient not on anticoagulation PTA but did receive one dose of Lovenox 40 mg (DVT ppx) 5/17 @0953 . Planning for [facility] on Monday, 5/20. Pharmacy has been consulted for heparin dosing.   Heparin level today is therapeutic at 0.64, on 1000 units/hr. Hgb 11.6, plt 230--stable. No line issues or signs/symptoms of bleeding reported.  Goal of Therapy:  Heparin level 0.3-0.7 units/ml Monitor platelets by anticoagulation protocol: Yes   Plan:  Continue heparin gtt @ 1000 units/hr  Monitor CBC, heparin level, and s/sx of bleeding daily  Planning for [facility] on Monday    Cherylin Mylar, PharmD PGY1 Pharmacy Resident 5/19/202411:25 AM

## 2022-08-29 ENCOUNTER — Inpatient Hospital Stay (HOSPITAL_COMMUNITY): Payer: Medicaid Other

## 2022-08-29 ENCOUNTER — Encounter (HOSPITAL_COMMUNITY)
Admission: EM | Disposition: A | Discharge status: Home / Self Care | Payer: Self-pay | Source: Home / Self Care | Attending: Internal Medicine

## 2022-08-29 ENCOUNTER — Other Ambulatory Visit (HOSPITAL_COMMUNITY): Payer: Self-pay

## 2022-08-29 ENCOUNTER — Telehealth (HOSPITAL_COMMUNITY): Payer: Self-pay

## 2022-08-29 ENCOUNTER — Ambulatory Visit (HOSPITAL_COMMUNITY): Admission: RE | Admit: 2022-08-29 | Payer: Medicaid Other | Source: Home / Self Care | Admitting: Internal Medicine

## 2022-08-29 DIAGNOSIS — I82432 Acute embolism and thrombosis of left popliteal vein: Secondary | ICD-10-CM | POA: Diagnosis not present

## 2022-08-29 DIAGNOSIS — I5021 Acute systolic (congestive) heart failure: Secondary | ICD-10-CM

## 2022-08-29 DIAGNOSIS — I509 Heart failure, unspecified: Secondary | ICD-10-CM | POA: Diagnosis not present

## 2022-08-29 DIAGNOSIS — G894 Chronic pain syndrome: Secondary | ICD-10-CM | POA: Diagnosis not present

## 2022-08-29 DIAGNOSIS — R0602 Shortness of breath: Secondary | ICD-10-CM | POA: Diagnosis not present

## 2022-08-29 HISTORY — PX: RIGHT/LEFT HEART CATH AND CORONARY ANGIOGRAPHY: CATH118266

## 2022-08-29 LAB — POCT I-STAT EG7
Acid-Base Excess: 3 mmol/L — ABNORMAL HIGH (ref 0.0–2.0)
Acid-Base Excess: 4 mmol/L — ABNORMAL HIGH (ref 0.0–2.0)
Bicarbonate: 28 mmol/L (ref 20.0–28.0)
Bicarbonate: 28.5 mmol/L — ABNORMAL HIGH (ref 20.0–28.0)
Calcium, Ion: 1.17 mmol/L (ref 1.15–1.40)
Calcium, Ion: 1.2 mmol/L (ref 1.15–1.40)
HCT: 46 % (ref 36.0–46.0)
HCT: 46 % (ref 36.0–46.0)
Hemoglobin: 15.6 g/dL — ABNORMAL HIGH (ref 12.0–15.0)
Hemoglobin: 15.6 g/dL — ABNORMAL HIGH (ref 12.0–15.0)
O2 Saturation: 54 %
O2 Saturation: 57 %
Potassium: 3.8 mmol/L (ref 3.5–5.1)
Potassium: 3.9 mmol/L (ref 3.5–5.1)
Sodium: 142 mmol/L (ref 135–145)
Sodium: 143 mmol/L (ref 135–145)
TCO2: 29 mmol/L (ref 22–32)
TCO2: 30 mmol/L (ref 22–32)
pCO2, Ven: 42.8 mmHg — ABNORMAL LOW (ref 44–60)
pCO2, Ven: 43.1 mmHg — ABNORMAL LOW (ref 44–60)
pH, Ven: 7.424 (ref 7.25–7.43)
pH, Ven: 7.428 (ref 7.25–7.43)
pO2, Ven: 28 mmHg — CL (ref 32–45)
pO2, Ven: 29 mmHg — CL (ref 32–45)

## 2022-08-29 LAB — POCT I-STAT 7, (LYTES, BLD GAS, ICA,H+H)
Acid-Base Excess: 0 mmol/L (ref 0.0–2.0)
Bicarbonate: 23 mmol/L (ref 20.0–28.0)
Calcium, Ion: 0.97 mmol/L — ABNORMAL LOW (ref 1.15–1.40)
HCT: 42 % (ref 36.0–46.0)
Hemoglobin: 14.3 g/dL (ref 12.0–15.0)
O2 Saturation: 93 %
Potassium: 3.4 mmol/L — ABNORMAL LOW (ref 3.5–5.1)
Sodium: 146 mmol/L — ABNORMAL HIGH (ref 135–145)
TCO2: 24 mmol/L (ref 22–32)
pCO2 arterial: 32.8 mmHg (ref 32–48)
pH, Arterial: 7.453 — ABNORMAL HIGH (ref 7.35–7.45)
pO2, Arterial: 63 mmHg — ABNORMAL LOW (ref 83–108)

## 2022-08-29 LAB — COMPREHENSIVE METABOLIC PANEL
ALT: 81 U/L — ABNORMAL HIGH (ref 0–44)
AST: 35 U/L (ref 15–41)
Albumin: 3 g/dL — ABNORMAL LOW (ref 3.5–5.0)
Alkaline Phosphatase: 110 U/L (ref 38–126)
Anion gap: 11 (ref 5–15)
BUN: 10 mg/dL (ref 6–20)
CO2: 24 mmol/L (ref 22–32)
Calcium: 8.7 mg/dL — ABNORMAL LOW (ref 8.9–10.3)
Chloride: 100 mmol/L (ref 98–111)
Creatinine, Ser: 1.13 mg/dL — ABNORMAL HIGH (ref 0.44–1.00)
GFR, Estimated: 57 mL/min — ABNORMAL LOW (ref >60)
Glucose, Bld: 134 mg/dL — ABNORMAL HIGH (ref 70–99)
Potassium: 3.3 mmol/L — ABNORMAL LOW (ref 3.5–5.1)
Sodium: 135 mmol/L (ref 135–145)
Total Bilirubin: 0.9 mg/dL (ref 0.3–1.2)
Total Protein: 6.3 g/dL — ABNORMAL LOW (ref 6.5–8.1)

## 2022-08-29 LAB — GLUCOSE, CAPILLARY
Glucose-Capillary: 124 mg/dL — ABNORMAL HIGH (ref 70–99)
Glucose-Capillary: 122 mg/dL — ABNORMAL HIGH (ref 70–99)
Glucose-Capillary: 125 mg/dL — ABNORMAL HIGH (ref 70–99)
Glucose-Capillary: 153 mg/dL — ABNORMAL HIGH (ref 70–99)

## 2022-08-29 LAB — URINALYSIS, ROUTINE W REFLEX MICROSCOPIC
Bacteria, UA: NONE SEEN
Bilirubin Urine: NEGATIVE
Glucose, UA: >=500 mg/dL — AB
Hgb urine dipstick: NEGATIVE
Ketones, ur: NEGATIVE mg/dL
Leukocytes,Ua: NEGATIVE
Nitrite: NEGATIVE
Protein, ur: NEGATIVE mg/dL
Specific Gravity, Urine: 1.007 (ref 1.005–1.030)
pH: 6 (ref 5.0–8.0)

## 2022-08-29 LAB — CBC
HCT: 39.3 % (ref 36.0–46.0)
Hemoglobin: 12.4 g/dL (ref 12.0–15.0)
MCH: 26.5 pg (ref 26.0–34.0)
MCHC: 31.6 g/dL (ref 30.0–36.0)
MCV: 84 fL (ref 80.0–100.0)
Platelets: 236 10*3/uL (ref 150–400)
RBC: 4.68 MIL/uL (ref 3.87–5.11)
RDW: 15.8 % — ABNORMAL HIGH (ref 11.5–15.5)
WBC: 5.6 10*3/uL (ref 4.0–10.5)
nRBC: 3.7 % — ABNORMAL HIGH (ref 0.0–0.2)

## 2022-08-29 LAB — COOXEMETRY PANEL
Carboxyhemoglobin: 1.8 % — ABNORMAL HIGH (ref 0.5–1.5)
Methemoglobin: <0.7 % (ref 0.0–1.5)
O2 Saturation: 60.2 %
Total hemoglobin: 13.1 g/dL (ref 12.0–16.0)

## 2022-08-29 LAB — HEPARIN LEVEL (UNFRACTIONATED): Heparin Unfractionated: 0.71 IU/mL — ABNORMAL HIGH (ref 0.30–0.70)

## 2022-08-29 SURGERY — RIGHT/LEFT HEART CATH AND CORONARY ANGIOGRAPHY
Anesthesia: LOCAL

## 2022-08-29 MED ORDER — SODIUM CHLORIDE 0.9% FLUSH
3.0000 mL | Freq: Two times a day (BID) | INTRAVENOUS | Status: DC
  Administered 2022-08-29 – 2022-08-31 (×4): 3 mL via INTRAVENOUS

## 2022-08-29 MED ORDER — HEPARIN SODIUM (PORCINE) 1000 UNIT/ML IJ SOLN
INTRAMUSCULAR | Status: AC
  Filled 2022-08-29: qty 10

## 2022-08-29 MED ORDER — SODIUM CHLORIDE 0.9 % IV SOLN: 250.0000 mL | INTRAVENOUS | Status: DC | PRN

## 2022-08-29 MED ORDER — MIDAZOLAM HCL 2 MG/2ML IJ SOLN
INTRAMUSCULAR | Status: DC | PRN
  Administered 2022-08-29: 1 mg via INTRAVENOUS

## 2022-08-29 MED ORDER — TECHNETIUM TO 99M ALBUMIN AGGREGATED
3.8000 | Freq: Once | INTRAVENOUS | Status: AC | PRN
  Administered 2022-08-29: 3.8 via INTRAVENOUS

## 2022-08-29 MED ORDER — APIXABAN 5 MG PO TABS: 5.0000 mg | ORAL_TABLET | Freq: Two times a day (BID) | ORAL | Status: DC

## 2022-08-29 MED ORDER — HEPARIN SODIUM (PORCINE) 1000 UNIT/ML IJ SOLN
INTRAMUSCULAR | Status: DC | PRN
  Administered 2022-08-29: 3500 [IU] via INTRAVENOUS

## 2022-08-29 MED ORDER — VERAPAMIL HCL 2.5 MG/ML IV SOLN
INTRAVENOUS | Status: AC
  Filled 2022-08-29: qty 2

## 2022-08-29 MED ORDER — SODIUM CHLORIDE 0.9% FLUSH
3.0000 mL | INTRAVENOUS | Status: DC | PRN
  Administered 2022-08-30 (×2): 3 mL via INTRAVENOUS

## 2022-08-29 MED ORDER — MIDAZOLAM HCL 2 MG/2ML IJ SOLN
INTRAMUSCULAR | Status: AC
  Filled 2022-08-29: qty 2

## 2022-08-29 MED ORDER — FUROSEMIDE 10 MG/ML IJ SOLN
80.0000 mg | Freq: Two times a day (BID) | INTRAMUSCULAR | Status: DC
  Administered 2022-08-29 – 2022-08-30 (×2): 80 mg via INTRAVENOUS
  Filled 2022-08-29 (×2): qty 8

## 2022-08-29 MED ORDER — SPIRONOLACTONE 25 MG PO TABS
25.0000 mg | ORAL_TABLET | Freq: Every day | ORAL | Status: DC
  Administered 2022-08-30 – 2022-08-31 (×2): 25 mg via ORAL
  Filled 2022-08-29 (×2): qty 1

## 2022-08-29 MED ORDER — IOHEXOL 350 MG/ML SOLN
INTRAVENOUS | Status: DC | PRN
  Administered 2022-08-29: 30 mL via INTRA_ARTERIAL

## 2022-08-29 MED ORDER — POTASSIUM CHLORIDE CRYS ER 20 MEQ PO TBCR
40.0000 meq | EXTENDED_RELEASE_TABLET | Freq: Once | ORAL | Status: AC
  Administered 2022-08-29: 40 meq via ORAL
  Filled 2022-08-29: qty 2

## 2022-08-29 MED ORDER — LIDOCAINE HCL (PF) 1 % IJ SOLN
INTRAMUSCULAR | Status: DC | PRN
  Administered 2022-08-29 (×2): 2 mL

## 2022-08-29 MED ORDER — HEPARIN (PORCINE) IN NACL 1000-0.9 UT/500ML-% IV SOLN
INTRAVENOUS | Status: DC | PRN
  Administered 2022-08-29 (×2): 500 mL

## 2022-08-29 MED ORDER — ONDANSETRON HCL 4 MG/2ML IJ SOLN: 4.0000 mg | Freq: Four times a day (QID) | INTRAMUSCULAR | Status: DC | PRN

## 2022-08-29 MED ORDER — LIDOCAINE HCL (PF) 1 % IJ SOLN
INTRAMUSCULAR | Status: AC
  Filled 2022-08-29: qty 30

## 2022-08-29 MED ORDER — FENTANYL CITRATE (PF) 100 MCG/2ML IJ SOLN
INTRAMUSCULAR | Status: AC
  Filled 2022-08-29: qty 2

## 2022-08-29 MED ORDER — APIXABAN 5 MG PO TABS
10.0000 mg | ORAL_TABLET | Freq: Two times a day (BID) | ORAL | Status: DC
  Administered 2022-08-29 – 2022-08-31 (×4): 10 mg via ORAL
  Filled 2022-08-29 (×4): qty 2

## 2022-08-29 MED ORDER — VERAPAMIL HCL 2.5 MG/ML IV SOLN
INTRAVENOUS | Status: DC | PRN
  Administered 2022-08-29: 10 mL via INTRA_ARTERIAL

## 2022-08-29 MED ORDER — HYDRALAZINE HCL 20 MG/ML IJ SOLN: 10.0000 mg | INTRAMUSCULAR | Status: AC | PRN

## 2022-08-29 MED ORDER — POTASSIUM CHLORIDE CRYS ER 20 MEQ PO TBCR: 40.0000 meq | EXTENDED_RELEASE_TABLET | Freq: Once | ORAL | Status: DC

## 2022-08-29 MED ORDER — LABETALOL HCL 5 MG/ML IV SOLN: 10.0000 mg | INTRAVENOUS | Status: AC | PRN

## 2022-08-29 MED ORDER — ACETAMINOPHEN 325 MG PO TABS: 650.0000 mg | ORAL_TABLET | ORAL | Status: DC | PRN

## 2022-08-29 MED ORDER — DIGOXIN 125 MCG PO TABS
0.1250 mg | ORAL_TABLET | Freq: Every day | ORAL | Status: DC
  Administered 2022-08-29 – 2022-08-31 (×3): 0.125 mg via ORAL
  Filled 2022-08-29 (×3): qty 1

## 2022-08-29 MED ORDER — FENTANYL CITRATE (PF) 100 MCG/2ML IJ SOLN
INTRAMUSCULAR | Status: DC | PRN
  Administered 2022-08-29: 25 ug via INTRAVENOUS

## 2022-08-29 SURGICAL SUPPLY — 9 items
CATH 5FR JL3.5 JR4 ANG PIG MP (CATHETERS) IMPLANT
CATH BALLN WEDGE 5F 110CM (CATHETERS) IMPLANT
DEVICE RAD TR BAND REGULAR (VASCULAR PRODUCTS) IMPLANT
GLIDESHEATH SLEND SS 6F .021 (SHEATH) IMPLANT
GUIDEWIRE INQWIRE 1.5J.035X260 (WIRE) IMPLANT
INQWIRE 1.5J .035X260CM (WIRE) ×1
PACK CARDIAC CATHETERIZATION (CUSTOM PROCEDURE TRAY) ×1 IMPLANT
SHEATH GLIDE SLENDER 4/5FR (SHEATH) IMPLANT
TRANSDUCER W/STOPCOCK (MISCELLANEOUS) ×1 IMPLANT

## 2022-08-29 NOTE — Progress Notes (Signed)
Pt came back to rm 11 from cath lab. Reinitiated tele. Obtained vs. Call bell within reach.   Lawson Radar, RN

## 2022-08-29 NOTE — Interval H&P Note (Signed)
History and Physical Interval Note:  08/29/2022 10:26 AM  Kendra Griffith  has presented today for surgery, with the diagnosis of heart failure.  The various methods of treatment have been discussed with the patient and family. After consideration of risks, benefits and other options for treatment, the patient has consented to  Procedure(s): RIGHT/LEFT HEART CATH AND CORONARY ANGIOGRAPHY (N/A) as a surgical intervention.  The patient's history has been reviewed, patient examined, no change in status, stable for surgery.  I have reviewed the patient's chart and labs.  Questions were answered to the patient's satisfaction.     Robinn Overholt

## 2022-08-29 NOTE — Progress Notes (Addendum)
ANTICOAGULATION CONSULT NOTE  Pharmacy Consult for heparin > Eliquis Indication: DVT  Allergies  Allergen Reactions   Amoxicillin     Pain Did it involve swelling of the face/tongue/throat, SOB, or low BP? No Did it involve sudden or severe rash/hives, skin peeling, or any reaction on the inside of your mouth or nose? No Did you need to seek medical attention at a hospital or doctor's office? Yes When did it last happen?      5 years ago If all above answers are "NO", may proceed with cephalosporin use.    Darvocet [Propoxyphene N-Acetaminophen] Nausea And Vomiting    Makes her jittery   Hydrocodone-Acetaminophen Nausea And Vomiting    Upset stomach   Percocet [Oxycodone-Acetaminophen]     Makes her jittery   Valium [Diazepam] Other (See Comments)    Makes pt. Feel "out of wack"    Patient Measurements: Height: 5\' 5"  (165.1 cm) Weight: 68 kg (149 lb 14.4 oz) IBW/kg (Calculated) : 57 Heparin Dosing Weight: 73 kg  Vital Signs: Temp: 97.6 F (36.4 C) (05/20 0833) Temp Source: Oral (05/20 0833) BP: 103/79 (05/20 0833) Pulse Rate: 91 (05/20 0833)  Labs: Recent Labs    08/26/22 1628 08/27/22 0645 08/27/22 0645 08/27/22 0735 08/27/22 1914 08/28/22 0412 08/28/22 0950 08/29/22 0216  HGB  --  SPECIMEN CLOTTED   < > 11.0*  --  11.6*  --  12.4  HCT  --  SPECIMEN CLOTTED   < > 35.1*  --  36.4  --  39.3  PLT  --  SPECIMEN CLOTTED   < > 202  --  230  --  236  HEPARINUNFRC  --   --   --   --    < > 0.54 0.64 0.71*  CREATININE  --  1.34*  --   --   --  1.14*  --  1.13*  TROPONINIHS 12  --   --   --   --   --   --   --    < > = values in this interval not displayed.     Estimated Creatinine Clearance: 50 mL/min (A) (by C-G formula based on SCr of 1.13 mg/dL (H)).  Assessment: 57 YO female presenting 5/16 with complaints of poor appetite, dyspnea on exertion, cough, difficulty sleeping, found to be new onset CHF. Bilateral lower extremity duplex positive for DVT. Patient  not on anticoagulation PTA but did receive one dose of Lovenox 40 mg (DVT ppx) 5/17 @0953 . Pharmacy has been consulted for heparin dosing.   Heparin level is slightly above goal at 0.71. No bleeding noted, CBC is stable.  Goal of Therapy:  Heparin level 0.3-0.7 units/ml Monitor platelets by anticoagulation protocol: Yes   Plan:  Decrease IV heparin to 950 units/hr. Daily heparin level and CBC Monitor for signs/symptoms of bleeding  Thank you for involving pharmacy in this patient's care.  Reece Leader, Colon Flattery, BCCP Clinical Pharmacist  08/29/2022 10:02 AM   [facility] pharmacy phone numbers are listed on [email or web address]  Addendum: Heparin changed to Eliquis s/p cath.  Will give 10 mg BID x 7 days then 5 mg BID to start tonight at 6 pm.  Reece Leader, Loura Back, BCPS, [facility] Clinical Pharmacist  08/29/2022 1:20 PM   [facility] pharmacy phone numbers are listed on [email or web address]

## 2022-08-29 NOTE — H&P (View-Only) (Signed)
Patient ID: Kendra Griffith, female   DOB: 12/23/1965, 57 y.o.   MRN: [id number]     Advanced Heart Failure Rounding Note  PCP-Cardiologist: Dr. McLean (new)  Subjective:    Wt down 18 lb w/ diuresis. Transitioned to PO diuretics yesterday.  Co-ox 60%. CVP 9. Awaiting R/LHC today   K 3.3   LE venous doppler + for LLE DVT, on heparin gtt. V/Q scan ordered.   Feels ok this morning. Denies resting dyspnea. No CP. No leg pain. Complains of mild pelvic pressure. Thinks she may have a UTI but no dysuria. AF. WBC WNL. UA has been ordered.   Objective:   Weight Range: 68 kg Body mass index is 24.94 kg/m.   Vital Signs:   Temp:  [97.6 F (36.4 C)-98.5 F (36.9 C)] 97.6 F (36.4 C) (05/20 0833) Pulse Rate:  [83-100] 91 (05/20 0833) Resp:  [16-20] 19 (05/20 0833) BP: (98-109)/(70-81) 103/79 (05/20 0833) SpO2:  [94 %-100 %] 94 % (05/20 0833) Weight:  [68 kg] 68 kg (05/20 0617) Last BM Date : 08/24/22  Weight change: Filed Weights   08/27/22 0707 08/28/22 0459 08/29/22 0617  Weight: 72.5 kg 70.3 kg 68 kg    Intake/Output:   Intake/Output Summary (Last 24 hours) at 08/29/2022 0916 Last data filed at 08/29/2022 0600 Gross per 24 hour  Intake 469.53 ml  Output 2750 ml  Net -2280.47 ml      Physical Exam    CVP 9 General:  Well appearing. No respiratory difficulty HEENT: normal Neck: supple. JVD 8 cm. Carotids 2+ bilat; no bruits. No lymphadenopathy or thyromegaly appreciated. Cor: PMI nondisplaced. Regular rate & rhythm. No rubs, gallops or murmurs. Lungs: decreased BS at the bases bilaterally, no wheezing  Abdomen: soft, nontender, nondistended. No hepatosplenomegaly. No bruits or masses. Good bowel sounds. Extremities: no cyanosis, clubbing, rash, edema Neuro: alert & oriented x 3, cranial nerves grossly intact. moves all 4 extremities w/o difficulty. Affect pleasant.  Telemetry   NSR (personally reviewed)  Labs    CBC Recent Labs    08/28/22 0412  08/29/22 0216  WBC 5.2 5.6  HGB 11.6* 12.4  HCT 36.4 39.3  MCV 84.7 84.0  PLT 230 236   Basic Metabolic Panel Recent Labs    08/27/22 0645 08/28/22 0412 08/29/22 0216  NA 141 139 135  K 3.8 3.1* 3.3*  CL 103 101 100  CO2 25 25 24  GLUCOSE 120* 163* 134*  BUN 12 9 10  CREATININE 1.34* 1.14* 1.13*  CALCIUM 9.3 8.7* 8.7*  MG 1.6*  --   --    Liver Function Tests Recent Labs    08/28/22 0412 08/29/22 0216  AST 43* 35  ALT 95* 81*  ALKPHOS 100 110  BILITOT 1.0 0.9  PROT 6.0* 6.3*  ALBUMIN 2.9* 3.0*   No results for input(s): "LIPASE", "AMYLASE" in the last 72 hours.  Cardiac Enzymes No results for input(s): "CKTOTAL", "CKMB", "CKMBINDEX", "TROPONINI" in the last 72 hours.  BNP: BNP (last 3 results) Recent Labs    08/25/22 2230  BNP 2,502.5*    ProBNP (last 3 results) No results for input(s): "PROBNP" in the last 8760 hours.   D-Dimer No results for input(s): "DDIMER" in the last 72 hours. Hemoglobin A1C Recent Labs    08/27/22 0735  HGBA1C 6.1*   Fasting Lipid Panel Recent Labs    08/27/22 0645  CHOL 77  HDL 20*  LDLCALC 42  TRIG 75  CHOLHDL 3.9   Thyroid Function   Tests Recent Labs    08/27/22 0500  TSH 1.486    Other results:   Imaging    No results found.   Medications:     Scheduled Medications:  amitriptyline  75 mg Oral QHS   Chlorhexidine Gluconate Cloth  6 each Topical Daily   dapagliflozin propanediol  10 mg Oral Daily   gabapentin  300 mg Oral BID   insulin aspart  0-5 Units Subcutaneous QHS   insulin aspart  0-9 Units Subcutaneous TID WC   pantoprazole  40 mg Oral Daily   rosuvastatin  5 mg Oral Daily   sacubitril-valsartan  1 tablet Oral BID   sodium chloride flush  10-40 mL Intracatheter Q12H   spironolactone  12.5 mg Oral Daily   torsemide  20 mg Oral Daily    Infusions:  sodium chloride     sodium chloride 10 mL/hr at 08/29/22 0544   heparin 950 Units/hr (08/29/22 0725)    PRN Medications: sodium  chloride, acetaminophen **OR** acetaminophen, albuterol, fentaNYL (SUBLIMAZE) injection, hydrOXYzine, polyethylene glycol, sodium chloride flush, sodium chloride flush    Assessment/Plan   1.  Acute on chronic systolic CHF:  Echo 5/24 EF <20%, GIIIDD, RV mod reduced, mod elevated PASP ~58, LA mod dilated, RA mildly dilated, mild MR, mod TR. NYHA III-IV at admission,  previous hx heavy alcohol use but quit a number of years ago. HIV negative.  No recent viral-type illness.  She does have CAD risk factors with family history, heavy smoking, diabetes. Not ACS-like presentation.  She has diuresed well with IV Lasix, weight down 18 lb. Now on PO torsemide 20 mg daily. CVP 9. Co-ox 60% - Plan R/LHC today  - Increase Spironolactone to 25 mg daily  - Continue Farxiga 10 daily - Continue Entresto 24/26. BP too soft for titration  - If cath not revealing will need cMRI. She did get IV Fe this admit (Ferrlecit). Will need to wait at least a week prior to conducting study. Can do as outpatient.  2.  HTN: BP controlled.  - GDMT per above  3.  Elevated LFTs: Trending down.  Suspect hepatic congestion with CHF.  DMII: Hgb A1c 6.5 2/24 - per primary 5. Smoker: Currently heavy smoker, 2 PPD. Has smoked since she was 11. - cessation heavily encouraged 6. DVT: Left leg DVT found.  Had DVT back in 1980s found after she delivered her son.   - Hypercoagulable w/u in process, checking factor V Leiden, prothrombin gene mutation, and antiphospholipid antibody  - V/Q scan to assess for PE. - Continue heparin gtt. Transition to Eliquis after completion of invasive procedures  7. Fe deficiency anemia: given IV Fe.  Check hemoccult.  8. Pelvic Pressure: Pt thinks she may have a UTI but no dysuria. AF. WBC WNL. UA has been ordered.  Length of Stay: 3  Kendra Simmons, PA-C  08/29/2022, 9:16 AM  Advanced Heart Failure Team Pager [phone] (M-F; 7a - 5p)  Please contact [facility] for night-coverage after  hours (5p -7a ) and weekends on [email or web address]  Patient seen and examined with the above-signed Advanced Practice Provider and/or Housestaff. I personally reviewed laboratory data, imaging studies and relevant notes. I independently examined the patient and formulated the important aspects of the plan. I have edited the note to reflect any of my changes or salient points. I have personally discussed the plan with the patient and/or family.  Weight down 18 pounds. Feels better. No orthopnea or PND. CVP 9. Co-ox ok    General:  Sitting up  No resp difficulty HEENT: normal Neck: supple. JVP 8-9 Carotids 2+ bilat; no bruits. No lymphadenopathy or thryomegaly appreciated. Cor: PMI nondisplaced. Regular rate & rhythm. No rubs, gallops or murmurs. Lungs: clear Abdomen: soft, nontender, nondistended. No hepatosplenomegaly. No bruits or masses. Good bowel sounds. Extremities: no cyanosis, clubbing, rash, edema Neuro: alert & orientedx3, cranial nerves grossly intact. moves all 4 extremities w/o difficulty. Affect pleasant  Improved with diuresis. Plan R/L cath today, If no CAD will need cMRI but has received IV iron so will have to coordinate timing. Continue to titrate GDMT.   Kendra Adsit, MD  10:26 AM    

## 2022-08-29 NOTE — Progress Notes (Addendum)
Patient ID: ELFA DEPASS, female   DOB: 12-05-1965, 57 y.o.   MRN: [id number]     Advanced Heart Failure Rounding Note  PCP-Cardiologist: Dr. Shirlee Latch (new)  Subjective:    Wt down 18 lb w/ diuresis. Transitioned to PO diuretics yesterday.  Co-ox 60%. CVP 9. Awaiting R/LHC today   K 3.3   LE venous doppler + for LLE DVT, on heparin gtt. V/Q scan ordered.   Feels ok this morning. Denies resting dyspnea. No CP. No leg pain. Complains of mild pelvic pressure. Thinks she may have a UTI but no dysuria. AF. WBC WNL. UA has been ordered.   Objective:   Weight Range: 68 kg Body mass index is 24.94 kg/m.   Vital Signs:   Temp:  [97.6 F (36.4 C)-98.5 F (36.9 C)] 97.6 F (36.4 C) (05/20 0833) Pulse Rate:  [83-100] 91 (05/20 0833) Resp:  [16-20] 19 (05/20 0833) BP: (98-109)/(70-81) 103/79 (05/20 0833) SpO2:  [94 %-100 %] 94 % (05/20 0833) Weight:  [68 kg] 68 kg (05/20 0617) Last BM Date : 08/24/22  Weight change: Filed Weights   08/27/22 0707 08/28/22 0459 08/29/22 0617  Weight: 72.5 kg 70.3 kg 68 kg    Intake/Output:   Intake/Output Summary (Last 24 hours) at 08/29/2022 0916 Last data filed at 08/29/2022 0600 Gross per 24 hour  Intake 469.53 ml  Output 2750 ml  Net -2280.47 ml      Physical Exam    CVP 9 General:  Well appearing. No respiratory difficulty HEENT: normal Neck: supple. JVD 8 cm. Carotids 2+ bilat; no bruits. No lymphadenopathy or thyromegaly appreciated. Cor: PMI nondisplaced. Regular rate & rhythm. No rubs, gallops or murmurs. Lungs: decreased BS at the bases bilaterally, no wheezing  Abdomen: soft, nontender, nondistended. No hepatosplenomegaly. No bruits or masses. Good bowel sounds. Extremities: no cyanosis, clubbing, rash, edema Neuro: alert & oriented x 3, cranial nerves grossly intact. moves all 4 extremities w/o difficulty. Affect pleasant.  Telemetry   NSR (personally reviewed)  Labs    CBC Recent Labs    08/28/22 0412  08/29/22 0216  WBC 5.2 5.6  HGB 11.6* 12.4  HCT 36.4 39.3  MCV 84.7 84.0  PLT 230 236   Basic Metabolic Panel Recent Labs    78/29/56 0645 08/28/22 0412 08/29/22 0216  NA 141 139 135  K 3.8 3.1* 3.3*  CL 103 101 100  CO2 25 25 24   GLUCOSE 120* 163* 134*  BUN 12 9 10   CREATININE 1.34* 1.14* 1.13*  CALCIUM 9.3 8.7* 8.7*  MG 1.6*  --   --    Liver Function Tests Recent Labs    08/28/22 0412 08/29/22 0216  AST 43* 35  ALT 95* 81*  ALKPHOS 100 110  BILITOT 1.0 0.9  PROT 6.0* 6.3*  ALBUMIN 2.9* 3.0*   No results for input(s): "LIPASE", "AMYLASE" in the last 72 hours.  Cardiac Enzymes No results for input(s): "CKTOTAL", "CKMB", "CKMBINDEX", "TROPONINI" in the last 72 hours.  BNP: BNP (last 3 results) Recent Labs    08/25/22 2230  BNP 2,502.5*    ProBNP (last 3 results) No results for input(s): "PROBNP" in the last 8760 hours.   D-Dimer No results for input(s): "DDIMER" in the last 72 hours. Hemoglobin A1C Recent Labs    08/27/22 0735  HGBA1C 6.1*   Fasting Lipid Panel Recent Labs    08/27/22 0645  CHOL 77  HDL 20*  LDLCALC 42  TRIG 75  CHOLHDL 3.9   Thyroid Function  Tests Recent Labs    08/27/22 0500  TSH 1.486    Other results:   Imaging    No results found.   Medications:     Scheduled Medications:  amitriptyline  75 mg Oral QHS   Chlorhexidine Gluconate Cloth  6 each Topical Daily   dapagliflozin propanediol  10 mg Oral Daily   gabapentin  300 mg Oral BID   insulin aspart  0-5 Units Subcutaneous QHS   insulin aspart  0-9 Units Subcutaneous TID WC   pantoprazole  40 mg Oral Daily   rosuvastatin  5 mg Oral Daily   sacubitril-valsartan  1 tablet Oral BID   sodium chloride flush  10-40 mL Intracatheter Q12H   spironolactone  12.5 mg Oral Daily   torsemide  20 mg Oral Daily    Infusions:  sodium chloride     sodium chloride 10 mL/hr at 08/29/22 0544   heparin 950 Units/hr (08/29/22 0725)    PRN Medications: sodium  chloride, acetaminophen **OR** acetaminophen, albuterol, fentaNYL (SUBLIMAZE) injection, hydrOXYzine, polyethylene glycol, sodium chloride flush, sodium chloride flush    Assessment/Plan   1.  Acute on chronic systolic CHF:  Echo 5/24 EF <20%, GIIIDD, RV mod reduced, mod elevated PASP ~58, LA mod dilated, RA mildly dilated, mild MR, mod TR. NYHA III-IV at admission,  previous hx heavy alcohol use but quit a number of years ago. HIV negative.  No recent viral-type illness.  She does have CAD risk factors with family history, heavy smoking, diabetes. Not ACS-like presentation.  She has diuresed well with IV Lasix, weight down 18 lb. Now on PO torsemide 20 mg daily. CVP 9. Co-ox 60% - Plan R/LHC today  - Increase Spironolactone to 25 mg daily  - Continue Farxiga 10 daily - Continue Entresto 24/26. BP too soft for titration  - If cath not revealing will need cMRI. She did get IV Fe this admit (Ferrlecit). Will need to wait at least a week prior to conducting study. Can do as outpatient.  2.  HTN: BP controlled.  - GDMT per above  3.  Elevated LFTs: Trending down.  Suspect hepatic congestion with CHF.  DMII: Hgb A1c 6.5 2/24 - per primary 5. Smoker: Currently heavy smoker, 2 PPD. Has smoked since she was 11. - cessation heavily encouraged 6. DVT: Left leg DVT found.  Had DVT back in 1980s found after she delivered her son.   - Hypercoagulable w/u in process, checking factor V Leiden, prothrombin gene mutation, and antiphospholipid antibody  - V/Q scan to assess for PE. - Continue heparin gtt. Transition to Eliquis after completion of invasive procedures  7. Fe deficiency anemia: given IV Fe.  Check hemoccult.  8. Pelvic Pressure: Pt thinks she may have a UTI but no dysuria. AF. WBC WNL. UA has been ordered.  Length of Stay: [id number] Delmer Islam  08/29/2022, 9:16 AM  Advanced Heart Failure Team Pager [phone] (M-F; 7a - 5p)  Please contact [facility] for night-coverage after  hours (5p -7a ) and weekends on [email or web address]  Patient seen and examined with the above-signed Advanced Practice Provider and/or Housestaff. I personally reviewed laboratory data, imaging studies and relevant notes. I independently examined the patient and formulated the important aspects of the plan. I have edited the note to reflect any of my changes or salient points. I have personally discussed the plan with the patient and/or family.  Weight down 18 pounds. Feels better. No orthopnea or PND. CVP 9. Co-ox ok  General:  Sitting up  No resp difficulty HEENT: normal Neck: supple. JVP 8-9 Carotids 2+ bilat; no bruits. No lymphadenopathy or thryomegaly appreciated. Cor: PMI nondisplaced. Regular rate & rhythm. No rubs, gallops or murmurs. Lungs: clear Abdomen: soft, nontender, nondistended. No hepatosplenomegaly. No bruits or masses. Good bowel sounds. Extremities: no cyanosis, clubbing, rash, edema Neuro: alert & orientedx3, cranial nerves grossly intact. moves all 4 extremities w/o difficulty. Affect pleasant  Improved with diuresis. Plan R/L cath today, If no CAD will need cMRI but has received IV iron so will have to coordinate timing. Continue to titrate GDMT.   Arvilla Meres, MD  10:26 AM

## 2022-08-29 NOTE — TOC Benefit Eligibility Note (Signed)
Patient Product/process development scientist completed.    The patient is currently admitted and upon discharge could be taking Eliquis.  The current 30 day co-pay is $4.00.   The patient is insured through [employer]   This test claim was processed through [facility]- copay amounts may vary at other pharmacies due to pharmacy/plan contracts, or as the patient moves through the different stages of their insurance plan.

## 2022-08-29 NOTE — Telephone Encounter (Signed)
Pharmacy Patient Advocate Encounter  Insurance verification completed.    The patient is insured through [employer]   The patient is currently admitted and ran test claims for the following: Eliquis.  Copays and coinsurance results were relayed to Inpatient clinical team.  

## 2022-08-29 NOTE — Progress Notes (Signed)
[facility]                                                                              Kendra Griffith, is a 57 y.o. female, DOB - 12/02/1965, ZOX:[id number] Admit date - 08/25/2022    Outpatient Primary MD for the patient is Claiborne Rigg, NP  LOS - 2  days  Chief Complaint  Patient presents with   Depression   nausea       Brief summary   Patient is a 57 year old female with history of diabetes mellitus, HTN, HLP, bipolar disorder, chronic pain, depression/anxiety presented to ED with multiple complaints including loss of appetite, nausea, shortness of breath with exertion, lower extremity swelling.  Patient reported approximately 1 month of poor appetite and nausea, new onset of bilateral lower leg swelling over the past week or so.  She reported that in the last week she is having worsening exertional dyspnea and difficulty sleeping.  Denied any chest pain, abdominal pain or fevers. In ED, afebrile, O2 sats 100% on room air, BP 133/98, RR 18, pulse 95 Chest x-ray showed cardiomegaly, mildly elevated LFTs, BNP 2503 Received Lasix 40 mg IV x 1 in ED, cardiology was consulted.   Assessment & Plan    Principal Problem:   New onset of congestive heart failure (HCC), acute systolic and diastolic, biventricular -Presented with volume overload, shortness of breath, lower extremity edema, elevated BNP, cardiomegaly on chest x-ray -2D echo: EF < 20%, global hypokinesis, grade 3 DD, right ventricular systolic function moderately reduced, moderately elevated pulmonary artery systolic pressure, moderate TR. - continue aldactone, farxiga, Entresto -Initially placed on IV Lasix for diuresis, negative balance of 7.3 L.   -Now transitioned to torsemide, right and left heart cath today   Active Problems: Acute Left LE DVT in popliteal and peroneal vein: - Venous Doppler + ac DVT, started heparin gtt -Cardiac cath completed today, transition to apixaban -VQ scan  pending - f/u hypercoag panel     Elevated transaminase level -Likely due to #1, congestive hepatopathy.  Acute hepatitis panel negative -RUQ ultrasound showed gallbladder sludge without sonographic evidence of acute cholecystitis -Currently no abdominal pain, LFTs improving    Essential hypertension, benign -Continue losartan, Aldactone, torsemide    Chronic pain syndrome -Continue amitriptyline, gabapentin  Diet-controlled diabetes mellitus -Hemoglobin A1c 6.5 on 05/18/2022, repeat A1c -Placed on sliding scale insulin while inpatient CBG (last 3)  Recent Labs    08/27/22 2121 08/28/22 0621 08/28/22 1225  GLUCAP 150* 160* 120*   Depression with anxiety -Continue amitriptyline, hydroxyzine as needed  Estimated body mass index is 25.79 kg/m as calculated from the following:   Height as of this encounter: 5\' 5"  (1.651 m).   Weight as of this encounter: 70.3 kg.  Code Status: Full code DVT Prophylaxis:  Place TED hose Start: 08/26/22 1315   Level of Care: Level of care: Telemetry Cardiac Family Communication: Updated patient Disposition Plan:      Remains inpatient appropriate: Need cardiac workup   Procedures:  2D echo Venous Dopplers lower extremities Right and left heart cath  Consultants:   Cardiology  Antimicrobials: None  Medications  amitriptyline  75 mg Oral QHS   Chlorhexidine Gluconate Cloth  6 each Topical Daily   dapagliflozin propanediol  10 mg Oral Daily   gabapentin  300 mg Oral BID   insulin aspart  0-5 Units Subcutaneous QHS   insulin aspart  0-9 Units Subcutaneous TID WC   pantoprazole  40 mg Oral Daily   rosuvastatin  5 mg Oral Daily   sacubitril-valsartan  1 tablet Oral BID   sodium chloride flush  10-40 mL Intracatheter Q12H   spironolactone  12.5 mg Oral Daily   [START ON 08/29/2022] torsemide  20 mg Oral Daily      Subjective:   Kendra Griffith was seen and examined today.  No acute complaints, overall feeling better, son at the  bedside.  No acute chest pain, shortness of breath, cough or fevers.  Objective:   Vitals:   08/28/22 0459 08/28/22 0830 08/28/22 0833 08/28/22 1108  BP:   103/77 104/74  Pulse:   85 83  Resp:   18 18  Temp:  98.1 F (36.7 C)  97.9 F (36.6 C)  TempSrc:  Oral  Oral  SpO2:   100% 98%  Weight: 70.3 kg     Height:        Intake/Output Summary (Last 24 hours) at 08/28/2022 1254 Last data filed at 08/28/2022 1100 Gross per 24 hour  Intake 1269.32 ml  Output 4500 ml  Net -3230.68 ml     Wt Readings from Last 3 Encounters:  08/28/22 70.3 kg  08/17/22 75.8 kg  05/18/22 76.7 kg   Physical Exam General: Alert and oriented x 3, NAD Cardiovascular: S1 S2 clear, RRR. JVD+ Respiratory: Decreased breath sound at the bases, no wheezing or rhonchi Gastrointestinal: Soft, nontender, nondistended, NBS Ext: no pedal edema bilaterally Neuro: no new deficits Psych: Normal affect    Data Reviewed:  I have personally reviewed following labs    CBC Lab Results  Component Value Date   WBC 5.2 08/28/2022   RBC 4.30 08/28/2022   HGB 11.6 (L) 08/28/2022   HCT 36.4 08/28/2022   MCV 84.7 08/28/2022   MCH 27.0 08/28/2022   PLT 230 08/28/2022   MCHC 31.9 08/28/2022   RDW 15.7 (H) 08/28/2022   LYMPHSABS 0.7 08/25/2022   MONOABS 0.2 08/25/2022   EOSABS 0.0 08/25/2022   BASOSABS 0.0 08/25/2022     Last metabolic panel Lab Results  Component Value Date   NA 139 08/28/2022   K 3.1 (L) 08/28/2022   CL 101 08/28/2022   CO2 25 08/28/2022   BUN 9 08/28/2022   CREATININE 1.14 (H) 08/28/2022   GLUCOSE 163 (H) 08/28/2022   GFRNONAA 56 (L) 08/28/2022   GFRAA 81 02/24/2020   CALCIUM 8.7 (L) 08/28/2022   PROT 6.0 (L) 08/28/2022   ALBUMIN 2.9 (L) 08/28/2022   LABGLOB 2.5 05/18/2022   AGRATIO 1.8 05/18/2022   BILITOT 1.0 08/28/2022   ALKPHOS 100 08/28/2022   AST 43 (H) 08/28/2022   ALT 95 (H) 08/28/2022   ANIONGAP 13 08/28/2022    CBG (last 3)  Recent Labs    08/27/22 2121  08/28/22 0621 08/28/22 1225  GLUCAP 150* 160* 120*      Coagulation Profile: No results for input(s): "INR", "PROTIME" in the last 168 hours.   Radiology Studies: I have personally reviewed the imaging studies  VAS [place] LOWER EXTREMITY VENOUS (DVT)  Result Date: 08/28/2022  Lower Venous DVT Study Patient Name:  Kendra Griffith  Date of Exam:  08/27/2022 Medical Rec #: [id number]        Accession #:    [id number] Date of Birth: 1966-02-08        Patient Gender: F Patient Age:   41 years Exam Location:  [facility] Procedure:      VAS [place] LOWER EXTREMITY VENOUS (DVT) Referring Phys: Mattea Seger --------------------------------------------------------------------------------  Indications: Pain, Swelling, and SOB.  Risk Factors: EF >20%. Comparison Study: No prior study on file Performing Technologist: Sherren Kerns RVS  Examination Guidelines: A complete evaluation includes B-mode imaging, spectral Doppler, color Doppler, and power Doppler as needed of all accessible portions of each vessel. Bilateral testing is considered an integral part of a complete examination. Limited examinations for reoccurring indications may be performed as noted. The reflux portion of the exam is performed with the patient in reverse Trendelenburg.  +--------+---------------+---------+-----------+----------------+-------------+ RIGHT   CompressibilityPhasicitySpontaneityProperties      Thrombus                                                                 Aging         +--------+---------------+---------+-----------+----------------+-------------+ CFV     Full                               pulsatile                                                                waveforms                     +--------+---------------+---------+-----------+----------------+-------------+ SFJ     Full                                                              +--------+---------------+---------+-----------+----------------+-------------+ FV Prox Full                                                             +--------+---------------+---------+-----------+----------------+-------------+ FV Mid  Full                               pulsatile                                                                waveforms                     +--------+---------------+---------+-----------+----------------+-------------+  FV      Full                                                             Distal                                                                   +--------+---------------+---------+-----------+----------------+-------------+ PFV     Full                                                             +--------+---------------+---------+-----------+----------------+-------------+ POP     Full                               pulsatile                                                                waveforms                     +--------+---------------+---------+-----------+----------------+-------------+ PTV     Full                                                             +--------+---------------+---------+-----------+----------------+-------------+ PERO    Full                                                             +--------+---------------+---------+-----------+----------------+-------------+   +--------+---------------+---------+-----------+----------------+-------------+ LEFT    CompressibilityPhasicitySpontaneityProperties      Thrombus                                                                 Aging         +--------+---------------+---------+-----------+----------------+-------------+ CFV     Full                               pulsatile  waveforms                      +--------+---------------+---------+-----------+----------------+-------------+ SFJ     Full                                                             +--------+---------------+---------+-----------+----------------+-------------+ FV Prox Full                                                             +--------+---------------+---------+-----------+----------------+-------------+ FV Mid  Full                               pulsatile                                                                waveforms                     +--------+---------------+---------+-----------+----------------+-------------+ FV      Full                                                             Distal                                                                   +--------+---------------+---------+-----------+----------------+-------------+ PFV     Full                                                             +--------+---------------+---------+-----------+----------------+-------------+ POP     None                               pulsatile                                                                waveforms                     +--------+---------------+---------+-----------+----------------+-------------+ PTV  Full                                                             +--------+---------------+---------+-----------+----------------+-------------+ PERO    None                                                             +--------+---------------+---------+-----------+----------------+-------------+     Summary: RIGHT: - There is no evidence of deep vein thrombosis in the lower extremity.  pulsatile waveforms suggestive of fluid overload  LEFT: - Findings consistent with acute deep vein thrombosis involving the left popliteal vein, and left peroneal veins. Pulsatile waveforms suggestive of fluid overload.  *See table(s) above for measurements  and observations. Electronically signed by Gerarda Fraction on 08/28/2022 at 8:33:45 AM.    Final        Thad Ranger M.D. [facility] 08/28/2022, 12:54 PM  Available via Epic secure chat 7am-7pm After 7 pm, please refer to night coverage provider listed on amion.

## 2022-08-30 ENCOUNTER — Telehealth (HOSPITAL_COMMUNITY): Payer: Self-pay | Admitting: Pharmacy Technician

## 2022-08-30 ENCOUNTER — Other Ambulatory Visit (HOSPITAL_COMMUNITY): Payer: Self-pay

## 2022-08-30 ENCOUNTER — Encounter (HOSPITAL_COMMUNITY): Payer: Self-pay | Admitting: Internal Medicine

## 2022-08-30 DIAGNOSIS — I82401 Acute embolism and thrombosis of unspecified deep veins of right lower extremity: Secondary | ICD-10-CM

## 2022-08-30 DIAGNOSIS — I509 Heart failure, unspecified: Secondary | ICD-10-CM | POA: Diagnosis not present

## 2022-08-30 DIAGNOSIS — F418 Other specified anxiety disorders: Secondary | ICD-10-CM | POA: Diagnosis not present

## 2022-08-30 DIAGNOSIS — R0602 Shortness of breath: Secondary | ICD-10-CM | POA: Diagnosis not present

## 2022-08-30 DIAGNOSIS — G894 Chronic pain syndrome: Secondary | ICD-10-CM | POA: Diagnosis not present

## 2022-08-30 LAB — COMPREHENSIVE METABOLIC PANEL
ALT: 66 U/L — ABNORMAL HIGH (ref 0–44)
AST: 37 U/L (ref 15–41)
Albumin: 3 g/dL — ABNORMAL LOW (ref 3.5–5.0)
Alkaline Phosphatase: 126 U/L (ref 38–126)
Anion gap: 11 (ref 5–15)
BUN: 14 mg/dL (ref 6–20)
CO2: 27 mmol/L (ref 22–32)
Calcium: 9 mg/dL (ref 8.9–10.3)
Chloride: 102 mmol/L (ref 98–111)
Creatinine, Ser: 1.27 mg/dL — ABNORMAL HIGH (ref 0.44–1.00)
GFR, Estimated: 50 mL/min — ABNORMAL LOW (ref >60)
Glucose, Bld: 174 mg/dL — ABNORMAL HIGH (ref 70–99)
Potassium: 4.1 mmol/L (ref 3.5–5.1)
Sodium: 140 mmol/L (ref 135–145)
Total Bilirubin: 0.5 mg/dL (ref 0.3–1.2)
Total Protein: 6.5 g/dL (ref 6.5–8.1)

## 2022-08-30 LAB — CBC
HCT: 44 % (ref 36.0–46.0)
Hemoglobin: 13.6 g/dL (ref 12.0–15.0)
MCH: 26.6 pg (ref 26.0–34.0)
MCHC: 30.9 g/dL (ref 30.0–36.0)
MCV: 86.1 fL (ref 80.0–100.0)
Platelets: 273 10*3/uL (ref 150–400)
RBC: 5.11 MIL/uL (ref 3.87–5.11)
RDW: 16.1 % — ABNORMAL HIGH (ref 11.5–15.5)
WBC: 5.1 10*3/uL (ref 4.0–10.5)
nRBC: 7.7 % — ABNORMAL HIGH (ref 0.0–0.2)

## 2022-08-30 LAB — COOXEMETRY PANEL
Carboxyhemoglobin: 1.6 % — ABNORMAL HIGH (ref 0.5–1.5)
Methemoglobin: <0.7 % (ref 0.0–1.5)
O2 Saturation: 61.5 %
Total hemoglobin: 14 g/dL (ref 12.0–16.0)

## 2022-08-30 LAB — GLUCOSE, CAPILLARY
Glucose-Capillary: 182 mg/dL — ABNORMAL HIGH (ref 70–99)
Glucose-Capillary: 188 mg/dL — ABNORMAL HIGH (ref 70–99)
Glucose-Capillary: 178 mg/dL — ABNORMAL HIGH (ref 70–99)
Glucose-Capillary: 138 mg/dL — ABNORMAL HIGH (ref 70–99)

## 2022-08-30 LAB — HEPARIN LEVEL (UNFRACTIONATED): Heparin Unfractionated: >1.1 IU/mL — ABNORMAL HIGH (ref 0.30–0.70)

## 2022-08-30 MED ORDER — POTASSIUM CHLORIDE CRYS ER 20 MEQ PO TBCR
40.0000 meq | EXTENDED_RELEASE_TABLET | Freq: Once | ORAL | Status: AC
  Administered 2022-08-30: 40 meq via ORAL
  Filled 2022-08-30: qty 2

## 2022-08-30 MED ORDER — SORBITOL 70 % SOLN
30.0000 mL | Status: AC
  Administered 2022-08-30: 30 mL via ORAL
  Filled 2022-08-30: qty 30

## 2022-08-30 MED ORDER — FUROSEMIDE 10 MG/ML IJ SOLN
80.0000 mg | Freq: Once | INTRAMUSCULAR | Status: AC
  Administered 2022-08-30: 80 mg via INTRAVENOUS
  Filled 2022-08-30: qty 8

## 2022-08-30 MED ORDER — FUROSEMIDE 10 MG/ML IJ SOLN: 80.0000 mg | Freq: Once | INTRAMUSCULAR | Status: DC

## 2022-08-30 MED ORDER — SENNOSIDES-DOCUSATE SODIUM 8.6-50 MG PO TABS
2.0000 | ORAL_TABLET | Freq: Every day | ORAL | Status: DC
  Administered 2022-08-30: 2 via ORAL
  Filled 2022-08-30: qty 2

## 2022-08-30 NOTE — TOC Initial Note (Signed)
Transition of Care [facility]) - Initial/Assessment Note    Patient Details  Name: Kendra Griffith MRN: [id number] Date of Birth: November 26, 1965  [facility] CM/SW Contact:    Elliot Cousin, RN Phone Number: [phone] 08/30/2022, 3:02 PM  Clinical Narrative:      HF TOC CM spoke to pt and states she lives alone. Her son visits on the weekend. She uses medical transportation to appt. Will continue to follow for dc needs.              Patient has follow up appt with PCP, Dr Sharon Seller on 5/30 at 1050 am.   Expected Discharge Plan: Home/Self Care Barriers to Discharge: Continued Medical Work up   Patient Goals and CMS Choice Patient states their goals for this hospitalization and ongoing recovery are:: wants to be able to care for herself at home          Expected Discharge Plan and Services   Discharge Planning Services: CM Consult   Living arrangements for the past 2 months: Single Family Home                                      Prior Living Arrangements/Services Living arrangements for the past 2 months: Single Family Home Lives with:: Self Patient language and need for interpreter reviewed:: Yes Do you feel safe going back to the place where you live?: Yes      Need for Family Participation in Patient Care: No (Comment) Care giver support system in place?: Yes (comment) Current home services: DME (rolling walker, cane, scale) Criminal Activity/Legal Involvement Pertinent to Current Situation/Hospitalization: No - Comment as needed  Activities of Daily Living      Permission Sought/Granted Permission sought to share information with : Case Manager, Family Supports Permission granted to share information with : Yes, Verbal Permission Granted  Share Information with NAME: Sena Hitch     Permission granted to share info w Relationship: son  Permission granted to share info w Contact Information: [phone]  Emotional  Assessment Appearance:: Appears stated age Attitude/Demeanor/Rapport: Engaged Affect (typically observed): Accepting Orientation: : Oriented to Self, Oriented to Place, Oriented to  Time, Oriented to Situation   Psych Involvement: No (comment)  Admission diagnosis:  SOB (shortness of breath) [R06.02] New onset of congestive heart failure (HCC) [I50.9] Patient Active Problem List   Diagnosis Date Noted   Acute LLE DVT (deep venous thrombosis) (HCC) 08/27/2022   New onset of congestive heart failure (HCC) 08/26/2022   Elevated transaminase level 08/26/2022   Drug-induced mood disorder (HCC) 05/18/2022   Post-operative state 09/04/2019   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   Tobacco use 06/09/2017   Breast cancer screening 01/21/2016   Chronic pain syndrome 09/25/2014   Type 2 diabetes mellitus with diabetic polyneuropathy, without long-term current use of insulin (HCC) 06/30/2014   Peripheral neuropathy 05/09/2013   Poor dentition 02/01/2013   Pain in joint, ankle and foot 02/01/2013   Essential hypertension, benign 02/01/2013   FOOT PAIN, BILATERAL 06/16/2010   RECTAL BLEEDING 12/08/2009   HEMATURIA UNSPECIFIED 12/08/2009   Depression with anxiety 08/20/2009   Mixed hyperlipidemia 07/30/2009   ANEMIA-NOS 07/30/2009   GERD 07/30/2009   CALLUS, FOOT 07/30/2009   PCP:  Claiborne Rigg, NP Pharmacy:   [facility] [phone] - [place], [place] - [address] AT [facility] OF [address] & [address]  [address] [place] [address] Phone: [phone] Fax: [phone]  [facility] - [facility] [address], [address] [place] [address] Phone: [phone] Fax: [phone]  [facility] [address] [place] [address] Phone: [phone] Fax: [phone]     Social Determinants of Health (SDOH) Social History: SDOH Screenings   Food Insecurity: No Food  Insecurity (09/04/2019)  Housing: Low Risk  (08/30/2022)  Transportation Needs: Unmet Transportation Needs (09/04/2019)  Depression (PHQ2-9): High Risk (08/17/2022)  Physical Activity: Inactive (06/08/2017)  Social Connections: Socially Isolated (06/08/2017)  Stress: No Stress Concern Present (06/08/2017)  Tobacco Use: High Risk (08/30/2022)   SDOH Interventions: Housing Interventions: Intervention Not Indicated   Readmission Risk Interventions     No data to display

## 2022-08-30 NOTE — Telephone Encounter (Signed)
Pharmacy Patient Advocate Encounter  Insurance verification completed.    The patient is insured through [facility] [employer] [place]   The patient is currently admitted and ran test claims for the following: Kendra Griffith.  Copays and coinsurance results were relayed to Inpatient clinical team.

## 2022-08-30 NOTE — Telephone Encounter (Signed)
Patient Advocate Encounter   Received notification that prior authorization for Jardiance 10MG  tablets is required.   PA submitted on 08/30/2022 Key [id number] Insurance [employer] Electronic Prior Authorization Form Status is pending       Roland Earl, CPhT Pharmacy Patient Advocate Specialist [facility] Patient Advocate Team Direct Number: [phone]  Fax: [phone]

## 2022-08-30 NOTE — Telephone Encounter (Signed)
Patient Advocate Encounter   Received notification that prior authorization for Entresto 24-26MG  tablets is required.   PA submitted on 08/30/2022 Key [id number] Insurance [employer] Electronic Prior Authorization Form Status is pending       Roland Earl, CPhT Pharmacy Patient Advocate Specialist [facility] Patient Advocate Team Direct Number: [phone]  Fax: [phone]

## 2022-08-30 NOTE — Progress Notes (Signed)
[facility]                                                                              Kendra Griffith, is a 57 y.o. female, DOB - [birth year], ZOX:[id number] Admit date - 08/25/2022    Outpatient Primary MD for the patient is Claiborne Rigg, NP  LOS - 4  days  Chief Complaint  Patient presents with   Depression   nausea       Brief summary   Patient is a 57 year old female with history of diabetes mellitus, HTN, HLP, bipolar disorder, chronic pain, depression/anxiety presented to ED with multiple complaints including loss of appetite, nausea, shortness of breath with exertion, lower extremity swelling.  Patient reported approximately 1 month of poor appetite and nausea, new onset of bilateral lower leg swelling over the past week or so.  She reported that in the last week she is having worsening exertional dyspnea and difficulty sleeping.  Denied any chest pain, abdominal pain or fevers. In ED, afebrile, O2 sats 100% on room air, BP 133/98, RR 18, pulse 95 Chest x-ray showed cardiomegaly, mildly elevated LFTs, BNP 2503 Received Lasix 40 mg IV x 1 in ED, cardiology was consulted.  5/20: R/L cardiac cath showed no CAD, elevated filling pressures, normal output.   Assessment & Plan    Principal Problem:   New onset of congestive heart failure (HCC), acute systolic and diastolic, biventricular -Presented with volume overload, shortness of breath, lower extremity edema, elevated BNP, cardiomegaly on chest x-ray -2D echo: EF < 20%, global hypokinesis, grade 3 DD, right ventricular systolic function moderately reduced, moderately elevated pulmonary artery systolic pressure, moderate TR. - R/L cardiac cath showed no CAD, elevated filling pressures, normal output. -VQ scan negative for PE, -IV Lasix resumed, 80 mg IV x 1, started on digoxin, Aldactone, Entresto   Active Problems: Acute Left LE DVT in popliteal and peroneal vein: - Venous Doppler + ac DVT -VQ scan  negative for PE, transitioned to apixaban     Elevated transaminase level -Likely due to #1, congestive hepatopathy.  Acute hepatitis panel negative -RUQ ultrasound showed gallbladder sludge without sonographic evidence of acute cholecystitis -LFTs improving    Essential hypertension, benign -BP stable    Chronic pain syndrome -Continue amitriptyline, gabapentin  Diet-controlled diabetes mellitus -Hemoglobin A1c 6.1 on 08/27/2022 -Continue sliding scale insulin while inpatient  CBG (last 3)  Recent Labs    08/29/22 2045 08/30/22 0601 08/30/22 1154  GLUCAP 153* 182* 188*   Depression with anxiety -Continue amitriptyline, hydroxyzine as needed  Estimated body mass index is 24.61 kg/m as calculated from the following:   Height as of this encounter: 5\' 5"  (1.651 m).   Weight as of this encounter: 67.1 kg.  Code Status: Full code DVT Prophylaxis:  Place TED hose Start: 08/26/22 1315 apixaban (ELIQUIS) tablet 10 mg  apixaban (ELIQUIS) tablet 5 mg   Level of Care: Level of care: Telemetry Cardiac Family Communication: Updated patient's son at bedside on 5/20 Disposition Plan:      Remains inpatient appropriate: Need cardiac workup   Procedures:  2D echo Venous Dopplers lower extremities Right and left  heart cath  Consultants:   Cardiology  Antimicrobials: None  Medications  amitriptyline  75 mg Oral QHS   apixaban  10 mg Oral BID   Followed by   Melene Muller ON 09/05/2022] apixaban  5 mg Oral BID   Chlorhexidine Gluconate Cloth  6 each Topical Daily   dapagliflozin propanediol  10 mg Oral Daily   digoxin  0.125 mg Oral Daily   furosemide  80 mg Intravenous Once   gabapentin  300 mg Oral BID   insulin aspart  0-5 Units Subcutaneous QHS   insulin aspart  0-9 Units Subcutaneous TID WC   pantoprazole  40 mg Oral Daily   potassium chloride  40 mEq Oral Once   rosuvastatin  5 mg Oral Daily   sacubitril-valsartan  1 tablet Oral BID   senna-docusate  2 tablet Oral QHS    sodium chloride flush  10-40 mL Intracatheter Q12H   sodium chloride flush  3 mL Intravenous Q12H   sorbitol  30 mL Oral NOW   spironolactone  25 mg Oral Daily      Subjective:   Kendra Griffith was seen and examined today.  Generalized weakness but otherwise no chest pain, shortness of breath is improving.  No fevers or chills, cough.   Objective:   Vitals:   08/29/22 1957 08/29/22 2318 08/30/22 0606 08/30/22 1152  BP: 91/68 90/66  94/75  Pulse: 96 98  100  Resp: 20 20  16   Temp: (!) 97.4 F (36.3 C) (!) 97.3 F (36.3 C)  (!) 97.5 F (36.4 C)  TempSrc: Oral Oral  Oral  SpO2: 98% 97%  97%  Weight:   67.1 kg   Height:        Intake/Output Summary (Last 24 hours) at 08/30/2022 1411 Last data filed at 08/30/2022 5638 Gross per 24 hour  Intake 620 ml  Output 2150 ml  Net -1530 ml     Wt Readings from Last 3 Encounters:  08/30/22 67.1 kg  08/17/22 75.8 kg  05/18/22 76.7 kg    Physical Exam General: Alert and oriented x 3, NAD HEENT: JVD + Cardiovascular: S1 S2 clear, RRR.  Respiratory: CTAB, no wheezing, rales or rhonchi Gastrointestinal: Soft, nontender, nondistended, NBS Ext: no pedal edema bilaterally Psych: Normal affect      Data Reviewed:  I have personally reviewed following labs    CBC Lab Results  Component Value Date   WBC 5.1 08/30/2022   RBC 5.11 08/30/2022   HGB 13.6 08/30/2022   HCT 44.0 08/30/2022   MCV 86.1 08/30/2022   MCH 26.6 08/30/2022   PLT 273 08/30/2022   MCHC 30.9 08/30/2022   RDW 16.1 (H) 08/30/2022   LYMPHSABS 0.7 08/25/2022   MONOABS 0.2 08/25/2022   EOSABS 0.0 08/25/2022   BASOSABS 0.0 08/25/2022     Last metabolic panel Lab Results  Component Value Date   NA 140 08/30/2022   K 4.1 08/30/2022   CL 102 08/30/2022   CO2 27 08/30/2022   BUN 14 08/30/2022   CREATININE 1.27 (H) 08/30/2022   GLUCOSE 174 (H) 08/30/2022   GFRNONAA 50 (L) 08/30/2022   GFRAA 81 02/24/2020   CALCIUM 9.0 08/30/2022   PROT 6.5  08/30/2022   ALBUMIN 3.0 (L) 08/30/2022   LABGLOB 2.5 05/18/2022   AGRATIO 1.8 05/18/2022   BILITOT 0.5 08/30/2022   ALKPHOS 126 08/30/2022   AST 37 08/30/2022   ALT 66 (H) 08/30/2022   ANIONGAP 11 08/30/2022    CBG (last 3)  Recent Labs    08/29/22 2045 08/30/22 0601 08/30/22 1154  GLUCAP 153* 182* 188*      Coagulation Profile: No results for input(s): "INR", "PROTIME" in the last 168 hours.   Radiology Studies: I have personally reviewed the imaging studies  NM Pulmonary Perf and Vent  Result Date: 08/29/2022 CLINICAL DATA:  Shortness of breath. EXAM: NUCLEAR MEDICINE PERFUSION LUNG SCAN TECHNIQUE: Perfusion images were obtained in multiple projections after intravenous injection of radiopharmaceutical. Ventilation scans intentionally deferred if perfusion scan and chest x-ray adequate for interpretation during COVID 19 epidemic. RADIOPHARMACEUTICALS:  3.8 mCi Tc-50m MAA IV COMPARISON:  Chest x-ray earlier 08/29/2022 and older FINDINGS: No significant perfusion defects identified. Enlarged cardiac shadow. IMPRESSION: Negative perfusion only exam Electronically Signed   By: Karen Kays M.D.   On: 08/29/2022 15:10   CARDIAC CATHETERIZATION  Result Date: 08/29/2022   The left ventricular ejection fraction is less than 25% by visual estimate. Findings: Ao = 109/82 (94) LV = 105/29 RA = 6 RV = 46/8 PA = 47/24 (33) PCW = 23 Fick cardiac output/index = 3.4/2.1 PVR = 2.6 WU SVR = 1,831 FA sat = 93% PA sat = 54%, 57% Assessment: 1. Normal coronary arteries 2. Severe NICM EF < 20% 3. Elevated filling pressures with low cardiac output Plan/Discussion: Will need ongoing diuresis and titration of GDMT. Plan cMRI this admit if possible with IV iron already given. Continue to follow co-ox with PICC. Arvilla Meres, MD 11:59 AM  DG Chest 1 View  Result Date: 08/29/2022 CLINICAL DATA:  141880 SOB (shortness of breath) 141880 EXAM: CHEST  1 VIEW COMPARISON:  Chest x-ray 09/18/2004, CT chest  06/30/2022 FINDINGS: Right PICC with tip overlying the expected region of the superior cavoatrial junction. Persistent mildly enlarged cardiac silhouette. Otherwise the heart and mediastinal contours are within normal limits. Lingular linear atelectasis versus scarring. No focal consolidation. Slightly coarsened interstitial markings with no overt pulmonary edema. No pleural effusion. No pneumothorax. No acute osseous abnormality. IMPRESSION: No acute cardiopulmonary abnormality in a patient with persistent mild cardiomegaly. Electronically Signed   By: Tish Frederickson M.D.   On: 08/29/2022 10:37       Orren Pietsch M.D. [facility] 08/30/2022, 2:11 PM  Available via Epic secure chat 7am-7pm After 7 pm, please refer to night coverage provider listed on amion.

## 2022-08-30 NOTE — TOC Benefit Eligibility Note (Signed)
Patient Product/process development scientist completed.    The patient is currently admitted and upon discharge could be taking Entresto 24-26 mg.  Requires Prior Authorization  The patient is currently admitted and upon discharge could be taking Jardiance 10 mg.  Requires Prior Authorization  The patient is insured through [facility] [place] Medicaid   This test claim was processed through [facility]- copay amounts may vary at other pharmacies due to pharmacy/plan contracts, or as the patient moves through the different stages of their insurance plan.  Roland Earl, CPHT Pharmacy Patient Advocate Specialist [facility] Patient Advocate Team Direct Number: [phone]  Fax: [phone]

## 2022-08-30 NOTE — Telephone Encounter (Signed)
Patient Advocate Encounter  Prior Authorization for Jardiance 10MG  tablets  has been approved.    PA# [id number] Insurance [employer] Electronic Prior Authorization Form  Effective dates: 08/30/2022 through 08/30/2023  Patients co-pay is $4.00.     Kendra Griffith, CPhT Pharmacy Patient Advocate Specialist [facility] Patient Advocate Team Direct Number: [phone]  Fax: [phone]

## 2022-08-30 NOTE — Progress Notes (Addendum)
Patient ID: Kendra Griffith, female   DOB: [birth year], 57 y.o.   MRN: [id number]     Advanced Heart Failure Rounding Note  PCP-Cardiologist: Dr. Shirlee Latch (new)  Subjective:    5/20: R/LHC c/w severe NICM, elevated filling pressures and low output, CI 2.1 >>IV diuretics restarted   3.5L in UOP yesterday w/ IV Lasix. Wt down 2 lb.   Co-ox 62%. FICK CI 2.0. CVP 8-9. SCr 1.13>>1.27. K 4.1   Says she feels "weaker" today but dyspnea improved.     CV Studies   2D Echo: EF < 20%, GIIDD, RV mod reduced   R/LHC: Ao = 109/82 (94) LV = 105/29 RA = 6 RV = 46/8 PA = 47/24 (33) PCW = 23 Fick cardiac output/index = 3.4/2.1 PVR = 2.6 WU SVR = 1,831 FA sat = 93% PA sat = 54%, 57%   Assessment: 1. Normal coronary arteries 2. Severe NICM EF < 20% 3. Elevated filling pressures with low cardiac output    LE venous dopplers + for LLE DVT  V/Q scan - for PE     Objective:   Weight Range: 67.1 kg Body mass index is 24.61 kg/m.   Vital Signs:   Temp:  [97.3 F (36.3 C)-98.1 F (36.7 C)] 97.3 F (36.3 C) (05/20 2318) Pulse Rate:  [0-99] 98 (05/20 2318) Resp:  [16-34] 20 (05/20 2318) BP: (90-138)/(66-90) 90/66 (05/20 2318) SpO2:  [90 %-99 %] 97 % (05/20 2318) Weight:  [67.1 kg] 67.1 kg (05/21 0606) Last BM Date : 08/24/22  Weight change: Filed Weights   08/28/22 0459 08/29/22 0617 08/30/22 0606  Weight: 70.3 kg 68 kg 67.1 kg    Intake/Output:   Intake/Output Summary (Last 24 hours) at 08/30/2022 0816 Last data filed at 08/30/2022 0649 Gross per 24 hour  Intake 770 ml  Output 3350 ml  Net -2580 ml      Physical Exam   CVP 8-9 General:  Well appearing. No respiratory difficulty HEENT: normal Neck: supple. JVD 8-9 cm,. Carotids 2+ bilat; no bruits. No lymphadenopathy or thyromegaly appreciated. Cor: PMI nondisplaced. Regular rate & rhythm. No rubs, gallops or murmurs. Lungs: clear Abdomen: soft, nontender, nondistended. No hepatosplenomegaly. No bruits or masses.  Good bowel sounds. Extremities: no cyanosis, clubbing, rash, edema Neuro: alert & oriented x 3, cranial nerves grossly intact. moves all 4 extremities w/o difficulty. Affect pleasant.   Telemetry   NSR 90s (personally reviewed)  Labs    CBC Recent Labs    08/29/22 0216 08/29/22 1111 08/29/22 1122 08/30/22 0619  WBC 5.6  --   --  5.1  HGB 12.4   < > 15.6* 13.6  HCT 39.3   < > 46.0 44.0  MCV 84.0  --   --  86.1  PLT 236  --   --  273   < > = values in this interval not displayed.   Basic Metabolic Panel Recent Labs    40/98/11 0216 08/29/22 1111 08/29/22 1122 08/30/22 0619  NA 135   < > 142 140  K 3.3*   < > 3.9 4.1  CL 100  --   --  102  CO2 24  --   --  27  GLUCOSE 134*  --   --  174*  BUN 10  --   --  14  CREATININE 1.13*  --   --  1.27*  CALCIUM 8.7*  --   --  9.0   < > = values in this interval not  displayed.   Liver Function Tests Recent Labs    08/29/22 0216 08/30/22 0619  AST 35 37  ALT 81* 66*  ALKPHOS 110 126  BILITOT 0.9 0.5  PROT 6.3* 6.5  ALBUMIN 3.0* 3.0*   No results for input(s): "LIPASE", "AMYLASE" in the last 72 hours.  Cardiac Enzymes No results for input(s): "CKTOTAL", "CKMB", "CKMBINDEX", "TROPONINI" in the last 72 hours.  BNP: BNP (last 3 results) Recent Labs    08/25/22 2230  BNP 2,502.5*    ProBNP (last 3 results) No results for input(s): "PROBNP" in the last 8760 hours.   D-Dimer No results for input(s): "DDIMER" in the last 72 hours. Hemoglobin A1C No results for input(s): "HGBA1C" in the last 72 hours.  Fasting Lipid Panel No results for input(s): "CHOL", "HDL", "LDLCALC", "TRIG", "CHOLHDL", "LDLDIRECT" in the last 72 hours.  Thyroid Function Tests No results for input(s): "TSH", "T4TOTAL", "T3FREE", "THYROIDAB" in the last 72 hours.  Invalid input(s): "FREET3"   Other results:   Imaging    NM Pulmonary Perf and Vent  Result Date: 08/29/2022 CLINICAL DATA:  Shortness of breath. EXAM: NUCLEAR MEDICINE  PERFUSION LUNG SCAN TECHNIQUE: Perfusion images were obtained in multiple projections after intravenous injection of radiopharmaceutical. Ventilation scans intentionally deferred if perfusion scan and chest x-ray adequate for interpretation during COVID 19 epidemic. RADIOPHARMACEUTICALS:  3.8 mCi Tc-56m MAA IV COMPARISON:  Chest x-ray earlier 08/29/2022 and older FINDINGS: No significant perfusion defects identified. Enlarged cardiac shadow. IMPRESSION: Negative perfusion only exam Electronically Signed   By: Karen Kays M.D.   On: 08/29/2022 15:10   CARDIAC CATHETERIZATION  Result Date: 08/29/2022   The left ventricular ejection fraction is less than 25% by visual estimate. Findings: Ao = 109/82 (94) LV = 105/29 RA = 6 RV = 46/8 PA = 47/24 (33) PCW = 23 Fick cardiac output/index = 3.4/2.1 PVR = 2.6 WU SVR = 1,831 FA sat = 93% PA sat = 54%, 57% Assessment: 1. Normal coronary arteries 2. Severe NICM EF < 20% 3. Elevated filling pressures with low cardiac output Plan/Discussion: Will need ongoing diuresis and titration of GDMT. Plan cMRI this admit if possible with IV iron already given. Continue to follow co-ox with PICC. Arvilla Meres, MD 11:59 AM  DG Chest 1 View  Result Date: 08/29/2022 CLINICAL DATA:  141880 SOB (shortness of breath) 141880 EXAM: CHEST  1 VIEW COMPARISON:  Chest x-ray 09/18/2004, CT chest 06/30/2022 FINDINGS: Right PICC with tip overlying the expected region of the superior cavoatrial junction. Persistent mildly enlarged cardiac silhouette. Otherwise the heart and mediastinal contours are within normal limits. Lingular linear atelectasis versus scarring. No focal consolidation. Slightly coarsened interstitial markings with no overt pulmonary edema. No pleural effusion. No pneumothorax. No acute osseous abnormality. IMPRESSION: No acute cardiopulmonary abnormality in a patient with persistent mild cardiomegaly. Electronically Signed   By: Tish Frederickson M.D.   On: 08/29/2022 10:37      Medications:     Scheduled Medications:  amitriptyline  75 mg Oral QHS   apixaban  10 mg Oral BID   Followed by   Melene Muller ON 09/05/2022] apixaban  5 mg Oral BID   Chlorhexidine Gluconate Cloth  6 each Topical Daily   dapagliflozin propanediol  10 mg Oral Daily   digoxin  0.125 mg Oral Daily   furosemide  80 mg Intravenous BID   gabapentin  300 mg Oral BID   insulin aspart  0-5 Units Subcutaneous QHS   insulin aspart  0-9 Units Subcutaneous TID  WC   pantoprazole  40 mg Oral Daily   rosuvastatin  5 mg Oral Daily   sacubitril-valsartan  1 tablet Oral BID   sodium chloride flush  10-40 mL Intracatheter Q12H   sodium chloride flush  3 mL Intravenous Q12H   spironolactone  25 mg Oral Daily    Infusions:  sodium chloride      PRN Medications: sodium chloride, acetaminophen, albuterol, fentaNYL (SUBLIMAZE) injection, hydrOXYzine, ondansetron (ZOFRAN) IV, polyethylene glycol, sodium chloride flush, sodium chloride flush    Assessment/Plan   1.  Acute on chronic systolic CHF:  Echo 5/24 EF <20%, GIIIDD, RV mod reduced, mod elevated PASP ~58, LA mod dilated, RA mildly dilated, mild MR, mod TR. NYHA III-IV at admission,  previous hx heavy alcohol use but quit a number of years ago. HIV negative.  No recent viral-type illness.  She does have CAD risk factors with family history, heavy smoking, diabetes. Not ACS-like presentation. R/LHC c/w severe NICM, demonstrating normal cors, elevated filling pressures and low output, CI 2.1, PCWP 23 >>IV diuretics restarted. Today, wt down 2 lb, CVP 8-9. Co-ox 62%. FICK CI 2.0 - Continue diuresis 80 mg IV Lasix x 1 and follow response  - Continue Spironolactone 25 mg daily  - Continue Farxiga 10 daily - Continue Entresto 24/26. BP too soft for titration  - Continue digoxin 0.125 - Needs cMRI. She did get IV Fe this admit (Ferrlecit). Will need to wait at least a week prior to conducting study. Can do as outpatient.  2.  HTN: BP controlled.  -  GDMT per above  3.  Elevated LFTs: Trending down.  Suspect hepatic congestion with CHF.  DMII: Hgb A1c 6.5 2/24 - per primary 5. Smoker: Currently heavy smoker, 2 PPD. Has smoked since she was 11. - cessation heavily encouraged 6. DVT: Left leg DVT found.  Had DVT back in 1980s found after she delivered her son.   - Hypercoagulable w/u in process, checking factor V Leiden, prothrombin gene mutation, and antiphospholipid antibody  - V/Q scan negative for significant perfusion defects  - On treatment dose Eliquis  7. Fe deficiency anemia: given IV Fe.  Check hemoccult.    Length of Stay: [address], PA-C  08/30/2022, 8:16 AM  Advanced Heart Failure Team Pager [phone] (M-F; 7a - 5p)  Please contact [facility] for night-coverage after hours (5p -7a ) and weekends on [email or web address]  Patient seen and examined with the above-signed Advanced Practice Provider and/or Housestaff. I personally reviewed laboratory data, imaging studies and relevant notes. I independently examined the patient and formulated the important aspects of the plan. I have edited the note to reflect any of my changes or salient points. I have personally discussed the plan with the patient and/or family.  L/R cath yesterday no CAD. Elevated filling pressures. Normal output  Diuresed well overnight. Feels week. No SOB, orthopnea or PND.   General:  Weak appearing. No resp difficulty HEENT: normal Neck: supple. no JVD. Carotids 2+ bilat; no bruits. No lymphadenopathy or thryomegaly appreciated. Cor: Regular rate & rhythm. No rubs, gallops or murmurs. Lungs: clear Abdomen: soft, nontender, nondistended. No hepatosplenomegaly. No bruits or masses. Good bowel sounds. Extremities: no cyanosis, clubbing, rash, edema Neuro: alert & orientedx3, cranial nerves grossly intact. moves all 4 extremities w/o difficulty. Affect pleasant  She remains tenuous. Would diurese one more day. Continue GDMT. Rec'd IV iron 5/18 and  5/19 so cannot get cMRI until 5/26 (will need to do as outpatient). Continue  DVT dose Eliquis.   Arvilla Meres, MD  11:29 AM

## 2022-08-30 NOTE — Telephone Encounter (Signed)
Patient Advocate Encounter  Prior Authorization for Entresto 24-26MG  tablets  has been approved.    PA# [id number] Insurance [employer] Electronic Prior Authorization Form  Effective dates: 08/30/2022 through 09/03/2023  Patients co-pay is $4.00.     Roland Earl, CPhT Pharmacy Patient Advocate Specialist [facility] Pharm[facility] Patient Advocate Team Direct Number: [phone]  Fax: [phone]

## 2022-08-31 ENCOUNTER — Telehealth (HOSPITAL_COMMUNITY): Payer: Self-pay | Admitting: Pharmacy Technician

## 2022-08-31 ENCOUNTER — Other Ambulatory Visit (HOSPITAL_COMMUNITY): Payer: Self-pay

## 2022-08-31 DIAGNOSIS — I509 Heart failure, unspecified: Secondary | ICD-10-CM | POA: Diagnosis not present

## 2022-08-31 LAB — GLUCOSE, CAPILLARY
Glucose-Capillary: 128 mg/dL — ABNORMAL HIGH (ref 70–99)
Glucose-Capillary: 134 mg/dL — ABNORMAL HIGH (ref 70–99)
Glucose-Capillary: 114 mg/dL — ABNORMAL HIGH (ref 70–99)

## 2022-08-31 LAB — MAGNESIUM: Magnesium: 1.9 mg/dL (ref 1.7–2.4)

## 2022-08-31 LAB — HEXAGONAL PHASE PHOSPHOLIPID: Hexagonal Phase Phospholipid: 4 s (ref 0–11)

## 2022-08-31 LAB — COOXEMETRY PANEL
Carboxyhemoglobin: 2 % — ABNORMAL HIGH (ref 0.5–1.5)
Methemoglobin: <0.7 % (ref 0.0–1.5)
O2 Saturation: 61.4 %
Total hemoglobin: 14.7 g/dL (ref 12.0–16.0)

## 2022-08-31 LAB — COMPREHENSIVE METABOLIC PANEL
ALT: 61 U/L — ABNORMAL HIGH (ref 0–44)
AST: 40 U/L (ref 15–41)
Albumin: 3.2 g/dL — ABNORMAL LOW (ref 3.5–5.0)
Alkaline Phosphatase: 117 U/L (ref 38–126)
Anion gap: 9 (ref 5–15)
BUN: 17 mg/dL (ref 6–20)
CO2: 26 mmol/L (ref 22–32)
Calcium: 9 mg/dL (ref 8.9–10.3)
Chloride: 102 mmol/L (ref 98–111)
Creatinine, Ser: 1.19 mg/dL — ABNORMAL HIGH (ref 0.44–1.00)
GFR, Estimated: 54 mL/min — ABNORMAL LOW (ref >60)
Glucose, Bld: 151 mg/dL — ABNORMAL HIGH (ref 70–99)
Potassium: 4.1 mmol/L (ref 3.5–5.1)
Sodium: 137 mmol/L (ref 135–145)
Total Bilirubin: 0.8 mg/dL (ref 0.3–1.2)
Total Protein: 6.9 g/dL (ref 6.5–8.1)

## 2022-08-31 LAB — LUPUS ANTICOAGULANT
DRVVT: 45 s (ref 0.0–47.0)
PTT Lupus Anticoagulant: 99.5 s — ABNORMAL HIGH (ref 0.0–43.5)
Thrombin Time: >150 s — ABNORMAL HIGH (ref 0.0–23.0)
dPT Confirm Ratio: 1.18 Ratio (ref 0.00–1.34)
dPT: 56 s — ABNORMAL HIGH (ref 0.0–47.6)

## 2022-08-31 LAB — CBC
HCT: 45.9 % (ref 36.0–46.0)
Hemoglobin: 14.3 g/dL (ref 12.0–15.0)
MCH: 26.6 pg (ref 26.0–34.0)
MCHC: 31.2 g/dL (ref 30.0–36.0)
MCV: 85.5 fL (ref 80.0–100.0)
Platelets: 297 10*3/uL (ref 150–400)
RBC: 5.37 MIL/uL — ABNORMAL HIGH (ref 3.87–5.11)
RDW: 16.3 % — ABNORMAL HIGH (ref 11.5–15.5)
WBC: 5.4 10*3/uL (ref 4.0–10.5)
nRBC: 4.8 % — ABNORMAL HIGH (ref 0.0–0.2)

## 2022-08-31 LAB — CARDIOLIPIN ANTIBODIES, IGG, IGM, IGA
Anticardiolipin IgA: <9 APL U/mL (ref 0–11)
Anticardiolipin IgG: <9 GPL U/mL (ref 0–14)
Anticardiolipin IgM: <9 MPL U/mL (ref 0–12)

## 2022-08-31 LAB — TT MIX+TTN
THROMBIN NEUTRALIZATION: 38.1 s — ABNORMAL HIGH (ref 0.0–23.0)
THROMBIN TIME MIX: >150 s — ABNORMAL HIGH (ref 0.0–23.0)

## 2022-08-31 LAB — PTT-LA MIX: PTT-LA Mix: 64.4 s — ABNORMAL HIGH (ref 0.0–40.5)

## 2022-08-31 MED ORDER — POLYETHYLENE GLYCOL 3350 17 GM/SCOOP PO POWD
17.0000 g | Freq: Every day | ORAL | 0 refills | Status: AC
  Filled 2022-08-31: qty 238, 14d supply, fill #0

## 2022-08-31 MED ORDER — MAGNESIUM SULFATE 2 GM/50ML IV SOLN
2.0000 g | Freq: Once | INTRAVENOUS | Status: AC
  Administered 2022-08-31: 2 g via INTRAVENOUS
  Filled 2022-08-31: qty 50

## 2022-08-31 MED ORDER — POLYETHYLENE GLYCOL 3350 17 G PO PACK
17.0000 g | PACK | Freq: Every day | ORAL | Status: DC
  Administered 2022-08-31: 17 g via ORAL
  Filled 2022-08-31: qty 1

## 2022-08-31 MED ORDER — SENNOSIDES-DOCUSATE SODIUM 8.6-50 MG PO TABS
2.0000 | ORAL_TABLET | Freq: Two times a day (BID) | ORAL | Status: DC
  Administered 2022-08-31: 2 via ORAL
  Filled 2022-08-31: qty 2

## 2022-08-31 MED ORDER — TORSEMIDE 20 MG PO TABS
20.0000 mg | ORAL_TABLET | Freq: Every day | ORAL | 0 refills | Status: DC
  Filled 2022-08-31: qty 60, 30d supply, fill #0

## 2022-08-31 MED ORDER — POTASSIUM CHLORIDE CRYS ER 20 MEQ PO TBCR
20.0000 meq | EXTENDED_RELEASE_TABLET | Freq: Every day | ORAL | 0 refills | Status: DC
  Filled 2022-08-31: qty 30, 30d supply, fill #0

## 2022-08-31 MED ORDER — APIXABAN (ELIQUIS) VTE STARTER PACK (10MG AND 5MG)
ORAL_TABLET | ORAL | 0 refills | Status: DC
  Filled 2022-08-31: qty 74, 53d supply, fill #0

## 2022-08-31 MED ORDER — DIGOXIN 125 MCG PO TABS
0.1250 mg | ORAL_TABLET | Freq: Every day | ORAL | 0 refills | Status: DC
  Filled 2022-08-31: qty 30, 30d supply, fill #0

## 2022-08-31 MED ORDER — SPIRONOLACTONE 25 MG PO TABS
25.0000 mg | ORAL_TABLET | Freq: Every day | ORAL | 0 refills | Status: DC
  Filled 2022-08-31: qty 30, 30d supply, fill #0

## 2022-08-31 MED ORDER — SACUBITRIL-VALSARTAN 24-26 MG PO TABS
1.0000 | ORAL_TABLET | Freq: Two times a day (BID) | ORAL | 0 refills | Status: DC
  Filled 2022-08-31: qty 60, 30d supply, fill #0

## 2022-08-31 MED ORDER — DAPAGLIFLOZIN PROPANEDIOL 10 MG PO TABS
10.0000 mg | ORAL_TABLET | Freq: Every day | ORAL | 0 refills | Status: DC
  Filled 2022-08-31: qty 30, 30d supply, fill #0

## 2022-08-31 NOTE — Progress Notes (Signed)
IV removed, D/C paperwork reviewed with patient. Toc meds given to patient. Patient transported home in private vehicle.

## 2022-08-31 NOTE — Plan of Care (Signed)
  Problem: Education: Goal: Ability to demonstrate management of disease process will improve Outcome: Adequate for Discharge Goal: Ability to verbalize understanding of medication therapies will improve Outcome: Adequate for Discharge Goal: Individualized Educational Video(s) Outcome: Adequate for Discharge   Problem: Activity: Goal: Capacity to carry out activities will improve Outcome: Adequate for Discharge   Problem: Health Behavior/Discharge Planning: Goal: Ability to manage health-related needs will improve Outcome: Adequate for Discharge   Problem: Metabolic: Goal: Ability to maintain appropriate glucose levels will improve Outcome: Adequate for Discharge   Problem: Activity: Goal: Ability to return to baseline activity level will improve Outcome: Adequate for Discharge

## 2022-08-31 NOTE — Discharge Summary (Signed)
Physician Discharge Summary  Kendra Griffith ZOX:[id number] DOB: [birth year] DOA: 08/25/2022  PCP: Claiborne Rigg, NP  Admit date: 08/25/2022 Discharge date: 08/31/2022  Time spent: 35 minutes  Recommendations for Outpatient Follow-up:  PCP in 1 week, needs workup for iron deficiency anemia Advanced heart failure clinic in 1 to 2 weeks, BMP in 1 week   Discharge Diagnoses:  Principal Problem: Acute systolic CHF, new diagnosis, NICM Type 2 diabetes mellitus   Depression with anxiety   Essential hypertension, benign   Chronic pain syndrome   Elevated transaminase level   Acute LLE DVT (deep venous thrombosis) (HCC)   Discharge Condition: Improved  Diet recommendation: Low-sodium heart healthy  Filed Weights   08/29/22 0617 08/30/22 0606 08/31/22 0418  Weight: 68 kg 67.1 kg 66 kg    History of present illness:  57 year old female with history of diabetes mellitus, HTN, HLP, bipolar disorder, chronic pain, depression/anxiety presented to ED with multiple complaints including loss of appetite, nausea, shortness of breath with exertion, lower extremity swelling.  Patient reported approximately 1 month of poor appetite and nausea, new onset of bilateral lower leg swelling over the past week or so.  She reported that in the last week she is having worsening exertional dyspnea and difficulty sleeping.  Denied any chest pain, abdominal pain or fevers. In ED, afebrile, O2 sats 100% on room air, BP 133/98, RR 18, pulse 95 Chest x-ray showed cardiomegaly, mildly elevated LFTs, BNP 2503  Hospital Course:   Acute systolic CHF, BiV failure -Admitted with volume overload -2D echo: EF < 20%, global hypokinesis, grade 3 DD, right ventricular systolic function moderately reduced, moderately elevated pulmonary artery systolic pressure, moderate TR. - R/L cardiac cath showed no CAD, elevated filling pressures, normal output. -VQ scan negative for PE, -Diuresed with IV Lasix, volume status has  improved, followed by heart failure team this admission -Continue Entresto Farxiga, Aldactone, digoxin and torsemide 20 Mg alternating with 40 Mg daily -Plan for cardiac MRI as outpatient as she received IV iron this admission -Discharged home in stable condition, follow-up with heart failure clinic in 1 to 2 weeks, BMP in 1 week  Acute Left LE DVT in popliteal and peroneal vein: - Venous Doppler + ac DVT -VQ scan negative for PE, transitioned to apixaban    Elevated transaminase level -Likely due to #1, congestive hepatopathy.  Acute hepatitis panel negative -RUQ ultrasound showed gallbladder sludge without sonographic evidence of acute cholecystitis -LFTs improving  Iron deficiency anemia -Given IV iron, Hemoccults not checked, she did not have a BM -Discharged home on laxatives, resume oral iron therapy with laxative after BM, needs GI eval for iron deficiency     Essential hypertension, benign -BP stable     Chronic pain syndrome -Continue amitriptyline, gabapentin   Diet-controlled diabetes mellitus -Hemoglobin A1c 6.1 on 08/27/2022 -Now on Farxiga   Depression with anxiety -Continue amitriptyline, hydroxyzine as needed   Consultations: Advanced heart failure team  Discharge Exam: Vitals:   08/31/22 0418 08/31/22 0732  BP: 93/79 95/63  Pulse: 100 93  Resp: 18 17  Temp: 98.3 F (36.8 C) 97.8 F (36.6 C)  SpO2: 95% 98%   Gen: Awake, Alert, Oriented X 3,  HEENT: no JVD Lungs: Good air movement bilaterally, CTAB CVS: S1S2/RRR Abd: soft, Non tender, non distended, BS present Extremities: No edema Skin: no new rashes on exposed skin   Discharge Instructions   Discharge Instructions     Ambulatory referral to Cardiology   Complete by: As directed  If you have not heard from the Cardiology office within the next 72 hours please call [phone].   Diet - low sodium heart healthy   Complete by: As directed    Diet Carb Modified   Complete by: As  directed    Increase activity slowly   Complete by: As directed       Allergies as of 08/31/2022       Reactions   Amoxicillin    Pain Did it involve swelling of the face/tongue/throat, SOB, or low BP? No Did it involve sudden or severe rash/hives, skin peeling, or any reaction on the inside of your mouth or nose? No Did you need to seek medical attention at a hospital or doctor's office? Yes When did it last happen?      5 years ago If all above answers are "NO", may proceed with cephalosporin use.   Darvocet [propoxyphene N-acetaminophen] Nausea And Vomiting   Makes her jittery   Hydrocodone-acetaminophen Nausea And Vomiting   Upset stomach   Percocet [oxycodone-acetaminophen]    Makes her jittery   Valium [diazepam] Other (See Comments)   Makes pt. Feel "out of wack"        Medication List     STOP taking these medications    gentamicin 0.3 % ophthalmic solution Commonly known as: GARAMYCIN   Hibiclens 4 % external liquid Generic drug: chlorhexidine   hydroquinone 4 % cream   ibuprofen 800 MG tablet Commonly known as: ADVIL   losartan 25 MG tablet Commonly known as: COZAAR   nicotine 21 mg/24hr patch Commonly known as: NICODERM CQ - dosed in mg/24 hours   nicotine polacrilex 4 MG lozenge Commonly known as: Nicotine Mini   NONFORMULARY OR COMPOUNDED ITEM       TAKE these medications    acetaminophen-codeine 300-30 MG tablet Commonly known as: TYLENOL #3 Take 1-2 tablets by mouth every 4 (four) hours as needed for moderate pain.   albuterol 108 (90 Base) MCG/ACT inhaler Commonly known as: VENTOLIN HFA Inhale 2 puffs into the lungs every 6 (six) hours as needed for wheezing or shortness of breath.   amitriptyline 75 MG tablet Commonly known as: ELAVIL Take 1 tablet (75 mg total) by mouth at bedtime for pain and depression   Apixaban Starter Pack (10mg  and 5mg ) Commonly known as: ELIQUIS STARTER PACK Take as directed on package: start with  two-5mg  tablets twice daily for 7 days. On day 8, switch to one-5mg  tablet twice daily.   ascorbic acid 500 MG tablet Commonly known as: VITAMIN C Take 500 mg by mouth daily.   Biotin 5 MG Caps Take 5 mg by mouth 3 (three) times daily.   cholecalciferol 25 MCG (1000 UNIT) tablet Commonly known as: VITAMIN D3 Take 1,000 Units by mouth daily.   dapagliflozin propanediol 10 MG Tabs tablet Commonly known as: FARXIGA Take 1 tablet (10 mg total) by mouth daily. Start taking on: Sep 01, 2022   digoxin 0.125 MG tablet Commonly known as: LANOXIN Take 1 tablet (0.125 mg total) by mouth daily. Start taking on: Sep 01, 2022   famotidine 20 MG tablet Commonly known as: PEPCID Take 1 tablet (20 mg total) by mouth 2 (two) times daily for heartburn   gabapentin 300 MG capsule Commonly known as: NEURONTIN Take 1 capsule (300 mg total) by mouth 2 (two) times daily.   GLUTAMINE PO Take 500 mg by mouth daily.   hydrOXYzine 25 MG tablet Commonly known as: ATARAX Take 1 tablet (25  mg total) by mouth 3 (three) times daily as needed. What changed: reasons to take this   Misc. Devices Misc Please provide patient with incontinence pads or disposable underwear or choice. N39.3   omeprazole 20 MG capsule Commonly known as: PRILOSEC Take 1 capsule (20 mg total) by mouth daily for heartburn   polyethylene glycol 17 g packet Commonly known as: MIRALAX / GLYCOLAX Take 17 g by mouth daily for 10 days. Start taking on: Sep 01, 2022   potassium chloride SA 20 MEQ tablet Commonly known as: KLOR-CON M Take 1 tablet (20 mEq total) by mouth daily.   rosuvastatin 5 MG tablet Commonly known as: CRESTOR Take 1 tablet (5 mg total) by mouth daily for cholesterol   sacubitril-valsartan 24-26 MG Commonly known as: ENTRESTO Take 1 tablet by mouth 2 (two) times daily.   spironolactone 25 MG tablet Commonly known as: ALDACTONE Take 1 tablet (25 mg total) by mouth daily. Start taking on: Sep 01, 2022   torsemide 20 MG tablet Commonly known as: DEMADEX Take 1-2 tablets (20-40 mg total) by mouth daily. Torsemide alternating between 20 and 40 mg every other day   vitamin B-12 500 MCG tablet Commonly known as: CYANOCOBALAMIN Take 500 mcg by mouth daily.       Allergies  Allergen Reactions   Amoxicillin     Pain Did it involve swelling of the face/tongue/throat, SOB, or low BP? No Did it involve sudden or severe rash/hives, skin peeling, or any reaction on the inside of your mouth or nose? No Did you need to seek medical attention at a hospital or doctor's office? Yes When did it last happen?      5 years ago If all above answers are "NO", may proceed with cephalosporin use.    Darvocet [Propoxyphene N-Acetaminophen] Nausea And Vomiting    Makes her jittery   Hydrocodone-Acetaminophen Nausea And Vomiting    Upset stomach   Percocet [Oxycodone-Acetaminophen]     Makes her jittery   Valium [Diazepam] Other (See Comments)    Makes pt. Feel "out of wack"    Follow-up Information     Claiborne Rigg, NP .   Specialty: Nurse Practitioner Contact information: [address] [place] [address] [phone]         [facility] Follow up.   Specialty: Cardiology Why: 09/07/22 at 12:00 PM   Hospital Follow-up in the Advanced Heart Failure Clinic at [facility], Entrance [address]. Parking Garage Code 1995 Contact information: [address] [id number] [place] [address] [phone]                 The results of significant diagnostics from this hospitalization (including imaging, microbiology, ancillary and laboratory) are listed below for reference.    Significant Diagnostic Studies: NM Pulmonary Perf and Vent  Result Date: 08/29/2022 CLINICAL DATA:  Shortness of breath. EXAM: NUCLEAR MEDICINE PERFUSION LUNG SCAN TECHNIQUE: Perfusion images were obtained in multiple  projections after intravenous injection of radiopharmaceutical. Ventilation scans intentionally deferred if perfusion scan and chest x-ray adequate for interpretation during COVID 19 epidemic. RADIOPHARMACEUTICALS:  3.8 mCi Tc-17m MAA IV COMPARISON:  Chest x-ray earlier 08/29/2022 and older FINDINGS: No significant perfusion defects identified. Enlarged cardiac shadow. IMPRESSION: Negative perfusion only exam Electronically Signed   By: Karen Kays M.D.   On: 08/29/2022 15:10   CARDIAC CATHETERIZATION  Result Date: 08/29/2022   The left ventricular ejection fraction is less  than 25% by visual estimate. Findings: Ao = 109/82 (94) LV = 105/29 RA = 6 RV = 46/8 PA = 47/24 (33) PCW = 23 Fick cardiac output/index = 3.4/2.1 PVR = 2.6 WU SVR = 1,831 FA sat = 93% PA sat = 54%, 57% Assessment: 1. Normal coronary arteries 2. Severe NICM EF < 20% 3. Elevated filling pressures with low cardiac output Plan/Discussion: Will need ongoing diuresis and titration of GDMT. Plan cMRI this admit if possible with IV iron already given. Continue to follow co-ox with PICC. Arvilla Meres, MD 11:59 AM  DG Chest 1 View  Result Date: 08/29/2022 CLINICAL DATA:  141880 SOB (shortness of breath) 141880 EXAM: CHEST  1 VIEW COMPARISON:  Chest x-ray 09/18/2004, CT chest 06/30/2022 FINDINGS: Right PICC with tip overlying the expected region of the superior cavoatrial junction. Persistent mildly enlarged cardiac silhouette. Otherwise the heart and mediastinal contours are within normal limits. Lingular linear atelectasis versus scarring. No focal consolidation. Slightly coarsened interstitial markings with no overt pulmonary edema. No pleural effusion. No pneumothorax. No acute osseous abnormality. IMPRESSION: No acute cardiopulmonary abnormality in a patient with persistent mild cardiomegaly. Electronically Signed   By: Tish Frederickson M.D.   On: 08/29/2022 10:37   VAS [place] LOWER EXTREMITY VENOUS (DVT)  Result Date: 08/28/2022  Lower  Venous DVT Study Patient Name:  Kendra Griffith  Date of Exam:   08/27/2022 Medical Rec #: [id number]        Accession #:    [id number] Date of Birth: 01/26/1966        Patient Gender: F Patient Age:   51 years Exam Location:  [facility] Procedure:      VAS [place] LOWER EXTREMITY VENOUS (DVT) Referring Phys: RIPUDEEP RAI --------------------------------------------------------------------------------  Indications: Pain, Swelling, and SOB.  Risk Factors: EF >20%. Comparison Study: No prior study on file Performing Technologist: Sherren Kerns RVS  Examination Guidelines: A complete evaluation includes B-mode imaging, spectral Doppler, color Doppler, and power Doppler as needed of all accessible portions of each vessel. Bilateral testing is considered an integral part of a complete examination. Limited examinations for reoccurring indications may be performed as noted. The reflux portion of the exam is performed with the patient in reverse Trendelenburg.  +--------+---------------+---------+-----------+----------------+-------------+ RIGHT   CompressibilityPhasicitySpontaneityProperties      Thrombus                                                                 Aging         +--------+---------------+---------+-----------+----------------+-------------+ CFV     Full                               pulsatile                                                                waveforms                     +--------+---------------+---------+-----------+----------------+-------------+ SFJ     Full                                                             +--------+---------------+---------+-----------+----------------+-------------+  FV Prox Full                                                             +--------+---------------+---------+-----------+----------------+-------------+ FV Mid  Full                               pulsatile                                                                 waveforms                     +--------+---------------+---------+-----------+----------------+-------------+ FV      Full                                                             Distal                                                                   +--------+---------------+---------+-----------+----------------+-------------+ PFV     Full                                                             +--------+---------------+---------+-----------+----------------+-------------+ POP     Full                               pulsatile                                                                waveforms                     +--------+---------------+---------+-----------+----------------+-------------+ PTV     Full                                                             +--------+---------------+---------+-----------+----------------+-------------+ PERO    Full                                                             +--------+---------------+---------+-----------+----------------+-------------+   +--------+---------------+---------+-----------+----------------+-------------+  LEFT    CompressibilityPhasicitySpontaneityProperties      Thrombus                                                                 Aging         +--------+---------------+---------+-----------+----------------+-------------+ CFV     Full                               pulsatile                                                                waveforms                     +--------+---------------+---------+-----------+----------------+-------------+ SFJ     Full                                                             +--------+---------------+---------+-----------+----------------+-------------+ FV Prox Full                                                              +--------+---------------+---------+-----------+----------------+-------------+ FV Mid  Full                               pulsatile                                                                waveforms                     +--------+---------------+---------+-----------+----------------+-------------+ FV      Full                                                             Distal                                                                   +--------+---------------+---------+-----------+----------------+-------------+ PFV     Full                                                             +--------+---------------+---------+-----------+----------------+-------------+  POP     None                               pulsatile                                                                waveforms                     +--------+---------------+---------+-----------+----------------+-------------+ PTV     Full                                                             +--------+---------------+---------+-----------+----------------+-------------+ PERO    None                                                             +--------+---------------+---------+-----------+----------------+-------------+     Summary: RIGHT: - There is no evidence of deep vein thrombosis in the lower extremity.  pulsatile waveforms suggestive of fluid overload  LEFT: - Findings consistent with acute deep vein thrombosis involving the left popliteal vein, and left peroneal veins. Pulsatile waveforms suggestive of fluid overload.  *See table(s) above for measurements and observations. Electronically signed by Gerarda Fraction on 08/28/2022 at 8:33:45 AM.    Final    [place] EKG SITE RITE  Result Date: 08/26/2022 If Site Rite image not attached, placement could not be confirmed due to current cardiac rhythm.  ECHOCARDIOGRAM COMPLETE  Result Date: 08/26/2022    ECHOCARDIOGRAM REPORT    Patient Name:   Kendra Griffith Date of Exam: 08/26/2022 Medical Rec #:  [id number]       Height:       65.0 in Accession #:    [id number]      Weight:       161.6 lb Date of Birth:  October 19, 1965       BSA:          1.807 m Patient Age:    56 years        BP:           130/99 mmHg Patient Gender: F               HR:           98 bpm. Exam Location:  Inpatient Procedure: 2D Echo, Cardiac Doppler, Color Doppler and Intracardiac            Opacification Agent Indications:    Dyspnea  History:        Patient has no prior history of Echocardiogram examinations.                 Risk Factors:Hypertension and Diabetes.  Sonographer:    Darlys Gales Referring Phys: [id number] TIMOTHY S OPYD IMPRESSIONS  1. Left ventricular ejection fraction, by estimation, is <20%. The left ventricle has severely decreased function.  The left ventricle demonstrates global hypokinesis. The left ventricular internal cavity size was moderately dilated. Left ventricular diastolic parameters are consistent with Grade III diastolic dysfunction (restrictive).  2. Right ventricular systolic function is moderately reduced. The right ventricular size is normal. There is moderately elevated pulmonary artery systolic pressure. The estimated right ventricular systolic pressure is 58.0 mmHg.  3. Left atrial size was moderately dilated.  4. Right atrial size was mildly dilated.  5. The mitral valve is normal in structure. Mild mitral valve regurgitation. No evidence of mitral stenosis.  6. Tricuspid valve regurgitation is moderate.  7. The aortic valve is tricuspid. There is mild calcification of the aortic valve. Aortic valve regurgitation is not visualized. No aortic stenosis is present.  8. The inferior vena cava is dilated in size with <50% respiratory variability, suggesting right atrial pressure of 15 mmHg. FINDINGS  Left Ventricle: Left ventricular ejection fraction, by estimation, is <20%. The left ventricle has severely decreased function. The left  ventricle demonstrates global hypokinesis. Definity contrast agent was given IV to delineate the left ventricular endocardial borders. The left ventricular internal cavity size was moderately dilated. There is no left ventricular hypertrophy. Left ventricular diastolic parameters are consistent with Grade III diastolic dysfunction (restrictive). Right Ventricle: The right ventricular size is normal. No increase in right ventricular wall thickness. Right ventricular systolic function is moderately reduced. There is moderately elevated pulmonary artery systolic pressure. The tricuspid regurgitant velocity is 3.28 m/s, and with an assumed right atrial pressure of 15 mmHg, the estimated right ventricular systolic pressure is 58.0 mmHg. Left Atrium: Left atrial size was moderately dilated. Right Atrium: Right atrial size was mildly dilated. Pericardium: There is no evidence of pericardial effusion. Mitral Valve: The mitral valve is normal in structure. Mild mitral valve regurgitation. No evidence of mitral valve stenosis. Tricuspid Valve: The tricuspid valve is normal in structure. Tricuspid valve regurgitation is moderate . No evidence of tricuspid stenosis. Aortic Valve: The aortic valve is tricuspid. There is mild calcification of the aortic valve. Aortic valve regurgitation is not visualized. No aortic stenosis is present. Aortic valve mean gradient measures 2.0 mmHg. Aortic valve peak gradient measures 3.4 mmHg. Aortic valve area, by VTI measures 1.66 cm. Pulmonic Valve: The pulmonic valve was normal in structure. Pulmonic valve regurgitation is mild. No evidence of pulmonic stenosis. Aorta: The aortic root is normal in size and structure. Venous: The inferior vena cava is dilated in size with less than 50% respiratory variability, suggesting right atrial pressure of 15 mmHg. IAS/Shunts: No atrial level shunt detected by color flow Doppler.  LEFT VENTRICLE PLAX 2D LVIDd:         5.75 cm      Diastology LVIDs:          5.50 cm      LV e' medial:    2.72 cm/s LV PW:         0.85 cm      LV E/e' medial:  30.8 LV IVS:        0.50 cm      LV e' lateral:   9.79 cm/s LVOT diam:     1.80 cm      LV E/e' lateral: 8.6 LV SV:         20 LV SV Index:   11 LVOT Area:     2.54 cm  LV Volumes (MOD) LV vol d, MOD A4C: 124.0 ml LV vol s, MOD A4C: 98.0 ml LV SV MOD A4C:  124.0 ml RIGHT VENTRICLE RV S prime:     8.27 cm/s TAPSE (M-mode): 0.6 cm LEFT ATRIUM           Index        RIGHT ATRIUM           Index LA Vol (A4C): 83.6 ml 46.24 ml/m  RA Area:     16.30 cm                                    RA Volume:   39.90 ml  22.08 ml/m  AORTIC VALVE AV Area (Vmax):    2.11 cm AV Area (Vmean):   1.75 cm AV Area (VTI):     1.66 cm AV Vmax:           91.60 cm/s AV Vmean:          70.600 cm/s AV VTI:            0.119 m AV Peak Grad:      3.4 mmHg AV Mean Grad:      2.0 mmHg LVOT Vmax:         75.80 cm/s LVOT Vmean:        48.600 cm/s LVOT VTI:          0.078 m LVOT/AV VTI ratio: 0.65  AORTA Ao Root diam: 2.10 cm Ao Asc diam:  2.50 cm MITRAL VALVE               TRICUSPID VALVE MV Area (PHT): 4.77 cm    TR Peak grad:   43.0 mmHg MV Decel Time: 159 msec    TR Vmax:        328.00 cm/s MV E velocity: 83.80 cm/s                            SHUNTS                            Systemic VTI:  0.08 m                            Systemic Diam: 1.80 cm Arvilla Meres MD Electronically signed by Arvilla Meres MD Signature Date/Time: 08/26/2022/10:15:44 AM    Final    US Abdomen Limited RUQ (LIVER/GB)  Result Date: 08/26/2022 CLINICAL DATA:  Elevated liver function tests EXAM: ULTRASOUND ABDOMEN LIMITED RIGHT UPPER QUADRANT COMPARISON:  None Available. FINDINGS: Gallbladder: Layering sludge is seen within the gallbladder. The gallbladder, however, is not distended, there is no gallbladder wall thickening, and no pericholecystic fluid is identified. The sonographic Eulah Pont sign is reportedly negative. Common bile duct: Diameter: 4 mm in proximal diameter  Liver: No focal lesion identified. Within normal limits in parenchymal echogenicity. Portal vein is patent on color Doppler imaging with normal direction of blood flow towards the liver. Other: None. IMPRESSION: 1. Gallbladder sludge without sonographic evidence of acute cholecystitis. Electronically Signed   By: Helyn Numbers M.D.   On: 08/26/2022 02:02   DG Chest 2 View  Result Date: 08/25/2022 CLINICAL DATA:  Cough EXAM: CHEST - 2 VIEW COMPARISON:  12/10/2017 FINDINGS: Interval development of mild cardiomegaly. Mild right basilar atelectasis. No pneumothorax or pleural effusion. Pulmonary vascularity is normal. No acute bone abnormality. IMPRESSION: 1. Mild cardiomegaly. 2. Mild right basilar atelectasis. Electronically  Signed   By: Helyn Numbers M.D.   On: 08/25/2022 21:33   CT Head Wo Contrast  Result Date: 08/25/2022 CLINICAL DATA:  Transient slurred speech. EXAM: CT HEAD WITHOUT CONTRAST TECHNIQUE: Contiguous axial images were obtained from the base of the skull through the vertex without intravenous contrast. RADIATION DOSE REDUCTION: This exam was performed according to the departmental dose-optimization program which includes automated exposure control, adjustment of the mA and/or kV according to patient size and/or use of iterative reconstruction technique. COMPARISON:  MRI examination dated May 05, 2022 FINDINGS: Brain: No evidence of acute infarction, hemorrhage, hydrocephalus, extra-axial collection or mass lesion/mass effect. Vascular: No hyperdense vessel or unexpected calcification. Skull: Normal. Negative for fracture or focal lesion. Sinuses/Orbits: No acute finding. Other: None. IMPRESSION: No acute intracranial pathology. Electronically Signed   By: Larose Hires D.O.   On: 08/25/2022 18:09    Microbiology: Recent Results (from the past 240 hour(s))  Urine Culture (for pregnant, neutropenic or urologic patients or patients with an indwelling urinary catheter)     Status:  Abnormal   Collection Time: 08/26/22  1:15 PM   Specimen: Urine, Clean Catch  Result Value Ref Range Status   Specimen Description URINE, CLEAN CATCH  Final   Special Requests   Final    NONE Performed at [facility], [address] [place], [place] [address]    Culture MULTIPLE SPECIES PRESENT, SUGGEST RECOLLECTION (A)  Final   Report Status 08/28/2022 FINAL  Final     Labs: Basic Metabolic Panel: Recent Labs  Lab 08/27/22 0645 08/28/22 0412 08/29/22 0216 08/29/22 1111 08/29/22 1118 08/29/22 1122 08/30/22 0619 08/31/22 0437  NA 141 139 135 146* 143 142 140 137  K 3.8 3.1* 3.3* 3.4* 3.8 3.9 4.1 4.1  CL 103 101 100  --   --   --  102 102  CO2 25 25 24   --   --   --  27 26  GLUCOSE 120* 163* 134*  --   --   --  174* 151*  BUN 12 9 10   --   --   --  14 17  CREATININE 1.34* 1.14* 1.13*  --   --   --  1.27* 1.19*  CALCIUM 9.3 8.7* 8.7*  --   --   --  9.0 9.0  MG 1.6*  --   --   --   --   --   --  1.9   Liver Function Tests: Recent Labs  Lab 08/27/22 0645 08/28/22 0412 08/29/22 0216 08/30/22 0619 08/31/22 0437  AST 56* 43* 35 37 40  ALT 119* 95* 81* 66* 61*  ALKPHOS 86 100 110 126 117  BILITOT 0.8 1.0 0.9 0.5 0.8  PROT 6.4* 6.0* 6.3* 6.5 6.9  ALBUMIN 3.0* 2.9* 3.0* 3.0* 3.2*   Recent Labs  Lab 08/25/22 1651  LIPASE 34   No results for input(s): "AMMONIA" in the last 168 hours. CBC: Recent Labs  Lab 08/25/22 1651 08/26/22 0043 08/27/22 0735 08/28/22 0412 08/29/22 0216 08/29/22 1111 08/29/22 1118 08/29/22 1122 08/30/22 0619 08/31/22 0437  WBC 3.8*   < > 5.5 5.2 5.6  --   --   --  5.1 5.4  NEUTROABS 2.8  --   --   --   --   --   --   --   --   --   HGB 11.3*   < > 11.0* 11.6* 12.4 14.3 15.6* 15.6* 13.6 14.3  HCT 37.8   < >  35.1* 36.4 39.3 42.0 46.0 46.0 44.0 45.9  MCV 89.4   < > 84.6 84.7 84.0  --   --   --  86.1 85.5  PLT 198   < > 202 230 236  --   --   --  273 297   < > = values in this interval not displayed.   Cardiac Enzymes: No  results for input(s): "CKTOTAL", "CKMB", "CKMBINDEX", "TROPONINI" in the last 168 hours. BNP: BNP (last 3 results) Recent Labs    08/25/22 2230  BNP 2,502.5*    ProBNP (last 3 results) No results for input(s): "PROBNP" in the last 8760 hours.  CBG: Recent Labs  Lab 08/30/22 1154 08/30/22 1705 08/30/22 2102 08/31/22 0620 08/31/22 1055  GLUCAP 188* 178* 138* 128* 134*       Signed:  Zannie Cove MD.  [facility] 08/31/2022, 12:46 PM

## 2022-08-31 NOTE — Telephone Encounter (Signed)
Patient Advocate Encounter  Prior Authorization for Marcelline Deist 10MG  tablets  has been approved.    PA# [id number] Insurance  [employer] Electronic Prior Authorization Form  Effective dates: 08/31/2022 through 08/31/2023      Roland Earl, CPhT Pharmacy Patient Advocate Specialist [facility] Patient Advocate Team Direct Number: [phone]  Fax: [phone]

## 2022-08-31 NOTE — Progress Notes (Addendum)
Patient ID: Kendra Griffith, female   DOB: 1965-11-08, 57 y.o.   MRN: [id number]     Advanced Heart Failure Rounding Note  PCP-Cardiologist: Dr. Shirlee Latch (new)  Subjective:    5/20: R/LHC c/w severe NICM, elevated filling pressures and low output, CI 2.1 >>IV diuretics restarted   Co-ox 61% today. CVP 5. Renal fx stable, SCr 1.19    Feels good today. Denies dyspnea. Went for walk earlier this morning w/o exertional limitation. Still constipated. No BM yet.     CV Studies   2D Echo: EF < 20%, GIIDD, RV mod reduced   R/LHC: Ao = 109/82 (94) LV = 105/29 RA = 6 RV = 46/8 PA = 47/24 (33) PCW = 23 Fick cardiac output/index = 3.4/2.1 PVR = 2.6 WU SVR = 1,831 FA sat = 93% PA sat = 54%, 57%   Assessment: 1. Normal coronary arteries 2. Severe NICM EF < 20% 3. Elevated filling pressures with low cardiac output    LE venous dopplers + for LLE DVT  V/Q scan - for PE     Objective:   Weight Range: 66 kg Body mass index is 24.2 kg/m.   Vital Signs:   Temp:  [97.5 F (36.4 C)-98.3 F (36.8 C)] 97.8 F (36.6 C) (05/22 0732) Pulse Rate:  [93-103] 93 (05/22 0732) Resp:  [16-20] 17 (05/22 0732) BP: (90-100)/(63-79) 95/63 (05/22 0732) SpO2:  [92 %-98 %] 98 % (05/22 0732) Weight:  [66 kg] 66 kg (05/22 0418) Last BM Date : 08/24/22  Weight change: Filed Weights   08/29/22 0617 08/30/22 0606 08/31/22 0418  Weight: 68 kg 67.1 kg 66 kg    Intake/Output:   Intake/Output Summary (Last 24 hours) at 08/31/2022 1034 Last data filed at 08/30/2022 1700 Gross per 24 hour  Intake --  Output 700 ml  Net -700 ml      Physical Exam   CVP 5  General:  Well appearing. No respiratory difficulty HEENT: normal Neck: supple. no JVD. Carotids 2+ bilat; no bruits. No lymphadenopathy or thyromegaly appreciated. Cor: PMI nondisplaced. Regular rate & rhythm. No rubs, gallops or murmurs. Lungs: clear Abdomen: soft, nontender, nondistended. No hepatosplenomegaly. No bruits or masses.  Good bowel sounds. Extremities: no cyanosis, clubbing, rash, edema + RUE PICC  Neuro: alert & oriented x 3, cranial nerves grossly intact. moves all 4 extremities w/o difficulty. Affect pleasant.  Telemetry   NSR 90s (personally reviewed)  Labs    CBC Recent Labs    08/30/22 0619 08/31/22 0437  WBC 5.1 5.4  HGB 13.6 14.3  HCT 44.0 45.9  MCV 86.1 85.5  PLT 273 297   Basic Metabolic Panel Recent Labs    40/98/11 0619 08/31/22 0437  NA 140 137  K 4.1 4.1  CL 102 102  CO2 27 26  GLUCOSE 174* 151*  BUN 14 17  CREATININE 1.27* 1.19*  CALCIUM 9.0 9.0  MG  --  1.9   Liver Function Tests Recent Labs    08/30/22 0619 08/31/22 0437  AST 37 40  ALT 66* 61*  ALKPHOS 126 117  BILITOT 0.5 0.8  PROT 6.5 6.9  ALBUMIN 3.0* 3.2*   No results for input(s): "LIPASE", "AMYLASE" in the last 72 hours.  Cardiac Enzymes No results for input(s): "CKTOTAL", "CKMB", "CKMBINDEX", "TROPONINI" in the last 72 hours.  BNP: BNP (last 3 results) Recent Labs    08/25/22 2230  BNP 2,502.5*    ProBNP (last 3 results) No results for input(s): "PROBNP" in the  last 8760 hours.   D-Dimer No results for input(s): "DDIMER" in the last 72 hours. Hemoglobin A1C No results for input(s): "HGBA1C" in the last 72 hours.  Fasting Lipid Panel No results for input(s): "CHOL", "HDL", "LDLCALC", "TRIG", "CHOLHDL", "LDLDIRECT" in the last 72 hours.  Thyroid Function Tests No results for input(s): "TSH", "T4TOTAL", "T3FREE", "THYROIDAB" in the last 72 hours.  Invalid input(s): "FREET3"   Other results:   Imaging    No results found.   Medications:     Scheduled Medications:  amitriptyline  75 mg Oral QHS   apixaban  10 mg Oral BID   Followed by   Melene Muller ON 09/05/2022] apixaban  5 mg Oral BID   Chlorhexidine Gluconate Cloth  6 each Topical Daily   dapagliflozin propanediol  10 mg Oral Daily   digoxin  0.125 mg Oral Daily   gabapentin  300 mg Oral BID   insulin aspart  0-5  Units Subcutaneous QHS   insulin aspart  0-9 Units Subcutaneous TID WC   pantoprazole  40 mg Oral Daily   rosuvastatin  5 mg Oral Daily   sacubitril-valsartan  1 tablet Oral BID   senna-docusate  2 tablet Oral QHS   sodium chloride flush  10-40 mL Intracatheter Q12H   sodium chloride flush  3 mL Intravenous Q12H   spironolactone  25 mg Oral Daily    Infusions:  sodium chloride     magnesium sulfate bolus IVPB      PRN Medications: sodium chloride, acetaminophen, albuterol, fentaNYL (SUBLIMAZE) injection, hydrOXYzine, ondansetron (ZOFRAN) IV, polyethylene glycol, sodium chloride flush, sodium chloride flush    Assessment/Plan   1.  Acute on chronic systolic CHF:  Echo 5/24 EF <20%, GIIIDD, RV mod reduced, mod elevated PASP ~58, LA mod dilated, RA mildly dilated, mild MR, mod TR. NYHA III-IV at admission,  previous hx heavy alcohol use but quit a number of years ago. HIV negative.  No recent viral-type illness.  She does have CAD risk factors with family history, heavy smoking, diabetes. Not ACS-like presentation. R/LHC c/w severe NICM, demonstrating normal cors, elevated filling pressures and low output, CI 2.1, PCWP 23 >>IV diuretics restarted. Has diuresed well. CVP 5. Dyspnea resolved. Co-ox 61%. - Continue Spironolactone 25 mg daily  - Continue Farxiga 10 daily - Continue Entresto 24/26. BP too soft for titration  - Continue digoxin 0.125 - Transition to torsemide alternating between 20 and 40 mg qod  - Needs cMRI. She did get IV Fe this admit (Ferrlecit). Will need to wait at least a week prior to conducting study (after 5/26). Can do as outpatient.  2.  HTN: BP controlled.  - GDMT per above  3.  Elevated LFTs: Trending down.  Suspect hepatic congestion with CHF.  DMII: Hgb A1c 6.5 2/24 - per primary 5. Smoker: Currently heavy smoker, 2 PPD. Has smoked since she was 11. - cessation heavily encouraged 6. DVT: Left leg DVT found.  Had DVT back in 1980s found after she delivered  her son.   - Hypercoagulable w/u in process, checking factor V Leiden, prothrombin gene mutation, and antiphospholipid antibody >>will need f/u w/ PCP - V/Q scan negative for significant perfusion defects  - On treatment dose Eliquis  7. Fe deficiency anemia: given IV Fe.   -  unable to check hemoccult as pt has not had BM -  will need this followed by PCP   [facility] for d/c home today. We will arrange close f/u in the [facility] and will  place APP info in AVS.   Cardiac Meds for Discharge  Eliquis 10 mg bid, through 5/27, then reduce to 5 mg bid thereafter Farxiga 10 mg daily  Digoxin 0.125 mg daily  Rosuvastatin 5 mg daily  Entresto 24-26 mg bid  Spironolactone 25 mg daily  Torsemide alternating between 20 and 40 mg qod  KCl 20 mEq daily   Will need f/u BMP in 1 wk     Length of Stay: [name] PA-C  08/31/2022, 10:34 AM  Advanced Heart Failure Team Pager [phone] (M-F; 7a - 5p)  Please contact [facility] for night-coverage after hours (5p -7a ) and weekends on [email or web address]  Patient seen and examined with the above-signed Advanced Practice Provider and/or Housestaff. I personally reviewed laboratory data, imaging studies and relevant notes. I independently examined the patient and formulated the important aspects of the plan. I have edited the note to reflect any of my changes or salient points. I have personally discussed the plan with the patient and/or family.  Looks good. Denies CP or SOB. VOlume status improved  General:  Well appearing. No resp difficulty HEENT: normal Neck: supple. no JVD. Carotids 2+ bilat; no bruits. No lymphadenopathy or thryomegaly appreciated. Cor: PMI nondisplaced. Regular rate & rhythm. No rubs, gallops or murmurs. Lungs: clear Abdomen: soft, nontender, nondistended. No hepatosplenomegaly. No bruits or masses. Good bowel sounds. Extremities: no cyanosis, clubbing, rash, edema Neuro: alert & orientedx3, cranial nerves grossly intact. moves  all 4 extremities w/o difficulty. Affect pleasant  Plan home today on above HF meds. Can decrease torsemide as needed. We will see next week in HF Clinic and arrange cMRI.   Arvilla Meres, MD  4:42 PM

## 2022-08-31 NOTE — TOC Progression Note (Signed)
Transition of Care [facility]) - Progression Note    Patient Details  Name: Kendra Griffith MRN: [id number] Date of Birth: 1965/09/24  [facility] of [facility]) CM/SW Contact  Elliot Cousin, RN Phone Number: [phone] 08/31/2022, 1:10 PM  Clinical Narrative:   HF TOC CM spoke to pt and states she has wheelchair, Rolling walker, scale and cane at home. States her son will provide transportation home today. Pt states she has [facility] Medicare and Medicaid transportation. Referral sent to HF CSW to assist with transportation if she is unable to utilize her transportation on next week. Educated pt on importance of daily weights.   Pt has follow up appt with Dr Sharon Seller in [facility] clinic, PCP hospital follow up on 09/08/2022 at 1050 am.     Expected Discharge Plan: Home/Self Care Barriers to Discharge: No Barriers Identified  Expected Discharge Plan and Services   Discharge Planning Services: CM Consult   Living arrangements for the past 2 months: Single Family Home Expected Discharge Date: 08/31/22                   Social Determinants of Health (SDOH) Interventions SDOH Screenings   Food Insecurity: No Food Insecurity (09/04/2019)  Housing: Low Risk  (08/30/2022)  Transportation Needs: Unmet Transportation Needs (09/04/2019)  Depression (PHQ2-9): High Risk (08/17/2022)  Physical Activity: Inactive (06/08/2017)  Social Connections: Socially Isolated (06/08/2017)  Stress: No Stress Concern Present (06/08/2017)  Tobacco Use: High Risk (08/30/2022)    Readmission Risk Interventions     No data to display

## 2022-08-31 NOTE — Telephone Encounter (Signed)
Patient Advocate Encounter   Received notification that prior authorization for Farxiga 10MG  tablets is required.   PA submitted on 08/31/2022 Key [id number] Insurance [employer] Electronic Prior Authorization Form Status is pending       Roland Earl, CPhT Pharmacy Patient Advocate Specialist [facility] Patient Advocate Team Direct Number: [phone]  Fax: [phone]

## 2022-09-01 ENCOUNTER — Ambulatory Visit: Payer: Medicaid Other | Admitting: Physical Therapy

## 2022-09-01 ENCOUNTER — Telehealth: Payer: Self-pay

## 2022-09-01 NOTE — Transitions of Care (Post Inpatient/ED Visit) (Signed)
09/01/2022  Name: Kendra Griffith MRN: [id number] DOB: Jul 07, 1965  Today's TOC FU Call Status: Today's TOC FU Call Status:: Successful TOC FU Call Competed TOC FU Call Complete Date: 09/01/22  Transition Care Management Follow-up Telephone Call Date of Discharge: 08/31/22 Discharge Facility: [facility]) Type of Discharge: Inpatient Admission Primary Inpatient Discharge Diagnosis:: new onset CHF How have you been since you were released from the hospital?: Better (She said she is doing better, just concerned about the clot in her leg.) Any questions or concerns?: Yes Patient Questions/Concerns:: she had questions about the eliquis and torsemide doses.  I reviewed the orders for both medications multiple times and also spoke with Lillie Fragmin , RPH to clarify the order for eliquis for the patient.  She is to take as instructed on AVS. She is to start on Day 3 with the starter pack.  the only medication she is not taking is glutamine. She does not have a glucometer. Patient Questions/Concerns Addressed: Other: (reviewed med list with patient)  Items Reviewed: Did you receive and understand the discharge instructions provided?: Yes Medications obtained,verified, and reconciled?: Yes (Medications Reviewed) (Medication list reviewed in detail.  She was able to state correct orders for eliquis.  She said she knows to alternate doses of torsemide and today she took 1 tablet (20mg ) and tomorrow she will take 2 tablets (40 mg) , alternating every other day.) Any new allergies since your discharge?: No Dietary orders reviewed?: Yes Type of Diet Ordered:: heart healthy Do you have support at home?: Yes People in Home: child(ren), adult Name of Support/Comfort Primary Source: Her son is currently with her.  Medications Reviewed Today: Medications Reviewed Today     Reviewed by Robyne Peers, RN (Case Manager) on 09/01/22 at 1516  Med List Status: <None>   Medication Order Taking? Sig  Documenting Provider Last Dose Status Informant  acetaminophen-codeine (TYLENOL #3) 300-30 MG tablet [id number]  Take 1-2 tablets by mouth every 4 (four) hours as needed for moderate pain. Claiborne Rigg, NP  Active Self  albuterol (VENTOLIN HFA) 108 (90 Base) MCG/ACT inhaler [id number]  Inhale 2 puffs into the lungs every 6 (six) hours as needed for wheezing or shortness of breath. Claiborne Rigg, NP  Active Self  amitriptyline (ELAVIL) 75 MG tablet [id number]  Take 1 tablet (75 mg total) by mouth at bedtime for pain and depression Claiborne Rigg, NP  Active Self  APIXABAN Everlene Balls) VTE STARTER PACK (10MG  AND 5MG ) [id number]  Take as directed on package: **Start on DAY 3**:  take two-5mg  tablets twice daily for 5 days. then switch to one-5mg  tablet twice daily. Zannie Cove, MD  Active   Biotin 5 MG CAPS [id number]  Take 5 mg by mouth 3 (three) times daily. [provider]  Active Self  cholecalciferol (VITAMIN D3) 25 MCG (1000 UT) tablet [id number]  Take 1,000 Units by mouth daily. [provider]  Active Self  dapagliflozin propanediol (FARXIGA) 10 MG TABS tablet [id number]  Take 1 tablet (10 mg total) by mouth daily. Zannie Cove, MD  Active   digoxin (LANOXIN) 0.125 MG tablet [id number]  Take 1 tablet (0.125 mg total) by mouth daily. Zannie Cove, MD  Active   famotidine (PEPCID) 20 MG tablet [id number]  Take 1 tablet (20 mg total) by mouth 2 (two) times daily for heartburn Claiborne Rigg, NP  Active Self  gabapentin (NEURONTIN) 300 MG capsule [id number]  Take 1 capsule (300 mg total) by mouth 2 (two) times daily.  Claiborne Rigg, NP  Active Self  GLUTAMINE PO [id number] No Take 500 mg by mouth daily.  Patient not taking: Reported on 09/01/2022   [provider] Not Taking Active Self  hydrOXYzine (ATARAX) 25 MG tablet [id number]  Take 1 tablet (25 mg total) by mouth 3 (three) times daily as needed.  Patient taking differently: Take 25 mg by mouth 3 (three) times  daily as needed for anxiety.   Claiborne Rigg, NP  Active Self  Misc. Devices MISC [id number]  Please provide patient with incontinence pads or disposable underwear or choice. N39.3 Claiborne Rigg, NP  Active Self  omeprazole (PRILOSEC) 20 MG capsule [id number]  Take 1 capsule (20 mg total) by mouth daily for heartburn Claiborne Rigg, NP  Active Self  polyethylene glycol powder (GLYCOLAX/MIRALAX) 17 GM/SCOOP powder [id number]  Take 17 g by mouth daily for 10 days. Zannie Cove, MD  Active   potassium chloride SA (KLOR-CON M) 20 MEQ tablet [id number]  Take 1 tablet (20 mEq total) by mouth daily. Zannie Cove, MD  Active   rosuvastatin (CRESTOR) 5 MG tablet [id number]  Take 1 tablet (5 mg total) by mouth daily for cholesterol Claiborne Rigg, NP  Active Self  sacubitril-valsartan (ENTRESTO) 24-26 MG [id number]  Take 1 tablet by mouth 2 (two) times daily. Zannie Cove, MD  Active   spironolactone (ALDACTONE) 25 MG tablet [id number]  Take 1 tablet (25 mg total) by mouth daily. Zannie Cove, MD  Active   torsemide [facility]) 20 MG tablet [id number]  Take torsemide alternating between  1 tablet (20mg ) and 2 tablets (40 mg) every other day Zannie Cove, MD  Active   vitamin B-12 (CYANOCOBALAMIN) 500 MCG tablet [id number]  Take 500 mcg by mouth daily. [provider]  Active Self  vitamin C (ASCORBIC ACID) 500 MG tablet [id number]  Take 500 mg by mouth daily. [provider]  Active Self            Home Care and Equipment/Supplies: Were Home Health Services Ordered?: No Any new equipment or medical supplies ordered?: No  Functional Questionnaire: Do you need assistance with bathing/showering or dressing?: No Do you need assistance with meal preparation?: No Do you need assistance with eating?: No Do you have difficulty maintaining continence: No Do you need assistance with getting out of bed/getting out of a chair/moving?: Yes (She primarily uses a walker or cane but  has a wheelchair available if needed) Do you have difficulty managing or taking your medications?: No  Follow up appointments reviewed: PCP Follow-up appointment confirmed?: Yes Date of PCP follow-up appointment?: 09/08/22 Follow-up Provider: Corene Cornea, PA Specialist Hospital Follow-up appointment confirmed?: Yes Date of Specialist follow-up appointment?: 09/07/22 Follow-Up Specialty Provider:: [facility] Do you need transportation to your follow-up appointment?: No Do you understand care options if your condition(s) worsen?: Yes-patient verbalized understanding    SIGNATURE  Robyne Peers, RN

## 2022-09-02 ENCOUNTER — Telehealth (HOSPITAL_COMMUNITY): Payer: Self-pay | Admitting: Licensed Clinical Social Worker

## 2022-09-02 NOTE — Telephone Encounter (Signed)
H&V Care Navigation CSW Progress Note  Clinical Social Worker consulted to help pt with transportation concerns.  CSW called pt who reports she was able to set up transportation through her Medicaid benefit.  Reports no needs at this time or barriers to attending appt next week.  Patient is participating in a Managed Medicaid Plan:  Yes  SDOH Screenings   Food Insecurity: Food Insecurity Present (09/02/2022)  Housing: Low Risk  (09/02/2022)  Transportation Needs: Unmet Transportation Needs (09/02/2022)  Utilities: Not At Risk (08/31/2022)  Depression (PHQ2-9): High Risk (08/17/2022)  Financial Resource Strain: Medium Risk (09/02/2022)  Physical Activity: Insufficiently Active (09/02/2022)  Social Connections: Moderately Isolated (09/02/2022)  Stress: Stress Concern Present (09/02/2022)  Tobacco Use: High Risk (09/02/2022)   Burna Sis, LCSW Clinical Social Worker Advanced Heart Failure Clinic Desk#: [phone] Cell#: [phone]

## 2022-09-07 ENCOUNTER — Encounter (HOSPITAL_COMMUNITY): Payer: Self-pay

## 2022-09-07 ENCOUNTER — Other Ambulatory Visit: Payer: Self-pay

## 2022-09-07 ENCOUNTER — Ambulatory Visit (HOSPITAL_COMMUNITY)
Admit: 2022-09-07 | Discharge: 2022-09-07 | Disposition: A | Discharge status: Home / Self Care | Payer: Medicaid Other | Attending: Family Medicine | Admitting: Family Medicine

## 2022-09-07 VITALS — BP 90/64 | HR 91 | Wt 144.1 lb

## 2022-09-07 DIAGNOSIS — Z7901 Long term (current) use of anticoagulants: Secondary | ICD-10-CM | POA: Insufficient documentation

## 2022-09-07 DIAGNOSIS — F419 Anxiety disorder, unspecified: Secondary | ICD-10-CM | POA: Insufficient documentation

## 2022-09-07 DIAGNOSIS — I428 Other cardiomyopathies: Secondary | ICD-10-CM | POA: Insufficient documentation

## 2022-09-07 DIAGNOSIS — Z5982 Transportation insecurity: Secondary | ICD-10-CM | POA: Insufficient documentation

## 2022-09-07 DIAGNOSIS — I110 Hypertensive heart disease with heart failure: Secondary | ICD-10-CM | POA: Diagnosis not present

## 2022-09-07 DIAGNOSIS — G8929 Other chronic pain: Secondary | ICD-10-CM | POA: Insufficient documentation

## 2022-09-07 DIAGNOSIS — I5022 Chronic systolic (congestive) heart failure: Secondary | ICD-10-CM | POA: Insufficient documentation

## 2022-09-07 DIAGNOSIS — D509 Iron deficiency anemia, unspecified: Secondary | ICD-10-CM | POA: Diagnosis not present

## 2022-09-07 DIAGNOSIS — Z5986 Financial insecurity: Secondary | ICD-10-CM | POA: Insufficient documentation

## 2022-09-07 DIAGNOSIS — Z79899 Other long term (current) drug therapy: Secondary | ICD-10-CM | POA: Diagnosis not present

## 2022-09-07 DIAGNOSIS — F17210 Nicotine dependence, cigarettes, uncomplicated: Secondary | ICD-10-CM | POA: Diagnosis not present

## 2022-09-07 DIAGNOSIS — Z7984 Long term (current) use of oral hypoglycemic drugs: Secondary | ICD-10-CM | POA: Diagnosis not present

## 2022-09-07 DIAGNOSIS — F319 Bipolar disorder, unspecified: Secondary | ICD-10-CM | POA: Diagnosis not present

## 2022-09-07 DIAGNOSIS — K219 Gastro-esophageal reflux disease without esophagitis: Secondary | ICD-10-CM | POA: Diagnosis not present

## 2022-09-07 DIAGNOSIS — I509 Heart failure, unspecified: Secondary | ICD-10-CM

## 2022-09-07 DIAGNOSIS — I82402 Acute embolism and thrombosis of unspecified deep veins of left lower extremity: Secondary | ICD-10-CM | POA: Diagnosis not present

## 2022-09-07 DIAGNOSIS — I447 Left bundle-branch block, unspecified: Secondary | ICD-10-CM | POA: Insufficient documentation

## 2022-09-07 DIAGNOSIS — E119 Type 2 diabetes mellitus without complications: Secondary | ICD-10-CM | POA: Insufficient documentation

## 2022-09-07 DIAGNOSIS — D573 Sickle-cell trait: Secondary | ICD-10-CM | POA: Diagnosis not present

## 2022-09-07 DIAGNOSIS — Z8249 Family history of ischemic heart disease and other diseases of the circulatory system: Secondary | ICD-10-CM | POA: Insufficient documentation

## 2022-09-07 LAB — BRAIN NATRIURETIC PEPTIDE: B Natriuretic Peptide: 119 pg/mL — ABNORMAL HIGH (ref 0.0–100.0)

## 2022-09-07 LAB — BASIC METABOLIC PANEL
Anion gap: 15 (ref 5–15)
BUN: 24 mg/dL — ABNORMAL HIGH (ref 6–20)
CO2: 27 mmol/L (ref 22–32)
Calcium: 10.3 mg/dL (ref 8.9–10.3)
Chloride: 96 mmol/L — ABNORMAL LOW (ref 98–111)
Creatinine, Ser: 1.34 mg/dL — ABNORMAL HIGH (ref 0.44–1.00)
GFR, Estimated: 47 mL/min — ABNORMAL LOW (ref >60)
Glucose, Bld: 110 mg/dL — ABNORMAL HIGH (ref 70–99)
Potassium: 4.5 mmol/L (ref 3.5–5.1)
Sodium: 138 mmol/L (ref 135–145)

## 2022-09-07 LAB — CBC
HCT: 47.8 % — ABNORMAL HIGH (ref 36.0–46.0)
Hemoglobin: 14.8 g/dL (ref 12.0–15.0)
MCH: 26.9 pg (ref 26.0–34.0)
MCHC: 31 g/dL (ref 30.0–36.0)
MCV: 86.8 fL (ref 80.0–100.0)
Platelets: 228 10*3/uL (ref 150–400)
RBC: 5.51 MIL/uL — ABNORMAL HIGH (ref 3.87–5.11)
RDW: 15.9 % — ABNORMAL HIGH (ref 11.5–15.5)
WBC: 6.1 10*3/uL (ref 4.0–10.5)
nRBC: 0 % (ref 0.0–0.2)

## 2022-09-07 LAB — DIGOXIN LEVEL: Digoxin Level: 1.7 ng/mL (ref 0.8–2.0)

## 2022-09-07 MED ORDER — SACUBITRIL-VALSARTAN 24-26 MG PO TABS
1.0000 | ORAL_TABLET | Freq: Two times a day (BID) | ORAL | 3 refills | Status: DC
  Filled 2022-09-07: qty 60, 30d supply, fill #0

## 2022-09-07 MED ORDER — POTASSIUM CHLORIDE CRYS ER 20 MEQ PO TBCR
20.0000 meq | EXTENDED_RELEASE_TABLET | Freq: Every day | ORAL | 3 refills | Status: DC
  Filled 2022-09-07 – 2022-10-25 (×4): qty 30, 30d supply, fill #0
  Filled 2022-12-05 (×2): qty 30, 30d supply, fill #1
  Filled 2023-01-10 (×2): qty 30, 30d supply, fill #2

## 2022-09-07 MED ORDER — TORSEMIDE 20 MG PO TABS
20.0000 mg | ORAL_TABLET | Freq: Every day | ORAL | 3 refills | Status: DC
  Filled 2022-09-07: qty 60, 30d supply, fill #0
  Filled 2022-09-13: qty 28, 14d supply, fill #0

## 2022-09-07 MED ORDER — DAPAGLIFLOZIN PROPANEDIOL 10 MG PO TABS
10.0000 mg | ORAL_TABLET | Freq: Every day | ORAL | 6 refills | Status: DC
  Filled 2022-09-07 – 2022-10-25 (×4): qty 30, 30d supply, fill #0
  Filled 2022-12-05 (×2): qty 30, 30d supply, fill #1
  Filled 2023-01-10 (×2): qty 30, 30d supply, fill #2
  Filled 2023-02-14 (×2): qty 30, 30d supply, fill #3
  Filled 2023-03-06 – 2023-03-13 (×2): qty 30, 30d supply, fill #4
  Filled 2023-04-17 – 2023-04-24 (×2): qty 30, 30d supply, fill #5

## 2022-09-07 MED ORDER — APIXABAN 5 MG PO TABS
5.0000 mg | ORAL_TABLET | Freq: Two times a day (BID) | ORAL | 6 refills | Status: DC
  Filled 2022-09-07 – 2022-10-25 (×4): qty 60, 30d supply, fill #0
  Filled 2022-12-05 (×2): qty 60, 30d supply, fill #1
  Filled 2023-01-10 (×2): qty 60, 30d supply, fill #2
  Filled 2023-03-06: qty 60, 30d supply, fill #3
  Filled 2023-04-17 – 2023-04-24 (×2): qty 60, 30d supply, fill #4
  Filled 2023-05-26: qty 60, 30d supply, fill #5

## 2022-09-07 MED ORDER — SPIRONOLACTONE 25 MG PO TABS
25.0000 mg | ORAL_TABLET | Freq: Every day | ORAL | 3 refills | Status: DC
  Filled 2022-09-07 – 2022-10-25 (×4): qty 30, 30d supply, fill #0

## 2022-09-07 MED ORDER — DIGOXIN 125 MCG PO TABS
0.1250 mg | ORAL_TABLET | Freq: Every day | ORAL | 3 refills | Status: DC
  Filled 2022-09-07: qty 30, 30d supply, fill #0

## 2022-09-07 NOTE — Progress Notes (Signed)
Advanced Heart Failure Clinic Note   Referring Physician: PCP: Claiborne Rigg, NP PCP-Cardiologist: Dr. Shirlee Latch   Reason for Visit: Hospital F/u for Systolic Heart Failure   HPI:  57 y/o AAF heavy smoker with diabetes and a remote history of ETOH abuse, recently admitted w/ new acute systolic heart failure and LE DVT.   She has had progressive exertional dyspnea for several months, worse recently and rare atypical chest pain. Presented to [facility] 5/24 for evaluation and found to be in acute CHF. Echo 5/24 EF <20%, GIIIDD, RV mod reduced, mod elevated PASP ~58, LA mod dilated, RA mildly dilated, mild MR, mod TR. Hs trops negative. She was diuresed w/ IV Lasix and started on GDMT. Heparin started for LE DVT. V/Q scan showed no perfusion defects to suggest PE. She underwent R/LHC which showed normal coronary arteries, severe NICM EF < 20% w/ elevated filling pressures with low cardiac output (RA 6, PA 47/24/33, PA sat 57%, FICK CI 2.1). She was diuresed further w/ IV Lasix and transitioned to PO torsemide. GDMT further titrated. Switched to Eliquis after completion of cath. cMRI was recommended, however unable to get study completed inpatient as she had been administered IV Fe (Ferrlecit) for IDA earlier during admit. Recommended to complete outpatient. On 5/22, she was felt to be stable for d/c home. Volume status improved. Dyspnea resolved. CVP was 5 and co-ox 61% day of d/c. D/c wt 145 lb.   She presents today for post hospital f/u. Reports doing well. Improved symptoms. Able to do ADLs w/o significant dyspnea. Reports full med compliance. BP low normal, 90/64, but no orthostatic symptoms. Wt stable, down 1 lb since discharge. EKG shows NSR w/ incomplete LBBB, QRS duration 118 bpm. Fully compliant w/ Eliquis. No abnormal bleeding. Has new pt appt w/ new PCP tomorrow. Still smoking. Quitting has been hard, especially difficult after recent unexpected family death. She plans to talk to PCP tomorrow  regarding smoking cessation aids.       Cardiovascular Studies  2D Echo 5/24  1. Left ventricular ejection fraction, by estimation, is <20%. The left  ventricle has severely decreased function. The left ventricle demonstrates  global hypokinesis. The left ventricular internal cavity size was  moderately dilated. Left ventricular  diastolic parameters are consistent with Grade III diastolic dysfunction  (restrictive).   2. Right ventricular systolic function is moderately reduced. The right  ventricular size is normal. There is moderately elevated pulmonary artery  systolic pressure. The estimated right ventricular systolic pressure is  58.0 mmHg.   3. Left atrial size was moderately dilated.   4. Right atrial size was mildly dilated.   5. The mitral valve is normal in structure. Mild mitral valve  regurgitation. No evidence of mitral stenosis.   6. Tricuspid valve regurgitation is moderate.   7. The aortic valve is tricuspid. There is mild calcification of the  aortic valve. Aortic valve regurgitation is not visualized. No aortic  stenosis is present.   8. The inferior vena cava is dilated in size with <50% respiratory  variability, suggesting right atrial pressure of 15 mmHg.   [facility] 5/24 Findings: Ao = 109/82 (94) LV = 105/29 RA = 6 RV = 46/8 PA = 47/24 (33) PCW = 23 Fick cardiac output/index = 3.4/2.1 PVR = 2.6 WU SVR = 1,831 FA sat = 93% PA sat = 54%, 57%   Assessment: 1. Normal coronary arteries 2. Severe NICM EF < 20% 3. Elevated filling pressures with low cardiac output  LE venous dopplers + for LLE DVT   V/Q scan - for PE      Review of Systems: [y] = yes, [ ]  = no   General: Weight gain [ ] ; Weight loss [ ] ; Anorexia [ ] ; Fatigue [ ] ; Fever [ ] ; Chills [ ] ; Weakness [ ]   Cardiac: Chest pain/pressure [ ] ; Resting SOB [ ] ; Exertional SOB [ ] ; Orthopnea [ ] ; Pedal Edema [ ] ; Palpitations [ ] ; Syncope [ ] ; Presyncope [ ] ; Paroxysmal nocturnal  dyspnea[ ]   Pulmonary: Cough [ ] ; Wheezing[ ] ; Hemoptysis[ ] ; Sputum [ ] ; Snoring [ ]   GI: Vomiting[ ] ; Dysphagia[ ] ; Melena[ ] ; Hematochezia [ ] ; Heartburn[ ] ; Abdominal pain [ ] ; Constipation [ ] ; Diarrhea [ ] ; BRBPR [ ]   GU: Hematuria[ ] ; Dysuria [ ] ; Nocturia[ ]   Vascular: Pain in legs with walking [ ] ; Pain in feet with lying flat [ ] ; Non-healing sores [ ] ; Stroke [ ] ; TIA [ ] ; Slurred speech [ ] ;  Neuro: Headaches[ ] ; Vertigo[ ] ; Seizures[ ] ; Paresthesias[ ] ;Blurred vision [ ] ; Diplopia [ ] ; Vision changes [ ]   Ortho/Skin: Arthritis [ ] ; Joint pain [ ] ; Muscle pain [ ] ; Joint swelling [ ] ; Back Pain [ ] ; Rash [ ]   Psych: Depression[ ] ; Anxiety[ ]   Heme: Bleeding problems [ ] ; Clotting disorders [ ] ; Anemia [ ]   Endocrine: Diabetes [Y ]; Thyroid dysfunction[ ]    Past Medical History:  Diagnosis Date   ABSCESS, TOOTH 08/20/2009   Qualifier: Diagnosis of  By: Huntley Dec, Scott     Anemia    Anxiety    Bipolar disorder (HCC)    Blood transfusion without reported diagnosis    childbirth   Chronic pain    feet and back   Clotting disorder (HCC)    childbirth   Dental caries    lesion soft palate   Depression    Diabetes mellitus without complication (HCC)    prediabetes   GERD (gastroesophageal reflux disease)    Hypertension    Injury    right side from jumping off bunkbed   Pre-diabetes    Sickle cell trait (HCC)    Wears glasses     Current Outpatient Medications  Medication Sig Dispense Refill   acetaminophen-codeine (TYLENOL #3) 300-30 MG tablet Take 1-2 tablets by mouth every 4 (four) hours as needed for moderate pain. 60 tablet 0   albuterol (VENTOLIN HFA) 108 (90 Base) MCG/ACT inhaler Inhale 2 puffs into the lungs every 6 (six) hours as needed for wheezing or shortness of breath. 18 g 0   amitriptyline (ELAVIL) 75 MG tablet Take 1 tablet (75 mg total) by mouth at bedtime for pain and depression 90 tablet 1   apixaban (ELIQUIS) 5 MG TABS tablet Take 5 mg by mouth 2  (two) times daily.     Biotin 5 MG CAPS Take 5 mg by mouth 3 (three) times daily.     cholecalciferol (VITAMIN D3) 25 MCG (1000 UT) tablet Take 1,000 Units by mouth daily.     dapagliflozin propanediol (FARXIGA) 10 MG TABS tablet Take 1 tablet (10 mg total) by mouth daily. 30 tablet 0   digoxin (LANOXIN) 0.125 MG tablet Take 1 tablet (0.125 mg total) by mouth daily. 30 tablet 0   famotidine (PEPCID) 20 MG tablet Take 1 tablet (20 mg total) by mouth 2 (two) times daily for heartburn 180 tablet 1   gabapentin (NEURONTIN) 300 MG capsule Take 1 capsule (300 mg total) by mouth  2 (two) times daily. 180 capsule 3   GLUTAMINE PO Take 500 mg by mouth daily.     hydrOXYzine (ATARAX) 25 MG tablet Take 1 tablet (25 mg total) by mouth 3 (three) times daily as needed. (Patient taking differently: Take 25 mg by mouth 3 (three) times daily as needed for anxiety.) 90 tablet 1   Misc. Devices MISC Please provide patient with incontinence pads or disposable underwear or choice. N39.3 1 each PRN   omeprazole (PRILOSEC) 20 MG capsule Take 1 capsule (20 mg total) by mouth daily for heartburn 90 capsule 1   polyethylene glycol powder (GLYCOLAX/MIRALAX) 17 GM/SCOOP powder Take 17 g by mouth daily for 10 days. 238 g 0   potassium chloride SA (KLOR-CON M) 20 MEQ tablet Take 1 tablet (20 mEq total) by mouth daily. 30 tablet 0   rosuvastatin (CRESTOR) 5 MG tablet Take 1 tablet (5 mg total) by mouth daily for cholesterol 90 tablet 1   sacubitril-valsartan (ENTRESTO) 24-26 MG Take 1 tablet by mouth 2 (two) times daily. 60 tablet 0   spironolactone (ALDACTONE) 25 MG tablet Take 1 tablet (25 mg total) by mouth daily. 30 tablet 0   torsemide (DEMADEX) 20 MG tablet Take torsemide alternating between  1 tablet (20mg ) and 2 tablets (40 mg) every other day 60 tablet 0   vitamin B-12 (CYANOCOBALAMIN) 500 MCG tablet Take 500 mcg by mouth daily.     vitamin C (ASCORBIC ACID) 500 MG tablet Take 500 mg by mouth daily.     No current  facility-administered medications for this encounter.    Allergies  Allergen Reactions   Amoxicillin     Pain Did it involve swelling of the face/tongue/throat, SOB, or low BP? No Did it involve sudden or severe rash/hives, skin peeling, or any reaction on the inside of your mouth or nose? No Did you need to seek medical attention at a hospital or doctor's office? Yes When did it last happen?      5 years ago If all above answers are "NO", may proceed with cephalosporin use.    Darvocet [Propoxyphene N-Acetaminophen] Nausea And Vomiting    Makes her jittery   Hydrocodone-Acetaminophen Nausea And Vomiting    Upset stomach   Percocet [Oxycodone-Acetaminophen]     Makes her jittery   Valium [Diazepam] Other (See Comments)    Makes pt. Feel "out of wack"      Social History   Socioeconomic History   Marital status: Single    Spouse name: Not on file   Number of children: 3   Years of education: 8-9   Highest education level: 8th grade  Occupational History   Occupation: applying for disability  Tobacco Use   Smoking status: Every Day    Packs/day: 2.00    Years: 29.00    Additional pack years: 0.00    Total pack years: 58.00    Types: Cigarettes   Smokeless tobacco: Never  Vaping Use   Vaping Use: Never used  Substance and Sexual Activity   Alcohol use: No    Alcohol/week: 0.0 standard drinks of alcohol    Comment: Stopped drinking 2013   Drug use: No   Sexual activity: Not Currently    Birth control/protection: None  Other Topics Concern   Not on file  Social History Narrative   Patient lives alone in an apartment on the first floor.  3 children.  Applying for disability.  Education: 8th or 9th grade.   Currently not working  Trying to go back to school online for GED   Six Grandchildren   Social Determinants of Health   Financial Resource Strain: Medium Risk (09/02/2022)   Overall Financial Resource Strain (CARDIA)    Difficulty of Paying Living Expenses:  Somewhat hard  Food Insecurity: Food Insecurity Present (09/02/2022)   Hunger Vital Sign    Worried About Running Out of Food in the Last Year: Sometimes true    Ran Out of Food in the Last Year: Sometimes true  Transportation Needs: Unmet Transportation Needs (09/02/2022)   PRAPARE - Administrator, Civil Service (Medical): Yes    Lack of Transportation (Non-Medical): Yes  Physical Activity: Insufficiently Active (09/02/2022)   Exercise Vital Sign    Days of Exercise per Week: 1 day    Minutes of Exercise per Session: 30 min  Stress: Stress Concern Present (09/02/2022)   [employer] of Occupational Health - Occupational Stress Questionnaire    Feeling of Stress : To some extent  Social Connections: Moderately Isolated (09/02/2022)   Social Connection and Isolation Panel [NHANES]    Frequency of Communication with Friends and Family: Three times a week    Frequency of Social Gatherings with Friends and Family: Once a week    Attends Religious Services: Never    Database administrator or Organizations: Yes    Attends Banker Meetings: 1 to 4 times per year    Marital Status: Divorced  Intimate Partner Violence: Unknown (06/08/2017)   Humiliation, Afraid, Rape, and Kick questionnaire    Fear of Current or Ex-Partner: Patient declined    Emotionally Abused: Patient declined    Physically Abused: Patient declined    Sexually Abused: Patient declined      Family History  Problem Relation Age of Onset   Heart disease Mother    Asthma Sister    Diabetes Sister    COPD Sister    Diabetes Sister    Diabetes Sister    Breast cancer Neg Hx    Colon cancer Neg Hx    Esophageal cancer Neg Hx    Pancreatic cancer Neg Hx    Liver disease Neg Hx    Stomach cancer Neg Hx     Vitals:   09/07/22 1200  BP: 90/64  Pulse: 91  SpO2: 98%  Weight: 65.4 kg (144 lb 2 oz)     PHYSICAL EXAM: General:  Well appearing. No respiratory difficulty HEENT:  normal Neck: supple. no JVD. Carotids 2+ bilat; no bruits. No lymphadenopathy or thyromegaly appreciated. Cor: PMI nondisplaced. Regular rate & rhythm. No rubs, gallops or murmurs. Lungs: clear Abdomen: soft, nontender, nondistended. No hepatosplenomegaly. No bruits or masses. Good bowel sounds. Extremities: no cyanosis, clubbing, rash, edema Neuro: alert & oriented x 3, cranial nerves grossly intact. moves all 4 extremities w/o difficulty. Affect pleasant.  ECG: NSR w/ incomplete LBBB, QRS duration 118 bpm   ASSESSMENT & PLAN:  1. Chronic Systolic CHF:  New diagnosis. Echo 5/24 EF <20%, GIIIDD, RV mod reduced, mod elevated PASP ~58, LA mod dilated, RA mildly dilated, mild MR, mod TR. NICM. R/LHC c/w severe NICM, demonstrating normal cors, elevated filling pressures and low output, CI 2.1, PCWP 23. She was diuresed down to dry wt of 145 lb and placed on GDMT. Etiology for CM uncertain. Previous hx heavy alcohol use but quit a number of years ago. HIV negative.  No recent viral-type illness. She does have a FH of CHF (sister and mother). - plan cMRI  for further assessment of HF - NYHA Class II - Volume good. Euvolemic on exam. Wt stable since d/c, down 1 lb.  - Continue Spironolactone 25 mg daily  - Continue Farxiga 10 daily - Continue Entresto 24/26. BP too soft for titration  - Continue digoxin 0.125. Check dig level  - Check BMP and BNP today  - On Torsemide alternating between 20 and 40 mg qod. May need dose reduction pending BMP/BNP results  - continue GDMT titration q2-3 wks until fully optimized. Repeat echo in 3 months. Will need referral to EP for ICD if EF remains < 35%   2.  HTN: controlled on current regimen - GDMT per above - check BMP today    3. DMII: Hgb A1c 6.5 2/24 - recently started on Farxiga - further management per PCP   4. Tobacco Abuse: Currently heavy smoker, 2 PPD. Has smoked since she was 11. - smoking cessation advised  - has f/u w/ PCP tomorrow, will  discuss cessation aides   5. Acute DVT: Left leg DVT found 5/24.  Had DVT back in 1980s after she delivered her son.   - V/Q scan negative for significant perfusion defects  - Hypercoagulable w/u in process, checking factor V Leiden, prothrombin gene mutation, and antiphospholipid antibody>>PCP will f/u on results  - On treatment dose Eliquis  7. Fe deficiency anemia: given IV Fe during recent hospitalization  -  will need this followed by PCP, has f/u tomorrow, consider FOBT  - on Eliquis for DVT. Denies abnormal bleeding. Check CBC today   F/u w/ pharmD in 2-3 wks for further med titration, APP in 5-6 wks, 3 months w/ Dr. Shirlee Latch + echo    Robbie Lis, PA-C 09/07/22

## 2022-09-07 NOTE — Patient Instructions (Signed)
Medication Changes:  No changes, Continue current medications, refills have been send to [facility]  Lab Work:  Labs done today, your results will be available in [place], we will contact you for abnormal readings.  Testing/Procedures:  Your physician has requested that you have an echocardiogram. Echocardiography is a painless test that uses sound waves to create images of your heart. It provides your doctor with information about the size and shape of your heart and how well your heart's chambers and valves are working. This procedure takes approximately one hour. There are no restrictions for this procedure. Please do NOT wear cologne, perfume, aftershave, or lotions (deodorant is allowed). Please arrive 15 minutes prior to your appointment time. IN 3 MONTHS WITH FOLLOW-UP   Your physician has requested that you have a cardiac MRI. Cardiac MRI uses a computer to create images of your heart as its beating, producing both still and moving pictures of your heart and major blood vessels. For further information please visit [email or web address]. Please follow the instruction sheet given to you today for more information. YOU WILL BE CALLED TO SCHEDULE THIS APPOINTMENT  Referrals:  NONE  Special Instructions // Education:  Do the following things EVERYDAY: Weigh yourself in the morning before breakfast. Write it down and keep it in a log. Take your medicines as prescribed Eat low salt foods--Limit salt (sodium) to 2000 mg per day.  Stay as active as you can everyday Limit all fluids for the day to less than 2 liters   Follow-Up in:   Please follow up with our heart failure pharmacist in 3 weeks  Your physician recommends that you schedule a follow-up appointment in: 6 weeks and again in 3 months with an echocardiogram    At the [facility] Clinic, you and your health needs are our priority. We have a designated team specialized in the  treatment of Heart Failure. This Care Team includes your primary Heart Failure Specialized Cardiologist (physician), Advanced Practice Providers (APPs- Physician Assistants and Nurse Practitioners), and Pharmacist who all work together to provide you with the care you need, when you need it.   You may see any of the following providers on your designated Care Team at your next follow up:  Dr. Arvilla Meres Dr. Marca Ancona Dr. Marcos Eke, NP Robbie Lis, [place] [facility] [place], [place] Brynda Peon, NP Karle Plumber, PharmD   Please be sure to bring in all your medications bottles to every appointment.   Need to Contact us:  If you have any questions or concerns before your next appointment please send [place] a message through [place] or call our office at [phone].    TO LEAVE A MESSAGE FOR THE NURSE SELECT OPTION 2, PLEASE LEAVE A MESSAGE INCLUDING: YOUR NAME DATE OF BIRTH CALL BACK NUMBER REASON FOR CALL**this is important as we prioritize the call backs  YOU WILL RECEIVE A CALL BACK THE SAME DAY AS LONG AS YOU CALL BEFORE 4:00 PM

## 2022-09-08 ENCOUNTER — Ambulatory Visit: Payer: Medicaid Other | Admitting: Physician Assistant

## 2022-09-08 ENCOUNTER — Encounter: Payer: Medicaid Other | Admitting: Physical Therapy

## 2022-09-08 NOTE — Progress Notes (Deleted)
Patient ID: Kendra Griffith, female   DOB: 04/09/1966, 57 y.o.   MRN: [id number]    Discharge Diagnoses:  Principal Problem: Acute systolic CHF, new diagnosis, NICM Type 2 diabetes mellitus   Depression with anxiety   Essential hypertension, benign   Chronic pain syndrome   Elevated transaminase level   Acute LLE DVT (deep venous thrombosis) [facility])  Hospital Course:    Acute systolic CHF, BiV failure -Admitted with volume overload -2D echo: EF < 20%, global hypokinesis, grade 3 DD, right ventricular systolic function moderately reduced, moderately elevated pulmonary artery systolic pressure, moderate TR. - R/L cardiac cath showed no CAD, elevated filling pressures, normal output. -VQ scan negative for PE, -Diuresed with IV Lasix, volume status has improved, followed by heart failure team this admission -Continue Entresto Farxiga, Aldactone, digoxin and torsemide 20 Mg alternating with 40 Mg daily -Plan for cardiac MRI as outpatient as she received IV iron this admission -Discharged home in stable condition, follow-up with heart failure clinic in 1 to 2 weeks, BMP in 1 week   Acute Left LE DVT in popliteal and peroneal vein: - Venous Doppler + ac DVT -VQ scan negative for PE, transitioned to apixaban     Elevated transaminase level -Likely due to #1, congestive hepatopathy.  Acute hepatitis panel negative -RUQ ultrasound showed gallbladder sludge without sonographic evidence of acute cholecystitis -LFTs improving   Iron deficiency anemia -Given IV iron, Hemoccults not checked, she did not have a BM -Discharged home on laxatives, resume oral iron therapy with laxative after BM, needs GI eval for iron deficiency     Essential hypertension, benign -BP stable     Chronic pain syndrome -Continue amitriptyline, gabapentin   Diet-controlled diabetes mellitus -Hemoglobin A1c 6.1 on 08/27/2022 -Now on Farxiga   Depression with anxiety -Continue amitriptyline, hydroxyzine as  needed  From cardiology/heart and vascular yesterday HPI:   57 y/o AAF heavy smoker with diabetes and a remote history of ETOH abuse, recently admitted w/ new acute systolic heart failure and LE DVT.    She has had progressive exertional dyspnea for several months, worse recently and rare atypical chest pain. Presented to [facility] 5/24 for evaluation and found to be in acute CHF. Echo 5/24 EF <20%, GIIIDD, RV mod reduced, mod elevated PASP ~58, LA mod dilated, RA mildly dilated, mild MR, mod TR. Hs trops negative. She was diuresed w/ IV Lasix and started on GDMT. Heparin started for LE DVT. V/Q scan showed no perfusion defects to suggest PE. She underwent R/LHC which showed normal coronary arteries, severe NICM EF < 20% w/ elevated filling pressures with low cardiac output (RA 6, PA 47/24/33, PA sat 57%, FICK CI 2.1). She was diuresed further w/ IV Lasix and transitioned to PO torsemide. GDMT further titrated. Switched to Eliquis after completion of cath. cMRI was recommended, however unable to get study completed inpatient as she had been administered IV Fe (Ferrlecit) for IDA earlier during admit. Recommended to complete outpatient. On 5/22, she was felt to be stable for d/c home. Volume status improved. Dyspnea resolved. CVP was 5 and co-ox 61% day of d/c. D/c wt 145 lb.    She presents today for post hospital f/u. Reports doing well. Improved symptoms. Able to do ADLs w/o significant dyspnea. Reports full med compliance. BP low normal, 90/64, but no orthostatic symptoms. Wt stable, down 1 lb since discharge. EKG shows NSR w/ incomplete LBBB, QRS duration 118 bpm. Fully compliant w/ Eliquis. No abnormal bleeding. Has new pt appt  w/ new PCP tomorrow. Still smoking. Quitting has been hard, especially difficult after recent unexpected family death. She plans to talk to PCP tomorrow regarding smoking cessation aids.  ASSESSMENT & PLAN:   1. Chronic Systolic CHF:  New diagnosis. Echo 5/24 EF <20%, GIIIDD, RV  mod reduced, mod elevated PASP ~58, LA mod dilated, RA mildly dilated, mild MR, mod TR. NICM. R/LHC c/w severe NICM, demonstrating normal cors, elevated filling pressures and low output, CI 2.1, PCWP 23. She was diuresed down to dry wt of 145 lb and placed on GDMT. Etiology for CM uncertain. Previous hx heavy alcohol use but quit a number of years ago. HIV negative.  No recent viral-type illness. She does have a FH of CHF (sister and mother). - plan cMRI for further assessment of HF - NYHA Class II - Volume good. Euvolemic on exam. Wt stable since d/c, down 1 lb.  - Continue Spironolactone 25 mg daily  - Continue Farxiga 10 daily - Continue Entresto 24/26. BP too soft for titration  - Continue digoxin 0.125. Check dig level  - Check BMP and BNP today  - On Torsemide alternating between 20 and 40 mg qod. May need dose reduction pending BMP/BNP results  - continue GDMT titration q2-3 wks until fully optimized. Repeat echo in 3 months. Will need referral to EP for ICD if EF remains < 35%    2.  HTN: controlled on current regimen - GDMT per above - check BMP today      3. DMII: Hgb A1c 6.5 2/24 - recently started on Farxiga - further management per PCP    4. Tobacco Abuse: Currently heavy smoker, 2 PPD. Has smoked since she was 11. - smoking cessation advised  - has f/u w/ PCP tomorrow, will discuss cessation aides    5. Acute DVT: Left leg DVT found 5/24.  Had DVT back in 1980s after she delivered her son.   - V/Q scan negative for significant perfusion defects  - Hypercoagulable w/u in process, checking factor V Leiden, prothrombin gene mutation, and antiphospholipid antibody>>PCP will f/u on results  - On treatment dose Eliquis   7. Fe deficiency anemia: given IV Fe during recent hospitalization  -  will need this followed by PCP, has f/u tomorrow, consider FOBT  - on Eliquis for DVT. Denies abnormal bleeding. Check CBC today    F/u w/ pharmD in 2-3 wks for further med titration, APP  in 5-6 wks, 3 months w/ Dr. Shirlee Latch + echo

## 2022-09-09 ENCOUNTER — Telehealth (HOSPITAL_COMMUNITY): Payer: Self-pay | Admitting: *Deleted

## 2022-09-09 LAB — PROTHROMBIN GENE MUTATION

## 2022-09-09 NOTE — Telephone Encounter (Signed)
-----   Message from Allayne Butcher, [place] sent at 09/07/2022  4:20 PM EDT ----- Digoxin level is elevated. Stop digoxin.   All other labs ok.

## 2022-09-09 NOTE — Telephone Encounter (Signed)
  Geroge Baseman [place], RN 09/09/2022  5:30 PM EDT Back to Top    Spoke w/pt, she is aware, agreeable, and verbalized understanding, med list updated   Philicia Glade Lloyd, CMA 09/09/2022  2:58 PM EDT     No answer, Left message to return call.   Philicia R Branch, CMA 09/08/2022 10:58 AM EDT     No answer, Left message to return call.   Emer Arminda Resides, RN 09/07/2022  4:40 PM EDT     Left message to call office back, my chart message also sent

## 2022-09-12 ENCOUNTER — Encounter (HOSPITAL_COMMUNITY): Payer: Medicaid Other

## 2022-09-13 ENCOUNTER — Other Ambulatory Visit (HOSPITAL_COMMUNITY): Payer: Self-pay

## 2022-09-13 ENCOUNTER — Other Ambulatory Visit: Payer: Self-pay

## 2022-09-13 ENCOUNTER — Other Ambulatory Visit: Payer: Self-pay | Admitting: Family Medicine

## 2022-09-13 DIAGNOSIS — E785 Hyperlipidemia, unspecified: Secondary | ICD-10-CM

## 2022-09-13 NOTE — Telephone Encounter (Signed)
Call to patient- patient states she does not need this at [facility]- ok to deny Requested Prescriptions  Pending Prescriptions Disp Refills   rosuvastatin (CRESTOR) 5 MG tablet [Pharmacy Med Name: ROSUVASTATIN 5MG  TABLETS] 90 tablet 1    Sig: TAKE 1 TABLET(5 MG) BY MOUTH DAILY     Cardiovascular:  Antilipid - Statins 2 Failed - 09/13/2022  3:35 AM      Failed - Cr in normal range and within 360 days    Creat  Date Value Ref Range Status  01/21/2016 0.83 0.50 - 1.05 mg/dL Final    Comment:      For patients > or = 57 years of age: The upper reference limit for Creatinine is approximately 13% higher for people identified as African-American.      Creatinine, Ser  Date Value Ref Range Status  09/07/2022 1.34 (H) 0.44 - 1.00 mg/dL Final   Creatinine,U  Date Value Ref Range Status  07/30/2009 73.1 mg/dL Final    Comment:    See lab report for associated comment(s)   Creatinine, Urine  Date Value Ref Range Status  01/21/2016 166 20 - 320 mg/dL Final         Failed - Lipid Panel in normal range within the last 12 months    Cholesterol, Total  Date Value Ref Range Status  01/05/2022 159 100 - 199 mg/dL Final   Cholesterol  Date Value Ref Range Status  08/27/2022 77 0 - 200 mg/dL Final   LDL Chol Calc (NIH)  Date Value Ref Range Status  01/05/2022 83 0 - 99 mg/dL Final   LDL Cholesterol  Date Value Ref Range Status  08/27/2022 42 0 - 99 mg/dL Final    Comment:           Total Cholesterol/HDL:CHD Risk Coronary Heart Disease Risk Table                     Men   Women  1/2 Average Risk   3.4   3.3  Average Risk       5.0   4.4  2 X Average Risk   9.6   7.1  3 X Average Risk  23.4   11.0        Use the calculated Patient Ratio above and the CHD Risk Table to determine the patient's CHD Risk.        ATP III CLASSIFICATION (LDL):  <100     mg/dL   Optimal  161-096  mg/dL   Near or Above                    Optimal  130-159  mg/dL   Borderline  045-409  mg/dL    High  >811     mg/dL   Very High Performed at [facility], [address] [place], [place] [address]    HDL  Date Value Ref Range Status  08/27/2022 20 (L) >40 mg/dL Final  29/56/2130 65 >86 mg/dL Final   Triglycerides  Date Value Ref Range Status  08/27/2022 75 <150 mg/dL Final         Passed - Patient is not pregnant      Passed - Valid encounter within last 12 months    Recent Outpatient Visits           3 weeks ago Primary hypertension   [facility] [place], [place], NP   2 months ago Hordeolum  externum of left upper eyelid   [facility] [place], [place], [place]   3 months ago Primary hypertension   [facility] [place], [place], NP   6 months ago Primary hypertension   [facility] [place], Cornelius Moras, RPH-CPP   8 months ago Primary hypertension   [place] [facility] [place], Shea Stakes, NP       Future Appointments             In 4 weeks Claiborne Rigg, NP [facility]   In 1 month [place], Aram Candela, PsyD [facility], CPR   In 2 months Claiborne Rigg, NP [facility]

## 2022-09-14 ENCOUNTER — Other Ambulatory Visit: Payer: Self-pay

## 2022-09-14 DIAGNOSIS — K921 Melena: Secondary | ICD-10-CM | POA: Diagnosis not present

## 2022-09-15 ENCOUNTER — Ambulatory Visit: Payer: Self-pay

## 2022-09-15 LAB — FECAL OCCULT BLOOD, IMMUNOCHEMICAL: Fecal Occult Bld: NEGATIVE

## 2022-09-15 NOTE — Telephone Encounter (Signed)
  Chief Complaint: smoking cessation Symptoms: NA, wanting to quit smoking  Frequency: 35 years, 1-1.5 packs/days Pertinent Negatives: NA Disposition: [] ED /[] Urgent Care (no appt availability in office) / [] Appointment(In office/virtual)/ []  [facility]/ [] Home Care/ [] Refused Recommended Disposition /[] [facility]/ [x]  Follow-up with PCP Additional Notes: pt is wanting to start medication for smoking cessation. Says that she got patches and lozenges that were delivered same day she went to hospital but was afraid to start since dx with HF and wanting to review medication with provider. Tried to schedule hosp FU appt but no appts until 10/12/22, advised pt I would send message back to provider to see if they can get her scheduled for OV of Hosp FU since [place] was 08/25/22 and nurse can call her back. Pt verbalized understanding.   Summary: Medication Questions   Patient would like to know what medications to help her quit smoking are safe with her current medications. Please advise.       Reason for Disposition  Requesting prescription medication to help quit smoking  Answer Assessment - Initial Assessment Questions 1. MAIN CONCERN or SYMPTOMS: "What question or concern do you have?" "What symptoms are you having?" (e.g., none, cough, difficulty breathing, headache, dizziness, nausea)      NA 2. ONSET:  "When did the symptoms begin?"    Still smoking  3. PRODUCT USED: "What do you smoke?" (e.g., cigarette, cigar, e-cigarette, pipe)      cigarettes 4. OTHER PRODUCTS: "Do you use other tobacco or nicotine products?" (e.g., smokeless tobacco, chew or snuff, hookah, hand rolled tobacco in a leaf)      no 4. HOW OFTEN: "How many times per day or week do you typically smoke?" (e.g., 1 pack / week, half pack / day, 2 packs / day)     1-1.5 packs/days  5.  HOW LONG: "How long have you smoked?" (e.g., months, years)     35 years  6. TREATMENT:  "What treatment have you tried?  (e.g., none, gum, patch, medicine, treatment program)     Patches and lozenges previously and worked but went back to smoking  Protocols used: Smoking - Tobacco Use and Problems-A-AH

## 2022-09-16 NOTE — Telephone Encounter (Signed)
She can start the lozenges and patches. I will see her in July.

## 2022-09-16 NOTE — Progress Notes (Signed)
Advanced Heart Failure Clinic Note   Referring Physician: PCP: Claiborne Rigg, NP PCP-Cardiologist: Dr. Shirlee Latch  HPI:  57 y/o AAF heavy smoker with diabetes and a remote history of ETOH abuse, recently admitted w/ new acute systolic heart failure and LE DVT.    She has had progressive exertional dyspnea for several months, worse recently and rare atypical chest pain. Presented to [facility] 08/2022 for evaluation and found to be in acute CHF. Echo 08/2022 EF <20%, GIIIDD, RV mod reduced, mod elevated PASP ~58, LA mod dilated, RA mildly dilated, mild MR, mod TR. Hs trops negative. She was diuresed w/ IV Lasix and started on GDMT. Heparin started for LE DVT. V/Q scan showed no perfusion defects to suggest PE. She underwent R/LHC which showed normal coronary arteries, severe NICM EF < 20% w/ elevated filling pressures with low cardiac output (RA 6, PA 47/24/33, PA sat 57%, FICK CI 2.1). She was diuresed further w/ IV Lasix and transitioned to PO torsemide. GDMT further titrated. Switched to Eliquis after completion of cath. cMRI was recommended, however unable to get study completed inpatient as she had been administered IV Fe (Ferrlecit) for IDA earlier during admit. Recommended to complete outpatient. On 08/31/22, she was felt to be stable for d/c home. Volume status improved. Dyspnea resolved. CVP was 5 and co-ox 61% day of d/c. D/c wt 145 lb.    She presented to [facility] on 09/07/22 for post hospital f/u. Reported doing well. Improved symptoms. Able to do ADLs w/o significant dyspnea. Reported full med compliance. BP was low normal, 90/64, but no orthostatic symptoms. Weight was stable, down 1 lb since discharge. EKG showed NSR w/ incomplete LBBB, QRS duration 118 bpm. Fully compliant w/ Eliquis. No abnormal bleeding. Had new pt appt w/ new PCP the following day. Still smoking. Noted Quitting had been hard, especially difficult after recent unexpected family death. She planed to talk to PCP at next visit regarding  smoking cessation aids.    Today she returns to HF clinic for pharmacist medication titration. At last visit with APP, no medication changes were made due to soft BPs. Digoxin was discontinued after clinic visit due to elevated level. Overall she is doing well today. Does note a few episodes of dizziness, but attributes this to when she doesn't eat enough protein. Says she sometimes gets sick to her stomach in the mornings, which started before she had her HF diagnosis. Her daughter has been trying to help her eat more consistently. No CP or palpitations. No SOB/DOE. She has been doing 30 minutes of cardio at home 3 days a week and has also started going to [employer]. Does not have functioning scale at home, but weight in clinic has been stable at 144 lbs. Takes torsemide 40 mg alternating with 20 mg daily and has not needed any extra. No LEE, PND or orthopnea.  Taking and tolerating all medications, except she ran out of spironolactone 2 days ago. Spironolactone and other medications due to [facility] and she should receive tomorrow. BP in clinic 92/62, she does not check her BP at home.    HF Medications: Entresto 24/26 mg BID Spironolactone 25 mg daily Farxiga 10 mg daily Torsemide 40 mg alternating with 20 mg every other day KCL 20 mEq daily  Has the patient been experiencing any side effects to the medications prescribed?  no  Does the patient have any problems obtaining medications due to transportation or finances?  [employer] Medicaid. Medications from [facility].   Understanding of regimen: good Understanding of indications: good Potential of compliance: good Patient understands to avoid NSAIDs. Patient understands to avoid decongestants.    Pertinent Lab Values: 09/07/22: Serum creatinine 1.34, BUN 24, Potassium 4.5, Sodium 138  Vital Signs: Weight: 144.4 lbs (last clinic weight: 144.13 lbs) Blood  pressure: 92/62  Heart rate: 88   Assessment/Plan: 1. Chronic Systolic CHF:  New diagnosis. Echo 08/2022 EF <20%, GIIIDD, RV mod reduced, mod elevated PASP ~58, LA mod dilated, RA mildly dilated, mild MR, mod TR. NICM. R/LHC c/w severe NICM, demonstrating normal cors, elevated filling pressures and low output, CI 2.1, PCWP 23. She was diuresed down to dry wt of 145 lb and placed on GDMT. Etiology for CM uncertain. Previous hx heavy alcohol use but quit a number of years ago. HIV negative.  No recent viral-type illness. She does have a FH of CHF (sister and mother). - plan cMRI for further assessment of HF. Scheduled 11/2022 - NYHA Class II, Volume good. Euvolemic on exam.  -Decrease torsemide to 20 mg daily - Continue Entresto 24/26 mg BID. BP too soft for titration  - Continue Spironolactone 25 mg daily  - Continue Farxiga 10 mg daily - Digoxin recently discontinued due to elevated level.  - Hopefully can add beta blocker next visit if remains stable - continue GDMT titration q2-3 wks until fully optimized. Repeat echo 12/2022. Will need referral to EP for ICD if EF remains < 35%    2.  HTN: controlled on current regimen - GDMT per above   3. DMII: Hgb A1c 6.5 05/2022 - recently started on Farxiga - further management per PCP    4. Tobacco Abuse: Currently heavy smoker, 2 PPD. Has smoked since she was 11. - smoking cessation advised    5. Acute DVT: Left leg DVT found 08/2022.  Had DVT back in 1980s after she delivered her son.   - V/Q scan negative for significant perfusion defects  - Hypercoagulable w/u in process, checking factor V Leiden, prothrombin gene mutation, and antiphospholipid antibody>>PCP will f/u on results  - Continue Eliquis   7. Fe deficiency anemia: given IV Fe during recent hospitalization  -  will need this followed by PCP    Follow up 3 weeks with [facility]   Karle Plumber, PharmD, BCPS, BCCP, CPP Heart Failure Clinic Pharmacist [phone]

## 2022-09-19 LAB — FACTOR 5 LEIDEN

## 2022-09-19 NOTE — Telephone Encounter (Signed)
Call placed to patient unable to reach message left on VM. To advise that it is ok to take patches and lozenges. Please keep July appointment to discuss further with PCP

## 2022-09-26 ENCOUNTER — Other Ambulatory Visit: Payer: Self-pay

## 2022-09-26 ENCOUNTER — Other Ambulatory Visit (HOSPITAL_COMMUNITY): Payer: Self-pay

## 2022-09-27 ENCOUNTER — Other Ambulatory Visit: Payer: Self-pay

## 2022-09-27 ENCOUNTER — Ambulatory Visit (HOSPITAL_COMMUNITY)
Admission: RE | Admit: 2022-09-27 | Discharge: 2022-09-27 | Disposition: A | Discharge status: Home / Self Care | Payer: Medicaid Other | Source: Ambulatory Visit | Attending: Internal Medicine | Admitting: Internal Medicine

## 2022-09-27 ENCOUNTER — Other Ambulatory Visit (HOSPITAL_COMMUNITY): Payer: Self-pay

## 2022-09-27 VITALS — BP 92/62 | HR 88 | Wt 144.4 lb

## 2022-09-27 DIAGNOSIS — I110 Hypertensive heart disease with heart failure: Secondary | ICD-10-CM | POA: Diagnosis not present

## 2022-09-27 DIAGNOSIS — I5021 Acute systolic (congestive) heart failure: Secondary | ICD-10-CM | POA: Diagnosis present

## 2022-09-27 DIAGNOSIS — D539 Nutritional anemia, unspecified: Secondary | ICD-10-CM | POA: Insufficient documentation

## 2022-09-27 DIAGNOSIS — Z7901 Long term (current) use of anticoagulants: Secondary | ICD-10-CM | POA: Diagnosis not present

## 2022-09-27 DIAGNOSIS — Z86718 Personal history of other venous thrombosis and embolism: Secondary | ICD-10-CM | POA: Insufficient documentation

## 2022-09-27 DIAGNOSIS — F17210 Nicotine dependence, cigarettes, uncomplicated: Secondary | ICD-10-CM | POA: Insufficient documentation

## 2022-09-27 DIAGNOSIS — E119 Type 2 diabetes mellitus without complications: Secondary | ICD-10-CM | POA: Insufficient documentation

## 2022-09-27 DIAGNOSIS — I5022 Chronic systolic (congestive) heart failure: Secondary | ICD-10-CM

## 2022-09-27 MED ORDER — TORSEMIDE 20 MG PO TABS
20.0000 mg | ORAL_TABLET | Freq: Every day | ORAL | 3 refills | Status: DC
  Filled 2022-09-27 (×2): qty 30, 30d supply, fill #0
  Filled 2022-10-25: qty 30, 30d supply, fill #1
  Filled 2022-12-05 (×2): qty 30, 30d supply, fill #2
  Filled 2023-01-10 (×2): qty 30, 30d supply, fill #3

## 2022-09-27 MED ORDER — SACUBITRIL-VALSARTAN 24-26 MG PO TABS
1.0000 | ORAL_TABLET | Freq: Two times a day (BID) | ORAL | 3 refills | Status: DC
  Filled 2022-09-27 (×2): qty 60, 30d supply, fill #0
  Filled 2022-10-25: qty 60, 30d supply, fill #1
  Filled 2022-12-05 (×2): qty 60, 30d supply, fill #2

## 2022-09-27 NOTE — Patient Instructions (Signed)
It was a pleasure seeing you today!  MEDICATIONS: -We are changing your medications today -Decrease torsemide 20 mg (1 tablet) daily -Call if you have questions about your medications.   NEXT APPOINTMENT: Return to clinic in 3 weeks with [facility].  In general, to take care of your heart failure: -Limit your fluid intake to 2 Liters (half-gallon) per day.   -Limit your salt intake to ideally 2-3 grams (2000-3000 mg) per day. -Weigh yourself daily and record, and bring that "weight diary" to your next appointment.  (Weight gain of 2-3 pounds in 1 day typically means fluid weight.) -The medications for your heart are to help your heart and help you live longer.   -Please contact us before stopping any of your heart medications.  Call the clinic at [phone] with questions or to reschedule future appointments.

## 2022-10-11 ENCOUNTER — Encounter: Payer: Self-pay | Admitting: Nurse Practitioner

## 2022-10-11 ENCOUNTER — Other Ambulatory Visit: Payer: Self-pay

## 2022-10-11 ENCOUNTER — Ambulatory Visit: Payer: Medicaid Other | Attending: Nurse Practitioner | Admitting: Nurse Practitioner

## 2022-10-11 ENCOUNTER — Other Ambulatory Visit (HOSPITAL_COMMUNITY): Payer: Self-pay

## 2022-10-11 VITALS — BP 101/70 | HR 86 | Ht 65.0 in | Wt 147.0 lb

## 2022-10-11 DIAGNOSIS — E1165 Type 2 diabetes mellitus with hyperglycemia: Secondary | ICD-10-CM

## 2022-10-11 DIAGNOSIS — R7989 Other specified abnormal findings of blood chemistry: Secondary | ICD-10-CM

## 2022-10-11 DIAGNOSIS — F32A Depression, unspecified: Secondary | ICD-10-CM | POA: Diagnosis not present

## 2022-10-11 DIAGNOSIS — I10 Essential (primary) hypertension: Secondary | ICD-10-CM | POA: Diagnosis not present

## 2022-10-11 DIAGNOSIS — F419 Anxiety disorder, unspecified: Secondary | ICD-10-CM | POA: Diagnosis not present

## 2022-10-11 DIAGNOSIS — Z09 Encounter for follow-up examination after completed treatment for conditions other than malignant neoplasm: Secondary | ICD-10-CM

## 2022-10-11 DIAGNOSIS — Z794 Long term (current) use of insulin: Secondary | ICD-10-CM

## 2022-10-11 MED ORDER — BLOOD PRESSURE MONITOR DEVI
0 refills | Status: DC
Start: 2022-10-11 — End: 2023-08-16

## 2022-10-11 MED ORDER — HYDROXYZINE HCL 50 MG PO TABS
50.0000 mg | ORAL_TABLET | Freq: Three times a day (TID) | ORAL | 2 refills | Status: AC | PRN
Start: 2022-10-11 — End: ?
  Filled 2022-10-11 (×3): qty 90, 30d supply, fill #0

## 2022-10-11 NOTE — Patient Instructions (Signed)
[facility] [address]  [phone] Open ? Closes 6?PM

## 2022-10-11 NOTE — Progress Notes (Signed)
Assessment & Plan:  Kendra Griffith was seen today for hospitalization follow-up.  Diagnoses and all orders for this visit:  Hospital discharge follow-up  Type 2 diabetes mellitus with hyperglycemia, without long-term current use of insulin (HCC) -     Microalbumin / creatinine urine ratio -     CMP14+EGFR Continue blood sugar control as discussed in office today, low carbohydrate diet, and regular physical exercise as tolerated, 150 minutes per week (30 min each day, 5 days per week, or 50 min 3 days per week). Keep blood sugar logs with fasting goal of 90-130 mg/dl, post prandial (after you eat) less than 180.  For Hypoglycemia: BS <60 and Hyperglycemia BS >400; contact the clinic ASAP. Annual eye exams and foot exams are recommended.   Primary hypertension -     Blood Pressure Monitor DEVI; Please provide patient with insurance approved blood pressure monitor Continue all antihypertensives as prescribed.  Reminded to bring in blood pressure log for follow  up appointment.  RECOMMENDATIONS: DASH/Mediterranean Diets are healthier choices for HTN.    Anxiety and depression -     Ambulatory referral to Behavioral Health -     hydrOXYzine (ATARAX) 50 MG tablet; Take 1 tablet (50 mg total) by mouth 3 (three) times daily as needed. Please deliver  Abnormal CBC -     CBC with Differential    Patient has been counseled on age-appropriate routine health concerns for screening and prevention. These are reviewed and up-to-date. Referrals have been placed accordingly. Immunizations are up-to-date or declined.    Subjective:   Chief Complaint  Patient presents with   Hospitalization Follow-up   HPI Kendra Griffith 57 y.o. female presents to office today for HFU  She has a past medical history of ABSCESS, TOOTH (08/20/2009), Anemia, Anxiety, Bipolar disorder (HCC), Chronic pain, Clotting disorder, Dental caries, Depression, DM2, GERD, Hypertension, Pre-diabetes, Sickle cell trait      HFU Admitted from 08-25-2022 through 08-31-2022 with acute systolic heart failure with nonischemic cardiomyopathy. PER HOSPITAL DC SUMMARY: 2D echo: EF < 20%, global hypokinesis, grade 3 DD, right ventricular systolic function moderately reduced, moderately elevated pulmonary artery systolic pressure, moderate TR. - R/L cardiac cath showed no CAD, elevated filling pressures, normal output. -VQ scan negative for PE, -Diuresed with IV Lasix, volume status has improved, followed by heart failure team this admission -Continue Entresto Farxiga, Aldactone, digoxin and torsemide 20 Mg alternating with 40 Mg daily -Plan for cardiac MRI as outpatient as she received IV iron this admission -Discharged home in stable condition, follow-up with heart failure clinic in 1 to 2 weeks, BMP in 1 week She is currently being followed by Cardiology. Blood pressure is low normal today. She is essentially asymptomatic. Does not endorse dyspnea or worsening BLE edema.   Today she notes increased anxiety and depression regarding current health issues. She is taking elavil and hydroxyzine as prescribed. She is open to referral for psychotherapy.   Review of Systems  Constitutional:  Negative for fever, malaise/fatigue and weight loss.  HENT: Negative.  Negative for nosebleeds.   Eyes: Negative.  Negative for blurred vision, double vision and photophobia.  Respiratory: Negative.  Negative for cough and shortness of breath.   Cardiovascular: Negative.  Negative for chest pain, palpitations and leg swelling.  Gastrointestinal: Negative.  Negative for heartburn, nausea and vomiting.  Musculoskeletal: Negative.  Negative for myalgias.  Neurological: Negative.  Negative for dizziness, focal weakness, seizures and headaches.  Psychiatric/Behavioral:  Positive for depression. Negative for suicidal ideas.  The patient is nervous/anxious.     Past Medical History:  Diagnosis Date   ABSCESS, TOOTH 08/20/2009   Qualifier:  Diagnosis of  By: Huntley Dec, Scott     Anemia    Anxiety    Bipolar disorder (HCC)    Blood transfusion without reported diagnosis    childbirth   Chronic pain    feet and back   Clotting disorder (HCC)    childbirth   Dental caries    lesion soft palate   Depression    Diabetes mellitus without complication (HCC)    prediabetes   GERD (gastroesophageal reflux disease)    Hypertension    Injury    right side from jumping off bunkbed   Pre-diabetes    Sickle cell trait (HCC)    Wears glasses     Past Surgical History:  Procedure Laterality Date   BARTHOLIN CYST MARSUPIALIZATION N/A 07/16/2019   Procedure: EXCISION OF BARTHOLIN CYST;  Surgeon: Hermina Staggers, MD;  Location: [facility];  Service: Gynecology;  Laterality: N/A;   CESAREAN SECTION     three   COLONOSCOPY     cyst removal 1997     FACIAL RECONSTRUCTION SURGERY     FOOT SURGERY Bilateral    LESION REMOVAL Left 10/15/2018   Procedure: Lesion Removal left soft palate;  Surgeon: Ocie Doyne, DDS;  Location: [facility] OR;  Service: Oral Surgery;  Laterality: Left;   RIGHT/LEFT HEART CATH AND CORONARY ANGIOGRAPHY N/A 08/29/2022   Procedure: RIGHT/LEFT HEART CATH AND CORONARY ANGIOGRAPHY;  Surgeon: Dolores Patty, MD;  Location: [facility];  Service: Cardiovascular;  Laterality: N/A;   TOOTH EXTRACTION Bilateral 10/15/2018   Procedure: DENTAL EXTRACTIONS OF TEETH NUMBER TWO, FOUR, FIVE, SIX, SEVEN, EIGHT, NINE, TEN, ELEVEN, TWELVE, THIRTEEN, FOURTEEN, SEVENTEEN, TWENTY-ONE, TWENTY-TWO, TWENTY-THREE, TWENTY-FOUR, TWENTY-FIVE, TWENTY-SIX, TWENTY-SEVEN, TWENTY-EIGHT WITH ALVEOLOPLASTY;  Surgeon: Ocie Doyne, DDS;  Location: [facility] OR;  Service: Oral Surgery;  Laterality: Bilateral;   TUBAL LIGATION      Family History  Problem Relation Age of Onset   Heart disease Mother    Asthma Sister    Diabetes Sister    COPD Sister    Diabetes Sister    Diabetes Sister    Breast cancer Neg Hx    Colon cancer  Neg Hx    Esophageal cancer Neg Hx    Pancreatic cancer Neg Hx    Liver disease Neg Hx    Stomach cancer Neg Hx     Social History Reviewed with no changes to be made today.   Outpatient Medications Prior to Visit  Medication Sig Dispense Refill   acetaminophen-codeine (TYLENOL #3) 300-30 MG tablet Take 1-2 tablets by mouth every 4 (four) hours as needed for moderate pain. 60 tablet 0   albuterol (VENTOLIN HFA) 108 (90 Base) MCG/ACT inhaler Inhale 2 puffs into the lungs every 6 (six) hours as needed for wheezing or shortness of breath. 18 g 0   amitriptyline (ELAVIL) 75 MG tablet Take 1 tablet (75 mg total) by mouth at bedtime for pain and depression 90 tablet 1   apixaban (ELIQUIS) 5 MG TABS tablet Take 1 tablet (5 mg total) by mouth 2 (two) times daily. 60 tablet 6   Biotin 5 MG CAPS Take 5 mg by mouth 3 (three) times daily.     cholecalciferol (VITAMIN D3) 25 MCG (1000 UT) tablet Take 1,000 Units by mouth daily.     dapagliflozin propanediol (FARXIGA) 10 MG TABS tablet Take  1 tablet (10 mg total) by mouth daily. 30 tablet 6   famotidine (PEPCID) 20 MG tablet Take 1 tablet (20 mg total) by mouth 2 (two) times daily for heartburn 180 tablet 1   gabapentin (NEURONTIN) 300 MG capsule Take 1 capsule (300 mg total) by mouth 2 (two) times daily. 180 capsule 3   GLUTAMINE PO Take 500 mg by mouth daily.     Misc. Devices MISC Please provide patient with incontinence pads or disposable underwear or choice. N39.3 1 each PRN   omeprazole (PRILOSEC) 20 MG capsule Take 1 capsule (20 mg total) by mouth daily for heartburn 90 capsule 1   potassium chloride SA (KLOR-CON M) 20 MEQ tablet Take 1 tablet (20 mEq total) by mouth daily. 30 tablet 3   rosuvastatin (CRESTOR) 5 MG tablet Take 1 tablet (5 mg total) by mouth daily for cholesterol 90 tablet 1   sacubitril-valsartan (ENTRESTO) 24-26 MG Take 1 tablet by mouth 2 (two) times daily. 60 tablet 3   spironolactone (ALDACTONE) 25 MG tablet Take 1 tablet (25  mg total) by mouth daily. 30 tablet 3   torsemide (DEMADEX) 20 MG tablet Take 1 tablet (20 mg total) by mouth daily. 30 tablet 3   vitamin B-12 (CYANOCOBALAMIN) 500 MCG tablet Take 500 mcg by mouth daily.     vitamin C (ASCORBIC ACID) 500 MG tablet Take 500 mg by mouth daily.     hydrOXYzine (ATARAX) 25 MG tablet Take 1 tablet (25 mg total) by mouth 3 (three) times daily as needed. (Patient taking differently: Take 25 mg by mouth 3 (three) times daily as needed for anxiety.) 90 tablet 1   No facility-administered medications prior to visit.    Allergies  Allergen Reactions   Amoxicillin     Pain Did it involve swelling of the face/tongue/throat, SOB, or low BP? No Did it involve sudden or severe rash/hives, skin peeling, or any reaction on the inside of your mouth or nose? No Did you need to seek medical attention at a hospital or doctor's office? Yes When did it last happen?      5 years ago If all above answers are "NO", may proceed with cephalosporin use.    Darvocet [Propoxyphene N-Acetaminophen] Nausea And Vomiting    Makes her jittery   Hydrocodone-Acetaminophen Nausea And Vomiting    Upset stomach   Percocet [Oxycodone-Acetaminophen]     Makes her jittery   Valium [Diazepam] Other (See Comments)    Makes pt. Feel "out of wack"       Objective:    BP 101/70 (BP Location: Left Arm, Patient Position: Sitting, Cuff Size: Normal)   Pulse 86   Wt 147 lb (66.7 kg)   LMP 03/12/2019 (Approximate)   SpO2 99%   BMI 24.46 kg/m  Wt Readings from Last 3 Encounters:  10/11/22 147 lb (66.7 kg)  09/27/22 144 lb 6.4 oz (65.5 kg)  09/07/22 144 lb 2 oz (65.4 kg)    Physical Exam Vitals and nursing note reviewed.  Constitutional:      Appearance: She is well-developed.  HENT:     Head: Normocephalic and atraumatic.  Cardiovascular:     Rate and Rhythm: Normal rate and regular rhythm.     Heart sounds: Normal heart sounds. No murmur heard.    No friction rub. No gallop.   Pulmonary:     Effort: Pulmonary effort is normal. No tachypnea or respiratory distress.     Breath sounds: Normal breath sounds. No decreased breath  sounds, wheezing, rhonchi or rales.  Chest:     Chest wall: No tenderness.  Abdominal:     General: Bowel sounds are normal.     Palpations: Abdomen is soft.  Musculoskeletal:        General: Normal range of motion.     Cervical back: Normal range of motion.  Skin:    General: Skin is warm and dry.  Neurological:     Mental Status: She is alert and oriented to person, place, and time.     Coordination: Coordination normal.  Psychiatric:        Behavior: Behavior normal. Behavior is cooperative.        Thought Content: Thought content normal.        Judgment: Judgment normal.          Patient has been counseled extensively about nutrition and exercise as well as the importance of adherence with medications and regular follow-up. The patient was given clear instructions to go to ER or return to medical center if symptoms don't improve, worsen or new problems develop. The patient verbalized understanding.   Follow-up: Return in about 3 months (around 01/11/2023) for can cancel August appt. since she was seen today.   Claiborne Rigg, FNP-BC [facility] [place], [place] [phone]   10/11/2022, 2:28 PM

## 2022-10-12 LAB — CMP14+EGFR
ALT: 20 IU/L (ref 0–32)
AST: 30 IU/L (ref 0–40)
Albumin: 4.8 g/dL (ref 3.8–4.9)
Alkaline Phosphatase: 119 IU/L (ref 44–121)
BUN/Creatinine Ratio: 13 (ref 9–23)
BUN: 14 mg/dL (ref 6–24)
Bilirubin Total: 0.3 mg/dL (ref 0.0–1.2)
CO2: 25 mmol/L (ref 20–29)
Calcium: 10.2 mg/dL (ref 8.7–10.2)
Chloride: 98 mmol/L (ref 96–106)
Creatinine, Ser: 1.07 mg/dL — ABNORMAL HIGH (ref 0.57–1.00)
Globulin, Total: 3 g/dL (ref 1.5–4.5)
Glucose: 100 mg/dL — ABNORMAL HIGH (ref 70–99)
Potassium: 4.6 mmol/L (ref 3.5–5.2)
Sodium: 140 mmol/L (ref 134–144)
Total Protein: 7.8 g/dL (ref 6.0–8.5)
eGFR: 61 mL/min/{1.73_m2} (ref >59)

## 2022-10-12 LAB — CBC WITH DIFFERENTIAL/PLATELET
Basophils Absolute: 0.1 10*3/uL (ref 0.0–0.2)
Basos: 1 %
EOS (ABSOLUTE): 0.2 10*3/uL (ref 0.0–0.4)
Eos: 3 %
Hematocrit: 42.6 % (ref 34.0–46.6)
Hemoglobin: 13.7 g/dL (ref 11.1–15.9)
Immature Grans (Abs): 0 10*3/uL (ref 0.0–0.1)
Immature Granulocytes: 0 %
Lymphocytes Absolute: 4.5 10*3/uL — ABNORMAL HIGH (ref 0.7–3.1)
Lymphs: 66 %
MCH: 26.1 pg — ABNORMAL LOW (ref 26.6–33.0)
MCHC: 32.2 g/dL (ref 31.5–35.7)
MCV: 81 fL (ref 79–97)
Monocytes Absolute: 0.4 10*3/uL (ref 0.1–0.9)
Monocytes: 6 %
Neutrophils Absolute: 1.6 10*3/uL (ref 1.4–7.0)
Neutrophils: 24 %
Platelets: 169 10*3/uL (ref 150–450)
RBC: 5.24 x10E6/uL (ref 3.77–5.28)
RDW: 15.1 % (ref 11.7–15.4)
WBC: 6.7 10*3/uL (ref 3.4–10.8)

## 2022-10-12 LAB — MICROALBUMIN / CREATININE URINE RATIO
Creatinine, Urine: 19.7 mg/dL
Microalb/Creat Ratio: <15 mg/g creat (ref 0–29)
Microalbumin, Urine: <3 ug/mL

## 2022-10-19 ENCOUNTER — Encounter (HOSPITAL_COMMUNITY): Payer: Medicaid Other

## 2022-10-24 ENCOUNTER — Telehealth: Payer: Self-pay | Admitting: Nurse Practitioner

## 2022-10-24 NOTE — Telephone Encounter (Signed)
Pt is calling to request letter to be written to [facility] stating due to health patient is unable to work out for at least 9 months. Pt would like letter to be uploaded into her Mychart. C- [phone]

## 2022-10-24 NOTE — Progress Notes (Signed)
Advanced Heart Failure Clinic Note   Referring Physician: PCP: Claiborne Rigg, NP PCP-Cardiologist: Dr. Shirlee Latch   Reason for Visit: Hospital F/u for Systolic Heart Failure   HPI:  57 y/o AAF heavy smoker with diabetes and a remote history of ETOH abuse, recently admitted w/ new acute systolic heart failure and LE DVT.   She has had progressive exertional dyspnea for several months, worse recently and rare atypical chest pain. Presented to [facility] 5/24 for evaluation and found to be in acute CHF. Echo 5/24 EF <20%, GIIIDD, RV mod reduced, mod elevated PASP ~58, LA mod dilated, RA mildly dilated, mild MR, mod TR. Hs trops negative. She was diuresed w/ IV Lasix and started on GDMT. Heparin started for LE DVT. V/Q scan showed no perfusion defects to suggest PE. She underwent R/LHC which showed normal coronary arteries, severe NICM EF < 20% w/ elevated filling pressures with low cardiac output (RA 6, PA 47/24/33, PA sat 57%, FICK CI 2.1). She was diuresed further w/ IV Lasix and transitioned to PO torsemide. GDMT further titrated. Switched to Eliquis after completion of cath. Hematological w/u   cMRI was recommended, however unable to get study completed inpatient as she had been administered IV Fe (Ferrlecit) for IDA earlier during admit. Recommended to complete outpatient. On 5/22, she was felt to be stable for d/c home. Volume status improved. Dyspnea resolved. CVP was 5 and co-ox 61% day of d/c. D/c wt 145 lb.   She has done fairly well since d/c. Last several visits back in the [facility] we have been attempting to titrate up GDMT, but have been limited by soft BPs.   She presents back today for f/u and attempt at med titration.   cMRI has been scheduled for 8/5.   She presents today for post hospital f/u. Reports doing well. Improved symptoms. Able to do ADLs w/o significant dyspnea. Reports full med compliance. BP low normal, 90/64, but no orthostatic symptoms. Wt stable, down 1 lb since  discharge. EKG shows NSR w/ incomplete LBBB, QRS duration 118 bpm. Fully compliant w/ Eliquis. No abnormal bleeding. Has new pt appt w/ new PCP tomorrow. Still smoking. Quitting has been hard, especially difficult after recent unexpected family death. She plans to talk to PCP tomorrow regarding smoking cessation aids.       Cardiovascular Studies  2D Echo 5/24  1. Left ventricular ejection fraction, by estimation, is <20%. The left  ventricle has severely decreased function. The left ventricle demonstrates  global hypokinesis. The left ventricular internal cavity size was  moderately dilated. Left ventricular  diastolic parameters are consistent with Grade III diastolic dysfunction  (restrictive).   2. Right ventricular systolic function is moderately reduced. The right  ventricular size is normal. There is moderately elevated pulmonary artery  systolic pressure. The estimated right ventricular systolic pressure is  58.0 mmHg.   3. Left atrial size was moderately dilated.   4. Right atrial size was mildly dilated.   5. The mitral valve is normal in structure. Mild mitral valve  regurgitation. No evidence of mitral stenosis.   6. Tricuspid valve regurgitation is moderate.   7. The aortic valve is tricuspid. There is mild calcification of the  aortic valve. Aortic valve regurgitation is not visualized. No aortic  stenosis is present.   8. The inferior vena cava is dilated in size with <50% respiratory  variability, suggesting right atrial pressure of 15 mmHg.   [facility] 5/24 Findings: Ao = 109/82 (94) LV = 105/29 RA =  6 RV = 46/8 PA = 47/24 (33) PCW = 23 Fick cardiac output/index = 3.4/2.1 PVR = 2.6 WU SVR = 1,831 FA sat = 93% PA sat = 54%, 57%   Assessment: 1. Normal coronary arteries 2. Severe NICM EF < 20% 3. Elevated filling pressures with low cardiac output    LE venous dopplers + for LLE DVT   V/Q scan - for PE      Review of Systems: [y] = yes, [ ]  = no    General: Weight gain [ ] ; Weight loss [ ] ; Anorexia [ ] ; Fatigue [ ] ; Fever [ ] ; Chills [ ] ; Weakness [ ]   Cardiac: Chest pain/pressure [ ] ; Resting SOB [ ] ; Exertional SOB [ ] ; Orthopnea [ ] ; Pedal Edema [ ] ; Palpitations [ ] ; Syncope [ ] ; Presyncope [ ] ; Paroxysmal nocturnal dyspnea[ ]   Pulmonary: Cough [ ] ; Wheezing[ ] ; Hemoptysis[ ] ; Sputum [ ] ; Snoring [ ]   GI: Vomiting[ ] ; Dysphagia[ ] ; Melena[ ] ; Hematochezia [ ] ; Heartburn[ ] ; Abdominal pain [ ] ; Constipation [ ] ; Diarrhea [ ] ; BRBPR [ ]   GU: Hematuria[ ] ; Dysuria [ ] ; Nocturia[ ]   Vascular: Pain in legs with walking [ ] ; Pain in feet with lying flat [ ] ; Non-healing sores [ ] ; Stroke [ ] ; TIA [ ] ; Slurred speech [ ] ;  Neuro: Headaches[ ] ; Vertigo[ ] ; Seizures[ ] ; Paresthesias[ ] ;Blurred vision [ ] ; Diplopia [ ] ; Vision changes [ ]   Ortho/Skin: Arthritis [ ] ; Joint pain [ ] ; Muscle pain [ ] ; Joint swelling [ ] ; Back Pain [ ] ; Rash [ ]   Psych: Depression[ ] ; Anxiety[ ]   Heme: Bleeding problems [ ] ; Clotting disorders [ ] ; Anemia [ ]   Endocrine: Diabetes [Y ]; Thyroid dysfunction[ ]    Past Medical History:  Diagnosis Date   ABSCESS, TOOTH 08/20/2009   Qualifier: Diagnosis of  By: Huntley Dec, Scott     Anemia    Anxiety    Bipolar disorder (HCC)    Blood transfusion without reported diagnosis    childbirth   Chronic pain    feet and back   Clotting disorder (HCC)    childbirth   Dental caries    lesion soft palate   Depression    Diabetes mellitus without complication (HCC)    prediabetes   GERD (gastroesophageal reflux disease)    Hypertension    Injury    right side from jumping off bunkbed   Pre-diabetes    Sickle cell trait (HCC)    Wears glasses     Current Outpatient Medications  Medication Sig Dispense Refill   acetaminophen-codeine (TYLENOL #3) 300-30 MG tablet Take 1-2 tablets by mouth every 4 (four) hours as needed for moderate pain. 60 tablet 0   albuterol (VENTOLIN HFA) 108 (90 Base) MCG/ACT inhaler  Inhale 2 puffs into the lungs every 6 (six) hours as needed for wheezing or shortness of breath. 18 g 0   amitriptyline (ELAVIL) 75 MG tablet Take 1 tablet (75 mg total) by mouth at bedtime for pain and depression 90 tablet 1   apixaban (ELIQUIS) 5 MG TABS tablet Take 1 tablet (5 mg total) by mouth 2 (two) times daily. 60 tablet 6   Biotin 5 MG CAPS Take 5 mg by mouth 3 (three) times daily.     Blood Pressure Monitor DEVI Please provide patient with insurance approved blood pressure monitor 1 each 0   cholecalciferol (VITAMIN D3) 25 MCG (1000 UT) tablet Take 1,000 Units by mouth daily.  dapagliflozin propanediol (FARXIGA) 10 MG TABS tablet Take 1 tablet (10 mg total) by mouth daily. 30 tablet 6   famotidine (PEPCID) 20 MG tablet Take 1 tablet (20 mg total) by mouth 2 (two) times daily for heartburn 180 tablet 1   gabapentin (NEURONTIN) 300 MG capsule Take 1 capsule (300 mg total) by mouth 2 (two) times daily. 180 capsule 3   GLUTAMINE PO Take 500 mg by mouth daily.     hydrOXYzine (ATARAX) 50 MG tablet Take 1 tablet (50 mg total) by mouth 3 (three) times daily as needed. Please deliver 90 tablet 2   Misc. Devices MISC Please provide patient with incontinence pads or disposable underwear or choice. N39.3 1 each PRN   omeprazole (PRILOSEC) 20 MG capsule Take 1 capsule (20 mg total) by mouth daily for heartburn 90 capsule 1   potassium chloride SA (KLOR-CON M) 20 MEQ tablet Take 1 tablet (20 mEq total) by mouth daily. 30 tablet 3   rosuvastatin (CRESTOR) 5 MG tablet Take 1 tablet (5 mg total) by mouth daily for cholesterol 90 tablet 1   sacubitril-valsartan (ENTRESTO) 24-26 MG Take 1 tablet by mouth 2 (two) times daily. 60 tablet 3   spironolactone (ALDACTONE) 25 MG tablet Take 1 tablet (25 mg total) by mouth daily. 30 tablet 3   torsemide (DEMADEX) 20 MG tablet Take 1 tablet (20 mg total) by mouth daily. 30 tablet 3   vitamin B-12 (CYANOCOBALAMIN) 500 MCG tablet Take 500 mcg by mouth daily.      vitamin C (ASCORBIC ACID) 500 MG tablet Take 500 mg by mouth daily.     No current facility-administered medications for this visit.    Allergies  Allergen Reactions   Amoxicillin     Pain Did it involve swelling of the face/tongue/throat, SOB, or low BP? No Did it involve sudden or severe rash/hives, skin peeling, or any reaction on the inside of your mouth or nose? No Did you need to seek medical attention at a hospital or doctor's office? Yes When did it last happen?      5 years ago If all above answers are "NO", may proceed with cephalosporin use.    Darvocet [Propoxyphene N-Acetaminophen] Nausea And Vomiting    Makes her jittery   Hydrocodone-Acetaminophen Nausea And Vomiting    Upset stomach   Percocet [Oxycodone-Acetaminophen]     Makes her jittery   Valium [Diazepam] Other (See Comments)    Makes pt. Feel "out of wack"      Social History   Socioeconomic History   Marital status: Single    Spouse name: Not on file   Number of children: 3   Years of education: 8-9   Highest education level: 8th grade  Occupational History   Occupation: applying for disability  Tobacco Use   Smoking status: Every Day    Current packs/day: 2.00    Average packs/day: 2.0 packs/day for 29.0 years (58.0 ttl pk-yrs)    Types: Cigarettes   Smokeless tobacco: Never  Vaping Use   Vaping status: Never Used  Substance and Sexual Activity   Alcohol use: No    Alcohol/week: 0.0 standard drinks of alcohol    Comment: Stopped drinking 2013   Drug use: No   Sexual activity: Not Currently    Birth control/protection: None  Other Topics Concern   Not on file  Social History Narrative   Patient lives alone in an apartment on the first floor.  3 children.  Applying for disability.  Education: 8th or 9th grade.   Currently not working   Trying to go back to school online for [employer]   Six Grandchildren   Social Determinants of Health   Financial Resource Strain: Medium Risk (09/02/2022)    Overall Financial Resource Strain (CARDIA)    Difficulty of Paying Living Expenses: Somewhat hard  Food Insecurity: Food Insecurity Present (09/02/2022)   Hunger Vital Sign    Worried About Running Out of Food in the Last Year: Sometimes true    Ran Out of Food in the Last Year: Sometimes true  Transportation Needs: Unmet Transportation Needs (09/02/2022)   PRAPARE - Administrator, Civil Service (Medical): Yes    Lack of Transportation (Non-Medical): Yes  Physical Activity: Insufficiently Active (09/02/2022)   Exercise Vital Sign    Days of Exercise per Week: 1 day    Minutes of Exercise per Session: 30 min  Stress: Stress Concern Present (09/02/2022)   [employer] of Occupational Health - Occupational Stress Questionnaire    Feeling of Stress : To some extent  Social Connections: Moderately Isolated (09/02/2022)   Social Connection and Isolation Panel [NHANES]    Frequency of Communication with Friends and Family: Three times a week    Frequency of Social Gatherings with Friends and Family: Once a week    Attends Religious Services: Never    Database administrator or Organizations: Yes    Attends Banker Meetings: 1 to 4 times per year    Marital Status: Divorced  Intimate Partner Violence: Unknown (06/08/2017)   Humiliation, Afraid, Rape, and Kick questionnaire    Fear of Current or Ex-Partner: Patient declined    Emotionally Abused: Patient declined    Physically Abused: Patient declined    Sexually Abused: Patient declined      Family History  Problem Relation Age of Onset   Heart disease Mother    Asthma Sister    Diabetes Sister    COPD Sister    Diabetes Sister    Diabetes Sister    Breast cancer Neg Hx    Colon cancer Neg Hx    Esophageal cancer Neg Hx    Pancreatic cancer Neg Hx    Liver disease Neg Hx    Stomach cancer Neg Hx     There were no vitals filed for this visit.    PHYSICAL EXAM: General:  Well appearing. No  respiratory difficulty HEENT: normal Neck: supple. no JVD. Carotids 2+ bilat; no bruits. No lymphadenopathy or thyromegaly appreciated. Cor: PMI nondisplaced. Regular rate & rhythm. No rubs, gallops or murmurs. Lungs: clear Abdomen: soft, nontender, nondistended. No hepatosplenomegaly. No bruits or masses. Good bowel sounds. Extremities: no cyanosis, clubbing, rash, edema Neuro: alert & oriented x 3, cranial nerves grossly intact. moves all 4 extremities w/o difficulty. Affect pleasant.  ECG: NSR w/ incomplete LBBB, QRS duration 118 bpm   ASSESSMENT & PLAN:  1. Chronic Systolic CHF:  New diagnosis. Echo 5/24 EF <20%, GIIIDD, RV mod reduced, mod elevated PASP ~58, LA mod dilated, RA mildly dilated, mild MR, mod TR. NICM. R/LHC c/w severe NICM, demonstrating normal cors, elevated filling pressures and low output, CI 2.1, PCWP 23. She was diuresed down to dry wt of 145 lb and placed on GDMT. Etiology for CM uncertain. Previous hx heavy alcohol use but quit a number of years ago. HIV negative.  No recent viral-type illness. She does have a FH of CHF (sister and mother). - plan cMRI for further assessment  of HF (scheduled 11/14/22)  - NYHA Class II - Volume good. Euvolemic on exam.  - Continue Spironolactone 25 mg daily  - Continue Farxiga 10 daily - Continue Entresto 24/26. BP too soft for titration  - Off digoxin due to elevated level  - Check BMP and BNP today  - On Torsemide 20 mg daily  - continue GDMT titration q2-3 wks until fully optimized. Repeat echo in 3 months. Will need referral to EP for ICD if EF remains < 35%   2.  HTN: controlled on current regimen - GDMT per above - check BMP today    3. DMII: Hgb A1c 6.5 2/24 - recently started on Farxiga - further management per PCP   4. Tobacco Abuse: Currently heavy smoker, 2 PPD. Has smoked since she was 11. - smoking cessation advised  - has f/u w/ PCP tomorrow, will discuss cessation aides   5. Acute DVT: Left leg DVT found  5/24.  Had DVT back in 1980s after she delivered her son.   - V/Q scan negative for significant perfusion defects  - Hypercoagulable w/u in process, checking factor V Leiden, prothrombin gene mutation, and antiphospholipid antibody>>PCP will f/u on results  - On treatment dose Eliquis  7. Fe deficiency anemia: given IV Fe during recent hospitalization  -  will need this followed by PCP, has f/u tomorrow, consider FOBT  - on Eliquis for DVT. Denies abnormal bleeding. Check CBC today   F/u w/ pharmD in 2-3 wks for further med titration, APP in 5-6 wks, 3 months w/ Dr. Shirlee Latch + echo    Anderson [place], [place] 10/24/22

## 2022-10-24 NOTE — Telephone Encounter (Signed)
Attempted to contact Kendra Griffith at 230pm. No answer. Im not sure why she needs a letter and I can not say she can't work out for at least 9 months. Is there a contact person for me to speak to at [facility]? This is not something that is normally done in regard to the letter she is requesting and I do not believe she has been restricted from any activity per Cardiology. She may need to speak to cardiology about this if she is saying she can't work out they will need to note that she is restricted in activity first.

## 2022-10-25 ENCOUNTER — Telehealth: Payer: Self-pay

## 2022-10-25 ENCOUNTER — Other Ambulatory Visit: Payer: Self-pay

## 2022-10-25 NOTE — Telephone Encounter (Signed)
Return call to patient unanswered.

## 2022-10-25 NOTE — Telephone Encounter (Signed)
Copied from CRM [id number]. Topic: General - Other >> Oct 25, 2022  1:56 PM Everette C wrote: Reason for CRM: The patient has requested contact with their PCP when possible to follow up on a previous request documentation   The patient shares that they received a missed call from the practice previously as well  Please contact when possible

## 2022-10-26 ENCOUNTER — Encounter (HOSPITAL_COMMUNITY): Payer: Self-pay

## 2022-10-26 ENCOUNTER — Ambulatory Visit (HOSPITAL_COMMUNITY)
Admission: RE | Admit: 2022-10-26 | Discharge: 2022-10-26 | Disposition: A | Discharge status: Home / Self Care | Payer: Medicaid Other | Source: Ambulatory Visit | Attending: Family Medicine | Admitting: Family Medicine

## 2022-10-26 VITALS — BP 96/64 | HR 90 | Wt 146.4 lb

## 2022-10-26 DIAGNOSIS — I447 Left bundle-branch block, unspecified: Secondary | ICD-10-CM | POA: Insufficient documentation

## 2022-10-26 DIAGNOSIS — F419 Anxiety disorder, unspecified: Secondary | ICD-10-CM | POA: Diagnosis not present

## 2022-10-26 DIAGNOSIS — Z7984 Long term (current) use of oral hypoglycemic drugs: Secondary | ICD-10-CM | POA: Insufficient documentation

## 2022-10-26 DIAGNOSIS — Z7901 Long term (current) use of anticoagulants: Secondary | ICD-10-CM | POA: Insufficient documentation

## 2022-10-26 DIAGNOSIS — I428 Other cardiomyopathies: Secondary | ICD-10-CM | POA: Diagnosis not present

## 2022-10-26 DIAGNOSIS — Z79899 Other long term (current) drug therapy: Secondary | ICD-10-CM | POA: Insufficient documentation

## 2022-10-26 DIAGNOSIS — I110 Hypertensive heart disease with heart failure: Secondary | ICD-10-CM | POA: Diagnosis not present

## 2022-10-26 DIAGNOSIS — Z8249 Family history of ischemic heart disease and other diseases of the circulatory system: Secondary | ICD-10-CM | POA: Diagnosis not present

## 2022-10-26 DIAGNOSIS — Z5941 Food insecurity: Secondary | ICD-10-CM | POA: Diagnosis not present

## 2022-10-26 DIAGNOSIS — F319 Bipolar disorder, unspecified: Secondary | ICD-10-CM | POA: Diagnosis not present

## 2022-10-26 DIAGNOSIS — Z720 Tobacco use: Secondary | ICD-10-CM | POA: Diagnosis not present

## 2022-10-26 DIAGNOSIS — D509 Iron deficiency anemia, unspecified: Secondary | ICD-10-CM | POA: Diagnosis not present

## 2022-10-26 DIAGNOSIS — E119 Type 2 diabetes mellitus without complications: Secondary | ICD-10-CM | POA: Insufficient documentation

## 2022-10-26 DIAGNOSIS — I10 Essential (primary) hypertension: Secondary | ICD-10-CM | POA: Diagnosis not present

## 2022-10-26 DIAGNOSIS — I5022 Chronic systolic (congestive) heart failure: Secondary | ICD-10-CM | POA: Diagnosis not present

## 2022-10-26 DIAGNOSIS — Z86718 Personal history of other venous thrombosis and embolism: Secondary | ICD-10-CM | POA: Insufficient documentation

## 2022-10-26 DIAGNOSIS — F17210 Nicotine dependence, cigarettes, uncomplicated: Secondary | ICD-10-CM | POA: Insufficient documentation

## 2022-10-26 DIAGNOSIS — Z139 Encounter for screening, unspecified: Secondary | ICD-10-CM

## 2022-10-26 DIAGNOSIS — Z5986 Financial insecurity: Secondary | ICD-10-CM | POA: Diagnosis not present

## 2022-10-26 DIAGNOSIS — I82532 Chronic embolism and thrombosis of left popliteal vein: Secondary | ICD-10-CM

## 2022-10-26 LAB — BASIC METABOLIC PANEL
Anion gap: 7 (ref 5–15)
BUN: 14 mg/dL (ref 6–20)
CO2: 28 mmol/L (ref 22–32)
Calcium: 9.7 mg/dL (ref 8.9–10.3)
Chloride: 102 mmol/L (ref 98–111)
Creatinine, Ser: 1.09 mg/dL — ABNORMAL HIGH (ref 0.44–1.00)
GFR, Estimated: 60 mL/min — ABNORMAL LOW (ref >60)
Glucose, Bld: 103 mg/dL — ABNORMAL HIGH (ref 70–99)
Potassium: 4 mmol/L (ref 3.5–5.1)
Sodium: 137 mmol/L (ref 135–145)

## 2022-10-26 LAB — BRAIN NATRIURETIC PEPTIDE: B Natriuretic Peptide: 76.6 pg/mL (ref 0.0–100.0)

## 2022-10-26 MED ORDER — SPIRONOLACTONE 25 MG PO TABS
25.0000 mg | ORAL_TABLET | Freq: Every day | ORAL | 3 refills | Status: DC
Start: 2022-10-26 — End: 2022-12-01

## 2022-10-26 NOTE — Addendum Note (Signed)
Encounter addended by: Marcy Siren, LCSW on: 10/26/2022 2:57 PM  Actions taken: Flowsheet accepted, Clinical Note Signed

## 2022-10-26 NOTE — Patient Instructions (Addendum)
Labs today - will call you if abnormal. Keep your future appointments for cardiac MRI, echocardiogram, and appointment with Dr. Shirlee Latch.  Referral sent to [facility] per Dr. Shirlee Latch.  Please move taking your Kendra Griffith to evening. Met with Lasandra Beech LCSW today for assistance with food. Please call us at [phone] if any questions or issues prior to your next appointment.

## 2022-10-26 NOTE — Progress Notes (Signed)
H&V Care Navigation CSW Progress Note  Clinical Social Worker met with patient to assist with food insecurity.  Patient is participating in a Managed Medicaid Plan:  Yes  CSW met with patient who shared that she is low on food. Patient states she gets about $40 in food stamps and struggles with monthly finances. She recently was diagnosed with HF and has had an increase in her expenses. She signed a Community education officer with [employer] and reports she has been trying to get relived of her membership with no success. Provider assisted with a letter documenting her health status and financial strain to provide to membership at [employer]. CSW assisted with a Food Bag to help bridge the gap of food insecurity.   Patient grateful for the support and assistance. Lasandra Beech, LCSW, CCSW-MCS [phone]   SDOH Screenings   Food Insecurity: Food Insecurity Present (10/26/2022)  Housing: Low Risk  (09/02/2022)  Transportation Needs: Unmet Transportation Needs (09/02/2022)  Utilities: Not At Risk (08/31/2022)  Depression (PHQ2-9): High Risk (10/11/2022)  Financial Resource Strain: Medium Risk (09/02/2022)  Physical Activity: Insufficiently Active (09/02/2022)  Social Connections: Moderately Isolated (09/02/2022)  Stress: Stress Concern Present (09/02/2022)  Tobacco Use: High Risk (10/26/2022)

## 2022-10-27 NOTE — Telephone Encounter (Signed)
Call placed to patient unable to reach message left on VM.   

## 2022-10-28 LAB — HM DIABETES EYE EXAM

## 2022-11-02 ENCOUNTER — Encounter (HOSPITAL_COMMUNITY): Payer: Self-pay

## 2022-11-07 ENCOUNTER — Ambulatory Visit (INDEPENDENT_AMBULATORY_CARE_PROVIDER_SITE_OTHER): Payer: Medicaid Other | Admitting: Podiatry

## 2022-11-07 ENCOUNTER — Encounter: Payer: Self-pay | Admitting: Podiatry

## 2022-11-07 ENCOUNTER — Other Ambulatory Visit: Payer: Self-pay | Admitting: Nurse Practitioner

## 2022-11-07 VITALS — BP 94/62 | HR 80

## 2022-11-07 DIAGNOSIS — B351 Tinea unguium: Secondary | ICD-10-CM

## 2022-11-07 DIAGNOSIS — M79675 Pain in left toe(s): Secondary | ICD-10-CM | POA: Diagnosis not present

## 2022-11-07 DIAGNOSIS — M216X1 Other acquired deformities of right foot: Secondary | ICD-10-CM

## 2022-11-07 DIAGNOSIS — Q828 Other specified congenital malformations of skin: Secondary | ICD-10-CM

## 2022-11-07 DIAGNOSIS — M79674 Pain in right toe(s): Secondary | ICD-10-CM | POA: Diagnosis not present

## 2022-11-07 DIAGNOSIS — Z1231 Encounter for screening mammogram for malignant neoplasm of breast: Secondary | ICD-10-CM

## 2022-11-07 DIAGNOSIS — E1142 Type 2 diabetes mellitus with diabetic polyneuropathy: Secondary | ICD-10-CM

## 2022-11-07 DIAGNOSIS — M216X2 Other acquired deformities of left foot: Secondary | ICD-10-CM

## 2022-11-07 NOTE — Progress Notes (Signed)
This patient returns to my office for at risk foot care.  This patient requires this care by a professional since this patient will be at risk due to having diabetes.  This patient is unable to cut nails herself since the patient cannot reach her nails.These nails are painful walking and wearing shoes.  This patient presents for at risk foot care today.  General Appearance  Alert, conversant and in no acute stress.  Vascular  Dorsalis pedis and posterior tibial  pulses are palpable  bilaterally.  Capillary return is within normal limits  bilaterally. Temperature is within normal limits  bilaterally.  Neurologic  Senn-Weinstein monofilament wire test within normal limits  bilaterally. Muscle power within normal limits bilaterally.  Nails Thick disfigured discolored nails with subungual debris  from hallux to fifth toes bilaterally. No evidence of bacterial infection or drainage bilaterally.  Orthopedic  No limitations of motion  feet .  No crepitus or effusions noted.  No bony pathology or digital deformities noted.  Skin  normotropic skin with no porokeratosis noted bilaterally.  No signs of infections or ulcers noted.  Porokeratosis sub 2,5.  Porokeratosis 1,5 right foot.  Onychomycosis  Pain in right toes  Pain in left toes  Consent was obtained for treatment procedures.   Mechanical debridement of nails 1-5  bilaterally performed with a nail nipper.  Filed with dremel without incident.   Callus done as courtesy.   Return office visit   10 weeks                  Told patient to return for periodic foot care and evaluation due to potential at risk complications.   Helane Gunther DPM

## 2022-11-08 ENCOUNTER — Encounter: Payer: Medicaid Other | Admitting: Psychology

## 2022-11-08 DIAGNOSIS — F418 Other specified anxiety disorders: Secondary | ICD-10-CM

## 2022-11-08 DIAGNOSIS — G894 Chronic pain syndrome: Secondary | ICD-10-CM | POA: Diagnosis present

## 2022-11-08 DIAGNOSIS — F3173 Bipolar disorder, in partial remission, most recent episode manic: Secondary | ICD-10-CM | POA: Diagnosis present

## 2022-11-08 DIAGNOSIS — R413 Other amnesia: Secondary | ICD-10-CM | POA: Diagnosis present

## 2022-11-08 DIAGNOSIS — G621 Alcoholic polyneuropathy: Secondary | ICD-10-CM | POA: Diagnosis present
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
## 2022-11-09 ENCOUNTER — Telehealth (HOSPITAL_COMMUNITY): Payer: Self-pay

## 2022-11-09 ENCOUNTER — Ambulatory Visit (HOSPITAL_COMMUNITY): Payer: Medicaid Other

## 2022-11-09 NOTE — Telephone Encounter (Signed)
Called pt regarding 800 appt. No answer, LM on VM.   Jonna Coup, MS, ACSM-CEP 11/09/2022 8:17 AM

## 2022-11-10 ENCOUNTER — Encounter (HOSPITAL_COMMUNITY): Payer: Self-pay

## 2022-11-11 ENCOUNTER — Telehealth (HOSPITAL_COMMUNITY): Payer: Self-pay | Admitting: *Deleted

## 2022-11-11 NOTE — Telephone Encounter (Signed)
Attempted to call patient regarding upcoming cardiac MRI appointment. °Left message on voicemail with name and callback number ° °Brandyn Thien RN Navigator Cardiac Imaging °[facility] °[phone] Office °[phone] Cell ° °

## 2022-11-14 ENCOUNTER — Ambulatory Visit (HOSPITAL_COMMUNITY)
Admission: RE | Admit: 2022-11-14 | Discharge: 2022-11-14 | Disposition: A | Discharge status: Home / Self Care | Payer: Medicaid Other | Source: Ambulatory Visit | Attending: Cardiology | Admitting: Cardiology

## 2022-11-14 ENCOUNTER — Encounter (HOSPITAL_COMMUNITY): Payer: Self-pay

## 2022-11-14 ENCOUNTER — Other Ambulatory Visit (HOSPITAL_COMMUNITY): Payer: Self-pay | Admitting: Cardiology

## 2022-11-14 DIAGNOSIS — I5022 Chronic systolic (congestive) heart failure: Secondary | ICD-10-CM

## 2022-11-14 MED ORDER — GADOBUTROL 1 MMOL/ML IV SOLN
6.0000 mL | Freq: Once | INTRAVENOUS | Status: AC | PRN
  Administered 2022-11-14: 6 mL via INTRAVENOUS

## 2022-11-15 ENCOUNTER — Ambulatory Visit: Payer: Medicaid Other

## 2022-11-15 DIAGNOSIS — M205X2 Other deformities of toe(s) (acquired), left foot: Secondary | ICD-10-CM

## 2022-11-15 DIAGNOSIS — E1142 Type 2 diabetes mellitus with diabetic polyneuropathy: Secondary | ICD-10-CM

## 2022-11-15 DIAGNOSIS — M216X2 Other acquired deformities of left foot: Secondary | ICD-10-CM

## 2022-11-15 NOTE — Progress Notes (Signed)
Patient presents to the office today for diabetic shoe and insole measuring.  Patient was measured with brannock device to determine size and width for 1 pair of extra depth shoes and foam casted for 3 pair of insoles.   Documentation of medical necessity will be sent to patient's treating diabetic doctor to verify and sign.   Patient's diabetic provider: Bertram Denver  Shoes and insoles will be ordered at that time and patient will be notified for an appointment for fitting when they arrive.   Shoe size (per patient): 10W Brannock measurement: 9.5W Patient shoe selection-  Shoe choice:   997  2nd [id number], 3rd [id number] Shoe size ordered: 10WD  Financial Form signed  Addison Bailey CPed, CFo ,CFm

## 2022-11-16 ENCOUNTER — Ambulatory Visit (HOSPITAL_COMMUNITY): Payer: Medicaid Other

## 2022-11-16 ENCOUNTER — Encounter (HOSPITAL_COMMUNITY)
Admission: RE | Admit: 2022-11-16 | Discharge: 2022-11-16 | Disposition: A | Discharge status: Home / Self Care | Payer: Medicaid Other | Source: Ambulatory Visit | Attending: Cardiology | Admitting: Cardiology

## 2022-11-16 VITALS — BP 93/64 | HR 72 | Ht 65.0 in | Wt 148.8 lb

## 2022-11-16 DIAGNOSIS — I5022 Chronic systolic (congestive) heart failure: Secondary | ICD-10-CM | POA: Insufficient documentation

## 2022-11-16 LAB — GLUCOSE, CAPILLARY: Glucose-Capillary: 91 mg/dL (ref 70–99)

## 2022-11-16 NOTE — Progress Notes (Signed)
Cardiac Individual Treatment Plan  Patient Details  Name: Kendra Griffith MRN: [id number] Date of Birth: 12-05-1965 Referring Provider:   Flowsheet Row INTENSIVE CARDIAC REHAB ORIENT from 11/16/2022 in [facility]  Referring Provider Marca Ancona, MD       Initial Encounter Date:  Flowsheet Row INTENSIVE CARDIAC REHAB ORIENT from 11/16/2022 in [facility]  Date 11/16/22       Visit Diagnosis: Heart failure, chronic systolic (HCC)  Patient's Home Medications on Admission:  Current Outpatient Medications:    acetaminophen-codeine (TYLENOL #3) 300-30 MG tablet, Take 1-2 tablets by mouth every 4 (four) hours as needed for moderate pain., Disp: 60 tablet, Rfl: 0   albuterol (VENTOLIN HFA) 108 (90 Base) MCG/ACT inhaler, Inhale 2 puffs into the lungs every 6 (six) hours as needed for wheezing or shortness of breath., Disp: 18 g, Rfl: 0   amitriptyline (ELAVIL) 75 MG tablet, Take 1 tablet (75 mg total) by mouth at bedtime for pain and depression, Disp: 90 tablet, Rfl: 1   apixaban (ELIQUIS) 5 MG TABS tablet, Take 1 tablet (5 mg total) by mouth 2 (two) times daily., Disp: 60 tablet, Rfl: 6   Biotin 5 MG CAPS, Take 5 mg by mouth 3 (three) times daily., Disp: , Rfl:    Blood Pressure Monitor DEVI, Please provide patient with insurance approved blood pressure monitor, Disp: 1 each, Rfl: 0   cholecalciferol (VITAMIN D3) 25 MCG (1000 UT) tablet, Take 1,000 Units by mouth daily., Disp: , Rfl:    dapagliflozin propanediol (FARXIGA) 10 MG TABS tablet, Take 1 tablet (10 mg total) by mouth daily., Disp: 30 tablet, Rfl: 6   famotidine (PEPCID) 20 MG tablet, Take 1 tablet (20 mg total) by mouth 2 (two) times daily for heartburn, Disp: 180 tablet, Rfl: 1   gabapentin (NEURONTIN) 300 MG capsule, Take 1 capsule (300 mg total) by mouth 2 (two) times daily., Disp: 180 capsule, Rfl: 3   GLUTAMINE  PO, Take 500 mg by mouth daily., Disp: , Rfl:    hydrOXYzine (ATARAX) 50 MG tablet, Take 1 tablet (50 mg total) by mouth 3 (three) times daily as needed. Please deliver, Disp: 90 tablet, Rfl: 2   Misc. Devices MISC, Please provide patient with incontinence pads or disposable underwear or choice. N39.3, Disp: 1 each, Rfl: PRN   omeprazole (PRILOSEC) 20 MG capsule, Take 1 capsule (20 mg total) by mouth daily for heartburn, Disp: 90 capsule, Rfl: 1   potassium chloride SA (KLOR-CON M) 20 MEQ tablet, Take 1 tablet (20 mEq total) by mouth daily., Disp: 30 tablet, Rfl: 3   rosuvastatin (CRESTOR) 5 MG tablet, Take 1 tablet (5 mg total) by mouth daily for cholesterol, Disp: 90 tablet, Rfl: 1   sacubitril-valsartan (ENTRESTO) 24-26 MG, Take 1 tablet by mouth 2 (two) times daily., Disp: 60 tablet, Rfl: 3   spironolactone (ALDACTONE) 25 MG tablet, Take 1 tablet (25 mg total) by mouth at bedtime., Disp: 90 tablet, Rfl: 3   torsemide (DEMADEX) 20 MG tablet, Take 1 tablet (20 mg total) by mouth daily., Disp: 30 tablet, Rfl: 3   vitamin B-12 (CYANOCOBALAMIN) 500 MCG tablet, Take 500 mcg by mouth daily., Disp: , Rfl:    vitamin C (ASCORBIC ACID) 500 MG tablet, Take 500 mg by mouth daily., Disp: , Rfl:   Past Medical History: Past Medical History:  Diagnosis Date   ABSCESS, TOOTH 08/20/2009  Qualifier: Diagnosis of  By: Huntley Dec, Scott     Anemia    Anxiety    Bipolar disorder (HCC)    Blood transfusion without reported diagnosis    childbirth   Chronic pain    feet and back   Clotting disorder (HCC)    childbirth   Dental caries    lesion soft palate   Depression    Diabetes mellitus without complication (HCC)    prediabetes   GERD (gastroesophageal reflux disease)    Hypertension    Injury    right side from jumping off bunkbed   Pre-diabetes    Sickle cell trait (HCC)    Wears glasses     Tobacco Use: Social History   Tobacco Use  Smoking Status Every Day   Current packs/day: 2.00    Average packs/day: 2.0 packs/day for 29.0 years (58.0 ttl pk-yrs)   Types: Cigarettes  Smokeless Tobacco Never    Labs: Review Flowsheet  More data exists      Latest Ref Rng & Units 08/27/2022 08/28/2022 08/29/2022 08/30/2022 08/31/2022  Labs for ITP Cardiac and Pulmonary Rehab  Cholestrol 0 - 200 mg/dL 77  - - - -  LDL (calc) 0 - 99 mg/dL 42  - - - -  HDL-C >08 mg/dL 20  - - - -  Trlycerides <150 mg/dL 75  - - - -  Hemoglobin A1c 4.8 - 5.6 % 6.1  - - - -  PH, Arterial 7.35 - 7.45 - - 7.453  - -  PCO2 arterial 32 - 48 mmHg - - 32.8  - -  Bicarbonate 20.0 - 28.0 mmol/L - - 28.0  28.5  23.0  - -  TCO2 22 - 32 mmol/L - - 29  30  24   - -  O2 Saturation % 80.6  56.5  67.8  91.8  57  54  93  60.2  61.5  61.4     Details       Multiple values from one day are sorted in reverse-chronological order         Capillary Blood Glucose: Lab Results  Component Value Date   GLUCAP 91 11/16/2022   GLUCAP 114 (H) 08/31/2022   GLUCAP 134 (H) 08/31/2022   GLUCAP 128 (H) 08/31/2022   GLUCAP 138 (H) 08/30/2022     Exercise Target Goals: Exercise Program Goal: Individual exercise prescription set using results from initial 6 min walk test and THRR while considering  patient's activity barriers and safety.   Exercise Prescription Goal: Initial exercise prescription builds to 30-45 minutes a day of aerobic activity, 2-3 days per week.  Home exercise guidelines will be given to patient during program as part of exercise prescription that the participant will acknowledge.  Activity Barriers & Risk Stratification:  Activity Barriers & Cardiac Risk Stratification - 11/16/22 6578       Activity Barriers & Cardiac Risk Stratification   Activity Barriers Back Problems;Deconditioning;Decreased Ventricular Function;Balance Concerns;Other (comment)    Comments neuropathy both feet    Cardiac Risk Stratification High   <5 METs on            6 Minute Walk:  6 Minute Walk     Row Name  11/16/22 1116         6 Minute Walk   Phase Initial     Distance 1180 feet     Walk Time 6 minutes     # of Rest Breaks 0  MPH 2.23     METS 3.59     RPE 7     Perceived Dyspnea  0     Symptoms Yes (comment)     Comments 10/10 bilateral feet pain-chronic. resolved with rest. used go cart     Resting HR 72 bpm     Resting BP 93/64     Resting Oxygen Saturation  100 %     Exercise Oxygen Saturation  during 6 min walk 100 %     Max Ex. HR 106 bpm     Max Ex. BP 107/73     2 Minute Post BP 95/65              Oxygen Initial Assessment:   Oxygen Re-Evaluation:   Oxygen Discharge (Final Oxygen Re-Evaluation):   Initial Exercise Prescription:  Initial Exercise Prescription - 11/16/22 1300       Date of Initial Exercise RX and Referring Provider   Date 11/16/22    Referring Provider Marca Ancona, MD    Expected Discharge Date 02/08/23      NuStep   Level 1    SPM 60    Minutes 15    METs 2      Recumbant Elliptical   Level 1    RPM 60    Watts 80    Minutes 15    METs 2.5      Prescription Details   Frequency (times per week) 3    Duration Progress to 30 minutes of continuous aerobic without signs/symptoms of physical distress      Intensity   THRR 40-80% of Max Heartrate 65-131    Ratings of Perceived Exertion 11-13    Perceived Dyspnea 0-4      Progression   Progression Continue progressive overload as per policy without signs/symptoms or physical distress.      Resistance Training   Training Prescription Yes    Weight 3    Reps 10-15             Perform Capillary Blood Glucose checks as needed.  Exercise Prescription Changes:   Exercise Comments:   Exercise Goals and Review:   Exercise Goals     Row Name 11/16/22 0802             Exercise Goals   Increase Physical Activity Yes       Intervention Provide advice, education, support and counseling about physical activity/exercise needs.;Develop an individualized  exercise prescription for aerobic and resistive training based on initial evaluation findings, risk stratification, comorbidities and participant's personal goals.       Expected Outcomes Short Term: Attend rehab on a regular basis to increase amount of physical activity.;Long Term: Exercising regularly at least 3-5 days a week.;Long Term: Add in home exercise to make exercise part of routine and to increase amount of physical activity.       Increase Strength and Stamina Yes       Intervention Provide advice, education, support and counseling about physical activity/exercise needs.;Develop an individualized exercise prescription for aerobic and resistive training based on initial evaluation findings, risk stratification, comorbidities and participant's personal goals.       Expected Outcomes Short Term: Perform resistance training exercises routinely during rehab and add in resistance training at home;Short Term: Increase workloads from initial exercise prescription for resistance, speed, and METs.;Long Term: Improve cardiorespiratory fitness, muscular endurance and strength as measured by increased METs and functional capacity ( )       Able to  understand and use rate of perceived exertion (RPE) scale Yes       Intervention Provide education and explanation on how to use RPE scale       Expected Outcomes Short Term: Able to use RPE daily in rehab to express subjective intensity level;Long Term:  Able to use RPE to guide intensity level when exercising independently       Knowledge and understanding of Target Heart Rate Range (THRR) Yes       Intervention Provide education and explanation of THRR including how the numbers were predicted and where they are located for reference       Expected Outcomes Short Term: Able to state/look up THRR;Short Term: Able to use daily as guideline for intensity in rehab;Long Term: Able to use THRR to govern intensity when exercising independently       Understanding  of Exercise Prescription Yes       Intervention Provide education, explanation, and written materials on patient's individual exercise prescription       Expected Outcomes Short Term: Able to explain program exercise prescription;Long Term: Able to explain home exercise prescription to exercise independently                Exercise Goals Re-Evaluation :   Discharge Exercise Prescription (Final Exercise Prescription Changes):   Nutrition:  Target Goals: Understanding of nutrition guidelines, daily intake of sodium 1500mg , cholesterol 200mg , calories 30% from fat and 7% or less from saturated fats, daily to have 5 or more servings of fruits and vegetables.  Biometrics:  Pre Biometrics - 11/16/22 0837       Pre Biometrics   Waist Circumference 34 inches    Hip Circumference 37 inches    Waist to Hip Ratio 0.92 %    Triceps Skinfold 36 mm    % Body Fat 37.6 %    Grip Strength 22 kg    Flexibility 0 in   not done due to low back pain   Single Leg Stand 11.06 seconds              Nutrition Therapy Plan and Nutrition Goals:   Nutrition Assessments:  MEDIFICTS Score Key: ?70 Need to make dietary changes  40-70 Heart Healthy Diet ? 40 Therapeutic Level Cholesterol Diet    Picture Your Plate Scores: <40 Unhealthy dietary pattern with much room for improvement. 41-50 Dietary pattern unlikely to meet recommendations for good health and room for improvement. 51-60 More healthful dietary pattern, with some room for improvement.  >60 Healthy dietary pattern, although there may be some specific behaviors that could be improved.    Nutrition Goals Re-Evaluation:   Nutrition Goals Re-Evaluation:   Nutrition Goals Discharge (Final Nutrition Goals Re-Evaluation):   Psychosocial: Target Goals: Acknowledge presence or absence of significant depression and/or stress, maximize coping skills, provide positive support system. Participant is able to verbalize types and  ability to use techniques and skills needed for reducing stress and depression.  Initial Review & Psychosocial Screening:  Initial Psych Review & Screening - 11/16/22 1030       Initial Review   Current issues with Current Depression;Current Anxiety/Panic;Current Sleep Concerns;Current Stress Concerns    Source of Stress Concerns Chronic Illness;Financial    Comments Hazyl shared that she has low levels of depression and anxiety currently. She has a long history of depression and is connected with several resources through behavioral health and social work. She denies any further need for resources at this time and is trying to  get reconnected with counseling through behavioral health.      Family Dynamics   Good Support System? Yes   Baraah has her children for support     Barriers   Psychosocial barriers to participate in program The patient should benefit from training in stress management and relaxation.      Screening Interventions   Interventions Encouraged to exercise;Provide feedback about the scores to participant;To provide support and resources with identified psychosocial needs    Expected Outcomes Short Term goal: Utilizing psychosocial counselor, staff and physician to assist with identification of specific Stressors or current issues interfering with healing process. Setting desired goal for each stressor or current issue identified.;Long Term Goal: Stressors or current issues are controlled or eliminated.;Short Term goal: Identification and review with participant of any Quality of Life or Depression concerns found by scoring the questionnaire.;Long Term goal: The participant improves quality of Life and PHQ9 Scores as seen by post scores and/or verbalization of changes             Quality of Life Scores:  Quality of Life - 11/16/22 1117       Quality of Life   Select Quality of Life      Quality of Life Scores   Health/Function Pre 11.2 %    Socioeconomic Pre 7.5  %    Psych/Spiritual Pre 24 %    Family Pre 22.8 %    GLOBAL Pre 14.57 %            Scores of 19 and below usually indicate a poorer quality of life in these areas.  A difference of  2-3 points is a clinically meaningful difference.  A difference of 2-3 points in the total score of the Quality of Life Index has been associated with significant improvement in overall quality of life, self-image, physical symptoms, and general health in studies assessing change in quality of life.  PHQ-9: Review Flowsheet  More data exists      11/16/2022 10/11/2022 08/17/2022 05/18/2022 01/05/2022  Depression screen PHQ 2/9  Decreased Interest 2 1 2 1 2   Down, Depressed, Hopeless 2 2 2 1 2   PHQ - 2 Score 4 3 4 2 4   Altered sleeping 2 1 2 1 2   Tired, decreased energy 2 1 2 1 2   Change in appetite 2 1 2 1 2   Feeling bad or failure about yourself  2 1 2 1 2   Trouble concentrating 2 2 2 1 2   Moving slowly or fidgety/restless 2 3 2 1 2   Suicidal thoughts 1 0 2 1 2   PHQ-9 Score 17 12 18 9 18   Difficult doing work/chores Not difficult at all - - - -    Details           Interpretation of Total Score  Total Score Depression Severity:  1-4 = Minimal depression, 5-9 = Mild depression, 10-14 = Moderate depression, 15-19 = Moderately severe depression, 20-27 = Severe depression   Psychosocial Evaluation and Intervention:   Psychosocial Re-Evaluation:   Psychosocial Discharge (Final Psychosocial Re-Evaluation):   Vocational Rehabilitation: Provide vocational rehab assistance to qualifying candidates.   Vocational Rehab Evaluation & Intervention:  Vocational Rehab - 11/16/22 1610       Initial Vocational Rehab Evaluation & Intervention   Assessment shows need for Vocational Rehabilitation No   Korrina is on disability            Education: Education Goals: Education classes will be provided on a weekly basis, covering  required topics. Participant will state understanding/return demonstration  of topics presented.     Core Videos: Exercise    Move It!  Clinical staff conducted group or individual video education with verbal and written material and guidebook.  Patient learns the recommended Pritikin exercise program. Exercise with the goal of living a long, healthy life. Some of the health benefits of exercise include controlled diabetes, healthier blood pressure levels, improved cholesterol levels, improved heart and lung capacity, improved sleep, and better body composition. Everyone should speak with their doctor before starting or changing an exercise routine.  Biomechanical Limitations Clinical staff conducted group or individual video education with verbal and written material and guidebook.  Patient learns how biomechanical limitations can impact exercise and how we can mitigate and possibly overcome limitations to have an impactful and balanced exercise routine.  Body Composition Clinical staff conducted group or individual video education with verbal and written material and guidebook.  Patient learns that body composition (ratio of muscle mass to fat mass) is a key component to assessing overall fitness, rather than body weight alone. Increased fat mass, especially visceral belly fat, can put [place] at increased risk for metabolic syndrome, type 2 diabetes, heart disease, and even death. It is recommended to combine diet and exercise (cardiovascular and resistance training) to improve your body composition. Seek guidance from your physician and exercise physiologist before implementing an exercise routine.  Exercise Action Plan Clinical staff conducted group or individual video education with verbal and written material and guidebook.  Patient learns the recommended strategies to achieve and enjoy long-term exercise adherence, including variety, self-motivation, self-efficacy, and positive decision making. Benefits of exercise include fitness, good health, weight management, more  energy, better sleep, less stress, and overall well-being.  Medical   Heart Disease Risk Reduction Clinical staff conducted group or individual video education with verbal and written material and guidebook.  Patient learns our heart is our most vital organ as it circulates oxygen, nutrients, white blood cells, and hormones throughout the entire body, and carries waste away. Data supports a plant-based eating plan like the Pritikin Program for its effectiveness in slowing progression of and reversing heart disease. The video provides a number of recommendations to address heart disease.   Metabolic Syndrome and Belly Fat  Clinical staff conducted group or individual video education with verbal and written material and guidebook.  Patient learns what metabolic syndrome is, how it leads to heart disease, and how one can reverse it and keep it from coming back. You have metabolic syndrome if you have 3 of the following 5 criteria: abdominal obesity, high blood pressure, high triglycerides, low HDL cholesterol, and high blood sugar.  Hypertension and Heart Disease Clinical staff conducted group or individual video education with verbal and written material and guidebook.  Patient learns that high blood pressure, or hypertension, is very common in the [place]. Hypertension is largely due to excessive salt intake, but other important risk factors include being overweight, physical inactivity, drinking too much alcohol, smoking, and not eating enough potassium from fruits and vegetables. High blood pressure is a leading risk factor for heart attack, stroke, congestive heart failure, dementia, kidney failure, and premature death. Long-term effects of excessive salt intake include stiffening of the arteries and thickening of heart muscle and organ damage. Recommendations include ways to reduce hypertension and the risk of heart disease.  Diseases of Our Time - Focusing on Diabetes Clinical staff  conducted group or individual video education with verbal and written material  and guidebook.  Patient learns why the best way to stop diseases of our time is prevention, through food and other lifestyle changes. Medicine (such as prescription pills and surgeries) is often only a Band-Aid on the problem, not a long-term solution. Most common diseases of our time include obesity, type 2 diabetes, hypertension, heart disease, and cancer. The Pritikin Program is recommended and has been proven to help reduce, reverse, and/or prevent the damaging effects of metabolic syndrome.  Nutrition   Overview of the Pritikin Eating Plan  Clinical staff conducted group or individual video education with verbal and written material and guidebook.  Patient learns about the Pritikin Eating Plan for disease risk reduction. The Pritikin Eating Plan emphasizes a wide variety of unrefined, minimally-processed carbohydrates, like fruits, vegetables, whole grains, and legumes. Go, Caution, and Stop food choices are explained. Plant-based and lean animal proteins are emphasized. Rationale provided for low sodium intake for blood pressure control, low added sugars for blood sugar stabilization, and low added fats and oils for coronary artery disease risk reduction and weight management.  Calorie Density  Clinical staff conducted group or individual video education with verbal and written material and guidebook.  Patient learns about calorie density and how it impacts the Pritikin Eating Plan. Knowing the characteristics of the food you choose will help you decide whether those foods will lead to weight gain or weight loss, and whether you want to consume more or less of them. Weight loss is usually a side effect of the Pritikin Eating Plan because of its focus on low calorie-dense foods.  Label Reading  Clinical staff conducted group or individual video education with verbal and written material and guidebook.  Patient learns  about the Pritikin recommended label reading guidelines and corresponding recommendations regarding calorie density, added sugars, sodium content, and whole grains.  Dining Out - Part 1  Clinical staff conducted group or individual video education with verbal and written material and guidebook.  Patient learns that restaurant meals can be sabotaging because they can be so high in calories, fat, sodium, and/or sugar. Patient learns recommended strategies on how to positively address this and avoid unhealthy pitfalls.  Facts on Fats  Clinical staff conducted group or individual video education with verbal and written material and guidebook.  Patient learns that lifestyle modifications can be just as effective, if not more so, as many medications for lowering your risk of heart disease. A Pritikin lifestyle can help to reduce your risk of inflammation and atherosclerosis (cholesterol build-up, or plaque, in the artery walls). Lifestyle interventions such as dietary choices and physical activity address the cause of atherosclerosis. A review of the types of fats and their impact on blood cholesterol levels, along with dietary recommendations to reduce fat intake is also included.  Nutrition Action Plan  Clinical staff conducted group or individual video education with verbal and written material and guidebook.  Patient learns how to incorporate Pritikin recommendations into their lifestyle. Recommendations include planning and keeping personal health goals in mind as an important part of their success.  Healthy Mind-Set    Healthy Minds, Bodies, Hearts  Clinical staff conducted group or individual video education with verbal and written material and guidebook.  Patient learns how to identify when they are stressed. Video will discuss the impact of that stress, as well as the many benefits of stress management. Patient will also be introduced to stress management techniques. The way we think, act, and  feel has an impact on our hearts.  How Our Thoughts Can Heal Our Hearts  Clinical staff conducted group or individual video education with verbal and written material and guidebook.  Patient learns that negative thoughts can cause depression and anxiety. This can result in negative lifestyle behavior and serious health problems. Cognitive behavioral therapy is an effective method to help control our thoughts in order to change and improve our emotional outlook.  Additional Videos:  Exercise    Improving Performance  Clinical staff conducted group or individual video education with verbal and written material and guidebook.  Patient learns to use a non-linear approach by alternating intensity levels and lengths of time spent exercising to help burn more calories and lose more body fat. Cardiovascular exercise helps improve heart health, metabolism, hormonal balance, blood sugar control, and recovery from fatigue. Resistance training improves strength, endurance, balance, coordination, reaction time, metabolism, and muscle mass. Flexibility exercise improves circulation, posture, and balance. Seek guidance from your physician and exercise physiologist before implementing an exercise routine and learn your capabilities and proper form for all exercise.  Introduction to Yoga  Clinical staff conducted group or individual video education with verbal and written material and guidebook.  Patient learns about yoga, a discipline of the coming together of mind, breath, and body. The benefits of yoga include improved flexibility, improved range of motion, better posture and core strength, increased lung function, weight loss, and positive self-image. Yoga's heart health benefits include lowered blood pressure, healthier heart rate, decreased cholesterol and triglyceride levels, improved immune function, and reduced stress. Seek guidance from your physician and exercise physiologist before implementing an exercise  routine and learn your capabilities and proper form for all exercise.  Medical   Aging: Enhancing Your Quality of Life  Clinical staff conducted group or individual video education with verbal and written material and guidebook.  Patient learns key strategies and recommendations to stay in good physical health and enhance quality of life, such as prevention strategies, having an advocate, securing a Health Care Proxy and Power of Attorney, and keeping a list of medications and system for tracking them. It also discusses how to avoid risk for bone loss.  Biology of Weight Control  Clinical staff conducted group or individual video education with verbal and written material and guidebook.  Patient learns that weight gain occurs because we consume more calories than we burn (eating more, moving less). Even if your body weight is normal, you may have higher ratios of fat compared to muscle mass. Too much body fat puts you at increased risk for cardiovascular disease, heart attack, stroke, type 2 diabetes, and obesity-related cancers. In addition to exercise, following the Pritikin Eating Plan can help reduce your risk.  Decoding Lab Results  Clinical staff conducted group or individual video education with verbal and written material and guidebook.  Patient learns that lab test reflects one measurement whose values change over time and are influenced by many factors, including medication, stress, sleep, exercise, food, hydration, pre-existing medical conditions, and more. It is recommended to use the knowledge from this video to become more involved with your lab results and evaluate your numbers to speak with your doctor.   Diseases of Our Time - Overview  Clinical staff conducted group or individual video education with verbal and written material and guidebook.  Patient learns that according to the CDC, 50% to 70% of chronic diseases (such as obesity, type 2 diabetes, elevated lipids, hypertension,  and heart disease) are avoidable through lifestyle improvements including healthier food choices, listening to  satiety cues, and increased physical activity.  Sleep Disorders Clinical staff conducted group or individual video education with verbal and written material and guidebook.  Patient learns how good quality and duration of sleep are important to overall health and well-being. Patient also learns about sleep disorders and how they impact health along with recommendations to address them, including discussing with a physician.  Nutrition  Dining Out - Part 2 Clinical staff conducted group or individual video education with verbal and written material and guidebook.  Patient learns how to plan ahead and communicate in order to maximize their dining experience in a healthy and nutritious manner. Included are recommended food choices based on the type of restaurant the patient is visiting.   Fueling a Banker conducted group or individual video education with verbal and written material and guidebook.  There is a strong connection between our food choices and our health. Diseases like obesity and type 2 diabetes are very prevalent and are in large-part due to lifestyle choices. The Pritikin Eating Plan provides plenty of food and hunger-curbing satisfaction. It is easy to follow, affordable, and helps reduce health risks.  Menu Workshop  Clinical staff conducted group or individual video education with verbal and written material and guidebook.  Patient learns that restaurant meals can sabotage health goals because they are often packed with calories, fat, sodium, and sugar. Recommendations include strategies to plan ahead and to communicate with the manager, chef, or server to help order a healthier meal.  Planning Your Eating Strategy  Clinical staff conducted group or individual video education with verbal and written material and guidebook.  Patient learns about the  Pritikin Eating Plan and its benefit of reducing the risk of disease. The Pritikin Eating Plan does not focus on calories. Instead, it emphasizes high-quality, nutrient-rich foods. By knowing the characteristics of the foods, we choose, we can determine their calorie density and make informed decisions.  Targeting Your Nutrition Priorities  Clinical staff conducted group or individual video education with verbal and written material and guidebook.  Patient learns that lifestyle habits have a tremendous impact on disease risk and progression. This video provides eating and physical activity recommendations based on your personal health goals, such as reducing LDL cholesterol, losing weight, preventing or controlling type 2 diabetes, and reducing high blood pressure.  Vitamins and Minerals  Clinical staff conducted group or individual video education with verbal and written material and guidebook.  Patient learns different ways to obtain key vitamins and minerals, including through a recommended healthy diet. It is important to discuss all supplements you take with your doctor.   Healthy Mind-Set    Smoking Cessation  Clinical staff conducted group or individual video education with verbal and written material and guidebook.  Patient learns that cigarette smoking and tobacco addiction pose a serious health risk which affects millions of people. Stopping smoking will significantly reduce the risk of heart disease, lung disease, and many forms of cancer. Recommended strategies for quitting are covered, including working with your doctor to develop a successful plan.  Culinary   Becoming a Set designer conducted group or individual video education with verbal and written material and guidebook.  Patient learns that cooking at home can be healthy, cost-effective, quick, and puts them in control. Keys to cooking healthy recipes will include looking at your recipe, assessing your  equipment needs, planning ahead, making it simple, choosing cost-effective seasonal ingredients, and limiting the use of added fats, salts,  and sugars.  Cooking - Breakfast and Snacks  Clinical staff conducted group or individual video education with verbal and written material and guidebook.  Patient learns how important breakfast is to satiety and nutrition through the entire day. Recommendations include key foods to eat during breakfast to help stabilize blood sugar levels and to prevent overeating at meals later in the day. Planning ahead is also a key component.  Cooking - Educational psychologist conducted group or individual video education with verbal and written material and guidebook.  Patient learns eating strategies to improve overall health, including an approach to cook more at home. Recommendations include thinking of animal protein as a side on your plate rather than center stage and focusing instead on lower calorie dense options like vegetables, fruits, whole grains, and plant-based proteins, such as beans. Making sauces in large quantities to freeze for later and leaving the skin on your vegetables are also recommended to maximize your experience.  Cooking - Healthy Salads and Dressing Clinical staff conducted group or individual video education with verbal and written material and guidebook.  Patient learns that vegetables, fruits, whole grains, and legumes are the foundations of the Pritikin Eating Plan. Recommendations include how to incorporate each of these in flavorful and healthy salads, and how to create homemade salad dressings. Proper handling of ingredients is also covered. Cooking - Soups and [employer] - Soups and Desserts Clinical staff conducted group or individual video education with verbal and written material and guidebook.  Patient learns that Pritikin soups and desserts make for easy, nutritious, and delicious snacks and meal components that are  low in sodium, fat, sugar, and calorie density, while high in vitamins, minerals, and filling fiber. Recommendations include simple and healthy ideas for soups and desserts.   Overview     The Pritikin Solution Program Overview Clinical staff conducted group or individual video education with verbal and written material and guidebook.  Patient learns that the results of the Pritikin Program have been documented in more than 100 articles published in peer-reviewed journals, and the benefits include reducing risk factors for (and, in some cases, even reversing) high cholesterol, high blood pressure, type 2 diabetes, obesity, and more! An overview of the three key pillars of the Pritikin Program will be covered: eating well, doing regular exercise, and having a healthy mind-set.  WORKSHOPS  Exercise: Exercise Basics: Building Your Action Plan Clinical staff led group instruction and group discussion with PowerPoint presentation and patient guidebook. To enhance the learning environment the use of posters, models and videos may be added. At the conclusion of this workshop, patients will comprehend the difference between physical activity and exercise, as well as the benefits of incorporating both, into their routine. Patients will understand the FITT (Frequency, Intensity, Time, and Type) principle and how to use it to build an exercise action plan. In addition, safety concerns and other considerations for exercise and cardiac rehab will be addressed by the presenter. The purpose of this lesson is to promote a comprehensive and effective weekly exercise routine in order to improve patients' overall level of fitness.   Managing Heart Disease: Your Path to a Healthier Heart Clinical staff led group instruction and group discussion with PowerPoint presentation and patient guidebook. To enhance the learning environment the use of posters, models and videos may be added.At the conclusion of this workshop,  patients will understand the anatomy and physiology of the heart. Additionally, they will understand how Pritikin's three pillars impact  the risk factors, the progression, and the management of heart disease.  The purpose of this lesson is to provide a high-level overview of the heart, heart disease, and how the Pritikin lifestyle positively impacts risk factors.  Exercise Biomechanics Clinical staff led group instruction and group discussion with PowerPoint presentation and patient guidebook. To enhance the learning environment the use of posters, models and videos may be added. Patients will learn how the structural parts of their bodies function and how these functions impact their daily activities, movement, and exercise. Patients will learn how to promote a neutral spine, learn how to manage pain, and identify ways to improve their physical movement in order to promote healthy living. The purpose of this lesson is to expose patients to common physical limitations that impact physical activity. Participants will learn practical ways to adapt and manage aches and pains, and to minimize their effect on regular exercise. Patients will learn how to maintain good posture while sitting, walking, and lifting.  Balance Training and Fall Prevention  Clinical staff led group instruction and group discussion with PowerPoint presentation and patient guidebook. To enhance the learning environment the use of posters, models and videos may be added. At the conclusion of this workshop, patients will understand the importance of their sensorimotor skills (vision, proprioception, and the vestibular system) in maintaining their ability to balance as they age. Patients will apply a variety of balancing exercises that are appropriate for their current level of function. Patients will understand the common causes for poor balance, possible solutions to these problems, and ways to modify their physical environment  in order to minimize their fall risk. The purpose of this lesson is to teach patients about the importance of maintaining balance as they age and ways to minimize their risk of falling.  WORKSHOPS   Nutrition:  Fueling a Ship broker led group instruction and group discussion with PowerPoint presentation and patient guidebook. To enhance the learning environment the use of posters, models and videos may be added. Patients will review the foundational principles of the Pritikin Eating Plan and understand what constitutes a serving size in each of the food groups. Patients will also learn Pritikin-friendly foods that are better choices when away from home and review make-ahead meal and snack options. Calorie density will be reviewed and applied to three nutrition priorities: weight maintenance, weight loss, and weight gain. The purpose of this lesson is to reinforce (in a group setting) the key concepts around what patients are recommended to eat and how to apply these guidelines when away from home by planning and selecting Pritikin-friendly options. Patients will understand how calorie density may be adjusted for different weight management goals.  Mindful Eating  Clinical staff led group instruction and group discussion with PowerPoint presentation and patient guidebook. To enhance the learning environment the use of posters, models and videos may be added. Patients will briefly review the concepts of the Pritikin Eating Plan and the importance of low-calorie dense foods. The concept of mindful eating will be introduced as well as the importance of paying attention to internal hunger signals. Triggers for non-hunger eating and techniques for dealing with triggers will be explored. The purpose of this lesson is to provide patients with the opportunity to review the basic principles of the Pritikin Eating Plan, discuss the value of eating mindfully and how to measure internal cues of hunger  and fullness using the Hunger Scale. Patients will also discuss reasons for non-hunger eating and learn strategies to  use for controlling emotional eating.  Targeting Your Nutrition Priorities Clinical staff led group instruction and group discussion with PowerPoint presentation and patient guidebook. To enhance the learning environment the use of posters, models and videos may be added. Patients will learn how to determine their genetic susceptibility to disease by reviewing their family history. Patients will gain insight into the importance of diet as part of an overall healthy lifestyle in mitigating the impact of genetics and other environmental insults. The purpose of this lesson is to provide patients with the opportunity to assess their personal nutrition priorities by looking at their family history, their own health history and current risk factors. Patients will also be able to discuss ways of prioritizing and modifying the Pritikin Eating Plan for their highest risk areas  Menu  Clinical staff led group instruction and group discussion with PowerPoint presentation and patient guidebook. To enhance the learning environment the use of posters, models and videos may be added. Using menus brought in from [employer], or printed from [employer], patients will apply the Pritikin dining out guidelines that were presented in the [employer] video. Patients will also be able to practice these guidelines in a variety of provided scenarios. The purpose of this lesson is to provide patients with the opportunity to practice hands-on learning of the Pritikin Dining Out guidelines with actual menus and practice scenarios.  Label Reading Clinical staff led group instruction and group discussion with PowerPoint presentation and patient guidebook. To enhance the learning environment the use of posters, models and videos may be added. Patients will review and discuss the Pritikin label  reading guidelines presented in Pritikin's Label Reading Educational series video. Using fool labels brought in from local grocery stores and markets, patients will apply the label reading guidelines and determine if the packaged food meet the Pritikin guidelines. The purpose of this lesson is to provide patients with the opportunity to review, discuss, and practice hands-on learning of the Pritikin Label Reading guidelines with actual packaged food labels. Cooking School  Pritikin's [employer] are designed to teach patients ways to prepare quick, simple, and affordable recipes at home. The importance of nutrition's role in chronic disease risk reduction is reflected in its emphasis in the overall Pritikin program. By learning how to prepare essential core Pritikin Eating Plan recipes, patients will increase control over what they eat; be able to customize the flavor of foods without the use of added salt, sugar, or fat; and improve the quality of the food they consume. By learning a set of core recipes which are easily assembled, quickly prepared, and affordable, patients are more likely to prepare more healthy foods at home. These workshops focus on convenient breakfasts, simple entres, side dishes, and desserts which can be prepared with minimal effort and are consistent with nutrition recommendations for cardiovascular risk reduction. Cooking [employer] are taught by a Armed forces logistics/support/administrative officer (RD) who has been trained by the [employer]. The chef or RD has a clear understanding of the importance of minimizing - if not completely eliminating - added fat, sugar, and sodium in recipes. Throughout the series of Cooking School Workshop sessions, patients will learn about healthy ingredients and efficient methods of cooking to build confidence in their capability to prepare    Cooking School weekly topics:  Adding Flavor- Sodium-Free  Fast and Healthy  Breakfasts  Powerhouse Plant-Based Proteins  Satisfying Salads and Dressings  Simple Sides and Sauces  International Cuisine-Spotlight on  the Blue Zones  Delicious Desserts  Savory Soups  [employer] - Meals in a Snap  Tasty Appetizers and Snacks  Comforting Weekend Breakfasts  One-Pot Wonders   Fast [employer] Your Pritikin Plate  WORKSHOPS   Healthy Mindset (Psychosocial):  Focused Goals, Sustainable Changes Clinical staff led group instruction and group discussion with PowerPoint presentation and patient guidebook. To enhance the learning environment the use of posters, models and videos may be added. Patients will be able to apply effective goal setting strategies to establish at least one personal goal, and then take consistent, meaningful action toward that goal. They will learn to identify common barriers to achieving personal goals and develop strategies to overcome them. Patients will also gain an understanding of how our mind-set can impact our ability to achieve goals and the importance of cultivating a positive and growth-oriented mind-set. The purpose of this lesson is to provide patients with a deeper understanding of how to set and achieve personal goals, as well as the tools and strategies needed to overcome common obstacles which may arise along the way.  From Head to Heart: The Power of a Healthy Outlook  Clinical staff led group instruction and group discussion with PowerPoint presentation and patient guidebook. To enhance the learning environment the use of posters, models and videos may be added. Patients will be able to recognize and describe the impact of emotions and mood on physical health. They will discover the importance of self-care and explore self-care practices which may work for them. Patients will also learn how to utilize the 4 C's to cultivate a healthier outlook and better manage stress and challenges. The  purpose of this lesson is to demonstrate to patients how a healthy outlook is an essential part of maintaining good health, especially as they continue their cardiac rehab journey.  Healthy Sleep for a Healthy Heart Clinical staff led group instruction and group discussion with PowerPoint presentation and patient guidebook. To enhance the learning environment the use of posters, models and videos may be added. At the conclusion of this workshop, patients will be able to demonstrate knowledge of the importance of sleep to overall health, well-being, and quality of life. They will understand the symptoms of, and treatments for, common sleep disorders. Patients will also be able to identify daytime and nighttime behaviors which impact sleep, and they will be able to apply these tools to help manage sleep-related challenges. The purpose of this lesson is to provide patients with a general overview of sleep and outline the importance of quality sleep. Patients will learn about a few of the most common sleep disorders. Patients will also be introduced to the concept of "sleep hygiene," and discover ways to self-manage certain sleeping problems through simple daily behavior changes. Finally, the workshop will motivate patients by clarifying the links between quality sleep and their goals of heart-healthy living.   Recognizing and Reducing Stress Clinical staff led group instruction and group discussion with PowerPoint presentation and patient guidebook. To enhance the learning environment the use of posters, models and videos may be added. At the conclusion of this workshop, patients will be able to understand the types of stress reactions, differentiate between acute and chronic stress, and recognize the impact that chronic stress has on their health. They will also be able to apply different coping mechanisms, such as reframing negative self-talk. Patients will have the opportunity to practice a variety of stress  management techniques, such as deep abdominal breathing,  progressive muscle relaxation, and/or guided imagery.  The purpose of this lesson is to educate patients on the role of stress in their lives and to provide healthy techniques for coping with it.  Learning Barriers/Preferences:  Learning Barriers/Preferences - 11/16/22 7829       Learning Barriers/Preferences   Learning Barriers Sight   reading glasses   Learning Preferences Audio;Computer/Internet;Group Instruction;Individual Instruction;Pictoral;Skilled Demonstration;Verbal Instruction;Written Material;Video             Education Topics:  Knowledge Questionnaire Score:  Knowledge Questionnaire Score - 11/16/22 0923       Knowledge Questionnaire Score   Pre Score 21/28             Core Components/Risk Factors/Patient Goals at Admission:  Personal Goals and Risk Factors at Admission - 11/16/22 0802       Core Components/Risk Factors/Patient Goals on Admission    Weight Management Yes    Intervention Weight Management: Provide education and appropriate resources to help participant work on and attain dietary goals.;Weight Management: Develop a combined nutrition and exercise program designed to reach desired caloric intake, while maintaining appropriate intake of nutrient and fiber, sodium and fats, and appropriate energy expenditure required for the weight goal.    Expected Outcomes Short Term: Continue to assess and modify interventions until short term weight is achieved;Long Term: Adherence to nutrition and physical activity/exercise program aimed toward attainment of established weight goal;Understanding recommendations for meals to include 15-35% energy as protein, 25-35% energy from fat, 35-60% energy from carbohydrates, less than 200mg  of dietary cholesterol, 20-35 gm of total fiber daily;Understanding of distribution of calorie intake throughout the day with the consumption of 4-5 meals/snacks    Tobacco Cessation  Yes    Number of packs per day 1-2    Intervention Assist the participant in steps to quit. Provide individualized education and counseling about committing to Tobacco Cessation, relapse prevention, and pharmacological support that can be provided by physician.;Education officer, environmental, assist with locating and accessing local/national Quit Smoking programs, and support quit date choice.    Expected Outcomes Short Term: Will demonstrate readiness to quit, by selecting a quit date.;Long Term: Complete abstinence from all tobacco products for at least 12 months from quit date.;Short Term: Will quit all tobacco product use, adhering to prevention of relapse plan.    Diabetes Yes    Intervention Provide education about proper nutrition, including hydration, and aerobic/resistive exercise prescription along with prescribed medications to achieve blood glucose in normal ranges: Fasting glucose 65-99 mg/dL;Provide education about signs/symptoms and action to take for hypo/hyperglycemia.    Expected Outcomes Short Term: Participant verbalizes understanding of the signs/symptoms and immediate care of hyper/hypoglycemia, proper foot care and importance of medication, aerobic/resistive exercise and nutrition plan for blood glucose control.;Long Term: Attainment of HbA1C < 7%.    Heart Failure Yes    Intervention Provide a combined exercise and nutrition program that is supplemented with education, support and counseling about heart failure. Directed toward relieving symptoms such as shortness of breath, decreased exercise tolerance, and extremity edema.    Expected Outcomes Improve functional capacity of life;Short term: Attendance in program 2-3 days a week with increased exercise capacity. Reported lower sodium intake. Reported increased fruit and vegetable intake. Reports medication compliance.;Short term: Daily weights obtained and reported for increase. Utilizing diuretic protocols set by physician.;Long  term: Adoption of self-care skills and reduction of barriers for early signs and symptoms recognition and intervention leading to self-care maintenance.    Hypertension Yes  Intervention Provide education on lifestyle modifcations including regular physical activity/exercise, weight management, moderate sodium restriction and increased consumption of fresh fruit, vegetables, and low fat dairy, alcohol moderation, and smoking cessation.;Monitor prescription use compliance.    Expected Outcomes Short Term: Continued assessment and intervention until BP is < 140/41mm HG in hypertensive participants. < 130/56mm HG in hypertensive participants with diabetes, heart failure or chronic kidney disease.;Long Term: Maintenance of blood pressure at goal levels.    Lipids Yes    Intervention Provide education and support for participant on nutrition & aerobic/resistive exercise along with prescribed medications to achieve LDL 70mg , HDL >40mg .    Expected Outcomes Short Term: Participant states understanding of desired cholesterol values and is compliant with medications prescribed. Participant is following exercise prescription and nutrition guidelines.;Long Term: Cholesterol controlled with medications as prescribed, with individualized exercise RX and with personalized nutrition plan. Value goals: LDL < 70mg , HDL > 40 mg.    Stress Yes    Intervention Offer individual and/or small group education and counseling on adjustment to heart disease, stress management and health-related lifestyle change. Teach and support self-help strategies.;Refer participants experiencing significant psychosocial distress to appropriate mental health specialists for further evaluation and treatment. When possible, include family members and significant others in education/counseling sessions.    Expected Outcomes Short Term: Participant demonstrates changes in health-related behavior, relaxation and other stress management skills,  ability to obtain effective social support, and compliance with psychotropic medications if prescribed.;Long Term: Emotional wellbeing is indicated by absence of clinically significant psychosocial distress or social isolation.             Core Components/Risk Factors/Patient Goals Review:    Core Components/Risk Factors/Patient Goals at Discharge (Final Review):    ITP Comments:  ITP Comments     Row Name 11/16/22 0801           ITP Comments Dr. Armanda Magic medical director. Introduction to [facility] education/intensive cardiac rehab. Initial orientation packet reviewed with patient.                Comments: Participant attended orientation for the cardiac rehabilitation program on  11/16/2022  to perform initial intake and exercise walk test. Patient introduced to the [facility] education and orientation packet was reviewed. Completed 6-minute walk test, measurements, initial ITP, and exercise prescription. Vital signs stable. Telemetry-normal sinus rhythm LBBB, asymptomatic.  Service time was from [id number] to [phone].  Jonna Coup, MS, ACSM-CEP 11/16/2022 1:52 PM

## 2022-11-16 NOTE — Progress Notes (Signed)
Cardiac Rehab Medication Review   Does the patient  feel that his/her medications are working for him/her?  YES   Has the patient been experiencing any side effects to the medications prescribed? NO  Does the patient measure his/her own blood pressure or blood glucose at home?  YES    Does the patient have any problems obtaining medications due to transportation or finances?   NO  Understanding of regimen: good Understanding of indications: good Potential of compliance: good    Comments: Kendra Griffith understands her medications and just got a new BP cuff at home. She does not check her CBGs at home.     Jonna Coup, MS, ACSM-CEP 11/16/2022 8:42 AM

## 2022-11-18 ENCOUNTER — Ambulatory Visit: Payer: Medicaid Other | Admitting: Nurse Practitioner

## 2022-11-18 ENCOUNTER — Ambulatory Visit (HOSPITAL_COMMUNITY): Payer: Medicaid Other

## 2022-11-21 ENCOUNTER — Ambulatory Visit
Admission: RE | Admit: 2022-11-21 | Discharge: 2022-11-21 | Disposition: A | Discharge status: Home / Self Care | Payer: Medicaid Other | Source: Ambulatory Visit | Attending: Nurse Practitioner | Admitting: Nurse Practitioner

## 2022-11-21 ENCOUNTER — Ambulatory Visit (HOSPITAL_COMMUNITY): Payer: Medicaid Other

## 2022-11-21 DIAGNOSIS — Z1231 Encounter for screening mammogram for malignant neoplasm of breast: Secondary | ICD-10-CM

## 2022-11-23 ENCOUNTER — Encounter (HOSPITAL_COMMUNITY): Admission: RE | Admit: 2022-11-23 | Payer: Medicaid Other | Source: Ambulatory Visit

## 2022-11-23 ENCOUNTER — Ambulatory Visit (HOSPITAL_COMMUNITY): Payer: Medicaid Other

## 2022-11-23 DIAGNOSIS — I5022 Chronic systolic (congestive) heart failure: Secondary | ICD-10-CM | POA: Diagnosis not present

## 2022-11-23 LAB — GLUCOSE, CAPILLARY
Glucose-Capillary: 108 mg/dL — ABNORMAL HIGH (ref 70–99)
Glucose-Capillary: 112 mg/dL — ABNORMAL HIGH (ref 70–99)

## 2022-11-23 NOTE — Progress Notes (Signed)
Daily Session Note  Patient Details  Name: LANANH MILLAR MRN: [id number] Date of Birth: 1965-11-16 Referring Provider:   Flowsheet Row INTENSIVE CARDIAC REHAB ORIENT from 11/16/2022 in [facility]  Referring Provider Marca Ancona, MD       Encounter Date: 11/23/2022  Check In:  Session Check In - 11/23/22 1047       Check-In   Supervising physician immediately available to respond to emergencies [facility] - Physician supervision    Physician(s) Jari Favre, PA    Location [facility]    Staff Present Valinda Party, MS, Exercise Physiologist;David Manus Gunning, MS, ACSM-CEP, CCRP, Exercise Physiologist;Olinty Peggye Pitt, MS, ACSM-CEP, Exercise Physiologist;Jetta Dan Humphreys BS, ACSM-CEP, Exercise Physiologist;Casey Synthia Innocent, RN, BSN    Virtual Visit No    Medication changes reported     No    Fall or balance concerns reported    No    Tobacco Cessation No Change    Warm-up and Cool-down Performed as group-led instruction    Resistance Training Performed No    VAD Patient? No    PAD/SET Patient? No      Pain Assessment   Currently in Pain? No/denies    Pain Score 0-No pain    Multiple Pain Sites No             Capillary Blood Glucose: Results for orders placed or performed during the hospital encounter of 11/23/22 (from the past 24 hour(s))  Glucose, capillary     Status: Abnormal   Collection Time: 11/23/22 10:30 AM  Result Value Ref Range   Glucose-Capillary 108 (H) 70 - 99 mg/dL  Glucose, capillary     Status: Abnormal   Collection Time: 11/23/22 11:40 AM  Result Value Ref Range   Glucose-Capillary 112 (H) 70 - 99 mg/dL      Social History   Tobacco Use  Smoking Status Every Day   Current packs/day: 2.00   Average packs/day: 2.0 packs/day for 29.0 years (58.0 ttl pk-yrs)   Types: Cigarettes  Smokeless Tobacco Never    Goals Met:  Exercise tolerated well No report of concerns or  symptoms today  Goals Unmet:  Not Applicable  Comments: Shaili started cardiac rehab today. Pt with VSS, telemetry NSR. Medication list reconciled. Pt denies barriers to medication compliance. PSYCHOSOCIAL ASSESSMENT: PHQ-2/PHQ-9 score 4/17. Pt exhibits some coping skills, supportive children. Anea states that she has been seeing [facility] for her chronic depression since her mother passes in 55. Pt states she's looking forward to CR to rebuild strength and stamina and to understand her limitations. Pt declined needs/referral to a mental health professional at this time. Pt enjoys music and dancing for stress relief. Dashawn's goals for cardiac rehab are to increase her endurance, get stronger, build her confidence, and to learn about healthy food choices. Pt oriented to exercise equipment and routine. Pt verbalized understanding. Pt stated that she does need help with transportation resources. SCAT form will be filled out with the patient on Friday when she returns to class. RN reached out to Child psychotherapist from the heart failure clinic who is known to her. Rosetta Posner, LCSW will f/u with patient.   Dr. Armanda Magic is Medical Director for Cardiac Rehab at [facility].

## 2022-11-25 ENCOUNTER — Ambulatory Visit (HOSPITAL_COMMUNITY): Payer: Medicaid Other

## 2022-11-25 ENCOUNTER — Telehealth (HOSPITAL_COMMUNITY): Payer: Self-pay | Admitting: Licensed Clinical Social Worker

## 2022-11-25 ENCOUNTER — Encounter (HOSPITAL_COMMUNITY): Payer: Medicaid Other

## 2022-11-25 NOTE — Telephone Encounter (Signed)
H&V Care Navigation CSW Progress Note  Clinical Social Worker consulted to speak with pt regarding transportation concerns.  Pt reports that she uses Medicaid transport to get to and from MD appts -has had some issues with drivers in the past but is working through that- will continue to use this service.  Main concern is getting to and from non medical appts.  She uses Medicaid to get to pharmacy when she can but can't get to grocery store reliably.  Son takes her when he can but is not always available due to his job.  Has utilized delivery groceries in the past but is no longer able to with [employer]- discussed trying another chain like [employer] when her son can't take her- she will try this. Cardiac Rehab assisting to apply to SCAT.  Unfortunately no further transport options available to assist this patient.  She does report another concern with sleeping arrangements.  States she had to move into two story townhome because it was all that was available and all that she could afford but that she struggles to get up the stairs due to chronic foot pain.  States she sleeps on the loveseat downstairs but it is very uncomfortable.  CSW will look into potential options to assist pt in finding better sleeping arrangements that fit her needs.  Patient is participating in a Managed Medicaid Plan:  Yes  SDOH Screenings   Food Insecurity: Food Insecurity Present (10/26/2022)  Housing: Low Risk  (09/02/2022)  Transportation Needs: Unmet Transportation Needs (09/02/2022)  Utilities: Not At Risk (08/31/2022)  Depression (PHQ2-9): High Risk (11/16/2022)  Financial Resource Strain: Medium Risk (09/02/2022)  Physical Activity: Insufficiently Active (09/02/2022)  Social Connections: Moderately Isolated (09/02/2022)  Stress: Stress Concern Present (09/02/2022)  Tobacco Use: High Risk (11/07/2022)   Burna Sis, LCSW Clinical Social Worker [facility] Desk#: [phone] Cell#:  [phone]

## 2022-11-28 ENCOUNTER — Encounter: Payer: Self-pay | Admitting: Nurse Practitioner

## 2022-11-28 ENCOUNTER — Encounter (HOSPITAL_COMMUNITY)
Admission: RE | Admit: 2022-11-28 | Discharge: 2022-11-28 | Disposition: A | Discharge status: Home / Self Care | Payer: Medicaid Other | Source: Ambulatory Visit | Attending: Cardiology | Admitting: Cardiology

## 2022-11-28 ENCOUNTER — Ambulatory Visit (HOSPITAL_COMMUNITY): Payer: Medicaid Other

## 2022-11-28 ENCOUNTER — Telehealth: Payer: Self-pay | Admitting: Nurse Practitioner

## 2022-11-28 DIAGNOSIS — I5022 Chronic systolic (congestive) heart failure: Secondary | ICD-10-CM

## 2022-11-28 LAB — GLUCOSE, CAPILLARY
Glucose-Capillary: 92 mg/dL (ref 70–99)
Glucose-Capillary: 87 mg/dL (ref 70–99)

## 2022-11-28 NOTE — Progress Notes (Addendum)
Kendra Griffith reports that she feels lightheaded on a daily basis. Denies feeling light headed today.Resting Systolic BP's in the upper 80's to 90's during exercise at cardiac rehab. Patient encouraged to drink more water today. Sitting blood pressure on nustep 89/57. Standing blood pressure 91/59. Exit BP WNL 107/78 via automatic cuff. Will send today's vital signs to [facility] for review. Medications reviewed. Kendra Griffith says she has been out of her aldactone for a few days and needs to get her medication refilled.Will continue to monitor the patient throughout  the program.Kendra Griffith Kendra Palmer RN BSN

## 2022-11-28 NOTE — Telephone Encounter (Signed)
Copied from CRM [id number]. Topic: General - Other >> Nov 28, 2022  2:59 PM Phill Myron wrote: Pt. [place] would like a letter sent to [facility], stating that patient has medical issues  (sob, wheezing) and has had them for 9 mos or greater.  Patient is trying to get a refund back, facility states she needs a doctors note/letter in order to get her money back

## 2022-11-28 NOTE — Telephone Encounter (Signed)
Called and LVM. Need more detailed information about what is needed for the letter and what is preventing her from participating

## 2022-11-30 ENCOUNTER — Encounter (HOSPITAL_COMMUNITY): Payer: Medicaid Other

## 2022-11-30 ENCOUNTER — Telehealth (HOSPITAL_COMMUNITY): Payer: Self-pay | Admitting: *Deleted

## 2022-11-30 ENCOUNTER — Ambulatory Visit (HOSPITAL_COMMUNITY): Payer: Medicaid Other

## 2022-11-30 NOTE — Telephone Encounter (Signed)
Kendra Griffith called to report that she is having pain in both of her big toes. Kendra Griffith has an appointment to see the podiatrist on 12/07/22. Will cancel appointments for Friday and Monday of next week.Thayer Headings RN BSN

## 2022-12-01 ENCOUNTER — Telehealth (HOSPITAL_COMMUNITY): Payer: Self-pay | Admitting: Cardiology

## 2022-12-01 ENCOUNTER — Encounter: Payer: Medicaid Other | Attending: Psychology

## 2022-12-01 DIAGNOSIS — G894 Chronic pain syndrome: Secondary | ICD-10-CM | POA: Insufficient documentation

## 2022-12-01 DIAGNOSIS — I5022 Chronic systolic (congestive) heart failure: Secondary | ICD-10-CM

## 2022-12-01 DIAGNOSIS — F3173 Bipolar disorder, in partial remission, most recent episode manic: Secondary | ICD-10-CM | POA: Insufficient documentation

## 2022-12-01 DIAGNOSIS — F418 Other specified anxiety disorders: Secondary | ICD-10-CM | POA: Insufficient documentation

## 2022-12-01 DIAGNOSIS — R413 Other amnesia: Secondary | ICD-10-CM | POA: Insufficient documentation

## 2022-12-01 DIAGNOSIS — G621 Alcoholic polyneuropathy: Secondary | ICD-10-CM | POA: Insufficient documentation

## 2022-12-01 MED ORDER — SPIRONOLACTONE 25 MG PO TABS
25.0000 mg | ORAL_TABLET | Freq: Every day | ORAL | 3 refills | Status: DC
Start: 2022-12-01 — End: 2023-02-14

## 2022-12-01 NOTE — Telephone Encounter (Signed)
-----   Message from Nurse London Sheer sent at 12/01/2022  3:21 PM EDT ----- Regarding: BP's from cardiac rehab Good afternoon Tahjae Durr would you mind contacting Ms Weinberg about there medication. She is not coming back to cardiac rehab in until after she see the podiatrist next week.  Thanks for your assistance in advance,  [facility]  Cardiac Rehab ----- Message ----- From: Allayne Butcher, PA-C Sent: 12/01/2022   3:19 PM EDT To: Cammy Copa, RN  Can try switching Spironolactone to bedtime ----- Message ----- From: Cammy Copa, RN Sent: 11/28/2022   5:03 PM EDT To: Brittainy Sherlynn Carbon, PA-C  Good afternoon Lenna Sciara told me today at cardiac rehab that she feels lightheaded almost everyday. Please see attached vitals from cardiac rehab. Would you mind reviewing her meds and see if you can adjust anything if able? Isamari says she has been out of her aldactone for at least 2 days and plans to have it refilled.  Thanks for your help in advance, Gladstone Lighter RN Cardiac Rehab [place] sees Dr Shirlee Latch in a few weeks and is scheduled to have a repeat echo.

## 2022-12-02 ENCOUNTER — Ambulatory Visit (HOSPITAL_COMMUNITY): Payer: Medicaid Other

## 2022-12-02 ENCOUNTER — Encounter (HOSPITAL_COMMUNITY): Payer: Medicaid Other

## 2022-12-05 ENCOUNTER — Other Ambulatory Visit: Payer: Self-pay

## 2022-12-05 ENCOUNTER — Encounter (HOSPITAL_COMMUNITY): Payer: Medicaid Other

## 2022-12-05 ENCOUNTER — Other Ambulatory Visit (HOSPITAL_COMMUNITY): Payer: Self-pay | Admitting: Cardiology

## 2022-12-05 ENCOUNTER — Ambulatory Visit (HOSPITAL_COMMUNITY): Payer: Medicaid Other

## 2022-12-05 ENCOUNTER — Other Ambulatory Visit (HOSPITAL_COMMUNITY): Payer: Self-pay

## 2022-12-05 MED ORDER — SPIRONOLACTONE 25 MG PO TABS
25.0000 mg | ORAL_TABLET | Freq: Every day | ORAL | 3 refills | Status: DC
  Filled 2022-12-05 – 2022-12-07 (×3): qty 30, 30d supply, fill #0

## 2022-12-05 NOTE — Progress Notes (Signed)
Cardiac Individual Treatment Plan  Patient Details  Name: Kendra Griffith MRN: [id number] Date of Birth: 1965-11-30 Referring Provider:   Flowsheet Row INTENSIVE CARDIAC REHAB ORIENT from 11/16/2022 in [facility]  Referring Provider Marca Ancona, MD       Initial Encounter Date:  Flowsheet Row INTENSIVE CARDIAC REHAB ORIENT from 11/16/2022 in [facility]  Date 11/16/22       Visit Diagnosis: Heart failure, chronic systolic (HCC)  Patient's Home Medications on Admission:  Current Outpatient Medications:    acetaminophen-codeine (TYLENOL #3) 300-30 MG tablet, Take 1-2 tablets by mouth every 4 (four) hours as needed for moderate pain., Disp: 60 tablet, Rfl: 0   albuterol (VENTOLIN HFA) 108 (90 Base) MCG/ACT inhaler, Inhale 2 puffs into the lungs every 6 (six) hours as needed for wheezing or shortness of breath., Disp: 18 g, Rfl: 0   amitriptyline (ELAVIL) 75 MG tablet, Take 1 tablet (75 mg total) by mouth at bedtime for pain and depression, Disp: 90 tablet, Rfl: 1   apixaban (ELIQUIS) 5 MG TABS tablet, Take 1 tablet (5 mg total) by mouth 2 (two) times daily., Disp: 60 tablet, Rfl: 6   Biotin 5 MG CAPS, Take 5 mg by mouth 3 (three) times daily., Disp: , Rfl:    Blood Pressure Monitor DEVI, Please provide patient with insurance approved blood pressure monitor, Disp: 1 each, Rfl: 0   cholecalciferol (VITAMIN D3) 25 MCG (1000 UT) tablet, Take 1,000 Units by mouth daily., Disp: , Rfl:    dapagliflozin propanediol (FARXIGA) 10 MG TABS tablet, Take 1 tablet (10 mg total) by mouth daily., Disp: 30 tablet, Rfl: 6   famotidine (PEPCID) 20 MG tablet, Take 1 tablet (20 mg total) by mouth 2 (two) times daily for heartburn, Disp: 180 tablet, Rfl: 1   gabapentin (NEURONTIN) 300 MG capsule, Take 1 capsule (300 mg total) by mouth 2 (two) times daily., Disp: 180 capsule, Rfl: 3    hydrOXYzine (ATARAX) 50 MG tablet, Take 1 tablet (50 mg total) by mouth 3 (three) times daily as needed. Please deliver, Disp: 90 tablet, Rfl: 2   Misc. Devices MISC, Please provide patient with incontinence pads or disposable underwear or choice. N39.3, Disp: 1 each, Rfl: PRN   omeprazole (PRILOSEC) 20 MG capsule, Take 1 capsule (20 mg total) by mouth daily for heartburn, Disp: 90 capsule, Rfl: 1   potassium chloride SA (KLOR-CON M) 20 MEQ tablet, Take 1 tablet (20 mEq total) by mouth daily., Disp: 30 tablet, Rfl: 3   rosuvastatin (CRESTOR) 5 MG tablet, Take 1 tablet (5 mg total) by mouth daily for cholesterol, Disp: 90 tablet, Rfl: 1   sacubitril-valsartan (ENTRESTO) 24-26 MG, Take 1 tablet by mouth 2 (two) times daily., Disp: 60 tablet, Rfl: 3   torsemide (DEMADEX) 20 MG tablet, Take 1 tablet (20 mg total) by mouth daily., Disp: 30 tablet, Rfl: 3   vitamin B-12 (CYANOCOBALAMIN) 500 MCG tablet, Take 500 mcg by mouth daily., Disp: , Rfl:    vitamin C (ASCORBIC ACID) 500 MG tablet, Take 500 mg by mouth daily., Disp: , Rfl:    GLUTAMINE PO, Take 500 mg by mouth daily. (Patient not taking: Reported on 11/28/2022), Disp: , Rfl:    spironolactone (ALDACTONE) 25 MG tablet, Take 1 tablet (25 mg total) by mouth at bedtime., Disp: 30 tablet, Rfl: 3  Past Medical History: Past Medical History:  Diagnosis Date  ABSCESS, TOOTH 08/20/2009   Qualifier: Diagnosis of  By: Huntley Dec, Scott     Anemia    Anxiety    Bipolar disorder (HCC)    Blood transfusion without reported diagnosis    childbirth   Chronic pain    feet and back   Clotting disorder (HCC)    childbirth   Dental caries    lesion soft palate   Depression    Diabetes mellitus without complication (HCC)    prediabetes   GERD (gastroesophageal reflux disease)    Hypertension    Injury    right side from jumping off bunkbed   Pre-diabetes    Sickle cell trait (HCC)    Wears glasses     Tobacco Use: Social History   Tobacco Use   Smoking Status Every Day   Current packs/day: 2.00   Average packs/day: 2.0 packs/day for 29.0 years (58.0 ttl pk-yrs)   Types: Cigarettes  Smokeless Tobacco Never    Labs: Review Flowsheet  More data exists      Latest Ref Rng & Units 08/27/2022 08/28/2022 08/29/2022 08/30/2022 08/31/2022  Labs for ITP Cardiac and Pulmonary Rehab  Cholestrol 0 - 200 mg/dL 77  - - - -  LDL (calc) 0 - 99 mg/dL 42  - - - -  HDL-C >01 mg/dL 20  - - - -  Trlycerides <150 mg/dL 75  - - - -  Hemoglobin A1c 4.8 - 5.6 % 6.1  - - - -  PH, Arterial 7.35 - 7.45 - - 7.453  - -  PCO2 arterial 32 - 48 mmHg - - 32.8  - -  Bicarbonate 20.0 - 28.0 mmol/L - - 28.0  28.5  23.0  - -  TCO2 22 - 32 mmol/L - - 29  30  24   - -  O2 Saturation % 80.6  56.5  67.8  91.8  57  54  93  60.2  61.5  61.4     Details       Multiple values from one day are sorted in reverse-chronological order         Capillary Blood Glucose: Lab Results  Component Value Date   GLUCAP 87 11/28/2022   GLUCAP 92 11/28/2022   GLUCAP 112 (H) 11/23/2022   GLUCAP 108 (H) 11/23/2022   GLUCAP 91 11/16/2022     Exercise Target Goals: Exercise Program Goal: Individual exercise prescription set using results from initial 6 min walk test and THRR while considering  patient's activity barriers and safety.   Exercise Prescription Goal: Initial exercise prescription builds to 30-45 minutes a day of aerobic activity, 2-3 days per week.  Home exercise guidelines will be given to patient during program as part of exercise prescription that the participant will acknowledge.  Activity Barriers & Risk Stratification:  Activity Barriers & Cardiac Risk Stratification - 11/16/22 0272       Activity Barriers & Cardiac Risk Stratification   Activity Barriers Back Problems;Deconditioning;Decreased Ventricular Function;Balance Concerns;Other (comment)    Comments neuropathy both feet    Cardiac Risk Stratification High   <5 METs on            6  Minute Walk:  6 Minute Walk     Row Name 11/16/22 1116         6 Minute Walk   Phase Initial     Distance 1180 feet     Walk Time 6 minutes     # of Rest Breaks 0  MPH 2.23     METS 3.59     RPE 7     Perceived Dyspnea  0     Symptoms Yes (comment)     Comments 10/10 bilateral feet pain-chronic. resolved with rest. used go cart     Resting HR 72 bpm     Resting BP 93/64     Resting Oxygen Saturation  100 %     Exercise Oxygen Saturation  during 6 min walk 100 %     Max Ex. HR 106 bpm     Max Ex. BP 107/73     2 Minute Post BP 95/65              Oxygen Initial Assessment:   Oxygen Re-Evaluation:   Oxygen Discharge (Final Oxygen Re-Evaluation):   Initial Exercise Prescription:  Initial Exercise Prescription - 11/16/22 1300       Date of Initial Exercise RX and Referring Provider   Date 11/16/22    Referring Provider Marca Ancona, MD    Expected Discharge Date 02/08/23      NuStep   Level 1    SPM 60    Minutes 15    METs 2      Recumbant Elliptical   Level 1    RPM 60    Watts 80    Minutes 15    METs 2.5      Prescription Details   Frequency (times per week) 3    Duration Progress to 30 minutes of continuous aerobic without signs/symptoms of physical distress      Intensity   THRR 40-80% of Max Heartrate 65-131    Ratings of Perceived Exertion 11-13    Perceived Dyspnea 0-4      Progression   Progression Continue progressive overload as per policy without signs/symptoms or physical distress.      Resistance Training   Training Prescription Yes    Weight 3    Reps 10-15             Perform Capillary Blood Glucose checks as needed.  Exercise Prescription Changes:   Exercise Prescription Changes     Row Name 11/23/22 1022 11/28/22 1021           Response to Exercise   Blood Pressure (Admit) 94/56 97/62      Blood Pressure (Exercise) 110/62 94/64      Blood Pressure (Exit) 114/66 107/78      Heart Rate (Admit) 83 bpm  84 bpm      Heart Rate (Exercise) 102 bpm 113 bpm      Heart Rate (Exit) 71 bpm 93 bpm      Rating of Perceived Exertion (Exercise) 11 11      Symptoms None None      Comments Off to a good start with exercise. --      Duration Continue with 30 min of aerobic exercise without signs/symptoms of physical distress. Continue with 30 min of aerobic exercise without signs/symptoms of physical distress.      Intensity THRR unchanged THRR unchanged        Progression   Progression Continue to progress workloads to maintain intensity without signs/symptoms of physical distress. Continue to progress workloads to maintain intensity without signs/symptoms of physical distress.      Average METs 1.8 2.3        Resistance Training   Training Prescription No  Relaxation day, no weights Yes      Weight -- 3 lbs  Reps -- 10-15      Time -- 10 Minutes        Interval Training   Interval Training No No        NuStep   Level 1 1      SPM 69 84      Minutes 15 15      METs 1.8 2.3        Recumbant Elliptical   Level 1 1      RPM -- 39      Watts -- 50      Minutes 15 15      METs -- 2.3               Exercise Comments:   Exercise Comments     Row Name 11/23/22 1133           Exercise Comments Shaquandra tolerated low internsity exercise well without symptoms.                Exercise Goals and Review:   Exercise Goals     Row Name 11/16/22 0802             Exercise Goals   Increase Physical Activity Yes       Intervention Provide advice, education, support and counseling about physical activity/exercise needs.;Develop an individualized exercise prescription for aerobic and resistive training based on initial evaluation findings, risk stratification, comorbidities and participant's personal goals.       Expected Outcomes Short Term: Attend rehab on a regular basis to increase amount of physical activity.;Long Term: Exercising regularly at least 3-5 days a week.;Long  Term: Add in home exercise to make exercise part of routine and to increase amount of physical activity.       Increase Strength and Stamina Yes       Intervention Provide advice, education, support and counseling about physical activity/exercise needs.;Develop an individualized exercise prescription for aerobic and resistive training based on initial evaluation findings, risk stratification, comorbidities and participant's personal goals.       Expected Outcomes Short Term: Perform resistance training exercises routinely during rehab and add in resistance training at home;Short Term: Increase workloads from initial exercise prescription for resistance, speed, and METs.;Long Term: Improve cardiorespiratory fitness, muscular endurance and strength as measured by increased METs and functional capacity ( )       Able to understand and use rate of perceived exertion (RPE) scale Yes       Intervention Provide education and explanation on how to use RPE scale       Expected Outcomes Short Term: Able to use RPE daily in rehab to express subjective intensity level;Long Term:  Able to use RPE to guide intensity level when exercising independently       Knowledge and understanding of Target Heart Rate Range (THRR) Yes       Intervention Provide education and explanation of THRR including how the numbers were predicted and where they are located for reference       Expected Outcomes Short Term: Able to state/look up THRR;Short Term: Able to use daily as guideline for intensity in rehab;Long Term: Able to use THRR to govern intensity when exercising independently       Understanding of Exercise Prescription Yes       Intervention Provide education, explanation, and written materials on patient's individual exercise prescription       Expected Outcomes Short Term: Able to explain program exercise prescription;Long Term: Able to explain home exercise prescription  to exercise independently                 Exercise Goals Re-Evaluation :  Exercise Goals Re-Evaluation     Row Name 11/23/22 1133 12/05/22 0814           Exercise Goal Re-Evaluation   Exercise Goals Review Increase Physical Activity;Increase Strength and Stamina;Able to understand and use rate of perceived exertion (RPE) scale Increase Physical Activity;Increase Strength and Stamina;Able to understand and use rate of perceived exertion (RPE) scale      Comments Patient able to understand and use RPE scale appropriately. Patient havin issues with painful toenails. Exercise on hold pending appointment with podiatrist this week.      Expected Outcomes Progress workloads as tolerated to help improve strength and stamina. Will review goals upon return to exercise.               Discharge Exercise Prescription (Final Exercise Prescription Changes):  Exercise Prescription Changes - 11/28/22 1021       Response to Exercise   Blood Pressure (Admit) 97/62    Blood Pressure (Exercise) 94/64    Blood Pressure (Exit) 107/78    Heart Rate (Admit) 84 bpm    Heart Rate (Exercise) 113 bpm    Heart Rate (Exit) 93 bpm    Rating of Perceived Exertion (Exercise) 11    Symptoms None    Duration Continue with 30 min of aerobic exercise without signs/symptoms of physical distress.    Intensity THRR unchanged      Progression   Progression Continue to progress workloads to maintain intensity without signs/symptoms of physical distress.    Average METs 2.3      Resistance Training   Training Prescription Yes    Weight 3 lbs    Reps 10-15    Time 10 Minutes      Interval Training   Interval Training No      NuStep   Level 1    SPM 84    Minutes 15    METs 2.3      Recumbant Elliptical   Level 1    RPM 39    Watts 50    Minutes 15    METs 2.3             Nutrition:  Target Goals: Understanding of nutrition guidelines, daily intake of sodium 1500mg , cholesterol 200mg , calories 30% from fat and 7% or less from  saturated fats, daily to have 5 or more servings of fruits and vegetables.  Biometrics:  Pre Biometrics - 11/16/22 0837       Pre Biometrics   Waist Circumference 34 inches    Hip Circumference 37 inches    Waist to Hip Ratio 0.92 %    Triceps Skinfold 36 mm    % Body Fat 37.6 %    Grip Strength 22 kg    Flexibility 0 in   not done due to low back pain   Single Leg Stand 11.06 seconds              Nutrition Therapy Plan and Nutrition Goals:  Nutrition Therapy & Goals - 11/23/22 1613       Nutrition Therapy   Diet Heart Healthy Diet    Drug/Food Interactions Statins/Certain Fruits      Personal Nutrition Goals   Nutrition Goal Patient to identify strategies for reducing cardiovascular risk by attending the [employer] education and nutrition series weekly.    Personal Goal #2 Patient to improve  diet quality by using the plate method as a guide for meal planning to include lean protein/plant protein, fruits, vegetables, whole grains, nonfat dairy as part of a well-balanced diet.    Personal Goal #3 Patient to reduce sodium intake to 2000mg  per day    Comments Annissia reports motivation to reduce sugar intake (sugary coffee drinks) and continue to improve eating habits. She does report food insecurity and difficulty getting to the grocery store; our nursing staff and [facility] continue to work on getting resources to her. She does enjoy a wide variety of foods including fish and fruits/vegetables. Patient will benefit from participation in intensive cardiac rehab for nutrition, exercise, and lifestyle modification.      Intervention Plan   Intervention Prescribe, educate and counsel regarding individualized specific dietary modifications aiming towards targeted core components such as weight, hypertension, lipid management, diabetes, heart failure and other comorbidities.;Nutrition handout(s) given to patient.    Expected Outcomes Short Term  Goal: Understand basic principles of dietary content, such as calories, fat, sodium, cholesterol and nutrients.;Long Term Goal: Adherence to prescribed nutrition plan.             Nutrition Assessments:  MEDIFICTS Score Key: ?70 Need to make dietary changes  40-70 Heart Healthy Diet ? 40 Therapeutic Level Cholesterol Diet    Picture Your Plate Scores: <74 Unhealthy dietary pattern with much room for improvement. 41-50 Dietary pattern unlikely to meet recommendations for good health and room for improvement. 51-60 More healthful dietary pattern, with some room for improvement.  >60 Healthy dietary pattern, although there may be some specific behaviors that could be improved.    Nutrition Goals Re-Evaluation:  Nutrition Goals Re-Evaluation     Row Name 11/23/22 1613             Goals   Current Weight 150 lb 9.2 oz (68.3 kg)       Comment lipids WNL, HDL 20, A1c 6.1, Cr 1.09, GFR 60       Expected Outcome Miabella reports motivation to reduce sugar intake (sugary coffee drinks) and continue to improve eating habits. She does report food insecurity and difficulty getting to the grocery store; our nursing staff and the social worker [facility] continue to work on getting resources to her. She does enjoy a wide variety of foods including fish and fruits/vegetables. Patient will benefit from participation in intensive cardiac rehab for nutrition, exercise, and lifestyle modification.                Nutrition Goals Re-Evaluation:  Nutrition Goals Re-Evaluation     Row Name 11/23/22 1613             Goals   Current Weight 150 lb 9.2 oz (68.3 kg)       Comment lipids WNL, HDL 20, A1c 6.1, Cr 1.09, GFR 60       Expected Outcome Aleaha reports motivation to reduce sugar intake (sugary coffee drinks) and continue to improve eating habits. She does report food insecurity and difficulty getting to the grocery store; our nursing staff and the social worker at the  [facility] continue to work on getting resources to her. She does enjoy a wide variety of foods including fish and fruits/vegetables. Patient will benefit from participation in intensive cardiac rehab for nutrition, exercise, and lifestyle modification.                Nutrition Goals Discharge (Final Nutrition  Goals Re-Evaluation):  Nutrition Goals Re-Evaluation - 11/23/22 1613       Goals   Current Weight 150 lb 9.2 oz (68.3 kg)    Comment lipids WNL, HDL 20, A1c 6.1, Cr 1.09, GFR 60    Expected Outcome Aliyahna reports motivation to reduce sugar intake (sugary coffee drinks) and continue to improve eating habits. She does report food insecurity and difficulty getting to the grocery store; our nursing staff and the social worker at [facility] continue to work on getting resources to her. She does enjoy a wide variety of foods including fish and fruits/vegetables. Patient will benefit from participation in intensive cardiac rehab for nutrition, exercise, and lifestyle modification.             Psychosocial: Target Goals: Acknowledge presence or absence of significant depression and/or stress, maximize coping skills, provide positive support system. Participant is able to verbalize types and ability to use techniques and skills needed for reducing stress and depression.  Initial Review & Psychosocial Screening:  Initial Psych Review & Screening - 11/16/22 1030       Initial Review   Current issues with Current Depression;Current Anxiety/Panic;Current Sleep Concerns;Current Stress Concerns    Source of Stress Concerns Chronic Illness;Financial    Comments Keity shared that she has low levels of depression and anxiety currently. She has a long history of depression and is connected with several resources through behavioral health and social work. She denies any further need for resources at this time and is trying to get reconnected with counseling through  behavioral health.      Family Dynamics   Good Support System? Yes   Aniza has her children for support     Barriers   Psychosocial barriers to participate in program The patient should benefit from training in stress management and relaxation.      Screening Interventions   Interventions Encouraged to exercise;Provide feedback about the scores to participant;To provide support and resources with identified psychosocial needs    Expected Outcomes Short Term goal: Utilizing psychosocial counselor, staff and physician to assist with identification of specific Stressors or current issues interfering with healing process. Setting desired goal for each stressor or current issue identified.;Long Term Goal: Stressors or current issues are controlled or eliminated.;Short Term goal: Identification and review with participant of any Quality of Life or Depression concerns found by scoring the questionnaire.;Long Term goal: The participant improves quality of Life and PHQ9 Scores as seen by post scores and/or verbalization of changes             Quality of Life Scores:  Quality of Life - 11/16/22 1117       Quality of Life   Select Quality of Life      Quality of Life Scores   Health/Function Pre 11.2 %    Socioeconomic Pre 7.5 %    Psych/Spiritual Pre 24 %    Family Pre 22.8 %    GLOBAL Pre 14.57 %            Scores of 19 and below usually indicate a poorer quality of life in these areas.  A difference of  2-3 points is a clinically meaningful difference.  A difference of 2-3 points in the total score of the Quality of Life Index has been associated with significant improvement in overall quality of life, self-image, physical symptoms, and general health in studies assessing change in quality of life.  PHQ-9: Review Flowsheet  More data exists  11/16/2022 10/11/2022 08/17/2022 05/18/2022 01/05/2022  Depression screen PHQ 2/9  Decreased Interest 2 1 2 1 2   Down, Depressed, Hopeless 2 2  2 1 2   PHQ - 2 Score 4 3 4 2 4   Altered sleeping 2 1 2 1 2   Tired, decreased energy 2 1 2 1 2   Change in appetite 2 1 2 1 2   Feeling bad or failure about yourself  2 1 2 1 2   Trouble concentrating 2 2 2 1 2   Moving slowly or fidgety/restless 2 3 2 1 2   Suicidal thoughts 1 0 2 1 2   PHQ-9 Score 17 12 18 9 18   Difficult doing work/chores Not difficult at all - - - -    Details           Interpretation of Total Score  Total Score Depression Severity:  1-4 = Minimal depression, 5-9 = Mild depression, 10-14 = Moderate depression, 15-19 = Moderately severe depression, 20-27 = Severe depression   Psychosocial Evaluation and Intervention:   Psychosocial Re-Evaluation:  Psychosocial Re-Evaluation     Row Name 12/02/22 1504             Psychosocial Re-Evaluation   Current issues with Current Sleep Concerns;Current Stress Concerns;Current Depression;Current Anxiety/Panic       Comments Will review quality of life and PHQ2-9 upon return to exercise.       Expected Outcomes Arteria will have controlled or decreased depression/ stressors  upon completion of cardiac rehab       Interventions Stress management education;Encouraged to attend Cardiac Rehabilitation for the exercise;Relaxation education       Continue Psychosocial Services  Follow up required by staff         Initial Review   Source of Stress Concerns Chronic Illness;Unable to participate in former interests or hobbies;Unable to perform yard/household activities;Financial       Comments will continue to monitor and offer support as needed                Psychosocial Discharge (Final Psychosocial Re-Evaluation):  Psychosocial Re-Evaluation - 12/02/22 1504       Psychosocial Re-Evaluation   Current issues with Current Sleep Concerns;Current Stress Concerns;Current Depression;Current Anxiety/Panic    Comments Will review quality of life and PHQ2-9 upon return to exercise.    Expected Outcomes Umme will have  controlled or decreased depression/ stressors  upon completion of cardiac rehab    Interventions Stress management education;Encouraged to attend Cardiac Rehabilitation for the exercise;Relaxation education    Continue Psychosocial Services  Follow up required by staff      Initial Review   Source of Stress Concerns Chronic Illness;Unable to participate in former interests or hobbies;Unable to perform yard/household activities;Financial    Comments will continue to monitor and offer support as needed             Vocational Rehabilitation: Provide vocational rehab assistance to qualifying candidates.   Vocational Rehab Evaluation & Intervention:  Vocational Rehab - 11/16/22 1610       Initial Vocational Rehab Evaluation & Intervention   Assessment shows need for Vocational Rehabilitation No   Kayleen is on disability            Education: Education Goals: Education classes will be provided on a weekly basis, covering required topics. Participant will state understanding/return demonstration of topics presented.    Education     Row Name 11/23/22 1500     Education   Cardiac Education Topics [employer]  Customer service manager   Weekly Topic Powerhouse Plant-Based Proteins   Instruction Review Code 1- Verbalizes Understanding   Class Start Time 1145   Class Stop Time 1228   Class Time Calculation (min) 43 min    Row Name 11/28/22 1100     Education   Cardiac Education Topics Pritikin   Geographical information systems officer Psychosocial   Psychosocial Workshop From Head to Heart: The Power of a Healthy Outlook   Instruction Review Code 1- Verbalizes Understanding   Class Start Time 1155   Class Stop Time 1245   Class Time Calculation (min) 50 min            Core Videos: Exercise    Move It!  Clinical staff conducted group or individual video education with verbal and written  material and guidebook.  Patient learns the recommended Pritikin exercise program. Exercise with the goal of living a long, healthy life. Some of the health benefits of exercise include controlled diabetes, healthier blood pressure levels, improved cholesterol levels, improved heart and lung capacity, improved sleep, and better body composition. Everyone should speak with their doctor before starting or changing an exercise routine.  Biomechanical Limitations Clinical staff conducted group or individual video education with verbal and written material and guidebook.  Patient learns how biomechanical limitations can impact exercise and how we can mitigate and possibly overcome limitations to have an impactful and balanced exercise routine.  Body Composition Clinical staff conducted group or individual video education with verbal and written material and guidebook.  Patient learns that body composition (ratio of muscle mass to fat mass) is a key component to assessing overall fitness, rather than body weight alone. Increased fat mass, especially visceral belly fat, can put [place] at increased risk for metabolic syndrome, type 2 diabetes, heart disease, and even death. It is recommended to combine diet and exercise (cardiovascular and resistance training) to improve your body composition. Seek guidance from your physician and exercise physiologist before implementing an exercise routine.  Exercise Action Plan Clinical staff conducted group or individual video education with verbal and written material and guidebook.  Patient learns the recommended strategies to achieve and enjoy long-term exercise adherence, including variety, self-motivation, self-efficacy, and positive decision making. Benefits of exercise include fitness, good health, weight management, more energy, better sleep, less stress, and overall well-being.  Medical   Heart Disease Risk Reduction Clinical staff conducted group or individual  video education with verbal and written material and guidebook.  Patient learns our heart is our most vital organ as it circulates oxygen, nutrients, white blood cells, and hormones throughout the entire body, and carries waste away. Data supports a plant-based eating plan like the Pritikin Program for its effectiveness in slowing progression of and reversing heart disease. The video provides a number of recommendations to address heart disease.   Metabolic Syndrome and Belly Fat  Clinical staff conducted group or individual video education with verbal and written material and guidebook.  Patient learns what metabolic syndrome is, how it leads to heart disease, and how one can reverse it and keep it from coming back. You have metabolic syndrome if you have 3 of the following 5 criteria: abdominal obesity, high blood pressure, high triglycerides, low HDL cholesterol, and high blood sugar.  Hypertension and Heart Disease Clinical staff conducted group or individual video education with verbal and written material and guidebook.  Patient learns that high blood pressure, or hypertension, is very common in the [place]. Hypertension is largely due to excessive salt intake, but other important risk factors include being overweight, physical inactivity, drinking too much alcohol, smoking, and not eating enough potassium from fruits and vegetables. High blood pressure is a leading risk factor for heart attack, stroke, congestive heart failure, dementia, kidney failure, and premature death. Long-term effects of excessive salt intake include stiffening of the arteries and thickening of heart muscle and organ damage. Recommendations include ways to reduce hypertension and the risk of heart disease.  Diseases of Our Time - Focusing on Diabetes Clinical staff conducted group or individual video education with verbal and written material and guidebook.  Patient learns why the best way to stop diseases of our  time is prevention, through food and other lifestyle changes. Medicine (such as prescription pills and surgeries) is often only a Band-Aid on the problem, not a long-term solution. Most common diseases of our time include obesity, type 2 diabetes, hypertension, heart disease, and cancer. The Pritikin Program is recommended and has been proven to help reduce, reverse, and/or prevent the damaging effects of metabolic syndrome.  Nutrition   Overview of the Pritikin Eating Plan  Clinical staff conducted group or individual video education with verbal and written material and guidebook.  Patient learns about the Pritikin Eating Plan for disease risk reduction. The Pritikin Eating Plan emphasizes a wide variety of unrefined, minimally-processed carbohydrates, like fruits, vegetables, whole grains, and legumes. Go, Caution, and Stop food choices are explained. Plant-based and lean animal proteins are emphasized. Rationale provided for low sodium intake for blood pressure control, low added sugars for blood sugar stabilization, and low added fats and oils for coronary artery disease risk reduction and weight management.  Calorie Density  Clinical staff conducted group or individual video education with verbal and written material and guidebook.  Patient learns about calorie density and how it impacts the Pritikin Eating Plan. Knowing the characteristics of the food you choose will help you decide whether those foods will lead to weight gain or weight loss, and whether you want to consume more or less of them. Weight loss is usually a side effect of the Pritikin Eating Plan because of its focus on low calorie-dense foods.  Label Reading  Clinical staff conducted group or individual video education with verbal and written material and guidebook.  Patient learns about the Pritikin recommended label reading guidelines and corresponding recommendations regarding calorie density, added sugars, sodium content, and  whole grains.  Dining Out - Part 1  Clinical staff conducted group or individual video education with verbal and written material and guidebook.  Patient learns that restaurant meals can be sabotaging because they can be so high in calories, fat, sodium, and/or sugar. Patient learns recommended strategies on how to positively address this and avoid unhealthy pitfalls.  Facts on Fats  Clinical staff conducted group or individual video education with verbal and written material and guidebook.  Patient learns that lifestyle modifications can be just as effective, if not more so, as many medications for lowering your risk of heart disease. A Pritikin lifestyle can help to reduce your risk of inflammation and atherosclerosis (cholesterol build-up, or plaque, in the artery walls). Lifestyle interventions such as dietary choices and physical activity address the cause of atherosclerosis. A review of the types of fats and their impact on blood cholesterol levels, along with dietary recommendations to reduce fat intake is also included.  Nutrition Action  Plan  Clinical staff conducted group or individual video education with verbal and written material and guidebook.  Patient learns how to incorporate Pritikin recommendations into their lifestyle. Recommendations include planning and keeping personal health goals in mind as an important part of their success.  Healthy Mind-Set    Healthy Minds, Bodies, Hearts  Clinical staff conducted group or individual video education with verbal and written material and guidebook.  Patient learns how to identify when they are stressed. Video will discuss the impact of that stress, as well as the many benefits of stress management. Patient will also be introduced to stress management techniques. The way we think, act, and feel has an impact on our hearts.  How Our Thoughts Can Heal Our Hearts  Clinical staff conducted group or individual video education with verbal and  written material and guidebook.  Patient learns that negative thoughts can cause depression and anxiety. This can result in negative lifestyle behavior and serious health problems. Cognitive behavioral therapy is an effective method to help control our thoughts in order to change and improve our emotional outlook.  Additional Videos:  Exercise    Improving Performance  Clinical staff conducted group or individual video education with verbal and written material and guidebook.  Patient learns to use a non-linear approach by alternating intensity levels and lengths of time spent exercising to help burn more calories and lose more body fat. Cardiovascular exercise helps improve heart health, metabolism, hormonal balance, blood sugar control, and recovery from fatigue. Resistance training improves strength, endurance, balance, coordination, reaction time, metabolism, and muscle mass. Flexibility exercise improves circulation, posture, and balance. Seek guidance from your physician and exercise physiologist before implementing an exercise routine and learn your capabilities and proper form for all exercise.  Introduction to Yoga  Clinical staff conducted group or individual video education with verbal and written material and guidebook.  Patient learns about yoga, a discipline of the coming together of mind, breath, and body. The benefits of yoga include improved flexibility, improved range of motion, better posture and core strength, increased lung function, weight loss, and positive self-image. Yoga's heart health benefits include lowered blood pressure, healthier heart rate, decreased cholesterol and triglyceride levels, improved immune function, and reduced stress. Seek guidance from your physician and exercise physiologist before implementing an exercise routine and learn your capabilities and proper form for all exercise.  Medical   Aging: Enhancing Your Quality of Life  Clinical staff conducted  group or individual video education with verbal and written material and guidebook.  Patient learns key strategies and recommendations to stay in good physical health and enhance quality of life, such as prevention strategies, having an advocate, securing a Health Care Proxy and Power of Attorney, and keeping a list of medications and system for tracking them. It also discusses how to avoid risk for bone loss.  Biology of Weight Control  Clinical staff conducted group or individual video education with verbal and written material and guidebook.  Patient learns that weight gain occurs because we consume more calories than we burn (eating more, moving less). Even if your body weight is normal, you may have higher ratios of fat compared to muscle mass. Too much body fat puts you at increased risk for cardiovascular disease, heart attack, stroke, type 2 diabetes, and obesity-related cancers. In addition to exercise, following the Pritikin Eating Plan can help reduce your risk.  Decoding Lab Results  Clinical staff conducted group or individual video education with verbal and written material and  guidebook.  Patient learns that lab test reflects one measurement whose values change over time and are influenced by many factors, including medication, stress, sleep, exercise, food, hydration, pre-existing medical conditions, and more. It is recommended to use the knowledge from this video to become more involved with your lab results and evaluate your numbers to speak with your doctor.   Diseases of Our Time - Overview  Clinical staff conducted group or individual video education with verbal and written material and guidebook.  Patient learns that according to the CDC, 50% to 70% of chronic diseases (such as obesity, type 2 diabetes, elevated lipids, hypertension, and heart disease) are avoidable through lifestyle improvements including healthier food choices, listening to satiety cues, and increased physical  activity.  Sleep Disorders Clinical staff conducted group or individual video education with verbal and written material and guidebook.  Patient learns how good quality and duration of sleep are important to overall health and well-being. Patient also learns about sleep disorders and how they impact health along with recommendations to address them, including discussing with a physician.  Nutrition  Dining Out - Part 2 Clinical staff conducted group or individual video education with verbal and written material and guidebook.  Patient learns how to plan ahead and communicate in order to maximize their dining experience in a healthy and nutritious manner. Included are recommended food choices based on the type of restaurant the patient is visiting.   Fueling a Banker conducted group or individual video education with verbal and written material and guidebook.  There is a strong connection between our food choices and our health. Diseases like obesity and type 2 diabetes are very prevalent and are in large-part due to lifestyle choices. The Pritikin Eating Plan provides plenty of food and hunger-curbing satisfaction. It is easy to follow, affordable, and helps reduce health risks.  Menu Workshop  Clinical staff conducted group or individual video education with verbal and written material and guidebook.  Patient learns that restaurant meals can sabotage health goals because they are often packed with calories, fat, sodium, and sugar. Recommendations include strategies to plan ahead and to communicate with the manager, chef, or server to help order a healthier meal.  Planning Your Eating Strategy  Clinical staff conducted group or individual video education with verbal and written material and guidebook.  Patient learns about the Pritikin Eating Plan and its benefit of reducing the risk of disease. The Pritikin Eating Plan does not focus on calories. Instead, it emphasizes  high-quality, nutrient-rich foods. By knowing the characteristics of the foods, we choose, we can determine their calorie density and make informed decisions.  Targeting Your Nutrition Priorities  Clinical staff conducted group or individual video education with verbal and written material and guidebook.  Patient learns that lifestyle habits have a tremendous impact on disease risk and progression. This video provides eating and physical activity recommendations based on your personal health goals, such as reducing LDL cholesterol, losing weight, preventing or controlling type 2 diabetes, and reducing high blood pressure.  Vitamins and Minerals  Clinical staff conducted group or individual video education with verbal and written material and guidebook.  Patient learns different ways to obtain key vitamins and minerals, including through a recommended healthy diet. It is important to discuss all supplements you take with your doctor.   Healthy Mind-Set    Smoking Cessation  Clinical staff conducted group or individual video education with verbal and written material and guidebook.  Patient learns that  cigarette smoking and tobacco addiction pose a serious health risk which affects millions of people. Stopping smoking will significantly reduce the risk of heart disease, lung disease, and many forms of cancer. Recommended strategies for quitting are covered, including working with your doctor to develop a successful plan.  Culinary   Becoming a Set designer conducted group or individual video education with verbal and written material and guidebook.  Patient learns that cooking at home can be healthy, cost-effective, quick, and puts them in control. Keys to cooking healthy recipes will include looking at your recipe, assessing your equipment needs, planning ahead, making it simple, choosing cost-effective seasonal ingredients, and limiting the use of added fats, salts, and  sugars.  Cooking - Breakfast and Snacks  Clinical staff conducted group or individual video education with verbal and written material and guidebook.  Patient learns how important breakfast is to satiety and nutrition through the entire day. Recommendations include key foods to eat during breakfast to help stabilize blood sugar levels and to prevent overeating at meals later in the day. Planning ahead is also a key component.  Cooking - Educational psychologist conducted group or individual video education with verbal and written material and guidebook.  Patient learns eating strategies to improve overall health, including an approach to cook more at home. Recommendations include thinking of animal protein as a side on your plate rather than center stage and focusing instead on lower calorie dense options like vegetables, fruits, whole grains, and plant-based proteins, such as beans. Making sauces in large quantities to freeze for later and leaving the skin on your vegetables are also recommended to maximize your experience.  Cooking - Healthy Salads and Dressing Clinical staff conducted group or individual video education with verbal and written material and guidebook.  Patient learns that vegetables, fruits, whole grains, and legumes are the foundations of the Pritikin Eating Plan. Recommendations include how to incorporate each of these in flavorful and healthy salads, and how to create homemade salad dressings. Proper handling of ingredients is also covered. Cooking - Soups and [employer] - Soups and Desserts Clinical staff conducted group or individual video education with verbal and written material and guidebook.  Patient learns that Pritikin soups and desserts make for easy, nutritious, and delicious snacks and meal components that are low in sodium, fat, sugar, and calorie density, while high in vitamins, minerals, and filling fiber. Recommendations include simple and healthy  ideas for soups and desserts.   Overview     The Pritikin Solution Program Overview Clinical staff conducted group or individual video education with verbal and written material and guidebook.  Patient learns that the results of the Pritikin Program have been documented in more than 100 articles published in peer-reviewed journals, and the benefits include reducing risk factors for (and, in some cases, even reversing) high cholesterol, high blood pressure, type 2 diabetes, obesity, and more! An overview of the three key pillars of the Pritikin Program will be covered: eating well, doing regular exercise, and having a healthy mind-set.  WORKSHOPS  Exercise: Exercise Basics: Building Your Action Plan Clinical staff led group instruction and group discussion with PowerPoint presentation and patient guidebook. To enhance the learning environment the use of posters, models and videos may be added. At the conclusion of this workshop, patients will comprehend the difference between physical activity and exercise, as well as the benefits of incorporating both, into their routine. Patients will understand the FITT (Frequency, Intensity,  Time, and Type) principle and how to use it to build an exercise action plan. In addition, safety concerns and other considerations for exercise and cardiac rehab will be addressed by the presenter. The purpose of this lesson is to promote a comprehensive and effective weekly exercise routine in order to improve patients' overall level of fitness.   Managing Heart Disease: Your Path to a Healthier Heart Clinical staff led group instruction and group discussion with PowerPoint presentation and patient guidebook. To enhance the learning environment the use of posters, models and videos may be added.At the conclusion of this workshop, patients will understand the anatomy and physiology of the heart. Additionally, they will understand how Pritikin's three pillars impact the  risk factors, the progression, and the management of heart disease.  The purpose of this lesson is to provide a high-level overview of the heart, heart disease, and how the Pritikin lifestyle positively impacts risk factors.  Exercise Biomechanics Clinical staff led group instruction and group discussion with PowerPoint presentation and patient guidebook. To enhance the learning environment the use of posters, models and videos may be added. Patients will learn how the structural parts of their bodies function and how these functions impact their daily activities, movement, and exercise. Patients will learn how to promote a neutral spine, learn how to manage pain, and identify ways to improve their physical movement in order to promote healthy living. The purpose of this lesson is to expose patients to common physical limitations that impact physical activity. Participants will learn practical ways to adapt and manage aches and pains, and to minimize their effect on regular exercise. Patients will learn how to maintain good posture while sitting, walking, and lifting.  Balance Training and Fall Prevention  Clinical staff led group instruction and group discussion with PowerPoint presentation and patient guidebook. To enhance the learning environment the use of posters, models and videos may be added. At the conclusion of this workshop, patients will understand the importance of their sensorimotor skills (vision, proprioception, and the vestibular system) in maintaining their ability to balance as they age. Patients will apply a variety of balancing exercises that are appropriate for their current level of function. Patients will understand the common causes for poor balance, possible solutions to these problems, and ways to modify their physical environment in order to minimize their fall risk. The purpose of this lesson is to teach patients about the importance of maintaining balance as they age  and ways to minimize their risk of falling.  WORKSHOPS   Nutrition:  Fueling a Ship broker led group instruction and group discussion with PowerPoint presentation and patient guidebook. To enhance the learning environment the use of posters, models and videos may be added. Patients will review the foundational principles of the Pritikin Eating Plan and understand what constitutes a serving size in each of the food groups. Patients will also learn Pritikin-friendly foods that are better choices when away from home and review make-ahead meal and snack options. Calorie density will be reviewed and applied to three nutrition priorities: weight maintenance, weight loss, and weight gain. The purpose of this lesson is to reinforce (in a group setting) the key concepts around what patients are recommended to eat and how to apply these guidelines when away from home by planning and selecting Pritikin-friendly options. Patients will understand how calorie density may be adjusted for different weight management goals.  Mindful Eating  Clinical staff led group instruction and group discussion with PowerPoint presentation and patient  guidebook. To enhance the learning environment the use of posters, models and videos may be added. Patients will briefly review the concepts of the Pritikin Eating Plan and the importance of low-calorie dense foods. The concept of mindful eating will be introduced as well as the importance of paying attention to internal hunger signals. Triggers for non-hunger eating and techniques for dealing with triggers will be explored. The purpose of this lesson is to provide patients with the opportunity to review the basic principles of the Pritikin Eating Plan, discuss the value of eating mindfully and how to measure internal cues of hunger and fullness using the Hunger Scale. Patients will also discuss reasons for non-hunger eating and learn strategies to use for controlling  emotional eating.  Targeting Your Nutrition Priorities Clinical staff led group instruction and group discussion with PowerPoint presentation and patient guidebook. To enhance the learning environment the use of posters, models and videos may be added. Patients will learn how to determine their genetic susceptibility to disease by reviewing their family history. Patients will gain insight into the importance of diet as part of an overall healthy lifestyle in mitigating the impact of genetics and other environmental insults. The purpose of this lesson is to provide patients with the opportunity to assess their personal nutrition priorities by looking at their family history, their own health history and current risk factors. Patients will also be able to discuss ways of prioritizing and modifying the Pritikin Eating Plan for their highest risk areas  Menu  Clinical staff led group instruction and group discussion with PowerPoint presentation and patient guidebook. To enhance the learning environment the use of posters, models and videos may be added. Using menus brought in from [employer], or printed from [employer], patients will apply the Pritikin dining out guidelines that were presented in the [employer] video. Patients will also be able to practice these guidelines in a variety of provided scenarios. The purpose of this lesson is to provide patients with the opportunity to practice hands-on learning of the Pritikin Dining Out guidelines with actual menus and practice scenarios.  Label Reading Clinical staff led group instruction and group discussion with PowerPoint presentation and patient guidebook. To enhance the learning environment the use of posters, models and videos may be added. Patients will review and discuss the Pritikin label reading guidelines presented in [employer]'s Label Reading Educational series video. Using fool labels brought in from local grocery stores  and markets, patients will apply the label reading guidelines and determine if the packaged food meet the Pritikin guidelines. The purpose of this lesson is to provide patients with the opportunity to review, discuss, and practice hands-on learning of the Pritikin Label Reading guidelines with actual packaged food labels. Cooking School  Pritikin's [employer] are designed to teach patients ways to prepare quick, simple, and affordable recipes at home. The importance of nutrition's role in chronic disease risk reduction is reflected in its emphasis in the overall Pritikin program. By learning how to prepare essential core Pritikin Eating Plan recipes, patients will increase control over what they eat; be able to customize the flavor of foods without the use of added salt, sugar, or fat; and improve the quality of the food they consume. By learning a set of core recipes which are easily assembled, quickly prepared, and affordable, patients are more likely to prepare more healthy foods at home. These workshops focus on convenient breakfasts, simple entres, side dishes, and desserts which can be prepared with  minimal effort and are consistent with nutrition recommendations for cardiovascular risk reduction. Cooking [employer] are taught by a Armed forces logistics/support/administrative officer (RD) who has been trained by the [employer]. The chef or RD has a clear understanding of the importance of minimizing - if not completely eliminating - added fat, sugar, and sodium in recipes. Throughout the series of Cooking School Workshop sessions, patients will learn about healthy ingredients and efficient methods of cooking to build confidence in their capability to prepare    Cooking School weekly topics:  Adding Flavor- Sodium-Free  Fast and Healthy Breakfasts  Powerhouse Plant-Based Proteins  Satisfying Salads and Dressings  Simple Sides and Sauces  International Cuisine-Spotlight on the [employer]  Zones  Delicious Desserts  Savory Soups  [employer] - Meals in a Astronomer Appetizers and Snacks  Comforting Weekend Breakfasts  One-Pot Wonders   Fast Evening Meals  Landscape architect Your Pritikin Plate  WORKSHOPS   Healthy Mindset (Psychosocial):  Focused Goals, Sustainable Changes Clinical staff led group instruction and group discussion with PowerPoint presentation and patient guidebook. To enhance the learning environment the use of posters, models and videos may be added. Patients will be able to apply effective goal setting strategies to establish at least one personal goal, and then take consistent, meaningful action toward that goal. They will learn to identify common barriers to achieving personal goals and develop strategies to overcome them. Patients will also gain an understanding of how our mind-set can impact our ability to achieve goals and the importance of cultivating a positive and growth-oriented mind-set. The purpose of this lesson is to provide patients with a deeper understanding of how to set and achieve personal goals, as well as the tools and strategies needed to overcome common obstacles which may arise along the way.  From Head to Heart: The Power of a Healthy Outlook  Clinical staff led group instruction and group discussion with PowerPoint presentation and patient guidebook. To enhance the learning environment the use of posters, models and videos may be added. Patients will be able to recognize and describe the impact of emotions and mood on physical health. They will discover the importance of self-care and explore self-care practices which may work for them. Patients will also learn how to utilize the 4 C's to cultivate a healthier outlook and better manage stress and challenges. The purpose of this lesson is to demonstrate to patients how a healthy outlook is an essential part of maintaining good health, especially as they continue their  cardiac rehab journey.  Healthy Sleep for a Healthy Heart Clinical staff led group instruction and group discussion with PowerPoint presentation and patient guidebook. To enhance the learning environment the use of posters, models and videos may be added. At the conclusion of this workshop, patients will be able to demonstrate knowledge of the importance of sleep to overall health, well-being, and quality of life. They will understand the symptoms of, and treatments for, common sleep disorders. Patients will also be able to identify daytime and nighttime behaviors which impact sleep, and they will be able to apply these tools to help manage sleep-related challenges. The purpose of this lesson is to provide patients with a general overview of sleep and outline the importance of quality sleep. Patients will learn about a few of the most common sleep disorders. Patients will also be introduced to the concept of "sleep hygiene," and discover ways to self-manage certain sleeping problems through simple daily behavior changes.  Finally, the workshop will motivate patients by clarifying the links between quality sleep and their goals of heart-healthy living.   Recognizing and Reducing Stress Clinical staff led group instruction and group discussion with PowerPoint presentation and patient guidebook. To enhance the learning environment the use of posters, models and videos may be added. At the conclusion of this workshop, patients will be able to understand the types of stress reactions, differentiate between acute and chronic stress, and recognize the impact that chronic stress has on their health. They will also be able to apply different coping mechanisms, such as reframing negative self-talk. Patients will have the opportunity to practice a variety of stress management techniques, such as deep abdominal breathing, progressive muscle relaxation, and/or guided imagery.  The purpose of this lesson is to educate  patients on the role of stress in their lives and to provide healthy techniques for coping with it.  Learning Barriers/Preferences:  Learning Barriers/Preferences - 11/16/22 3086       Learning Barriers/Preferences   Learning Barriers Sight   reading glasses   Learning Preferences Audio;Computer/Internet;Group Instruction;Individual Instruction;Pictoral;Skilled Demonstration;Verbal Instruction;Written Material;Video             Education Topics:  Knowledge Questionnaire Score:  Knowledge Questionnaire Score - 11/16/22 0923       Knowledge Questionnaire Score   Pre Score 21/28             Core Components/Risk Factors/Patient Goals at Admission:  Personal Goals and Risk Factors at Admission - 11/16/22 0802       Core Components/Risk Factors/Patient Goals on Admission    Weight Management Yes    Intervention Weight Management: Provide education and appropriate resources to help participant work on and attain dietary goals.;Weight Management: Develop a combined nutrition and exercise program designed to reach desired caloric intake, while maintaining appropriate intake of nutrient and fiber, sodium and fats, and appropriate energy expenditure required for the weight goal.    Expected Outcomes Short Term: Continue to assess and modify interventions until short term weight is achieved;Long Term: Adherence to nutrition and physical activity/exercise program aimed toward attainment of established weight goal;Understanding recommendations for meals to include 15-35% energy as protein, 25-35% energy from fat, 35-60% energy from carbohydrates, less than 200mg  of dietary cholesterol, 20-35 gm of total fiber daily;Understanding of distribution of calorie intake throughout the day with the consumption of 4-5 meals/snacks    Tobacco Cessation Yes    Number of packs per day 1-2    Intervention Assist the participant in steps to quit. Provide individualized education and counseling about  committing to Tobacco Cessation, relapse prevention, and pharmacological support that can be provided by physician.;Education officer, environmental, assist with locating and accessing local/national Quit Smoking programs, and support quit date choice.    Expected Outcomes Short Term: Will demonstrate readiness to quit, by selecting a quit date.;Long Term: Complete abstinence from all tobacco products for at least 12 months from quit date.;Short Term: Will quit all tobacco product use, adhering to prevention of relapse plan.    Diabetes Yes    Intervention Provide education about proper nutrition, including hydration, and aerobic/resistive exercise prescription along with prescribed medications to achieve blood glucose in normal ranges: Fasting glucose 65-99 mg/dL;Provide education about signs/symptoms and action to take for hypo/hyperglycemia.    Expected Outcomes Short Term: Participant verbalizes understanding of the signs/symptoms and immediate care of hyper/hypoglycemia, proper foot care and importance of medication, aerobic/resistive exercise and nutrition plan for blood glucose control.;Long Term: Attainment of HbA1C <  7%.    Heart Failure Yes    Intervention Provide a combined exercise and nutrition program that is supplemented with education, support and counseling about heart failure. Directed toward relieving symptoms such as shortness of breath, decreased exercise tolerance, and extremity edema.    Expected Outcomes Improve functional capacity of life;Short term: Attendance in program 2-3 days a week with increased exercise capacity. Reported lower sodium intake. Reported increased fruit and vegetable intake. Reports medication compliance.;Short term: Daily weights obtained and reported for increase. Utilizing diuretic protocols set by physician.;Long term: Adoption of self-care skills and reduction of barriers for early signs and symptoms recognition and intervention leading to self-care  maintenance.    Hypertension Yes    Intervention Provide education on lifestyle modifcations including regular physical activity/exercise, weight management, moderate sodium restriction and increased consumption of fresh fruit, vegetables, and low fat dairy, alcohol moderation, and smoking cessation.;Monitor prescription use compliance.    Expected Outcomes Short Term: Continued assessment and intervention until BP is < 140/79mm HG in hypertensive participants. < 130/18mm HG in hypertensive participants with diabetes, heart failure or chronic kidney disease.;Long Term: Maintenance of blood pressure at goal levels.    Lipids Yes    Intervention Provide education and support for participant on nutrition & aerobic/resistive exercise along with prescribed medications to achieve LDL 70mg , HDL >40mg .    Expected Outcomes Short Term: Participant states understanding of desired cholesterol values and is compliant with medications prescribed. Participant is following exercise prescription and nutrition guidelines.;Long Term: Cholesterol controlled with medications as prescribed, with individualized exercise RX and with personalized nutrition plan. Value goals: LDL < 70mg , HDL > 40 mg.    Stress Yes    Intervention Offer individual and/or small group education and counseling on adjustment to heart disease, stress management and health-related lifestyle change. Teach and support self-help strategies.;Refer participants experiencing significant psychosocial distress to appropriate mental health specialists for further evaluation and treatment. When possible, include family members and significant others in education/counseling sessions.    Expected Outcomes Short Term: Participant demonstrates changes in health-related behavior, relaxation and other stress management skills, ability to obtain effective social support, and compliance with psychotropic medications if prescribed.;Long Term: Emotional wellbeing is  indicated by absence of clinically significant psychosocial distress or social isolation.             Core Components/Risk Factors/Patient Goals Review:   Goals and Risk Factor Review     Row Name 12/02/22 1509             Core Components/Risk Factors/Patient Goals Review   Personal Goals Review Weight Management/Obesity;Lipids;Diabetes;Tobacco Cessation;Heart Failure;Hypertension;Stress       Review Demia is off to a good start to exercise. Glorianne reports feeling lightheaded at times at home. orthostatic bp's checked. Systolic bP's in the 90's. heart failure clinic notofied about BP's. CBG's WNL. Encourage smoking cessation       Expected Outcomes Tamaira will continue to participate in cardiac rehab for exercise, nutrition and lifestyle modifications                Core Components/Risk Factors/Patient Goals at Discharge (Final Review):   Goals and Risk Factor Review - 12/02/22 1509       Core Components/Risk Factors/Patient Goals Review   Personal Goals Review Weight Management/Obesity;Lipids;Diabetes;Tobacco Cessation;Heart Failure;Hypertension;Stress    Review Shanekqua is off to a good start to exercise. Marcia reports feeling lightheaded at times at home. orthostatic bp's checked. Systolic bP's in the 90's. heart failure clinic notofied about BP's.  CBG's WNL. Encourage smoking cessation    Expected Outcomes Lerline will continue to participate in cardiac rehab for exercise, nutrition and lifestyle modifications             ITP Comments:  ITP Comments     Row Name 11/16/22 0801 12/02/22 1501         ITP Comments Dr. Armanda Magic medical director. Introduction to pritikin education/intensive cardiac rehab. Initial orientation packet reviewed with patient. 30 Day ITP Review. Dalilah started exercise on 11/23/22. Nimsi reports having pain in bilateral toenails. Shareefah has an appointment with the podiatrist next week and plans to hold exercise until after follow up.                Comments: See ITP Comments

## 2022-12-07 ENCOUNTER — Encounter (HOSPITAL_COMMUNITY): Payer: Medicaid Other

## 2022-12-07 ENCOUNTER — Ambulatory Visit (HOSPITAL_COMMUNITY): Payer: Medicaid Other

## 2022-12-07 ENCOUNTER — Ambulatory Visit: Payer: Medicaid Other | Admitting: Podiatry

## 2022-12-07 ENCOUNTER — Other Ambulatory Visit: Payer: Self-pay

## 2022-12-07 ENCOUNTER — Other Ambulatory Visit (HOSPITAL_COMMUNITY): Payer: Self-pay

## 2022-12-09 ENCOUNTER — Encounter (HOSPITAL_COMMUNITY): Admission: RE | Admit: 2022-12-09 | Payer: Medicaid Other | Source: Ambulatory Visit

## 2022-12-09 ENCOUNTER — Ambulatory Visit (HOSPITAL_COMMUNITY): Payer: Medicaid Other

## 2022-12-09 DIAGNOSIS — I5022 Chronic systolic (congestive) heart failure: Secondary | ICD-10-CM

## 2022-12-09 LAB — GLUCOSE, CAPILLARY
Glucose-Capillary: 82 mg/dL (ref 70–99)
Glucose-Capillary: 109 mg/dL — ABNORMAL HIGH (ref 70–99)

## 2022-12-14 ENCOUNTER — Encounter (HOSPITAL_COMMUNITY): Admission: RE | Admit: 2022-12-14 | Payer: Medicaid Other | Source: Ambulatory Visit

## 2022-12-14 ENCOUNTER — Ambulatory Visit (HOSPITAL_COMMUNITY): Payer: Medicaid Other

## 2022-12-15 ENCOUNTER — Ambulatory Visit (HOSPITAL_BASED_OUTPATIENT_CLINIC_OR_DEPARTMENT_OTHER)
Admission: RE | Admit: 2022-12-15 | Discharge: 2022-12-15 | Disposition: A | Discharge status: Home / Self Care | Payer: Medicaid Other | Source: Ambulatory Visit | Attending: Cardiology | Admitting: Cardiology

## 2022-12-15 ENCOUNTER — Encounter (HOSPITAL_COMMUNITY): Payer: Self-pay | Admitting: Cardiology

## 2022-12-15 ENCOUNTER — Ambulatory Visit (HOSPITAL_COMMUNITY)
Admission: RE | Admit: 2022-12-15 | Discharge: 2022-12-15 | Disposition: A | Discharge status: Home / Self Care | Payer: Medicaid Other | Source: Ambulatory Visit | Attending: Cardiology | Admitting: Cardiology

## 2022-12-15 VITALS — BP 94/60 | HR 83 | Wt 148.0 lb

## 2022-12-15 DIAGNOSIS — I5022 Chronic systolic (congestive) heart failure: Secondary | ICD-10-CM

## 2022-12-15 DIAGNOSIS — I447 Left bundle-branch block, unspecified: Secondary | ICD-10-CM | POA: Diagnosis not present

## 2022-12-15 DIAGNOSIS — I110 Hypertensive heart disease with heart failure: Secondary | ICD-10-CM | POA: Insufficient documentation

## 2022-12-15 DIAGNOSIS — I428 Other cardiomyopathies: Secondary | ICD-10-CM | POA: Diagnosis not present

## 2022-12-15 DIAGNOSIS — E119 Type 2 diabetes mellitus without complications: Secondary | ICD-10-CM | POA: Insufficient documentation

## 2022-12-15 DIAGNOSIS — F17210 Nicotine dependence, cigarettes, uncomplicated: Secondary | ICD-10-CM | POA: Insufficient documentation

## 2022-12-15 LAB — ECHOCARDIOGRAM COMPLETE
AR max vel: 2.17 cm2
AV Area VTI: 2.11 cm2
AV Area mean vel: 1.96 cm2
AV Mean grad: 4 mmHg
AV Peak grad: 6.6 mmHg
Ao pk vel: 1.28 m/s
Calc EF: 19.3 %
Est EF: 25
S' Lateral: 5.2 cm
Single Plane A2C EF: 21.8 %
Single Plane A4C EF: 12.5 %

## 2022-12-15 LAB — BASIC METABOLIC PANEL
Anion gap: 8 (ref 5–15)
BUN: 13 mg/dL (ref 6–20)
CO2: 28 mmol/L (ref 22–32)
Calcium: 9.7 mg/dL (ref 8.9–10.3)
Chloride: 100 mmol/L (ref 98–111)
Creatinine, Ser: 1.1 mg/dL — ABNORMAL HIGH (ref 0.44–1.00)
GFR, Estimated: 59 mL/min — ABNORMAL LOW (ref >60)
Glucose, Bld: 104 mg/dL — ABNORMAL HIGH (ref 70–99)
Potassium: 4 mmol/L (ref 3.5–5.1)
Sodium: 136 mmol/L (ref 135–145)

## 2022-12-15 LAB — CBC
HCT: 39.5 % (ref 36.0–46.0)
Hemoglobin: 12.4 g/dL (ref 12.0–15.0)
MCH: 26.8 pg (ref 26.0–34.0)
MCHC: 31.4 g/dL (ref 30.0–36.0)
MCV: 85.3 fL (ref 80.0–100.0)
Platelets: 151 10*3/uL (ref 150–400)
RBC: 4.63 MIL/uL (ref 3.87–5.11)
RDW: 18.8 % — ABNORMAL HIGH (ref 11.5–15.5)
WBC: 7 10*3/uL (ref 4.0–10.5)
nRBC: 0 % (ref 0.0–0.2)

## 2022-12-15 LAB — BRAIN NATRIURETIC PEPTIDE: B Natriuretic Peptide: 50.2 pg/mL (ref 0.0–100.0)

## 2022-12-15 MED ORDER — PERFLUTREN LIPID MICROSPHERE
1.0000 mL | INTRAVENOUS | Status: DC | PRN
  Administered 2022-12-15: 4 mL via INTRAVENOUS

## 2022-12-15 NOTE — Progress Notes (Signed)
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  Order form completed and both shipped by Fed[employer] to [facility]. 

## 2022-12-15 NOTE — Patient Instructions (Signed)
Medication Changes:  No Changes In Medications at this time.   Lab Work:  Labs done today, your results will be available in MyChart, we will contact you for abnormal readings.  Testing/Procedures:  Genetic test has been done, this has to be sent to [place] to be processed and can take 1-2 weeks to get results back.  We will let you know the results.  Referrals:  YOU HAVE BEEN REFERRED TO ELECTROPHYSIOLOGY THEY WILL REACH OUT TO YOU OR CALL TO ARRANGE THIS. PLEASE CALL us WITH ANY CONCERNS   Follow-Up in: 6 WEEKS WITH APP   At [facility], you and your health needs are our priority. We have a designated team specialized in the treatment of Heart Failure. This Care Team includes your primary Heart Failure Specialized Cardiologist (physician), Advanced Practice Providers (APPs- Physician Assistants and Nurse Practitioners), and Pharmacist who all work together to provide you with the care you need, when you need it.   You may see any of the following providers on your designated Care Team at your next follow up:  Dr. Arvilla Meres Dr. Marca Ancona Dr. Marcos Eke, NP Robbie Lis, [place] [facility] [place], [place] Brynda Peon, NP Karle Plumber, PharmD   Please be sure to bring in all your medications bottles to every appointment.   Need to Contact us:  If you have any questions or concerns before your next appointment please send [place] a message through [place] or call our office at [phone].    TO LEAVE A MESSAGE FOR THE NURSE SELECT OPTION 2, PLEASE LEAVE A MESSAGE INCLUDING: YOUR NAME DATE OF BIRTH CALL BACK NUMBER REASON FOR CALL**this is important as we prioritize the call backs  YOU WILL RECEIVE A CALL BACK THE SAME DAY AS LONG AS YOU CALL BEFORE 4:00 PM

## 2022-12-16 ENCOUNTER — Encounter (HOSPITAL_COMMUNITY): Payer: Medicaid Other

## 2022-12-16 ENCOUNTER — Ambulatory Visit (HOSPITAL_COMMUNITY): Payer: Medicaid Other

## 2022-12-18 NOTE — Progress Notes (Signed)
Advanced Heart Failure Clinic Note    PCP: Claiborne Rigg, NP HF Cardiologist: Dr. Shirlee Latch  57 y.o. AAF with past medical history of HTN, DM, tobacco use, anxiety, bipolar disorder, a remote history of ETOH abuse, and diagnosis of systolic heart failure and LE DVT in 5/24.   Admitted 5/24 with new acute systolic heart failure. Echo showed EF <20%, GIIIDD, RV mod reduced, mod elevated PASP ~58, LA mod dilated, RA mildly dilated, mild MR, mod TR. Hs trops negative. She was diuresed w/ IV Lasix and started on GDMT. Heparin started for LE DVT. V/Q scan showed no perfusion defects to suggest PE. Hypercoagulable workup was negative. R/LHC showed normal coronary arteries, elevated filling pressures with low cardiac output (RA 6, PA 47/24/33, PA sat 57%, Fick CI 2.1). She was diuresed further w/ IV Lasix and transitioned to PO torsemide. GDMT further titrated. Switched to Eliquis after completion of cath.   Cardiac MRI in 8/24 showed LV EF 25%, RV EF 36%, no delayed enhancement.    Echo was done today and reviewed, EF 25% with septal-lateral dyssynchrony, mild RV dysfunction, and IVC normal.   Today she returns for HF follow up. She has episodes of lightheadedness if she bends over then stands up or stands up too fast.  No falls or syncope.  Rare palpitations.  No significant exertional dyspnea.  No problems walking up stairs.  Able to ride exercise bike without problems. Still smoking 1 ppd. Weight stable. No ETOH or drug use. Doing cardiac rehab.   ECG (personally reviewed): NSR, LBBB (reported as 128 msec but looks wider).   Labs (7/24): K 4.6, creatinine 1.07 => 1.09, BNP 77  PMH: 1. DVT: DVT in 1980.  Left lower leg DVT in 5/24.  - Prothrombin gene mutation negative, factor V Leiden negative, lupus anticoagulant negative 2. Type 2 diabetes 3. GERD 4. HTN 5. Sickle cell trait 6. Active smoker 7. Prior ETOH abuse.  8. Bipolar disorder 9. Chronic systolic CHF: Nonischemic cardiomyopathy.   Diagnosis by echo in 5/24 showing EF < 20%, grade III DD, RV moderately reduced, RVSP 58 mmHg, mild MR, moderate TR.  - R/LHC (5/24): normal coronaries; RA 6, PA 47/24 (33), PCWP 23, CO/CI (Fick) 3.4/2.1, PVR 2.6 WU.  - Cardiac MRI (8/24): LV EF 25%, RV EF 36%, no delayed enhancement.   - Echo (9/24):  EF 25% with septal-lateral dyssynchrony, mild RV dysfunction, and IVC normal.   FH: Mother with CHF, ? History of rheumatic heart disease.  Sister with CHF.   Current Outpatient Medications  Medication Sig Dispense Refill   acetaminophen-codeine (TYLENOL #3) 300-30 MG tablet Take 1-2 tablets by mouth every 4 (four) hours as needed for moderate pain. 60 tablet 0   albuterol (VENTOLIN HFA) 108 (90 Base) MCG/ACT inhaler Inhale 2 puffs into the lungs every 6 (six) hours as needed for wheezing or shortness of breath. 18 g 0   amitriptyline (ELAVIL) 75 MG tablet Take 1 tablet (75 mg total) by mouth at bedtime for pain and depression 90 tablet 1   apixaban (ELIQUIS) 5 MG TABS tablet Take 1 tablet (5 mg total) by mouth 2 (two) times daily. 60 tablet 6   Biotin 5 MG CAPS Take 5 mg by mouth 3 (three) times daily.     Blood Pressure Monitor DEVI Please provide patient with insurance approved blood pressure monitor 1 each 0   cholecalciferol (VITAMIN D3) 25 MCG (1000 UT) tablet Take 1,000 Units by mouth daily.  dapagliflozin propanediol (FARXIGA) 10 MG TABS tablet Take 1 tablet (10 mg total) by mouth daily. 30 tablet 6   famotidine (PEPCID) 20 MG tablet Take 1 tablet (20 mg total) by mouth 2 (two) times daily for heartburn 180 tablet 1   gabapentin (NEURONTIN) 300 MG capsule Take 1 capsule (300 mg total) by mouth 2 (two) times daily. 180 capsule 3   hydrOXYzine (ATARAX) 50 MG tablet Take 1 tablet (50 mg total) by mouth 3 (three) times daily as needed. Please deliver 90 tablet 2   Misc. Devices MISC Please provide patient with incontinence pads or disposable underwear or choice. N39.3 1 each PRN    omeprazole (PRILOSEC) 20 MG capsule Take 1 capsule (20 mg total) by mouth daily for heartburn 90 capsule 1   potassium chloride SA (KLOR-CON M) 20 MEQ tablet Take 1 tablet (20 mEq total) by mouth daily. 30 tablet 3   rosuvastatin (CRESTOR) 5 MG tablet Take 1 tablet (5 mg total) by mouth daily for cholesterol 90 tablet 1   sacubitril-valsartan (ENTRESTO) 24-26 MG Take 1 tablet by mouth 2 (two) times daily. 60 tablet 3   spironolactone (ALDACTONE) 25 MG tablet Take 1 tablet (25 mg total) by mouth at bedtime. 30 tablet 3   torsemide (DEMADEX) 20 MG tablet Take 1 tablet (20 mg total) by mouth daily. 30 tablet 3   vitamin B-12 (CYANOCOBALAMIN) 500 MCG tablet Take 500 mcg by mouth daily.     vitamin C (ASCORBIC ACID) 500 MG tablet Take 500 mg by mouth daily.     No current facility-administered medications for this encounter.   Allergies  Allergen Reactions   Amoxicillin     Pain Did it involve swelling of the face/tongue/throat, SOB, or low BP? No Did it involve sudden or severe rash/hives, skin peeling, or any reaction on the inside of your mouth or nose? No Did you need to seek medical attention at a hospital or doctor's office? Yes When did it last happen?      5 years ago If all above answers are "NO", may proceed with cephalosporin use.    Darvocet [Propoxyphene N-Acetaminophen] Nausea And Vomiting    Makes her jittery   Hydrocodone-Acetaminophen Nausea And Vomiting    Upset stomach   Percocet [Oxycodone-Acetaminophen]     Makes her jittery   Valium [Diazepam] Other (See Comments)    Makes pt. Feel "out of wack"   Social History   Socioeconomic History   Marital status: Single    Spouse name: Not on file   Number of children: 3   Years of education: 8-9   Highest education level: 8th grade  Occupational History   Occupation: applying for disability  Tobacco Use   Smoking status: Every Day    Current packs/day: 2.00    Average packs/day: 2.0 packs/day for 29.0 years (58.0  ttl pk-yrs)    Types: Cigarettes   Smokeless tobacco: Never  Vaping Use   Vaping status: Never Used  Substance and Sexual Activity   Alcohol use: No    Alcohol/week: 0.0 standard drinks of alcohol    Comment: Stopped drinking 2013   Drug use: No   Sexual activity: Not Currently    Birth control/protection: None  Other Topics Concern   Not on file  Social History Narrative   Patient lives alone in an apartment on the first floor.  3 children.  Applying for disability.  Education: 8th or 9th grade.   Currently not working   Trying to  go back to school online for GED   Six Grandchildren   Social Determinants of Health   Financial Resource Strain: Medium Risk (09/02/2022)   Overall Financial Resource Strain (CARDIA)    Difficulty of Paying Living Expenses: Somewhat hard  Food Insecurity: Food Insecurity Present (10/26/2022)   Hunger Vital Sign    Worried About Running Out of Food in the Last Year: Sometimes true    Ran Out of Food in the Last Year: Sometimes true  Transportation Needs: Unmet Transportation Needs (11/25/2022)   PRAPARE - Administrator, Civil Service (Medical): No    Lack of Transportation (Non-Medical): Yes  Physical Activity: Insufficiently Active (09/02/2022)   Exercise Vital Sign    Days of Exercise per Week: 1 day    Minutes of Exercise per Session: 30 min  Stress: Stress Concern Present (09/02/2022)   [employer] of Occupational Health - Occupational Stress Questionnaire    Feeling of Stress : To some extent  Social Connections: Moderately Isolated (09/02/2022)   Social Connection and Isolation Panel [NHANES]    Frequency of Communication with Friends and Family: Three times a week    Frequency of Social Gatherings with Friends and Family: Once a week    Attends Religious Services: Never    Database administrator or Organizations: Yes    Attends Banker Meetings: 1 to 4 times per year    Marital Status: Divorced  Intimate  Partner Violence: Unknown (06/08/2017)   Humiliation, Afraid, Rape, and Kick questionnaire    Fear of Current or Ex-Partner: Patient declined    Emotionally Abused: Patient declined    Physically Abused: Patient declined    Sexually Abused: Patient declined    Wt Readings from Last 3 Encounters:  12/15/22 67.1 kg (148 lb)  11/16/22 67.5 kg (148 lb 13 oz)  10/26/22 66.4 kg (146 lb 6.4 oz)   BP 94/60   Pulse 83   Wt 67.1 kg (148 lb)   LMP 03/12/2019 (Approximate)   SpO2 100%   BMI 24.63 kg/m   PHYSICAL EXAM: General: NAD Neck: No JVD, no thyromegaly or thyroid nodule.  Lungs: Clear to auscultation bilaterally with normal respiratory effort. CV: Nondisplaced PMI.  Heart regular S1/S2, no S3/S4, no murmur.  No peripheral edema.  No carotid bruit.  Normal pedal pulses.  Abdomen: Soft, nontender, no hepatosplenomegaly, no distention.  Skin: Intact without lesions or rashes.  Neurologic: Alert and oriented x 3.  Psych: Normal affect. Extremities: No clubbing or cyanosis.  HEENT: Normal.   ASSESSMENT & PLAN: 1. Chronic Systolic CHF:  Nonischemic cardiomyopathy.  Echo 5/24 with EF <20%, GIIIDD, RV mod reduced, mod elevated PASP ~58, LA mod dilated, RA mildly dilated, mild MR, mod TR. NICM. R/LHC showed normal coronaries, elevated filling pressures and low output, CI 2.1.  Cardiac MRI in 8/24 showed no delayed enhancement.  Echo was done again today and reviewed, EF 25% with septal-lateral dyssynchrony, mild RV dysfunction, and IVC normal.  Etiology for cardiomyopathy uncertain. Previous history of heavy alcohol use but quit a number of years ago. HIV negative.  No viral-type illness prior to diagnosis. She has a FH of CHF (sister and mother). NYHA class II, not volume overloaded on exam.  I will not increase meds today given significant orthostatic symptoms at times.  - Continue spironolactone 25 mg daily.  - Continue torsemide 20 mg daily + 20 KCL daily.  BMET/BNP today.  - Continue  Farxiga 10 mg daily - Continue Entresto  24/26 mg bid. No BP room to increase.  - No BP room for beta blocker with SBP 90 and some orthostatic symptoms. - I will send [facility] gene testing to screen for familial cardiomyopathy.  - I will refer to EP for evaluation for CRT-D with persistently low EF.  She has LBBB, machine reads 128 msec but looks wider to me.  She also has septal-lateral dyssynchrony on echo.  I think that CRT-D would be reasonable here.  2. HTN: BP now running on the low side.  3. DMII: Continue SGLT2i. 4. Tobacco Abuse: Smoking 1 ppd, down from 2 ppd. Has smoked since she was 50. - She will try nicotine patches.  5. DVT: Left leg DVT found 5/24.  Had DVT back in 1980s after she delivered her son. Hypercoagulable workup negative.  - Long-term Eliquis given unprovoked DVT. CBC today.  6. Fe deficiency anemia: given IV Fe during recent hospitalization. Will need this followed by PCP. - On Eliquis for DVT.   Followup in 6 wks with APP.   Marca Ancona, MD 12/18/22

## 2022-12-19 ENCOUNTER — Ambulatory Visit (HOSPITAL_COMMUNITY): Payer: Medicaid Other

## 2022-12-19 ENCOUNTER — Encounter (HOSPITAL_COMMUNITY): Payer: Medicaid Other

## 2022-12-21 ENCOUNTER — Ambulatory Visit (HOSPITAL_COMMUNITY): Payer: Medicaid Other

## 2022-12-21 ENCOUNTER — Encounter (HOSPITAL_COMMUNITY): Payer: Medicaid Other

## 2022-12-21 ENCOUNTER — Ambulatory Visit (INDEPENDENT_AMBULATORY_CARE_PROVIDER_SITE_OTHER): Payer: Medicaid Other | Admitting: Podiatry

## 2022-12-21 DIAGNOSIS — D492 Neoplasm of unspecified behavior of bone, soft tissue, and skin: Secondary | ICD-10-CM

## 2022-12-21 NOTE — Progress Notes (Signed)
Subjective:  Patient ID: Kendra Griffith, female    DOB: 08/28/1965,  MRN: [id number]  Chief Complaint  Patient presents with   Nail Problem    Painful nail     57 y.o. female presents with the above complaint.  Patient presents for bilateral fifth porokeratotic lesion painful to touch is progressive gotten worse worse with ambulation worse with pressure.  Patient has a history of floating osteotomy.  She has not seen anyone else prior to seeing me denies any other acute complaints.  She would like to have them debrided down they are not as painful as it used to be   Review of Systems: Negative except as noted in the HPI. Denies N/V/F/Ch.  Past Medical History:  Diagnosis Date   ABSCESS, TOOTH 08/20/2009   Qualifier: Diagnosis of  By: Huntley Dec, Scott     Anemia    Anxiety    Bipolar disorder (HCC)    Blood transfusion without reported diagnosis    childbirth   Chronic pain    feet and back   Clotting disorder (HCC)    childbirth   Dental caries    lesion soft palate   Depression    Diabetes mellitus without complication (HCC)    prediabetes   GERD (gastroesophageal reflux disease)    Hypertension    Injury    right side from jumping off bunkbed   Pre-diabetes    Sickle cell trait (HCC)    Wears glasses     Current Outpatient Medications:    CARESTART COVID-19 HOME TEST KIT, See admin instructions., Disp: , Rfl:    acetaminophen-codeine (TYLENOL #3) 300-30 MG tablet, Take 1-2 tablets by mouth every 4 (four) hours as needed for moderate pain., Disp: 60 tablet, Rfl: 0   albuterol (VENTOLIN HFA) 108 (90 Base) MCG/ACT inhaler, Inhale 2 puffs into the lungs every 6 (six) hours as needed for wheezing or shortness of breath., Disp: 18 g, Rfl: 0   amitriptyline (ELAVIL) 75 MG tablet, Take 1 tablet (75 mg total) by mouth at bedtime for pain and depression, Disp: 90 tablet, Rfl: 1   apixaban (ELIQUIS) 5 MG TABS tablet, Take 1 tablet (5 mg total) by mouth 2 (two) times daily.,  Disp: 60 tablet, Rfl: 6   Biotin 5 MG CAPS, Take 5 mg by mouth 3 (three) times daily., Disp: , Rfl:    Blood Pressure Monitor DEVI, Please provide patient with insurance approved blood pressure monitor, Disp: 1 each, Rfl: 0   cholecalciferol (VITAMIN D3) 25 MCG (1000 UT) tablet, Take 1,000 Units by mouth daily., Disp: , Rfl:    dapagliflozin propanediol (FARXIGA) 10 MG TABS tablet, Take 1 tablet (10 mg total) by mouth daily., Disp: 30 tablet, Rfl: 6   famotidine (PEPCID) 20 MG tablet, Take 1 tablet (20 mg total) by mouth 2 (two) times daily for heartburn, Disp: 180 tablet, Rfl: 1   gabapentin (NEURONTIN) 300 MG capsule, Take 1 capsule (300 mg total) by mouth 2 (two) times daily., Disp: 180 capsule, Rfl: 3   hydrOXYzine (ATARAX) 50 MG tablet, Take 1 tablet (50 mg total) by mouth 3 (three) times daily as needed. Please deliver, Disp: 90 tablet, Rfl: 2   Misc. Devices MISC, Please provide patient with incontinence pads or disposable underwear or choice. N39.3, Disp: 1 each, Rfl: PRN   omeprazole (PRILOSEC) 20 MG capsule, Take 1 capsule (20 mg total) by mouth daily for heartburn, Disp: 90 capsule, Rfl: 1   potassium chloride SA (KLOR-CON M) 20  MEQ tablet, Take 1 tablet (20 mEq total) by mouth daily., Disp: 30 tablet, Rfl: 3   rosuvastatin (CRESTOR) 5 MG tablet, Take 1 tablet (5 mg total) by mouth daily for cholesterol, Disp: 90 tablet, Rfl: 1   sacubitril-valsartan (ENTRESTO) 24-26 MG, Take 1 tablet by mouth 2 (two) times daily., Disp: 60 tablet, Rfl: 3   spironolactone (ALDACTONE) 25 MG tablet, Take 1 tablet (25 mg total) by mouth at bedtime., Disp: 30 tablet, Rfl: 3   torsemide (DEMADEX) 20 MG tablet, Take 1 tablet (20 mg total) by mouth daily., Disp: 30 tablet, Rfl: 3   vitamin B-12 (CYANOCOBALAMIN) 500 MCG tablet, Take 500 mcg by mouth daily., Disp: , Rfl:    vitamin C (ASCORBIC ACID) 500 MG tablet, Take 500 mg by mouth daily., Disp: , Rfl:   Social History   Tobacco Use  Smoking Status Every  Day   Current packs/day: 2.00   Average packs/day: 2.0 packs/day for 29.0 years (58.0 ttl pk-yrs)   Types: Cigarettes  Smokeless Tobacco Never    Allergies  Allergen Reactions   Amoxicillin     Pain Did it involve swelling of the face/tongue/throat, SOB, or low BP? No Did it involve sudden or severe rash/hives, skin peeling, or any reaction on the inside of your mouth or nose? No Did you need to seek medical attention at a hospital or doctor's office? Yes When did it last happen?      5 years ago If all above answers are "NO", may proceed with cephalosporin use.    Darvocet [Propoxyphene N-Acetaminophen] Nausea And Vomiting    Makes her jittery   Hydrocodone-Acetaminophen Nausea And Vomiting    Upset stomach   Percocet [Oxycodone-Acetaminophen]     Makes her jittery   Valium [Diazepam] Other (See Comments)    Makes pt. Feel "out of wack"   Objective:  There were no vitals filed for this visit. There is no height or weight on file to calculate BMI. Constitutional Well developed. Well nourished.  Vascular Dorsalis pedis pulses palpable bilaterally. Posterior tibial pulses palpable bilaterally. Capillary refill normal to all digits.  No cyanosis or clubbing noted. Pedal hair growth normal.  Neurologic Normal speech. Oriented to person, place, and time. Epicritic sensation to light touch grossly present bilaterally.  Dermatologic Bilateral fifth metatarsal submet 5 porokeratotic lesion painful to touch.  No pain or bleeding noted.  Orthopedic: Normal joint ROM without pain or crepitus bilaterally. No visible deformities. No bony tenderness.   Radiographs: None Assessment:   1. Skin neoplasm    Plan:  Patient was evaluated and treated and all questions answered.  Bilateral submet 5 porokeratosis/skin neoplasm -Explained to the patient the etiology of porokeratotic lesion painful to touch is progressive and worse.  At this time patient will benefit from debridement of  the lesion and discussed with patient she states understand like to proceed with debridement of the lesion using chisel blade handle the lesion was debrided down to healthy dry tissue.  No complication noted no pinpoint bleeding noted No follow-ups on file.

## 2022-12-23 ENCOUNTER — Encounter (HOSPITAL_COMMUNITY): Payer: Medicaid Other

## 2022-12-23 ENCOUNTER — Ambulatory Visit (HOSPITAL_COMMUNITY): Payer: Medicaid Other

## 2022-12-26 ENCOUNTER — Ambulatory Visit (HOSPITAL_COMMUNITY): Payer: Medicaid Other

## 2022-12-26 ENCOUNTER — Telehealth (HOSPITAL_COMMUNITY): Payer: Self-pay | Admitting: *Deleted

## 2022-12-26 ENCOUNTER — Encounter (HOSPITAL_COMMUNITY): Payer: Medicaid Other

## 2022-12-26 NOTE — Telephone Encounter (Signed)
Kendra Griffith has been absent from cardiac rehab since 12/09/22, initially with toe pain. Called to see when she plans to return. No answer, message left on her voicemail.

## 2022-12-27 NOTE — Addendum Note (Signed)
Encounter addended by: [place], Lenyx Boody G, RD on: 12/27/2022 10:11 AM  Actions taken: Flowsheet data copied forward, Flowsheet accepted

## 2022-12-28 ENCOUNTER — Ambulatory Visit (HOSPITAL_COMMUNITY): Payer: Medicaid Other

## 2022-12-28 ENCOUNTER — Encounter (HOSPITAL_COMMUNITY)
Admission: RE | Admit: 2022-12-28 | Discharge: 2022-12-28 | Disposition: A | Discharge status: Home / Self Care | Payer: Medicaid Other | Source: Ambulatory Visit | Attending: Cardiology | Admitting: Cardiology

## 2022-12-28 DIAGNOSIS — I5022 Chronic systolic (congestive) heart failure: Secondary | ICD-10-CM | POA: Diagnosis present

## 2022-12-28 DIAGNOSIS — I110 Hypertensive heart disease with heart failure: Secondary | ICD-10-CM | POA: Diagnosis not present

## 2022-12-28 NOTE — Progress Notes (Signed)
Rubab returned to cardiac rehab today and exercised without difficulty.Thayer Headings RN BSN

## 2022-12-28 NOTE — Addendum Note (Signed)
Encounter addended by: Artist Pais on: 12/28/2022 3:14 PM  Actions taken: Flowsheet data copied forward, Flowsheet accepted

## 2022-12-29 ENCOUNTER — Encounter: Payer: Medicaid Other | Attending: Psychology

## 2022-12-29 DIAGNOSIS — G894 Chronic pain syndrome: Secondary | ICD-10-CM | POA: Diagnosis present

## 2022-12-29 DIAGNOSIS — F3173 Bipolar disorder, in partial remission, most recent episode manic: Secondary | ICD-10-CM | POA: Insufficient documentation

## 2022-12-29 DIAGNOSIS — F418 Other specified anxiety disorders: Secondary | ICD-10-CM | POA: Insufficient documentation

## 2022-12-29 DIAGNOSIS — R413 Other amnesia: Secondary | ICD-10-CM | POA: Insufficient documentation

## 2022-12-29 DIAGNOSIS — G621 Alcoholic polyneuropathy: Secondary | ICD-10-CM | POA: Insufficient documentation

## 2022-12-29 NOTE — Progress Notes (Addendum)
QUALITY OF LIFE SCORE REVIEW  Pt completed Quality of Life survey as a participant in Cardiac Rehab.  Scores 21.0 or below are considered low.  Pt score very low in several areas Overall 14.57, Health and Function 11.20, socioeconomic 7.5, physiological and spiritual 24.00, family 22.80. Patient quality of life slightly altered by physical constraints which limits ability to perform as prior to recent cardiac illness. Kendra Griffith says her chest pain shortness of breath is getting better. Kendra Griffith says she is not as depressed as when she first started cardiac rehab.Marland Kitchen Kendra Griffith says the antidepressant she is taking is controlling her depression. Kendra Griffith says she is getting counseling from [facility].  Offered emotional support and reassurance.   Kendra Griffith says that her primary care provider is aware of her situation and does not want this review forwarded at this time. Will continue to monitor and intervene as necessary.  Mailed application to [employer] [place] for transportation assistance, formerly [employer].  Thayer Headings RN BSN

## 2022-12-29 NOTE — Progress Notes (Signed)
Behavioral Observations: The patient appeared well-groomed and appropriately dressed. Her manners were polite and appropriate to the situation. The patient's attitude towards testing was positive and her effort was good.   Neuropsychology Note  Kendra Griffith completed 145 minutes of neuropsychological testing with technician, Staci Acosta, BA, under the supervision of Arley Phenix, PsyD., Clinical Neuropsychologist. The patient did not appear overtly distressed by the testing session, per behavioral observation or via self-report to the technician. Rest breaks were offered.   Clinical Decision Making: In considering the patient's current level of functioning, level of presumed impairment, nature of symptoms, emotional and behavioral responses during clinical interview, level of literacy, and observed level of motivation/effort, a battery of tests was selected by Dr. Kieth Brightly during initial consultation on 11/08/2022. This was communicated to the technician. Communication between the neuropsychologist and technician was ongoing throughout the testing session and changes were made as deemed necessary based on patient performance on testing, technician observations and additional pertinent factors such as those listed above.  Tests Administered: Finger Tapping Test (FTT) Grooved Pegboard Wechsler Adult Intelligence Scale, 4th Edition (WAIS-IV) Wechsler Memory Scale, 4th Edition (WMS-IV); Adult Battery  Results:  FTT:  R [facility]) Average= 20.2  Percentile Rank= 28th L ([facility]) Average=27.6  Percentile Rank= 35th  Grooved Pegboard:  R [facility]) time= 146s Percentile Rank=24th L (NDH) time= 204s  Percentile Rank= 12th   WAIS-IV:  Composite Score Summary  Scale Sum of Scaled Scores Composite Score Percentile Rank 95% Conf. Interval Qualitative Description  Verbal Comprehension 17 VCI 76 5 71-83 Borderline  Perceptual Reasoning 16 PRI 73 4 68-81 Borderline  Working Memory 12 WMI 77 6  72-85 Borderline  Processing Speed 11 PSI 76 5 70-87 Borderline  Full Scale 56 FSIQ 71 3 68-76 Borderline  General Ability 33 GAI 72 3 68-78 Borderline   Verbal Comprehension Subtests Summary  Subtest Raw Score Scaled Score Percentile Rank Reference Group Scaled Score SEM  Similarities 16 6 9 5  1.08  Vocabulary 16 5 5 5  0.73  Information 7 6 9 6  0.67  The scaled scores in the Reference Group Scaled Score column are based on the performance of examinees aged 20:0-34:11 (i.e., the reference group). See Chapter 6 of the WAIS-IV Technical and Interpretive Manual for more information.  Perceptual Reasoning Subtests Summary  Subtest Raw Score Scaled Score Percentile Rank Reference Group Scaled Score SEM  Block Design 16 5 5 4  1.04  Matrix Reasoning 7 5 5 3  0.95  Visual Puzzles 7 6 9 5  0.99   Working Librarian, academic Raw Score Scaled Score Percentile Rank Reference Group Scaled Score SEM  Digit Span 21 7 16 6  0.85  Arithmetic 8 5 5 5  1.04   Processing Speed Subtests Summary  Subtest Raw Score Scaled Score Percentile Rank Reference Group Scaled Score SEM  Symbol Search 18 6 9 5  1.31  Coding 34 5 5 4  0.99     WMS-IV:   Index Score Summary  Index Sum of Scaled Scores Index Score Percentile Rank 95% Confidence Interval Qualitative Descriptor  Auditory Memory (AMI) 30 85 16 80-92 Low Average  Visual Memory (VMI) 24 76 5 71-83 Borderline  Visual Working Memory (VWMI) 5 52 0.1 48-63 Extremely Low  Immediate Memory (IMI) 29 81 10 76-88 Low Average  Delayed Memory (DMI) 25 74 4 69-82 Borderline   Primary Subtest Scaled Score Summary  Subtest Domain Raw Score Scaled Score Percentile Rank  Logical Memory I AM 21 8 25   Logical  Memory II AM 16 8 25   Verbal [facility] I AM 22 8 25   Verbal [facility] II AM 5 6 9   Designs I VM 46 6 9  Designs II VM 30 4 2   Visual Reproduction I VM 27 7 16   Visual Reproduction II VM 11 7 16   Spatial Addition VWM 2 3 1    Symbol Span VWM 3 2 0.4     Auditory Memory Process Score Summary  Process Score Raw Score Scaled Score Percentile Rank Cumulative Percentage (Base Rate)  LM II Recognition 23 - - 26-50%  VPA II Recognition 36 - - 17-25%   Visual Memory Process Score Summary  Process Score Raw Score Scaled Score Percentile Rank Cumulative Percentage (Base Rate)  DE I Content 25 5 5  -  DE I Spatial 13 8 25  -  DE II Content 22 5 5  -  DE II Spatial 8 7 16  -  DE II Recognition 12 - - 17-25%  VR II Recognition 1 - - <=2%    ABILITY-MEMORY ANALYSIS  Ability Score:  VCI: 76 Date of Testing:  WAIS-IV; WMS-IV 2022/12/29  Predicted Difference Method   Index Predicted WMS-IV Index Score Actual WMS-IV Index Score Difference Critical Value  Significant Difference Y/N Base Rate  Auditory Memory 88 85 3 9.00 N   Visual Memory 89 76 13 8.38 Y 15-20%  Visual Working Memory 87 52 35 10.86 Y <1%  Immediate Memory 86 81 5 10.12 N   Delayed Memory 88 74 14 9.95 Y 15%  Statistical significance (critical value) at the .01 level.    Feedback to Patient: Kendra Griffith will return on 08/24/2023 for an interactive feedback session with Dr. Kieth Brightly at which time her test performances, clinical impressions and treatment recommendations will be reviewed in detail. The patient understands she can contact our office should she require our assistance before this time.  145 minutes spent face-to-face with patient administering standardized tests, 30 minutes spent scoring Radiographer, therapeutic). [CPT [id number], [id number]]  Full report to follow.

## 2022-12-30 ENCOUNTER — Encounter (HOSPITAL_COMMUNITY)
Admission: RE | Admit: 2022-12-30 | Discharge: 2022-12-30 | Disposition: A | Discharge status: Home / Self Care | Payer: Medicaid Other | Source: Ambulatory Visit | Attending: Cardiology

## 2022-12-30 ENCOUNTER — Ambulatory Visit (HOSPITAL_COMMUNITY): Payer: Medicaid Other

## 2022-12-30 DIAGNOSIS — I110 Hypertensive heart disease with heart failure: Secondary | ICD-10-CM | POA: Diagnosis not present

## 2022-12-30 DIAGNOSIS — I5022 Chronic systolic (congestive) heart failure: Secondary | ICD-10-CM

## 2022-12-30 NOTE — Progress Notes (Signed)
Reviewed home exercise guidelines with Verlon Au including endpoints, temperature precautions, target heart rate and rate of perceived exertion. She is currently stretching, floor exercise, aerobic exercise, and dancing using YouTube videos as her mode of home exercise. She has 2 lb weights that she can use for her resistance training. She also has an elliptical machine, which she has been able to use lately because of foot pain. She would like to incorporate more outdoor walking into her routine. Her goal is stay motivated to exercise and to quit smoking. Information given on the virtual tobacco cessation classes. Shanetta voices understanding of instructions given.  Artist Pais, MS, ACSM CEP

## 2023-01-02 ENCOUNTER — Telehealth (HOSPITAL_COMMUNITY): Payer: Self-pay | Admitting: *Deleted

## 2023-01-02 ENCOUNTER — Ambulatory Visit (HOSPITAL_COMMUNITY): Payer: Medicaid Other

## 2023-01-02 ENCOUNTER — Encounter (HOSPITAL_COMMUNITY)
Admission: RE | Admit: 2023-01-02 | Discharge: 2023-01-02 | Disposition: A | Discharge status: Home / Self Care | Payer: Medicaid Other | Source: Ambulatory Visit | Attending: Cardiology | Admitting: Cardiology

## 2023-01-02 DIAGNOSIS — I110 Hypertensive heart disease with heart failure: Secondary | ICD-10-CM | POA: Diagnosis not present

## 2023-01-02 DIAGNOSIS — I5022 Chronic systolic (congestive) heart failure: Secondary | ICD-10-CM

## 2023-01-02 NOTE — Telephone Encounter (Signed)
Byrd Hesselbach called to report pt's BP has been running a little low and pt c/o lightheadedness at times. At rehab today was 91/51 dropped to 86/60 and the was up to 100/69 when pt left. Pt states she checks BP at home, ~SBP 90s. Pt compliant with meds: Torsemide 20 mg daily, spiro 25 mg at bedtime, farxiga 10 daily, will forward to provider for further recommendations.

## 2023-01-02 NOTE — Progress Notes (Signed)
Initial   BP 91/51 heart rate 91. Patient reported feeling lightheaded coming to exercise on the elevator.. Given water. Repeat blood pressure 99/54. Onsite provider Carlos Levering DNP notified about symptoms. Repeat  BP on the nustep 86/60 manual BP. 142/82. Auto BP. Exit blood pressure 110/64. The heart failure clinic was called and notified about continued low BP's at cardiac rehab spoke with Meredith Staggers RN. Will continue to monitor the patient throughout  the program. Thayer Headings RN BSN

## 2023-01-03 ENCOUNTER — Other Ambulatory Visit: Payer: Self-pay

## 2023-01-03 ENCOUNTER — Ambulatory Visit: Payer: Medicaid Other | Attending: Nurse Practitioner | Admitting: Nurse Practitioner

## 2023-01-03 ENCOUNTER — Encounter: Payer: Self-pay | Admitting: Nurse Practitioner

## 2023-01-03 ENCOUNTER — Other Ambulatory Visit (HOSPITAL_COMMUNITY): Payer: Self-pay

## 2023-01-03 VITALS — BP 86/56 | HR 81 | Ht 65.0 in | Wt 153.4 lb

## 2023-01-03 DIAGNOSIS — F17200 Nicotine dependence, unspecified, uncomplicated: Secondary | ICD-10-CM | POA: Diagnosis not present

## 2023-01-03 DIAGNOSIS — I10 Essential (primary) hypertension: Secondary | ICD-10-CM | POA: Diagnosis not present

## 2023-01-03 MED ORDER — LOSARTAN POTASSIUM 25 MG PO TABS
12.5000 mg | ORAL_TABLET | Freq: Every day | ORAL | 3 refills | Status: DC
  Filled 2023-01-03: qty 45, 90d supply, fill #0
  Filled 2023-03-06 – 2023-03-13 (×2): qty 45, 90d supply, fill #1
  Filled 2023-06-23: qty 45, 90d supply, fill #2
  Filled 2023-12-22 (×2): qty 45, 90d supply, fill #3

## 2023-01-03 NOTE — Progress Notes (Signed)
Assessment & Plan:  Kendra Griffith was seen today for medical management of chronic issues.  Diagnoses and all orders for this visit:  Primary hypertension Continue all medications as prescribed. Stop entresto per cardiology.  BP Readings from Last 3 Encounters:  01/03/23 (!) 86/56  12/15/22 94/60  11/16/22 93/64     Tobacco dependence Ready to stop smoking. Declined refill of nicotine patches today.    Patient has been counseled on age-appropriate routine health concerns for screening and prevention. These are reviewed and up-to-date. Referrals have been placed accordingly. Immunizations are up-to-date or declined.    Subjective:   Chief Complaint  Patient presents with   Medical Management of Chronic Issues   HPI Kendra Griffith 57 y.o. female presents to office today for follow up to HTN.   She has a PMH of HTN, DM, tobacco dependence (1ppd), anxiety, bipolar disorder, ETOH abuse, systolic heart failure with EF <20%,  and LE DVT.  HTN She has asymptomatic hypotension. Entresto recently dc'd by cardiology due to reported low blood pressures. Losartan was started at 12.5 mg yesterday. She is also prescribed torsemide, spironolactone and farxiga.  BP Readings from Last 3 Encounters:  01/03/23 (!) 86/56  12/15/22 94/60  11/16/22 93/64    Losartan started today Entresto charing.   Review of Systems  Constitutional:  Negative for fever, malaise/fatigue and weight loss.  HENT: Negative.  Negative for nosebleeds.   Eyes: Negative.  Negative for blurred vision, double vision and photophobia.  Respiratory: Negative.  Negative for cough and shortness of breath.   Cardiovascular: Negative.  Negative for chest pain, palpitations and leg swelling.  Gastrointestinal: Negative.  Negative for heartburn, nausea and vomiting.  Musculoskeletal: Negative.  Negative for myalgias.  Neurological: Negative.  Negative for dizziness, focal weakness, seizures and headaches.   Psychiatric/Behavioral: Negative.  Negative for suicidal ideas.     Past Medical History:  Diagnosis Date   ABSCESS, TOOTH 08/20/2009   Qualifier: Diagnosis of  By: Huntley Dec, Scott     Anemia    Anxiety    Bipolar disorder (HCC)    Blood transfusion without reported diagnosis    childbirth   Chronic pain    feet and back   Clotting disorder (HCC)    childbirth   Dental caries    lesion soft palate   Depression    Diabetes mellitus without complication (HCC)    prediabetes   GERD (gastroesophageal reflux disease)    Hypertension    Injury    right side from jumping off bunkbed   Pre-diabetes    Sickle cell trait (HCC)    Wears glasses     Past Surgical History:  Procedure Laterality Date   BARTHOLIN CYST MARSUPIALIZATION N/A 07/16/2019   Procedure: EXCISION OF BARTHOLIN CYST;  Surgeon: Hermina Staggers, MD;  Location: [facility];  Service: Gynecology;  Laterality: N/A;   CESAREAN SECTION     three   COLONOSCOPY     cyst removal 1997     FACIAL RECONSTRUCTION SURGERY     FOOT SURGERY Bilateral    LESION REMOVAL Left 10/15/2018   Procedure: Lesion Removal left soft palate;  Surgeon: Ocie Doyne, DDS;  Location: [facility];  Service: Oral Surgery;  Laterality: Left;   RIGHT/LEFT HEART CATH AND CORONARY ANGIOGRAPHY N/A 08/29/2022   Procedure: RIGHT/LEFT HEART CATH AND CORONARY ANGIOGRAPHY;  Surgeon: Dolores Patty, MD;  Location: [facility];  Service: Cardiovascular;  Laterality: N/A;   TOOTH EXTRACTION Bilateral  10/15/2018   Procedure: DENTAL EXTRACTIONS OF TEETH NUMBER TWO, FOUR, FIVE, SIX, SEVEN, EIGHT, NINE, TEN, ELEVEN, TWELVE, THIRTEEN, FOURTEEN, SEVENTEEN, TWENTY-ONE, TWENTY-TWO, TWENTY-THREE, TWENTY-FOUR, TWENTY-FIVE, TWENTY-SIX, TWENTY-SEVEN, TWENTY-EIGHT WITH ALVEOLOPLASTY;  Surgeon: Ocie Doyne, DDS;  Location: [facility];  Service: Oral Surgery;  Laterality: Bilateral;   TUBAL LIGATION      Family History  Problem Relation Age of Onset    Heart disease Mother    Asthma Sister    Diabetes Sister    COPD Sister    Diabetes Sister    Diabetes Sister    Breast cancer Neg Hx    Colon cancer Neg Hx    Esophageal cancer Neg Hx    Pancreatic cancer Neg Hx    Liver disease Neg Hx    Stomach cancer Neg Hx     Social History Reviewed with no changes to be made today.   Outpatient Medications Prior to Visit  Medication Sig Dispense Refill   acetaminophen-codeine (TYLENOL #3) 300-30 MG tablet Take 1-2 tablets by mouth every 4 (four) hours as needed for moderate pain. 60 tablet 0   albuterol (VENTOLIN HFA) 108 (90 Base) MCG/ACT inhaler Inhale 2 puffs into the lungs every 6 (six) hours as needed for wheezing or shortness of breath. 18 g 0   amitriptyline (ELAVIL) 75 MG tablet Take 1 tablet (75 mg total) by mouth at bedtime for pain and depression 90 tablet 1   apixaban (ELIQUIS) 5 MG TABS tablet Take 1 tablet (5 mg total) by mouth 2 (two) times daily. 60 tablet 6   Biotin 5 MG CAPS Take 5 mg by mouth 3 (three) times daily.     Blood Pressure Monitor DEVI Please provide patient with insurance approved blood pressure monitor 1 each 0   CARESTART COVID-19 HOME TEST KIT See admin instructions.     cholecalciferol (VITAMIN D3) 25 MCG (1000 UT) tablet Take 1,000 Units by mouth daily.     dapagliflozin propanediol (FARXIGA) 10 MG TABS tablet Take 1 tablet (10 mg total) by mouth daily. 30 tablet 6   famotidine (PEPCID) 20 MG tablet Take 1 tablet (20 mg total) by mouth 2 (two) times daily for heartburn 180 tablet 1   gabapentin (NEURONTIN) 300 MG capsule Take 1 capsule (300 mg total) by mouth 2 (two) times daily. 180 capsule 3   hydrOXYzine (ATARAX) 50 MG tablet Take 1 tablet (50 mg total) by mouth 3 (three) times daily as needed. Please deliver 90 tablet 2   losartan (COZAAR) 25 MG tablet Take 0.5 tablets (12.5 mg total) by mouth at bedtime. 45 tablet 3   Misc. Devices MISC Please provide patient with incontinence pads or disposable underwear  or choice. N39.3 1 each PRN   omeprazole (PRILOSEC) 20 MG capsule Take 1 capsule (20 mg total) by mouth daily for heartburn 90 capsule 1   potassium chloride SA (KLOR-CON M) 20 MEQ tablet Take 1 tablet (20 mEq total) by mouth daily. 30 tablet 3   rosuvastatin (CRESTOR) 5 MG tablet Take 1 tablet (5 mg total) by mouth daily for cholesterol 90 tablet 1   spironolactone (ALDACTONE) 25 MG tablet Take 1 tablet (25 mg total) by mouth at bedtime. 30 tablet 3   torsemide (DEMADEX) 20 MG tablet Take 1 tablet (20 mg total) by mouth daily. 30 tablet 3   vitamin B-12 (CYANOCOBALAMIN) 500 MCG tablet Take 500 mcg by mouth daily.     vitamin C (ASCORBIC ACID) 500 MG tablet Take 500 mg by mouth daily.  No facility-administered medications prior to visit.    Allergies  Allergen Reactions   Amoxicillin     Pain Did it involve swelling of the face/tongue/throat, SOB, or low BP? No Did it involve sudden or severe rash/hives, skin peeling, or any reaction on the inside of your mouth or nose? No Did you need to seek medical attention at a hospital or doctor's office? Yes When did it last happen?      5 years ago If all above answers are "NO", may proceed with cephalosporin use.    Darvocet [Propoxyphene N-Acetaminophen] Nausea And Vomiting    Makes her jittery   Hydrocodone-Acetaminophen Nausea And Vomiting    Upset stomach   Percocet [Oxycodone-Acetaminophen]     Makes her jittery   Valium [Diazepam] Other (See Comments)    Makes pt. Feel "out of wack"       Objective:    BP (!) 86/56 (BP Location: Left Arm, Patient Position: Sitting, Cuff Size: Normal)   Pulse 81   Ht 5\' 5"  (1.651 m)   Wt 153 lb 6.4 oz (69.6 kg)   LMP 03/12/2019 (Approximate)   SpO2 99%   BMI 25.53 kg/m  Wt Readings from Last 3 Encounters:  01/03/23 153 lb 6.4 oz (69.6 kg)  12/15/22 148 lb (67.1 kg)  11/16/22 148 lb 13 oz (67.5 kg)    Physical Exam Vitals and nursing note reviewed.  Constitutional:      Appearance:  She is well-developed.  HENT:     Head: Normocephalic and atraumatic.  Cardiovascular:     Rate and Rhythm: Normal rate and regular rhythm.     Heart sounds: Normal heart sounds. No murmur heard.    No friction rub. No gallop.  Pulmonary:     Effort: Pulmonary effort is normal. No tachypnea or respiratory distress.     Breath sounds: Normal breath sounds. No decreased breath sounds, wheezing, rhonchi or rales.  Chest:     Chest wall: No tenderness.  Abdominal:     General: Bowel sounds are normal.     Palpations: Abdomen is soft.  Musculoskeletal:        General: Normal range of motion.     Cervical back: Normal range of motion.  Skin:    General: Skin is warm and dry.  Neurological:     Mental Status: She is alert and oriented to person, place, and time.     Coordination: Coordination normal.  Psychiatric:        Behavior: Behavior normal. Behavior is cooperative.        Thought Content: Thought content normal.        Judgment: Judgment normal.          Patient has been counseled extensively about nutrition and exercise as well as the importance of adherence with medications and regular follow-up. The patient was given clear instructions to go to ER or return to medical center if symptoms don't improve, worsen or new problems develop. The patient verbalized understanding.   Follow-up: Return in about 3 months (around 04/04/2023) for a1c.   Claiborne Rigg, FNP-BC [facility] [place], [place] [phone]   01/03/2023, 9:06 PM

## 2023-01-03 NOTE — Telephone Encounter (Signed)
Pt aware.

## 2023-01-03 NOTE — Progress Notes (Signed)
Cardiac Individual Treatment Plan  Patient Details  Name: Kendra Griffith MRN: [id number] Date of Birth: 1966-01-06 Referring Provider:   Flowsheet Row INTENSIVE CARDIAC REHAB ORIENT from 11/16/2022 in [facility]  Referring Provider Marca Ancona, MD       Initial Encounter Date:  Flowsheet Row INTENSIVE CARDIAC REHAB ORIENT from 11/16/2022 in [facility]  Date 11/16/22       Visit Diagnosis: Heart failure, chronic systolic (HCC)  Patient's Home Medications on Admission:  Current Outpatient Medications:    acetaminophen-codeine (TYLENOL #3) 300-30 MG tablet, Take 1-2 tablets by mouth every 4 (four) hours as needed for moderate pain., Disp: 60 tablet, Rfl: 0   albuterol (VENTOLIN HFA) 108 (90 Base) MCG/ACT inhaler, Inhale 2 puffs into the lungs every 6 (six) hours as needed for wheezing or shortness of breath., Disp: 18 g, Rfl: 0   amitriptyline (ELAVIL) 75 MG tablet, Take 1 tablet (75 mg total) by mouth at bedtime for pain and depression, Disp: 90 tablet, Rfl: 1   apixaban (ELIQUIS) 5 MG TABS tablet, Take 1 tablet (5 mg total) by mouth 2 (two) times daily., Disp: 60 tablet, Rfl: 6   Biotin 5 MG CAPS, Take 5 mg by mouth 3 (three) times daily., Disp: , Rfl:    Blood Pressure Monitor DEVI, Please provide patient with insurance approved blood pressure monitor, Disp: 1 each, Rfl: 0   CARESTART COVID-19 HOME TEST KIT, See admin instructions., Disp: , Rfl:    cholecalciferol (VITAMIN D3) 25 MCG (1000 UT) tablet, Take 1,000 Units by mouth daily., Disp: , Rfl:    dapagliflozin propanediol (FARXIGA) 10 MG TABS tablet, Take 1 tablet (10 mg total) by mouth daily., Disp: 30 tablet, Rfl: 6   famotidine (PEPCID) 20 MG tablet, Take 1 tablet (20 mg total) by mouth 2 (two) times daily for heartburn, Disp: 180 tablet, Rfl: 1   gabapentin (NEURONTIN) 300 MG capsule, Take 1 capsule (300 mg  total) by mouth 2 (two) times daily., Disp: 180 capsule, Rfl: 3   hydrOXYzine (ATARAX) 50 MG tablet, Take 1 tablet (50 mg total) by mouth 3 (three) times daily as needed. Please deliver, Disp: 90 tablet, Rfl: 2   Misc. Devices MISC, Please provide patient with incontinence pads or disposable underwear or choice. N39.3, Disp: 1 each, Rfl: PRN   omeprazole (PRILOSEC) 20 MG capsule, Take 1 capsule (20 mg total) by mouth daily for heartburn, Disp: 90 capsule, Rfl: 1   potassium chloride SA (KLOR-CON M) 20 MEQ tablet, Take 1 tablet (20 mEq total) by mouth daily., Disp: 30 tablet, Rfl: 3   rosuvastatin (CRESTOR) 5 MG tablet, Take 1 tablet (5 mg total) by mouth daily for cholesterol, Disp: 90 tablet, Rfl: 1   sacubitril-valsartan (ENTRESTO) 24-26 MG, Take 1 tablet by mouth 2 (two) times daily., Disp: 60 tablet, Rfl: 3   spironolactone (ALDACTONE) 25 MG tablet, Take 1 tablet (25 mg total) by mouth at bedtime., Disp: 30 tablet, Rfl: 3   torsemide (DEMADEX) 20 MG tablet, Take 1 tablet (20 mg total) by mouth daily., Disp: 30 tablet, Rfl: 3   vitamin B-12 (CYANOCOBALAMIN) 500 MCG tablet, Take 500 mcg by mouth daily., Disp: , Rfl:    vitamin C (ASCORBIC ACID) 500 MG tablet, Take 500 mg by mouth daily., Disp: , Rfl:   Past Medical History: Past Medical History:  Diagnosis Date   ABSCESS, TOOTH 08/20/2009  Qualifier: Diagnosis of  By: Huntley Dec, Scott     Anemia    Anxiety    Bipolar disorder (HCC)    Blood transfusion without reported diagnosis    childbirth   Chronic pain    feet and back   Clotting disorder (HCC)    childbirth   Dental caries    lesion soft palate   Depression    Diabetes mellitus without complication (HCC)    prediabetes   GERD (gastroesophageal reflux disease)    Hypertension    Injury    right side from jumping off bunkbed   Pre-diabetes    Sickle cell trait (HCC)    Wears glasses     Tobacco Use: Social History   Tobacco Use  Smoking Status Every Day   Current  packs/day: 2.00   Average packs/day: 2.0 packs/day for 29.0 years (58.0 ttl pk-yrs)   Types: Cigarettes  Smokeless Tobacco Never    Labs: Review Flowsheet  More data exists      Latest Ref Rng & Units 08/27/2022 08/28/2022 08/29/2022 08/30/2022 08/31/2022  Labs for ITP Cardiac and Pulmonary Rehab  Cholestrol 0 - 200 mg/dL 77  - - - -  LDL (calc) 0 - 99 mg/dL 42  - - - -  HDL-C >44 mg/dL 20  - - - -  Trlycerides <150 mg/dL 75  - - - -  Hemoglobin A1c 4.8 - 5.6 % 6.1  - - - -  PH, Arterial 7.35 - 7.45 - - 7.453  - -  PCO2 arterial 32 - 48 mmHg - - 32.8  - -  Bicarbonate 20.0 - 28.0 mmol/L - - 28.0  28.5  23.0  - -  TCO2 22 - 32 mmol/L - - 29  30  24   - -  O2 Saturation % 80.6  56.5  67.8  91.8  57  54  93  60.2  61.5  61.4     Details       Multiple values from one day are sorted in reverse-chronological order         Capillary Blood Glucose: Lab Results  Component Value Date   GLUCAP 109 (H) 12/09/2022   GLUCAP 82 12/09/2022   GLUCAP 87 11/28/2022   GLUCAP 92 11/28/2022   GLUCAP 112 (H) 11/23/2022     Exercise Target Goals: Exercise Program Goal: Individual exercise prescription set using results from initial 6 min walk test and THRR while considering  patient's activity barriers and safety.   Exercise Prescription Goal: Initial exercise prescription builds to 30-45 minutes a day of aerobic activity, 2-3 days per week.  Home exercise guidelines will be given to patient during program as part of exercise prescription that the participant will acknowledge.  Activity Barriers & Risk Stratification:  Activity Barriers & Cardiac Risk Stratification - 11/16/22 0347       Activity Barriers & Cardiac Risk Stratification   Activity Barriers Back Problems;Deconditioning;Decreased Ventricular Function;Balance Concerns;Other (comment)    Comments neuropathy both feet    Cardiac Risk Stratification High   <5 METs on            6 Minute Walk:  6 Minute Walk      Row Name 11/16/22 1116         6 Minute Walk   Phase Initial     Distance 1180 feet     Walk Time 6 minutes     # of Rest Breaks 0     MPH 2.23  METS 3.59     RPE 7     Perceived Dyspnea  0     Symptoms Yes (comment)     Comments 10/10 bilateral feet pain-chronic. resolved with rest. used go cart     Resting HR 72 bpm     Resting BP 93/64     Resting Oxygen Saturation  100 %     Exercise Oxygen Saturation  during 6 min walk 100 %     Max Ex. HR 106 bpm     Max Ex. BP 107/73     2 Minute Post BP 95/65              Oxygen Initial Assessment:   Oxygen Re-Evaluation:   Oxygen Discharge (Final Oxygen Re-Evaluation):   Initial Exercise Prescription:  Initial Exercise Prescription - 11/16/22 1300       Date of Initial Exercise RX and Referring Provider   Date 11/16/22    Referring Provider Marca Ancona, MD    Expected Discharge Date 02/08/23      NuStep   Level 1    SPM 60    Minutes 15    METs 2      Recumbant Elliptical   Level 1    RPM 60    Watts 80    Minutes 15    METs 2.5      Prescription Details   Frequency (times per week) 3    Duration Progress to 30 minutes of continuous aerobic without signs/symptoms of physical distress      Intensity   THRR 40-80% of Max Heartrate 65-131    Ratings of Perceived Exertion 11-13    Perceived Dyspnea 0-4      Progression   Progression Continue progressive overload as per policy without signs/symptoms or physical distress.      Resistance Training   Training Prescription Yes    Weight 3    Reps 10-15             Perform Capillary Blood Glucose checks as needed.  Exercise Prescription Changes:   Exercise Prescription Changes     Row Name 11/23/22 1022 11/28/22 1021 12/09/22 1028 12/28/22 1033 01/02/23 1044     Response to Exercise   Blood Pressure (Admit) 94/56 97/62 90/60  104/58 91/51   Blood Pressure (Exercise) 110/62 94/64 105/64 118/72 86/60   Blood Pressure (Exit) 114/66 107/78  97/60 103/70 100/69   Heart Rate (Admit) 83 bpm 84 bpm 80 bpm 68 bpm 104 bpm   Heart Rate (Exercise) 102 bpm 113 bpm 101 bpm 117 bpm 119 bpm   Heart Rate (Exit) 71 bpm 93 bpm 70 bpm 76 bpm 88 bpm   Rating of Perceived Exertion (Exercise) 11 11 9 13 13    Symptoms None None None None None   Comments Off to a good start with exercise. -- -- Verlon Au returned to exercise today after being absent with callous on her heel. She tolerated exercise fairly well but did move from the recumbent elliptical to the track because of foot callous. Reviewed METs with Verlon Au. Hypotensive, asymptomatic.   Duration Continue with 30 min of aerobic exercise without signs/symptoms of physical distress. Continue with 30 min of aerobic exercise without signs/symptoms of physical distress. Progress to 30 minutes of  aerobic without signs/symptoms of physical distress Continue with 30 min of aerobic exercise without signs/symptoms of physical distress. Continue with 30 min of aerobic exercise without signs/symptoms of physical distress.   Intensity THRR unchanged THRR unchanged THRR unchanged  THRR unchanged THRR unchanged     Progression   Progression Continue to progress workloads to maintain intensity without signs/symptoms of physical distress. Continue to progress workloads to maintain intensity without signs/symptoms of physical distress. Continue to progress workloads to maintain intensity without signs/symptoms of physical distress. Continue to progress workloads to maintain intensity without signs/symptoms of physical distress. Continue to progress workloads to maintain intensity without signs/symptoms of physical distress.   Average METs 1.8 2.3 2.5 2.9 1.9     Resistance Training   Training Prescription No  Relaxation day, no weights Yes Yes Yes Yes   Weight -- 3 lbs 3 lbs 3 lbs 3 lbs   Reps -- 10-15 10-15 10-15 10-15   Time -- 10 Minutes 10 Minutes 10 Minutes 10 Minutes     Interval Training   Interval Training No  No No No No     NuStep   Level 1 1 -- 1 2   SPM 69 84 -- 91 83   Minutes 15 15 -- 15 15   METs 1.8 2.3 -- 2.6 2.5     Recumbant Elliptical   Level 1 1 1 1 1    RPM -- 39 45 -- 47   Watts -- 50 51 -- 21   Minutes 15 15 15 15 15    METs -- 2.3 2.5 3.2 2.5     Home Exercise Plan   Plans to continue exercise at -- -- -- -- Home (comment)  Aerobics via Youtube, dancing, stretching   Frequency -- -- -- -- Add 4 additional days to program exercise sessions.   Initial Home Exercises Provided -- -- -- -- 12/30/22            Exercise Comments:   Exercise Comments     Row Name 11/23/22 1133 12/28/22 1126 12/30/22 1109       Exercise Comments Lunabelle tolerated low internsity exercise well without symptoms. Izzy returned to exercise today. Will progress workloads as tolerated to accomodate callous on her foot. May need to switch from the recumbent elliptical to the track. Will continue to evaluate for comfort. Reviewed home exercise guidelines and goals with Verlon Au.              Exercise Goals and Review:   Exercise Goals     Row Name 11/16/22 0802             Exercise Goals   Increase Physical Activity Yes       Intervention Provide advice, education, support and counseling about physical activity/exercise needs.;Develop an individualized exercise prescription for aerobic and resistive training based on initial evaluation findings, risk stratification, comorbidities and participant's personal goals.       Expected Outcomes Short Term: Attend rehab on a regular basis to increase amount of physical activity.;Long Term: Exercising regularly at least 3-5 days a week.;Long Term: Add in home exercise to make exercise part of routine and to increase amount of physical activity.       Increase Strength and Stamina Yes       Intervention Provide advice, education, support and counseling about physical activity/exercise needs.;Develop an individualized exercise prescription for  aerobic and resistive training based on initial evaluation findings, risk stratification, comorbidities and participant's personal goals.       Expected Outcomes Short Term: Perform resistance training exercises routinely during rehab and add in resistance training at home;Short Term: Increase workloads from initial exercise prescription for resistance, speed, and METs.;Long Term: Improve cardiorespiratory fitness, muscular endurance and strength  as measured by increased METs and functional capacity ( )       Able to understand and use rate of perceived exertion (RPE) scale Yes       Intervention Provide education and explanation on how to use RPE scale       Expected Outcomes Short Term: Able to use RPE daily in rehab to express subjective intensity level;Long Term:  Able to use RPE to guide intensity level when exercising independently       Knowledge and understanding of Target Heart Rate Range (THRR) Yes       Intervention Provide education and explanation of THRR including how the numbers were predicted and where they are located for reference       Expected Outcomes Short Term: Able to state/look up THRR;Short Term: Able to use daily as guideline for intensity in rehab;Long Term: Able to use THRR to govern intensity when exercising independently       Understanding of Exercise Prescription Yes       Intervention Provide education, explanation, and written materials on patient's individual exercise prescription       Expected Outcomes Short Term: Able to explain program exercise prescription;Long Term: Able to explain home exercise prescription to exercise independently                Exercise Goals Re-Evaluation :  Exercise Goals Re-Evaluation     Row Name 11/23/22 1133 12/05/22 0814 12/28/22 1126 12/30/22 1109       Exercise Goal Re-Evaluation   Exercise Goals Review Increase Physical Activity;Increase Strength and Stamina;Able to understand and use rate of perceived exertion (RPE)  scale Increase Physical Activity;Increase Strength and Stamina;Able to understand and use rate of perceived exertion (RPE) scale Increase Physical Activity;Increase Strength and Stamina;Able to understand and use rate of perceived exertion (RPE) scale Increase Physical Activity;Increase Strength and Stamina;Able to understand and use rate of perceived exertion (RPE) scale;Knowledge and understanding of Target Heart Rate Range (THRR);Understanding of Exercise Prescription    Comments Patient able to understand and use RPE scale appropriately. Patient havin issues with painful toenails. Exercise on hold pending appointment with podiatrist this week. Zanah returned to exercise today after being out due to foot callous. She tolerated exercise fairly well. The recumbent elliptical cause some discomfort to the callous on her heel, so she finished the station walking the track. Reviewed exercise prescription with Verlon Au. She is doing aerobic and dancing routines at home using YouTube videos. She would like to do more walking at home. Her goal is to stay motivated to exercise and to quit smoking. She has a smart watch but hasn't been using it to monitor her heart rate.    Expected Outcomes Progress workloads as tolerated to help improve strength and stamina. Will review goals upon return to exercise. Will progress workloads as tolerated. Tandria will continue daily exercise routine.             Discharge Exercise Prescription (Final Exercise Prescription Changes):  Exercise Prescription Changes - 01/02/23 1044       Response to Exercise   Blood Pressure (Admit) 91/51    Blood Pressure (Exercise) 86/60    Blood Pressure (Exit) 100/69    Heart Rate (Admit) 104 bpm    Heart Rate (Exercise) 119 bpm    Heart Rate (Exit) 88 bpm    Rating of Perceived Exertion (Exercise) 13    Symptoms None    Comments Reviewed METs with Verlon Au. Hypotensive, asymptomatic.    Duration Continue with  30 min of aerobic exercise  without signs/symptoms of physical distress.    Intensity THRR unchanged      Progression   Progression Continue to progress workloads to maintain intensity without signs/symptoms of physical distress.    Average METs 1.9      Resistance Training   Training Prescription Yes    Weight 3 lbs    Reps 10-15    Time 10 Minutes      Interval Training   Interval Training No      NuStep   Level 2    SPM 83    Minutes 15    METs 2.5      Recumbant Elliptical   Level 1    RPM 47    Watts 21    Minutes 15    METs 2.5      Home Exercise Plan   Plans to continue exercise at Home (comment)   Aerobics via Youtube, dancing, stretching   Frequency Add 4 additional days to program exercise sessions.    Initial Home Exercises Provided 12/30/22             Nutrition:  Target Goals: Understanding of nutrition guidelines, daily intake of sodium 1500mg , cholesterol 200mg , calories 30% from fat and 7% or less from saturated fats, daily to have 5 or more servings of fruits and vegetables.  Biometrics:  Pre Biometrics - 11/16/22 0837       Pre Biometrics   Waist Circumference 34 inches    Hip Circumference 37 inches    Waist to Hip Ratio 0.92 %    Triceps Skinfold 36 mm    % Body Fat 37.6 %    Grip Strength 22 kg    Flexibility 0 in   not done due to low back pain   Single Leg Stand 11.06 seconds              Nutrition Therapy Plan and Nutrition Goals:  Nutrition Therapy & Goals - 12/26/22 1007       Nutrition Therapy   Diet Heart Healthy Diet    Drug/Food Interactions Statins/Certain Fruits      Personal Nutrition Goals   Nutrition Goal Patient to identify strategies for reducing cardiovascular risk by attending the [employer] education and nutrition series weekly.    Personal Goal #2 Patient to improve diet quality by using the plate method as a guide for meal planning to include lean protein/plant protein, fruits, vegetables, whole grains, nonfat dairy as part of a  well-balanced diet.    Personal Goal #3 Patient to reduce sodium intake to 2000mg  per day    Comments Goals not met at this time. Tee has not attended Intensive cardiac rehab since 8/30; she has attended 3 exercise sessions and 3 education sessions. Prior to absences, Yulma reported motivation to reduce sugar intake (sugary coffee drinks) and continue to improve eating habits. She did also report food insecurity and difficulty getting to the grocery store; our nursing staff and [facility] continue to work on getting resources to her. She does enjoy a wide variety of foods including fish and fruits/vegetables. Patient will benefit from participation in intensive cardiac rehab for nutrition, exercise, and lifestyle modification. Will continue to assess goals upon improved attendance.      Intervention Plan   Intervention Prescribe, educate and counsel regarding individualized specific dietary modifications aiming towards targeted core components such as weight, hypertension, lipid management, diabetes, heart failure and other comorbidities.;Nutrition handout(s)  given to patient.    Expected Outcomes Short Term Goal: Understand basic principles of dietary content, such as calories, fat, sodium, cholesterol and nutrients.;Long Term Goal: Adherence to prescribed nutrition plan.             Nutrition Assessments:  MEDIFICTS Score Key: >=70 Need to make dietary changes  40-70 Heart Healthy Diet <= 40 Therapeutic Level Cholesterol Diet    Picture Your Plate Scores: <09 Unhealthy dietary pattern with much room for improvement. 41-50 Dietary pattern unlikely to meet recommendations for good health and room for improvement. 51-60 More healthful dietary pattern, with some room for improvement.  >60 Healthy dietary pattern, although there may be some specific behaviors that could be improved.    Nutrition Goals Re-Evaluation:  Nutrition Goals  Re-Evaluation     Row Name 11/23/22 1613 12/26/22 1007           Goals   Current Weight 150 lb 9.2 oz (68.3 kg) 151 lb 10.8 oz (68.8 kg)  weight from last attended session on 12/09/22      Comment lipids WNL, HDL 20, A1c 6.1, Cr 1.09, GFR 60 no new labs; most recent labs  lipids WNL, HDL 20, A1c 6.1, Cr 1.09, GFR 60      Expected Outcome Asley reports motivation to reduce sugar intake (sugary coffee drinks) and continue to improve eating habits. She does report food insecurity and difficulty getting to the grocery store; our nursing staff and the social worker at [facility] continue to work on getting resources to her. She does enjoy a wide variety of foods including fish and fruits/vegetables. Patient will benefit from participation in intensive cardiac rehab for nutrition, exercise, and lifestyle modification. Goals not met at this time. Mohini has not attended Intensive cardiac rehab since 8/30; she has attended 3 exercise sessions and 3 education sessions. Prior to absences, Loralye reported motivation to reduce sugar intake (sugary coffee drinks) and continue to improve eating habits. She did also report food insecurity and difficulty getting to the grocery store; our nursing staff and the social worker at the [facility] continue to work on getting resources to her. She does enjoy a wide variety of foods including fish and fruits/vegetables. Patient will benefit from participation in intensive cardiac rehab for nutrition, exercise, and lifestyle modification. Will continue to assess goals upon improved attendance.               Nutrition Goals Re-Evaluation:  Nutrition Goals Re-Evaluation     Row Name 11/23/22 1613 12/26/22 1007           Goals   Current Weight 150 lb 9.2 oz (68.3 kg) 151 lb 10.8 oz (68.8 kg)  weight from last attended session on 12/09/22      Comment lipids WNL, HDL 20, A1c 6.1, Cr 1.09, GFR 60 no new labs; most recent labs  lipids WNL, HDL 20,  A1c 6.1, Cr 1.09, GFR 60      Expected Outcome Makaria reports motivation to reduce sugar intake (sugary coffee drinks) and continue to improve eating habits. She does report food insecurity and difficulty getting to the grocery store; our nursing staff and the social worker at [facility] continue to work on getting resources to her. She does enjoy a wide variety of foods including fish and fruits/vegetables. Patient will benefit from participation in intensive cardiac rehab for nutrition, exercise, and lifestyle modification. Goals not met at this time. Josefa has not attended Intensive cardiac  rehab since 8/30; she has attended 3 exercise sessions and 3 education sessions. Prior to absences, Karna reported motivation to reduce sugar intake (sugary coffee drinks) and continue to improve eating habits. She did also report food insecurity and difficulty getting to the grocery store; our nursing staff and the social worker at [facility] continue to work on getting resources to her. She does enjoy a wide variety of foods including fish and fruits/vegetables. Patient will benefit from participation in intensive cardiac rehab for nutrition, exercise, and lifestyle modification. Will continue to assess goals upon improved attendance.               Nutrition Goals Discharge (Final Nutrition Goals Re-Evaluation):  Nutrition Goals Re-Evaluation - 12/26/22 1007       Goals   Current Weight 151 lb 10.8 oz (68.8 kg)   weight from last attended session on 12/09/22   Comment no new labs; most recent labs  lipids WNL, HDL 20, A1c 6.1, Cr 1.09, GFR 60    Expected Outcome Goals not met at this time. Kamiyah has not attended Intensive cardiac rehab since 8/30; she has attended 3 exercise sessions and 3 education sessions. Prior to absences, Shoshannah reported motivation to reduce sugar intake (sugary coffee drinks) and continue to improve eating habits. She did also report food insecurity and  difficulty getting to the grocery store; our nursing staff and the social worker at [facility] continue to work on getting resources to her. She does enjoy a wide variety of foods including fish and fruits/vegetables. Patient will benefit from participation in intensive cardiac rehab for nutrition, exercise, and lifestyle modification. Will continue to assess goals upon improved attendance.             Psychosocial: Target Goals: Acknowledge presence or absence of significant depression and/or stress, maximize coping skills, provide positive support system. Participant is able to verbalize types and ability to use techniques and skills needed for reducing stress and depression.  Initial Review & Psychosocial Screening:  Initial Psych Review & Screening - 11/16/22 1030       Initial Review   Current issues with Current Depression;Current Anxiety/Panic;Current Sleep Concerns;Current Stress Concerns    Source of Stress Concerns Chronic Illness;Financial    Comments Lam shared that she has low levels of depression and anxiety currently. She has a long history of depression and is connected with several resources through behavioral health and social work. She denies any further need for resources at this time and is trying to get reconnected with counseling through behavioral health.      Family Dynamics   Good Support System? Yes   Shakerah has her children for support     Barriers   Psychosocial barriers to participate in program The patient should benefit from training in stress management and relaxation.      Screening Interventions   Interventions Encouraged to exercise;Provide feedback about the scores to participant;To provide support and resources with identified psychosocial needs    Expected Outcomes Short Term goal: Utilizing psychosocial counselor, staff and physician to assist with identification of specific Stressors or current issues interfering with healing  process. Setting desired goal for each stressor or current issue identified.;Long Term Goal: Stressors or current issues are controlled or eliminated.;Short Term goal: Identification and review with participant of any Quality of Life or Depression concerns found by scoring the questionnaire.;Long Term goal: The participant improves quality of Life and PHQ9 Scores as seen by post scores and/or  verbalization of changes             Quality of Life Scores:  Quality of Life - 11/16/22 1117       Quality of Life   Select Quality of Life      Quality of Life Scores   Health/Function Pre 11.2 %    Socioeconomic Pre 7.5 %    Psych/Spiritual Pre 24 %    Family Pre 22.8 %    GLOBAL Pre 14.57 %            Scores of 19 and below usually indicate a poorer quality of life in these areas.  A difference of  2-3 points is a clinically meaningful difference.  A difference of 2-3 points in the total score of the Quality of Life Index has been associated with significant improvement in overall quality of life, self-image, physical symptoms, and general health in studies assessing change in quality of life.  PHQ-9: Review Flowsheet  More data exists      11/16/2022 10/11/2022 08/17/2022 05/18/2022 01/05/2022  Depression screen PHQ 2/9  Decreased Interest 2 1 2 1 2   Down, Depressed, Hopeless 2 2 2 1 2   PHQ - 2 Score 4 3 4 2 4   Altered sleeping 2 1 2 1 2   Tired, decreased energy 2 1 2 1 2   Change in appetite 2 1 2 1 2   Feeling bad or failure about yourself  2 1 2 1 2   Trouble concentrating 2 2 2 1 2   Moving slowly or fidgety/restless 2 3 2 1 2   Suicidal thoughts 1 0 2 1 2   PHQ-9 Score 17 12 18 9 18   Difficult doing work/chores Not difficult at all - - - -    Details           Interpretation of Total Score  Total Score Depression Severity:  1-4 = Minimal depression, 5-9 = Mild depression, 10-14 = Moderate depression, 15-19 = Moderately severe depression, 20-27 = Severe depression    Psychosocial Evaluation and Intervention:   Psychosocial Re-Evaluation:  Psychosocial Re-Evaluation     Row Name 12/02/22 1504 12/29/22 1451           Psychosocial Re-Evaluation   Current issues with Current Sleep Concerns;Current Stress Concerns;Current Depression;Current Anxiety/Panic Current Sleep Concerns;Current Stress Concerns;Current Depression;Current Anxiety/Panic      Comments Will review quality of life and PHQ2-9 upon return to exercise. Quality of life and PHQ2-9 reviewed.Shakea says her chest pain shortness of breath is getting better. Dashea says she is not as depressed as when she first started cardiac rehab.Marland Kitchen Janeira says the antidepressant she is taking is controlling her depression. Makayela says she is getting counseling from [facility].      Expected Outcomes Rosaline will have controlled or decreased depression/ stressors  upon completion of cardiac rehab Bethaney will have controlled or decreased depression/ stressors  upon completion of cardiac rehab      Interventions Stress management education;Encouraged to attend Cardiac Rehabilitation for the exercise;Relaxation education Stress management education;Encouraged to attend Cardiac Rehabilitation for the exercise;Relaxation education      Continue Psychosocial Services  Follow up required by staff Follow up required by staff        Initial Review   Source of Stress Concerns Chronic Illness;Unable to participate in former interests or hobbies;Unable to perform yard/household activities;Financial Chronic Illness;Unable to participate in former interests or hobbies;Unable to perform yard/household activities;Financial      Comments will continue to monitor and offer support  as needed will continue to monitor and offer support as needed               Psychosocial Discharge (Final Psychosocial Re-Evaluation):  Psychosocial Re-Evaluation - 12/29/22 1451       Psychosocial Re-Evaluation   Current issues with Current Sleep  Concerns;Current Stress Concerns;Current Depression;Current Anxiety/Panic    Comments Quality of life and PHQ2-9 reviewed.Kimbria says her chest pain shortness of breath is getting better. Lacy says she is not as depressed as when she first started cardiac rehab.Marland Kitchen Tenae says the antidepressant she is taking is controlling her depression. Sarie says she is getting [facility] from [facility].    Expected Outcomes Destaney will have controlled or decreased depression/ stressors  upon completion of cardiac rehab    Interventions Stress management education;Encouraged to attend Cardiac Rehabilitation for the exercise;Relaxation education    Continue Psychosocial Services  Follow up required by staff      Initial Review   Source of Stress Concerns Chronic Illness;Unable to participate in former interests or hobbies;Unable to perform yard/household activities;Financial    Comments will continue to monitor and offer support as needed             Vocational Rehabilitation: Provide vocational rehab assistance to qualifying candidates.   Vocational Rehab Evaluation & Intervention:  Vocational Rehab - 11/16/22 4098       Initial Vocational Rehab Evaluation & Intervention   Assessment shows need for Vocational Rehabilitation No   Chattie is on disability            Education: Education Goals: Education classes will be provided on a weekly basis, covering required topics. Participant will state understanding/return demonstration of topics presented.    Education     Row Name 11/23/22 1500     Education   Cardiac Education Topics Pritikin   Orthoptist   Educator Dietitian   Weekly Topic Powerhouse Plant-Based Proteins   Instruction Review Code 1- Verbalizes Understanding   Class Start Time 1145   Class Stop Time 1228   Class Time Calculation (min) 43 min    Row Name 11/28/22 1100     Education   Cardiac Education Topics Pritikin   Building surveyor Psychosocial   Psychosocial Workshop From Head to Heart: The Power of a Healthy Outlook   Instruction Review Code 1- Verbalizes Understanding   Class Start Time 1155   Class Stop Time 1245   Class Time Calculation (min) 50 min    Row Name 12/09/22 1300     Education   Cardiac Education Topics Pritikin   Nurse, children's   Educator Nurse   Select Psychosocial   Psychosocial Healthy Minds, Bodies, Hearts   Instruction Review Code 1- Verbalizes Understanding   Class Start Time 1145   Class Stop Time 1222   Class Time Calculation (min) 37 min    Row Name 12/28/22 1400     Education   Cardiac Education Topics Pritikin   Orthoptist   Educator Dietitian   Weekly Topic Tasty Appetizers and Snacks   Instruction Review Code 1- Verbalizes Understanding   Class Start Time 1145   Class Stop Time 1225   Class Time Calculation (min) 40 min    Row Name 12/30/22 1400     Education   Cardiac Education Topics  Pritikin   Psychologist, forensic Nutrition   Nutrition Calorie Density   Instruction Review Code 1- Verbalizes Understanding   Class Start Time 1155   Class Stop Time 1235   Class Time Calculation (min) 40 min    Row Name 01/02/23 1300     Education   Cardiac Education Topics Pritikin   Select Workshops     Workshops   Educator Exercise Physiologist   Select Exercise   Exercise Workshop Exercise Basics: Building Your Action Plan   Instruction Review Code 1- Verbalizes Understanding   Class Start Time 1155   Class Stop Time 1237   Class Time Calculation (min) 42 min            Core Videos: Exercise    Move It!  Clinical staff conducted group or individual video education with verbal and written material and guidebook.  Patient learns the recommended Pritikin exercise program. Exercise with the goal of  living a long, healthy life. Some of the health benefits of exercise include controlled diabetes, healthier blood pressure levels, improved cholesterol levels, improved heart and lung capacity, improved sleep, and better body composition. Everyone should speak with their doctor before starting or changing an exercise routine.  Biomechanical Limitations Clinical staff conducted group or individual video education with verbal and written material and guidebook.  Patient learns how biomechanical limitations can impact exercise and how we can mitigate and possibly overcome limitations to have an impactful and balanced exercise routine.  Body Composition Clinical staff conducted group or individual video education with verbal and written material and guidebook.  Patient learns that body composition (ratio of muscle mass to fat mass) is a key component to assessing overall fitness, rather than body weight alone. Increased fat mass, especially visceral belly fat, can put [place] at increased risk for metabolic syndrome, type 2 diabetes, heart disease, and even death. It is recommended to combine diet and exercise (cardiovascular and resistance training) to improve your body composition. Seek guidance from your physician and exercise physiologist before implementing an exercise routine.  Exercise Action Plan Clinical staff conducted group or individual video education with verbal and written material and guidebook.  Patient learns the recommended strategies to achieve and enjoy long-term exercise adherence, including variety, self-motivation, self-efficacy, and positive decision making. Benefits of exercise include fitness, good health, weight management, more energy, better sleep, less stress, and overall well-being.  Medical   Heart Disease Risk Reduction Clinical staff conducted group or individual video education with verbal and written material and guidebook.  Patient learns our heart is our most vital  organ as it circulates oxygen, nutrients, white blood cells, and hormones throughout the entire body, and carries waste away. Data supports a plant-based eating plan like the Pritikin Program for its effectiveness in slowing progression of and reversing heart disease. The video provides a number of recommendations to address heart disease.   Metabolic Syndrome and Belly Fat  Clinical staff conducted group or individual video education with verbal and written material and guidebook.  Patient learns what metabolic syndrome is, how it leads to heart disease, and how one can reverse it and keep it from coming back. You have metabolic syndrome if you have 3 of the following 5 criteria: abdominal obesity, high blood pressure, high triglycerides, low HDL cholesterol, and high blood sugar.  Hypertension and Heart Disease Clinical staff conducted group or individual video education with verbal and written material  and guidebook.  Patient learns that high blood pressure, or hypertension, is very common in the [place]. Hypertension is largely due to excessive salt intake, but other important risk factors include being overweight, physical inactivity, drinking too much alcohol, smoking, and not eating enough potassium from fruits and vegetables. High blood pressure is a leading risk factor for heart attack, stroke, congestive heart failure, dementia, kidney failure, and premature death. Long-term effects of excessive salt intake include stiffening of the arteries and thickening of heart muscle and organ damage. Recommendations include ways to reduce hypertension and the risk of heart disease.  Diseases of Our Time - Focusing on Diabetes Clinical staff conducted group or individual video education with verbal and written material and guidebook.  Patient learns why the best way to stop diseases of our time is prevention, through food and other lifestyle changes. Medicine (such as prescription pills and  surgeries) is often only a Band-Aid on the problem, not a long-term solution. Most common diseases of our time include obesity, type 2 diabetes, hypertension, heart disease, and cancer. The Pritikin Program is recommended and has been proven to help reduce, reverse, and/or prevent the damaging effects of metabolic syndrome.  Nutrition   Overview of the Pritikin Eating Plan  Clinical staff conducted group or individual video education with verbal and written material and guidebook.  Patient learns about the Pritikin Eating Plan for disease risk reduction. The Pritikin Eating Plan emphasizes a wide variety of unrefined, minimally-processed carbohydrates, like fruits, vegetables, whole grains, and legumes. Go, Caution, and Stop food choices are explained. Plant-based and lean animal proteins are emphasized. Rationale provided for low sodium intake for blood pressure control, low added sugars for blood sugar stabilization, and low added fats and oils for coronary artery disease risk reduction and weight management.  Calorie Density  Clinical staff conducted group or individual video education with verbal and written material and guidebook.  Patient learns about calorie density and how it impacts the Pritikin Eating Plan. Knowing the characteristics of the food you choose will help you decide whether those foods will lead to weight gain or weight loss, and whether you want to consume more or less of them. Weight loss is usually a side effect of the Pritikin Eating Plan because of its focus on low calorie-dense foods.  Label Reading  Clinical staff conducted group or individual video education with verbal and written material and guidebook.  Patient learns about the Pritikin recommended label reading guidelines and corresponding recommendations regarding calorie density, added sugars, sodium content, and whole grains.  Dining Out - Part 1  Clinical staff conducted group or individual video education with  verbal and written material and guidebook.  Patient learns that restaurant meals can be sabotaging because they can be so high in calories, fat, sodium, and/or sugar. Patient learns recommended strategies on how to positively address this and avoid unhealthy pitfalls.  Facts on Fats  Clinical staff conducted group or individual video education with verbal and written material and guidebook.  Patient learns that lifestyle modifications can be just as effective, if not more so, as many medications for lowering your risk of heart disease. A Pritikin lifestyle can help to reduce your risk of inflammation and atherosclerosis (cholesterol build-up, or plaque, in the artery walls). Lifestyle interventions such as dietary choices and physical activity address the cause of atherosclerosis. A review of the types of fats and their impact on blood cholesterol levels, along with dietary recommendations to reduce fat intake is also included.  Nutrition Action Plan  Clinical staff conducted group or individual video education with verbal and written material and guidebook.  Patient learns how to incorporate Pritikin recommendations into their lifestyle. Recommendations include planning and keeping personal health goals in mind as an important part of their success.  Healthy Mind-Set    Healthy Minds, Bodies, Hearts  Clinical staff conducted group or individual video education with verbal and written material and guidebook.  Patient learns how to identify when they are stressed. Video will discuss the impact of that stress, as well as the many benefits of stress management. Patient will also be introduced to stress management techniques. The way we think, act, and feel has an impact on our hearts.  How Our Thoughts Can Heal Our Hearts  Clinical staff conducted group or individual video education with verbal and written material and guidebook.  Patient learns that negative thoughts can cause depression and anxiety.  This can result in negative lifestyle behavior and serious health problems. Cognitive behavioral therapy is an effective method to help control our thoughts in order to change and improve our emotional outlook.  Additional Videos:  Exercise    Improving Performance  Clinical staff conducted group or individual video education with verbal and written material and guidebook.  Patient learns to use a non-linear approach by alternating intensity levels and lengths of time spent exercising to help burn more calories and lose more body fat. Cardiovascular exercise helps improve heart health, metabolism, hormonal balance, blood sugar control, and recovery from fatigue. Resistance training improves strength, endurance, balance, coordination, reaction time, metabolism, and muscle mass. Flexibility exercise improves circulation, posture, and balance. Seek guidance from your physician and exercise physiologist before implementing an exercise routine and learn your capabilities and proper form for all exercise.  Introduction to Yoga  Clinical staff conducted group or individual video education with verbal and written material and guidebook.  Patient learns about yoga, a discipline of the coming together of mind, breath, and body. The benefits of yoga include improved flexibility, improved range of motion, better posture and core strength, increased lung function, weight loss, and positive self-image. Yoga's heart health benefits include lowered blood pressure, healthier heart rate, decreased cholesterol and triglyceride levels, improved immune function, and reduced stress. Seek guidance from your physician and exercise physiologist before implementing an exercise routine and learn your capabilities and proper form for all exercise.  Medical   Aging: Enhancing Your Quality of Life  Clinical staff conducted group or individual video education with verbal and written material and guidebook.  Patient learns key  strategies and recommendations to stay in good physical health and enhance quality of life, such as prevention strategies, having an advocate, securing a Health Care Proxy and Power of Attorney, and keeping a list of medications and system for tracking them. It also discusses how to avoid risk for bone loss.  Biology of Weight Control  Clinical staff conducted group or individual video education with verbal and written material and guidebook.  Patient learns that weight gain occurs because we consume more calories than we burn (eating more, moving less). Even if your body weight is normal, you may have higher ratios of fat compared to muscle mass. Too much body fat puts you at increased risk for cardiovascular disease, heart attack, stroke, type 2 diabetes, and obesity-related cancers. In addition to exercise, following the Pritikin Eating Plan can help reduce your risk.  Decoding Lab Results  Clinical staff conducted group or individual video education with verbal and written  material and guidebook.  Patient learns that lab test reflects one measurement whose values change over time and are influenced by many factors, including medication, stress, sleep, exercise, food, hydration, pre-existing medical conditions, and more. It is recommended to use the knowledge from this video to become more involved with your lab results and evaluate your numbers to speak with your doctor.   Diseases of Our Time - Overview  Clinical staff conducted group or individual video education with verbal and written material and guidebook.  Patient learns that according to the CDC, 50% to 70% of chronic diseases (such as obesity, type 2 diabetes, elevated lipids, hypertension, and heart disease) are avoidable through lifestyle improvements including healthier food choices, listening to satiety cues, and increased physical activity.  Sleep Disorders Clinical staff conducted group or individual video education with verbal and  written material and guidebook.  Patient learns how good quality and duration of sleep are important to overall health and well-being. Patient also learns about sleep disorders and how they impact health along with recommendations to address them, including discussing with a physician.  Nutrition  Dining Out - Part 2 Clinical staff conducted group or individual video education with verbal and written material and guidebook.  Patient learns how to plan ahead and communicate in order to maximize their dining experience in a healthy and nutritious manner. Included are recommended food choices based on the type of restaurant the patient is visiting.   Fueling a Banker conducted group or individual video education with verbal and written material and guidebook.  There is a strong connection between our food choices and our health. Diseases like obesity and type 2 diabetes are very prevalent and are in large-part due to lifestyle choices. The Pritikin Eating Plan provides plenty of food and hunger-curbing satisfaction. It is easy to follow, affordable, and helps reduce health risks.  Menu Workshop  Clinical staff conducted group or individual video education with verbal and written material and guidebook.  Patient learns that restaurant meals can sabotage health goals because they are often packed with calories, fat, sodium, and sugar. Recommendations include strategies to plan ahead and to communicate with the manager, chef, or server to help order a healthier meal.  Planning Your Eating Strategy  Clinical staff conducted group or individual video education with verbal and written material and guidebook.  Patient learns about the Pritikin Eating Plan and its benefit of reducing the risk of disease. The Pritikin Eating Plan does not focus on calories. Instead, it emphasizes high-quality, nutrient-rich foods. By knowing the characteristics of the foods, we choose, we can determine  their calorie density and make informed decisions.  Targeting Your Nutrition Priorities  Clinical staff conducted group or individual video education with verbal and written material and guidebook.  Patient learns that lifestyle habits have a tremendous impact on disease risk and progression. This video provides eating and physical activity recommendations based on your personal health goals, such as reducing LDL cholesterol, losing weight, preventing or controlling type 2 diabetes, and reducing high blood pressure.  Vitamins and Minerals  Clinical staff conducted group or individual video education with verbal and written material and guidebook.  Patient learns different ways to obtain key vitamins and minerals, including through a recommended healthy diet. It is important to discuss all supplements you take with your doctor.   Healthy Mind-Set    Smoking Cessation  Clinical staff conducted group or individual video education with verbal and written material and guidebook.  Patient  learns that cigarette smoking and tobacco addiction pose a serious health risk which affects millions of people. Stopping smoking will significantly reduce the risk of heart disease, lung disease, and many forms of cancer. Recommended strategies for quitting are covered, including working with your doctor to develop a successful plan.  Culinary   Becoming a Set designer conducted group or individual video education with verbal and written material and guidebook.  Patient learns that cooking at home can be healthy, cost-effective, quick, and puts them in control. Keys to cooking healthy recipes will include looking at your recipe, assessing your equipment needs, planning ahead, making it simple, choosing cost-effective seasonal ingredients, and limiting the use of added fats, salts, and sugars.  Cooking - Breakfast and Snacks  Clinical staff conducted group or individual video education with verbal  and written material and guidebook.  Patient learns how important breakfast is to satiety and nutrition through the entire day. Recommendations include key foods to eat during breakfast to help stabilize blood sugar levels and to prevent overeating at meals later in the day. Planning ahead is also a key component.  Cooking - Educational psychologist conducted group or individual video education with verbal and written material and guidebook.  Patient learns eating strategies to improve overall health, including an approach to cook more at home. Recommendations include thinking of animal protein as a side on your plate rather than center stage and focusing instead on lower calorie dense options like vegetables, fruits, whole grains, and plant-based proteins, such as beans. Making sauces in large quantities to freeze for later and leaving the skin on your vegetables are also recommended to maximize your experience.  Cooking - Healthy Salads and Dressing Clinical staff conducted group or individual video education with verbal and written material and guidebook.  Patient learns that vegetables, fruits, whole grains, and legumes are the foundations of the Pritikin Eating Plan. Recommendations include how to incorporate each of these in flavorful and healthy salads, and how to create homemade salad dressings. Proper handling of ingredients is also covered. Cooking - Soups and [employer] - Soups and Desserts Clinical staff conducted group or individual video education with verbal and written material and guidebook.  Patient learns that Pritikin soups and desserts make for easy, nutritious, and delicious snacks and meal components that are low in sodium, fat, sugar, and calorie density, while high in vitamins, minerals, and filling fiber. Recommendations include simple and healthy ideas for soups and desserts.   Overview     The Pritikin Solution Program Overview Clinical staff conducted  group or individual video education with verbal and written material and guidebook.  Patient learns that the results of the Pritikin Program have been documented in more than 100 articles published in peer-reviewed journals, and the benefits include reducing risk factors for (and, in some cases, even reversing) high cholesterol, high blood pressure, type 2 diabetes, obesity, and more! An overview of the three key pillars of the Pritikin Program will be covered: eating well, doing regular exercise, and having a healthy mind-set.  WORKSHOPS  Exercise: Exercise Basics: Building Your Action Plan Clinical staff led group instruction and group discussion with PowerPoint presentation and patient guidebook. To enhance the learning environment the use of posters, models and videos may be added. At the conclusion of this workshop, patients will comprehend the difference between physical activity and exercise, as well as the benefits of incorporating both, into their routine. Patients will understand the FITT (  Frequency, Intensity, Time, and Type) principle and how to use it to build an exercise action plan. In addition, safety concerns and other considerations for exercise and cardiac rehab will be addressed by the presenter. The purpose of this lesson is to promote a comprehensive and effective weekly exercise routine in order to improve patients' overall level of fitness.   Managing Heart Disease: Your Path to a Healthier Heart Clinical staff led group instruction and group discussion with PowerPoint presentation and patient guidebook. To enhance the learning environment the use of posters, models and videos may be added.At the conclusion of this workshop, patients will understand the anatomy and physiology of the heart. Additionally, they will understand how Pritikin's three pillars impact the risk factors, the progression, and the management of heart disease.  The purpose of this lesson is to provide a  high-level overview of the heart, heart disease, and how the Pritikin lifestyle positively impacts risk factors.  Exercise Biomechanics Clinical staff led group instruction and group discussion with PowerPoint presentation and patient guidebook. To enhance the learning environment the use of posters, models and videos may be added. Patients will learn how the structural parts of their bodies function and how these functions impact their daily activities, movement, and exercise. Patients will learn how to promote a neutral spine, learn how to manage pain, and identify ways to improve their physical movement in order to promote healthy living. The purpose of this lesson is to expose patients to common physical limitations that impact physical activity. Participants will learn practical ways to adapt and manage aches and pains, and to minimize their effect on regular exercise. Patients will learn how to maintain good posture while sitting, walking, and lifting.  Balance Training and Fall Prevention  Clinical staff led group instruction and group discussion with PowerPoint presentation and patient guidebook. To enhance the learning environment the use of posters, models and videos may be added. At the conclusion of this workshop, patients will understand the importance of their sensorimotor skills (vision, proprioception, and the vestibular system) in maintaining their ability to balance as they age. Patients will apply a variety of balancing exercises that are appropriate for their current level of function. Patients will understand the common causes for poor balance, possible solutions to these problems, and ways to modify their physical environment in order to minimize their fall risk. The purpose of this lesson is to teach patients about the importance of maintaining balance as they age and ways to minimize their risk of falling.  WORKSHOPS   Nutrition:  Fueling a Ship broker  led group instruction and group discussion with PowerPoint presentation and patient guidebook. To enhance the learning environment the use of posters, models and videos may be added. Patients will review the foundational principles of the Pritikin Eating Plan and understand what constitutes a serving size in each of the food groups. Patients will also learn Pritikin-friendly foods that are better choices when away from home and review make-ahead meal and snack options. Calorie density will be reviewed and applied to three nutrition priorities: weight maintenance, weight loss, and weight gain. The purpose of this lesson is to reinforce (in a group setting) the key concepts around what patients are recommended to eat and how to apply these guidelines when away from home by planning and selecting Pritikin-friendly options. Patients will understand how calorie density may be adjusted for different weight management goals.  Mindful Eating  Clinical staff led group instruction and group discussion with PowerPoint presentation  and patient guidebook. To enhance the learning environment the use of posters, models and videos may be added. Patients will briefly review the concepts of the Pritikin Eating Plan and the importance of low-calorie dense foods. The concept of mindful eating will be introduced as well as the importance of paying attention to internal hunger signals. Triggers for non-hunger eating and techniques for dealing with triggers will be explored. The purpose of this lesson is to provide patients with the opportunity to review the basic principles of the Pritikin Eating Plan, discuss the value of eating mindfully and how to measure internal cues of hunger and fullness using the Hunger Scale. Patients will also discuss reasons for non-hunger eating and learn strategies to use for controlling emotional eating.  Targeting Your Nutrition Priorities Clinical staff led group instruction and group discussion  with PowerPoint presentation and patient guidebook. To enhance the learning environment the use of posters, models and videos may be added. Patients will learn how to determine their genetic susceptibility to disease by reviewing their family history. Patients will gain insight into the importance of diet as part of an overall healthy lifestyle in mitigating the impact of genetics and other environmental insults. The purpose of this lesson is to provide patients with the opportunity to assess their personal nutrition priorities by looking at their family history, their own health history and current risk factors. Patients will also be able to discuss ways of prioritizing and modifying the Pritikin Eating Plan for their highest risk areas  Menu  Clinical staff led group instruction and group discussion with PowerPoint presentation and patient guidebook. To enhance the learning environment the use of posters, models and videos may be added. Using menus brought in from [employer], or printed from [employer], patients will apply the Pritikin dining out guidelines that were presented in the [employer] video. Patients will also be able to practice these guidelines in a variety of provided scenarios. The purpose of this lesson is to provide patients with the opportunity to practice hands-on learning of the Pritikin Dining Out guidelines with actual menus and practice scenarios.  Label Reading Clinical staff led group instruction and group discussion with PowerPoint presentation and patient guidebook. To enhance the learning environment the use of posters, models and videos may be added. Patients will review and discuss the Pritikin label reading guidelines presented in Pritikin's Label Reading Educational series video. Using fool labels brought in from local grocery stores and markets, patients will apply the label reading guidelines and determine if the packaged food meet the Pritikin  guidelines. The purpose of this lesson is to provide patients with the opportunity to review, discuss, and practice hands-on learning of the Pritikin Label Reading guidelines with actual packaged food labels. Cooking School  Pritikin's [employer] are designed to teach patients ways to prepare quick, simple, and affordable recipes at home. The importance of nutrition's role in chronic disease risk reduction is reflected in its emphasis in the overall Pritikin program. By learning how to prepare essential core Pritikin Eating Plan recipes, patients will increase control over what they eat; be able to customize the flavor of foods without the use of added salt, sugar, or fat; and improve the quality of the food they consume. By learning a set of core recipes which are easily assembled, quickly prepared, and affordable, patients are more likely to prepare more healthy foods at home. These workshops focus on convenient breakfasts, simple entres, side dishes, and desserts which can be  prepared with minimal effort and are consistent with nutrition recommendations for cardiovascular risk reduction. Cooking [employer] are taught by a Armed forces logistics/support/administrative officer (RD) who has been trained by the [employer]. The chef or RD has a clear understanding of the importance of minimizing - if not completely eliminating - added fat, sugar, and sodium in recipes. Throughout the series of Cooking School Workshop sessions, patients will learn about healthy ingredients and efficient methods of cooking to build confidence in their capability to prepare    Cooking School weekly topics:  Adding Flavor- Sodium-Free  Fast and Healthy Breakfasts  Powerhouse Plant-Based Proteins  Satisfying Salads and Dressings  Simple Sides and Sauces  International Cuisine-Spotlight on the [employer] Zones  Delicious Desserts  Savory Soups  [employer] - Meals in a Astronomer Appetizers and  Snacks  Comforting Weekend Breakfasts  One-Pot Wonders   Fast Evening Meals  Landscape architect Your Pritikin Plate  WORKSHOPS   Healthy Mindset (Psychosocial):  Focused Goals, Sustainable Changes Clinical staff led group instruction and group discussion with PowerPoint presentation and patient guidebook. To enhance the learning environment the use of posters, models and videos may be added. Patients will be able to apply effective goal setting strategies to establish at least one personal goal, and then take consistent, meaningful action toward that goal. They will learn to identify common barriers to achieving personal goals and develop strategies to overcome them. Patients will also gain an understanding of how our mind-set can impact our ability to achieve goals and the importance of cultivating a positive and growth-oriented mind-set. The purpose of this lesson is to provide patients with a deeper understanding of how to set and achieve personal goals, as well as the tools and strategies needed to overcome common obstacles which may arise along the way.  From Head to Heart: The Power of a Healthy Outlook  Clinical staff led group instruction and group discussion with PowerPoint presentation and patient guidebook. To enhance the learning environment the use of posters, models and videos may be added. Patients will be able to recognize and describe the impact of emotions and mood on physical health. They will discover the importance of self-care and explore self-care practices which may work for them. Patients will also learn how to utilize the 4 C's to cultivate a healthier outlook and better manage stress and challenges. The purpose of this lesson is to demonstrate to patients how a healthy outlook is an essential part of maintaining good health, especially as they continue their cardiac rehab journey.  Healthy Sleep for a Healthy Heart Clinical staff led group instruction and  group discussion with PowerPoint presentation and patient guidebook. To enhance the learning environment the use of posters, models and videos may be added. At the conclusion of this workshop, patients will be able to demonstrate knowledge of the importance of sleep to overall health, well-being, and quality of life. They will understand the symptoms of, and treatments for, common sleep disorders. Patients will also be able to identify daytime and nighttime behaviors which impact sleep, and they will be able to apply these tools to help manage sleep-related challenges. The purpose of this lesson is to provide patients with a general overview of sleep and outline the importance of quality sleep. Patients will learn about a few of the most common sleep disorders. Patients will also be introduced to the concept of "sleep hygiene," and discover ways to self-manage certain sleeping problems through simple daily  behavior changes. Finally, the workshop will motivate patients by clarifying the links between quality sleep and their goals of heart-healthy living.   Recognizing and Reducing Stress Clinical staff led group instruction and group discussion with PowerPoint presentation and patient guidebook. To enhance the learning environment the use of posters, models and videos may be added. At the conclusion of this workshop, patients will be able to understand the types of stress reactions, differentiate between acute and chronic stress, and recognize the impact that chronic stress has on their health. They will also be able to apply different coping mechanisms, such as reframing negative self-talk. Patients will have the opportunity to practice a variety of stress management techniques, such as deep abdominal breathing, progressive muscle relaxation, and/or guided imagery.  The purpose of this lesson is to educate patients on the role of stress in their lives and to provide healthy techniques for coping with  it.  Learning Barriers/Preferences:  Learning Barriers/Preferences - 11/16/22 8295       Learning Barriers/Preferences   Learning Barriers Sight   reading glasses   Learning Preferences Audio;Computer/Internet;Group Instruction;Individual Instruction;Pictoral;Skilled Demonstration;Verbal Instruction;Written Material;Video             Education Topics:  Knowledge Questionnaire Score:  Knowledge Questionnaire Score - 11/16/22 0923       Knowledge Questionnaire Score   Pre Score 21/28             Core Components/Risk Factors/Patient Goals at Admission:  Personal Goals and Risk Factors at Admission - 11/16/22 0802       Core Components/Risk Factors/Patient Goals on Admission    Weight Management Yes    Intervention Weight Management: Provide education and appropriate resources to help participant work on and attain dietary goals.;Weight Management: Develop a combined nutrition and exercise program designed to reach desired caloric intake, while maintaining appropriate intake of nutrient and fiber, sodium and fats, and appropriate energy expenditure required for the weight goal.    Expected Outcomes Short Term: Continue to assess and modify interventions until short term weight is achieved;Long Term: Adherence to nutrition and physical activity/exercise program aimed toward attainment of established weight goal;Understanding recommendations for meals to include 15-35% energy as protein, 25-35% energy from fat, 35-60% energy from carbohydrates, less than 200mg  of dietary cholesterol, 20-35 gm of total fiber daily;Understanding of distribution of calorie intake throughout the day with the consumption of 4-5 meals/snacks    Tobacco Cessation Yes    Number of packs per day 1-2    Intervention Assist the participant in steps to quit. Provide individualized education and counseling about committing to Tobacco Cessation, relapse prevention, and pharmacological support that can be  provided by physician.;Education officer, environmental, assist with locating and accessing local/national Quit Smoking programs, and support quit date choice.    Expected Outcomes Short Term: Will demonstrate readiness to quit, by selecting a quit date.;Long Term: Complete abstinence from all tobacco products for at least 12 months from quit date.;Short Term: Will quit all tobacco product use, adhering to prevention of relapse plan.    Diabetes Yes    Intervention Provide education about proper nutrition, including hydration, and aerobic/resistive exercise prescription along with prescribed medications to achieve blood glucose in normal ranges: Fasting glucose 65-99 mg/dL;Provide education about signs/symptoms and action to take for hypo/hyperglycemia.    Expected Outcomes Short Term: Participant verbalizes understanding of the signs/symptoms and immediate care of hyper/hypoglycemia, proper foot care and importance of medication, aerobic/resistive exercise and nutrition plan for blood glucose control.;Long Term: Attainment of  HbA1C < 7%.    Heart Failure Yes    Intervention Provide a combined exercise and nutrition program that is supplemented with education, support and counseling about heart failure. Directed toward relieving symptoms such as shortness of breath, decreased exercise tolerance, and extremity edema.    Expected Outcomes Improve functional capacity of life;Short term: Attendance in program 2-3 days a week with increased exercise capacity. Reported lower sodium intake. Reported increased fruit and vegetable intake. Reports medication compliance.;Short term: Daily weights obtained and reported for increase. Utilizing diuretic protocols set by physician.;Long term: Adoption of self-care skills and reduction of barriers for early signs and symptoms recognition and intervention leading to self-care maintenance.    Hypertension Yes    Intervention Provide education on lifestyle modifcations including  regular physical activity/exercise, weight management, moderate sodium restriction and increased consumption of fresh fruit, vegetables, and low fat dairy, alcohol moderation, and smoking cessation.;Monitor prescription use compliance.    Expected Outcomes Short Term: Continued assessment and intervention until BP is < 140/29mm HG in hypertensive participants. < 130/69mm HG in hypertensive participants with diabetes, heart failure or chronic kidney disease.;Long Term: Maintenance of blood pressure at goal levels.    Lipids Yes    Intervention Provide education and support for participant on nutrition & aerobic/resistive exercise along with prescribed medications to achieve LDL 70mg , HDL >40mg .    Expected Outcomes Short Term: Participant states understanding of desired cholesterol values and is compliant with medications prescribed. Participant is following exercise prescription and nutrition guidelines.;Long Term: Cholesterol controlled with medications as prescribed, with individualized exercise RX and with personalized nutrition plan. Value goals: LDL < 70mg , HDL > 40 mg.    Stress Yes    Intervention Offer individual and/or small group education and counseling on adjustment to heart disease, stress management and health-related lifestyle change. Teach and support self-help strategies.;Refer participants experiencing significant psychosocial distress to appropriate mental health specialists for further evaluation and treatment. When possible, include family members and significant others in education/counseling sessions.    Expected Outcomes Short Term: Participant demonstrates changes in health-related behavior, relaxation and other stress management skills, ability to obtain effective social support, and compliance with psychotropic medications if prescribed.;Long Term: Emotional wellbeing is indicated by absence of clinically significant psychosocial distress or social isolation.              Core Components/Risk Factors/Patient Goals Review:   Goals and Risk Factor Review     Row Name 12/02/22 1509 12/29/22 1500           Core Components/Risk Factors/Patient Goals Review   Personal Goals Review Weight Management/Obesity;Lipids;Diabetes;Tobacco Cessation;Heart Failure;Hypertension;Stress Weight Management/Obesity;Lipids;Diabetes;Tobacco Cessation;Heart Failure;Hypertension;Stress      Review Jazya is off to a good start to exercise. Coby reports feeling lightheaded at times at home. orthostatic bp's checked. Systolic bP's in the 90's. heart failure clinic notofied about BP's. CBG's WNL. Encourage smoking cessation Yatziri returned to cardiac rehab after being absent for several sessions. Vital signs have been stable. Tyrica hjas gained 4 kg since starting cardiac rehab. Encourage smoking cessation.      Expected Outcomes Latayvia will continue to participate in cardiac rehab for exercise, nutrition and lifestyle modifications Darissa will continue to participate in cardiac rehab for exercise, nutrition and lifestyle modifications               Core Components/Risk Factors/Patient Goals at Discharge (Final Review):   Goals and Risk Factor Review - 12/29/22 1500       Core Components/Risk Factors/Patient Goals  Review   Personal Goals Review Weight Management/Obesity;Lipids;Diabetes;Tobacco Cessation;Heart Failure;Hypertension;Stress    Review Loukisha returned to cardiac rehab after being absent for several sessions. Vital signs have been stable. Gaylan hjas gained 4 kg since starting cardiac rehab. Encourage smoking cessation.    Expected Outcomes Analyn will continue to participate in cardiac rehab for exercise, nutrition and lifestyle modifications             ITP Comments:  ITP Comments     Row Name 11/16/22 0801 12/02/22 1501 12/29/22 1503       ITP Comments Dr. Armanda Magic medical director. Introduction to pritikin education/intensive cardiac rehab. Initial  orientation packet reviewed with patient. 30 Day ITP Review. Cortasia started exercise on 11/23/22. Samirah reports having pain in bilateral toenails. Marline has an appointment with the podiatrist next week and plans to hold exercise until after follow up. 30 Day ITP Review. Dalma returned to exercise exercise at cardiac rehab after being absent from several sessions. Shadae says she has been trying to get her housing situration straightened out.              Comments: See ITP Comments

## 2023-01-04 ENCOUNTER — Ambulatory Visit (HOSPITAL_COMMUNITY): Payer: Medicaid Other

## 2023-01-04 ENCOUNTER — Encounter (HOSPITAL_COMMUNITY)
Admission: RE | Admit: 2023-01-04 | Discharge: 2023-01-04 | Disposition: A | Discharge status: Home / Self Care | Payer: Medicaid Other | Source: Ambulatory Visit | Attending: Cardiology

## 2023-01-04 DIAGNOSIS — I110 Hypertensive heart disease with heart failure: Secondary | ICD-10-CM | POA: Diagnosis not present

## 2023-01-06 ENCOUNTER — Encounter (HOSPITAL_COMMUNITY): Payer: Medicaid Other

## 2023-01-06 ENCOUNTER — Ambulatory Visit (HOSPITAL_COMMUNITY): Payer: Medicaid Other

## 2023-01-09 ENCOUNTER — Encounter (HOSPITAL_COMMUNITY)
Admission: RE | Admit: 2023-01-09 | Discharge: 2023-01-09 | Disposition: A | Discharge status: Home / Self Care | Payer: Medicaid Other | Source: Ambulatory Visit | Attending: Cardiology | Admitting: Cardiology

## 2023-01-09 ENCOUNTER — Ambulatory Visit (HOSPITAL_COMMUNITY): Payer: Medicaid Other

## 2023-01-09 DIAGNOSIS — I110 Hypertensive heart disease with heart failure: Secondary | ICD-10-CM | POA: Diagnosis not present

## 2023-01-09 DIAGNOSIS — I5022 Chronic systolic (congestive) heart failure: Secondary | ICD-10-CM

## 2023-01-10 ENCOUNTER — Other Ambulatory Visit: Payer: Self-pay

## 2023-01-10 ENCOUNTER — Other Ambulatory Visit (HOSPITAL_COMMUNITY): Payer: Self-pay

## 2023-01-10 ENCOUNTER — Other Ambulatory Visit (HOSPITAL_COMMUNITY): Payer: Self-pay | Admitting: Cardiology

## 2023-01-10 MED ORDER — SPIRONOLACTONE 25 MG PO TABS
25.0000 mg | ORAL_TABLET | Freq: Every day | ORAL | 3 refills | Status: DC
  Filled 2023-01-10: qty 30, 30d supply, fill #0
  Filled 2023-02-14 (×2): qty 30, 30d supply, fill #1
  Filled 2023-03-06 – 2023-03-13 (×2): qty 30, 30d supply, fill #2
  Filled 2023-04-17 – 2023-04-24 (×2): qty 30, 30d supply, fill #3

## 2023-01-11 ENCOUNTER — Ambulatory Visit (HOSPITAL_COMMUNITY): Payer: Medicaid Other

## 2023-01-11 ENCOUNTER — Encounter (HOSPITAL_COMMUNITY)
Admission: RE | Admit: 2023-01-11 | Discharge: 2023-01-11 | Disposition: A | Discharge status: Home / Self Care | Payer: Medicaid Other | Source: Ambulatory Visit | Attending: Cardiology | Admitting: Cardiology

## 2023-01-11 DIAGNOSIS — I5022 Chronic systolic (congestive) heart failure: Secondary | ICD-10-CM | POA: Insufficient documentation

## 2023-01-11 DIAGNOSIS — Z5189 Encounter for other specified aftercare: Secondary | ICD-10-CM | POA: Diagnosis not present

## 2023-01-13 ENCOUNTER — Ambulatory Visit (HOSPITAL_COMMUNITY): Payer: Medicaid Other

## 2023-01-13 ENCOUNTER — Encounter (HOSPITAL_COMMUNITY): Payer: Medicaid Other

## 2023-01-16 ENCOUNTER — Ambulatory Visit (HOSPITAL_COMMUNITY): Payer: Medicaid Other

## 2023-01-16 ENCOUNTER — Encounter (HOSPITAL_COMMUNITY)
Admission: RE | Admit: 2023-01-16 | Discharge: 2023-01-16 | Disposition: A | Discharge status: Home / Self Care | Payer: Medicaid Other | Source: Ambulatory Visit | Attending: Cardiology | Admitting: Cardiology

## 2023-01-16 DIAGNOSIS — I5022 Chronic systolic (congestive) heart failure: Secondary | ICD-10-CM | POA: Diagnosis not present

## 2023-01-18 ENCOUNTER — Ambulatory Visit (HOSPITAL_COMMUNITY): Payer: Medicaid Other

## 2023-01-18 ENCOUNTER — Encounter (HOSPITAL_COMMUNITY)
Admission: RE | Admit: 2023-01-18 | Discharge: 2023-01-18 | Disposition: A | Discharge status: Home / Self Care | Payer: Medicaid Other | Source: Ambulatory Visit | Attending: Cardiology

## 2023-01-18 DIAGNOSIS — I5022 Chronic systolic (congestive) heart failure: Secondary | ICD-10-CM | POA: Diagnosis not present

## 2023-01-19 ENCOUNTER — Ambulatory Visit: Payer: Medicaid Other | Admitting: Podiatry

## 2023-01-20 ENCOUNTER — Ambulatory Visit (HOSPITAL_COMMUNITY): Payer: Medicaid Other

## 2023-01-20 ENCOUNTER — Encounter (HOSPITAL_COMMUNITY)
Admission: RE | Admit: 2023-01-20 | Discharge: 2023-01-20 | Disposition: A | Discharge status: Home / Self Care | Payer: Medicaid Other | Source: Ambulatory Visit | Attending: Cardiology | Admitting: Cardiology

## 2023-01-20 DIAGNOSIS — I5022 Chronic systolic (congestive) heart failure: Secondary | ICD-10-CM

## 2023-01-23 ENCOUNTER — Encounter (HOSPITAL_COMMUNITY): Payer: Medicaid Other

## 2023-01-23 ENCOUNTER — Ambulatory Visit (HOSPITAL_COMMUNITY): Payer: Medicaid Other

## 2023-01-25 ENCOUNTER — Ambulatory Visit (HOSPITAL_COMMUNITY): Payer: Medicaid Other

## 2023-01-25 ENCOUNTER — Encounter (HOSPITAL_COMMUNITY)
Admission: RE | Admit: 2023-01-25 | Discharge: 2023-01-25 | Disposition: A | Discharge status: Home / Self Care | Payer: Medicaid Other | Source: Ambulatory Visit | Attending: Cardiology | Admitting: Cardiology

## 2023-01-25 DIAGNOSIS — I5022 Chronic systolic (congestive) heart failure: Secondary | ICD-10-CM

## 2023-01-26 ENCOUNTER — Encounter (HOSPITAL_COMMUNITY): Payer: Medicaid Other

## 2023-01-27 ENCOUNTER — Ambulatory Visit (HOSPITAL_COMMUNITY): Payer: Medicaid Other

## 2023-01-27 ENCOUNTER — Encounter (HOSPITAL_COMMUNITY)
Admission: RE | Admit: 2023-01-27 | Discharge: 2023-01-27 | Disposition: A | Discharge status: Home / Self Care | Payer: Medicaid Other | Source: Ambulatory Visit | Attending: Cardiology

## 2023-01-27 DIAGNOSIS — I5022 Chronic systolic (congestive) heart failure: Secondary | ICD-10-CM | POA: Diagnosis not present

## 2023-01-30 ENCOUNTER — Ambulatory Visit (HOSPITAL_COMMUNITY): Payer: Medicaid Other

## 2023-01-30 ENCOUNTER — Encounter (HOSPITAL_COMMUNITY)
Admission: RE | Admit: 2023-01-30 | Discharge: 2023-01-30 | Disposition: A | Discharge status: Home / Self Care | Payer: Medicaid Other | Source: Ambulatory Visit | Attending: Cardiology

## 2023-01-30 DIAGNOSIS — I5022 Chronic systolic (congestive) heart failure: Secondary | ICD-10-CM | POA: Diagnosis not present

## 2023-01-31 ENCOUNTER — Encounter: Payer: Self-pay | Admitting: Podiatry

## 2023-01-31 ENCOUNTER — Ambulatory Visit (INDEPENDENT_AMBULATORY_CARE_PROVIDER_SITE_OTHER): Payer: Medicaid Other | Admitting: Podiatry

## 2023-01-31 DIAGNOSIS — D492 Neoplasm of unspecified behavior of bone, soft tissue, and skin: Secondary | ICD-10-CM

## 2023-01-31 DIAGNOSIS — E1142 Type 2 diabetes mellitus with diabetic polyneuropathy: Secondary | ICD-10-CM

## 2023-01-31 DIAGNOSIS — M216X2 Other acquired deformities of left foot: Secondary | ICD-10-CM

## 2023-01-31 NOTE — Progress Notes (Signed)
This patient returns to my office for at risk foot care.  This patient requires this care by a professional since this patient will be at risk due to having diabetes.  This patient is unable to trim her callus herself since the patient cannot reach her feet.These callus  are painful walking and wearing shoes.  This patient presents for at risk foot care today.  General Appearance  Alert, conversant and in no acute stress.  Vascular  Dorsalis pedis and posterior tibial  pulses are palpable  bilaterally.  Capillary return is within normal limits  bilaterally. Temperature is within normal limits  bilaterally.  Neurologic  Senn-Weinstein monofilament wire test within normal limits  bilaterally. Muscle power within normal limits bilaterally.  Nails Thick disfigured discolored nails with subungual debris  from hallux to fifth toes bilaterally. No evidence of bacterial infection or drainage bilaterally.  Orthopedic  No limitations of motion  feet .  No crepitus or effusions noted.  No bony pathology or digital deformities noted. Listers corn fifth  B/L.  Plantar flexed metatarsal 2.5 left foot.  Skin  normotropic skin with no porokeratosis noted bilaterally.  No signs of infections or ulcers noted.  Porokeratosis sub 2,5.  Porokeratosis 1,5 right foot.  Plantar flexed metatarsal  left foot.  Consent was obtained for treatment procedures.   ROV.  Discussed formation of skin lesions and talked with her about diabetic shoes.   Return office visit   10 weeks                  Told patient to return for periodic foot care and evaluation due to potential at risk complications.   Helane Gunther DPM

## 2023-02-01 ENCOUNTER — Ambulatory Visit (HOSPITAL_COMMUNITY): Payer: Medicaid Other

## 2023-02-01 ENCOUNTER — Ambulatory Visit: Payer: Medicaid Other | Attending: Cardiology | Admitting: Cardiology

## 2023-02-01 ENCOUNTER — Encounter (HOSPITAL_COMMUNITY)
Admission: RE | Admit: 2023-02-01 | Discharge: 2023-02-01 | Disposition: A | Discharge status: Home / Self Care | Payer: Medicaid Other | Source: Ambulatory Visit | Attending: Cardiology

## 2023-02-01 ENCOUNTER — Encounter: Payer: Self-pay | Admitting: Cardiology

## 2023-02-01 VITALS — BP 110/74 | HR 84 | Ht 65.0 in | Wt 155.6 lb

## 2023-02-01 DIAGNOSIS — I10 Essential (primary) hypertension: Secondary | ICD-10-CM

## 2023-02-01 DIAGNOSIS — I5022 Chronic systolic (congestive) heart failure: Secondary | ICD-10-CM

## 2023-02-01 NOTE — Progress Notes (Signed)
  Electrophysiology Office Note:   Date:  02/01/2023  ID:  Kendra Griffith, DOB July 25, 1965, MRN [id number]  Primary Cardiologist: None Electrophysiologist: None      History of Present Illness:   Kendra Griffith is a 57 y.o. female with h/o hypertension, diabetes, tobacco abuse, anxiety, bipolar disorder, alcohol abuse, chronic systolic heart failure seen today for  for Electrophysiology evaluation of heart failure at the request of Marca Ancona.    She was admitted the hospital May 2024 with new onset heart failure.  Ejection fraction was reduced at less than 20%.  MRI August 2024 showed an ejection fraction of 25%.  There was no delayed enhancement.  Today she feels somewhat short of breath and fatigue.  She is able to do most of her daily activities, though she has to rest more often.  We discussed the possibility of ICD therapy, but she is quite nervous about the possible implant.  Review of systems complete and found to be negative unless listed in HPI.   EP Information / Studies Reviewed:    EKG is not ordered today. EKG from 12/15/22 reviewed which showed sinus rhythm, LBBB        Risk Assessment/Calculations:              Physical Exam:   VS:  BP 110/74 (BP Location: Left Arm, Patient Position: Sitting, Cuff Size: Normal)   Pulse 84   Ht 5\' 5"  (1.651 m)   Wt 155 lb 9.6 oz (70.6 kg)   LMP 03/12/2019 (Approximate)   SpO2 97%   BMI 25.89 kg/m    Wt Readings from Last 3 Encounters:  02/01/23 155 lb 9.6 oz (70.6 kg)  01/03/23 153 lb 6.4 oz (69.6 kg)  12/15/22 148 lb (67.1 kg)     GEN: Well nourished, well developed in no acute distress NECK: No JVD; No carotid bruits CARDIAC: Regular rate and rhythm, no murmurs, rubs, gallops RESPIRATORY:  Clear to auscultation without rales, wheezing or rhonchi  ABDOMEN: Soft, non-tender, non-distended EXTREMITIES:  No edema; No deformity   ASSESSMENT AND PLAN:    1.  Chronic systolic heart failure: Due to nonischemic  cardiomyopathy.  Currently on optimal medical therapy per primary cardiology.  Ejection fraction remains persistently low.  She would benefit from ICD implant for primary prevention.  She does have left bundle branch block.  Bethani Brugger plan for CRT-D.  Risk and benefits have been discussed.  Risk of bleeding, tamponade, infection, pneumothorax, lead dislodgment, MI, renal failure, stroke, death.  Would like to discuss this further with her primary cardiologist.  Case discussed with primary cardiology  2.  Hypertension: Has been low.  Plan per primary cardiology  3.  Tobacco abuse: Complete cessation encouraged  4.  DVT: On long-term Eliquis.  Lineth Thielke need to be held at the time of device implant.  Follow up with Dr. Elberta Fortis  pending decision on ICD   Signed, Kymari Nuon Jorja Loa, MD

## 2023-02-01 NOTE — Progress Notes (Signed)
Cardiac Individual Treatment Plan  Patient Details  Name: Kendra Griffith MRN: [id number] Date of Birth: [birth year] Referring Provider:   Flowsheet Row INTENSIVE CARDIAC REHAB ORIENT from 11/16/2022 in [facility]  Referring Provider Marca Ancona, MD       Initial Encounter Date:  Flowsheet Row INTENSIVE CARDIAC REHAB ORIENT from 11/16/2022 in [facility]  Date 11/16/22       Visit Diagnosis: Heart failure, chronic systolic (HCC)  Patient's Home Medications on Admission:  Current Outpatient Medications:    acetaminophen-codeine (TYLENOL #3) 300-30 MG tablet, Take 1-2 tablets by mouth every 4 (four) hours as needed for moderate pain., Disp: 60 tablet, Rfl: 0   albuterol (VENTOLIN HFA) 108 (90 Base) MCG/ACT inhaler, Inhale 2 puffs into the lungs every 6 (six) hours as needed for wheezing or shortness of breath., Disp: 18 g, Rfl: 0   amitriptyline (ELAVIL) 75 MG tablet, Take 1 tablet (75 mg total) by mouth at bedtime for pain and depression, Disp: 90 tablet, Rfl: 1   apixaban (ELIQUIS) 5 MG TABS tablet, Take 1 tablet (5 mg total) by mouth 2 (two) times daily., Disp: 60 tablet, Rfl: 6   Biotin 5 MG CAPS, Take 5 mg by mouth 3 (three) times daily., Disp: , Rfl:    Blood Pressure Monitor DEVI, Please provide patient with insurance approved blood pressure monitor, Disp: 1 each, Rfl: 0   CARESTART COVID-19 HOME TEST KIT, See admin instructions., Disp: , Rfl:    cholecalciferol (VITAMIN D3) 25 MCG (1000 UT) tablet, Take 1,000 Units by mouth daily., Disp: , Rfl:    dapagliflozin propanediol (FARXIGA) 10 MG TABS tablet, Take 1 tablet (10 mg total) by mouth daily., Disp: 30 tablet, Rfl: 6   famotidine (PEPCID) 20 MG tablet, Take 1 tablet (20 mg total) by mouth 2 (two) times daily for heartburn, Disp: 180 tablet, Rfl: 1   gabapentin (NEURONTIN) 300 MG capsule, Take 1 capsule (300 mg  total) by mouth 2 (two) times daily., Disp: 180 capsule, Rfl: 3   hydrOXYzine (ATARAX) 50 MG tablet, Take 1 tablet (50 mg total) by mouth 3 (three) times daily as needed. Please deliver, Disp: 90 tablet, Rfl: 2   losartan (COZAAR) 25 MG tablet, Take 0.5 tablets (12.5 mg total) by mouth at bedtime., Disp: 45 tablet, Rfl: 3   Misc. Devices MISC, Please provide patient with incontinence pads or disposable underwear or choice. N39.3, Disp: 1 each, Rfl: PRN   omeprazole (PRILOSEC) 20 MG capsule, Take 1 capsule (20 mg total) by mouth daily for heartburn, Disp: 90 capsule, Rfl: 1   potassium chloride SA (KLOR-CON M) 20 MEQ tablet, Take 1 tablet (20 mEq total) by mouth daily., Disp: 30 tablet, Rfl: 3   rosuvastatin (CRESTOR) 5 MG tablet, Take 1 tablet (5 mg total) by mouth daily for cholesterol, Disp: 90 tablet, Rfl: 1   spironolactone (ALDACTONE) 25 MG tablet, Take 1 tablet (25 mg total) by mouth at bedtime., Disp: 30 tablet, Rfl: 3   spironolactone (ALDACTONE) 25 MG tablet, Take 1 tablet (25 mg total) by mouth daily., Disp: 30 tablet, Rfl: 3   torsemide (DEMADEX) 20 MG tablet, Take 1 tablet (20 mg total) by mouth daily., Disp: 30 tablet, Rfl: 3   vitamin B-12 (CYANOCOBALAMIN) 500 MCG tablet, Take 500 mcg by mouth daily., Disp: , Rfl:    vitamin C (ASCORBIC ACID) 500 MG tablet, Take 500 mg by  mouth daily., Disp: , Rfl:   Past Medical History: Past Medical History:  Diagnosis Date   ABSCESS, TOOTH 08/20/2009   Qualifier: Diagnosis of  By: Huntley Dec, Scott     Anemia    Anxiety    Bipolar disorder (HCC)    Blood transfusion without reported diagnosis    childbirth   Chronic pain    feet and back   Clotting disorder (HCC)    childbirth   Dental caries    lesion soft palate   Depression    Diabetes mellitus without complication (HCC)    prediabetes   GERD (gastroesophageal reflux disease)    Hypertension    Injury    right side from jumping off bunkbed   Pre-diabetes    Sickle cell trait  (HCC)    Wears glasses     Tobacco Use: Social History   Tobacco Use  Smoking Status Every Day   Current packs/day: 2.00   Average packs/day: 2.0 packs/day for 29.0 years (58.0 ttl pk-yrs)   Types: Cigarettes  Smokeless Tobacco Never    Labs: Review Flowsheet  More data exists      Latest Ref Rng & Units 08/27/2022 08/28/2022 08/29/2022 08/30/2022 08/31/2022  Labs for ITP Cardiac and Pulmonary Rehab  Cholestrol 0 - 200 mg/dL 77  - - - -  LDL (calc) 0 - 99 mg/dL 42  - - - -  HDL-C >69 mg/dL 20  - - - -  Trlycerides <150 mg/dL 75  - - - -  Hemoglobin A1c 4.8 - 5.6 % 6.1  - - - -  PH, Arterial 7.35 - 7.45 - - 7.453  - -  PCO2 arterial 32 - 48 mmHg - - 32.8  - -  Bicarbonate 20.0 - 28.0 mmol/L - - 28.0  28.5  23.0  - -  TCO2 22 - 32 mmol/L - - 29  30  24   - -  O2 Saturation % 80.6  56.5  67.8  91.8  57  54  93  60.2  61.5  61.4     Details       Multiple values from one day are sorted in reverse-chronological order         Capillary Blood Glucose: Lab Results  Component Value Date   GLUCAP 109 (H) 12/09/2022   GLUCAP 82 12/09/2022   GLUCAP 87 11/28/2022   GLUCAP 92 11/28/2022   GLUCAP 112 (H) 11/23/2022     Exercise Target Goals: Exercise Program Goal: Individual exercise prescription set using results from initial 6 min walk test and THRR while considering  patient's activity barriers and safety.   Exercise Prescription Goal: Initial exercise prescription builds to 30-45 minutes a day of aerobic activity, 2-3 days per week.  Home exercise guidelines will be given to patient during program as part of exercise prescription that the participant will acknowledge.  Activity Barriers & Risk Stratification:  Activity Barriers & Cardiac Risk Stratification - 11/16/22 6295       Activity Barriers & Cardiac Risk Stratification   Activity Barriers Back Problems;Deconditioning;Decreased Ventricular Function;Balance Concerns;Other (comment)    Comments neuropathy both feet     Cardiac Risk Stratification High   <5 METs on            6 Minute Walk:  6 Minute Walk     Row Name 11/16/22 1116         6 Minute Walk   Phase Initial     Distance 1180 feet  Walk Time 6 minutes     # of Rest Breaks 0     MPH 2.23     METS 3.59     RPE 7     Perceived Dyspnea  0     Symptoms Yes (comment)     Comments 10/10 bilateral feet pain-chronic. resolved with rest. used go cart     Resting HR 72 bpm     Resting BP 93/64     Resting Oxygen Saturation  100 %     Exercise Oxygen Saturation  during 6 min walk 100 %     Max Ex. HR 106 bpm     Max Ex. BP 107/73     2 Minute Post BP 95/65              Oxygen Initial Assessment:   Oxygen Re-Evaluation:   Oxygen Discharge (Final Oxygen Re-Evaluation):   Initial Exercise Prescription:  Initial Exercise Prescription - 11/16/22 1300       Date of Initial Exercise RX and Referring Provider   Date 11/16/22    Referring Provider Marca Ancona, MD    Expected Discharge Date 02/08/23      NuStep   Level 1    SPM 60    Minutes 15    METs 2      Recumbant Elliptical   Level 1    RPM 60    Watts 80    Minutes 15    METs 2.5      Prescription Details   Frequency (times per week) 3    Duration Progress to 30 minutes of continuous aerobic without signs/symptoms of physical distress      Intensity   THRR 40-80% of Max Heartrate 65-131    Ratings of Perceived Exertion 11-13    Perceived Dyspnea 0-4      Progression   Progression Continue progressive overload as per policy without signs/symptoms or physical distress.      Resistance Training   Training Prescription Yes    Weight 3    Reps 10-15             Perform Capillary Blood Glucose checks as needed.  Exercise Prescription Changes:   Exercise Prescription Changes     Row Name 11/23/22 1022 11/28/22 1021 12/09/22 1028 12/28/22 1033 01/02/23 1044     Response to Exercise   Blood Pressure (Admit) 94/56 97/62 90/60   104/58 91/51   Blood Pressure (Exercise) 110/62 94/64 105/64 118/72 86/60   Blood Pressure (Exit) 114/66 107/78 97/60 103/70 100/69   Heart Rate (Admit) 83 bpm 84 bpm 80 bpm 68 bpm 104 bpm   Heart Rate (Exercise) 102 bpm 113 bpm 101 bpm 117 bpm 119 bpm   Heart Rate (Exit) 71 bpm 93 bpm 70 bpm 76 bpm 88 bpm   Rating of Perceived Exertion (Exercise) 11 11 9 13 13    Symptoms None None None None None   Comments Off to a good start with exercise. -- -- Kendra Griffith returned to exercise today after being absent with callous on her heel. She tolerated exercise fairly well but did move from the recumbent elliptical to the track because of foot callous. Reviewed METs with Kendra Griffith. Hypotensive, asymptomatic.   Duration Continue with 30 min of aerobic exercise without signs/symptoms of physical distress. Continue with 30 min of aerobic exercise without signs/symptoms of physical distress. Progress to 30 minutes of  aerobic without signs/symptoms of physical distress Continue with 30 min of aerobic exercise without signs/symptoms of  physical distress. Continue with 30 min of aerobic exercise without signs/symptoms of physical distress.   Intensity THRR unchanged THRR unchanged THRR unchanged THRR unchanged THRR unchanged     Progression   Progression Continue to progress workloads to maintain intensity without signs/symptoms of physical distress. Continue to progress workloads to maintain intensity without signs/symptoms of physical distress. Continue to progress workloads to maintain intensity without signs/symptoms of physical distress. Continue to progress workloads to maintain intensity without signs/symptoms of physical distress. Continue to progress workloads to maintain intensity without signs/symptoms of physical distress.   Average METs 1.8 2.3 2.5 2.9 1.9     Resistance Training   Training Prescription No  Relaxation day, no weights Yes Yes Yes Yes   Weight -- 3 lbs 3 lbs 3 lbs 3 lbs   Reps -- 10-15 10-15  10-15 10-15   Time -- 10 Minutes 10 Minutes 10 Minutes 10 Minutes     Interval Training   Interval Training No No No No No     NuStep   Level 1 1 -- 1 2   SPM 69 84 -- 91 83   Minutes 15 15 -- 15 15   METs 1.8 2.3 -- 2.6 2.5     Recumbant Elliptical   Level 1 1 1 1 1    RPM -- 39 45 -- 47   Watts -- 50 51 -- 21   Minutes 15 15 15 15 15    METs -- 2.3 2.5 3.2 2.5     Home Exercise Plan   Plans to continue exercise at -- -- -- -- Home (comment)  Aerobics via Youtube, dancing, stretching   Frequency -- -- -- -- Add 4 additional days to program exercise sessions.   Initial Home Exercises Provided -- -- -- -- 12/30/22    Row Name 01/16/23 1036 01/30/23 1031           Response to Exercise   Blood Pressure (Admit) 94/58 128/64      Blood Pressure (Exercise) 98/64 --      Blood Pressure (Exit) 94/70 106/68      Heart Rate (Admit) 90 bpm 87 bpm      Heart Rate (Exercise) 117 bpm 122 bpm      Heart Rate (Exit) 99 bpm 96 bpm      Rating of Perceived Exertion (Exercise) 9 11      Symptoms None None      Comments -- Reviewed METs      Duration Continue with 30 min of aerobic exercise without signs/symptoms of physical distress. Continue with 30 min of aerobic exercise without signs/symptoms of physical distress.      Intensity THRR unchanged THRR unchanged        Progression   Progression Continue to progress workloads to maintain intensity without signs/symptoms of physical distress. Continue to progress workloads to maintain intensity without signs/symptoms of physical distress.      Average METs 3.1 2.8        Resistance Training   Training Prescription Yes Yes      Weight 3 lbs 3 lbs      Reps 10-15 10-15      Time 10 Minutes 10 Minutes        Interval Training   Interval Training No No        NuStep   Level 2 3      SPM 96 88      Minutes 15 15      METs 3 2.4  Recumbant Elliptical   Level 3 3      RPM 52 47      Watts 48 59      Minutes 15 15      METs  3.2 3.3        Home Exercise Plan   Plans to continue exercise at Home (comment)  Aerobics via Youtube, dancing, stretching Home (comment)  Aerobics via Youtube, dancing, stretching      Frequency Add 4 additional days to program exercise sessions. Add 4 additional days to program exercise sessions.      Initial Home Exercises Provided 12/30/22 12/30/22               Exercise Comments:   Exercise Comments     Row Name 11/23/22 1133 12/28/22 1126 12/30/22 1109 01/27/23 1054 01/30/23 0947   Exercise Comments Kendra Griffith tolerated low internsity exercise well without symptoms. Kendra Griffith returned to exercise today. Will progress workloads as tolerated to accomodate callous on her foot. May need to switch from the recumbent elliptical to the track. Will continue to evaluate for comfort. Reviewed home exercise guidelines and goals with Kendra Griffith. Reviewed goals with Kendra Griffith. Reviewed METs with Kendra Griffith.            Exercise Goals and Review:   Exercise Goals     Row Name 11/16/22 0802             Exercise Goals   Increase Physical Activity Yes       Intervention Provide advice, education, support and counseling about physical activity/exercise needs.;Develop an individualized exercise prescription for aerobic and resistive training based on initial evaluation findings, risk stratification, comorbidities and participant's personal goals.       Expected Outcomes Short Term: Attend rehab on a regular basis to increase amount of physical activity.;Long Term: Exercising regularly at least 3-5 days a week.;Long Term: Add in home exercise to make exercise part of routine and to increase amount of physical activity.       Increase Strength and Stamina Yes       Intervention Provide advice, education, support and counseling about physical activity/exercise needs.;Develop an individualized exercise prescription for aerobic and resistive training based on initial evaluation findings, risk stratification,  comorbidities and participant's personal goals.       Expected Outcomes Short Term: Perform resistance training exercises routinely during rehab and add in resistance training at home;Short Term: Increase workloads from initial exercise prescription for resistance, speed, and METs.;Long Term: Improve cardiorespiratory fitness, muscular endurance and strength as measured by increased METs and functional capacity ( )       Able to understand and use rate of perceived exertion (RPE) scale Yes       Intervention Provide education and explanation on how to use RPE scale       Expected Outcomes Short Term: Able to use RPE daily in rehab to express subjective intensity level;Long Term:  Able to use RPE to guide intensity level when exercising independently       Knowledge and understanding of Target Heart Rate Range (THRR) Yes       Intervention Provide education and explanation of THRR including how the numbers were predicted and where they are located for reference       Expected Outcomes Short Term: Able to state/look up THRR;Short Term: Able to use daily as guideline for intensity in rehab;Long Term: Able to use THRR to govern intensity when exercising independently       Understanding of Exercise Prescription Yes  Intervention Provide education, explanation, and written materials on patient's individual exercise prescription       Expected Outcomes Short Term: Able to explain program exercise prescription;Long Term: Able to explain home exercise prescription to exercise independently                Exercise Goals Re-Evaluation :  Exercise Goals Re-Evaluation     Row Name 11/23/22 1133 12/05/22 0814 12/28/22 1126 12/30/22 1109 01/27/23 1054     Exercise Goal Re-Evaluation   Exercise Goals Review Increase Physical Activity;Increase Strength and Stamina;Able to understand and use rate of perceived exertion (RPE) scale Increase Physical Activity;Increase Strength and Stamina;Able to  understand and use rate of perceived exertion (RPE) scale Increase Physical Activity;Increase Strength and Stamina;Able to understand and use rate of perceived exertion (RPE) scale Increase Physical Activity;Increase Strength and Stamina;Able to understand and use rate of perceived exertion (RPE) scale;Knowledge and understanding of Target Heart Rate Range (THRR);Understanding of Exercise Prescription Increase Physical Activity;Increase Strength and Stamina;Able to understand and use rate of perceived exertion (RPE) scale;Knowledge and understanding of Target Heart Rate Range (THRR);Understanding of Exercise Prescription   Comments Patient able to understand and use RPE scale appropriately. Patient havin issues with painful toenails. Exercise on hold pending appointment with podiatrist this week. Kendra Griffith returned to exercise today after being out due to foot callous. She tolerated exercise fairly well. The recumbent elliptical cause some discomfort to the callous on her heel, so she finished the station walking the track. Reviewed exercise prescription with Kendra Griffith. She is doing aerobic and dancing routines at home using YouTube videos. She would like to do more walking at home. Her goal is to stay motivated to exercise and to quit smoking. She has a smart watch but hasn't been using it to monitor her heart rate. Reviewed goals with Kendra Griffith today. She states she does well here but is stagnant at home. She wants to start walking at home. We discussed adding 30 minutes at least 1 day/week consistently at home to build a routine and add days as tolerated.   Expected Outcomes Progress workloads as tolerated to help improve strength and stamina. Will review goals upon return to exercise. Will progress workloads as tolerated. Kendra Griffith will continue daily exercise routine. Kendra Griffith will add 30 minutes walking at home at least 1 day/week in addition to exercise at cardiac rehab.            Discharge Exercise Prescription  (Final Exercise Prescription Changes):  Exercise Prescription Changes - 01/30/23 1031       Response to Exercise   Blood Pressure (Admit) 128/64    Blood Pressure (Exit) 106/68    Heart Rate (Admit) 87 bpm    Heart Rate (Exercise) 122 bpm    Heart Rate (Exit) 96 bpm    Rating of Perceived Exertion (Exercise) 11    Symptoms None    Comments Reviewed METs    Duration Continue with 30 min of aerobic exercise without signs/symptoms of physical distress.    Intensity THRR unchanged      Progression   Progression Continue to progress workloads to maintain intensity without signs/symptoms of physical distress.    Average METs 2.8      Resistance Training   Training Prescription Yes    Weight 3 lbs    Reps 10-15    Time 10 Minutes      Interval Training   Interval Training No      NuStep   Level 3    SPM  88    Minutes 15    METs 2.4      Recumbant Elliptical   Level 3    RPM 47    Watts 59    Minutes 15    METs 3.3      Home Exercise Plan   Plans to continue exercise at Home (comment)   Aerobics via Youtube, dancing, stretching   Frequency Add 4 additional days to program exercise sessions.    Initial Home Exercises Provided 12/30/22             Nutrition:  Target Goals: Understanding of nutrition guidelines, daily intake of sodium 1500mg , cholesterol 200mg , calories 30% from fat and 7% or less from saturated fats, daily to have 5 or more servings of fruits and vegetables.  Biometrics:  Pre Biometrics - 11/16/22 0837       Pre Biometrics   Waist Circumference 34 inches    Hip Circumference 37 inches    Waist to Hip Ratio 0.92 %    Triceps Skinfold 36 mm    % Body Fat 37.6 %    Grip Strength 22 kg    Flexibility 0 in   not done due to low back pain   Single Leg Stand 11.06 seconds              Nutrition Therapy Plan and Nutrition Goals:  Nutrition Therapy & Goals - 01/25/23 1121       Nutrition Therapy   Diet Heart Healthy Diet    Drug/Food  Interactions Statins/Certain Fruits      Personal Nutrition Goals   Nutrition Goal Patient to identify strategies for reducing cardiovascular risk by attending the [employer] education and nutrition series weekly.   goal in progress.   Personal Goal #2 Patient to improve diet quality by using the plate method as a guide for meal planning to include lean protein/plant protein, fruits, vegetables, whole grains, nonfat dairy as part of a well-balanced diet.   goal in progress.   Personal Goal #3 Patient to reduce sodium intake to 2000mg  per day   goal in progress.   Comments Goals in progress. Tyleisha contineus to attend the [employer] and nutrition series regularly. Aleaya reported motivation to reduce sugar intake (sugary coffee drinks) and continue to improve eating habits. She did also report food insecurity and difficulty getting to the grocery store; our nursing staff and the social worker [facility] continue to work on getting resources to her. She does enjoy a wide variety of foods including fish and fruits/vegetables. She has improved understanding of benefits of increased fruit/vegetable intake, fiber recommenations, and reading labels for sodium. Per documentation, she is up 7.9# since her orientation weight but she has maintained weight over the last 30 days.  Patient will benefit from participation in intensive cardiac rehab for nutrition, exercise, and lifestyle modification. Will continue to assess goals upon improved attendance.      Intervention Plan   Intervention Prescribe, educate and counsel regarding individualized specific dietary modifications aiming towards targeted core components such as weight, hypertension, lipid management, diabetes, heart failure and other comorbidities.;Nutrition handout(s) given to patient.    Expected Outcomes Short Term Goal: Understand basic principles of dietary content, such as calories, fat, sodium, cholesterol and  nutrients.;Long Term Goal: Adherence to prescribed nutrition plan.             Nutrition Assessments:  MEDIFICTS Score Key: >=70 Need to make dietary changes  40-70 Heart Healthy Diet <=  40 Therapeutic Level Cholesterol Diet    Picture Your Plate Scores: <78 Unhealthy dietary pattern with much room for improvement. 41-50 Dietary pattern unlikely to meet recommendations for good health and room for improvement. 51-60 More healthful dietary pattern, with some room for improvement.  >60 Healthy dietary pattern, although there may be some specific behaviors that could be improved.    Nutrition Goals Re-Evaluation:  Nutrition Goals Re-Evaluation     Row Name 11/23/22 1613 12/26/22 1007 01/25/23 1121         Goals   Current Weight 150 lb 9.2 oz (68.3 kg) 151 lb 10.8 oz (68.8 kg)  weight from last attended session on 12/09/22 156 lb 12 oz (71.1 kg)     Comment lipids WNL, HDL 20, A1c 6.1, Cr 1.09, GFR 60 no new labs; most recent labs  lipids WNL, HDL 20, A1c 6.1, Cr 1.09, GFR 60 no new labs; most recent labs lipids WNL, HDL 20, A1c 6.1, Cr 1.09, GFR 60, BNP WNL     Expected Outcome Kendra Griffith reports motivation to reduce sugar intake (sugary coffee drinks) and continue to improve eating habits. She does report food insecurity and difficulty getting to the grocery store; our nursing staff and the social worker at [facility] continue to work on getting resources to her. She does enjoy a wide variety of foods including fish and fruits/vegetables. Patient will benefit from participation in intensive cardiac rehab for nutrition, exercise, and lifestyle modification. Goals not met at this time. Kendra Griffith has not attended Intensive cardiac rehab since 8/30; she has attended 3 exercise sessions and 3 education sessions. Prior to absences, Kendra Griffith reported motivation to reduce sugar intake (sugary coffee drinks) and continue to improve eating habits. She did also report food insecurity and  difficulty getting to the grocery store; our nursing staff and the social worker at [facility] continue to work on getting resources to her. She does enjoy a wide variety of foods including fish and fruits/vegetables. Patient will benefit from participation in intensive cardiac rehab for nutrition, exercise, and lifestyle modification. Will continue to assess goals upon improved attendance. Goals in progress. Tawn contineus to attend the [employer] and nutrition series regularly. Yeila reported motivation to reduce sugar intake (sugary coffee drinks) and continue to improve eating habits. She did also report food insecurity and difficulty getting to the grocery store; our nursing staff and the social worker at the [facility] continue to work on getting resources to her. She does enjoy a wide variety of foods including fish and fruits/vegetables. She has improved understanding of benefits of increased fruit/vegetable intake, fiber recommenations, and reading labels for sodium. Per documentation, she is up 7.9# since her orientation weight but she has maintained weight over the last 30 days. Patient will benefit from participation in intensive cardiac rehab for nutrition, exercise, and lifestyle modification. Will continue to assess goals upon improved attendance.              Nutrition Goals Re-Evaluation:  Nutrition Goals Re-Evaluation     Row Name 11/23/22 1613 12/26/22 1007 01/25/23 1121         Goals   Current Weight 150 lb 9.2 oz (68.3 kg) 151 lb 10.8 oz (68.8 kg)  weight from last attended session on 12/09/22 156 lb 12 oz (71.1 kg)     Comment lipids WNL, HDL 20, A1c 6.1, Cr 1.09, GFR 60 no new labs; most recent labs  lipids WNL, HDL 20, A1c 6.1,  Cr 1.09, GFR 60 no new labs; most recent labs lipids WNL, HDL 20, A1c 6.1, Cr 1.09, GFR 60, BNP WNL     Expected Outcome Maudelle reports motivation to reduce sugar intake (sugary coffee drinks) and continue to improve  eating habits. She does report food insecurity and difficulty getting to the grocery store; our nursing staff and [facility] continue to work on getting resources to her. She does enjoy a wide variety of foods including fish and fruits/vegetables. Patient will benefit from participation in intensive cardiac rehab for nutrition, exercise, and lifestyle modification. Goals not met at this time. Mylo has not attended Intensive cardiac rehab since 8/30; she has attended 3 exercise sessions and 3 education sessions. Prior to absences, Kendra Griffith reported motivation to reduce sugar intake (sugary coffee drinks) and continue to improve eating habits. She did also report food insecurity and difficulty getting to the grocery store; our nursing staff and the social worker at [facility] continue to work on getting resources to her. She does enjoy a wide variety of foods including fish and fruits/vegetables. Patient will benefit from participation in intensive cardiac rehab for nutrition, exercise, and lifestyle modification. Will continue to assess goals upon improved attendance. Goals in progress. Sanaiah contineus to attend the [employer] and nutrition series regularly. Dajour reported motivation to reduce sugar intake (sugary coffee drinks) and continue to improve eating habits. She did also report food insecurity and difficulty getting to the grocery store; our nursing staff and the social worker at [facility] continue to work on getting resources to her. She does enjoy a wide variety of foods including fish and fruits/vegetables. She has improved understanding of benefits of increased fruit/vegetable intake, fiber recommenations, and reading labels for sodium. Per documentation, she is up 7.9# since her orientation weight but she has maintained weight over the last 30 days. Patient will benefit from participation in intensive cardiac rehab for nutrition,  exercise, and lifestyle modification. Will continue to assess goals upon improved attendance.              Nutrition Goals Discharge (Final Nutrition Goals Re-Evaluation):  Nutrition Goals Re-Evaluation - 01/25/23 1121       Goals   Current Weight 156 lb 12 oz (71.1 kg)    Comment no new labs; most recent labs lipids WNL, HDL 20, A1c 6.1, Cr 1.09, GFR 60, BNP WNL    Expected Outcome Goals in progress. Lua contineus to attend the [employer] and nutrition series regularly. Lasheena reported motivation to reduce sugar intake (sugary coffee drinks) and continue to improve eating habits. She did also report food insecurity and difficulty getting to the grocery store; our nursing staff and the social worker at [facility] continue to work on getting resources to her. She does enjoy a wide variety of foods including fish and fruits/vegetables. She has improved understanding of benefits of increased fruit/vegetable intake, fiber recommenations, and reading labels for sodium. Per documentation, she is up 7.9# since her orientation weight but she has maintained weight over the last 30 days. Patient will benefit from participation in intensive cardiac rehab for nutrition, exercise, and lifestyle modification. Will continue to assess goals upon improved attendance.             Psychosocial: Target Goals: Acknowledge presence or absence of significant depression and/or stress, maximize coping skills, provide positive support system. Participant is able to verbalize types and ability to use techniques  and skills needed for reducing stress and depression.  Initial Review & Psychosocial Screening:  Initial Psych Review & Screening - 11/16/22 1030       Initial Review   Current issues with Current Depression;Current Anxiety/Panic;Current Sleep Concerns;Current Stress Concerns    Source of Stress Concerns Chronic Illness;Financial    Comments Yma shared that she has low levels  of depression and anxiety currently. She has a long history of depression and is connected with several resources through behavioral health and social work. She denies any further need for resources at this time and is trying to get reconnected with counseling through behavioral health.      Family Dynamics   Good Support System? Yes   Claresa has her children for support     Barriers   Psychosocial barriers to participate in program The patient should benefit from training in stress management and relaxation.      Screening Interventions   Interventions Encouraged to exercise;Provide feedback about the scores to participant;To provide support and resources with identified psychosocial needs    Expected Outcomes Short Term goal: Utilizing psychosocial counselor, staff and physician to assist with identification of specific Stressors or current issues interfering with healing process. Setting desired goal for each stressor or current issue identified.;Long Term Goal: Stressors or current issues are controlled or eliminated.;Short Term goal: Identification and review with participant of any Quality of Life or Depression concerns found by scoring the questionnaire.;Long Term goal: The participant improves quality of Life and PHQ9 Scores as seen by post scores and/or verbalization of changes             Quality of Life Scores:  Quality of Life - 11/16/22 1117       Quality of Life   Select Quality of Life      Quality of Life Scores   Health/Function Pre 11.2 %    Socioeconomic Pre 7.5 %    Psych/Spiritual Pre 24 %    Family Pre 22.8 %    GLOBAL Pre 14.57 %            Scores of 19 and below usually indicate a poorer quality of life in these areas.  A difference of  2-3 points is a clinically meaningful difference.  A difference of 2-3 points in the total score of the Quality of Life Index has been associated with significant improvement in overall quality of life, self-image, physical  symptoms, and general health in studies assessing change in quality of life.  PHQ-9: Review Flowsheet  More data exists      01/03/2023 11/16/2022 10/11/2022 08/17/2022 05/18/2022  Depression screen PHQ 2/9  Decreased Interest 1 2 1 2 1   Down, Depressed, Hopeless 1 2 2 2 1   PHQ - 2 Score 2 4 3 4 2   Altered sleeping 1 2 1 2 1   Tired, decreased energy 1 2 1 2 1   Change in appetite 1 2 1 2 1   Feeling bad or failure about yourself  1 2 1 2 1   Trouble concentrating 1 2 2 2 1   Moving slowly or fidgety/restless 1 2 3 2 1   Suicidal thoughts 1 1 0 2 1  PHQ-9 Score 9 17 12 18 9   Difficult doing work/chores - Not difficult at all - - -    Details           Interpretation of Total Score  Total Score Depression Severity:  1-4 = Minimal depression, 5-9 = Mild depression, 10-14 = Moderate  depression, 15-19 = Moderately severe depression, 20-27 = Severe depression   Psychosocial Evaluation and Intervention:   Psychosocial Re-Evaluation:  Psychosocial Re-Evaluation     Row Name 12/02/22 1504 12/29/22 1451 01/31/23 0750         Psychosocial Re-Evaluation   Current issues with Current Sleep Concerns;Current Stress Concerns;Current Depression;Current Anxiety/Panic Current Sleep Concerns;Current Stress Concerns;Current Depression;Current Anxiety/Panic Current Sleep Concerns;Current Stress Concerns;Current Depression;Current Anxiety/Panic     Comments Will review quality of life and PHQ2-9 upon return to exercise. Quality of life and PHQ2-9 reviewed.Jessicaanne says her chest pain shortness of breath is getting better. Kimberlynn says she is not as depressed as when she first started cardiac rehab.Marland Kitchen Terilee says the antidepressant she is taking is controlling her depression. Monalisa says she is getting counseling from [facility]. Trinice has not voiced any increased concerns or stressors during exercise at cardiac rehab.Marland Kitchen Donisha says the antidepressant she is taking is controlling her depression. Breonah says she is getting  counseling from [facility].     Expected Outcomes Angilee will have controlled or decreased depression/ stressors  upon completion of cardiac rehab Delmi will have controlled or decreased depression/ stressors  upon completion of cardiac rehab Sharesse will have controlled or decreased depression/ stressors  upon completion of cardiac rehab     Interventions Stress management education;Encouraged to attend Cardiac Rehabilitation for the exercise;Relaxation education Stress management education;Encouraged to attend Cardiac Rehabilitation for the exercise;Relaxation education Stress management education;Encouraged to attend Cardiac Rehabilitation for the exercise;Relaxation education     Continue Psychosocial Services  Follow up required by staff Follow up required by staff Follow up required by staff       Initial Review   Source of Stress Concerns Chronic Illness;Unable to participate in former interests or hobbies;Unable to perform yard/household activities;Financial Chronic Illness;Unable to participate in former interests or hobbies;Unable to perform yard/household activities;Financial Chronic Illness;Unable to participate in former interests or hobbies;Unable to perform yard/household activities;Financial     Comments will continue to monitor and offer support as needed will continue to monitor and offer support as needed will continue to monitor and offer support as needed              Psychosocial Discharge (Final Psychosocial Re-Evaluation):  Psychosocial Re-Evaluation - 01/31/23 0750       Psychosocial Re-Evaluation   Current issues with Current Sleep Concerns;Current Stress Concerns;Current Depression;Current Anxiety/Panic    Comments Kimla has not voiced any increased concerns or stressors during exercise at cardiac rehab.Marland Kitchen Aran says the antidepressant she is taking is controlling her depression. Aubreyella says she is getting counseling from [facility].    Expected Outcomes Michella will have  controlled or decreased depression/ stressors  upon completion of cardiac rehab    Interventions Stress management education;Encouraged to attend Cardiac Rehabilitation for the exercise;Relaxation education    Continue Psychosocial Services  Follow up required by staff      Initial Review   Source of Stress Concerns Chronic Illness;Unable to participate in former interests or hobbies;Unable to perform yard/household activities;Financial    Comments will continue to monitor and offer support as needed             Vocational Rehabilitation: Provide vocational rehab assistance to qualifying candidates.   Vocational Rehab Evaluation & Intervention:  Vocational Rehab - 11/16/22 1027       Initial Vocational Rehab Evaluation & Intervention   Assessment shows need for Vocational Rehabilitation No   Lauranne is on disability  Education: Education Goals: Education classes will be provided on a weekly basis, covering required topics. Participant will state understanding/return demonstration of topics presented.    Education     Row Name 11/23/22 1500     Education   Cardiac Education Topics Pritikin   Orthoptist   Educator Dietitian   Weekly Topic Powerhouse Plant-Based Proteins   Instruction Review Code 1- Verbalizes Understanding   Class Start Time 1145   Class Stop Time 1228   Class Time Calculation (min) 43 min    Row Name 11/28/22 1100     Education   Cardiac Education Topics Pritikin   Geographical information systems officer Psychosocial   Psychosocial Workshop From Head to Heart: The Power of a Healthy Outlook   Instruction Review Code 1- Verbalizes Understanding   Class Start Time 1155   Class Stop Time 1245   Class Time Calculation (min) 50 min    Row Name 12/09/22 1300     Education   Cardiac Education Topics Pritikin   Nurse, children's   Educator Nurse    Select Psychosocial   Psychosocial Healthy Minds, Bodies, Hearts   Instruction Review Code 1- Verbalizes Understanding   Class Start Time 1145   Class Stop Time 1222   Class Time Calculation (min) 37 min    Row Name 12/28/22 1400     Education   Cardiac Education Topics Pritikin   Orthoptist   Educator Dietitian   Weekly Topic Tasty Appetizers and Snacks   Instruction Review Code 1- Verbalizes Understanding   Class Start Time 1145   Class Stop Time 1225   Class Time Calculation (min) 40 min    Row Name 12/30/22 1400     Education   Cardiac Education Topics Pritikin   Nurse, children's Exercise Physiologist   Select Nutrition   Nutrition Calorie Density   Instruction Review Code 1- Verbalizes Understanding   Class Start Time 1155   Class Stop Time 1235   Class Time Calculation (min) 40 min    Row Name 01/02/23 1300     Education   Cardiac Education Topics Pritikin   [employer]     Workshops   Educator Exercise Physiologist   Select Exercise   Exercise Workshop Exercise Basics: Building Your Action Plan   Instruction Review Code 1- Verbalizes Understanding   Class Start Time 1155   Class Stop Time 1237   Class Time Calculation (min) 42 min    Row Name 01/04/23 1500     Education   Cardiac Education Topics Pritikin   Customer service manager   Weekly Topic Efficiency Cooking - Meals in a Snap   Instruction Review Code 2- Demonstrated Understanding   Class Start Time 1145   Class Stop Time 1224   Class Time Calculation (min) 39 min    Row Name 01/09/23 1300     Education   Cardiac Education Topics Pritikin   Glass blower/designer Nutrition   Nutrition Workshop Targeting Your Nutrition Priorities   Instruction Review Code 1- Verbalizes Understanding   Class Start Time 1145   Class Stop Time 1225   Class  Time Calculation (min) 40 min    Row Name 01/11/23 1200     Education   Cardiac Education Topics Pritikin   Orthoptist   Educator Dietitian   Weekly Topic One-Pot Wonders   Instruction Review Code 1- Verbalizes Understanding   Class Start Time 1145   Class Stop Time 1225   Class Time Calculation (min) 40 min    Row Name 01/16/23 1300     Education   Cardiac Education Topics Pritikin   Licensed conveyancer Nutrition   Nutrition Dining Out - Part 1   Instruction Review Code 1- Verbalizes Understanding   Class Start Time 1145   Class Stop Time 1230   Class Time Calculation (min) 45 min    Row Name 01/18/23 1500     Education   Cardiac Education Topics Pritikin   Customer service manager   Weekly Topic Comforting Weekend Breakfasts   Instruction Review Code 1- Verbalizes Understanding   Class Start Time 1145   Class Stop Time 1223   Class Time Calculation (min) 38 min    Row Name 01/20/23 1500     Education   Cardiac Education Topics Pritikin   Geographical information systems officer Psychosocial   Psychosocial Workshop Focused Goals, Sustainable Changes   Instruction Review Code 1- Verbalizes Understanding   Class Start Time 1145   Class Stop Time 1230   Class Time Calculation (min) 45 min    Row Name 01/25/23 1500     Education   Cardiac Education Topics Pritikin   Customer service manager   Weekly Topic Fast Evening Meals   Instruction Review Code 1- Verbalizes Understanding   Class Start Time 1145   Class Stop Time 1230   Class Time Calculation (min) 45 min    Row Name 01/27/23 1400     Education   Cardiac Education Topics Pritikin   Licensed conveyancer Nutrition   Nutrition Vitamins and Minerals   Instruction  Review Code 1- Verbalizes Understanding   Class Start Time 1145   Class Stop Time 1226   Class Time Calculation (min) 41 min    Row Name 01/30/23 1300     Education   Cardiac Education Topics Pritikin   Glass blower/designer Nutrition   Nutrition Workshop Fueling a Forensic psychologist   Instruction Review Code 1- Teaching laboratory technician Start Time 1145   Class Stop Time 1230   Class Time Calculation (min) 45 min            Core Videos: Exercise    Move It!  Clinical staff conducted group or individual video education with verbal and written material and guidebook.  Patient learns the recommended Pritikin exercise program. Exercise with the goal of living a long, healthy life. Some of the health benefits of exercise include controlled diabetes, healthier blood pressure levels, improved cholesterol levels, improved heart and lung capacity, improved sleep, and better body composition. Everyone should speak with their doctor before starting or changing an exercise routine.  Biomechanical Limitations Clinical staff conducted group or individual video  education with verbal and written material and guidebook.  Patient learns how biomechanical limitations can impact exercise and how we can mitigate and possibly overcome limitations to have an impactful and balanced exercise routine.  Body Composition Clinical staff conducted group or individual video education with verbal and written material and guidebook.  Patient learns that body composition (ratio of muscle mass to fat mass) is a key component to assessing overall fitness, rather than body weight alone. Increased fat mass, especially visceral belly fat, can put [place] at increased risk for metabolic syndrome, type 2 diabetes, heart disease, and even death. It is recommended to combine diet and exercise (cardiovascular and resistance training) to improve your body composition. Seek guidance from  your physician and exercise physiologist before implementing an exercise routine.  Exercise Action Plan Clinical staff conducted group or individual video education with verbal and written material and guidebook.  Patient learns the recommended strategies to achieve and enjoy long-term exercise adherence, including variety, self-motivation, self-efficacy, and positive decision making. Benefits of exercise include fitness, good health, weight management, more energy, better sleep, less stress, and overall well-being.  Medical   Heart Disease Risk Reduction Clinical staff conducted group or individual video education with verbal and written material and guidebook.  Patient learns our heart is our most vital organ as it circulates oxygen, nutrients, white blood cells, and hormones throughout the entire body, and carries waste away. Data supports a plant-based eating plan like the Pritikin Program for its effectiveness in slowing progression of and reversing heart disease. The video provides a number of recommendations to address heart disease.   Metabolic Syndrome and Belly Fat  Clinical staff conducted group or individual video education with verbal and written material and guidebook.  Patient learns what metabolic syndrome is, how it leads to heart disease, and how one can reverse it and keep it from coming back. You have metabolic syndrome if you have 3 of the following 5 criteria: abdominal obesity, high blood pressure, high triglycerides, low HDL cholesterol, and high blood sugar.  Hypertension and Heart Disease Clinical staff conducted group or individual video education with verbal and written material and guidebook.  Patient learns that high blood pressure, or hypertension, is very common in the [place]. Hypertension is largely due to excessive salt intake, but other important risk factors include being overweight, physical inactivity, drinking too much alcohol, smoking, and not eating  enough potassium from fruits and vegetables. High blood pressure is a leading risk factor for heart attack, stroke, congestive heart failure, dementia, kidney failure, and premature death. Long-term effects of excessive salt intake include stiffening of the arteries and thickening of heart muscle and organ damage. Recommendations include ways to reduce hypertension and the risk of heart disease.  Diseases of Our Time - Focusing on Diabetes Clinical staff conducted group or individual video education with verbal and written material and guidebook.  Patient learns why the best way to stop diseases of our time is prevention, through food and other lifestyle changes. Medicine (such as prescription pills and surgeries) is often only a Band-Aid on the problem, not a long-term solution. Most common diseases of our time include obesity, type 2 diabetes, hypertension, heart disease, and cancer. The Pritikin Program is recommended and has been proven to help reduce, reverse, and/or prevent the damaging effects of metabolic syndrome.  Nutrition   Overview of the Pritikin Eating Plan  Clinical staff conducted group or individual video education with verbal and written material and guidebook.  Patient learns  about the Pritikin Eating Plan for disease risk reduction. The Pritikin Eating Plan emphasizes a wide variety of unrefined, minimally-processed carbohydrates, like fruits, vegetables, whole grains, and legumes. Go, Caution, and Stop food choices are explained. Plant-based and lean animal proteins are emphasized. Rationale provided for low sodium intake for blood pressure control, low added sugars for blood sugar stabilization, and low added fats and oils for coronary artery disease risk reduction and weight management.  Calorie Density  Clinical staff conducted group or individual video education with verbal and written material and guidebook.  Patient learns about calorie density and how it impacts the Pritikin  Eating Plan. Knowing the characteristics of the food you choose will help you decide whether those foods will lead to weight gain or weight loss, and whether you want to consume more or less of them. Weight loss is usually a side effect of the Pritikin Eating Plan because of its focus on low calorie-dense foods.  Label Reading  Clinical staff conducted group or individual video education with verbal and written material and guidebook.  Patient learns about the Pritikin recommended label reading guidelines and corresponding recommendations regarding calorie density, added sugars, sodium content, and whole grains.  Dining Out - Part 1  Clinical staff conducted group or individual video education with verbal and written material and guidebook.  Patient learns that restaurant meals can be sabotaging because they can be so high in calories, fat, sodium, and/or sugar. Patient learns recommended strategies on how to positively address this and avoid unhealthy pitfalls.  Facts on Fats  Clinical staff conducted group or individual video education with verbal and written material and guidebook.  Patient learns that lifestyle modifications can be just as effective, if not more so, as many medications for lowering your risk of heart disease. A Pritikin lifestyle can help to reduce your risk of inflammation and atherosclerosis (cholesterol build-up, or plaque, in the artery walls). Lifestyle interventions such as dietary choices and physical activity address the cause of atherosclerosis. A review of the types of fats and their impact on blood cholesterol levels, along with dietary recommendations to reduce fat intake is also included.  Nutrition Action Plan  Clinical staff conducted group or individual video education with verbal and written material and guidebook.  Patient learns how to incorporate Pritikin recommendations into their lifestyle. Recommendations include planning and keeping personal health goals  in mind as an important part of their success.  Healthy Mind-Set    Healthy Minds, Bodies, Hearts  Clinical staff conducted group or individual video education with verbal and written material and guidebook.  Patient learns how to identify when they are stressed. Video will discuss the impact of that stress, as well as the many benefits of stress management. Patient will also be introduced to stress management techniques. The way we think, act, and feel has an impact on our hearts.  How Our Thoughts Can Heal Our Hearts  Clinical staff conducted group or individual video education with verbal and written material and guidebook.  Patient learns that negative thoughts can cause depression and anxiety. This can result in negative lifestyle behavior and serious health problems. Cognitive behavioral therapy is an effective method to help control our thoughts in order to change and improve our emotional outlook.  Additional Videos:  Exercise    Improving Performance  Clinical staff conducted group or individual video education with verbal and written material and guidebook.  Patient learns to use a non-linear approach by alternating intensity levels and lengths of time  spent exercising to help burn more calories and lose more body fat. Cardiovascular exercise helps improve heart health, metabolism, hormonal balance, blood sugar control, and recovery from fatigue. Resistance training improves strength, endurance, balance, coordination, reaction time, metabolism, and muscle mass. Flexibility exercise improves circulation, posture, and balance. Seek guidance from your physician and exercise physiologist before implementing an exercise routine and learn your capabilities and proper form for all exercise.  Introduction to Yoga  Clinical staff conducted group or individual video education with verbal and written material and guidebook.  Patient learns about yoga, a discipline of the coming together of mind,  breath, and body. The benefits of yoga include improved flexibility, improved range of motion, better posture and core strength, increased lung function, weight loss, and positive self-image. Yoga's heart health benefits include lowered blood pressure, healthier heart rate, decreased cholesterol and triglyceride levels, improved immune function, and reduced stress. Seek guidance from your physician and exercise physiologist before implementing an exercise routine and learn your capabilities and proper form for all exercise.  Medical   Aging: Enhancing Your Quality of Life  Clinical staff conducted group or individual video education with verbal and written material and guidebook.  Patient learns key strategies and recommendations to stay in good physical health and enhance quality of life, such as prevention strategies, having an advocate, securing a Health Care Proxy and Power of Attorney, and keeping a list of medications and system for tracking them. It also discusses how to avoid risk for bone loss.  Biology of Weight Control  Clinical staff conducted group or individual video education with verbal and written material and guidebook.  Patient learns that weight gain occurs because we consume more calories than we burn (eating more, moving less). Even if your body weight is normal, you may have higher ratios of fat compared to muscle mass. Too much body fat puts you at increased risk for cardiovascular disease, heart attack, stroke, type 2 diabetes, and obesity-related cancers. In addition to exercise, following the Pritikin Eating Plan can help reduce your risk.  Decoding Lab Results  Clinical staff conducted group or individual video education with verbal and written material and guidebook.  Patient learns that lab test reflects one measurement whose values change over time and are influenced by many factors, including medication, stress, sleep, exercise, food, hydration, pre-existing medical  conditions, and more. It is recommended to use the knowledge from this video to become more involved with your lab results and evaluate your numbers to speak with your doctor.   Diseases of Our Time - Overview  Clinical staff conducted group or individual video education with verbal and written material and guidebook.  Patient learns that according to the CDC, 50% to 70% of chronic diseases (such as obesity, type 2 diabetes, elevated lipids, hypertension, and heart disease) are avoidable through lifestyle improvements including healthier food choices, listening to satiety cues, and increased physical activity.  Sleep Disorders Clinical staff conducted group or individual video education with verbal and written material and guidebook.  Patient learns how good quality and duration of sleep are important to overall health and well-being. Patient also learns about sleep disorders and how they impact health along with recommendations to address them, including discussing with a physician.  Nutrition  Dining Out - Part 2 Clinical staff conducted group or individual video education with verbal and written material and guidebook.  Patient learns how to plan ahead and communicate in order to maximize their dining experience in a healthy and nutritious manner.  Included are recommended food choices based on the type of restaurant the patient is visiting.   Fueling a Banker conducted group or individual video education with verbal and written material and guidebook.  There is a strong connection between our food choices and our health. Diseases like obesity and type 2 diabetes are very prevalent and are in large-part due to lifestyle choices. The Pritikin Eating Plan provides plenty of food and hunger-curbing satisfaction. It is easy to follow, affordable, and helps reduce health risks.  Menu Workshop  Clinical staff conducted group or individual video education with verbal and written  material and guidebook.  Patient learns that restaurant meals can sabotage health goals because they are often packed with calories, fat, sodium, and sugar. Recommendations include strategies to plan ahead and to communicate with the manager, chef, or server to help order a healthier meal.  Planning Your Eating Strategy  Clinical staff conducted group or individual video education with verbal and written material and guidebook.  Patient learns about the Pritikin Eating Plan and its benefit of reducing the risk of disease. The Pritikin Eating Plan does not focus on calories. Instead, it emphasizes high-quality, nutrient-rich foods. By knowing the characteristics of the foods, we choose, we can determine their calorie density and make informed decisions.  Targeting Your Nutrition Priorities  Clinical staff conducted group or individual video education with verbal and written material and guidebook.  Patient learns that lifestyle habits have a tremendous impact on disease risk and progression. This video provides eating and physical activity recommendations based on your personal health goals, such as reducing LDL cholesterol, losing weight, preventing or controlling type 2 diabetes, and reducing high blood pressure.  Vitamins and Minerals  Clinical staff conducted group or individual video education with verbal and written material and guidebook.  Patient learns different ways to obtain key vitamins and minerals, including through a recommended healthy diet. It is important to discuss all supplements you take with your doctor.   Healthy Mind-Set    Smoking Cessation  Clinical staff conducted group or individual video education with verbal and written material and guidebook.  Patient learns that cigarette smoking and tobacco addiction pose a serious health risk which affects millions of people. Stopping smoking will significantly reduce the risk of heart disease, lung disease, and many forms of  cancer. Recommended strategies for quitting are covered, including working with your doctor to develop a successful plan.  Culinary   Becoming a Set designer conducted group or individual video education with verbal and written material and guidebook.  Patient learns that cooking at home can be healthy, cost-effective, quick, and puts them in control. Keys to cooking healthy recipes will include looking at your recipe, assessing your equipment needs, planning ahead, making it simple, choosing cost-effective seasonal ingredients, and limiting the use of added fats, salts, and sugars.  Cooking - Breakfast and Snacks  Clinical staff conducted group or individual video education with verbal and written material and guidebook.  Patient learns how important breakfast is to satiety and nutrition through the entire day. Recommendations include key foods to eat during breakfast to help stabilize blood sugar levels and to prevent overeating at meals later in the day. Planning ahead is also a key component.  Cooking - Educational psychologist conducted group or individual video education with verbal and written material and guidebook.  Patient learns eating strategies to improve overall health, including an approach to cook more at  home. Recommendations include thinking of animal protein as a side on your plate rather than center stage and focusing instead on lower calorie dense options like vegetables, fruits, whole grains, and plant-based proteins, such as beans. Making sauces in large quantities to freeze for later and leaving the skin on your vegetables are also recommended to maximize your experience.  Cooking - Healthy Salads and Dressing Clinical staff conducted group or individual video education with verbal and written material and guidebook.  Patient learns that vegetables, fruits, whole grains, and legumes are the foundations of the Pritikin Eating Plan. Recommendations  include how to incorporate each of these in flavorful and healthy salads, and how to create homemade salad dressings. Proper handling of ingredients is also covered. Cooking - Soups and [employer] - Soups and Desserts Clinical staff conducted group or individual video education with verbal and written material and guidebook.  Patient learns that Pritikin soups and desserts make for easy, nutritious, and delicious snacks and meal components that are low in sodium, fat, sugar, and calorie density, while high in vitamins, minerals, and filling fiber. Recommendations include simple and healthy ideas for soups and desserts.   Overview     The Pritikin Solution Program Overview Clinical staff conducted group or individual video education with verbal and written material and guidebook.  Patient learns that the results of the Pritikin Program have been documented in more than 100 articles published in peer-reviewed journals, and the benefits include reducing risk factors for (and, in some cases, even reversing) high cholesterol, high blood pressure, type 2 diabetes, obesity, and more! An overview of the three key pillars of the Pritikin Program will be covered: eating well, doing regular exercise, and having a healthy mind-set.  WORKSHOPS  Exercise: Exercise Basics: Building Your Action Plan Clinical staff led group instruction and group discussion with PowerPoint presentation and patient guidebook. To enhance the learning environment the use of posters, models and videos may be added. At the conclusion of this workshop, patients will comprehend the difference between physical activity and exercise, as well as the benefits of incorporating both, into their routine. Patients will understand the FITT (Frequency, Intensity, Time, and Type) principle and how to use it to build an exercise action plan. In addition, safety concerns and other considerations for exercise and cardiac rehab will be addressed  by the presenter. The purpose of this lesson is to promote a comprehensive and effective weekly exercise routine in order to improve patients' overall level of fitness.   Managing Heart Disease: Your Path to a Healthier Heart Clinical staff led group instruction and group discussion with PowerPoint presentation and patient guidebook. To enhance the learning environment the use of posters, models and videos may be added.At the conclusion of this workshop, patients will understand the anatomy and physiology of the heart. Additionally, they will understand how Pritikin's three pillars impact the risk factors, the progression, and the management of heart disease.  The purpose of this lesson is to provide a high-level overview of the heart, heart disease, and how the Pritikin lifestyle positively impacts risk factors.  Exercise Biomechanics Clinical staff led group instruction and group discussion with PowerPoint presentation and patient guidebook. To enhance the learning environment the use of posters, models and videos may be added. Patients will learn how the structural parts of their bodies function and how these functions impact their daily activities, movement, and exercise. Patients will learn how to promote a neutral spine, learn how to manage pain, and identify ways  to improve their physical movement in order to promote healthy living. The purpose of this lesson is to expose patients to common physical limitations that impact physical activity. Participants will learn practical ways to adapt and manage aches and pains, and to minimize their effect on regular exercise. Patients will learn how to maintain good posture while sitting, walking, and lifting.  Balance Training and Fall Prevention  Clinical staff led group instruction and group discussion with PowerPoint presentation and patient guidebook. To enhance the learning environment the use of posters, models and videos may be added. At  the conclusion of this workshop, patients will understand the importance of their sensorimotor skills (vision, proprioception, and the vestibular system) in maintaining their ability to balance as they age. Patients will apply a variety of balancing exercises that are appropriate for their current level of function. Patients will understand the common causes for poor balance, possible solutions to these problems, and ways to modify their physical environment in order to minimize their fall risk. The purpose of this lesson is to teach patients about the importance of maintaining balance as they age and ways to minimize their risk of falling.  WORKSHOPS   Nutrition:  Fueling a Ship broker led group instruction and group discussion with PowerPoint presentation and patient guidebook. To enhance the learning environment the use of posters, models and videos may be added. Patients will review the foundational principles of the Pritikin Eating Plan and understand what constitutes a serving size in each of the food groups. Patients will also learn Pritikin-friendly foods that are better choices when away from home and review make-ahead meal and snack options. Calorie density will be reviewed and applied to three nutrition priorities: weight maintenance, weight loss, and weight gain. The purpose of this lesson is to reinforce (in a group setting) the key concepts around what patients are recommended to eat and how to apply these guidelines when away from home by planning and selecting Pritikin-friendly options. Patients will understand how calorie density may be adjusted for different weight management goals.  Mindful Eating  Clinical staff led group instruction and group discussion with PowerPoint presentation and patient guidebook. To enhance the learning environment the use of posters, models and videos may be added. Patients will briefly review the concepts of the Pritikin Eating Plan and the  importance of low-calorie dense foods. The concept of mindful eating will be introduced as well as the importance of paying attention to internal hunger signals. Triggers for non-hunger eating and techniques for dealing with triggers will be explored. The purpose of this lesson is to provide patients with the opportunity to review the basic principles of the Pritikin Eating Plan, discuss the value of eating mindfully and how to measure internal cues of hunger and fullness using the Hunger Scale. Patients will also discuss reasons for non-hunger eating and learn strategies to use for controlling emotional eating.  Targeting Your Nutrition Priorities Clinical staff led group instruction and group discussion with PowerPoint presentation and patient guidebook. To enhance the learning environment the use of posters, models and videos may be added. Patients will learn how to determine their genetic susceptibility to disease by reviewing their family history. Patients will gain insight into the importance of diet as part of an overall healthy lifestyle in mitigating the impact of genetics and other environmental insults. The purpose of this lesson is to provide patients with the opportunity to assess their personal nutrition priorities by looking at their family history, their own health  history and current risk factors. Patients will also be able to discuss ways of prioritizing and modifying the Pritikin Eating Plan for their highest risk areas  Menu  Clinical staff led group instruction and group discussion with PowerPoint presentation and patient guidebook. To enhance the learning environment the use of posters, models and videos may be added. Using menus brought in from [employer], or printed from [employer], patients will apply the Pritikin dining out guidelines that were presented in the [employer] video. Patients will also be able to practice these guidelines in a variety of  provided scenarios. The purpose of this lesson is to provide patients with the opportunity to practice hands-on learning of the Pritikin Dining Out guidelines with actual menus and practice scenarios.  Label Reading Clinical staff led group instruction and group discussion with PowerPoint presentation and patient guidebook. To enhance the learning environment the use of posters, models and videos may be added. Patients will review and discuss the Pritikin label reading guidelines presented in Pritikin's Label Reading Educational series video. Using fool labels brought in from local grocery stores and markets, patients will apply the label reading guidelines and determine if the packaged food meet the Pritikin guidelines. The purpose of this lesson is to provide patients with the opportunity to review, discuss, and practice hands-on learning of the Pritikin Label Reading guidelines with actual packaged food labels. Cooking School  Pritikin's [employer] are designed to teach patients ways to prepare quick, simple, and affordable recipes at home. The importance of nutrition's role in chronic disease risk reduction is reflected in its emphasis in the overall Pritikin program. By learning how to prepare essential core Pritikin Eating Plan recipes, patients will increase control over what they eat; be able to customize the flavor of foods without the use of added salt, sugar, or fat; and improve the quality of the food they consume. By learning a set of core recipes which are easily assembled, quickly prepared, and affordable, patients are more likely to prepare more healthy foods at home. These workshops focus on convenient breakfasts, simple entres, side dishes, and desserts which can be prepared with minimal effort and are consistent with nutrition recommendations for cardiovascular risk reduction. Cooking [employer] are taught by a Armed forces logistics/support/administrative officer (RD) who has been trained by the  [employer]. The chef or RD has a clear understanding of the importance of minimizing - if not completely eliminating - added fat, sugar, and sodium in recipes. Throughout the series of Cooking School Workshop sessions, patients will learn about healthy ingredients and efficient methods of cooking to build confidence in their capability to prepare    Cooking School weekly topics:  Adding Flavor- Sodium-Free  Fast and Healthy Breakfasts  Powerhouse Plant-Based Proteins  Satisfying Salads and Dressings  Simple Sides and Sauces  International Cuisine-Spotlight on the [employer] Zones  Delicious Desserts  Savory Soups  [employer] - Meals in a Astronomer Appetizers and Snacks  Comforting Weekend Breakfasts  One-Pot Wonders   Fast Evening Meals  Landscape architect Your Pritikin Plate  WORKSHOPS   Healthy Mindset (Psychosocial):  Focused Goals, Sustainable Changes Clinical staff led group instruction and group discussion with PowerPoint presentation and patient guidebook. To enhance the learning environment the use of posters, models and videos may be added. Patients will be able to apply effective goal setting strategies to establish at least one personal goal, and then take consistent, meaningful action toward that  goal. They will learn to identify common barriers to achieving personal goals and develop strategies to overcome them. Patients will also gain an understanding of how our mind-set can impact our ability to achieve goals and the importance of cultivating a positive and growth-oriented mind-set. The purpose of this lesson is to provide patients with a deeper understanding of how to set and achieve personal goals, as well as the tools and strategies needed to overcome common obstacles which may arise along the way.  From Head to Heart: The Power of a Healthy Outlook  Clinical staff led group instruction and group discussion with PowerPoint presentation  and patient guidebook. To enhance the learning environment the use of posters, models and videos may be added. Patients will be able to recognize and describe the impact of emotions and mood on physical health. They will discover the importance of self-care and explore self-care practices which may work for them. Patients will also learn how to utilize the 4 C's to cultivate a healthier outlook and better manage stress and challenges. The purpose of this lesson is to demonstrate to patients how a healthy outlook is an essential part of maintaining good health, especially as they continue their cardiac rehab journey.  Healthy Sleep for a Healthy Heart Clinical staff led group instruction and group discussion with PowerPoint presentation and patient guidebook. To enhance the learning environment the use of posters, models and videos may be added. At the conclusion of this workshop, patients will be able to demonstrate knowledge of the importance of sleep to overall health, well-being, and quality of life. They will understand the symptoms of, and treatments for, common sleep disorders. Patients will also be able to identify daytime and nighttime behaviors which impact sleep, and they will be able to apply these tools to help manage sleep-related challenges. The purpose of this lesson is to provide patients with a general overview of sleep and outline the importance of quality sleep. Patients will learn about a few of the most common sleep disorders. Patients will also be introduced to the concept of "sleep hygiene," and discover ways to self-manage certain sleeping problems through simple daily behavior changes. Finally, the workshop will motivate patients by clarifying the links between quality sleep and their goals of heart-healthy living.   Recognizing and Reducing Stress Clinical staff led group instruction and group discussion with PowerPoint presentation and patient guidebook. To enhance the learning  environment the use of posters, models and videos may be added. At the conclusion of this workshop, patients will be able to understand the types of stress reactions, differentiate between acute and chronic stress, and recognize the impact that chronic stress has on their health. They will also be able to apply different coping mechanisms, such as reframing negative self-talk. Patients will have the opportunity to practice a variety of stress management techniques, such as deep abdominal breathing, progressive muscle relaxation, and/or guided imagery.  The purpose of this lesson is to educate patients on the role of stress in their lives and to provide healthy techniques for coping with it.  Learning Barriers/Preferences:  Learning Barriers/Preferences - 11/16/22 3244       Learning Barriers/Preferences   Learning Barriers Sight   reading glasses   Learning Preferences Audio;Computer/Internet;Group Instruction;Individual Instruction;Pictoral;Skilled Demonstration;Verbal Instruction;Written Material;Video             Education Topics:  Knowledge Questionnaire Score:  Knowledge Questionnaire Score - 11/16/22 0923       Knowledge Questionnaire Score   Pre Score 21/28  Core Components/Risk Factors/Patient Goals at Admission:  Personal Goals and Risk Factors at Admission - 11/16/22 0802       Core Components/Risk Factors/Patient Goals on Admission    Weight Management Yes    Intervention Weight Management: Provide education and appropriate resources to help participant work on and attain dietary goals.;Weight Management: Develop a combined nutrition and exercise program designed to reach desired caloric intake, while maintaining appropriate intake of nutrient and fiber, sodium and fats, and appropriate energy expenditure required for the weight goal.    Expected Outcomes Short Term: Continue to assess and modify interventions until short term weight is achieved;Long Term:  Adherence to nutrition and physical activity/exercise program aimed toward attainment of established weight goal;Understanding recommendations for meals to include 15-35% energy as protein, 25-35% energy from fat, 35-60% energy from carbohydrates, less than 200mg  of dietary cholesterol, 20-35 gm of total fiber daily;Understanding of distribution of calorie intake throughout the day with the consumption of 4-5 meals/snacks    Tobacco Cessation Yes    Number of packs per day 1-2    Intervention Assist the participant in steps to quit. Provide individualized education and counseling about committing to Tobacco Cessation, relapse prevention, and pharmacological support that can be provided by physician.;Education officer, environmental, assist with locating and accessing local/national Quit Smoking programs, and support quit date choice.    Expected Outcomes Short Term: Will demonstrate readiness to quit, by selecting a quit date.;Long Term: Complete abstinence from all tobacco products for at least 12 months from quit date.;Short Term: Will quit all tobacco product use, adhering to prevention of relapse plan.    Diabetes Yes    Intervention Provide education about proper nutrition, including hydration, and aerobic/resistive exercise prescription along with prescribed medications to achieve blood glucose in normal ranges: Fasting glucose 65-99 mg/dL;Provide education about signs/symptoms and action to take for hypo/hyperglycemia.    Expected Outcomes Short Term: Participant verbalizes understanding of the signs/symptoms and immediate care of hyper/hypoglycemia, proper foot care and importance of medication, aerobic/resistive exercise and nutrition plan for blood glucose control.;Long Term: Attainment of HbA1C < 7%.    Heart Failure Yes    Intervention Provide a combined exercise and nutrition program that is supplemented with education, support and counseling about heart failure. Directed toward relieving symptoms  such as shortness of breath, decreased exercise tolerance, and extremity edema.    Expected Outcomes Improve functional capacity of life;Short term: Attendance in program 2-3 days a week with increased exercise capacity. Reported lower sodium intake. Reported increased fruit and vegetable intake. Reports medication compliance.;Short term: Daily weights obtained and reported for increase. Utilizing diuretic protocols set by physician.;Long term: Adoption of self-care skills and reduction of barriers for early signs and symptoms recognition and intervention leading to self-care maintenance.    Hypertension Yes    Intervention Provide education on lifestyle modifcations including regular physical activity/exercise, weight management, moderate sodium restriction and increased consumption of fresh fruit, vegetables, and low fat dairy, alcohol moderation, and smoking cessation.;Monitor prescription use compliance.    Expected Outcomes Short Term: Continued assessment and intervention until BP is < 140/50mm HG in hypertensive participants. < 130/72mm HG in hypertensive participants with diabetes, heart failure or chronic kidney disease.;Long Term: Maintenance of blood pressure at goal levels.    Lipids Yes    Intervention Provide education and support for participant on nutrition & aerobic/resistive exercise along with prescribed medications to achieve LDL 70mg , HDL >40mg .    Expected Outcomes Short Term: Participant states understanding of desired cholesterol  values and is compliant with medications prescribed. Participant is following exercise prescription and nutrition guidelines.;Long Term: Cholesterol controlled with medications as prescribed, with individualized exercise RX and with personalized nutrition plan. Value goals: LDL < 70mg , HDL > 40 mg.    Stress Yes    Intervention Offer individual and/or small group education and counseling on adjustment to heart disease, stress management and health-related  lifestyle change. Teach and support self-help strategies.;Refer participants experiencing significant psychosocial distress to appropriate mental health specialists for further evaluation and treatment. When possible, include family members and significant others in education/counseling sessions.    Expected Outcomes Short Term: Participant demonstrates changes in health-related behavior, relaxation and other stress management skills, ability to obtain effective social support, and compliance with psychotropic medications if prescribed.;Long Term: Emotional wellbeing is indicated by absence of clinically significant psychosocial distress or social isolation.             Core Components/Risk Factors/Patient Goals Review:   Goals and Risk Factor Review     Row Name 12/02/22 1509 12/29/22 1500 01/31/23 0754         Core Components/Risk Factors/Patient Goals Review   Personal Goals Review Weight Management/Obesity;Lipids;Diabetes;Tobacco Cessation;Heart Failure;Hypertension;Stress Weight Management/Obesity;Lipids;Diabetes;Tobacco Cessation;Heart Failure;Hypertension;Stress Weight Management/Obesity;Lipids;Diabetes;Tobacco Cessation;Heart Failure;Hypertension;Stress     Review Kendra Griffith is off to a good start to exercise. Kendra Griffith reports feeling lightheaded at times at home. orthostatic bp's checked. Systolic bP's in the 90's. heart failure clinic notofied about BP's. CBG's WNL. Encourage smoking cessation Kendra Griffith returned to cardiac rehab after being absent for several sessions. Vital signs have been stable. Tango hjas gained 4 kg since starting cardiac rehab. Encourage smoking cessation. Kendra Griffith has been doing well with exercise at cardiac rehab. Vital signs have been stable. Kendra Griffith hjas gained 3.7 kg since starting cardiac rehab. Continue to  Encourage smoking cessation.     Expected Outcomes Kendra Griffith will continue to participate in cardiac rehab for exercise, nutrition and lifestyle modifications Kendra Griffith  will continue to participate in cardiac rehab for exercise, nutrition and lifestyle modifications Kendra Griffith will continue to participate in cardiac rehab for exercise, nutrition and lifestyle modifications              Core Components/Risk Factors/Patient Goals at Discharge (Final Review):   Goals and Risk Factor Review - 01/31/23 0754       Core Components/Risk Factors/Patient Goals Review   Personal Goals Review Weight Management/Obesity;Lipids;Diabetes;Tobacco Cessation;Heart Failure;Hypertension;Stress    Review Kendra Griffith has been doing well with exercise at cardiac rehab. Vital signs have been stable. Kendra Griffith hjas gained 3.7 kg since starting cardiac rehab. Continue to  Encourage smoking cessation.    Expected Outcomes Kendra Griffith will continue to participate in cardiac rehab for exercise, nutrition and lifestyle modifications             ITP Comments:  ITP Comments     Row Name 11/16/22 0801 12/02/22 1501 12/29/22 1503 01/31/23 0748     ITP Comments Dr. Armanda Magic medical director. Introduction to pritikin education/intensive cardiac rehab. Initial orientation packet reviewed with patient. 30 Day ITP Review. Gale started exercise on 11/23/22. Elahni reports having pain in bilateral toenails. Cherrie has an appointment with the podiatrist next week and plans to hold exercise until after follow up. 30 Day ITP Review. Aariona returned to exercise exercise at cardiac rehab after being absent from several sessions. Catlyn says she has been trying to get her housing situration straightened out. 30 Day ITP Review. Gerturde has good attendance and participation in cardiac rehab  Comments: See ITP Comments

## 2023-02-02 ENCOUNTER — Other Ambulatory Visit (HOSPITAL_COMMUNITY): Payer: Self-pay | Admitting: Cardiology

## 2023-02-03 ENCOUNTER — Encounter (HOSPITAL_COMMUNITY)
Admission: RE | Admit: 2023-02-03 | Discharge: 2023-02-03 | Disposition: A | Discharge status: Home / Self Care | Payer: Medicaid Other | Source: Ambulatory Visit | Attending: Cardiology

## 2023-02-03 DIAGNOSIS — I5022 Chronic systolic (congestive) heart failure: Secondary | ICD-10-CM | POA: Diagnosis not present

## 2023-02-06 ENCOUNTER — Encounter (HOSPITAL_COMMUNITY): Payer: Medicaid Other

## 2023-02-08 ENCOUNTER — Encounter (HOSPITAL_COMMUNITY)
Admission: RE | Admit: 2023-02-08 | Discharge: 2023-02-08 | Disposition: A | Discharge status: Home / Self Care | Payer: Medicaid Other | Source: Ambulatory Visit | Attending: Cardiology | Admitting: Cardiology

## 2023-02-08 DIAGNOSIS — I5022 Chronic systolic (congestive) heart failure: Secondary | ICD-10-CM

## 2023-02-10 ENCOUNTER — Encounter (HOSPITAL_COMMUNITY): Payer: Medicaid Other

## 2023-02-13 ENCOUNTER — Encounter (HOSPITAL_COMMUNITY): Payer: Medicaid Other

## 2023-02-14 ENCOUNTER — Other Ambulatory Visit: Payer: Self-pay

## 2023-02-14 ENCOUNTER — Ambulatory Visit (HOSPITAL_COMMUNITY)
Admission: RE | Admit: 2023-02-14 | Discharge: 2023-02-14 | Disposition: A | Discharge status: Home / Self Care | Payer: Medicaid Other | Source: Ambulatory Visit | Attending: Family Medicine

## 2023-02-14 ENCOUNTER — Other Ambulatory Visit (HOSPITAL_COMMUNITY): Payer: Self-pay

## 2023-02-14 ENCOUNTER — Encounter: Payer: Self-pay | Admitting: Cardiology

## 2023-02-14 ENCOUNTER — Encounter (HOSPITAL_COMMUNITY): Payer: Self-pay

## 2023-02-14 ENCOUNTER — Telehealth: Payer: Self-pay | Admitting: Cardiology

## 2023-02-14 VITALS — BP 96/64 | HR 86 | Wt 158.8 lb

## 2023-02-14 DIAGNOSIS — I10 Essential (primary) hypertension: Secondary | ICD-10-CM | POA: Diagnosis not present

## 2023-02-14 DIAGNOSIS — M542 Cervicalgia: Secondary | ICD-10-CM | POA: Diagnosis not present

## 2023-02-14 DIAGNOSIS — F319 Bipolar disorder, unspecified: Secondary | ICD-10-CM | POA: Insufficient documentation

## 2023-02-14 DIAGNOSIS — F17210 Nicotine dependence, cigarettes, uncomplicated: Secondary | ICD-10-CM | POA: Insufficient documentation

## 2023-02-14 DIAGNOSIS — F419 Anxiety disorder, unspecified: Secondary | ICD-10-CM | POA: Insufficient documentation

## 2023-02-14 DIAGNOSIS — E119 Type 2 diabetes mellitus without complications: Secondary | ICD-10-CM | POA: Diagnosis not present

## 2023-02-14 DIAGNOSIS — F1011 Alcohol abuse, in remission: Secondary | ICD-10-CM | POA: Insufficient documentation

## 2023-02-14 DIAGNOSIS — I428 Other cardiomyopathies: Secondary | ICD-10-CM | POA: Insufficient documentation

## 2023-02-14 DIAGNOSIS — R9431 Abnormal electrocardiogram [ECG] [EKG]: Secondary | ICD-10-CM | POA: Diagnosis not present

## 2023-02-14 DIAGNOSIS — Z86718 Personal history of other venous thrombosis and embolism: Secondary | ICD-10-CM | POA: Diagnosis present

## 2023-02-14 DIAGNOSIS — I493 Ventricular premature depolarization: Secondary | ICD-10-CM | POA: Diagnosis not present

## 2023-02-14 DIAGNOSIS — Z720 Tobacco use: Secondary | ICD-10-CM

## 2023-02-14 DIAGNOSIS — Z7901 Long term (current) use of anticoagulants: Secondary | ICD-10-CM | POA: Insufficient documentation

## 2023-02-14 DIAGNOSIS — I110 Hypertensive heart disease with heart failure: Secondary | ICD-10-CM | POA: Insufficient documentation

## 2023-02-14 DIAGNOSIS — I5022 Chronic systolic (congestive) heart failure: Secondary | ICD-10-CM | POA: Diagnosis not present

## 2023-02-14 DIAGNOSIS — D509 Iron deficiency anemia, unspecified: Secondary | ICD-10-CM | POA: Diagnosis not present

## 2023-02-14 DIAGNOSIS — I82532 Chronic embolism and thrombosis of left popliteal vein: Secondary | ICD-10-CM

## 2023-02-14 LAB — BASIC METABOLIC PANEL
Anion gap: 7 (ref 5–15)
BUN: 12 mg/dL (ref 6–20)
CO2: 33 mmol/L — ABNORMAL HIGH (ref 22–32)
Calcium: 9.8 mg/dL (ref 8.9–10.3)
Chloride: 100 mmol/L (ref 98–111)
Creatinine, Ser: 1.07 mg/dL — ABNORMAL HIGH (ref 0.44–1.00)
GFR, Estimated: >60 mL/min (ref >60)
Glucose, Bld: 92 mg/dL (ref 70–99)
Potassium: 3.9 mmol/L (ref 3.5–5.1)
Sodium: 140 mmol/L (ref 135–145)

## 2023-02-14 LAB — BRAIN NATRIURETIC PEPTIDE: B Natriuretic Peptide: 37.4 pg/mL (ref 0.0–100.0)

## 2023-02-14 MED ORDER — TORSEMIDE 20 MG PO TABS
40.0000 mg | ORAL_TABLET | Freq: Every day | ORAL | 6 refills | Status: DC
  Filled 2023-02-14 (×2): qty 60, 30d supply, fill #0
  Filled 2023-03-06 – 2023-03-13 (×2): qty 60, 30d supply, fill #1
  Filled 2023-05-26: qty 60, 30d supply, fill #2

## 2023-02-14 MED ORDER — NICOTINE 14 MG/24HR TD PT24
14.0000 mg | MEDICATED_PATCH | Freq: Every day | TRANSDERMAL | 0 refills | Status: AC
Start: 2023-02-14 — End: 2023-03-28
  Filled 2023-02-14 (×2): qty 28, 28d supply, fill #0

## 2023-02-14 MED ORDER — POTASSIUM CHLORIDE CRYS ER 20 MEQ PO TBCR
40.0000 meq | EXTENDED_RELEASE_TABLET | Freq: Every day | ORAL | 6 refills | Status: DC
  Filled 2023-02-14 (×2): qty 60, 30d supply, fill #0
  Filled 2023-03-06 – 2023-03-13 (×2): qty 60, 30d supply, fill #1
  Filled 2023-05-26: qty 60, 30d supply, fill #2

## 2023-02-14 MED ORDER — NICOTINE 7 MG/24HR TD PT24
7.0000 mg | MEDICATED_PATCH | Freq: Every day | TRANSDERMAL | 0 refills | Status: DC
  Filled 2023-02-14: qty 14, 14d supply, fill #0

## 2023-02-14 NOTE — Addendum Note (Signed)
Encounter addended by: Demetrius Charity, RN on: 02/14/2023 12:02 PM  Actions taken: Charge Capture section accepted

## 2023-02-14 NOTE — Progress Notes (Signed)
ReDS Vest / Clip - 02/14/23 0800       ReDS Vest / Clip   Station Marker A    Ruler Value 24    ReDS Value Range High volume overload    ReDS Actual Value 41

## 2023-02-14 NOTE — Patient Instructions (Addendum)
Thank you for coming in today  If you had labs drawn today, any labs that are abnormal the clinic will call you No news is good news  Medications: Increase potassium to 40 meq 2 tablets daily  Increase torsemide 40 mg 2 tablets daily   Nicotine patch 14 mg for 6 weeks then 7 mg for 2 weeks  You have been referred to genetic counseling , their office will call you for further appointment details    Follow up appointments:  Your physician recommends that you return for lab work in: 10-14 days bmet   Your physician recommends that you schedule a follow-up appointment in:  4 week in clinic  3-4 months With Dr. Earlean Shawl will receive a reminder letter in the mail a few months in advance. If you don't receive a letter, please call our office to schedule the follow-up appointment.    Do the following things EVERYDAY: Weigh yourself in the morning before breakfast. Write it down and keep it in a log. Take your medicines as prescribed Eat low salt foods--Limit salt (sodium) to 2000 mg per day.  Stay as active as you can everyday Limit all fluids for the day to less than 2 liters   At the [facility], you and your health needs are our priority. As part of our continuing mission to provide you with exceptional heart care, we have created designated Provider Care Teams. These Care Teams include your primary Cardiologist (physician) and Advanced Practice Providers (APPs- Physician Assistants and Nurse Practitioners) who all work together to provide you with the care you need, when you need it.   You may see any of the following providers on your designated Care Team at your next follow up: Dr Arvilla Meres Dr Marca Ancona Dr. Marcos Eke, NP Robbie Lis, [place] [facility] [place], [place] Brynda Peon, NP Karle Plumber, PharmD   Please be sure to bring in all your medications bottles to every appointment.    Thank you for choosing [facility]  If you have any questions or concerns before your next appointment please send [place] a message through [place] or call our office at [phone].    TO LEAVE A MESSAGE FOR THE NURSE SELECT OPTION 2, PLEASE LEAVE A MESSAGE INCLUDING: YOUR NAME DATE OF BIRTH CALL BACK NUMBER REASON FOR CALL**this is important as we prioritize the call backs  YOU WILL RECEIVE A CALL BACK THE SAME DAY AS LONG AS YOU CALL BEFORE 4:00 PM

## 2023-02-14 NOTE — Progress Notes (Signed)
[facility] Note    PCP: Claiborne Rigg, NP HF Cardiologist: Dr. Shirlee Latch  57 y.o. AAF with past medical history of HTN, DM, tobacco use, anxiety, bipolar disorder, a remote history of ETOH abuse, and diagnosis of systolic heart failure and LE DVT in 5/24.   Admitted 5/24 with new acute systolic heart failure. Echo showed EF <20%, GIIIDD, RV mod reduced, mod elevated PASP ~58, LA mod dilated, RA mildly dilated, mild MR, mod TR. Hs trops negative. She was diuresed w/ IV Lasix and started on GDMT. Heparin started for LE DVT. V/Q scan showed no perfusion defects to suggest PE. Hypercoagulable workup was negative. R/LHC showed normal coronary arteries, elevated filling pressures with low cardiac output (RA 6, PA 47/24/33, PA sat 57%, Fick CI 2.1). She was diuresed further w/ IV Lasix and transitioned to PO torsemide. GDMT further titrated. Switched to Eliquis after completion of cath.   Cardiac MRI in 8/24 showed LV EF 25%, RV EF 36%, no delayed enhancement.    Echo 9/24 EF 25% with septal-lateral dyssynchrony, mild RV dysfunction, and IVC normal.   Today she returns for HF follow up. Overall feeling fine. She has SOB walking up steps, doing CR and able to finish classes. She feels palpitations. Denies abnormal bleeding, CP, dizziness, edema, or PND/Orthopnea. Appetite ok. No fever or chills. Weight at home 156 pounds. Taking all medications. Smoking 1/2 ppd. Has neck pain/swelling, she is unsure if she slept on it wrong.  ReDs: 41%  ECG (personally reviewed): NSR with occasional PVCs  Labs (7/24): K 4.6, creatinine 1.07 => 1.09, BNP 77 Labs (9/24): K 4.0, creatinine 1.10, BNP 50    PMH: 1. DVT: DVT in 1980.  Left lower leg DVT in 5/24.  - Prothrombin gene mutation negative, factor V Leiden negative, lupus anticoagulant negative 2. Type 2 diabetes 3. GERD 4. HTN 5. Sickle cell trait 6. Active smoker 7. Prior ETOH abuse.  8. Bipolar disorder 9. Chronic systolic CHF:  Nonischemic cardiomyopathy.  Diagnosis by echo in 5/24 showing EF < 20%, grade III DD, RV moderately reduced, RVSP 58 mmHg, mild MR, moderate TR.  - R/LHC (5/24): normal coronaries; RA 6, PA 47/24 (33), PCWP 23, CO/CI (Fick) 3.4/2.1, PVR 2.6 WU.  - Cardiac MRI (8/24): LVEF 25%, RVEF 36%, no delayed enhancement.   - Echo (9/24): EF 25% with septal-lateral dyssynchrony, mild RV dysfunction, and IVC normal.   FH: Mother with CHF, ? History of rheumatic heart disease.  Sister with CHF.   Current Outpatient Medications  Medication Sig Dispense Refill   acetaminophen-codeine (TYLENOL #3) 300-30 MG tablet Take 1-2 tablets by mouth every 4 (four) hours as needed for moderate pain. 60 tablet 0   albuterol (VENTOLIN HFA) 108 (90 Base) MCG/ACT inhaler Inhale 2 puffs into the lungs every 6 (six) hours as needed for wheezing or shortness of breath. 18 g 0   amitriptyline (ELAVIL) 75 MG tablet Take 1 tablet (75 mg total) by mouth at bedtime for pain and depression 90 tablet 1   apixaban (ELIQUIS) 5 MG TABS tablet Take 1 tablet (5 mg total) by mouth 2 (two) times daily. 60 tablet 6   Biotin 5 MG CAPS Take 5 mg by mouth 3 (three) times daily.     Blood Pressure Monitor DEVI Please provide patient with insurance approved blood pressure monitor 1 each 0   cholecalciferol (VITAMIN D3) 25 MCG (1000 UT) tablet Take 1,000 Units by mouth daily.     dapagliflozin propanediol (FARXIGA)  10 MG TABS tablet Take 1 tablet (10 mg total) by mouth daily. 30 tablet 6   famotidine (PEPCID) 20 MG tablet Take 1 tablet (20 mg total) by mouth 2 (two) times daily for heartburn 180 tablet 1   gabapentin (NEURONTIN) 300 MG capsule Take 1 capsule (300 mg total) by mouth 2 (two) times daily. 180 capsule 3   hydrOXYzine (ATARAX) 50 MG tablet Take 1 tablet (50 mg total) by mouth 3 (three) times daily as needed. Please deliver 90 tablet 2   losartan (COZAAR) 25 MG tablet Take 0.5 tablets (12.5 mg total) by mouth at bedtime. 45 tablet 3    Misc. Devices MISC Please provide patient with incontinence pads or disposable underwear or choice. N39.3 1 each PRN   omeprazole (PRILOSEC) 20 MG capsule Take 1 capsule (20 mg total) by mouth daily for heartburn 90 capsule 1   potassium chloride SA (KLOR-CON M) 20 MEQ tablet Take 1 tablet (20 mEq total) by mouth daily. 30 tablet 3   rosuvastatin (CRESTOR) 5 MG tablet Take 1 tablet (5 mg total) by mouth daily for cholesterol 90 tablet 1   spironolactone (ALDACTONE) 25 MG tablet Take 1 tablet (25 mg total) by mouth daily. 30 tablet 3   torsemide (DEMADEX) 20 MG tablet Take 1 tablet (20 mg total) by mouth daily. 30 tablet 3   vitamin B-12 (CYANOCOBALAMIN) 500 MCG tablet Take 500 mcg by mouth daily.     vitamin C (ASCORBIC ACID) 500 MG tablet Take 500 mg by mouth daily.     No current facility-administered medications for this encounter.   Allergies  Allergen Reactions   Amoxicillin     Pain Did it involve swelling of the face/tongue/throat, SOB, or low BP? No Did it involve sudden or severe rash/hives, skin peeling, or any reaction on the inside of your mouth or nose? No Did you need to seek medical attention at a hospital or doctor's office? Yes When did it last happen?      5 years ago If all above answers are "NO", may proceed with cephalosporin use.    Darvocet [Propoxyphene N-Acetaminophen] Nausea And Vomiting    Makes her jittery   Hydrocodone-Acetaminophen Nausea And Vomiting    Upset stomach   Percocet [Oxycodone-Acetaminophen]     Makes her jittery   Valium [Diazepam] Other (See Comments)    Makes pt. Feel "out of wack"   Social History   Socioeconomic History   Marital status: Single    Spouse name: Not on file   Number of children: 3   Years of education: 8-9   Highest education level: 8th grade  Occupational History   Occupation: applying for disability  Tobacco Use   Smoking status: Every Day    Current packs/day: 2.00    Average packs/day: 2.0 packs/day for 29.0  years (58.0 ttl pk-yrs)    Types: Cigarettes   Smokeless tobacco: Never  Vaping Use   Vaping status: Never Used  Substance and Sexual Activity   Alcohol use: No    Alcohol/week: 0.0 standard drinks of alcohol    Comment: Stopped drinking 2013   Drug use: No   Sexual activity: Not Currently    Birth control/protection: None  Other Topics Concern   Not on file  Social History Narrative   Patient lives alone in an apartment on the first floor.  3 children.  Applying for disability.  Education: 8th or 9th grade.   Currently not working   Trying to go back  to school online for GED   Six Grandchildren   Social Determinants of Health   Financial Resource Strain: Medium Risk (09/02/2022)   Overall Financial Resource Strain (CARDIA)    Difficulty of Paying Living Expenses: Somewhat hard  Food Insecurity: Food Insecurity Present (10/26/2022)   Hunger Vital Sign    Worried About Running Out of Food in the Last Year: Sometimes true    Ran Out of Food in the Last Year: Sometimes true  Transportation Needs: Unmet Transportation Needs (11/25/2022)   PRAPARE - Administrator, Civil Service (Medical): No    Lack of Transportation (Non-Medical): Yes  Physical Activity: Insufficiently Active (09/02/2022)   Exercise Vital Sign    Days of Exercise per Week: 1 day    Minutes of Exercise per Session: 30 min  Stress: Stress Concern Present (09/02/2022)   [employer] of Occupational Health - Occupational Stress Questionnaire    Feeling of Stress : To some extent  Social Connections: Moderately Isolated (09/02/2022)   Social Connection and Isolation Panel [NHANES]    Frequency of Communication with Friends and Family: Three times a week    Frequency of Social Gatherings with Friends and Family: Once a week    Attends Religious Services: Never    Database administrator or Organizations: Yes    Attends Banker Meetings: 1 to 4 times per year    Marital Status: Divorced   Intimate Partner Violence: Unknown (06/08/2017)   Humiliation, Afraid, Rape, and Kick questionnaire    Fear of Current or Ex-Partner: Patient declined    Emotionally Abused: Patient declined    Physically Abused: Patient declined    Sexually Abused: Patient declined   Wt Readings from Last 3 Encounters:  02/14/23 72 kg (158 lb 12.8 oz)  02/01/23 70.6 kg (155 lb 9.6 oz)  01/03/23 69.6 kg (153 lb 6.4 oz)   BP 96/64   Pulse 86   Wt 72 kg (158 lb 12.8 oz)   LMP 03/12/2019 (Approximate)   SpO2 99%   BMI 26.43 kg/m   PHYSICAL EXAM: General:  NAD. No resp difficulty, walked into clinic HEENT: Normal Neck: Supple. JVP 10. Carotids 2+ bilat; no bruits. No lymphadenopathy or thryomegaly appreciated.  + TTP left neck Cor: PMI nondisplaced. Regular rate & rhythm. No rubs, gallops or murmurs. Lungs: Diminished Abdomen: Soft, nontender, nondistended. No hepatosplenomegaly. No bruits or masses. Good bowel sounds. Extremities: No cyanosis, clubbing, rash, edema Neuro: Alert & oriented x 3, cranial nerves grossly intact. Moves all 4 extremities w/o difficulty. Affect pleasant.  ASSESSMENT & PLAN: 1. Chronic Systolic CHF:  Nonischemic cardiomyopathy.  Echo 5/24 with EF <20%, GIIIDD, RV mod reduced, mod elevated PASP ~58, LA mod dilated, RA mildly dilated, mild MR, mod TR. NICM. R/LHC showed normal coronaries, elevated filling pressures and low output, CI 2.1.  Cardiac MRI in 8/24 showed no delayed enhancement.  Echo was done 9/24, EF 25% with septal-lateral dyssynchrony, mild RV dysfunction, and IVC normal.  Etiology for cardiomyopathy uncertain. Previous history of heavy alcohol use but quit a number of years ago. HIV negative.  No viral-type illness prior to diagnosis. She has a FH of CHF (sister and mother). NYHA class II, she appears mildly volume overloaded on exam, weight up 5 lbs and ReDs 41% GDMT limited by soft BP. - Increase torsemide to 40 mg daily, increase KCL to 40 daily. BMET/BNP today,  repeat BMET in 10-14 days - Continue spironolactone 25 mg daily.  - Continue Marcelline Deist  10 mg daily. - Continue losartan 12.5 mg at night (hypotension on Entresto) - No BP room for beta blocker with SBP 96. - [facility] gene testing resulted in 2 genes of unknown significance. Refer to Dr. Jomarie Longs for genetic counseling. - She has seen EP and planning for CRT-D. I asked her to call EP office and schedule. 2. HTN: BP now running on the low side.  3. DMII: Continue SGLT2i. 4. Tobacco Abuse: Smoking 1/2 ppd, down from 2 ppd. Has smoked since she was 11. - Will send in Rx for nicotine patches. 5. DVT: Left leg DVT found 5/24.  Had DVT back in 1980s after she delivered her son. Hypercoagulable workup negative.  - Long-term Eliquis given unprovoked DVT. CBC today.  6. Fe deficiency anemia: given IV Fe during recent hospitalization. Will need this followed by PCP. - On Eliquis for DVT.  7. PVCs: Diurese as above. Consider sleep study next visit and Zio if persist. 8. Neck Pain: She is TTP on left neck, suspect MSK in nature. I asked her to follow up with PCP.  Follow up in 4 weeks with APP for BP and fluid check, and 4 months with Dr. Park Liter, FNP 02/14/23

## 2023-02-14 NOTE — Telephone Encounter (Signed)
Pt is wanting to schedule appt for ICD Implant. Pt would like a call back

## 2023-02-15 ENCOUNTER — Other Ambulatory Visit: Payer: Self-pay

## 2023-02-15 ENCOUNTER — Other Ambulatory Visit (HOSPITAL_COMMUNITY): Payer: Self-pay | Admitting: Family Medicine

## 2023-02-15 ENCOUNTER — Encounter (HOSPITAL_COMMUNITY): Payer: Medicaid Other

## 2023-02-15 DIAGNOSIS — I5022 Chronic systolic (congestive) heart failure: Secondary | ICD-10-CM

## 2023-02-15 NOTE — Telephone Encounter (Signed)
Pt is scheduled for BiV-ICD Implant with Dr. Elberta Fortis on 12/2 at 4:30 PM.  She is already scheduled to have labs done at the [facility] Clinic on 11/19 so I will ask them to add CBC to complete our workup.   She will come by our office to pick up surgical scrub.  Instruction letter will be sent via MyChart.    Pt takes losartan at night so she was instructed to hold all meds until after her procedure.

## 2023-02-15 NOTE — Telephone Encounter (Signed)
LM for pt to call back to schedule BiV-ICD Implant with Dr. Elberta Fortis.

## 2023-02-17 ENCOUNTER — Encounter (HOSPITAL_COMMUNITY)
Admission: RE | Admit: 2023-02-17 | Discharge: 2023-02-17 | Disposition: A | Discharge status: Home / Self Care | Payer: Medicaid Other | Source: Ambulatory Visit | Attending: Cardiology | Admitting: Cardiology

## 2023-02-17 DIAGNOSIS — I5022 Chronic systolic (congestive) heart failure: Secondary | ICD-10-CM | POA: Diagnosis present

## 2023-02-20 ENCOUNTER — Encounter (HOSPITAL_COMMUNITY)
Admission: RE | Admit: 2023-02-20 | Discharge: 2023-02-20 | Disposition: A | Discharge status: Home / Self Care | Payer: Medicaid Other | Source: Ambulatory Visit | Attending: Cardiology | Admitting: Cardiology

## 2023-02-20 DIAGNOSIS — I5022 Chronic systolic (congestive) heart failure: Secondary | ICD-10-CM

## 2023-02-22 ENCOUNTER — Encounter (HOSPITAL_COMMUNITY): Admission: RE | Admit: 2023-02-22 | Payer: Medicaid Other | Source: Ambulatory Visit

## 2023-02-24 ENCOUNTER — Encounter (HOSPITAL_COMMUNITY)
Admission: RE | Admit: 2023-02-24 | Discharge: 2023-02-24 | Disposition: A | Discharge status: Home / Self Care | Payer: Medicaid Other | Source: Ambulatory Visit | Attending: Cardiology

## 2023-02-24 VITALS — BP 98/68 | HR 82 | Ht 65.0 in | Wt 156.5 lb

## 2023-02-24 DIAGNOSIS — I5022 Chronic systolic (congestive) heart failure: Secondary | ICD-10-CM | POA: Diagnosis not present

## 2023-02-24 NOTE — Progress Notes (Signed)
Discharge Progress Report  Patient Details  Name: Kendra Griffith MRN: [id number] Date of Birth: [birth year] Referring Provider:   Flowsheet Row INTENSIVE CARDIAC REHAB ORIENT from 11/16/2022 in [facility]  Referring Provider Marca Ancona, MD        Number of Visits: 46  Reason for Discharge:  Patient reached a stable level of exercise. Patient has met program and personal goals.  Smoking History:  Social History   Tobacco Use  Smoking Status Every Day   Current packs/day: 2.00   Average packs/day: 2.0 packs/day for 29.0 years (58.0 ttl pk-yrs)   Types: Cigarettes  Smokeless Tobacco Never    Diagnosis:  Heart failure, chronic systolic (HCC)  ADL UCSD:   Initial Exercise Prescription:  Initial Exercise Prescription - 11/16/22 1300       Date of Initial Exercise RX and Referring Provider   Date 11/16/22    Referring Provider Marca Ancona, MD    Expected Discharge Date 02/08/23      NuStep   Level 1    SPM 60    Minutes 15    METs 2      Recumbant Elliptical   Level 1    RPM 60    Watts 80    Minutes 15    METs 2.5      Prescription Details   Frequency (times per week) 3    Duration Progress to 30 minutes of continuous aerobic without signs/symptoms of physical distress      Intensity   THRR 40-80% of Max Heartrate 65-131    Ratings of Perceived Exertion 11-13    Perceived Dyspnea 0-4      Progression   Progression Continue progressive overload as per policy without signs/symptoms or physical distress.      Resistance Training   Training Prescription Yes    Weight 3    Reps 10-15             Discharge Exercise Prescription (Final Exercise Prescription Changes):  Exercise Prescription Changes - 02/24/23 1035       Response to Exercise   Blood Pressure (Admit) 98/68    Blood Pressure (Exit) 116/76    Heart Rate (Admit) 82 bpm    Heart Rate (Exercise) 113 bpm    Heart Rate  (Exit) 94 bpm    Rating of Perceived Exertion (Exercise) 13    Symptoms None    Comments Vesenia completed the cardiac rehab program today.    Duration Continue with 30 min of aerobic exercise without signs/symptoms of physical distress.    Intensity THRR unchanged      Progression   Progression Continue to progress workloads to maintain intensity without signs/symptoms of physical distress.    Average METs 2.9      Resistance Training   Training Prescription No      Interval Training   Interval Training No   No weights today     Recumbant Elliptical   Level 3    RPM 47    Watts 59    Minutes 15    METs 3      Track   Laps 10   1213 feet   Minutes 6   6-minute walk test   METs 2.76      Home Exercise Plan   Plans to continue exercise at Home (comment)   Aerobics via Youtube, dancing, stretching   Frequency Add 4 additional days to program exercise sessions.  Initial Home Exercises Provided 12/30/22             Functional Capacity:  6 Minute Walk     Row Name 11/16/22 1116 02/24/23 1051       6 Minute Walk   Phase Initial Discharge    Distance 1180 feet 1213 feet    Distance % Change -- 2.8 %    Distance Feet Change -- 33 ft    Walk Time 6 minutes 6 minutes    # of Rest Breaks 0 0    MPH 2.23 2.3    METS 3.59 3.37    RPE 7 11    Perceived Dyspnea  0 0    VO2 Peak -- 11.78    Symptoms Yes (comment) No    Comments 10/10 bilateral feet pain-chronic. resolved with rest. used go cart --    Resting HR 72 bpm 82 bpm    Resting BP 93/64 98/68    Resting Oxygen Saturation  100 % --    Exercise Oxygen Saturation  during 6 min walk 100 % --    Max Ex. HR 106 bpm 113 bpm    Max Ex. BP 107/73 100/62    2 Minute Post BP 95/65 116/76             Psychological, QOL, Others - Outcomes: PHQ 2/9:    02/24/2023   10:39 AM 01/03/2023    2:12 PM 11/16/2022    9:21 AM 10/11/2022    1:51 PM 08/17/2022    9:52 AM  Depression screen PHQ 2/9  Decreased Interest 1 1 2  1 2   Down, Depressed, Hopeless 1 1 2 2 2   PHQ - 2 Score 2 2 4 3 4   Altered sleeping 1 1 2 1 2   Tired, decreased energy 0 1 2 1 2   Change in appetite 0 1 2 1 2   Feeling bad or failure about yourself  1 1 2 1 2   Trouble concentrating 1 1 2 2 2   Moving slowly or fidgety/restless 1 1 2 3 2   Suicidal thoughts 0 1 1 0 2  PHQ-9 Score 6 9 17 12 18   Difficult doing work/chores Somewhat difficult  Not difficult at all      Quality of Life:  Quality of Life - 02/24/23 1458       Quality of Life   Select Quality of Life      Quality of Life Scores   Health/Function Pre 11.2 %    Health/Function Post 15.63 %    Health/Function % Change 39.55 %    Socioeconomic Pre 7.5 %    Socioeconomic Post 6.86 %    Socioeconomic % Change  -8.53 %    Psych/Spiritual Pre 24 %    Psych/Spiritual Post 12 %    Psych/Spiritual % Change -50 %    Family Pre 22.8 %    Family Post 21.7 %    Family % Change -4.82 %    GLOBAL Pre 14.57 %    GLOBAL Post 13.97 %    GLOBAL % Change -4.12 %             Personal Goals: Goals established at orientation with interventions provided to work toward goal.  Personal Goals and Risk Factors at Admission - 11/16/22 0802       Core Components/Risk Factors/Patient Goals on Admission    Weight Management Yes    Intervention Weight Management: Provide education and appropriate resources to help participant work on  and attain dietary goals.;Weight Management: Develop a combined nutrition and exercise program designed to reach desired caloric intake, while maintaining appropriate intake of nutrient and fiber, sodium and fats, and appropriate energy expenditure required for the weight goal.    Expected Outcomes Short Term: Continue to assess and modify interventions until short term weight is achieved;Long Term: Adherence to nutrition and physical activity/exercise program aimed toward attainment of established weight goal;Understanding recommendations for meals to include  15-35% energy as protein, 25-35% energy from fat, 35-60% energy from carbohydrates, less than 200mg  of dietary cholesterol, 20-35 gm of total fiber daily;Understanding of distribution of calorie intake throughout the day with the consumption of 4-5 meals/snacks    Tobacco Cessation Yes    Number of packs per day 1-2    Intervention Assist the participant in steps to quit. Provide individualized education and counseling about committing to Tobacco Cessation, relapse prevention, and pharmacological support that can be provided by physician.;Education officer, environmental, assist with locating and accessing local/national Quit Smoking programs, and support quit date choice.    Expected Outcomes Short Term: Will demonstrate readiness to quit, by selecting a quit date.;Long Term: Complete abstinence from all tobacco products for at least 12 months from quit date.;Short Term: Will quit all tobacco product use, adhering to prevention of relapse plan.    Diabetes Yes    Intervention Provide education about proper nutrition, including hydration, and aerobic/resistive exercise prescription along with prescribed medications to achieve blood glucose in normal ranges: Fasting glucose 65-99 mg/dL;Provide education about signs/symptoms and action to take for hypo/hyperglycemia.    Expected Outcomes Short Term: Participant verbalizes understanding of the signs/symptoms and immediate care of hyper/hypoglycemia, proper foot care and importance of medication, aerobic/resistive exercise and nutrition plan for blood glucose control.;Long Term: Attainment of HbA1C < 7%.    Heart Failure Yes    Intervention Provide a combined exercise and nutrition program that is supplemented with education, support and counseling about heart failure. Directed toward relieving symptoms such as shortness of breath, decreased exercise tolerance, and extremity edema.    Expected Outcomes Improve functional capacity of life;Short term: Attendance in  program 2-3 days a week with increased exercise capacity. Reported lower sodium intake. Reported increased fruit and vegetable intake. Reports medication compliance.;Short term: Daily weights obtained and reported for increase. Utilizing diuretic protocols set by physician.;Long term: Adoption of self-care skills and reduction of barriers for early signs and symptoms recognition and intervention leading to self-care maintenance.    Hypertension Yes    Intervention Provide education on lifestyle modifcations including regular physical activity/exercise, weight management, moderate sodium restriction and increased consumption of fresh fruit, vegetables, and low fat dairy, alcohol moderation, and smoking cessation.;Monitor prescription use compliance.    Expected Outcomes Short Term: Continued assessment and intervention until BP is < 140/63mm HG in hypertensive participants. < 130/9mm HG in hypertensive participants with diabetes, heart failure or chronic kidney disease.;Long Term: Maintenance of blood pressure at goal levels.    Lipids Yes    Intervention Provide education and support for participant on nutrition & aerobic/resistive exercise along with prescribed medications to achieve LDL 70mg , HDL >40mg .    Expected Outcomes Short Term: Participant states understanding of desired cholesterol values and is compliant with medications prescribed. Participant is following exercise prescription and nutrition guidelines.;Long Term: Cholesterol controlled with medications as prescribed, with individualized exercise RX and with personalized nutrition plan. Value goals: LDL < 70mg , HDL > 40 mg.    Stress Yes    Intervention Offer individual  and/or small group education and counseling on adjustment to heart disease, stress management and health-related lifestyle change. Teach and support self-help strategies.;Refer participants experiencing significant psychosocial distress to appropriate mental health specialists  for further evaluation and treatment. When possible, include family members and significant others in education/counseling sessions.    Expected Outcomes Short Term: Participant demonstrates changes in health-related behavior, relaxation and other stress management skills, ability to obtain effective social support, and compliance with psychotropic medications if prescribed.;Long Term: Emotional wellbeing is indicated by absence of clinically significant psychosocial distress or social isolation.              Personal Goals Discharge:  Goals and Risk Factor Review     Row Name 12/02/22 1509 12/29/22 1500 01/31/23 0754         Core Components/Risk Factors/Patient Goals Review   Personal Goals Review Weight Management/Obesity;Lipids;Diabetes;Tobacco Cessation;Heart Failure;Hypertension;Stress Weight Management/Obesity;Lipids;Diabetes;Tobacco Cessation;Heart Failure;Hypertension;Stress Weight Management/Obesity;Lipids;Diabetes;Tobacco Cessation;Heart Failure;Hypertension;Stress     Review Krisandra is off to a good start to exercise. Raechel reports feeling lightheaded at times at home. orthostatic bp's checked. Systolic bP's in the 90's. heart failure clinic notofied about BP's. CBG's WNL. Encourage smoking cessation Tasheen returned to cardiac rehab after being absent for several sessions. Vital signs have been stable. Lokelani hjas gained 4 kg since starting cardiac rehab. Encourage smoking cessation. Anaida has been doing well with exercise at cardiac rehab. Vital signs have been stable. Verlon Au hjas gained 3.7 kg since starting cardiac rehab. Continue to  Encourage smoking cessation.     Expected Outcomes Makana will continue to participate in cardiac rehab for exercise, nutrition and lifestyle modifications Wanetta will continue to participate in cardiac rehab for exercise, nutrition and lifestyle modifications Reonna will continue to participate in cardiac rehab for exercise, nutrition and lifestyle  modifications              Exercise Goals and Review:  Exercise Goals     Row Name 11/16/22 0802             Exercise Goals   Increase Physical Activity Yes       Intervention Provide advice, education, support and counseling about physical activity/exercise needs.;Develop an individualized exercise prescription for aerobic and resistive training based on initial evaluation findings, risk stratification, comorbidities and participant's personal goals.       Expected Outcomes Short Term: Attend rehab on a regular basis to increase amount of physical activity.;Long Term: Exercising regularly at least 3-5 days a week.;Long Term: Add in home exercise to make exercise part of routine and to increase amount of physical activity.       Increase Strength and Stamina Yes       Intervention Provide advice, education, support and counseling about physical activity/exercise needs.;Develop an individualized exercise prescription for aerobic and resistive training based on initial evaluation findings, risk stratification, comorbidities and participant's personal goals.       Expected Outcomes Short Term: Perform resistance training exercises routinely during rehab and add in resistance training at home;Short Term: Increase workloads from initial exercise prescription for resistance, speed, and METs.;Long Term: Improve cardiorespiratory fitness, muscular endurance and strength as measured by increased METs and functional capacity ( )       Able to understand and use rate of perceived exertion (RPE) scale Yes       Intervention Provide education and explanation on how to use RPE scale       Expected Outcomes Short Term: Able to use RPE daily in rehab to express subjective  intensity level;Long Term:  Able to use RPE to guide intensity level when exercising independently       Knowledge and understanding of Target Heart Rate Range (THRR) Yes       Intervention Provide education and explanation of THRR  including how the numbers were predicted and where they are located for reference       Expected Outcomes Short Term: Able to state/look up THRR;Short Term: Able to use daily as guideline for intensity in rehab;Long Term: Able to use THRR to govern intensity when exercising independently       Understanding of Exercise Prescription Yes       Intervention Provide education, explanation, and written materials on patient's individual exercise prescription       Expected Outcomes Short Term: Able to explain program exercise prescription;Long Term: Able to explain home exercise prescription to exercise independently                Exercise Goals Re-Evaluation:  Exercise Goals Re-Evaluation     Row Name 11/23/22 1133 12/05/22 0814 12/28/22 1126 12/30/22 1109 01/27/23 1054     Exercise Goal Re-Evaluation   Exercise Goals Review Increase Physical Activity;Increase Strength and Stamina;Able to understand and use rate of perceived exertion (RPE) scale Increase Physical Activity;Increase Strength and Stamina;Able to understand and use rate of perceived exertion (RPE) scale Increase Physical Activity;Increase Strength and Stamina;Able to understand and use rate of perceived exertion (RPE) scale Increase Physical Activity;Increase Strength and Stamina;Able to understand and use rate of perceived exertion (RPE) scale;Knowledge and understanding of Target Heart Rate Range (THRR);Understanding of Exercise Prescription Increase Physical Activity;Increase Strength and Stamina;Able to understand and use rate of perceived exertion (RPE) scale;Knowledge and understanding of Target Heart Rate Range (THRR);Understanding of Exercise Prescription   Comments Patient able to understand and use RPE scale appropriately. Patient havin issues with painful toenails. Exercise on hold pending appointment with podiatrist this week. Feigy returned to exercise today after being out due to foot callous. She tolerated exercise fairly  well. The recumbent elliptical cause some discomfort to the callous on her heel, so she finished the station walking the track. Reviewed exercise prescription with Verlon Au. She is doing aerobic and dancing routines at home using YouTube videos. She would like to do more walking at home. Her goal is to stay motivated to exercise and to quit smoking. She has a smart watch but hasn't been using it to monitor her heart rate. Reviewed goals with Verlon Au today. She states she does well here but is stagnant at home. She wants to start walking at home. We discussed adding 30 minutes at least 1 day/week consistently at home to build a routine and add days as tolerated.   Expected Outcomes Progress workloads as tolerated to help improve strength and stamina. Will review goals upon return to exercise. Will progress workloads as tolerated. Sherridan will continue daily exercise routine. Curlie will add 30 minutes walking at home at least 1 day/week in addition to exercise at cardiac rehab.    Row Name 02/17/23 1108 02/24/23 1141           Exercise Goal Re-Evaluation   Exercise Goals Review Increase Physical Activity;Increase Strength and Stamina;Able to understand and use rate of perceived exertion (RPE) scale;Knowledge and understanding of Target Heart Rate Range (THRR);Understanding of Exercise Prescription Increase Physical Activity;Increase Strength and Stamina;Able to understand and use rate of perceived exertion (RPE) scale;Knowledge and understanding of Target Heart Rate Range (THRR);Understanding of Exercise Prescription  Comments Discussed Lameisha's exercise action plan post cardiac rehab. She is looking into arranging transportation to a fitness facility but otherwise, she can walk as her mode of home exercise. Christinia completed the cardiac rehab program today and progressed well achieving 3.1 METs with exercise. She plans to walk at home as her mode of exercise but is limited by feet pain. She is also looking  into getting a seated piece of equipment like a recumbent bike that she can use for her exercise routine. We discussed shorter bouts of 10-15 minutes 2-3 times/day when her feet are bothering her.      Expected Outcomes Auda will continue walking as her mode of home exercise. Mafalda will continue walking as her mode of home exercise.               Nutrition & Weight - Outcomes:  Pre Biometrics - 11/16/22 0837       Pre Biometrics   Waist Circumference 34 inches    Hip Circumference 37 inches    Waist to Hip Ratio 0.92 %    Triceps Skinfold 36 mm    % Body Fat 37.6 %    Grip Strength 22 kg    Flexibility 0 in   not done due to low back pain   Single Leg Stand 11.06 seconds             Post Biometrics - 02/24/23 1045        Post  Biometrics   Height 5\' 5"  (1.651 m)    Waist Circumference 36.25 inches    Hip Circumference 40 inches    Waist to Hip Ratio 0.91 %    BMI (Calculated) 26.05    Triceps Skinfold 23 mm    % Body Fat 36.6 %    Grip Strength 30 kg    Flexibility 13.13 in   not done due to low back pain   Single Leg Stand 29.56 seconds             Nutrition:  Nutrition Therapy & Goals - 02/22/23 1603       Nutrition Therapy   Diet Heart Healthy Diet    Drug/Food Interactions Statins/Certain Fruits      Personal Nutrition Goals   Nutrition Goal Patient to identify strategies for reducing cardiovascular risk by attending the [employer] education and nutrition series weekly.   goal in progress.   Personal Goal #2 Patient to improve diet quality by using the plate method as a guide for meal planning to include lean protein/plant protein, fruits, vegetables, whole grains, nonfat dairy as part of a well-balanced diet.   goal in progress.   Personal Goal #3 Patient to reduce sodium intake to 2000mg  per day   goal in progress.   Comments Goals in progress. Genasis continues to attend the [employer] and nutrition series regularly. Aureliana reported  motivation to reduce sugar intake (sugary coffee drinks) and continue to improve eating habits. She did also report food insecurity and difficulty getting to the grocery store; our nursing staff and the social worker at the [facility] continue to work on getting resources to her. She does enjoy a wide variety of foods including fish and fruits/vegetables. She has improved understanding of benefits of increased fruit/vegetable intake, fiber recommenations, and reading labels for sodium. Per documentation, she is up 7.7# since her orientation weight but she has maintained weight over the last 30 days. She continues regular follow-up with the heart failure clinic and is scheduled  for ICD insertion in December. She continues to work on smoking cessation.  Patient will benefit from participation in intensive cardiac rehab for nutrition, exercise, and lifestyle modification. Will continue to assess goals upon improved attendance.      Intervention Plan   Intervention Prescribe, educate and counsel regarding individualized specific dietary modifications aiming towards targeted core components such as weight, hypertension, lipid management, diabetes, heart failure and other comorbidities.;Nutrition handout(s) given to patient.    Expected Outcomes Short Term Goal: Understand basic principles of dietary content, such as calories, fat, sodium, cholesterol and nutrients.;Long Term Goal: Adherence to prescribed nutrition plan.             Nutrition Discharge:  Nutrition Assessments - 02/24/23 1546       Rate Your Plate Scores   Post Score 55             Education Questionnaire Score:  Knowledge Questionnaire Score - 02/24/23 1458       Knowledge Questionnaire Score   Pre Score 21/28    Post Score 19/28             Goals reviewed with patient; copy given to patient.Pt graduates from  Intensive/Traditional cardiac rehab program today with completion of  22 exercise and  22  education sessions. Pt maintained fair attendance and progressed nicely during their participation in rehab as evidenced by increased MET level.   Medication list reconciled. Repeat  PHQ score- 6. Hiawatha says she is in a better place than when she started cardiac rehab.   Pt has made  lifestyle changes and should be commended for their success. Darissa achieved their goals during cardiac rehab.   Pt plans to continue exercise at  home via walking. Semaj has gained 3.5 kg while in the program. Letti continues to smoke and says she will work on cessation. Liria says that participating in cardiac rehab has been helpful for her. We are proud of Parveen's progress!Thayer Headings RN BSN

## 2023-02-28 ENCOUNTER — Ambulatory Visit (HOSPITAL_COMMUNITY)
Admission: RE | Admit: 2023-02-28 | Discharge: 2023-02-28 | Disposition: A | Discharge status: Home / Self Care | Payer: Medicaid Other | Source: Ambulatory Visit | Attending: Cardiology

## 2023-02-28 DIAGNOSIS — I5022 Chronic systolic (congestive) heart failure: Secondary | ICD-10-CM | POA: Insufficient documentation

## 2023-02-28 LAB — BASIC METABOLIC PANEL
Anion gap: 7 (ref 5–15)
BUN: 16 mg/dL (ref 6–20)
CO2: 28 mmol/L (ref 22–32)
Calcium: 9.7 mg/dL (ref 8.9–10.3)
Chloride: 104 mmol/L (ref 98–111)
Creatinine, Ser: 1.01 mg/dL — ABNORMAL HIGH (ref 0.44–1.00)
GFR, Estimated: >60 mL/min (ref >60)
Glucose, Bld: 107 mg/dL — ABNORMAL HIGH (ref 70–99)
Potassium: 4.1 mmol/L (ref 3.5–5.1)
Sodium: 139 mmol/L (ref 135–145)

## 2023-02-28 LAB — CBC
HCT: 40.2 % (ref 36.0–46.0)
Hemoglobin: 12.4 g/dL (ref 12.0–15.0)
MCH: 28.2 pg (ref 26.0–34.0)
MCHC: 30.8 g/dL (ref 30.0–36.0)
MCV: 91.6 fL (ref 80.0–100.0)
Platelets: 162 10*3/uL (ref 150–400)
RBC: 4.39 MIL/uL (ref 3.87–5.11)
RDW: 13.3 % (ref 11.5–15.5)
WBC: 5.7 10*3/uL (ref 4.0–10.5)
nRBC: 0 % (ref 0.0–0.2)

## 2023-03-06 ENCOUNTER — Other Ambulatory Visit (HOSPITAL_COMMUNITY): Payer: Self-pay

## 2023-03-06 ENCOUNTER — Other Ambulatory Visit: Payer: Self-pay

## 2023-03-06 ENCOUNTER — Other Ambulatory Visit: Payer: Self-pay | Admitting: Nurse Practitioner

## 2023-03-06 DIAGNOSIS — E785 Hyperlipidemia, unspecified: Secondary | ICD-10-CM

## 2023-03-06 MED ORDER — ROSUVASTATIN CALCIUM 5 MG PO TABS
5.0000 mg | ORAL_TABLET | Freq: Every day | ORAL | 0 refills | Status: DC
  Filled 2023-03-06: qty 90, 90d supply, fill #0

## 2023-03-07 ENCOUNTER — Other Ambulatory Visit: Payer: Self-pay

## 2023-03-13 ENCOUNTER — Telehealth: Payer: Self-pay | Admitting: Cardiology

## 2023-03-13 ENCOUNTER — Other Ambulatory Visit: Payer: Self-pay

## 2023-03-13 NOTE — Telephone Encounter (Signed)
Patient advised she is fine to use an under desk elliptical. Pt appreciative of call back.

## 2023-03-13 NOTE — Telephone Encounter (Signed)
Patient is calling asking to speak to the nurse. Calling to see if she can get hospital bed and is it safe for her to use a stepper while sitting. Please advise

## 2023-03-13 NOTE — Telephone Encounter (Signed)
Pt advised that she can go up and down stairs after ICD implant, so she would not need to remain on one floor/hospital bed.  Pt also reports that she just got an under desk elliptical.  Instructions say it may interfere with device.  Aware I am forwarding to device clinic to address this question.  (Pt scheduled for implant 12/12).  She understands they will follow up with her.

## 2023-03-14 ENCOUNTER — Other Ambulatory Visit: Payer: Self-pay

## 2023-03-15 ENCOUNTER — Ambulatory Visit: Payer: Self-pay

## 2023-03-15 ENCOUNTER — Other Ambulatory Visit (HOSPITAL_COMMUNITY): Payer: Self-pay

## 2023-03-15 ENCOUNTER — Other Ambulatory Visit: Payer: Self-pay

## 2023-03-15 ENCOUNTER — Other Ambulatory Visit: Payer: Self-pay | Admitting: Nurse Practitioner

## 2023-03-15 DIAGNOSIS — G894 Chronic pain syndrome: Secondary | ICD-10-CM

## 2023-03-15 DIAGNOSIS — G621 Alcoholic polyneuropathy: Secondary | ICD-10-CM

## 2023-03-15 MED ORDER — ACETAMINOPHEN-CODEINE 300-30 MG PO TABS
1.0000 | ORAL_TABLET | ORAL | 0 refills | Status: DC | PRN
  Filled 2023-03-15: qty 60, 5d supply, fill #0

## 2023-03-15 NOTE — Telephone Encounter (Signed)
    Chief Complaint: Bilateral foot pain, neuropathy. Meds no longer working. Asking to be worked in. Having defibrillator placed 03/23/23. Symptoms: Above Frequency: Chronic Pertinent Negatives: Patient denies  Disposition: [] ED /[] Urgent Care (no appt availability in office) / [] Appointment(In office/virtual)/ []  [facility]/ [] Home Care/ [] Refused Recommended Disposition /[] [facility]/ [x]  Follow-up with PCP Additional Notes: Please advise pt. About appointment.  Reason for Disposition  [1] MODERATE pain (e.g., interferes with normal activities, limping) AND [2] present > 3 days  Answer Assessment - Initial Assessment Questions 1. ONSET: "When did the pain start?"      For awhile 2. LOCATION: "Where is the pain located?"      Both feet 3. PAIN: "How bad is the pain?"    (Scale 1-10; or mild, moderate, severe)  - MILD (1-3): doesn't interfere with normal activities.   - MODERATE (4-7): interferes with normal activities (e.g., work or school) or awakens from sleep, limping.   - SEVERE (8-10): excruciating pain, unable to do any normal activities, unable to walk.      10 4. WORK OR EXERCISE: "Has there been any recent work or exercise that involved this part of the body?"      No 5. CAUSE: "What do you think is causing the foot pain?"     Neuropathy 6. OTHER SYMPTOMS: "Do you have any other symptoms?" (e.g., leg pain, rash, fever, numbness)     No 7. PREGNANCY: "Is there any chance you are pregnant?" "When was your last menstrual period?"     No  Protocols used: Foot Pain-A-AH

## 2023-03-15 NOTE — Telephone Encounter (Signed)
Tyenlol #3 sent to pharmacy. Patient is aware

## 2023-03-15 NOTE — Telephone Encounter (Signed)
Spoke with patient to confirm what medications are no longer working for  Bilateral foot pain, neuropathy. Patient voiced gabapentin (NEURONTIN) and acetaminophen-codeine (TYLENOL #3) last noted Rx #: [id number]  gabapentin (NEURONTIN) 300 MG capsule was  filled be PCP on 03/15/2022 and  acetaminophen-codeine (TYLENOL #3) was filled on 07/08/2022 count of 60 pills with no refills. I asked patient has she been taking her pills as ordered, patient  said yes she had. I asked patient  who has been filling medications  for her she said Zelda fleming , her PCP . Advised patient that according to our records acetaminophen-codeine (TYLENOL #3) last fill on 07/08/2022 and gabapentin (NEURONTIN) 300 MG capsule was  filled be PCP on 03/15/2022 . So she should be out. Patient voiced her PCP just refilled  gabapentin (NEURONTIN) on 03/06/2023. Advised patient I would have to speak with her PCP and follow-up with her. Patient voiced that she also would like a referral for pain management.   Call place to pharmacy spoke with [facility]. Lequita Halt verified that the order for gabapentin (NEURONTIN) 300 MG capsule was  sent on 03/16/2023. Patient refilled them on 09/26/2022 and 03/06/2023. Patient has not more refill available.   Acetaminophen-codeine (TYLENOL #3) last filled on 07/08/2022 and has no more refills

## 2023-03-16 ENCOUNTER — Other Ambulatory Visit (HOSPITAL_COMMUNITY): Payer: Self-pay

## 2023-03-16 ENCOUNTER — Other Ambulatory Visit: Payer: Self-pay

## 2023-03-21 ENCOUNTER — Encounter (HOSPITAL_COMMUNITY): Payer: Medicaid Other

## 2023-03-21 NOTE — Progress Notes (Incomplete)
Advanced Heart Failure Clinic Note    PCP: Claiborne Rigg, NP HF Cardiologist: Dr. Shirlee Latch  57 y.o. AAF with past medical history of HTN, DM, tobacco use, anxiety, bipolar disorder, a remote history of ETOH abuse, and diagnosis of systolic heart failure and LE DVT in 5/24.   Admitted 5/24 with new acute systolic heart failure. Echo showed EF <20%, GIIIDD, RV mod reduced, mod elevated PASP ~58, LA mod dilated, RA mildly dilated, mild MR, mod TR. Hs trops negative. She was diuresed w/ IV Lasix and started on GDMT. Heparin started for LE DVT. V/Q scan showed no perfusion defects to suggest PE. Hypercoagulable workup was negative. R/LHC showed normal coronary arteries, elevated filling pressures with low cardiac output (RA 6, PA 47/24/33, PA sat 57%, Fick CI 2.1). She was diuresed further w/ IV Lasix and transitioned to PO torsemide. GDMT further titrated. Switched to Eliquis after completion of cath.   Cardiac MRI in 8/24 showed LV EF 25%, RV EF 36%, no delayed enhancement.    Echo 9/24 EF 25% with septal-lateral dyssynchrony, mild RV dysfunction, and IVC normal.   Today she returns for HF follow up. Overall feeling fine. She has SOB walking up steps, doing CR and able to finish classes. She feels palpitations. Denies abnormal bleeding, CP, dizziness, edema, or PND/Orthopnea. Appetite ok. No fever or chills. Weight at home 156 pounds. Taking all medications. Smoking 1/2 ppd. Has neck pain/swelling, she is unsure if she slept on it wrong.  ReDs: 41%  ECG (personally reviewed): NSR with occasional PVCs  Labs (7/24): K 4.6, creatinine 1.07 => 1.09, BNP 77 Labs (9/24): K 4.0, creatinine 1.10, BNP 50    PMH: 1. DVT: DVT in 1980.  Left lower leg DVT in 5/24.  - Prothrombin gene mutation negative, factor V Leiden negative, lupus anticoagulant negative 2. Type 2 diabetes 3. GERD 4. HTN 5. Sickle cell trait 6. Active smoker 7. Prior ETOH abuse.  8. Bipolar disorder 9. Chronic systolic CHF:  Nonischemic cardiomyopathy.  Diagnosis by echo in 5/24 showing EF < 20%, grade III DD, RV moderately reduced, RVSP 58 mmHg, mild MR, moderate TR.  - R/LHC (5/24): normal coronaries; RA 6, PA 47/24 (33), PCWP 23, CO/CI (Fick) 3.4/2.1, PVR 2.6 WU.  - Cardiac MRI (8/24): LVEF 25%, RVEF 36%, no delayed enhancement.   - Echo (9/24): EF 25% with septal-lateral dyssynchrony, mild RV dysfunction, and IVC normal.   FH: Mother with CHF, ? History of rheumatic heart disease.  Sister with CHF.   Current Outpatient Medications  Medication Sig Dispense Refill   acetaminophen-codeine (TYLENOL #3) 300-30 MG tablet Take 1-2 tablets by mouth every 4 (four) hours as needed for moderate pain (pain score 4-6). 60 tablet 0   albuterol (VENTOLIN HFA) 108 (90 Base) MCG/ACT inhaler Inhale 2 puffs into the lungs every 6 (six) hours as needed for wheezing or shortness of breath. 18 g 0   amitriptyline (ELAVIL) 75 MG tablet Take 1 tablet (75 mg total) by mouth at bedtime for pain and depression (Patient taking differently: Take 75 mg by mouth at bedtime as needed for sleep.) 90 tablet 1   apixaban (ELIQUIS) 5 MG TABS tablet Take 1 tablet (5 mg total) by mouth 2 (two) times daily. 60 tablet 6   Biotin 5000 MCG CAPS Take 5,000 mcg by mouth daily.     Blood Pressure Monitor DEVI Please provide patient with insurance approved blood pressure monitor 1 each 0   cholecalciferol (VITAMIN D3) 25 MCG (1000 UT)  tablet Take 1,000 Units by mouth daily.     dapagliflozin propanediol (FARXIGA) 10 MG TABS tablet Take 1 tablet (10 mg total) by mouth daily. 30 tablet 6   famotidine (PEPCID) 20 MG tablet Take 1 tablet (20 mg total) by mouth 2 (two) times daily for heartburn (Patient taking differently: Take 20 mg by mouth every morning.) 180 tablet 1   gabapentin (NEURONTIN) 300 MG capsule Take 1 capsule (300 mg total) by mouth 2 (two) times daily. (Patient taking differently: Take 300 mg by mouth 3 (three) times daily as needed (pain).) 180  capsule 3   hydrOXYzine (ATARAX) 50 MG tablet Take 1 tablet (50 mg total) by mouth 3 (three) times daily as needed. Please deliver (Patient taking differently: Take 25 mg by mouth 3 (three) times daily as needed for anxiety. Please deliver) 90 tablet 2   losartan (COZAAR) 25 MG tablet Take 0.5 tablets (12.5 mg total) by mouth at bedtime. 45 tablet 3   Menthol, Topical Analgesic, (BIOFREEZE EX) Apply 1 spray topically daily as needed (foot pain). Menthol     Misc. Devices MISC Please provide patient with incontinence pads or disposable underwear or choice. N39.3 1 each PRN   Multiple Vitamins-Minerals (MULTIVITAMIN WITH MINERALS) tablet Take 1 tablet by mouth daily.     nicotine (NICODERM CQ - DOSED IN MG/24 HOURS) 14 mg/24hr patch Place 1 patch (14 mg total) onto the skin daily. (Patient not taking: Reported on 02/24/2023) 42 patch 0   [START ON 03/29/2023] nicotine (NICODERM CQ - DOSED IN MG/24 HR) 7 mg/24hr patch Place 1 patch (7 mg total) onto the skin daily. (Patient not taking: Reported on 02/24/2023) 14 patch 0   omeprazole (PRILOSEC) 20 MG capsule Take 1 capsule (20 mg total) by mouth daily for heartburn (Patient not taking: Reported on 03/17/2023) 90 capsule 1   potassium chloride SA (KLOR-CON M) 20 MEQ tablet Take 2 tablets (40 mEq total) by mouth daily. (Patient taking differently: Take 20 mEq by mouth daily.) 60 tablet 6   rosuvastatin (CRESTOR) 5 MG tablet Take 1 tablet (5 mg total) by mouth daily for cholesterol 90 tablet 0   spironolactone (ALDACTONE) 25 MG tablet Take 1 tablet (25 mg total) by mouth daily. 30 tablet 3   tetrahydrozoline 0.05 % ophthalmic solution Place 1 drop into both eyes daily as needed (itching /red eyes).     torsemide (DEMADEX) 20 MG tablet Take 2 tablets (40 mg total) by mouth daily. (Patient taking differently: Take 20 mg by mouth daily.) 60 tablet 6   trolamine salicylate (ASPERCREME) 10 % cream Apply 1 Application topically daily as needed (Foot pain). With  lidocaine     urea (CARMOL) 40 % CREA Apply 1 application  topically daily as needed (Foot pain).     vitamin B-12 (CYANOCOBALAMIN) 500 MCG tablet Take 500 mcg by mouth daily.     No current facility-administered medications for this visit.   Allergies  Allergen Reactions   Amoxicillin     Pain Did it involve swelling of the face/tongue/throat, SOB, or low BP? No Did it involve sudden or severe rash/hives, skin peeling, or any reaction on the inside of your mouth or nose? No Did you need to seek medical attention at a hospital or doctor's office? Yes When did it last happen?      5 years ago If all above answers are "NO", may proceed with cephalosporin use.    Darvocet [Propoxyphene N-Acetaminophen] Nausea And Vomiting    Makes her  jittery   Hydrocodone-Acetaminophen Nausea And Vomiting    Upset stomach   Valium [Diazepam] Other (See Comments)    Makes pt. Feel "out of wack"   Social History   Socioeconomic History   Marital status: Single    Spouse name: Not on file   Number of children: 3   Years of education: 8-9   Highest education level: 8th grade  Occupational History   Occupation: applying for disability  Tobacco Use   Smoking status: Every Day    Current packs/day: 2.00    Average packs/day: 2.0 packs/day for 29.0 years (58.0 ttl pk-yrs)    Types: Cigarettes   Smokeless tobacco: Never  Vaping Use   Vaping status: Never Used  Substance and Sexual Activity   Alcohol use: No    Alcohol/week: 0.0 standard drinks of alcohol    Comment: Stopped drinking 2013   Drug use: No   Sexual activity: Not Currently    Birth control/protection: None  Other Topics Concern   Not on file  Social History Narrative   Patient lives alone in an apartment on the first floor.  3 children.  Applying for disability.  Education: 8th or 9th grade.   Currently not working   Trying to go back to school online for [employer]   Six Grandchildren   Social Determinants of Health   Financial  Resource Strain: Medium Risk (09/02/2022)   Overall Financial Resource Strain (CARDIA)    Difficulty of Paying Living Expenses: Somewhat hard  Food Insecurity: Food Insecurity Present (10/26/2022)   Hunger Vital Sign    Worried About Running Out of Food in the Last Year: Sometimes true    Ran Out of Food in the Last Year: Sometimes true  Transportation Needs: Unmet Transportation Needs (11/25/2022)   PRAPARE - Administrator, Civil Service (Medical): No    Lack of Transportation (Non-Medical): Yes  Physical Activity: Insufficiently Active (09/02/2022)   Exercise Vital Sign    Days of Exercise per Week: 1 day    Minutes of Exercise per Session: 30 min  Stress: Stress Concern Present (09/02/2022)   [employer] of Occupational Health - Occupational Stress Questionnaire    Feeling of Stress : To some extent  Social Connections: Moderately Isolated (09/02/2022)   Social Connection and Isolation Panel [NHANES]    Frequency of Communication with Friends and Family: Three times a week    Frequency of Social Gatherings with Friends and Family: Once a week    Attends Religious Services: Never    Database administrator or Organizations: Yes    Attends Banker Meetings: 1 to 4 times per year    Marital Status: Divorced  Intimate Partner Violence: Unknown (06/08/2017)   Humiliation, Afraid, Rape, and Kick questionnaire    Fear of Current or Ex-Partner: Patient declined    Emotionally Abused: Patient declined    Physically Abused: Patient declined    Sexually Abused: Patient declined   Wt Readings from Last 3 Encounters:  02/24/23 71 kg (156 lb 8.4 oz)  02/14/23 72 kg (158 lb 12.8 oz)  02/01/23 70.6 kg (155 lb 9.6 oz)   LMP 03/12/2019 (Approximate)   PHYSICAL EXAM: General:  NAD. No resp difficulty, walked into clinic HEENT: Normal Neck: Supple. JVP 10. Carotids 2+ bilat; no bruits. No lymphadenopathy or thryomegaly appreciated.  + TTP left neck Cor: PMI  nondisplaced. Regular rate & rhythm. No rubs, gallops or murmurs. Lungs: Diminished Abdomen: Soft, nontender, nondistended. No hepatosplenomegaly. No  bruits or masses. Good bowel sounds. Extremities: No cyanosis, clubbing, rash, edema Neuro: Alert & oriented x 3, cranial nerves grossly intact. Moves all 4 extremities w/o difficulty. Affect pleasant.  ASSESSMENT & PLAN: 1. Chronic Systolic CHF:  Nonischemic cardiomyopathy.  Echo 5/24 with EF <20%, GIIIDD, RV mod reduced, mod elevated PASP ~58, LA mod dilated, RA mildly dilated, mild MR, mod TR. NICM. R/LHC showed normal coronaries, elevated filling pressures and low output, CI 2.1.  Cardiac MRI in 8/24 showed no delayed enhancement.  Echo was done 9/24, EF 25% with septal-lateral dyssynchrony, mild RV dysfunction, and IVC normal.  Etiology for cardiomyopathy uncertain. Previous history of heavy alcohol use but quit a number of years ago. HIV negative.  No viral-type illness prior to diagnosis. She has a FH of CHF (sister and mother). NYHA class II, she appears mildly volume overloaded on exam, weight up 5 lbs and ReDs 41% GDMT limited by soft BP. - Increase torsemide to 40 mg daily, increase KCL to 40 daily. BMET/BNP today, repeat BMET in 10-14 days - Continue spironolactone 25 mg daily.  - Continue Farxiga 10 mg daily. - Continue losartan 12.5 mg at night (hypotension on Entresto) - No BP room for beta blocker with SBP 96. - [facility] gene testing resulted in 2 genes of unknown significance. Refer to Dr. Jomarie Longs for genetic counseling. - She has seen EP and planning for CRT-D. I asked her to call EP office and schedule. 2. HTN: BP now running on the low side.  3. DMII: Continue SGLT2i. 4. Tobacco Abuse: Smoking 1/2 ppd, down from 2 ppd. Has smoked since she was 11. - Will send in Rx for nicotine patches. 5. DVT: Left leg DVT found 5/24.  Had DVT back in 1980s after she delivered her son. Hypercoagulable workup negative.  - Long-term Eliquis given  unprovoked DVT. CBC today.  6. Fe deficiency anemia: given IV Fe during recent hospitalization. Will need this followed by PCP. - On Eliquis for DVT.  7. PVCs: Diurese as above. Consider sleep study next visit and Zio if persist. 8. Neck Pain: She is TTP on left neck, suspect MSK in nature. I asked her to follow up with PCP.  Follow up in 4 weeks with APP for BP and fluid check, and 4 months with Dr. McLean  [place] Chanze Teagle, NP 03/21/23

## 2023-03-22 ENCOUNTER — Telehealth: Payer: Self-pay

## 2023-03-22 NOTE — Telephone Encounter (Signed)
Dr. Camnitz aware. 

## 2023-03-22 NOTE — Telephone Encounter (Signed)
Just FYI....  Called pt to remind her of procedure time. She was advised to hold Farxiga x 3 days and Eliquis x 2 days. She held them both the first day but forgot and took them this morning.  I just told her not to take her 2nd dose of Eliquis today and nothing in the morning.

## 2023-03-23 ENCOUNTER — Ambulatory Visit (HOSPITAL_COMMUNITY): Payer: Medicaid Other

## 2023-03-23 ENCOUNTER — Other Ambulatory Visit: Payer: Self-pay

## 2023-03-23 ENCOUNTER — Telehealth: Payer: Self-pay | Admitting: Cardiology

## 2023-03-23 ENCOUNTER — Ambulatory Visit (HOSPITAL_COMMUNITY)
Admission: RE | Admit: 2023-03-23 | Discharge: 2023-03-23 | Disposition: A | Discharge status: Home / Self Care | Payer: Medicaid Other | Attending: Cardiology | Admitting: Cardiology

## 2023-03-23 ENCOUNTER — Ambulatory Visit (HOSPITAL_COMMUNITY)
Admission: RE | Disposition: A | Discharge status: Home / Self Care | Payer: Self-pay | Source: Home / Self Care | Attending: Cardiology

## 2023-03-23 DIAGNOSIS — I5022 Chronic systolic (congestive) heart failure: Secondary | ICD-10-CM | POA: Diagnosis not present

## 2023-03-23 DIAGNOSIS — I110 Hypertensive heart disease with heart failure: Secondary | ICD-10-CM | POA: Insufficient documentation

## 2023-03-23 DIAGNOSIS — I447 Left bundle-branch block, unspecified: Secondary | ICD-10-CM | POA: Insufficient documentation

## 2023-03-23 DIAGNOSIS — I429 Cardiomyopathy, unspecified: Secondary | ICD-10-CM

## 2023-03-23 DIAGNOSIS — I428 Other cardiomyopathies: Secondary | ICD-10-CM | POA: Diagnosis present

## 2023-03-23 HISTORY — PX: BIV ICD INSERTION CRT-D: EP1195

## 2023-03-23 LAB — GLUCOSE, CAPILLARY: Glucose-Capillary: 94 mg/dL (ref 70–99)

## 2023-03-23 SURGERY — BIV ICD INSERTION CRT-D

## 2023-03-23 MED ORDER — VANCOMYCIN HCL 1000 MG IV SOLR
INTRAVENOUS | Status: DC
  Filled 2023-03-23: qty 20

## 2023-03-23 MED ORDER — SODIUM CHLORIDE 0.9 % IV SOLN
INTRAVENOUS | Status: AC
  Administered 2023-03-23: 80 mg
  Filled 2023-03-23: qty 2

## 2023-03-23 MED ORDER — VANCOMYCIN HCL IN DEXTROSE 1-5 GM/200ML-% IV SOLN
INTRAVENOUS | Status: AC
  Administered 2023-03-23: 1000 mg via INTRAVENOUS
  Filled 2023-03-23: qty 200

## 2023-03-23 MED ORDER — MIDAZOLAM HCL 5 MG/5ML IJ SOLN
INTRAMUSCULAR | Status: AC
  Filled 2023-03-23: qty 5

## 2023-03-23 MED ORDER — SODIUM CHLORIDE 0.9 % IV SOLN: INTRAVENOUS | Status: DC

## 2023-03-23 MED ORDER — VANCOMYCIN HCL IN DEXTROSE 1-5 GM/200ML-% IV SOLN: 1000.0000 mg | Freq: Two times a day (BID) | INTRAVENOUS | Status: DC

## 2023-03-23 MED ORDER — VANCOMYCIN HCL IN DEXTROSE 1-5 GM/200ML-% IV SOLN: 1000.0000 mg | Freq: Once | INTRAVENOUS | Status: AC

## 2023-03-23 MED ORDER — CEFAZOLIN SODIUM-DEXTROSE 2-4 GM/100ML-% IV SOLN: 2.0000 g | INTRAVENOUS | Status: DC

## 2023-03-23 MED ORDER — FENTANYL CITRATE (PF) 100 MCG/2ML IJ SOLN
25.0000 ug | Freq: Once | INTRAMUSCULAR | Status: AC
  Administered 2023-03-23: 25 ug via INTRAVENOUS
  Filled 2023-03-23: qty 2

## 2023-03-23 MED ORDER — SODIUM CHLORIDE 0.9 % IV SOLN: 80.0000 mg | INTRAVENOUS | Status: AC

## 2023-03-23 MED ORDER — FENTANYL CITRATE (PF) 100 MCG/2ML IJ SOLN
INTRAMUSCULAR | Status: AC
  Filled 2023-03-23: qty 2

## 2023-03-23 MED ORDER — CEFAZOLIN SODIUM-DEXTROSE 2-4 GM/100ML-% IV SOLN
INTRAVENOUS | Status: AC
  Filled 2023-03-23: qty 100

## 2023-03-23 MED ORDER — LIDOCAINE HCL 1 % IJ SOLN
INTRAMUSCULAR | Status: AC
  Filled 2023-03-23: qty 20

## 2023-03-23 MED ORDER — LIDOCAINE HCL 1 % IJ SOLN
INTRAMUSCULAR | Status: AC
  Filled 2023-03-23: qty 60

## 2023-03-23 MED ORDER — LIDOCAINE HCL (PF) 1 % IJ SOLN
INTRAMUSCULAR | Status: DC | PRN
  Administered 2023-03-23: 50 mL

## 2023-03-23 MED ORDER — ACETAMINOPHEN-CODEINE 300-30 MG PO TABS: 1.0000 | ORAL_TABLET | Freq: Once | ORAL | Status: DC

## 2023-03-23 MED ORDER — ONDANSETRON HCL 4 MG/2ML IJ SOLN
4.0000 mg | Freq: Four times a day (QID) | INTRAMUSCULAR | Status: DC | PRN
Start: 2023-03-23 — End: 2023-03-24

## 2023-03-23 MED ORDER — POVIDONE-IODINE 10 % EX SWAB
2.0000 | Freq: Once | CUTANEOUS | Status: AC
  Administered 2023-03-23: 2 via TOPICAL

## 2023-03-23 MED ORDER — MIDAZOLAM HCL 5 MG/5ML IJ SOLN
INTRAMUSCULAR | Status: DC | PRN
  Administered 2023-03-23 (×4): 1 mg via INTRAVENOUS

## 2023-03-23 MED ORDER — IOHEXOL 350 MG/ML SOLN
INTRAVENOUS | Status: DC | PRN
  Administered 2023-03-23: 10 mL

## 2023-03-23 MED ORDER — FENTANYL CITRATE (PF) 100 MCG/2ML IJ SOLN
INTRAMUSCULAR | Status: DC | PRN
  Administered 2023-03-23 (×4): 25 ug via INTRAVENOUS

## 2023-03-23 MED ORDER — FENTANYL CITRATE (PF) 100 MCG/2ML IJ SOLN: 25.0000 ug | Freq: Once | INTRAMUSCULAR | Status: DC

## 2023-03-23 MED ORDER — CHLORHEXIDINE GLUCONATE 4 % EX SOLN
4.0000 | Freq: Once | CUTANEOUS | Status: DC
  Filled 2023-03-23: qty 60

## 2023-03-23 MED ORDER — TRAMADOL HCL 50 MG PO TABS
50.0000 mg | ORAL_TABLET | Freq: Once | ORAL | Status: AC
  Administered 2023-03-23: 50 mg via ORAL
  Filled 2023-03-23: qty 1

## 2023-03-23 SURGICAL SUPPLY — 20 items
BALLN COR SINUS VENO 6FR 80 (BALLOONS) ×1 IMPLANT
BALLOON COR SINUS VENO 6FR 80 (BALLOONS) IMPLANT
CABLE SURGICAL S-101-97-12 (CABLE) ×1 IMPLANT
CATH ATTAIN COM SURV 6250V-EH (CATHETERS) IMPLANT
CATH ATTAIN COM SURV 6250V-MB2 (CATHETERS) IMPLANT
ICD COBALT XT QUAD CRT DTPA2QQ (ICD Generator) IMPLANT
KIT ESSENTIALS PG (KITS) IMPLANT
LEAD ATTAIN PERFORMA S 4598-88 (Lead) IMPLANT
LEAD CAPSURE NOVUS 5076-52CM (Lead) IMPLANT
LEAD SPRINT QUAT SEC 6935M-62 (Lead) IMPLANT
PAD DEFIB RADIO PHYSIO CONN (PAD) ×1 IMPLANT
SHEATH 7FR PRELUDE SNAP 13 (SHEATH) IMPLANT
SHEATH 9.5FR PRELUDE SNAP 13 (SHEATH) IMPLANT
SHEATH 9FR PRELUDE SNAP 13 (SHEATH) IMPLANT
SHEATH PROBE COVER 6X72 (BAG) IMPLANT
SLITTER 6232ADJ (MISCELLANEOUS) IMPLANT
TRAY PACEMAKER INSERTION (PACKS) ×1 IMPLANT
WIRE ACUITY WHISPER EDS 4648 (WIRE) IMPLANT
WIRE HI TORQ VERSACORE-J 145CM (WIRE) IMPLANT
WIRE MAILMAN 182CM (WIRE) IMPLANT

## 2023-03-23 NOTE — Telephone Encounter (Signed)
Patient wants to discuss questions she has regarding upcoming procedure.

## 2023-03-23 NOTE — Discharge Instructions (Signed)
After Your ICD (Implantable Cardiac Defibrillator)   You have a Medtronic ICD  ACTIVITY Do not lift your arm above shoulder height for 1 week after your procedure. After 7 days, you may progress as below.  You should remove your sling 24 hours after your procedure, unless otherwise instructed by your provider.     Thursday March 30, 2023  Friday March 31, 2023 Saturday April 01, 2023 Sunday April 02, 2023   Do not lift, push, pull, or carry anything over 10 pounds with the affected arm until 6 weeks (Thursday May 04, 2023 ) after your procedure.   You may drive AFTER your wound check, unless you have been told otherwise by your provider.   Ask your healthcare provider when you can go back to work   INCISION/Dressing Eliquis, should be resumed. 03/28/2023  large square, outer bandage is left in place, this can be removed TOMORROW AT 6:00 PM. Do not remove steri-strips or glue as below.   Monitor your defibrillator site for redness, swelling, and drainage. Call the device clinic at [phone] if you experience these symptoms or fever/chills.  If your incision is sealed with Steri-strips or staples, you may shower 7 days after your procedure or when told by your provider. Do not remove the steri-strips or let the shower hit directly on your site. You may wash around your site with soap and water.    If you were discharged in a sling, please do not wear this during the day more than 48 hours after your surgery unless otherwise instructed. This may increase the risk of stiffness and soreness in your shoulder.   Avoid lotions, ointments, or perfumes over your incision until it is well-healed.  You may use a hot tub or a pool AFTER your wound check appointment if the incision is completely closed.  Your ICD is designed to protect you from life threatening heart rhythms. Because of this, you may receive a shock.   1 shock with no symptoms:  Call the office during  business hours. 1 shock with symptoms (chest pain, chest pressure, dizziness, lightheadedness, shortness of breath, overall feeling unwell):  Call 911. If you experience 2 or more shocks in 24 hours:  Call 911. If you receive a shock, you should not drive for 6 months per the [place] DMV IF you receive appropriate therapy from your ICD.   ICD Alerts:  Some alerts are vibratory and others beep. These are NOT emergencies. Please call our office to let us know. If this occurs at night or on weekends, it can wait until the next business day. Send a remote transmission.  If your device is capable of reading fluid status (for heart failure), you will be offered monthly monitoring to review this with you.   DEVICE MANAGEMENT Remote monitoring is used to monitor your ICD from home. This monitoring is scheduled every 91 days by our office. It allows [place] to keep an eye on the functioning of your device to ensure it is working properly. You will routinely see your Electrophysiologist annually (more often if necessary).   You should receive your ID card for your new device in 4-8 weeks. Keep this card with you at all times once received. Consider wearing a medical alert bracelet or necklace.  Your ICD  may be MRI compatible. This will be discussed at your next office visit/wound check.  You should avoid contact with strong electric or magnetic fields.   Do not use amateur (ham) radio equipment or  electric (arc) welding torches. MP3 player headphones with magnets should not be used. Some devices are safe to use if held at least 12 inches (30 cm) from your defibrillator. These include power tools, lawn mowers, and speakers. If you are unsure if something is safe to use, ask your health care provider.  When using your cell phone, hold it to the ear that is on the opposite side from the defibrillator. Do not leave your cell phone in a pocket over the defibrillator.  You may safely use electric blankets, heating pads,  computers, and microwave ovens.  Call the office right away if: You have chest pain. You feel more than one shock. You feel more short of breath than you have felt before. You feel more light-headed than you have felt before. Your incision starts to open up.  This information is not intended to replace advice given to you by your health care provider. Make sure you discuss any questions you have with your health care provider.

## 2023-03-23 NOTE — Telephone Encounter (Signed)
I returned pt's call and she was worried because she couldn't get her fake nail off of her thumb. She did get all of her other ones off so I told her it should not be an issue.

## 2023-03-23 NOTE — H&P (Signed)
  Electrophysiology Office Note:   Date:  03/23/2023  ID:  Kendra Griffith, DOB 11/10/1965, MRN [id number]  Primary Cardiologist: None Electrophysiologist: None      History of Present Illness:   Kendra Griffith is a 57 y.o. female with h/o hypertension, diabetes, tobacco abuse, anxiety, bipolar disorder, alcohol abuse, chronic systolic heart failure seen today for  for Electrophysiology evaluation of heart failure at the request of Marca Ancona.    She was admitted the hospital May 2024 with new onset heart failure.  Ejection fraction was reduced at less than 20%.  MRI August 2024 showed an ejection fraction of 25%.  There was no delayed enhancement.  Today, denies symptoms of palpitations, chest pain, shortness of breath, orthopnea, PND, lower extremity edema, claudication, dizziness, presyncope, syncope, bleeding, or neurologic sequela. The patient is tolerating medications without difficulties. Plan CRT-D implant today.   EP Information / Studies Reviewed:    EKG is not ordered today. EKG from 12/15/22 reviewed which showed sinus rhythm, LBBB        Risk Assessment/Calculations:     hysical Exam:   VS:  LMP 03/12/2019 (Approximate)    Wt Readings from Last 3 Encounters:  02/24/23 71 kg  02/14/23 72 kg  02/01/23 70.6 kg    GEN: No acute distress.   Neck: No JVD Cardiac: RRR, no murmurs, rubs, or gallops.  Respiratory: decreased BS bases bilaterally. GI: Soft, nontender, non-distended  MS: No edema; No deformity. Neuro:  Nonfocal  Skin: warm and dry,  Psych: Normal affect    ASSESSMENT AND PLAN:    1.  Chronic systolic heart failure:  ICD Criteria  Current LVEF:33%. Within 12 months prior to implant: Yes   Heart failure history: Yes, Class II  Cardiomyopathy history: Yes, Non-Ischemic Cardiomyopathy.  Atrial Fibrillation/Atrial Flutter: No.  Ventricular tachycardia history: No.  Cardiac arrest history: No.  History of syndromes with risk of sudden death:  No.  Previous ICD: No.  Current ICD indication: Primary  PPM indication: LBBB  Class I or II Bradycardia indication present: No  Beta Blocker therapy for 3 or more months: No, medical reason.  Ace Inhibitor/ARB therapy for 3 or more months: Yes, prescribed.    I have seen Kendra Griffith is a 57 y.o. femalepre-procedural and has been referred by Bensimhon for consideration of ICD implant for primary prevention of sudden death.  The patient's chart has been reviewed and they meet criteria for ICD implant.  I have had a thorough discussion with the patient reviewing options.  The patient and their family (if available) have had opportunities to ask questions and have them answered. The patient and I have decided together through the [facility] Heart Care Share Decision Support Tool to implant ICD at this time.  Risks, benefits, alternatives to ICD implantation were discussed in detail with the patient today. The patient  understands that the risks include but are not limited to bleeding, infection, pneumothorax, perforation, tamponade, vascular damage, renal failure, MI, stroke, death, inappropriate shocks, and lead dislodgement and  wishes to proceed.

## 2023-03-24 ENCOUNTER — Encounter (HOSPITAL_COMMUNITY): Payer: Self-pay | Admitting: Cardiology

## 2023-03-24 ENCOUNTER — Telehealth: Payer: Self-pay

## 2023-03-24 NOTE — Telephone Encounter (Signed)
Follow-up after same day discharge: Implant date: 03/23/2023 MD: Loman Brooklyn, MD Device: Medtronic ICD Cobalt Location: Left Chest   Wound check visit: 04/13/2023 90 day MD follow-up: 06/26/2023  Remote Transmission received:03/23/2023  Dressing/sling removed: n/a  Confirm OAC restart on: n/a  Please continue to monitor your cardiac device site for redness, swelling, and drainage. Call the device clinic at [phone] if you experience these symptoms, fever/chills, or have questions about your device.   Remote monitoring is used to monitor your cardiac device from home. This monitoring is scheduled every 91 days by our office. It allows [place] to keep an eye on the functioning of your device to ensure it is working properly.

## 2023-03-27 MED FILL — Lidocaine HCl Local Inj 1%: INTRAMUSCULAR | Qty: 20 | Status: AC

## 2023-03-27 MED FILL — Lidocaine HCl Local Inj 1%: INTRAMUSCULAR | Qty: 50 | Status: AC

## 2023-03-29 NOTE — Telephone Encounter (Signed)
LMOVM for the 2nd time.

## 2023-03-30 NOTE — Telephone Encounter (Signed)
I spoke with the pt and she was able to get the dressing off. She has started her regular medicines. She asked about her stitches? She said it was supposed to be dissolve on its on. I told her that if she was told they would dissolve then they would. I told her the nurse will take a look at the wound check appointment.

## 2023-04-12 NOTE — Patient Instructions (Signed)
   After Your ICD (Implantable Cardiac Defibrillator)    Monitor your defibrillator site for redness, swelling, and drainage. Call the device clinic at [phone] if you experience these symptoms or fever/chills.  Your incision was closed with Steri-strips or staples:  You may shower 7 days after your procedure and wash your incision with soap and water. Avoid lotions, ointments, or perfumes over your incision until it is well-healed.  You may use a hot tub or a pool after your wound check appointment if the incision is completely closed.  Do not lift, push or pull greater than 10 pounds with the affected arm until 6 weeks after your procedure. UNTIL AFTER JANUARY 23RD There are no other restrictions in arm movement after your wound check appointment.  Your ICD is designed to protect you from life threatening heart rhythms. Because of this, you may receive a shock.   1 shock with no symptoms:  Call the office during business hours. 1 shock with symptoms (chest pain, chest pressure, dizziness, lightheadedness, shortness of breath, overall feeling unwell):  Call 911. If you experience 2 or more shocks in 24 hours:  Call 911. If you receive a shock, you should not drive.  [place] DMV - no driving for 6 months if you receive appropriate therapy from your ICD.   ICD Alerts:  Some alerts are vibratory and others beep. These are NOT emergencies. Please call our office to let us  know. If this occurs at night or on weekends, it can wait until the next business day. Send a remote transmission.  If your device is capable of reading fluid status (for heart failure), you will be offered monthly monitoring to review this with you.   Remote monitoring is used to monitor your ICD from home. This monitoring is scheduled every 91 days by our office. It allows us  to keep an eye on the functioning of your device to ensure it is working properly. You will routinely see your Electrophysiologist annually (more often  if necessary).

## 2023-04-13 ENCOUNTER — Telehealth: Payer: Self-pay

## 2023-04-13 ENCOUNTER — Encounter: Payer: Self-pay | Admitting: Podiatry

## 2023-04-13 ENCOUNTER — Ambulatory Visit (INDEPENDENT_AMBULATORY_CARE_PROVIDER_SITE_OTHER): Payer: Medicaid Other | Admitting: Podiatry

## 2023-04-13 ENCOUNTER — Ambulatory Visit: Payer: Medicaid Other | Attending: Cardiovascular Disease

## 2023-04-13 VITALS — Ht 65.0 in | Wt 165.0 lb

## 2023-04-13 DIAGNOSIS — I447 Left bundle-branch block, unspecified: Secondary | ICD-10-CM | POA: Diagnosis not present

## 2023-04-13 DIAGNOSIS — E1142 Type 2 diabetes mellitus with diabetic polyneuropathy: Secondary | ICD-10-CM | POA: Diagnosis not present

## 2023-04-13 DIAGNOSIS — M216X2 Other acquired deformities of left foot: Secondary | ICD-10-CM

## 2023-04-13 DIAGNOSIS — I255 Ischemic cardiomyopathy: Secondary | ICD-10-CM

## 2023-04-13 DIAGNOSIS — D492 Neoplasm of unspecified behavior of bone, soft tissue, and skin: Secondary | ICD-10-CM

## 2023-04-13 LAB — CUP PACEART INCLINIC DEVICE CHECK
Date Time Interrogation Session: 20250102151610
Implantable Lead Connection Status: 753985
Implantable Lead Connection Status: 753985
Implantable Lead Connection Status: 753985
Implantable Lead Implant Date: 20241212
Implantable Lead Implant Date: 20241212
Implantable Lead Implant Date: 20241212
Implantable Lead Location: 753858
Implantable Lead Location: 753859
Implantable Lead Location: 753860
Implantable Lead Model: 4598
Implantable Lead Model: 5076
Implantable Pulse Generator Implant Date: 20241212

## 2023-04-13 NOTE — Progress Notes (Signed)
 This patient returns to my office for at risk foot care.  This patient requires this care by a professional since this patient will be at risk due to having diabetes.  This patient is unable to trim her callus herself since the patient cannot reach her feet.These callus  are painful walking and wearing shoes.  This patient presents for at risk foot care today.  General Appearance  Alert, conversant and in no acute stress.  Vascular  Dorsalis pedis and posterior tibial  pulses are palpable  bilaterally.  Capillary return is within normal limits  bilaterally. Temperature is within normal limits  bilaterally.  Neurologic  Senn-Weinstein monofilament wire test within normal limits  bilaterally. Muscle power within normal limits bilaterally.  Nails Thick disfigured discolored nails with subungual debris  from hallux to fifth toes bilaterally. No evidence of bacterial infection or drainage bilaterally.  Orthopedic  No limitations of motion  feet .  No crepitus or effusions noted.  No bony pathology or digital deformities noted. Listers corn fifth  B/L.  Plantar flexed metatarsal 2.5 left foot.  Skin  normotropic skin with no porokeratosis noted bilaterally.  No signs of infections or ulcers noted.  Porokeratosis sub 2,5.  Porokeratosis 1,5 right foot.  Plantar flexed metatarsal  left foot.  Consent was obtained for treatment procedures.    Debridement of porokeratosis with # 15 blade and dremel tool.  Paperwork given to this patient for diabetic shoes.   Return office visit   10 weeks                  Told patient to return for periodic foot care and evaluation due to potential at risk complications.   Cordella Bold DPM

## 2023-04-13 NOTE — Telephone Encounter (Signed)
 Called patient today Lm for her stating RX was sent to [facility] for her DM shoes and inserts as we can no longer bill the [facility] plans  Addison Bailey CPed, CFo, CFm

## 2023-04-13 NOTE — Progress Notes (Addendum)
 Wound check appointment. Steri-strips removed. Wound without redness or edema. Incision edges approximated, wound well healed. Normal device function. BIVP effectively 97%. Thresholds, sensing, and impedances consistent with implant measurements. Device programmed at 3.5V/adaptive on for extra safety margin until 3 month visit. Histogram distribution appropriate for patient and level of activity. No mode switches or ventricular arrhythmias noted. Patient educated about wound care, arm mobility, lifting restrictions, shock plan. ROV in 3 months with implanting physician.

## 2023-04-17 ENCOUNTER — Other Ambulatory Visit: Payer: Self-pay

## 2023-04-18 ENCOUNTER — Other Ambulatory Visit (HOSPITAL_COMMUNITY): Payer: Self-pay

## 2023-04-18 ENCOUNTER — Other Ambulatory Visit: Payer: Self-pay

## 2023-04-24 ENCOUNTER — Other Ambulatory Visit (HOSPITAL_COMMUNITY): Payer: Self-pay

## 2023-05-05 ENCOUNTER — Telehealth: Payer: Self-pay

## 2023-05-05 NOTE — Telephone Encounter (Signed)
Pt c/o some int. stinging around incision. Per pt report appears CDI w/ no redness/swelling/drainage. Explained its likely nerves beginning to heal and educated on what to watch for. Epigastric pain likely gas pain/GERD and advised pt to take prescribed prilosec/tums and gasx and see if it improves. Intermittent and not severe. ED precautions given in the setting of atypical chest pain that worsens or is new. Pt verbalized understanding. Given info on weekend on call provider. Manual transmission received. Looks to be normal device function with a couple PVCs on presenting.

## 2023-05-05 NOTE — Telephone Encounter (Signed)
The pt called because she having some stinging feeling in her chest. She not sure if it is gas. I let her speak with Baird Lyons, rn.

## 2023-05-09 ENCOUNTER — Encounter: Payer: Self-pay | Admitting: Family Medicine

## 2023-05-09 ENCOUNTER — Telehealth (HOSPITAL_BASED_OUTPATIENT_CLINIC_OR_DEPARTMENT_OTHER): Payer: Medicaid Other | Admitting: Family Medicine

## 2023-05-09 DIAGNOSIS — Z7984 Long term (current) use of oral hypoglycemic drugs: Secondary | ICD-10-CM | POA: Diagnosis not present

## 2023-05-09 DIAGNOSIS — E1142 Type 2 diabetes mellitus with diabetic polyneuropathy: Secondary | ICD-10-CM

## 2023-05-09 NOTE — Patient Instructions (Signed)
Nerve Damage From Diabetes (Diabetic Neuropathy): What to Know Diabetic neuropathy is when diabetes causes damage to your nerves. Over time, people with diabetes can get nerve damage throughout the body.  There are different types of diabetic neuropathy: Peripheral neuropathy. This is the most common type. It damages the peripheral nerves. These are nerves that send signals between the spinal cord and other parts of the body. This type usually affects the feet, legs, hands, and arms. Autonomic neuropathy. This type causes damage to autonomic nerves. These nerves control things that your body does automatically and that you don't control. This includes the heartbeat, body temperature, blood pressure, peeing, digestion, sweating, and sexual function. It can also affect your body's response to changes in blood sugar (glucose). Focal neuropathy. This type affects one area of the body, like an arm, a leg, or the face. It may involve one nerve or a small group of nerves. It can be painful and hard to predict. It occurs most often in older adults with diabetes. This type may come on suddenly. It usually gets better over time and doesn't cause long-term problems. Proximal neuropathy. This type affects the nerves of the thighs, hips, butt, or legs. It causes very bad pain, weakness, and muscle death, usually in the thigh muscles. It's more common among older men and people who have type 2 diabetes. The time it takes to recover may vary. What are the causes? Peripheral, autonomic, and focal neuropathies are caused by diabetes that's not well controlled with treatment.  The cause of proximal neuropathy isn't known. It may be linked to irritation and swelling caused by not controlling blood sugar levels. What are the signs or symptoms? Peripheral neuropathy The nerve damage happens slowly over time. When the nerves of the feet and legs no longer work, you may have: Burning, stabbing, or aching pain in the legs or  feet. Cramping in the legs or feet. Loss of feeling (numbness) and not being able to feel pressure or pain in the feet. This can lead to: Thick calluses or sores, also called ulcers, on areas of constant pressure. Not being able to feel temperature changes. Changes in the shape of the feet. Muscle weakness. Loss of balance or coordination. Autonomic neuropathy Symptoms vary depending on which nerves are affected. Symptoms may include: Problems with digestion, like: Throwing up or feeling like you may throw up. Poor appetite. Bloating. Watery poop (diarrhea) or trouble pooping (constipation). Trouble swallowing. Losing weight without trying to. Problems with the heart, blood, and lungs, such as: Feeling dizzy, especially when standing up. Fainting. Shortness of breath. Irregular heartbeat. Bladder problems, such as: Trouble starting or stopping when you pee. Leaking pee. Trouble emptying the bladder. Urinary tract infections (UTIs). Problems with other body functions, such as: Sweat. You may sweat too much or too little. Temperature. You might get hot easily. Or, you might feel cold more than usual. Sexual function. Males may not be able to get or keep an erection. Females may have vaginal dryness and trouble with arousal. Focal neuropathy Symptoms affect only one area of the body. Common symptoms include: Numbness or tingling. Burning pain. A prickling feeling. Very sensitive skin. Weakness. Not being able to move (paralysis). Muscle twitching. Muscles getting smaller, or wasting. Poor coordination. Double or blurred vision. Proximal neuropathy Sudden, very bad pain in the hip, thigh, or butt. Pain may spread from the back into the legs. This is called sciatica. Pain and numbness in the arms and legs. Tingling. Losing control of peeing  and pooping. Weakness and wasting of thigh muscles. Trouble getting up from a seated position. Swelling of the belly. Losing weight  without trying to. How is this diagnosed? Diagnosis varies depending on the type of neuropathy your health care provider thinks you have. It may include: A neurologic exam. This exam checks: Your reflexes. How you move. What you can feel. Blood tests. Lumbar puncture. This tests the fluid that surrounds the spinal cord. Imaging tests, such as: A CT scan. An MRI. Tests on the nerves, such as: Electromyogram (EMG). This checks the nerves that control muscles. Nerve conduction study. This test checks how quickly signals pass through your nerves. Biopsy. This is when a small piece of a nerve is removed for testing. For autonomic neuropathy, you may have tests to check things like: Your blood pressure. Your heart rate. Your breathing. Your digestion. Your bladder. There's no specific test for proximal neuropathy. But you may have tests to rule out other causes of your symptoms. How is this treated? The goal of treatment is to keep nerve damage from getting worse. Treatment may include: Following your diabetes management plan. This will help keep your blood sugar level and your A1C level within your target range. This is the most important treatment. Taking pain medicine. Follow these instructions at home: Diabetes management Follow your diabetes management plan as told by your provider. Check your blood sugar levels. Keep your blood sugar in your target range. Have your A1C level checked at least 2 times a year, or as often as told. Take your medicines only as told. This includes insulin and diabetes medicine. Lifestyle  Do not smoke, vape, or use nicotine or tobacco. Be physically active every day. Include strength training and balance exercises. Follow a healthy meal plan. Work with your provider to manage your blood pressure. General instructions Ask your provider if it's safe to drive or use machines while taking your medicine. Check your skin and feet every day  for: Cuts. Bruises. Redness. Blisters. Sores. Contact a health care provider if: You have any new symptoms. Your symptoms get worse. Your symptoms don't get better with treatment. Get help right away if: You have sudden weakness or loss of coordination. You have trouble speaking. You have pain or pressure in your chest. You're not able to move a part of your body all of a sudden. These symptoms may be an emergency. Call 911 right away. Do not wait to see if the symptoms will go away. Do not drive yourself to the hospital. This information is not intended to replace advice given to you by your health care provider. Make sure you discuss any questions you have with your health care provider. Document Revised: 11/30/2022 Document Reviewed: 11/30/2022 Elsevier Patient Education  2024 [employer].

## 2023-05-09 NOTE — Progress Notes (Signed)
Virtual Visit via Video Note  I connected with Kendra Griffith, on 05/09/2023 at 1:38 PM by video enabled telemedicine device and verified that I am speaking with the correct person using two identifiers.   Consent: I discussed the limitations, risks, security and privacy concerns of performing an evaluation and management service by telemedicine and the availability of in person appointments. I also discussed with the patient that there may be a patient responsible charge related to this service. The patient expressed understanding and agreed to proceed.   Location of Patient: Home  Location of Provider: Clinic   Persons participating in Telemedicine visit: Kendra Griffith Dr. Alvis Lemmings     History of Present Illness: Kendra Griffith is a 58 y.o. year old female patient of Kendra Denver, FNP with type 2 diabetes mellitus, hypertension, diabetic neuropathy. Discussed the use of AI scribe software for clinical note transcription with the patient, who gave verbal consent to proceed.  She presents for a consultation regarding diabetic shoes. She reports having calluses on her feet and experiencing numbness and a pins and needles sensation. She has been suffering from chronic foot pain for over twenty years and recently underwent surgery.   For her diabetes, she is on [place] 10mg  and her last A1c, a couple of months ago, was 6.1 in 08/2022. She is due for a repeat A1c.   Past Medical History:  Diagnosis Date   ABSCESS, TOOTH 08/20/2009   Qualifier: Diagnosis of  By: Huntley Dec, Scott     Anemia    Anxiety    Bipolar disorder (HCC)    Blood transfusion without reported diagnosis    childbirth   Chronic pain    feet and back   Clotting disorder (HCC)    childbirth   Dental caries    lesion soft palate   Depression    Diabetes mellitus without complication (HCC)    prediabetes   GERD (gastroesophageal reflux disease)    Hypertension    Injury    right side from jumping off  bunkbed   Pre-diabetes    Sickle cell trait (HCC)    Wears glasses    Allergies  Allergen Reactions   Amoxicillin     Pain Did it involve swelling of the face/tongue/throat, SOB, or low BP? No Did it involve sudden or severe rash/hives, skin peeling, or any reaction on the inside of your mouth or nose? No Did you need to seek medical attention at a hospital or doctor's office? Yes When did it last happen?      5 years ago If all above answers are "NO", may proceed with cephalosporin use.    Darvocet [Propoxyphene N-Acetaminophen] Nausea And Vomiting    Makes her jittery   Hydrocodone-Acetaminophen Nausea And Vomiting    Upset stomach   Valium [Diazepam] Other (See Comments)    Makes pt. Feel "out of wack"    Current Outpatient Medications on File Prior to Visit  Medication Sig Dispense Refill   acetaminophen-codeine (TYLENOL #3) 300-30 MG tablet Take 1-2 tablets by mouth every 4 (four) hours as needed for moderate pain (pain score 4-6). 60 tablet 0   albuterol (VENTOLIN HFA) 108 (90 Base) MCG/ACT inhaler Inhale 2 puffs into the lungs every 6 (six) hours as needed for wheezing or shortness of breath. 18 g 0   amitriptyline (ELAVIL) 75 MG tablet Take 1 tablet (75 mg total) by mouth at bedtime for pain and depression (Patient taking differently: Take 75 mg by mouth  at bedtime as needed for sleep.) 90 tablet 1   apixaban (ELIQUIS) 5 MG TABS tablet Take 1 tablet (5 mg total) by mouth 2 (two) times daily. 60 tablet 6   Biotin 5000 MCG CAPS Take 5,000 mcg by mouth daily.     Blood Pressure Monitor DEVI Please provide patient with insurance approved blood pressure monitor 1 each 0   cholecalciferol (VITAMIN D3) 25 MCG (1000 UT) tablet Take 1,000 Units by mouth daily.     dapagliflozin propanediol (FARXIGA) 10 MG TABS tablet Take 1 tablet (10 mg total) by mouth daily. 30 tablet 6   famotidine (PEPCID) 20 MG tablet Take 1 tablet (20 mg total) by mouth 2 (two) times daily for heartburn (Patient  taking differently: Take 20 mg by mouth every morning.) 180 tablet 1   gabapentin (NEURONTIN) 300 MG capsule Take 1 capsule (300 mg total) by mouth 2 (two) times daily. (Patient taking differently: Take 300 mg by mouth 3 (three) times daily as needed (pain).) 180 capsule 3   hydrOXYzine (ATARAX) 50 MG tablet Take 1 tablet (50 mg total) by mouth 3 (three) times daily as needed. Please deliver (Patient taking differently: Take 25 mg by mouth 3 (three) times daily as needed for anxiety. Please deliver) 90 tablet 2   losartan (COZAAR) 25 MG tablet Take 0.5 tablets (12.5 mg total) by mouth at bedtime. 45 tablet 3   Menthol, Topical Analgesic, (BIOFREEZE EX) Apply 1 spray topically daily as needed (foot pain). Menthol     Misc. Devices MISC Please provide patient with incontinence pads or disposable underwear or choice. N39.3 1 each PRN   Multiple Vitamins-Minerals (MULTIVITAMIN WITH MINERALS) tablet Take 1 tablet by mouth daily.     nicotine (NICODERM CQ - DOSED IN MG/24 HR) 7 mg/24hr patch Place 1 patch (7 mg total) onto the skin daily. 14 patch 0   omeprazole (PRILOSEC) 20 MG capsule Take 1 capsule (20 mg total) by mouth daily for heartburn 90 capsule 1   potassium chloride SA (KLOR-CON M) 20 MEQ tablet Take 2 tablets (40 mEq total) by mouth daily. (Patient taking differently: Take 20 mEq by mouth daily.) 60 tablet 6   rosuvastatin (CRESTOR) 5 MG tablet Take 1 tablet (5 mg total) by mouth daily for cholesterol 90 tablet 0   spironolactone (ALDACTONE) 25 MG tablet Take 1 tablet (25 mg total) by mouth daily. 30 tablet 3   tetrahydrozoline 0.05 % ophthalmic solution Place 1 drop into both eyes daily as needed (itching /red eyes).     torsemide (DEMADEX) 20 MG tablet Take 2 tablets (40 mg total) by mouth daily. (Patient taking differently: Take 20 mg by mouth daily.) 60 tablet 6   trolamine salicylate (ASPERCREME) 10 % cream Apply 1 Application topically daily as needed (Foot pain). With lidocaine     urea  (CARMOL) 40 % CREA Apply 1 application  topically daily as needed (Foot pain).     vitamin B-12 (CYANOCOBALAMIN) 500 MCG tablet Take 500 mcg by mouth daily.     No current facility-administered medications on file prior to visit.    ROS: See HPI  Observations/Objective: Awake, alert, oriented x3 Not in acute distress Normal mood      Latest Ref Rng & Units 02/28/2023    8:46 AM 02/14/2023    9:11 AM 12/15/2022   11:46 AM  CMP  Glucose 70 - 99 mg/dL 161  92  096   BUN 6 - 20 mg/dL 16  12  13  Creatinine 0.44 - 1.00 mg/dL 1.61  0.96  0.45   Sodium 135 - 145 mmol/L 139  140  136   Potassium 3.5 - 5.1 mmol/L 4.1  3.9  4.0   Chloride 98 - 111 mmol/L 104  100  100   CO2 22 - 32 mmol/L 28  33  28   Calcium 8.9 - 10.3 mg/dL 9.7  9.8  9.7     Lipid Panel     Component Value Date/Time   CHOL 77 08/27/2022 0645   CHOL 159 01/05/2022 0919   TRIG 75 08/27/2022 0645   HDL 20 (L) 08/27/2022 0645   HDL 65 01/05/2022 0919   CHOLHDL 3.9 08/27/2022 0645   VLDL 15 08/27/2022 0645   LDLCALC 42 08/27/2022 0645   LDLCALC 83 01/05/2022 0919   LABVLDL 11 01/05/2022 0919    Lab Results  Component Value Date   HGBA1C 6.1 (H) 08/27/2022     Assessment and Plan:     Diabetic Peripheral Neuropathy Chronic foot pain and calluses. No recent changes in sensation. Currently managed with Gabapentin. -Continue Gabapentin as prescribed.  Type 2 Diabetes Mellitus Last A1c was 6.1 in May 2024, currently managed with Farxiga 10mg  daily. No recent follow-up with PCP. -Schedule follow-up appointment with PCP for diabetes management. -Repeat A1c to ensure stable levels.  Diabetic Foot Care Calluses present, no ulcers. Patient sees podiatrist, Dr. Mitzi Hansen. Patient requires diabetic shoes. -Complete and fax form for diabetic shoes to [facility]. -Continue regular podiatry visits.        Follow Up Instructions: Clinic to schedule appointment with PCP for chronic disease  management   I discussed the assessment and treatment plan with the patient. The patient was provided an opportunity to ask questions and all were answered. The patient agreed with the plan and demonstrated an understanding of the instructions.   The patient was advised to call back or seek an in-person evaluation if the symptoms worsen or if the condition fails to improve as anticipated.     I provided 12 minutes total of Telehealth time during this encounter including median intraservice time, reviewing previous notes, investigations, ordering medications, medical decision making, coordinating care and patient verbalized understanding at the end of the visit.     Hoy Register, MD, FAAFP. [facility] [place], [place] [phone]   05/09/2023, 1:38 PM

## 2023-05-26 ENCOUNTER — Other Ambulatory Visit (HOSPITAL_COMMUNITY): Payer: Self-pay

## 2023-05-26 ENCOUNTER — Other Ambulatory Visit (HOSPITAL_COMMUNITY): Payer: Self-pay | Admitting: Cardiology

## 2023-05-26 ENCOUNTER — Other Ambulatory Visit: Payer: Self-pay | Admitting: Family Medicine

## 2023-05-26 ENCOUNTER — Other Ambulatory Visit: Payer: Self-pay

## 2023-05-26 DIAGNOSIS — E785 Hyperlipidemia, unspecified: Secondary | ICD-10-CM

## 2023-05-26 MED ORDER — ROSUVASTATIN CALCIUM 5 MG PO TABS
5.0000 mg | ORAL_TABLET | Freq: Every day | ORAL | 0 refills | Status: DC
  Filled 2023-05-26: qty 90, 90d supply, fill #0

## 2023-05-26 MED ORDER — SPIRONOLACTONE 25 MG PO TABS
25.0000 mg | ORAL_TABLET | Freq: Every day | ORAL | 1 refills | Status: DC
  Filled 2023-05-26: qty 30, 30d supply, fill #0
  Filled 2023-06-23: qty 30, 30d supply, fill #1

## 2023-05-26 MED ORDER — DAPAGLIFLOZIN PROPANEDIOL 10 MG PO TABS
10.0000 mg | ORAL_TABLET | Freq: Every day | ORAL | 1 refills | Status: DC
  Filled 2023-05-26: qty 30, 30d supply, fill #0
  Filled 2023-06-23: qty 30, 30d supply, fill #1

## 2023-05-26 NOTE — Telephone Encounter (Signed)
Requested by interface surescripts. Last labs 08/27/22.  Requested Prescriptions  Pending Prescriptions Disp Refills   rosuvastatin (CRESTOR) 5 MG tablet 90 tablet 0    Sig: Take 1 tablet (5 mg total) by mouth daily for cholesterol     Cardiovascular:  Antilipid - Statins 2 Failed - 05/26/2023  4:05 PM      Failed - Cr in normal range and within 360 days    Creat  Date Value Ref Range Status  01/21/2016 0.83 0.50 - 1.05 mg/dL Final    Comment:      For patients > or = 58 years of age: The upper reference limit for Creatinine is approximately 13% higher for people identified as African-American.      Creatinine, Ser  Date Value Ref Range Status  02/28/2023 1.01 (H) 0.44 - 1.00 mg/dL Final   Creatinine,U  Date Value Ref Range Status  07/30/2009 73.1 mg/dL Final    Comment:    See lab report for associated comment(s)   Creatinine, Urine  Date Value Ref Range Status  01/21/2016 166 20 - 320 mg/dL Final         Failed - Lipid Panel in normal range within the last 12 months    Cholesterol, Total  Date Value Ref Range Status  01/05/2022 159 100 - 199 mg/dL Final   Cholesterol  Date Value Ref Range Status  08/27/2022 77 0 - 200 mg/dL Final   LDL Chol Calc (NIH)  Date Value Ref Range Status  01/05/2022 83 0 - 99 mg/dL Final   LDL Cholesterol  Date Value Ref Range Status  08/27/2022 42 0 - 99 mg/dL Final    Comment:           Total Cholesterol/HDL:CHD Risk Coronary Heart Disease Risk Table                     Men   Women  1/2 Average Risk   3.4   3.3  Average Risk       5.0   4.4  2 X Average Risk   9.6   7.1  3 X Average Risk  23.4   11.0        Use the calculated Patient Ratio above and the CHD Risk Table to determine the patient's CHD Risk.        ATP III CLASSIFICATION (LDL):  <100     mg/dL   Optimal  161-096  mg/dL   Near or Above                    Optimal  130-159  mg/dL   Borderline  045-409  mg/dL   High  >811     mg/dL   Very High Performed at  [facility], [address] [place], [place] [address]    HDL  Date Value Ref Range Status  08/27/2022 20 (L) >40 mg/dL Final  29/56/2130 65 >86 mg/dL Final   Triglycerides  Date Value Ref Range Status  08/27/2022 75 <150 mg/dL Final         Passed - Patient is not pregnant      Passed - Valid encounter within last 12 months    Recent Outpatient Visits           2 weeks ago Type 2 diabetes mellitus with diabetic polyneuropathy, without long-term current use of insulin (HCC)   [facility] [place]. [facility]  Hoy Register, MD   4 months ago Primary hypertension   [facility] - [facility] Dept Of [place]. [facility] Claiborne Rigg, NP   7 months ago Hospital discharge follow-up   [facility] - A Dept Of [place]. [facility] Claiborne Rigg, NP   9 months ago Primary hypertension   [facility] Dept Of [place]. [facility] Claiborne Rigg, NP   10 months ago Hordeolum externum of left upper eyelid   [facility] - [facility] Dept Of [place]. [facility] [place], [place], [place]

## 2023-05-30 ENCOUNTER — Other Ambulatory Visit: Payer: Self-pay

## 2023-06-08 ENCOUNTER — Encounter: Payer: Self-pay | Admitting: Psychology

## 2023-06-08 ENCOUNTER — Encounter: Payer: Medicaid Other | Attending: Psychology | Admitting: Psychology

## 2023-06-08 DIAGNOSIS — R413 Other amnesia: Secondary | ICD-10-CM | POA: Diagnosis present

## 2023-06-08 DIAGNOSIS — G894 Chronic pain syndrome: Secondary | ICD-10-CM | POA: Diagnosis not present

## 2023-06-08 DIAGNOSIS — F3173 Bipolar disorder, in partial remission, most recent episode manic: Secondary | ICD-10-CM | POA: Diagnosis not present

## 2023-06-08 NOTE — Progress Notes (Signed)
 Neuropsychological Evaluation   Patient:  Kendra Griffith   DOB: June 05, 1965  MR Number: [id number]  Location: [facility] [address] [place], [address] [place] [address] Dept: [phone]  Start: 10 AM End: 11 AM  Provider/Observer:     Hershal Coria PsyD  Chief Complaint:      Chief Complaint  Patient presents with   Memory Loss   Other    Reason For Service:      Kendra Griffith is a 58 year old female referred for neuropsychological evaluation by her treating neurologist Ocie Doyne, MD due to reports of ongoing memory difficulties that have spanned the past 7 or 8 years.  The patient first began seeing neurology in 2019 due to generalized pain/chronic pain and memory issues.  At the time, her memory changes were suspected to be secondary to her mood disorder with a previous diagnosis of bipolar affective disorder and history of significant alcohol abuse.  Patient's sister was noted to have had a history of brain tumors requiring multiple surgeries.  Patient denies any previous traumatic brain injury, although she did have a concussive event when she was 33 or 58 years old after being dropped off of a brick wall by an older sister's boyfriend accidentally.  The patient has a history of significant anxiety and mood disturbance and has had episodes of significant psychosocial stressors acutely.  Patient takes Elavil at night to aid with sleep and help with some of her pain symptoms as well as taking gabapentin for pain and indirect help with mood stability.  Patient reports that her mood has been generally stable more recently.  Past medical history includes essential hypertension with new onset of congestive heart failure diagnosed recently (03/23/2023: Admission due to new onset heart failure admission and plan for ICD implant)..  Patient has a history of type 2 diabetes with diabetic  polyneuropathy without long-term use of insulin.  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  Patient has also been diagnosed with chronic pain syndrome and has a past history of substance abuse including significant alcohol consumption but has completely stopped drinking.  Patient reports that she stopped drinking in 2013.  Patient continues with tobacco use.   During today's clinical interview, the patient reports that she has noted difficulties with her memory, particularly over the past 7 or 8 years.  She describes her memory difficulties as forgetting where she put things around the house.  She describes most of her memory difficulties as episodic memory difficulties in nature.  She reports that cueing will help significantly and often when she forgets something like that she had started cooking that when something reminds her that she will remember the act itself.  The patient denies any expressive language changes or word finding difficulties.  Patient has had tremors associated with anxiety in the past but denies any current tremors.  Patient reports that her mood has been very stable more recently.  The patient has not been driving but stopped driving because she got a ticket that she felt was not her fault and stopped driving just to wait out the consequences of this ticket.  The patient reports that she could drive if she wanted to.  The patient denies any geographic disorientation or changes in visual spatial and visual construction type process.   The patient denies any known history of degenerative neurological conditions other than her sister who has been diagnosed  with multiple brain tumors with surgeries.  Patient reports that she is not sure about any previous seizures as there was a time with significant alcohol use and difficulties as a child but denies any recent seizures or known sick.  Only surgeries the patient has had have been around childbirth  with C-section.   Patient reports that she was dropped by sisters boyfriend when she was 25 or 44 years old having a concussive event but reports return to baseline within a couple of days.  She had an episode of measles as a child with a very high fever and severe headache but also returned to baseline.   Patient reports that sleeping has always been a problem and that she is often up most the night if she does not take amitriptyline.  Patient reports that amitriptyline helps some of the time but not always.  Patient denies any snoring or other indications of potential obstructive sleep apnea.  Patient reports that her appetite is good and that she eats a good quality diet.   Medical History:                         Past Medical History:  Diagnosis Date   ABSCESS, TOOTH 08/20/2009    Qualifier: Diagnosis of  By: Huntley Dec, Scott     Anemia     Anxiety     Bipolar disorder (HCC)     Blood transfusion without reported diagnosis      childbirth   Chronic pain      feet and back   Clotting disorder (HCC)      childbirth   Dental caries      lesion soft palate   Depression     Diabetes mellitus without complication (HCC)      prediabetes   GERD (gastroesophageal reflux disease)     Hypertension     Injury      right side from jumping off bunkbed   Pre-diabetes     Sickle cell trait (HCC)     Wears glasses                                                                 Patient Active Problem List    Diagnosis Date Noted   Acute LLE DVT (deep venous thrombosis) (HCC) 08/27/2022   New onset of congestive heart failure (HCC) 08/26/2022   Elevated transaminase level 08/26/2022   Drug-induced mood disorder (HCC) 05/18/2022   Post-operative state 09/04/2019   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   Tobacco use 06/09/2017   Breast cancer screening 01/21/2016   Chronic pain syndrome 09/25/2014   Type 2 diabetes mellitus with diabetic polyneuropathy, without long-term  current use of insulin (HCC) 06/30/2014   Peripheral neuropathy 05/09/2013   Poor dentition 02/01/2013   Pain in joint, ankle and foot 02/01/2013   Essential hypertension, benign 02/01/2013   FOOT PAIN, BILATERAL 06/16/2010   RECTAL BLEEDING 12/08/2009   HEMATURIA UNSPECIFIED 12/08/2009   Depression with anxiety 08/20/2009   Mixed hyperlipidemia 07/30/2009   ANEMIA-NOS 07/30/2009   GERD 07/30/2009   CALLUS, FOOT 07/30/2009      Onset and Duration of Symptoms: Patient reports that symptoms of increased memory difficulty started 7 or 8 years ago  and she notes that this was around the time of completely quitting drinking and may have just made her more aware of difficulties.  The patient reports that she does take medication such as gabapentin for her pain and mood control which may be contributing to her memory difficulties.   Progression of Symptoms: Patient reports that while she has continued to have memory difficulties that is been generally stable.   Additional Tests and Measures from other records:   Neuroimaging Results: Patient had an MRI brain with and without contrast performed on 05/05/2022.  This was due to reports of memory loss and memory difficulties.  MRI was interpreted by Despina Arias, MD.  The impression was of a normal MRI brain with and without contrast with no indication of acute process or previous stroke/hemorrhage etc.  There is no description of any significant T2 flair hyperintensities or indications of significant small vessel disease noted.  Tests Administered: Finger Tapping Test (FTT) Grooved Pegboard Wechsler Adult Intelligence Scale, 4th Edition (WAIS-IV) Wechsler Memory Scale, 4th Edition (WMS-IV); Adult Battery  Participation Level:   Active  Participation Quality:  Appropriate      Behavioral Observation:  The patient appeared well-groomed and appropriately dressed. Her manners were polite and appropriate to the situation. The patient's attitude  towards testing was positive and her effort was good.   Well Groomed, Alert, and Appropriate.   Test Results:   Initially, an estimation was made as to premorbid intellectual and cognitive functioning looking at educational and occupational history's as well as other psychosocial variables.  Patient noted history of stuttering and difficulties developing arithmetic academic achievement and attentional deficit with reports of hyperkinetic behaviors.  In young adulthood patient was diagnosed with bipolar affective disorder and these attentional deficits are likely related to that rather than attention deficit disorder proper.  Patient completed the seventh grade and dropped out in middle school due to psychosocial variables and family struggles.  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  We will utilize a conservative estimate of the patient performing roughly 1 standard deviation below normative population mean with composite scores likely following around 90 premorbidly.  Secondly, an estimation was made as to the validity of the current assessment.  Behavioral observations were consistent with the patient trying her hardest throughout and she persevered and stayed on task throughout.  Embedded validity checks indicated consistency throughout assessment and no indications of attempts to exaggerate or create false impressions of deficits beyond accurate ornaments.  This does appear to be a fair and valid assessment of the patient's current status.  FTT:  R (DH) Average= 20.2  Percentile Rank= 28th L (NDH) Average=27.6  Percentile Rank= 35th   As the patient has a history of significant cardiovascular disease and congestive heart failure with higher risk of stroke etc. the patient was administered the finger tapping Test and the grooved pegboard test to assess for possible lateralization of findings.  On the finger tapping Test, which assesses fine  motor speed with comparisons between primary hemisphere components motor functioning the patient produced consistent results between her right dominant hand and left dominant hand both of which were in the lower end of the average range.  Her left nondominant hand actually was somewhat better than her right dominant hand but both were not consistent with any severe deficits in this domain.  Grooved Pegboard:  R [facility]) time= 146s Percentile Rank=24th L (NDH) time= 204s  Percentile Rank=  12th    On the grooved pegboard test, which assesses fine motor control the patient did show some greater difficulties for her left nondominant hand with mild impairments.  There were no dropped keys during this assessment that the patient did show greater difficulty with fine motor control particularly with her left nondominant hand.  WAIS-IV:            Composite Score Summary          Scale Sum of Scaled Scores Composite Score Percentile Rank 95% Conf. Interval Qualitative Description  Verbal Comprehension 17 VCI 76 5 71-83 Borderline  Perceptual Reasoning 16 PRI 73 4 68-81 Borderline  Working Memory 12 WMI 77 6 72-85 Borderline  Processing Speed 11 PSI 76 5 70-87 Borderline  Full Scale 56 FSIQ 71 3 68-76 Borderline  General Ability 33 GAI 72 3 68-78 Borderline    In order to assess a wide range of cognitive domains and a well standardized well normed battery of measures the patient was administered the Wechsler Adult Intelligence Scale-IV.  As the patient is describing cognitive difficulties mostly related to attention and memory her current score should not be used as indicative of lifelong functioning but more of a description of her current status.  2 Global/composite scores were calculated in this battery.  The patient produced a full-scale IQ score of 71 which falls at the 3rd percentile in the borderline range relative to a normative population.  This is below predicted levels of premorbid  functioning although given the fact that the patient only completed school through the seventh grade but dropped out because of family variables rather than an inability to do the work accurate predictions of premorbid functioning are challenging.  We also calculated the patient's general abilities index score, which places less emphasis on measures more vulnerable to acute changes including auditory encoding and information processing speed.  Taking these variables out did not significantly change the patient's global abilities and she produced a general abilities index score of 72 which also fell at the 3rd percentile and in the borderline range relative to a normative population.  While these Global composite scores are below predicted levels of premorbid functioning there was significant consistency between all of the cognitive domains themselves.  The patient performed in the borderline range across the board and all composite domains calculated.  There was little to no variability in subtest performance reducing the likelihood of specific focal or localized changes and suggested that her current achievements are potentially more closely aligned with her lifelong functioning levels.          Verbal Comprehension Subtests Summary        Subtest Raw Score Scaled Score Percentile Rank Reference Group Scaled Score SEM  Similarities 16 6 9 5  1.08  Vocabulary 16 5 5 5  0.73  Information 7 6 9 6  0.67   The patient produced a verbal comprehension index score of 76 which falls at the 5th percentile and in the borderline range relative to a normative population.  There was little to no variability in subtest performance with all of them following consistent with global cognitive functioning measures.  The patient showed mild to moderate deficits relative to a normative populations on measures of verbal reasoning and problem-solving capacity, base vocabulary knowledge and general fund of information.  This would  be consistent with her vocational and occupational history.   Perceptual Reasoning Subtests Summary        Subtest Raw Score Scaled Score Percentile Rank  Reference Group Scaled Score SEM  Block Design 16 5 5 4  1.04  Matrix Reasoning 7 5 5 3  0.95  Visual Puzzles 7 6 9 5  0.99   The patient produced a perceptual reasoning index score of 73 which falls in the 4th percentile and in the borderline range relative to normative population.  Again, there was very little variability in subtest performance with the patient showing moderate deficits on measures of visual reasoning and problem-solving, visual-spatial and visual analysis abilities, and general visual constructional abilities.   Working Comptroller Raw Score Scaled Score Percentile Rank Reference Group Scaled Score SEM  Digit Span 21 7 16 6  0.85  Arithmetic 8 5 5 5  1.04   The patient produced a working memory index score of 77 with falls at the 6 percentile in the borderline range relative to a normative population.  The patient displayed mild/moderate weaknesses with regard to her primary auditory encoding capacity and more significant weaknesses on her ability to actively process information in her auditory Register.  This level of encoding weakness and even greater capacity to process information in her auditory Register would have a impact on her capacity to learn and remember new information particularly information that she would have to process and analyze actively.  Processing Speed Subtests Summary        Subtest Raw Score Scaled Score Percentile Rank Reference Group Scaled Score SEM  Symbol Search 18 6 9 5  1.31  Coding 34 5 5 4  0.99   The patient produced a processing speed index score of 76 which falls at the 5th percentile and in the borderline range relative to normative population.  Again, there was little variability in subtest performance and her overall performance was consistent with global  functioning.  The patient showed mild to moderate weaknesses with regard to visual scanning, visual searching and overall speed of mental operations (focus execute capacity).   WMS-IV:            Index Score Summary        Index Sum of Scaled Scores Index Score Percentile Rank 95% Confidence Interval Qualitative Descriptor  Auditory Memory (AMI) 30 85 16 80-92 Low Average  Visual Memory (VMI) 24 76 5 71-83 Borderline  Visual Working Memory (VWMI) 5 52 0.1 48-63 Extremely Low  Immediate Memory (IMI) 29 81 10 76-88 Low Average  Delayed Memory (DMI) 25 74 4 69-82 Borderline    In order to objectively assess multiple components of attention and memory/learning the patient was administered the Wechsler Memory Scale-IV.  On the Wechsler Adult Intelligence Scale the patient displayed mild deficits with regard to primary auditory encoding with greater deficits with regard to her ability to actively process information in her auditory Register.  The patient showed even greater weaknesses with regard to visual encoding where she was significantly impaired producing a working memory index score of 52 falling below the 1st percentile relative to a normative population.  This level of visual encoding capacity would have a significant deleterious impact on her ability to store and organize new visual information.  Breaking memory functions down between auditory versus visual memory the patient actually had her best performance in any cognitive domain related to auditory memory capacity.  Her performance was generally consistent with predictions of premorbid functioning levels and did not suggest any significant loss in this area.  The patient had greater weakness with regard to visual memory as would be predicted by  her significant deficits and weaknesses with regard to visual encoding capacity.  The patient was able to store and organize some degree of visual information but it was still problematic for her.   In any event, there does not appear to be significant difference with regard to memory outside of those that would be explained by significant encoding deficits for visual memory.  Breaking memory components down between immediate versus delayed the patient produced an immediate memory index score of 81 which fell at the 10th percentile and in the low average range.  This is generally accounted for by her relatively better auditory memory versus other cognitive domains.  The patient did have somewhat greater loss of initially stored information than would be expected and produced a delayed memory index score of 74 which fell at the 4th percentile.  The patient showed some improvement in memory under recall/recognition measures suggesting potential for greater storage of information than would be seen in real-world settings without such cueing.  The patient performed very consistent with premorbid estimates on measures of story learning as well as clinical paired Associates learning.  She had much greater difficulty with regard to various visual memory components.  The patient's greatest individual deficits related to attention/memory had to do with visual encoding capacity where she did very poorly both with primary encoding as well as her capacity to actively process information in her visual Register.         Primary Subtest Scaled Score Summary       Subtest Domain Raw Score Scaled Score Percentile Rank  Logical Memory I AM 21 8 25   Logical Memory II AM 16 8 25   Verbal Paired Associates I AM 22 8 25   Verbal Paired Associates II AM 5 6 9   Designs I VM 46 6 9  Designs II VM 30 4 2   Visual Reproduction I VM 27 7 16   Visual Reproduction II VM 11 7 16   Spatial Addition VWM 2 3 1   Symbol Span VWM 3 2 0.4            Auditory Memory Process Score Summary      Process Score Raw Score Scaled Score Percentile Rank Cumulative Percentage (Base Rate)  LM II Recognition 23 - - 26-50%  VPA II Recognition  36 - - 17-25%         Visual Memory Process Score Summary      Process Score Raw Score Scaled Score Percentile Rank Cumulative Percentage (Base Rate)  DE I Content 25 5 5  -  DE I Spatial 13 8 25  -  DE II Content 22 5 5  -  DE II Spatial 8 7 16  -  DE II Recognition 12 - - 17-25%  VR II Recognition 1 - - <=2%    ABILITY-MEMORY ANALYSIS   Ability Score:    VCI: 76 Date of Testing:           WAIS-IV; WMS-IV 2022/12/29             Predicted Difference Method    Index Predicted WMS-IV Index Score Actual WMS-IV Index Score Difference Critical Value   Significant Difference Y/N Base Rate  Auditory Memory 88 85 3 9.00 N    Visual Memory 89 76 13 8.38 Y 15-20%  Visual Working Memory 87 52 35 10.86 Y <1%  Immediate Memory 86 81 5 10.12 N    Delayed Memory 88 74 14 9.95 Y 15%    Summary of Results:   Overall, the results  of the current neuropsychological evaluation and the objective assessment of a broad range of cognitive domain suggest some global dampening of overall cognitive capacity with particular weaknesses with regard to visual encoding capacity having a direct impact on visual memory and learning.  The patient is best performing area relative to her on global performance had to do with auditory memory and learning.  The patient showed consistent performance on typical measures that are less sensitive to change with acute neurological involvement and overall her general performance is only slightly below predicted levels of premorbid functioning.  The patient is clearly having greater deficits with regard to visual encoding.  The patient's memory while weak does improve under recognition/cued types of challenge.  There does not appear to be any pattern outside of global reduction overall in capacity and specific changes in visual working memory per se.  Impression/Diagnosis:   Overall, the results of the current neuropsychological evaluation taking into account the patient's  subjective reports, available medical information and objective neuropsychological testing are consistent with 1 another.  The patient does have cognitive weaknesses relative to normative comparisons but she had a limited educational experience and limited occupational history.  Psychosocial variables and likely relatively poorly managed bipolar affective disorder have been playing a role.  The patient shows specific weaknesses that are likely to be relatively new/recent developments around visual encoding capacity but even with the patient's complaints of memory difficulties her best area of functioning overall had to do with auditory memory and learning abilities.  While the patient does not have descriptions of significant small vessel disease/microvascular ischemic changes she does have significant risk factors for such components.  Type 2 diabetes, polyneuropathy, and congestive heart failure are all present.  Bipolar disorder when not well-managed can also have a significant negative impact on attention and concentration during certain mood shifts.  From a diagnostic standpoint, the patient does not present with a pattern consistent with progressive neurodegenerative types of process and I suspect the combination of her poor health in particular vascular issues and congestive heart failure, history of significant alcohol abuse (patient quit drinking in 2013) and poor sleep with anxiety and depressive symptomatology and chronic pain are likely all playing a role.  The patient does have clear cognitive weaknesses but they are generally consistent across various domains with the patient's best cognitive performance relatively speaking having to do with auditory memory and her weakest areas having to do with visual encoding capacity.  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  I suspect that the changes she has been describing over many  years have to do with a combination of the variables addressed above.  I will sit down with the patient go over the results of the current neuropsychological evaluation and make specific recommendations for her to begin addressing outside of medical interventions.  Given the patient's recent acute worsening of her congestive heart failure this clearly will be one of the primary focus in her overall health care and significant risk of stroke that is now present.  Diagnosis:    Memory loss  Bipolar disorder, in partial remission, most recent episode manic (HCC)  Chronic pain syndrome   _____________________ Arley Phenix, Psy.D. Clinical Neuropsychologist

## 2023-06-09 ENCOUNTER — Other Ambulatory Visit: Payer: Self-pay | Admitting: Nurse Practitioner

## 2023-06-09 DIAGNOSIS — G894 Chronic pain syndrome: Secondary | ICD-10-CM

## 2023-06-09 DIAGNOSIS — F419 Anxiety disorder, unspecified: Secondary | ICD-10-CM

## 2023-06-09 NOTE — Telephone Encounter (Signed)
 Last Fill: 03/15/22  Last OV: 05/09/23 Next OV: None Scheduled  Routing to provider for review/authorization.

## 2023-06-09 NOTE — Telephone Encounter (Signed)
 Copied from CRM [id number]. Topic: Clinical - Medication Refill >> Jun 09, 2023 11:13 AM Dollene Primrose wrote: Most Recent Primary Care Visit:  Provider: Hoy Register  Department: [facility]  Visit Type: MYCHART VIDEO VISIT  Date: 05/09/2023  Medication: gabapentin (NEURONTIN) 300 MG capsule  Has the patient contacted their pharmacy? no (Agent: If no, request that the patient contact the pharmacy for the refill. If patient does not wish to contact the pharmacy document the reason why and proceed with request.) (Agent: If yes, when and what did the pharmacy advise?)  Is this the correct pharmacy for this prescription? yes If no, delete pharmacy and type the correct one.  This is the patient's preferred pharmacy:  [facility] [address], [address] [place] [address] Phone: [phone] Fax: [phone]   Has the prescription been filled recently? no  Is the patient out of the medication? yes  Has the patient been seen for an appointment in the last year OR does the patient have an upcoming appointment? yes  Can we respond through MyChart? no  Agent: Please be advised that Rx refills may take up to 3 business days. We ask that you follow-up with your pharmacy.

## 2023-06-13 ENCOUNTER — Other Ambulatory Visit (HOSPITAL_COMMUNITY): Payer: Self-pay

## 2023-06-13 ENCOUNTER — Other Ambulatory Visit: Payer: Self-pay

## 2023-06-13 ENCOUNTER — Other Ambulatory Visit: Payer: Self-pay | Admitting: Nurse Practitioner

## 2023-06-13 ENCOUNTER — Telehealth: Payer: Self-pay

## 2023-06-13 DIAGNOSIS — F419 Anxiety disorder, unspecified: Secondary | ICD-10-CM

## 2023-06-13 DIAGNOSIS — G894 Chronic pain syndrome: Secondary | ICD-10-CM

## 2023-06-13 MED ORDER — GABAPENTIN 300 MG PO CAPS
300.0000 mg | ORAL_CAPSULE | Freq: Two times a day (BID) | ORAL | 3 refills | Status: DC
  Filled 2023-06-13: qty 180, 90d supply, fill #0

## 2023-06-13 NOTE — Telephone Encounter (Signed)
 Patient called and she states that she went to [facility] and got her medication list printed.    Copied from CRM [id number]. Topic: General - Other >> Jun 09, 2023 11:12 AM Herbert Seta B wrote: Reason for CRM: Patient calling because needs past year list of medications prescribed printed out and will pick up and pay fee. Also wants to advise NP that order for pads and gloves will be coming in soon.  [phone]

## 2023-06-21 ENCOUNTER — Telehealth: Payer: Self-pay | Admitting: Nurse Practitioner

## 2023-06-21 NOTE — Telephone Encounter (Signed)
 A document form has been faxed: Incontinence Order, to be filled out by provider. Send document back via Fax within 5-days. Document is located in providers tray at front office.           Fax number:  [phone]

## 2023-06-23 ENCOUNTER — Other Ambulatory Visit (HOSPITAL_COMMUNITY): Payer: Self-pay | Admitting: Cardiology

## 2023-06-23 ENCOUNTER — Other Ambulatory Visit (HOSPITAL_COMMUNITY): Payer: Self-pay

## 2023-06-23 ENCOUNTER — Ambulatory Visit (INDEPENDENT_AMBULATORY_CARE_PROVIDER_SITE_OTHER): Payer: Medicaid Other

## 2023-06-23 ENCOUNTER — Other Ambulatory Visit: Payer: Self-pay

## 2023-06-23 DIAGNOSIS — I255 Ischemic cardiomyopathy: Secondary | ICD-10-CM

## 2023-06-23 LAB — CUP PACEART REMOTE DEVICE CHECK
Battery Remaining Longevity: 136 mo
Battery Voltage: 3.08 V
Brady Statistic RV Percent Paced: 7.79 %
Date Time Interrogation Session: 20250314015628
HighPow Impedance: 63 Ohm
Implantable Lead Connection Status: 753985
Implantable Lead Connection Status: 753985
Implantable Lead Connection Status: 753985
Implantable Lead Implant Date: 20241212
Implantable Lead Implant Date: 20241212
Implantable Lead Implant Date: 20241212
Implantable Lead Location: 753858
Implantable Lead Location: 753859
Implantable Lead Location: 753860
Implantable Lead Model: 4598
Implantable Lead Model: 5076
Implantable Pulse Generator Implant Date: 20241212
Lead Channel Impedance Value: 399 Ohm
Lead Channel Impedance Value: 437 Ohm
Lead Channel Impedance Value: 437 Ohm
Lead Channel Impedance Value: 456 Ohm
Lead Channel Impedance Value: 475 Ohm
Lead Channel Impedance Value: 494 Ohm
Lead Channel Impedance Value: 494 Ohm
Lead Channel Impedance Value: 646 Ohm
Lead Channel Impedance Value: 836 Ohm
Lead Channel Impedance Value: 855 Ohm
Lead Channel Impedance Value: 855 Ohm
Lead Channel Impedance Value: 855 Ohm
Lead Channel Impedance Value: 874 Ohm
Lead Channel Pacing Threshold Amplitude: 0.5 V
Lead Channel Pacing Threshold Amplitude: 0.5 V
Lead Channel Pacing Threshold Amplitude: 0.625 V
Lead Channel Pacing Threshold Pulse Width: 0.4 ms
Lead Channel Pacing Threshold Pulse Width: 0.4 ms
Lead Channel Pacing Threshold Pulse Width: 0.4 ms
Lead Channel Sensing Intrinsic Amplitude: 3.6 mV
Lead Channel Sensing Intrinsic Amplitude: 9.9 mV
Lead Channel Setting Pacing Amplitude: 1.25 V
Lead Channel Setting Pacing Amplitude: 1.5 V
Lead Channel Setting Pacing Amplitude: 2 V
Lead Channel Setting Pacing Pulse Width: 0.4 ms
Lead Channel Setting Pacing Pulse Width: 0.4 ms
Lead Channel Setting Sensing Sensitivity: 0.3 mV
Zone Setting Status: 755011
Zone Setting Status: 755011
Zone Setting Status: 755011

## 2023-06-23 MED ORDER — APIXABAN 5 MG PO TABS
5.0000 mg | ORAL_TABLET | Freq: Two times a day (BID) | ORAL | 6 refills | Status: DC
  Filled 2023-06-23: qty 60, 30d supply, fill #0
  Filled 2023-07-31: qty 60, 30d supply, fill #1
  Filled 2023-09-01: qty 60, 30d supply, fill #2
  Filled 2023-10-06: qty 60, 30d supply, fill #3
  Filled 2023-11-08: qty 60, 30d supply, fill #4
  Filled 2023-12-22 (×2): qty 60, 30d supply, fill #5
  Filled 2024-01-25: qty 60, 30d supply, fill #6

## 2023-06-23 NOTE — Telephone Encounter (Signed)
 Patient needs video visit with updated information for forms to be completed. Unable to reach patient by phone to make appointment.

## 2023-06-24 ENCOUNTER — Other Ambulatory Visit (HOSPITAL_COMMUNITY): Payer: Self-pay

## 2023-06-25 NOTE — Progress Notes (Unsigned)
  Cardiology Office Note:  .   Date:  06/25/2023  ID:  Kendra Griffith, DOB 05/06/1965, MRN [id number] PCP: Claiborne Rigg, NP  [facility] Providers Cardiologist:  Dr. Shirlee Latch { EP: Dr. Elberta Fortis  History of Present Illness: .   Kendra Griffith is a 58 y.o. female w/PMHx of  HTN, DM, smoker, remote ETOH Sickle cell trait Anxiety/bipolar d/o DVT (1980, May 2024, neg hypercoag w/u) Chronic CHF (BiVe, systolic), NICM, LBBB ICD  Referred to EP for consideration of device for his hx of NICM, persistent reduction in LVEF despite max tol GDMT, saw Dr. Elberta Fortis 02/01/23, planned for CRT-D  Implanted 03/23/23  Today's visit is scheduled as her 91 day post implant visit  ROS:   *** chronic outputs *** volume *** BP% *** eliquis, dose, bleeding, hx DVT *** f/u with AHF, f/u echo per them  Device information MDT CRT-D ([place]) 03/23/23 Primary prevention  Studies Reviewed: Marland Kitchen    EKG not done today  DEVICE interrogation done today and reviewed by myself *** Battery and lead measurements are good ***   echo in 5/24 showing EF < 20%, grade III DD, RV moderately reduced, RVSP 58 mmHg, mild MR, moderate TR.  - R/LHC (5/24): normal coronaries; RA 6, PA 47/24 (33), PCWP 23, CO/CI (Fick) 3.4/2.1, PVR 2.6 WU.  - Cardiac MRI (8/24): LVEF 25%, RVEF 36%, no delayed enhancement.   - Echo (9/24): EF 25% with septal-lateral dyssynchrony, mild RV dysfunction, and IVC normal.    Risk Assessment/Calculations:    Physical Exam:   VS:  LMP 03/12/2019 (Approximate)    Wt Readings from Last 3 Encounters:  04/13/23 165 lb (74.8 kg)  03/23/23 165 lb (74.8 kg)  02/24/23 156 lb 8.4 oz (71 kg)    GEN: Well nourished, well developed in no acute distress NECK: No JVD; No carotid bruits CARDIAC: ***RRR, no murmurs, rubs, gallops RESPIRATORY:  *** CTA b/l without rales, wheezing or rhonchi  ABDOMEN: Soft, non-tender, non-distended EXTREMITIES:  No edema; No deformity   ICD site: *** is  stable, no thinning, fluctuation, tethering  ASSESSMENT AND PLAN: .    CRT-D *** intact function *** no programming changes made  NICM Chronic CHF (BiVe failure) *** C/w Dr. Wynonia Musty  DVT hx (recurrent) On Eliquis     {Are you ordering a CV Procedure (e.g. stress test, cath, DCCV, TEE, etc)?   Press F2        :[id number]}     Dispo: ***  Signed, Sheilah Pigeon, PA-C

## 2023-06-26 ENCOUNTER — Other Ambulatory Visit: Payer: Self-pay

## 2023-06-26 ENCOUNTER — Encounter: Payer: Self-pay | Admitting: Physician Assistant

## 2023-06-26 ENCOUNTER — Ambulatory Visit: Payer: Medicaid Other | Attending: Physician Assistant | Admitting: Physician Assistant

## 2023-06-26 VITALS — BP 100/64 | HR 88 | Ht 65.0 in | Wt 165.8 lb

## 2023-06-26 DIAGNOSIS — Z95810 Presence of automatic (implantable) cardiac defibrillator: Secondary | ICD-10-CM

## 2023-06-26 DIAGNOSIS — I5022 Chronic systolic (congestive) heart failure: Secondary | ICD-10-CM | POA: Diagnosis not present

## 2023-06-26 DIAGNOSIS — I428 Other cardiomyopathies: Secondary | ICD-10-CM | POA: Diagnosis not present

## 2023-06-26 LAB — CUP PACEART INCLINIC DEVICE CHECK
Date Time Interrogation Session: 20250317100858
Implantable Lead Connection Status: 753985
Implantable Lead Connection Status: 753985
Implantable Lead Connection Status: 753985
Implantable Lead Implant Date: 20241212
Implantable Lead Implant Date: 20241212
Implantable Lead Implant Date: 20241212
Implantable Lead Location: 753858
Implantable Lead Location: 753859
Implantable Lead Location: 753860
Implantable Lead Model: 4598
Implantable Lead Model: 5076
Implantable Pulse Generator Implant Date: 20241212

## 2023-06-26 NOTE — Patient Instructions (Signed)
 Medication Instructions:   Your physician recommends that you continue on your current medications as directed. Please refer to the Current Medication list given to you today.   *If you need a refill on your cardiac medications before your next appointment, please call your pharmacy*   Lab Work: NONE ORDERED  TODAY    If you have labs (blood work) drawn today and your tests are completely normal, you will receive your results only by: MyChart Message (if you have MyChart) OR A paper copy in the mail If you have any lab test that is abnormal or we need to change your treatment, we will call you to review the results.   Testing/Procedures: NONE ORDERED  TODAY    Follow-Up: At [facility], you and your health needs are our priority.  As part of our continuing mission to provide you with exceptional heart care, we have created designated Provider Care Teams.  These Care Teams include your primary Cardiologist (physician) and Advanced Practice Providers (APPs -  Physician Assistants and Nurse Practitioners) who all work together to provide you with the care you need, when you need it.  We recommend signing up for the patient portal called "MyChart".  Sign up information is provided on this After Visit Summary.  MyChart is used to connect with patients for Virtual Visits (Telemedicine).  Patients are able to view lab/test results, encounter notes, upcoming appointments, etc.  Non-urgent messages can be sent to your provider as well.   To learn more about what you can do with MyChart, go to [email or web address].    Your next appointment:  WITH DR Shirlee Latch ( RECALL MARCH 2025  )  1 year(s)     Provider:    Loman Brooklyn, MD or Francis Dowse, PA-C    Other Instructions

## 2023-07-19 NOTE — Telephone Encounter (Signed)
 Blake from [employer] called to check status. Advised that the paperwork was received, however, pt needs an appointment. She will reach out to pt but so far, they too have been unsuccessful at contacting the pt.

## 2023-07-25 ENCOUNTER — Telehealth: Payer: Self-pay

## 2023-07-25 NOTE — Telephone Encounter (Signed)
 Reminded patient that we are still holding her rings that she left at your January OV.  They are sealed at my desk.  Please ask the device clinic for "mandy" and I will bring them to you at front desk.  Patient to come by in next week when I am scheduled to be here.

## 2023-07-26 NOTE — Progress Notes (Signed)
 Remote ICD transmission.

## 2023-07-31 ENCOUNTER — Other Ambulatory Visit (HOSPITAL_COMMUNITY): Payer: Self-pay

## 2023-07-31 ENCOUNTER — Telehealth (HOSPITAL_COMMUNITY): Payer: Self-pay | Admitting: Cardiology

## 2023-07-31 ENCOUNTER — Other Ambulatory Visit (HOSPITAL_COMMUNITY): Payer: Self-pay | Admitting: Cardiology

## 2023-07-31 ENCOUNTER — Other Ambulatory Visit: Payer: Self-pay

## 2023-07-31 NOTE — Telephone Encounter (Signed)
 Called to confirm/remind patient of their appointment at the [facility] on 07/31/2023.   Appointment:   [x] Confirmed  [] Left mess   [] No answer/No voice mail  [] VM Full/unable to leave message  [] Phone not in service  Patient reminded to bring all medications and/or complete list.  Confirmed patient has transportation. Gave directions, instructed to utilize valet parking.

## 2023-08-01 ENCOUNTER — Other Ambulatory Visit (HOSPITAL_COMMUNITY): Payer: Self-pay

## 2023-08-01 ENCOUNTER — Other Ambulatory Visit: Payer: Self-pay

## 2023-08-01 ENCOUNTER — Encounter (HOSPITAL_COMMUNITY): Payer: Self-pay | Admitting: Cardiology

## 2023-08-01 ENCOUNTER — Encounter (HOSPITAL_COMMUNITY): Payer: Self-pay

## 2023-08-01 ENCOUNTER — Ambulatory Visit (HOSPITAL_COMMUNITY)
Admission: RE | Admit: 2023-08-01 | Discharge: 2023-08-01 | Disposition: A | Discharge status: Home / Self Care | Source: Ambulatory Visit | Attending: Cardiology | Admitting: Cardiology

## 2023-08-01 VITALS — BP 102/60 | HR 77 | Wt 160.0 lb

## 2023-08-01 DIAGNOSIS — I959 Hypotension, unspecified: Secondary | ICD-10-CM | POA: Insufficient documentation

## 2023-08-01 DIAGNOSIS — Z4509 Encounter for adjustment and management of other cardiac device: Secondary | ICD-10-CM | POA: Insufficient documentation

## 2023-08-01 DIAGNOSIS — Z95810 Presence of automatic (implantable) cardiac defibrillator: Secondary | ICD-10-CM | POA: Insufficient documentation

## 2023-08-01 DIAGNOSIS — I428 Other cardiomyopathies: Secondary | ICD-10-CM | POA: Insufficient documentation

## 2023-08-01 DIAGNOSIS — Z7984 Long term (current) use of oral hypoglycemic drugs: Secondary | ICD-10-CM | POA: Diagnosis not present

## 2023-08-01 DIAGNOSIS — E119 Type 2 diabetes mellitus without complications: Secondary | ICD-10-CM | POA: Diagnosis not present

## 2023-08-01 DIAGNOSIS — I110 Hypertensive heart disease with heart failure: Secondary | ICD-10-CM | POA: Diagnosis not present

## 2023-08-01 DIAGNOSIS — Z7901 Long term (current) use of anticoagulants: Secondary | ICD-10-CM | POA: Diagnosis not present

## 2023-08-01 DIAGNOSIS — Z5986 Financial insecurity: Secondary | ICD-10-CM | POA: Diagnosis not present

## 2023-08-01 DIAGNOSIS — Z86718 Personal history of other venous thrombosis and embolism: Secondary | ICD-10-CM | POA: Diagnosis not present

## 2023-08-01 DIAGNOSIS — Z79899 Other long term (current) drug therapy: Secondary | ICD-10-CM | POA: Diagnosis not present

## 2023-08-01 DIAGNOSIS — I5022 Chronic systolic (congestive) heart failure: Secondary | ICD-10-CM | POA: Insufficient documentation

## 2023-08-01 DIAGNOSIS — F17210 Nicotine dependence, cigarettes, uncomplicated: Secondary | ICD-10-CM | POA: Insufficient documentation

## 2023-08-01 LAB — BASIC METABOLIC PANEL WITH GFR
Anion gap: 10 (ref 5–15)
BUN: 13 mg/dL (ref 6–20)
CO2: 28 mmol/L (ref 22–32)
Calcium: 9.9 mg/dL (ref 8.9–10.3)
Chloride: 100 mmol/L (ref 98–111)
Creatinine, Ser: 1.11 mg/dL — ABNORMAL HIGH (ref 0.44–1.00)
GFR, Estimated: 58 mL/min — ABNORMAL LOW (ref >60)
Glucose, Bld: 111 mg/dL — ABNORMAL HIGH (ref 70–99)
Potassium: 4.1 mmol/L (ref 3.5–5.1)
Sodium: 138 mmol/L (ref 135–145)

## 2023-08-01 LAB — CBC
HCT: 44.4 % (ref 36.0–46.0)
Hemoglobin: 13.6 g/dL (ref 12.0–15.0)
MCH: 27.2 pg (ref 26.0–34.0)
MCHC: 30.6 g/dL (ref 30.0–36.0)
MCV: 88.8 fL (ref 80.0–100.0)
Platelets: 117 10*3/uL — ABNORMAL LOW (ref 150–400)
RBC: 5 MIL/uL (ref 3.87–5.11)
RDW: 14.4 % (ref 11.5–15.5)
WBC: 7.1 10*3/uL (ref 4.0–10.5)
nRBC: 0 % (ref 0.0–0.2)

## 2023-08-01 LAB — BRAIN NATRIURETIC PEPTIDE: B Natriuretic Peptide: 12.4 pg/mL (ref 0.0–100.0)

## 2023-08-01 MED ORDER — SPIRONOLACTONE 25 MG PO TABS
25.0000 mg | ORAL_TABLET | Freq: Every day | ORAL | 3 refills | Status: AC
  Filled 2023-08-01: qty 90, 90d supply, fill #0
  Filled 2023-11-08: qty 90, 90d supply, fill #1
  Filled 2024-03-04: qty 90, 90d supply, fill #2

## 2023-08-01 MED ORDER — BISOPROLOL FUMARATE 5 MG PO TABS
2.5000 mg | ORAL_TABLET | Freq: Every day | ORAL | 11 refills | Status: AC
  Filled 2023-08-01 – 2023-08-02 (×4): qty 15, 30d supply, fill #0
  Filled 2023-08-14 (×2): qty 15, 30d supply, fill #1
  Filled 2023-09-01: qty 15, 30d supply, fill #2
  Filled 2023-11-08: qty 15, 30d supply, fill #3
  Filled 2023-12-22 (×2): qty 15, 30d supply, fill #4
  Filled 2024-01-25: qty 15, 30d supply, fill #5
  Filled 2024-03-04: qty 15, 30d supply, fill #6
  Filled 2024-04-08: qty 15, 30d supply, fill #7
  Filled 2024-05-13 (×2): qty 15, 30d supply, fill #8

## 2023-08-01 NOTE — Progress Notes (Addendum)
 [facility] Note    PCP: Collins Dean, NP HF Cardiologist: Dr. Mitzie Anda  Chief complaint: CHF  58 y.o. AAF with past medical history of HTN, DM, tobacco use, anxiety, bipolar disorder, a remote history of ETOH abuse, and diagnosis of systolic heart failure and LE DVT in 5/24.   Admitted 5/24 with new acute systolic heart failure. Echo showed EF <20%, GIIIDD, RV mod reduced, mod elevated PASP ~58, LA mod dilated, RA mildly dilated, mild MR, mod TR. Hs trops negative. She was diuresed w/ IV Lasix  and started on GDMT. Heparin  started for LE DVT. V/Q scan showed no perfusion defects to suggest PE. Hypercoagulable workup was negative. R/LHC showed normal coronary arteries, elevated filling pressures with low cardiac output (RA 6, PA 47/24/33, PA sat 57%, Fick CI 2.1). She was diuresed further w/ IV Lasix  and transitioned to PO torsemide . GDMT further titrated. Switched to Eliquis  after completion of cath.   Cardiac MRI in 8/24 showed LV EF 25%, RV EF 36%, no delayed enhancement.    Echo 9/24 EF 25% with septal-lateral dyssynchrony, mild RV dysfunction, and IVC normal.   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   Medtronic CRT-D device placed in 12/24.   Today she returns for HF follow up. She feels like breathing is somewhat improved post-CRT.  Generally no exertional dyspnea.  No problems getting around the grocery store.  Occasional lightheadedness if she stands up fast.  No chest pain.  No orthopnea/PND. Still smokes occasionally but has mostly quit.  Medtronic device interrogation: Stable thoracic impedance, >95% BiV pacing, no AF/VT.   ECG (personally reviewed): NSR with BiV pacing  Labs (7/24): K 4.6, creatinine 1.07 => 1.09, BNP 77 Labs (9/24): K 4.0, creatinine 1.10, BNP 50   Labs (11/24): K 4.1, creatinine 1.01  PMH: 1. DVT: DVT in 1980.  Left lower leg DVT in 5/24.  - Prothrombin gene mutation negative,  factor V Leiden negative, lupus anticoagulant negative 2. Type 2 diabetes 3. GERD 4. HTN 5. Sickle cell trait 6. Active smoker 7. Prior ETOH abuse.  8. Bipolar disorder 9. Chronic systolic CHF: Nonischemic cardiomyopathy.  MDT CRT-D device.  Diagnosis by echo in 5/24 showing EF < 20%, grade III DD, RV moderately reduced, RVSP 58 mmHg, mild MR, moderate TR.  - R/LHC (5/24): normal coronaries; RA 6, PA 47/24 (33), PCWP 23, CO/CI (Fick) 3.4/2.1, PVR 2.6 WU.  - Cardiac MRI (8/24): LVEF 25%, RVEF 36%, no delayed enhancement.   - Echo (9/24): EF 25% with septal-lateral dyssynchrony, mild RV dysfunction, and IVC normal.  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   FH: Mother with CHF, ? History of rheumatic heart disease.  Sister with CHF.   Current Outpatient Medications  Medication Sig Dispense Refill   acetaminophen -codeine  (TYLENOL  #3) 300-30 MG tablet Take 1-2 tablets by mouth every 4 (four) hours as needed for moderate pain (pain score 4-6). 60 tablet 0   albuterol  (VENTOLIN  HFA) 108 (90 Base) MCG/ACT inhaler Inhale 2 puffs into the lungs every 6 (six) hours as needed for wheezing or shortness of breath. 18 g 0   amitriptyline  (ELAVIL ) 75 MG tablet Take 1 tablet (75 mg total) by mouth at bedtime for pain and depression 90 tablet 1   apixaban  (ELIQUIS ) 5 MG TABS tablet Take 1 tablet (5 mg total) by mouth 2 (two) times daily. 60 tablet 6   BIOTIN 5000 PO Take  1 tablet by mouth in the morning, at noon, and at bedtime.     bisoprolol  (ZEBETA ) 5 MG tablet Take 0.5 tablets (2.5 mg total) by mouth daily. 15 tablet 11   Blood Pressure Monitor DEVI Please provide patient with insurance approved blood pressure monitor 1 each 0   cholecalciferol (VITAMIN D3) 25 MCG (1000 UT) tablet Take 1,000 Units by mouth daily.     dapagliflozin  propanediol (FARXIGA ) 10 MG TABS tablet Take 1 tablet (10 mg total) by mouth daily. NEEDS FOLLOW UP APPOINTMENT FOR MORE  REFILLS 30 tablet 1   famotidine  (PEPCID ) 20 MG tablet Take 1 tablet (20 mg total) by mouth 2 (two) times daily for heartburn 180 tablet 1   gabapentin  (NEURONTIN ) 300 MG capsule Take 1 capsule (300 mg total) by mouth 2 (two) times daily. 180 capsule 3   hydrOXYzine  (ATARAX ) 50 MG tablet Take 1 tablet (50 mg total) by mouth 3 (three) times daily as needed. Please deliver 90 tablet 2   losartan  (COZAAR ) 25 MG tablet Take 0.5 tablets (12.5 mg total) by mouth at bedtime. 45 tablet 3   Menthol, Topical Analgesic, (BIOFREEZE EX) Apply 1 spray topically daily as needed (foot pain). Menthol     Misc. Devices MISC Please provide patient with incontinence pads or disposable underwear or choice. N39.3 1 each PRN   Multiple Vitamins-Minerals (MULTIVITAMIN WITH MINERALS) tablet Take 1 tablet by mouth daily.     omeprazole  (PRILOSEC) 20 MG capsule Take 1 capsule (20 mg total) by mouth daily for heartburn 90 capsule 1   potassium chloride  SA (KLOR-CON  M) 20 MEQ tablet Take 20 mEq by mouth daily.     rosuvastatin  (CRESTOR ) 5 MG tablet Take 1 tablet (5 mg total) by mouth daily for cholesterol 90 tablet 0   tetrahydrozoline 0.05 % ophthalmic solution Place 1 drop into both eyes daily as needed (itching /red eyes).     torsemide  (DEMADEX ) 20 MG tablet Take 2 tablets (40 mg total) by mouth daily. 60 tablet 6   trolamine salicylate (ASPERCREME) 10 % cream Apply 1 Application topically daily as needed (Foot pain). With lidocaine      urea (CARMOL) 40 % CREA Apply 1 application  topically daily as needed (Foot pain).     vitamin B-12 (CYANOCOBALAMIN ) 500 MCG tablet Take 500 mcg by mouth daily.     nicotine  (NICODERM CQ  - DOSED IN MG/24 HR) 7 mg/24hr patch Place 1 patch (7 mg total) onto the skin daily. (Patient not taking: Reported on 06/26/2023) 14 patch 0   spironolactone  (ALDACTONE ) 25 MG tablet Take 1 tablet (25 mg total) by mouth daily. 90 tablet 3   No current facility-administered medications for this encounter.    Allergies  Allergen Reactions   Amoxicillin     Pain Did it involve swelling of the face/tongue/throat, SOB, or low BP? No Did it involve sudden or severe rash/hives, skin peeling, or any reaction on the inside of your mouth or nose? No Did you need to seek medical attention at a hospital or doctor's office? Yes When did it last happen?      5 years ago If all above answers are "NO", may proceed with cephalosporin use.    Darvocet [Propoxyphene N-Acetaminophen ] Nausea And Vomiting    Makes her jittery   Hydrocodone-Acetaminophen  Nausea And Vomiting    Upset stomach   Valium [Diazepam] Other (See Comments)    Makes pt. Feel "out of wack"   Social History   Socioeconomic History   Marital  status: Single    Spouse name: Not on file   Number of children: 3   Years of education: 8-9   Highest education level: 8th grade  Occupational History   Occupation: applying for disability  Tobacco Use   Smoking status: Every Day    Current packs/day: 2.00    Average packs/day: 2.0 packs/day for 29.0 years (58.0 ttl pk-yrs)    Types: Cigarettes   Smokeless tobacco: Never  Vaping Use   Vaping status: Never Used  Substance and Sexual Activity   Alcohol  use: No    Alcohol /week: 0.0 standard drinks of alcohol     Comment: Stopped drinking 2013   Drug use: No   Sexual activity: Not Currently    Birth control/protection: None  Other Topics Concern   Not on file  Social History Narrative   Patient lives alone in an apartment on the first floor.  3 children.  Applying for disability.  Education: 8th or 9th grade.   Currently not working   Trying to go back to school online for [employer]   Six Grandchildren   Social Drivers of Health   Financial Resource Strain: Medium Risk (09/02/2022)   Overall Financial Resource Strain (CARDIA)    Difficulty of Paying Living Expenses: Somewhat hard  Food Insecurity: Food Insecurity Present (10/26/2022)   Hunger Vital Sign    Worried About Running Out of  Food in the Last Year: Sometimes true    Ran Out of Food in the Last Year: Sometimes true  Transportation Needs: Unmet Transportation Needs (11/25/2022)   PRAPARE - Administrator, Civil Service (Medical): No    Lack of Transportation (Non-Medical): Yes  Physical Activity: Insufficiently Active (09/02/2022)   Exercise Vital Sign    Days of Exercise per Week: 1 day    Minutes of Exercise per Session: 30 min  Stress: Stress Concern Present (09/02/2022)   [employer] of Occupational Health - Occupational Stress Questionnaire    Feeling of Stress : To some extent  Social Connections: Moderately Isolated (09/02/2022)   Social Connection and Isolation Panel [NHANES]    Frequency of Communication with Friends and Family: Three times a week    Frequency of Social Gatherings with Friends and Family: Once a week    Attends Religious Services: Never    Database administrator or Organizations: Yes    Attends Banker Meetings: 1 to 4 times per year    Marital Status: Divorced  Intimate Partner Violence: Unknown (06/08/2017)   Humiliation, Afraid, Rape, and Kick questionnaire    Fear of Current or Ex-Partner: Patient declined    Emotionally Abused: Patient declined    Physically Abused: Patient declined    Sexually Abused: Patient declined   Wt Readings from Last 3 Encounters:  08/01/23 72.6 kg (160 lb)  06/26/23 75.2 kg (165 lb 12.8 oz)  04/13/23 74.8 kg (165 lb)   BP 102/60   Pulse 77   Wt 72.6 kg (160 lb)   LMP 03/12/2019 (Approximate)   SpO2 99%   BMI 26.63 kg/m   PHYSICAL EXAM: General: NAD Neck: No JVD, no thyromegaly or thyroid  nodule.  Lungs: Clear to auscultation bilaterally with normal respiratory effort. CV: Nondisplaced PMI.  Heart regular S1/S2, no S3/S4, no murmur.  No peripheral edema.  No carotid bruit.  Normal pedal pulses.  Abdomen: Soft, nontender, no hepatosplenomegaly, no distention.  Skin: Intact without lesions or rashes.  Neurologic:  Alert and oriented x 3.  Psych: Normal affect. Extremities:  No clubbing or cyanosis.  HEENT: Normal.   ASSESSMENT & PLAN: 1. Chronic Systolic CHF:  Nonischemic cardiomyopathy.  Medtronic CRT-D device.  Echo 5/24 with EF <20%, GIIIDD, RV mod reduced, mod elevated PASP ~58, LA mod dilated, RA mildly dilated, mild MR, mod TR. NICM. R/LHC showed normal coronaries, elevated filling pressures and low output, CI 2.1.  Cardiac MRI in 8/24 showed no delayed enhancement.  Echo was done 9/24, EF 25% with septal-lateral dyssynchrony, mild RV dysfunction, and IVC normal.  Etiology for cardiomyopathy uncertain. Previous history of heavy alcohol  use but quit a number of years ago. HIV negative.  No viral-type illness prior to diagnosis. She has a FH of CHF (sister and mother). 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 NYHA class II, not volume overloaded by exam or Optivol.  - Continue torsemide  40 mg daily. BMET/BNP today.  - Continue spironolactone  25 mg daily.  - Continue Farxiga  10 mg daily. - Add bisoprolol  2.5 mg at bedtime.  - Continue losartan  12.5 mg at night (hypotension on Entresto ) - Refer to Dr. Verdis Glade for genetic counseling given mutations of uncertain significance on Invitae testing.  - I will arrange for repeat echo now that she is post-CRT. 2. HTN: BP now running on the low side.  3. DMII: Continue SGLT2i. 4. Tobacco Abuse: She has almost quit smoking.  I encouraged her to continue to work to get off cigarettes.  5. DVT: Left leg DVT found 5/24.  Had DVT back in 1980s after she delivered her son. Hypercoagulable workup negative.  - Long-term Eliquis  given unprovoked DVT. CBC today.   Follow up in 3 months with APP.   I spent 31 minutes reviewing records, interviewing/examining patient, and managing orders.    Peder Bourdon, MD 08/01/23

## 2023-08-01 NOTE — Patient Instructions (Signed)
 START Bisoprolol  2.5 mg ( 1/2 Tab) nightly.  Labs done today, your results will be available in MyChart, we will contact you for abnormal readings.  Your physician has requested that you have an echocardiogram. Echocardiography is a painless test that uses sound waves to create images of your heart. It provides your doctor with information about the size and shape of your heart and how well your heart's chambers and valves are working. This procedure takes approximately one hour. There are no restrictions for this procedure. Please do NOT wear cologne, perfume, aftershave, or lotions (deodorant is allowed). Please arrive 15 minutes prior to your appointment time.  Please note: We ask at that you not bring children with you during ultrasound (echo/ vascular) testing. Due to room size and safety concerns, children are not allowed in the ultrasound rooms during exams. Our front office staff cannot provide observation of children in our lobby area while testing is being conducted. An adult accompanying a patient to their appointment will only be allowed in the ultrasound room at the discretion of the ultrasound technician under special circumstances. We apologize for any inconvenience.  You have been referred to Dr. Verdis Glade. Her office will call you to arrange your appointment.  Your physician recommends that you schedule a follow-up appointment in: 3 months.  If you have any questions or concerns before your next appointment please send us  a message through [place] or call our office at [phone].    TO LEAVE A MESSAGE FOR THE NURSE SELECT OPTION 2, PLEASE LEAVE A MESSAGE INCLUDING: YOUR NAME DATE OF BIRTH CALL BACK NUMBER REASON FOR CALL**this is important as we prioritize the call backs  YOU WILL RECEIVE A CALL BACK THE SAME DAY AS LONG AS YOU CALL BEFORE 4:00 PM  At the [facility], you and your health needs are our priority. As part of our continuing mission to  provide you with exceptional heart care, we have created designated Provider Care Teams. These Care Teams include your primary Cardiologist (physician) and Advanced Practice Providers (APPs- Physician Assistants and Nurse Practitioners) who all work together to provide you with the care you need, when you need it.   You may see any of the following providers on your designated Care Team at your next follow up: Dr Jules Oar Dr Peder Bourdon Dr. Alwin Baars Dr. Arta Lark Amy Marijane Shoulders, NP Ruddy Corral, [place] [facility] [place], [place] Dennise Fitz, NP [place] Lee, NP Shawnee Dellen, NP Luster Salters, PharmD Bevely Brush, PharmD   Please be sure to bring in all your medications bottles to every appointment.    Thank you for choosing [facility]

## 2023-08-02 ENCOUNTER — Other Ambulatory Visit (HOSPITAL_COMMUNITY): Payer: Self-pay

## 2023-08-02 ENCOUNTER — Other Ambulatory Visit: Payer: Self-pay

## 2023-08-03 ENCOUNTER — Other Ambulatory Visit (HOSPITAL_COMMUNITY): Payer: Self-pay

## 2023-08-03 ENCOUNTER — Other Ambulatory Visit: Payer: Self-pay

## 2023-08-03 MED ORDER — DAPAGLIFLOZIN PROPANEDIOL 10 MG PO TABS
10.0000 mg | ORAL_TABLET | Freq: Every day | ORAL | 1 refills | Status: DC
  Filled 2023-08-03: qty 30, 30d supply, fill #0
  Filled 2023-09-01: qty 30, 30d supply, fill #1

## 2023-08-03 NOTE — Telephone Encounter (Signed)
 This is a CHF pt

## 2023-08-11 ENCOUNTER — Emergency Department (HOSPITAL_COMMUNITY)

## 2023-08-11 ENCOUNTER — Emergency Department (HOSPITAL_COMMUNITY)
Admission: EM | Admit: 2023-08-11 | Discharge: 2023-08-11 | Discharge status: Against Medical Advice | Attending: Emergency Medicine | Admitting: Emergency Medicine

## 2023-08-11 ENCOUNTER — Other Ambulatory Visit: Payer: Self-pay

## 2023-08-11 ENCOUNTER — Encounter (HOSPITAL_COMMUNITY): Payer: Self-pay | Admitting: *Deleted

## 2023-08-11 ENCOUNTER — Ambulatory Visit (HOSPITAL_COMMUNITY)
Admission: RE | Admit: 2023-08-11 | Discharge: 2023-08-11 | Disposition: A | Discharge status: Home / Self Care | Source: Ambulatory Visit | Attending: Cardiology | Admitting: Cardiology

## 2023-08-11 DIAGNOSIS — I509 Heart failure, unspecified: Secondary | ICD-10-CM | POA: Diagnosis not present

## 2023-08-11 DIAGNOSIS — R079 Chest pain, unspecified: Secondary | ICD-10-CM | POA: Insufficient documentation

## 2023-08-11 DIAGNOSIS — Z5321 Procedure and treatment not carried out due to patient leaving prior to being seen by health care provider: Secondary | ICD-10-CM | POA: Insufficient documentation

## 2023-08-11 DIAGNOSIS — I5022 Chronic systolic (congestive) heart failure: Secondary | ICD-10-CM | POA: Insufficient documentation

## 2023-08-11 DIAGNOSIS — I371 Nonrheumatic pulmonary valve insufficiency: Secondary | ICD-10-CM | POA: Insufficient documentation

## 2023-08-11 LAB — BASIC METABOLIC PANEL WITH GFR
Anion gap: 10 (ref 5–15)
BUN: 13 mg/dL (ref 6–20)
CO2: 27 mmol/L (ref 22–32)
Calcium: 9.4 mg/dL (ref 8.9–10.3)
Chloride: 102 mmol/L (ref 98–111)
Creatinine, Ser: 1.25 mg/dL — ABNORMAL HIGH (ref 0.44–1.00)
GFR, Estimated: 50 mL/min — ABNORMAL LOW (ref >60)
Glucose, Bld: 94 mg/dL (ref 70–99)
Potassium: 3.8 mmol/L (ref 3.5–5.1)
Sodium: 139 mmol/L (ref 135–145)

## 2023-08-11 LAB — CBC
HCT: 38.9 % (ref 36.0–46.0)
Hemoglobin: 11.9 g/dL — ABNORMAL LOW (ref 12.0–15.0)
MCH: 26.9 pg (ref 26.0–34.0)
MCHC: 30.6 g/dL (ref 30.0–36.0)
MCV: 87.8 fL (ref 80.0–100.0)
Platelets: 115 10*3/uL — ABNORMAL LOW (ref 150–400)
RBC: 4.43 MIL/uL (ref 3.87–5.11)
RDW: 14 % (ref 11.5–15.5)
WBC: 7.3 10*3/uL (ref 4.0–10.5)
nRBC: 0 % (ref 0.0–0.2)

## 2023-08-11 LAB — TROPONIN I (HIGH SENSITIVITY): Troponin I (High Sensitivity): 6 ng/L (ref <18)

## 2023-08-11 LAB — ECHOCARDIOGRAM COMPLETE
Area-P 1/2: 5.27 cm2
Calc EF: 41.3 %
S' Lateral: 4 cm
Single Plane A2C EF: 44.4 %
Single Plane A4C EF: 36.7 %

## 2023-08-11 NOTE — ED Notes (Signed)
 Unable to locate patient after xr. Multiple attempts by triage and sort staff unsuccessful.

## 2023-08-11 NOTE — Progress Notes (Signed)
  Echocardiogram 2D Echocardiogram has been performed.  Dione Franks 08/11/2023, 2:47 PM

## 2023-08-11 NOTE — ED Notes (Signed)
 The charge nurse has been notified

## 2023-08-11 NOTE — ED Notes (Signed)
 This pt went to xray but was supposed to come back to room 3 in triage so the pa could see her now I cannot locate her   she's not back  in triage nor is she in the waiting room  no idea where this pt is

## 2023-08-11 NOTE — ED Triage Notes (Signed)
 Chest pain for 3-5 days she has a hx of chf and dvts  she saw her cardiologist within the past week and everything checked out

## 2023-08-14 ENCOUNTER — Encounter (HOSPITAL_COMMUNITY): Payer: Self-pay | Admitting: *Deleted

## 2023-08-14 ENCOUNTER — Other Ambulatory Visit (HOSPITAL_COMMUNITY): Payer: Self-pay | Admitting: Family Medicine

## 2023-08-14 ENCOUNTER — Other Ambulatory Visit: Payer: Self-pay

## 2023-08-14 ENCOUNTER — Other Ambulatory Visit (HOSPITAL_COMMUNITY): Payer: Self-pay

## 2023-08-14 ENCOUNTER — Other Ambulatory Visit: Payer: Self-pay | Admitting: Nurse Practitioner

## 2023-08-14 DIAGNOSIS — G894 Chronic pain syndrome: Secondary | ICD-10-CM

## 2023-08-14 DIAGNOSIS — I330 Acute and subacute infective endocarditis: Secondary | ICD-10-CM

## 2023-08-14 DIAGNOSIS — I071 Rheumatic tricuspid insufficiency: Secondary | ICD-10-CM

## 2023-08-15 NOTE — Telephone Encounter (Signed)
    Dear Kendra Griffith  You are scheduled for a TEE (Transesophageal Echocardiogram) on Wednesday, May 14 at 830am with Dr. Mitzie Anda.  Please arrive at the [facility] (Main Entrance A) at [facility]: [address] [place], [place] [address] at 8:30 AM (This time is 1 hour(s) before your procedure to ensure your preparation).   Free valet parking service is available. You will check in at ADMITTING.   *Please Note: You will receive a call the day before your procedure to confirm the appointment time. That time may have changed from the original time based on the schedule for that day.*    DIET:  Nothing to eat or drink after midnight except a sip of water with medications (see medication instructions below)  MEDICATION INSTRUCTIONS: !!IF ANY NEW MEDICATIONS ARE STARTED AFTER TODAY, PLEASE NOTIFY YOUR PROVIDER AS SOON AS POSSIBLE!!  FYI: Medications such as Semaglutide (Ozempic, [place]), Tirzepatide (Mounjaro, Zepbound), Dulaglutide (Trulicity), etc ("GLP1 agonists") AND Canagliflozin (Invokana), Dapagliflozin  (Farxiga ), Empagliflozin (Jardiance), Ertugliflozin (Steglatro), Bexagliflozin [employer]) or any combination with one of these drugs such as Invokamet (Canagliflozin/Metformin ), Synjardy (Empagliflozin/Metformin ), etc ("SGLT2 inhibitors") must be held around the time of a procedure. This is not a comprehensive list of all of these drugs. Please review all of your medications and talk to your provider if you take any one of these. If you are not sure, ask your provider.  HOLD: Dapagliflozin  (Farxiga ) for 2 days prior to the procedure. Last dose on Sunday, May 11.  Continue taking your anticoagulant (blood thinner): Apixaban  (Eliquis ).  You will need to continue this after your procedure until you are told by your provider that it is safe to stop.    LABS:  Recent labs done, no need for additional labs at this time FYI:  For your safety, and to allow us  to monitor your vital signs  accurately during the surgery/procedure we request: If you have artificial nails, gel coating, SNS etc, please have those removed prior to your surgery/procedure. Not having the nail coverings /polish removed may result in cancellation or delay of your surgery/procedure.  Your support person will be asked to wait in the waiting room during your procedure.  It is OK to have someone drop you off and come back when you are ready to be discharged.  You cannot drive after the procedure and will need someone to drive you home.  Bring your insurance cards.  *Special Note: Every effort is made to have your procedure done on time. Occasionally there are emergencies that occur at the hospital that may cause delays. Please be patient if a delay does occur.

## 2023-08-17 ENCOUNTER — Ambulatory Visit: Payer: Medicaid Other | Admitting: Psychology

## 2023-08-17 ENCOUNTER — Other Ambulatory Visit (HOSPITAL_COMMUNITY): Payer: Self-pay

## 2023-08-17 ENCOUNTER — Ambulatory Visit (HOSPITAL_COMMUNITY)
Admission: RE | Admit: 2023-08-17 | Discharge: 2023-08-17 | Disposition: A | Discharge status: Home / Self Care | Source: Ambulatory Visit | Attending: Internal Medicine | Admitting: Internal Medicine

## 2023-08-17 ENCOUNTER — Other Ambulatory Visit: Payer: Self-pay

## 2023-08-17 DIAGNOSIS — I330 Acute and subacute infective endocarditis: Secondary | ICD-10-CM | POA: Insufficient documentation

## 2023-08-17 DIAGNOSIS — I071 Rheumatic tricuspid insufficiency: Secondary | ICD-10-CM | POA: Diagnosis present

## 2023-08-17 LAB — CBC
HCT: 42.1 % (ref 36.0–46.0)
Hemoglobin: 13.1 g/dL (ref 12.0–15.0)
MCH: 27 pg (ref 26.0–34.0)
MCHC: 31.1 g/dL (ref 30.0–36.0)
MCV: 86.8 fL (ref 80.0–100.0)
Platelets: 123 10*3/uL — ABNORMAL LOW (ref 150–400)
RBC: 4.85 MIL/uL (ref 3.87–5.11)
RDW: 14.2 % (ref 11.5–15.5)
WBC: 7.3 10*3/uL (ref 4.0–10.5)
nRBC: 0 % (ref 0.0–0.2)

## 2023-08-17 MED ORDER — POTASSIUM CHLORIDE CRYS ER 20 MEQ PO TBCR
40.0000 meq | EXTENDED_RELEASE_TABLET | Freq: Every day | ORAL | 6 refills | Status: DC
  Filled 2023-08-17: qty 60, 30d supply, fill #0

## 2023-08-18 ENCOUNTER — Encounter (HOSPITAL_COMMUNITY): Payer: Self-pay

## 2023-08-21 ENCOUNTER — Other Ambulatory Visit (HOSPITAL_COMMUNITY): Payer: Self-pay

## 2023-08-22 ENCOUNTER — Ambulatory Visit (HOSPITAL_COMMUNITY): Payer: Self-pay

## 2023-08-22 LAB — CULTURE, BLOOD (SINGLE): Culture: NO GROWTH

## 2023-08-23 ENCOUNTER — Telehealth (HOSPITAL_COMMUNITY): Payer: Self-pay | Admitting: Internal Medicine

## 2023-08-23 DIAGNOSIS — I361 Nonrheumatic tricuspid (valve) insufficiency: Secondary | ICD-10-CM

## 2023-08-23 NOTE — Telephone Encounter (Signed)
 Good morning,  This patient has called front office yesterday and today in regards to her procedure that she has scheduled coming up in the cath lab.    Patient needs to speak with clinical staff in regards to her procedure - - patient stated she would like to cancel procedure and no one has returned her call from yesterday.   Please advise.  Thank you.

## 2023-08-23 NOTE — Telephone Encounter (Signed)
 Spoke w/pt, she is unable to have TEE tomorrow and req to resch it out a couple of weeks. TEE resch to Tue 6/10 at 7:30 AM, instructions reviewed with pt via phone and sent via mychart

## 2023-08-24 ENCOUNTER — Encounter: Payer: Medicaid Other | Admitting: Psychology

## 2023-08-24 DIAGNOSIS — I361 Nonrheumatic tricuspid (valve) insufficiency: Secondary | ICD-10-CM

## 2023-08-29 NOTE — Telephone Encounter (Signed)
 Pt called to request to reschedule procedure Reports 6/10 is not a good date  Will reschedule 5/21

## 2023-09-01 ENCOUNTER — Other Ambulatory Visit: Payer: Self-pay

## 2023-09-07 ENCOUNTER — Other Ambulatory Visit: Payer: Self-pay | Admitting: Nurse Practitioner

## 2023-09-07 ENCOUNTER — Telehealth: Payer: Self-pay

## 2023-09-07 DIAGNOSIS — G894 Chronic pain syndrome: Secondary | ICD-10-CM

## 2023-09-07 DIAGNOSIS — F32A Depression, unspecified: Secondary | ICD-10-CM

## 2023-09-07 NOTE — Telephone Encounter (Signed)
 Spoke w/pt TEE resch to Fri 6/13 at 7:30 am, 6:30 am arrival, all instructions reviewed with pt via phone and sent via mychart

## 2023-09-07 NOTE — Telephone Encounter (Signed)
 Kendra Griffith has request that Ms. Droll has a visit to complete incontinence supply orders.  I was unable to reach patient by phone and did leave a voicemail to call our office back to schedule.

## 2023-09-07 NOTE — Telephone Encounter (Signed)
 Copied from CRM [id number]. Topic: General - Other >> Sep 07, 2023 12:55 PM Marissa P wrote: Reason for CRM: Patient called back in regards to Roberts Ching, CMA message:  "Ms. Jamal Mays has request that Ms. Reedy has a visit to complete incontinence supply orders.  I was unable to reach patient by phone and did leave a voicemail to call our office back to schedule."  Patient advised that she does not need the supplies anymore nor needs to be scheduled please

## 2023-09-07 NOTE — Telephone Encounter (Unsigned)
 Copied from CRM [id number]. Topic: Clinical - Medication Refill >> Sep 07, 2023  1:03 PM Marissa P wrote: Medication: gabapentin  (NEURONTIN ) 300 MG capsule And acetaminophen -codeine  (TYLENOL  #3) 300-30 MG tablet  Has the patient contacted their pharmacy? No (Agent: If no, request that the patient contact the pharmacy for the refill. If patient does not wish to contact the pharmacy document the reason why and proceed with request.) (Agent: If yes, when and what did the pharmacy advise?)  This is the patient's preferred pharmacy:  [facility] [address] [place] [address] Phone: [phone] Fax: [phone]  Is this the correct pharmacy for this prescription? Yes If no, delete pharmacy and type the correct one.   Has the prescription been filled recently? Yes  Is the patient out of the medication? No, about to be out of medication   Has the patient been seen for an appointment in the last year OR does the patient have an upcoming appointment? No  Can we respond through MyChart? Yes  Agent: Please be advised that Rx refills may take up to 3 business days. We ask that you follow-up with your pharmacy.

## 2023-09-08 ENCOUNTER — Other Ambulatory Visit: Payer: Self-pay

## 2023-09-08 MED ORDER — GABAPENTIN 300 MG PO CAPS
300.0000 mg | ORAL_CAPSULE | Freq: Three times a day (TID) | ORAL | 1 refills | Status: DC | PRN
Start: 2023-09-08 — End: 2023-12-13
  Filled 2023-09-08: qty 90, 30d supply, fill #0

## 2023-09-19 DIAGNOSIS — I361 Nonrheumatic tricuspid (valve) insufficiency: Secondary | ICD-10-CM

## 2023-09-21 NOTE — Progress Notes (Signed)
 Spoke to patient and instructed them to come at 0630  and to be NPO after 0000.  Medications reviewed.    Confirmed that patient will have a ride home and someone to stay with them for 24 hours after the procedure.

## 2023-09-22 ENCOUNTER — Ambulatory Visit (INDEPENDENT_AMBULATORY_CARE_PROVIDER_SITE_OTHER): Payer: Medicaid Other

## 2023-09-22 ENCOUNTER — Ambulatory Visit (HOSPITAL_COMMUNITY): Admitting: Anesthesiology

## 2023-09-22 ENCOUNTER — Encounter (HOSPITAL_COMMUNITY)
Admission: RE | Disposition: A | Discharge status: Home / Self Care | Payer: Self-pay | Source: Home / Self Care | Attending: Cardiology

## 2023-09-22 ENCOUNTER — Encounter (HOSPITAL_COMMUNITY): Payer: Self-pay | Admitting: Cardiology

## 2023-09-22 ENCOUNTER — Other Ambulatory Visit: Payer: Self-pay

## 2023-09-22 ENCOUNTER — Ambulatory Visit (HOSPITAL_COMMUNITY)

## 2023-09-22 ENCOUNTER — Ambulatory Visit (HOSPITAL_COMMUNITY)
Admission: RE | Admit: 2023-09-22 | Discharge: 2023-09-22 | Disposition: A | Discharge status: Home / Self Care | Attending: Cardiology | Admitting: Cardiology

## 2023-09-22 DIAGNOSIS — F17210 Nicotine dependence, cigarettes, uncomplicated: Secondary | ICD-10-CM | POA: Insufficient documentation

## 2023-09-22 DIAGNOSIS — I110 Hypertensive heart disease with heart failure: Secondary | ICD-10-CM | POA: Diagnosis not present

## 2023-09-22 DIAGNOSIS — K219 Gastro-esophageal reflux disease without esophagitis: Secondary | ICD-10-CM | POA: Insufficient documentation

## 2023-09-22 DIAGNOSIS — I361 Nonrheumatic tricuspid (valve) insufficiency: Secondary | ICD-10-CM

## 2023-09-22 DIAGNOSIS — E119 Type 2 diabetes mellitus without complications: Secondary | ICD-10-CM | POA: Insufficient documentation

## 2023-09-22 DIAGNOSIS — D573 Sickle-cell trait: Secondary | ICD-10-CM | POA: Insufficient documentation

## 2023-09-22 DIAGNOSIS — Z5986 Financial insecurity: Secondary | ICD-10-CM | POA: Diagnosis not present

## 2023-09-22 DIAGNOSIS — I509 Heart failure, unspecified: Secondary | ICD-10-CM | POA: Diagnosis not present

## 2023-09-22 DIAGNOSIS — I428 Other cardiomyopathies: Secondary | ICD-10-CM

## 2023-09-22 DIAGNOSIS — Z5941 Food insecurity: Secondary | ICD-10-CM | POA: Insufficient documentation

## 2023-09-22 HISTORY — PX: TRANSESOPHAGEAL ECHOCARDIOGRAM (CATH LAB): EP1270

## 2023-09-22 LAB — POCT I-STAT, CHEM 8
BUN: 21 mg/dL — ABNORMAL HIGH (ref 6–20)
Calcium, Ion: 1.3 mmol/L (ref 1.15–1.40)
Chloride: 102 mmol/L (ref 98–111)
Creatinine, Ser: 1.3 mg/dL — ABNORMAL HIGH (ref 0.44–1.00)
Glucose, Bld: 108 mg/dL — ABNORMAL HIGH (ref 70–99)
HCT: 39 % (ref 36.0–46.0)
Hemoglobin: 13.3 g/dL (ref 12.0–15.0)
Potassium: 3.8 mmol/L (ref 3.5–5.1)
Sodium: 140 mmol/L (ref 135–145)
TCO2: 26 mmol/L (ref 22–32)

## 2023-09-22 LAB — CUP PACEART REMOTE DEVICE CHECK
Battery Remaining Longevity: 135 mo
Battery Voltage: 3.04 V
Brady Statistic AP VP Percent: 2.75 %
Brady Statistic AP VS Percent: 0.08 %
Brady Statistic AS VP Percent: 89.22 %
Brady Statistic AS VS Percent: 7.95 %
Brady Statistic RA Percent Paced: 3.91 %
Brady Statistic RV Percent Paced: 3.18 %
Date Time Interrogation Session: 20250612200133
HighPow Impedance: 64 Ohm
Implantable Lead Connection Status: 753985
Implantable Lead Connection Status: 753985
Implantable Lead Connection Status: 753985
Implantable Lead Implant Date: 20241212
Implantable Lead Implant Date: 20241212
Implantable Lead Implant Date: 20241212
Implantable Lead Location: 753858
Implantable Lead Location: 753859
Implantable Lead Location: 753860
Implantable Lead Model: 4598
Implantable Lead Model: 5076
Implantable Pulse Generator Implant Date: 20241212
Lead Channel Impedance Value: 418 Ohm
Lead Channel Impedance Value: 418 Ohm
Lead Channel Impedance Value: 437 Ohm
Lead Channel Impedance Value: 494 Ohm
Lead Channel Impedance Value: 494 Ohm
Lead Channel Impedance Value: 532 Ohm
Lead Channel Impedance Value: 532 Ohm
Lead Channel Impedance Value: 627 Ohm
Lead Channel Impedance Value: 817 Ohm
Lead Channel Impedance Value: 855 Ohm
Lead Channel Impedance Value: 912 Ohm
Lead Channel Impedance Value: 931 Ohm
Lead Channel Impedance Value: 950 Ohm
Lead Channel Pacing Threshold Amplitude: 0.5 V
Lead Channel Pacing Threshold Amplitude: 0.625 V
Lead Channel Pacing Threshold Amplitude: 0.625 V
Lead Channel Pacing Threshold Pulse Width: 0.4 ms
Lead Channel Pacing Threshold Pulse Width: 0.4 ms
Lead Channel Pacing Threshold Pulse Width: 0.4 ms
Lead Channel Sensing Intrinsic Amplitude: 13.3 mV
Lead Channel Sensing Intrinsic Amplitude: 2.6 mV
Lead Channel Setting Pacing Amplitude: 1.25 V
Lead Channel Setting Pacing Amplitude: 1.5 V
Lead Channel Setting Pacing Amplitude: 2 V
Lead Channel Setting Pacing Pulse Width: 0.4 ms
Lead Channel Setting Pacing Pulse Width: 0.4 ms
Lead Channel Setting Sensing Sensitivity: 0.3 mV
Zone Setting Status: 755011
Zone Setting Status: 755011
Zone Setting Status: 755011

## 2023-09-22 SURGERY — TRANSESOPHAGEAL ECHOCARDIOGRAM (TEE) (CATHLAB)
Anesthesia: Monitor Anesthesia Care

## 2023-09-22 MED ORDER — LIDOCAINE 2% (20 MG/ML) 5 ML SYRINGE
INTRAMUSCULAR | Status: DC | PRN
  Administered 2023-09-22: 50 mg via INTRAVENOUS

## 2023-09-22 MED ORDER — SODIUM CHLORIDE 0.9 % IV SOLN: INTRAVENOUS | Status: DC

## 2023-09-22 MED ORDER — PROPOFOL 10 MG/ML IV BOLUS
INTRAVENOUS | Status: DC | PRN
  Administered 2023-09-22: 30 mg via INTRAVENOUS

## 2023-09-22 MED ORDER — PROPOFOL 500 MG/50ML IV EMUL
INTRAVENOUS | Status: DC | PRN
  Administered 2023-09-22: 100 ug/kg/min via INTRAVENOUS

## 2023-09-22 MED ORDER — ONDANSETRON HCL 4 MG/2ML IJ SOLN
INTRAMUSCULAR | Status: DC | PRN
  Administered 2023-09-22: 4 mg via INTRAVENOUS

## 2023-09-22 NOTE — Anesthesia Preprocedure Evaluation (Addendum)
 Anesthesia Evaluation  Patient identified by MRN, date of birth, ID band Patient awake    Reviewed: Allergy & Precautions, NPO status , Patient's Chart, lab work & pertinent test results  Airway Mallampati: I  TM Distance: >3 FB Neck ROM: Full    Dental  (+) Edentulous Upper, Edentulous Lower   Pulmonary Current Smoker and Patient abstained from smoking.   breath sounds clear to auscultation       Cardiovascular hypertension, Pt. on medications +CHF   Rhythm:Regular Rate:Normal  Echo:   1. Left ventricular ejection fraction, by estimation, is 35 to 40%. The  left ventricle has moderately decreased function. The left ventricle  demonstrates global hypokinesis. Left ventricular diastolic parameters are  consistent with Grade I diastolic  dysfunction (impaired relaxation).   2. 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 There is a mobile structure in close  proximity to both the tricuspid valve and the RV lead, possible vegetation  (1.13 x 0.62 cm). Suggest TEE for evaluation. Right ventricular systolic  function is normal. The right  ventricular size is normal. Tricuspid regurgitation signal is inadequate  for assessing PA pressure.   3. The mitral valve is normal in structure. No evidence of mitral valve  regurgitation. No evidence of mitral stenosis.   4. The aortic valve is tricuspid. Aortic valve regurgitation is not  visualized. No aortic stenosis is present.   5. The inferior vena cava is normal in size with greater than 50%  respiratory variability, suggesting right atrial pressure of 3 mmHg.     Neuro/Psych  PSYCHIATRIC DISORDERS Anxiety Depression Bipolar Disorder    Neuromuscular disease    GI/Hepatic Neg liver ROS,GERD  Medicated,,  Endo/Other  diabetes    Renal/GU negative Renal ROS     Musculoskeletal negative musculoskeletal ROS (+)    Abdominal   Peds  Hematology  (+) Blood dyscrasia, Sickle cell trait and anemia    Anesthesia Other Findings   Reproductive/Obstetrics                             Anesthesia Physical Anesthesia Plan  ASA: 3  Anesthesia Plan: MAC   Post-op Pain Management: Minimal or no pain anticipated   Induction: Intravenous  PONV Risk Score and Plan: 0 and Propofol  infusion  Airway Management Planned: Natural Airway and Nasal Cannula  Additional Equipment: None  Intra-op Plan:   Post-operative Plan:   Informed Consent: I have reviewed the patients History and Physical, chart, labs and discussed the procedure including the risks, benefits and alternatives for the proposed anesthesia with the patient or authorized representative who has indicated his/her understanding and acceptance.       Plan Discussed with: CRNA  Anesthesia Plan Comments:        Anesthesia Quick Evaluation

## 2023-09-22 NOTE — Transfer of Care (Signed)
 Immediate Anesthesia Transfer of Care Note  Patient: Kendra Griffith  Procedure(s) Performed: TRANSESOPHAGEAL ECHOCARDIOGRAM  Patient Location: PACU  Anesthesia Type:MAC  Level of Consciousness: awake, oriented, and drowsy  Airway & Oxygen Therapy: Patient Spontanous Breathing  Post-op Assessment: Report given to RN and Post -op Vital signs reviewed and stable  Post vital signs: Reviewed and stable  Last Vitals:  Vitals Value Taken Time  BP    Temp    Pulse 63 09/22/23 08:01  Resp 32 09/22/23 08:01  SpO2 94 % 09/22/23 08:01  Vitals shown include unfiled device data.  Last Pain:  Vitals:   09/22/23 0650  TempSrc: Temporal  PainSc: 0-No pain         Complications: There were no known notable events for this encounter.

## 2023-09-22 NOTE — CV Procedure (Signed)
 Procedure: TEE  Sedation: Per anesthesiology  Indication: TV vs ICD vegetation  Findings: Please see echo section for full report.  Normal LV size with EF 35-40%, diffuse hypokinesis.  Normal RV size and systolic function.  Normal left atrial size with no LA appendage thrombus.  Normal RA size with no PFO/ASD by color doppler.  There is an ICD in the right heart, no vegetation noted on the ICD wires.  There is mild TR, RV-RA gradient 24 mmHg.  There is no vegetation seen on the tricuspid valve.  Trivial MR.  Trileaflet aortic valve with no AS or AI.  Normal caliber thoracic aorta with mild plaque.   No TV or ICD vegetation was noted.   Kendra Griffith 09/22/2023 7:57 AM

## 2023-09-22 NOTE — Anesthesia Postprocedure Evaluation (Signed)
 Anesthesia Post Note  Patient: Kendra Griffith  Procedure(s) Performed: TRANSESOPHAGEAL ECHOCARDIOGRAM     Patient location during evaluation: PACU Anesthesia Type: MAC Level of consciousness: awake and alert Pain management: pain level controlled Vital Signs Assessment: post-procedure vital signs reviewed and stable Respiratory status: spontaneous breathing, nonlabored ventilation, respiratory function stable and patient connected to nasal cannula oxygen Cardiovascular status: stable and blood pressure returned to baseline Postop Assessment: no apparent nausea or vomiting Anesthetic complications: no  There were no known notable events for this encounter.  Last Vitals:  Vitals:   09/22/23 0825 09/22/23 0831  BP: 108/84 (!) 122/105  Pulse:    Resp: 17 15  Temp:    SpO2:  98%    Last Pain:  Vitals:   09/22/23 0831  TempSrc:   PainSc: 0-No pain                 Willian Harrow

## 2023-09-22 NOTE — H&P (Signed)
 Advanced Heart Failure Team History and Physical Note   PCP:  Collins Dean, NP  PCP-Cardiology: None     Reason for Admission: TEE   HPI:    Possible vegetation on TV vs device lead on surface echo, blood cultures negative.  Patient denies fever, WBCs were normal when checked.    Home Medications Prior to Admission medications   Medication Sig Start Date End Date Taking? Authorizing Provider  acetaminophen -codeine  (TYLENOL  #3) 300-30 MG tablet Take 1-2 tablets by mouth every 4 (four) hours as needed for moderate pain (pain score 4-6). 03/15/23  Yes Fleming, Zelda W, NP  albuterol  (VENTOLIN  HFA) 108 (90 Base) MCG/ACT inhaler Inhale 2 puffs into the lungs every 6 (six) hours as needed for wheezing or shortness of breath. 07/08/22  Yes Collins Dean, NP  amitriptyline  (ELAVIL ) 75 MG tablet Take 1 tablet (75 mg total) by mouth at bedtime for pain and depression Patient taking differently: Take 75 mg by mouth at bedtime as needed for sleep. 08/17/22  Yes Collins Dean, NP  apixaban  (ELIQUIS ) 5 MG TABS tablet Take 1 tablet (5 mg total) by mouth 2 (two) times daily. 06/23/23  Yes Simmons, Brittainy M, PA-C  BIOTIN 5000 PO Take 1 tablet by mouth daily.   Yes [provider]  bisoprolol  (ZEBETA ) 5 MG tablet Take 0.5 tablets (2.5 mg total) by mouth daily. 08/01/23  Yes Darlis Eisenmenger, MD  cholecalciferol (VITAMIN D3) 25 MCG (1000 UT) tablet Take 1,000 Units by mouth daily.   Yes [provider]  dapagliflozin  propanediol (FARXIGA ) 10 MG TABS tablet Take 1 tablet (10 mg total) by mouth daily. NEEDS FOLLOW UP APPOINTMENT FOR MORE REFILLS 08/03/23  Yes Ruddy Corral M, PA-C  famotidine  (PEPCID ) 20 MG tablet Take 1 tablet (20 mg total) by mouth 2 (two) times daily for heartburn Patient taking differently: Take 20 mg by mouth daily. 08/17/22  Yes Collins Dean, NP  gabapentin  (NEURONTIN ) 300 MG capsule Take 1 capsule (300 mg total) by mouth 3 (three) times daily as  needed (Pain). 09/08/23  Yes Fleming, Zelda W, NP  hydrOXYzine  (ATARAX ) 50 MG tablet Take 1 tablet (50 mg total) by mouth 3 (three) times daily as needed. Please deliver 10/11/22  Yes Fleming, Zelda W, NP  ibuprofen  (ADVIL ) 200 MG tablet Take 200-800 mg by mouth every 6 (six) hours as needed for moderate pain (pain score 4-6).   Yes [provider]  losartan  (COZAAR ) 25 MG tablet Take 0.5 tablets (12.5 mg total) by mouth at bedtime. 01/03/23 10/01/23 Yes Milford, Arlice Bene, FNP  Menthol, Topical Analgesic, (BIOFREEZE EX) Apply 1 spray topically daily as needed (foot pain). Menthol   Yes [provider]  Multiple Vitamins-Minerals (MULTIVITAMIN WITH MINERALS) tablet Take 1 tablet by mouth daily.   Yes [provider]  rosuvastatin  (CRESTOR ) 5 MG tablet Take 1 tablet (5 mg total) by mouth daily for cholesterol 05/26/23  Yes Newlin, Enobong, MD  spironolactone  (ALDACTONE ) 25 MG tablet Take 1 tablet (25 mg total) by mouth daily. 08/01/23  Yes Darlis Eisenmenger, MD  tetrahydrozoline 0.05 % ophthalmic solution Place 1 drop into both eyes daily as needed (itching /red eyes).   Yes [provider]  torsemide  (DEMADEX ) 20 MG tablet Take 2 tablets (40 mg total) by mouth daily. 02/14/23  Yes Milford, Arlice Bene, FNP  trolamine salicylate (ASPERCREME) 10 % cream Apply 1 Application topically daily as needed (Foot pain). With lidocaine    Yes [provider]  urea (CARMOL) 40 % CREA Apply 1 application  topically daily as needed (Foot pain).   Yes [provider]  vitamin B-12 (CYANOCOBALAMIN ) 500 MCG tablet Take 500 mcg by mouth daily as needed (Supplement).   Yes [provider]  nicotine  (NICODERM CQ  - DOSED IN MG/24 HR) 7 mg/24hr patch Place 1 patch (7 mg total) onto the skin daily. Patient not taking: Reported on 06/26/2023 03/29/23   Elmarie Hacking, FNP  potassium chloride  SA (KLOR-CON  M) 20 MEQ tablet Take 20 mEq by mouth daily.    [provider]  potassium chloride  SA (KLOR-CON  M) 20 MEQ tablet Take 2 tablets (40 mEq total) by mouth daily. 08/17/23   Elmarie Hacking, FNP    Past Medical History: Past Medical History:  Diagnosis Date   ABSCESS, TOOTH 08/20/2009   Qualifier: Diagnosis of  By: Earle Glatter, Scott     Anemia    Anxiety    Bipolar disorder (HCC)    Blood transfusion without reported diagnosis    childbirth   Chronic pain    feet and back   Clotting disorder (HCC)    childbirth   Dental caries    lesion soft palate   Depression    Diabetes mellitus without complication (HCC)    prediabetes   GERD (gastroesophageal reflux disease)    Hypertension    Injury    right side from jumping off bunkbed   Pre-diabetes    Sickle cell trait (HCC)    Wears glasses     Past Surgical History: Past Surgical History:  Procedure Laterality Date   BARTHOLIN CYST MARSUPIALIZATION N/A 07/16/2019   Procedure: EXCISION OF BARTHOLIN CYST;  Surgeon: Othelia Blinks, MD;  Location: [facility];  Service: Gynecology;  Laterality: N/A;   BIV ICD INSERTION CRT-D N/A 03/23/2023   Procedure: BIV ICD INSERTION CRT-D;  Surgeon: Lei Pump, MD;  Location: [facility] INVASIVE CV LAB;  Service: Cardiovascular;  Laterality: N/A;   CESAREAN SECTION     three   COLONOSCOPY     cyst removal 1997     FACIAL RECONSTRUCTION SURGERY     FOOT SURGERY Bilateral    LESION REMOVAL Left 10/15/2018   Procedure: Lesion Removal left soft palate;  Surgeon: Ascencion Lava, DDS;  Location: [facility] OR;  Service: Oral Surgery;  Laterality: Left;   RIGHT/LEFT HEART CATH AND CORONARY ANGIOGRAPHY N/A 08/29/2022   Procedure: RIGHT/LEFT HEART CATH AND CORONARY ANGIOGRAPHY;  Surgeon: Mardell Shade, MD;  Location: [facility] INVASIVE CV LAB;  Service: Cardiovascular;  Laterality: N/A;   TOOTH EXTRACTION Bilateral 10/15/2018   Procedure: DENTAL EXTRACTIONS OF TEETH NUMBER TWO, FOUR, FIVE, SIX, SEVEN, EIGHT, NINE, TEN, ELEVEN, TWELVE, THIRTEEN, FOURTEEN,  SEVENTEEN, TWENTY-ONE, TWENTY-TWO, TWENTY-THREE, TWENTY-FOUR, TWENTY-FIVE, TWENTY-SIX, TWENTY-SEVEN, TWENTY-EIGHT WITH ALVEOLOPLASTY;  Surgeon: Ascencion Lava, DDS;  Location: [facility] OR;  Service: Oral Surgery;  Laterality: Bilateral;   TUBAL LIGATION      Family History:  Family History  Problem Relation Age of Onset   Heart disease Mother    Asthma Sister    Diabetes Sister    COPD Sister    Diabetes Sister    Diabetes Sister    Breast cancer Neg Hx    Colon cancer Neg Hx    Esophageal cancer Neg Hx    Pancreatic cancer Neg Hx    Liver disease Neg Hx    Stomach cancer Neg Hx     Social History: Social History   Socioeconomic History  Marital status: Single    Spouse name: Not on file   Number of children: 3   Years of education: 8-9   Highest education level: 8th grade  Occupational History   Occupation: applying for disability  Tobacco Use   Smoking status: Every Day    Current packs/day: 2.00    Average packs/day: 2.0 packs/day for 29.0 years (58.0 ttl pk-yrs)    Types: Cigarettes   Smokeless tobacco: Never  Vaping Use   Vaping status: Never Used  Substance and Sexual Activity   Alcohol  use: No    Alcohol /week: 0.0 standard drinks of alcohol     Comment: Stopped drinking 2013   Drug use: No   Sexual activity: Not Currently    Birth control/protection: None  Other Topics Concern   Not on file  Social History Narrative   Patient lives alone in an apartment on the first floor.  3 children.  Applying for disability.  Education: 8th or 9th grade.   Currently not working   Trying to go back to school online for [employer]   Six Grandchildren   Social Drivers of Health   Financial Resource Strain: Medium Risk (09/02/2022)   Overall Financial Resource Strain (CARDIA)    Difficulty of Paying Living Expenses: Somewhat hard  Food Insecurity: Food Insecurity Present (10/26/2022)   Hunger Vital Sign    Worried About Running Out of Food in the Last Year: Sometimes true    Ran  Out of Food in the Last Year: Sometimes true  Transportation Needs: Unmet Transportation Needs (11/25/2022)   PRAPARE - Administrator, Civil Service (Medical): No    Lack of Transportation (Non-Medical): Yes  Physical Activity: Insufficiently Active (09/02/2022)   Exercise Vital Sign    Days of Exercise per Week: 1 day    Minutes of Exercise per Session: 30 min  Stress: Stress Concern Present (09/02/2022)   [employer] of Occupational Health - Occupational Stress Questionnaire    Feeling of Stress : To some extent  Social Connections: Moderately Isolated (09/02/2022)   Social Connection and Isolation Panel    Frequency of Communication with Friends and Family: Three times a week    Frequency of Social Gatherings with Friends and Family: Once a week    Attends Religious Services: Never    Database administrator or Organizations: Yes    Attends Banker Meetings: 1 to 4 times per year    Marital Status: Divorced    Allergies:  Allergies  Allergen Reactions   Amoxicillin     Pain   Darvocet [Propoxyphene N-Acetaminophen ] Nausea And Vomiting    Makes her jittery   Hydrocodone-Acetaminophen  Nausea And Vomiting    Upset stomach   Percocet [Oxycodone -Acetaminophen ] Other (See Comments)    Jittery   Valium [Diazepam] Other (See Comments)    Makes pt. Feel out of wack    Objective:    Vital Signs:   Temp:  [97.4 F (36.3 C)] 97.4 F (36.3 C) (06/13 0650) Pulse Rate:  [53] 53 (06/13 0650) Resp:  [11] 11 (06/13 0650) BP: (114)/(65) 114/65 (06/13 0650) SpO2:  [100 %] 100 % (06/13 0650)   There were no vitals filed for this visit.   Physical Exam     General:  Well appearing. No respiratory difficulty HEENT: Normal Neck: Supple. no JVD. Carotids 2+ bilat; no bruits. No lymphadenopathy or thyromegaly appreciated. Cor: PMI nondisplaced. Regular rate & rhythm. No rubs, gallops or murmurs. Lungs: Clear Abdomen: Soft, nontender, nondistended.  No  hepatosplenomegaly. No bruits or masses. Good bowel sounds. Extremities: No cyanosis, clubbing, rash, edema Neuro: Alert & oriented x 3, cranial nerves grossly intact. moves all 4 extremities w/o difficulty. Affect pleasant.  Labs     Basic Metabolic Panel: Recent Labs  Lab 09/22/23 0709  NA 140  K 3.8  CL 102  GLUCOSE 108*  BUN 21*  CREATININE 1.30*    Liver Function Tests: No results for input(s): AST, ALT, ALKPHOS, BILITOT, PROT, ALBUMIN  in the last 168 hours. No results for input(s): LIPASE, AMYLASE in the last 168 hours. No results for input(s): AMMONIA in the last 168 hours.  CBC: Recent Labs  Lab 09/22/23 0709  HGB 13.3  HCT 39.0    Cardiac Enzymes: No results for input(s): CKTOTAL, CKMB, CKMBINDEX, TROPONINI in the last 168 hours.  BNP: BNP (last 3 results) Recent Labs    12/15/22 1146 02/14/23 0911 08/01/23 1141  BNP 50.2 37.4 12.4    ProBNP (last 3 results) No results for input(s): PROBNP in the last 8760 hours.   CBG: No results for input(s): GLUCAP in the last 168 hours.  Coagulation Studies: No results for input(s): LABPROT, INR in the last 72 hours.  Imaging: No results found.   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  Plan for TEE today to fully assess.    Peder Bourdon, MD 09/22/2023, 7:25 AM Advanced Heart Failure Team Pager [phone] (M-F; 7a - 5p)  Please contact [facility] for night-coverage after hours (4p -7a ) and weekends on [email or web address]

## 2023-09-25 ENCOUNTER — Ambulatory Visit: Admitting: Genetic Counselor

## 2023-09-26 ENCOUNTER — Ambulatory Visit: Payer: Self-pay | Admitting: Cardiology

## 2023-09-28 ENCOUNTER — Ambulatory Visit (INDEPENDENT_AMBULATORY_CARE_PROVIDER_SITE_OTHER): Admitting: Podiatry

## 2023-09-28 DIAGNOSIS — M216X1 Other acquired deformities of right foot: Secondary | ICD-10-CM | POA: Diagnosis not present

## 2023-09-28 DIAGNOSIS — M216X2 Other acquired deformities of left foot: Secondary | ICD-10-CM

## 2023-09-28 NOTE — Patient Instructions (Signed)

## 2023-09-28 NOTE — Progress Notes (Signed)
" °  °  Subjective:  Patient ID: Kendra Griffith, female    DOB: 04-Nov-1965,  MRN: [id number]  Chief Complaint  Patient presents with   Callouses    RM#13 Bilateral calluses causing pain and discomfort difficult to walk on them.    Discussed the use of AI scribe software for clinical note transcription with the patient, who gave verbal consent to proceed.  History of Present Illness Kendra Griffith is a 58 year old female with diabetes and heart failure who presents with painful foot calluses.  She has had painful calluses on her feet for over 21 years, with previous surgery providing partial relief. There are no wounds, bleeding, or drainage from the calluses. She uses urea cream for moisture and wears shoe inserts. She is awaiting diabetic shoes due to a mix-up requiring a signature.  She manages diabetes with Farxiga  and does not monitor blood sugar at home, relying on medical visits for checks. No current foot wounds or ulcers are present, but she experiences balance issues.  She has nerve issues in her feet and has considered treatments like electrodes and acupuncture for relief. She uses corn removal products cautiously to avoid infections and is considering pedicures but is cautious about the procedure.      Objective:    Physical Exam  General: AAO x3, NAD  Dermatological: Hyperkeratotic lesions noted submetatarsal 2 and 5 bilaterally without any underlying ulceration, drainage or signs of infection.  There are no open lesions.  Vascular: Dorsalis Pedis artery and Posterior Tibial artery pedal pulses are 2/4 bilateral with immedate capillary fill time.  There is no pain with calf compression, swelling, warmth, erythema.   Neruologic: Grossly intact via light touch bilateral.   Musculoskeletal: Prominence of metatarsal heads plantarly with atrophy of the fat pad.  Gait: Unassisted, Nonantalgic.     No images are attached to the encounter.    Results    Assessment:    1. Plantar flexed metatarsal, left   2. Plantar flexed metatarsal bone of right foot      Plan:  Patient was evaluated and treated and all questions answered.  Assessment and Plan Assessment & Plan Painful foot calluses Chronic calluses due to pressure and gait. Partial relief from previous surgery. Awaiting diabetic shoes for support.  Discussed insurance does not cover routine trimming. - As a courtesy I debrided the calluses and complications of bleeding.  Awaiting diabetic shoes for offloading which I think would be beneficial. - Recommend continued use of moisturizer. - Encourage use of diabetic shoes once received.  Diabetes mellitus Diabetes managed with Farxiga .       Return in about 9 weeks (around 11/30/2023) for calluses .    "

## 2023-10-02 ENCOUNTER — Ambulatory Visit: Attending: Genetic Counselor | Admitting: Genetic Counselor

## 2023-10-06 ENCOUNTER — Other Ambulatory Visit (HOSPITAL_COMMUNITY): Payer: Self-pay | Admitting: Cardiology

## 2023-10-06 ENCOUNTER — Other Ambulatory Visit: Payer: Self-pay

## 2023-10-09 NOTE — Progress Notes (Signed)
 Post Test Genetic Consult  Referral Reason  Devonda Pequignot was referred for a post-test genetic consult of her genetic test of the Cardiomyopathy Comprehensive panel that detected variants of unknown significance in ALPK3 and SCN5A genes.  Personal Medical Information   Kendra Griffith (III.4 on pedigree) is a pleasant 58 y.o. African American woman who was recently found to have heart failure when she was admitted in May 2024 with EF<20%. She reports feeling much better today compared to last year.  Traditional Risk Factors Braxton denies having myocarditis, ischemic heart disease, infiltrative myocardial disease (amyloidosis, sarcoidosis, hemochromatosis), infection with HIV virus, connective tissue disease (such as systemic lupus erythematosus etc.), doxorubicin therapy and heart valve disease. Does report having HTN.  She denies drug abuse but tells me of chronic alcohol  abuse starting at age 40, when her mother passed away, until age 53 when she quit in 2013.    Family History  Relation to Proband Pedigree # Current age CVD/Age of onset Notes  Sons, 2 IV.7, IV.8 64, 20 None   Daughter IV.9 35 None   Grandkids V.1-V.6 20-9 None   Sisters, 3 III.1-III.3 65, 63, 60 None III.2- w/ asthma  Nephews, nieces IV.1-IV.6 40s-23 None         Father II.1 Deceased None Died @ 6- cancer Not in touch w/ paternal relatives  Mother II.2 Deceased M.I- 3x Died suddenly while playing bingo @ 81. Death certificate mentions "something "ischemic".    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 This note also includes the re-interpretation of his genetic test result.  The variants were re-interpreted to verify the test report interpretation and to compile the information in light of the patient's clinical presentation and family history  ALPK3 gene c.1621C>T, p.Arg541Cys; +/- The ALPK3 gene encodes,  alpha protein kinase 3, that is considered an essential cardiac pseudo kinase (does not have catalytic activity) localized in the nuclear envelop and sarcomere M- band and aids myosin-mediated force buffering and sarcomeric proteostasis.  Homozygous loss of function or deleterious missense mutations are associated with cardiomyopathy some detected as early as during late gestation with biventricular dilatation and reduced contractility observed in early infancy. Infants with such biallelic ALPK3 gene variants have variable phenotypes with some presenting with DCM that progresses to HCM over time. They have rapidly worsening disease with hemodynamic compensation eventually resulting in death in childhood.  Heterozygous loss of function ALPK3 gene variants that includes frameshift, splice site and nonsense mutations, have been linked to approx. 1.5% to 2.5% of unexplained ventricular hypertrophy in adults (mean age 71, +/- 15.9 years). These monoallelic variants display age-dependent penetrance (95% to 80%, respectively in males and females by age 9) and variable expression with parents of infants with autosomal recessive ALPK3 mutations not exhibiting clinical signs of cardiac disease. Most patients present with either apical or concentric patterns of hypertrophy.  The ALPK3 c.1621C>T, p.Arg541Cys variant is a heterozygous missense variant that has not yet been reported in patients with cardiomyopathy. In light of the available knowledgebase for heterozygous ALPK3 gene variants, missense variants are not linked to cardiomyopathy.  Thus, in the absence of functional data or family members with NICM to demonstrate segregation with disease, the ALPK3 c.1621C>T, p.Arg541Cys variant is a variant of unknown clinical significance.  It is likely that this is a rare benign variant.  SCN5A gene c.1490C>G, p.Ser497Cys; +/- The SCN5A gene encodes the alpha subunit of the cardiac sodium channel, Nav1.5  that is  responsible for the initiation and propagation of action potentials and subsequent cardiac excitability and conduction of electrical stimuli in the heart. Gain of function mutations in SCN5A gene causes long QT syndrome as there is an increased sodium ion influx into the cardiac cell, while loss of function mutations in this gene lead to Brugada syndrome. In addition, dilated cardiomyopathy has been associated with both loss of function and gain of function SCN5A gene mutations.  The SNC5A c.1490C>G, p.Ser497Cys variant has not been reported in patients with cardiomyopathy or channelopathies. It is a very rare variant, with an allele frequency of 6.5x10-6 in the general population. However, computational algorithms do not rank this variant highly as having a deleterious effect on protein function. This in the absence of additional functional data and lack of patients with this variant, the SCN5A c.1490C>G, p.Ser497Cys variant is a variant of unknown clinical significance.  It is likely that this is a rare benign variant.  Impression and Plan Therefore, as both ALPK3 and SCN5A variants are interpreted as variants of unknown clinical significance and considering her personal history of long-standing alcohol  abuse, it is highly likely that her condition stems from the latter and the reported gene variants are not causal to her condition. As such, further testing of her family members for this variant is unwarranted.  Danford Pac, Ph.D, [facility] Clinical Molecular Geneticist

## 2023-10-16 ENCOUNTER — Other Ambulatory Visit (HOSPITAL_COMMUNITY): Payer: Self-pay | Admitting: Pharmacist

## 2023-10-16 ENCOUNTER — Other Ambulatory Visit: Payer: Self-pay

## 2023-10-16 ENCOUNTER — Ambulatory Visit: Payer: Self-pay | Admitting: Nurse Practitioner

## 2023-10-16 ENCOUNTER — Other Ambulatory Visit (HOSPITAL_COMMUNITY): Payer: Self-pay

## 2023-10-16 MED ORDER — DAPAGLIFLOZIN PROPANEDIOL 10 MG PO TABS
10.0000 mg | ORAL_TABLET | Freq: Every day | ORAL | 11 refills | Status: AC
  Filled 2023-10-16: qty 30, 30d supply, fill #0
  Filled 2023-11-08: qty 30, 30d supply, fill #1
  Filled 2023-12-22 (×2): qty 30, 30d supply, fill #2
  Filled 2024-01-25: qty 30, 30d supply, fill #0
  Filled 2024-03-04: qty 30, 30d supply, fill #1
  Filled 2024-04-08: qty 30, 30d supply, fill #2
  Filled 2024-05-13 (×2): qty 30, 30d supply, fill #3

## 2023-10-17 ENCOUNTER — Other Ambulatory Visit: Payer: Self-pay

## 2023-10-30 ENCOUNTER — Telehealth (HOSPITAL_COMMUNITY): Payer: Self-pay | Admitting: *Deleted

## 2023-10-30 NOTE — Telephone Encounter (Signed)
 Called to confirm/remind patient of their appointment at the [facility] on 10/31/23.     Appointment:              [] Confirmed             [x] Left mess              [] No answer/No voice mail             [] Phone not in service   Patient reminded to bring all medications and/or complete list.   Confirmed patient has transportation. Gave directions, instructed to utilize valet parking.

## 2023-10-30 NOTE — Progress Notes (Signed)
 [facility] Note    PCP: Theotis Haze ORN, NP HF Cardiologist: Dr. Rolan  58 y.o. AAF with past medical history of HTN, DM, tobacco use, anxiety, bipolar disorder, a remote history of ETOH abuse, and diagnosis of systolic heart failure and LE DVT in 5/24.   Admitted 5/24 with new acute systolic heart failure. Echo showed EF <20%, GIIIDD, RV mod reduced, mod elevated PASP ~58, LA mod dilated, RA mildly dilated, mild MR, mod TR. Hs trops negative. She was diuresed w/ IV Lasix  and started on GDMT. Heparin  started for LE DVT. V/Q scan showed no perfusion defects to suggest PE. Hypercoagulable workup was negative. R/LHC showed normal coronary arteries, elevated filling pressures with low cardiac output (RA 6, PA 47/24/33, PA sat 57%, Fick CI 2.1). She was diuresed further w/ IV Lasix  and transitioned to PO torsemide . GDMT further titrated. Switched to Eliquis  after completion of cath.   Cardiac MRI in 8/24 showed LV EF 25%, RV EF 36%, no delayed enhancement.    Echo 9/24 EF 25% with septal-lateral dyssynchrony, mild RV dysfunction, and IVC normal.   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   Medtronic CRT-D device placed in 12/24.   Echo 5/25 showed EF 35-40%, concern for possible vegetation on TR or device. She underwent TEE 6/25 showing EF 35-40%, normal RV, no clot or vegetation  Today she returns for HF follow up. Overall feeling fine. She has SOB walking further distances on flat ground. Has SOB with housework. Rare palpitations. She has itching and discomfort around CRT insertion site. Denies abnormal bleeding, CP, dizziness, edema, or PND/Orthopnea. Appetite ok. Weight at home 159 pounds. She does not know what medications she is taking. Smoking 1 ppd.  Medtronic device interrogation (personally reviewed): OptiVol ok, 4.7 hr/day activity, no AT/AF, 92.5 % VP  ECG (personally reviewed):  none ordered today.  Labs  (7/24): K 4.6, creatinine 1.07 => 1.09, BNP 77 Labs (9/24): K 4.0, creatinine 1.10, BNP 50   Labs (11/24): K 4.1, creatinine 1.01 Labs (5/25): K 3.8, creatinine 1.25  PMH: 1. DVT: DVT in 1980.  Left lower leg DVT in 5/24.  - Prothrombin gene mutation negative, factor V Leiden negative, lupus anticoagulant negative 2. Type 2 diabetes 3. GERD 4. HTN 5. Sickle cell trait 6. Active smoker 7. Prior ETOH abuse.  8. Bipolar disorder 9. Chronic systolic CHF: Nonischemic cardiomyopathy.  MDT CRT-D device.  Diagnosis by echo in 5/24 showing EF < 20%, grade III DD, RV moderately reduced, RVSP 58 mmHg, mild MR, moderate TR.  - R/LHC (5/24): normal coronaries; RA 6, PA 47/24 (33), PCWP 23, CO/CI (Fick) 3.4/2.1, PVR 2.6 WU.  - Cardiac MRI (8/24): LVEF 25%, RVEF 36%, no delayed enhancement.   - Echo (9/24): EF 25% with septal-lateral dyssynchrony, mild RV dysfunction, and IVC normal.  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  - Echo 5/25: EF 35-40%, concern for possible vegetation on TR or device.  - TEE (6/25): EF 35-40%, normal RV, no clot or vegetation  FH: Mother with CHF, ? History of rheumatic heart disease.  Sister with CHF.   Current Outpatient Medications  Medication Sig Dispense Refill   acetaminophen -codeine  (TYLENOL  #3) 300-30 MG tablet Take 1-2 tablets by mouth every 4 (four) hours as needed for moderate pain (pain score 4-6). 60 tablet 0   albuterol  (VENTOLIN  HFA) 108 (90 Base) MCG/ACT inhaler Inhale 2 puffs into the lungs  every 6 (six) hours as needed for wheezing or shortness of breath. 18 g 0   amitriptyline  (ELAVIL ) 75 MG tablet Take 1 tablet (75 mg total) by mouth at bedtime for pain and depression (Patient taking differently: Take 75 mg by mouth at bedtime as needed for sleep.) 90 tablet 1   apixaban  (ELIQUIS ) 5 MG TABS tablet Take 1 tablet (5 mg total) by mouth 2 (two) times daily. 60 tablet 6   BIOTIN 5000 PO Take 1 tablet by  mouth daily.     bisoprolol  (ZEBETA ) 5 MG tablet Take 0.5 tablets (2.5 mg total) by mouth daily. 15 tablet 11   cholecalciferol (VITAMIN D3) 25 MCG (1000 UT) tablet Take 1,000 Units by mouth daily.     dapagliflozin  propanediol (FARXIGA ) 10 MG TABS tablet Take 1 tablet (10 mg total) by mouth daily. 30 tablet 11   famotidine  (PEPCID ) 20 MG tablet Take 1 tablet (20 mg total) by mouth 2 (two) times daily for heartburn 180 tablet 1   hydrOXYzine  (ATARAX ) 50 MG tablet Take 1 tablet (50 mg total) by mouth 3 (three) times daily as needed. Please deliver 90 tablet 2   ibuprofen  (ADVIL ) 200 MG tablet Take 200-800 mg by mouth every 6 (six) hours as needed for moderate pain (pain score 4-6).     losartan  (COZAAR ) 25 MG tablet Take 0.5 tablets (12.5 mg total) by mouth at bedtime. 45 tablet 3   Menthol, Topical Analgesic, (BIOFREEZE EX) Apply 1 spray topically daily as needed (foot pain). Menthol     Multiple Vitamins-Minerals (MULTIVITAMIN WITH MINERALS) tablet Take 1 tablet by mouth daily.     potassium chloride  SA (KLOR-CON  M) 20 MEQ tablet Take 2 tablets (40 mEq total) by mouth daily. 60 tablet 6   rosuvastatin  (CRESTOR ) 5 MG tablet Take 1 tablet (5 mg total) by mouth daily for cholesterol 90 tablet 0   spironolactone  (ALDACTONE ) 25 MG tablet Take 1 tablet (25 mg total) by mouth daily. 90 tablet 3   tetrahydrozoline 0.05 % ophthalmic solution Place 1 drop into both eyes daily as needed (itching /red eyes).     torsemide  (DEMADEX ) 20 MG tablet Take 2 tablets (40 mg total) by mouth daily. 60 tablet 6   trolamine salicylate (ASPERCREME) 10 % cream Apply 1 Application topically daily as needed (Foot pain). With lidocaine      urea (CARMOL) 40 % CREA Apply 1 application  topically daily as needed (Foot pain).     vitamin B-12 (CYANOCOBALAMIN ) 500 MCG tablet Take 500 mcg by mouth daily as needed (Supplement).     gabapentin  (NEURONTIN ) 300 MG capsule Take 1 capsule (300 mg total) by mouth 3 (three) times daily as  needed (Pain). (Patient not taking: Reported on 10/31/2023) 90 capsule 1   nicotine  (NICODERM CQ  - DOSED IN MG/24 HR) 7 mg/24hr patch Place 1 patch (7 mg total) onto the skin daily. (Patient not taking: Reported on 10/31/2023) 14 patch 0   potassium chloride  SA (KLOR-CON  M) 20 MEQ tablet Take 20 mEq by mouth daily. (Patient not taking: Reported on 10/31/2023)     No current facility-administered medications for this encounter.   Allergies  Allergen Reactions   Amoxicillin     Pain   Darvocet [Propoxyphene N-Acetaminophen ] Nausea And Vomiting    Makes her jittery   Hydrocodone-Acetaminophen  Nausea And Vomiting    Upset stomach   Percocet [Oxycodone -Acetaminophen ] Other (See Comments)    Jittery   Valium [Diazepam] Other (See Comments)    Makes pt. Feel  out of wack   Social History   Socioeconomic History   Marital status: Single    Spouse name: Not on file   Number of children: 3   Years of education: 8-9   Highest education level: 8th grade  Occupational History   Occupation: applying for disability  Tobacco Use   Smoking status: Every Day    Current packs/day: 2.00    Average packs/day: 2.0 packs/day for 29.0 years (58.0 ttl pk-yrs)    Types: Cigarettes   Smokeless tobacco: Never  Vaping Use   Vaping status: Never Used  Substance and Sexual Activity   Alcohol  use: No    Alcohol /week: 0.0 standard drinks of alcohol     Comment: Stopped drinking 2013   Drug use: No   Sexual activity: Not Currently    Birth control/protection: None  Other Topics Concern   Not on file  Social History Narrative   Patient lives alone in an apartment on the first floor.  3 children.  Applying for disability.  Education: 8th or 9th grade.   Currently not working   Trying to go back to school online for [employer]   Six Grandchildren   Social Drivers of Health   Financial Resource Strain: Medium Risk (09/02/2022)   Overall Financial Resource Strain (CARDIA)    Difficulty of Paying Living  Expenses: Somewhat hard  Food Insecurity: Food Insecurity Present (10/26/2022)   Hunger Vital Sign    Worried About Running Out of Food in the Last Year: Sometimes true    Ran Out of Food in the Last Year: Sometimes true  Transportation Needs: Unmet Transportation Needs (11/25/2022)   PRAPARE - Administrator, Civil Service (Medical): No    Lack of Transportation (Non-Medical): Yes  Physical Activity: Insufficiently Active (09/02/2022)   Exercise Vital Sign    Days of Exercise per Week: 1 day    Minutes of Exercise per Session: 30 min  Stress: Stress Concern Present (09/02/2022)   [employer] of Occupational Health - Occupational Stress Questionnaire    Feeling of Stress : To some extent  Social Connections: Moderately Isolated (09/02/2022)   Social Connection and Isolation Panel    Frequency of Communication with Friends and Family: Three times a week    Frequency of Social Gatherings with Friends and Family: Once a week    Attends Religious Services: Never    Database administrator or Organizations: Yes    Attends Banker Meetings: 1 to 4 times per year    Marital Status: Divorced  Intimate Partner Violence: Unknown (06/08/2017)   Humiliation, Afraid, Rape, and Kick questionnaire    Fear of Current or Ex-Partner: Patient declined    Emotionally Abused: Patient declined    Physically Abused: Patient declined    Sexually Abused: Patient declined   Wt Readings from Last 3 Encounters:  10/31/23 72.5 kg (159 lb 12.8 oz)  08/11/23 72.6 kg (160 lb 0.9 oz)  08/01/23 72.6 kg (160 lb)   BP 110/80   Pulse 60   Wt 72.5 kg (159 lb 12.8 oz)   LMP 03/12/2019 (Approximate)   SpO2 100%   BMI 26.59 kg/m   PHYSICAL EXAM: General:  NAD. No resp difficulty, walked into clinic HEENT: Normal Neck: Supple. No JVD. Cor: Regular rate & rhythm. No rubs, gallops or murmurs. Lungs: Clear, diminished in bases. Abdomen: Soft, nontender, nondistended.  Extremities: No  cyanosis, clubbing, rash, edema Neuro: Alert & oriented x 3, moves all 4 extremities w/o difficulty.  Affect pleasant.  ASSESSMENT & PLAN: 1. Chronic Systolic CHF:  Nonischemic cardiomyopathy.  Medtronic CRT-D device.  Echo 5/24 with EF <20%, GIIIDD, RV mod reduced, mod elevated PASP ~58, LA mod dilated, RA mildly dilated, mild MR, mod TR. NICM. R/LHC showed normal coronaries, elevated filling pressures and low output, CI 2.1.  Cardiac MRI in 8/24 showed no delayed enhancement.  Echo was done 9/24, EF 25% with septal-lateral dyssynchrony, mild RV dysfunction, and IVC normal.  Etiology for cardiomyopathy uncertain. Previous history of heavy alcohol  use but quit a number of years ago. HIV negative.  No viral-type illness prior to diagnosis. She has a FH of CHF (sister and mother). 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 Echo 5/25 showed EF 35-40%. NYHA class IIb-early III, suspect smoking contributing to some of her dyspnea. She is not volume overloaded by exam or Optivol.  - Continue torsemide  20 mg daily. BMET/BNP today.  - Continue spironolactone  25 mg daily.  - Continue Farxiga  10 mg daily. - Continue bisoprolol  2.5 mg at bedtime.  - Continue losartan  12.5 mg at night (hypotension on Entresto ) - She has seen Dr. Fairy, for genetic counseling. 2. HTN: BP stable.  3. DMII: Continue SGLT2i. 4. Tobacco Abuse: Smoking 1 ppd - Discussed cessation. She has nicotine  patches. 5. DVT: Left leg DVT found 5/24.  Had DVT back in 1980s after she delivered her son. Hypercoagulable workup negative.  - Long-term Eliquis  given unprovoked DVT.   Follow up in 3-4 months with Dr. Rolan. We will call her this afternoon when she gets back home to complete her medication rec.   Kendra HERO Hollenberg, FNP 10/31/23

## 2023-10-31 ENCOUNTER — Encounter (HOSPITAL_COMMUNITY): Payer: Self-pay

## 2023-10-31 ENCOUNTER — Ambulatory Visit (HOSPITAL_COMMUNITY)
Admission: RE | Admit: 2023-10-31 | Discharge: 2023-10-31 | Disposition: A | Discharge status: Home / Self Care | Source: Ambulatory Visit | Attending: Family Medicine | Admitting: Family Medicine

## 2023-10-31 ENCOUNTER — Ambulatory Visit (HOSPITAL_COMMUNITY): Payer: Self-pay | Admitting: Family Medicine

## 2023-10-31 VITALS — BP 110/80 | HR 60 | Wt 159.8 lb

## 2023-10-31 DIAGNOSIS — I10 Essential (primary) hypertension: Secondary | ICD-10-CM | POA: Diagnosis not present

## 2023-10-31 DIAGNOSIS — F17210 Nicotine dependence, cigarettes, uncomplicated: Secondary | ICD-10-CM | POA: Insufficient documentation

## 2023-10-31 DIAGNOSIS — E119 Type 2 diabetes mellitus without complications: Secondary | ICD-10-CM | POA: Diagnosis not present

## 2023-10-31 DIAGNOSIS — Z7984 Long term (current) use of oral hypoglycemic drugs: Secondary | ICD-10-CM | POA: Insufficient documentation

## 2023-10-31 DIAGNOSIS — Z8249 Family history of ischemic heart disease and other diseases of the circulatory system: Secondary | ICD-10-CM | POA: Insufficient documentation

## 2023-10-31 DIAGNOSIS — Z720 Tobacco use: Secondary | ICD-10-CM | POA: Diagnosis not present

## 2023-10-31 DIAGNOSIS — Z7901 Long term (current) use of anticoagulants: Secondary | ICD-10-CM | POA: Insufficient documentation

## 2023-10-31 DIAGNOSIS — I5022 Chronic systolic (congestive) heart failure: Secondary | ICD-10-CM | POA: Diagnosis present

## 2023-10-31 DIAGNOSIS — F319 Bipolar disorder, unspecified: Secondary | ICD-10-CM | POA: Diagnosis not present

## 2023-10-31 DIAGNOSIS — I081 Rheumatic disorders of both mitral and tricuspid valves: Secondary | ICD-10-CM | POA: Insufficient documentation

## 2023-10-31 DIAGNOSIS — Z86718 Personal history of other venous thrombosis and embolism: Secondary | ICD-10-CM | POA: Insufficient documentation

## 2023-10-31 DIAGNOSIS — I110 Hypertensive heart disease with heart failure: Secondary | ICD-10-CM | POA: Insufficient documentation

## 2023-10-31 DIAGNOSIS — I428 Other cardiomyopathies: Secondary | ICD-10-CM | POA: Diagnosis not present

## 2023-10-31 DIAGNOSIS — Z79899 Other long term (current) drug therapy: Secondary | ICD-10-CM | POA: Insufficient documentation

## 2023-10-31 DIAGNOSIS — I82532 Chronic embolism and thrombosis of left popliteal vein: Secondary | ICD-10-CM

## 2023-10-31 LAB — BASIC METABOLIC PANEL WITH GFR
Anion gap: 11 (ref 5–15)
BUN: 12 mg/dL (ref 6–20)
CO2: 28 mmol/L (ref 22–32)
Calcium: 9.9 mg/dL (ref 8.9–10.3)
Chloride: 102 mmol/L (ref 98–111)
Creatinine, Ser: 1.04 mg/dL — ABNORMAL HIGH (ref 0.44–1.00)
GFR, Estimated: >60 mL/min (ref >60)
Glucose, Bld: 110 mg/dL — ABNORMAL HIGH (ref 70–99)
Potassium: 4.1 mmol/L (ref 3.5–5.1)
Sodium: 141 mmol/L (ref 135–145)

## 2023-10-31 NOTE — Patient Instructions (Addendum)
 Good  to see you today!   Labs done today, your results will be available in MyChart, we will contact you for abnormal readings.  Your physician recommends that you schedule a follow-up appointment 4 months(November) Call office  in September to schedule an appointment  Please weigh daily and record  Do the following things EVERYDAY: Weigh yourself in the morning before breakfast. Write it down and keep it in a log. Take your medicines as prescribed Eat low salt foods--Limit salt (sodium) to 2000 mg per day.  Stay as active as you can everyday Limit all fluids for the day to less than 2 liters  If you have any questions or concerns before your next appointment please send us  a message through [place] or call our office at [phone].    TO LEAVE A MESSAGE FOR THE NURSE SELECT OPTION 2, PLEASE LEAVE A MESSAGE INCLUDING: YOUR NAME DATE OF BIRTH CALL BACK NUMBER REASON FOR CALL**this is important as we prioritize the call backs  YOU WILL RECEIVE A CALL BACK THE SAME DAY AS LONG AS YOU CALL BEFORE 4:00 PM At the [facility], you and your health needs are our priority. As part of our continuing mission to provide you with exceptional heart care, we have created designated Provider Care Teams. These Care Teams include your primary Cardiologist (physician) and Advanced Practice Providers (APPs- Physician Assistants and Nurse Practitioners) who all work together to provide you with the care you need, when you need it.   You may see any of the following providers on your designated Care Team at your next follow up: Dr Toribio Fuel Dr Ezra Shuck Dr. Ria Commander Dr. Morene Brownie Amy Lenetta, NP Caffie Shed, [place] [facility] [place], [place] Beckey Coe, NP [place] Lee, NP Ellouise Class, NP Tinnie Redman, PharmD Jaun Bash, PharmD   Please be sure to bring in all your medications bottles to every appointment.    Thank you for choosing [facility]

## 2023-10-31 NOTE — Addendum Note (Signed)
 Encounter addended by: Darlis Wragg M, RN on: 10/31/2023 1:48 PM  Actions taken: Order list changed, Medication List reviewed

## 2023-11-02 ENCOUNTER — Telehealth (HOSPITAL_COMMUNITY): Payer: Self-pay

## 2023-11-02 ENCOUNTER — Other Ambulatory Visit: Payer: Self-pay | Admitting: Nurse Practitioner

## 2023-11-02 DIAGNOSIS — Z1231 Encounter for screening mammogram for malignant neoplasm of breast: Secondary | ICD-10-CM

## 2023-11-02 NOTE — Telephone Encounter (Signed)
 Received VM from patient stating that she would like to speak with nurse about medications from 7/22 visit   Patient states that she thought that she was told to increase spironolactone  to (2) tablets daily- advised patient that chart review it states 25mg  (1) tablet daily. Patient reports she would like to check with Harlene, NP about this. Would also like to verify with jess the dose of torsemide  and potassium she should remain on.   Advised I would forward message to her.   Patient would also like note stating that she needs to utilize handicap parking her her apartment complex--- will check with provider if okay to provide letter.

## 2023-11-03 ENCOUNTER — Encounter (HOSPITAL_COMMUNITY): Payer: Self-pay | Admitting: Family Medicine

## 2023-11-03 NOTE — Telephone Encounter (Signed)
 [place], Kendra Griffith HERO, FNP to Me (Selected Message)    11/03/23  8:02 AM She did not know what meds she was taking when I saw her. Keene called her later that day and verified her meds. She should be taking 1 tab of spiro (25 mg daily) and torsemide  20 mg daily + 20 KCL daily. OK to provide letter for handicap placard at apt complex   Called and spoke with patient and made her aware of the above. Advised her that letter has been sent to her MyChart for her handicap parking.   Advised patient to call back to office with any issues, questions, or concerns. Patient verbalized understanding.

## 2023-11-08 ENCOUNTER — Other Ambulatory Visit (HOSPITAL_COMMUNITY): Payer: Self-pay | Admitting: Family Medicine

## 2023-11-08 ENCOUNTER — Other Ambulatory Visit: Payer: Self-pay

## 2023-11-09 NOTE — Progress Notes (Signed)
 Remote ICD transmission.

## 2023-11-10 ENCOUNTER — Telehealth: Payer: Self-pay | Admitting: Nurse Practitioner

## 2023-11-10 NOTE — Telephone Encounter (Signed)
 Called patient, no answer. Left voicemail confirming upcoming appointment on 11/13/2023 at 11:10 am. Provided callback number for any questions or changes.

## 2023-11-13 ENCOUNTER — Ambulatory Visit: Admitting: Nurse Practitioner

## 2023-11-13 ENCOUNTER — Telehealth: Admitting: Nurse Practitioner

## 2023-11-13 ENCOUNTER — Ambulatory Visit: Payer: Self-pay | Admitting: *Deleted

## 2023-11-13 ENCOUNTER — Telehealth: Payer: Self-pay | Admitting: Nurse Practitioner

## 2023-11-13 NOTE — Telephone Encounter (Signed)
 Transportation Voucher completed.   Pick up date and time: 12/13/2023 2:00 pm.  Pick up address: [address] [place] [address]   Drop off address: [address] [address]

## 2023-11-13 NOTE — Telephone Encounter (Signed)
 Patient scheduled an appointment for today with PCP.

## 2023-11-13 NOTE — Telephone Encounter (Signed)
 FYI Only or Action Required?: Action required by provider: request for appointment and to change appt to [facility] due to transportation issues.  Patient was last seen in primary care on 05/09/2023 by Newlin, Enobong, MD.  Called Nurse Triage reporting Advice Only.  Symptoms began see previous f//u for foot pain .  Interventions attempted: Other: na.  Symptoms are: unchanged.  Triage Disposition: Call PCP When Office is Open  Patient/caregiver understands and will follow disposition?: Yes              Copied from CRM #[id number]. Topic: Clinical - Red Word Triage >> Nov 13, 2023 11:01 AM Kendra Griffith wrote: [facility] that prompted transfer to Nurse Triage: Patient looking to reschedule appointment today for foot pain, unable to get ride with medical transportation. Reason for Disposition  [1] Caller requesting NON-URGENT health information AND [2] PCP's office is the best resource  Answer Assessment - Initial Assessment Questions 1. REASON FOR CALL: What is the main reason for your call? or How can I best help you?     Patient calling to cancel appt due to no transportation.  2. SYMPTOMS : Do you have any symptoms?      Foot pain and requesting referral  3. OTHER QUESTIONS: Do you have any other questions?     Can appt be changed to [facility]?  Protocols used: Information Only Call - No Triage-A-AH

## 2023-11-14 LAB — ECHO TEE

## 2023-11-16 ENCOUNTER — Encounter: Payer: Self-pay | Admitting: Cardiology

## 2023-11-21 ENCOUNTER — Encounter: Attending: Psychology | Admitting: Psychology

## 2023-11-27 ENCOUNTER — Ambulatory Visit: Admitting: Nurse Practitioner

## 2023-11-28 ENCOUNTER — Ambulatory Visit
Admission: RE | Admit: 2023-11-28 | Discharge: 2023-11-28 | Disposition: A | Discharge status: Home / Self Care | Source: Ambulatory Visit | Attending: Nurse Practitioner | Admitting: Nurse Practitioner

## 2023-11-28 DIAGNOSIS — Z1231 Encounter for screening mammogram for malignant neoplasm of breast: Secondary | ICD-10-CM

## 2023-11-30 ENCOUNTER — Ambulatory Visit: Payer: Self-pay | Admitting: Nurse Practitioner

## 2023-12-06 ENCOUNTER — Other Ambulatory Visit (HOSPITAL_COMMUNITY): Payer: Self-pay

## 2023-12-12 ENCOUNTER — Telehealth: Payer: Self-pay | Admitting: Nurse Practitioner

## 2023-12-12 NOTE — Telephone Encounter (Signed)
 The Patient was informed that transportation has been arranged, and the cab is scheduled to pick her up at 2:00 pm tomorrow.

## 2023-12-12 NOTE — Telephone Encounter (Signed)
Pt confirmed appt 9/2

## 2023-12-12 NOTE — Telephone Encounter (Signed)
 Copied from CRM [id number]. Topic: General - Transportation >> Dec 12, 2023 11:31 AM [name] wrote:  Reason for CRM: Pt is checking with office regarding her transportation for appt on 12/13/2023 at 2:50 p.m. for pick up and return home. Please call pt at [phone].

## 2023-12-13 ENCOUNTER — Other Ambulatory Visit (HOSPITAL_COMMUNITY): Payer: Self-pay

## 2023-12-13 ENCOUNTER — Encounter: Payer: Self-pay | Admitting: Nurse Practitioner

## 2023-12-13 ENCOUNTER — Ambulatory Visit: Attending: Nurse Practitioner | Admitting: Nurse Practitioner

## 2023-12-13 ENCOUNTER — Other Ambulatory Visit: Payer: Self-pay

## 2023-12-13 VITALS — BP 112/69 | HR 60 | Resp 18 | Ht 65.0 in | Wt 162.0 lb

## 2023-12-13 DIAGNOSIS — F32A Depression, unspecified: Secondary | ICD-10-CM

## 2023-12-13 DIAGNOSIS — Z23 Encounter for immunization: Secondary | ICD-10-CM

## 2023-12-13 DIAGNOSIS — I10 Essential (primary) hypertension: Secondary | ICD-10-CM

## 2023-12-13 DIAGNOSIS — F419 Anxiety disorder, unspecified: Secondary | ICD-10-CM

## 2023-12-13 DIAGNOSIS — E785 Hyperlipidemia, unspecified: Secondary | ICD-10-CM | POA: Diagnosis not present

## 2023-12-13 DIAGNOSIS — F17200 Nicotine dependence, unspecified, uncomplicated: Secondary | ICD-10-CM | POA: Diagnosis not present

## 2023-12-13 DIAGNOSIS — Z79899 Other long term (current) drug therapy: Secondary | ICD-10-CM

## 2023-12-13 DIAGNOSIS — E1142 Type 2 diabetes mellitus with diabetic polyneuropathy: Secondary | ICD-10-CM | POA: Diagnosis not present

## 2023-12-13 DIAGNOSIS — G894 Chronic pain syndrome: Secondary | ICD-10-CM

## 2023-12-13 DIAGNOSIS — F5101 Primary insomnia: Secondary | ICD-10-CM

## 2023-12-13 DIAGNOSIS — T148XXA Other injury of unspecified body region, initial encounter: Secondary | ICD-10-CM

## 2023-12-13 MED ORDER — ACETAMINOPHEN-CODEINE 300-30 MG PO TABS
1.0000 | ORAL_TABLET | ORAL | 0 refills | Status: AC | PRN
  Filled 2023-12-13: qty 60, 5d supply, fill #0

## 2023-12-13 MED ORDER — GABAPENTIN 300 MG PO CAPS
300.0000 mg | ORAL_CAPSULE | Freq: Three times a day (TID) | ORAL | 3 refills | Status: DC | PRN
  Filled 2023-12-13: qty 90, 30d supply, fill #0
  Filled 2024-01-25: qty 90, 30d supply, fill #1
  Filled 2024-03-14: qty 90, 30d supply, fill #2
  Filled 2024-04-16: qty 90, 30d supply, fill #3

## 2023-12-13 MED ORDER — TRAZODONE HCL 150 MG PO TABS
150.0000 mg | ORAL_TABLET | Freq: Every day | ORAL | 0 refills | Status: AC
  Filled 2023-12-13 (×2): qty 180, 90d supply, fill #0

## 2023-12-13 NOTE — Progress Notes (Signed)
 Assessment & Plan:  [name] was seen today for insomnia and hypertension.  Diagnoses and all orders for this visit:  Primary hypertension Continue all antihypertensives as prescribed.  Reminded to bring in blood pressure log for follow  up appointment.  RECOMMENDATIONS: DASH/Mediterranean Diets are healthier choices for HTN.    Type 2 diabetes mellitus with diabetic polyneuropathy, without long-term current use of insulin   Currently diet controlled with A1c at goal -     Urine Albumin /Creatinine with ratio (send out) [LAB689] -     Hemoglobin A1c  Dyslipidemia, goal LDL below 100 -     Lipid panel  Need for influenza vaccination -     Flu vaccine trivalent PF, 6mos and older(Flulaval,Afluria,Fluarix,Fluzone)  Tobacco dependence -     CT CHEST LUNG CA SCREEN LOW DOSE W/O CM; Future  Primary insomnia -     traZODone  (DESYREL ) 150 MG tablet; Take 1-2 tablets (150-300 mg total) by mouth at bedtime. 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 Previous use of amitriptyline  for pain and depression, not a sleep aid. Ambien  and Belsomra not preferred due to [facility] regulations. - Prescribed trazodone  for sleep aid.  Bruising -     CBC with Differential Increased bruising likely due to minor trauma and aging-related skin changes. - Ordered CBC to check platelet count and clotting times.   Chronic pain syndrome -     gabapentin  (NEURONTIN ) 300 MG capsule; Take 1 capsule (300 mg total) by mouth 3 (three) times daily as needed (Pain). -     acetaminophen -codeine  (TYLENOL  #3) 300-30 MG tablet; Take 1-2 tablets by mouth every 4 (four) hours as needed for moderate pain (pain score 4-6). Caution advised with Tylenol  3 due to side effects. - Sent prescription for Tylenol  3 and ibuprofen  to [facility].   Anxiety and depression Plans to contact previous therapist for resumption of sessions.  Skin lesions in groin area - Monitor for changes in size or drainage.  Patient has been  counseled on age-appropriate routine health concerns for screening and prevention. These are reviewed and up-to-date. Referrals have been placed accordingly. Immunizations are up-to-date or declined.    Subjective:   Chief Complaint  Patient presents with   Insomnia   Hypertension    History of Present Illness Kendra Griffith is a 58 year old female who presents for HTN and with insomnia and easy bruising.  58 y.o. AAF with past medical history of HTN, DM, tobacco use, anxiety, bipolar disorder, a remote history of ETOH abuse, and diagnosis of systolic heart failure and LE DVT in 5/24.     HTN  Blood pressure is well-controlled with bisoprolol  0.5 mL daily, spironolactone  25 mg daily and losartan  25 mg daily. BP Readings from Last 3 Encounters:  12/13/23 112/69  10/31/23 110/80  09/22/23 (!) 122/105     She experiences significant insomnia primarily due to noise disturbances from a neighbor, describing 'banging on the walls' that occurs throughout the night. This has been ongoing for almost two years and has severely impacted her sleep, leading to sleep deprivation. She has not tried any sleep medications recently but has previously used amitriptyline  for pain and depression, which also caused drowsiness during the day..  She reports easy bruising, noting that bruises are appearing more frequently and are not resolving as expected. She mentions minor trauma to her skin, such as bumping into objects, which she attributes to the bruising. She is concerned about a bruise on her side that she does not recall  the cause of.  She is not currently in psychotherapy and has not been for some time. She wants to return to therapy and plans to contact her previous therapist.   Reports pea-sized lesions in groin, not related to shaving.  Nontender to touch   DM2 A1c at goal of less than 7.  Currently diet controlled Lab Results  Component Value Date   HGBA1C 6.6 (H) 12/13/2023  LDL at goal  with low intensity statin. Lab Results  Component Value Date   LDLCALC 41 12/13/2023     Review of Systems  Constitutional:  Negative for fever, malaise/fatigue and weight loss.  HENT: Negative.  Negative for nosebleeds.   Eyes: Negative.  Negative for blurred vision, double vision and photophobia.  Respiratory: Negative.  Negative for cough and shortness of breath.   Cardiovascular: Negative.  Negative for chest pain, palpitations and leg swelling.  Gastrointestinal: Negative.  Negative for heartburn, nausea and vomiting.  Musculoskeletal: Negative.  Negative for myalgias.  Skin:        See HPI  Neurological: Negative.  Negative for dizziness, focal weakness, seizures and headaches.  Psychiatric/Behavioral:  Positive for depression. Negative for suicidal ideas. The patient is nervous/anxious and has insomnia.     Past Medical History:  Diagnosis Date   ABSCESS, TOOTH 08/20/2009   Qualifier: Diagnosis of  By: Lelon RIGGERS, Scott     Anemia    Anxiety    Bipolar disorder (HCC)    Blood transfusion without reported diagnosis    childbirth   Chronic pain    feet and back   Clotting disorder (HCC)    childbirth   Dental caries    lesion soft palate   Depression    Diabetes mellitus without complication (HCC)    prediabetes   GERD (gastroesophageal reflux disease)    Hypertension    Injury    right side from jumping off bunkbed   Pre-diabetes    Sickle cell trait (HCC)    Wears glasses     Past Surgical History:  Procedure Laterality Date   BARTHOLIN CYST MARSUPIALIZATION N/A 07/16/2019   Procedure: EXCISION OF BARTHOLIN CYST;  Surgeon: Lorence Ozell CROME, MD;  Location: [facility];  Service: Gynecology;  Laterality: N/A;   BIV ICD INSERTION CRT-D N/A 03/23/2023   Procedure: BIV ICD INSERTION CRT-D;  Surgeon: Inocencio Soyla Lunger, MD;  Location: [facility] INVASIVE CV LAB;  Service: Cardiovascular;  Laterality: N/A;   CESAREAN SECTION     three   COLONOSCOPY      cyst removal 1997     FACIAL RECONSTRUCTION SURGERY     FOOT SURGERY Bilateral    LESION REMOVAL Left 10/15/2018   Procedure: Lesion Removal left soft palate;  Surgeon: Sheryle Hamilton, DDS;  Location: [facility] OR;  Service: Oral Surgery;  Laterality: Left;   RIGHT/LEFT HEART CATH AND CORONARY ANGIOGRAPHY N/A 08/29/2022   Procedure: RIGHT/LEFT HEART CATH AND CORONARY ANGIOGRAPHY;  Surgeon: Cherrie Toribio SAUNDERS, MD;  Location: [facility] INVASIVE CV LAB;  Service: Cardiovascular;  Laterality: N/A;   TOOTH EXTRACTION Bilateral 10/15/2018   Procedure: DENTAL EXTRACTIONS OF TEETH NUMBER TWO, FOUR, FIVE, SIX, SEVEN, EIGHT, NINE, TEN, ELEVEN, TWELVE, THIRTEEN, FOURTEEN, SEVENTEEN, TWENTY-ONE, TWENTY-TWO, TWENTY-THREE, TWENTY-FOUR, TWENTY-FIVE, TWENTY-SIX, TWENTY-SEVEN, TWENTY-EIGHT WITH ALVEOLOPLASTY;  Surgeon: Sheryle Hamilton, DDS;  Location: [facility] OR;  Service: Oral Surgery;  Laterality: Bilateral;   TRANSESOPHAGEAL ECHOCARDIOGRAM (CATH LAB) N/A 09/22/2023   Procedure: TRANSESOPHAGEAL ECHOCARDIOGRAM;  Surgeon: Rolan Ezra RAMAN, MD;  Location: [facility] INVASIVE CV LAB;  Service: Cardiovascular;  Laterality: N/A;   TUBAL LIGATION      Family History  Problem Relation Age of Onset   Heart disease Mother    Asthma Sister    Diabetes Sister    COPD Sister    Diabetes Sister    Diabetes Sister    Breast cancer Neg Hx    Colon cancer Neg Hx    Esophageal cancer Neg Hx    Pancreatic cancer Neg Hx    Liver disease Neg Hx    Stomach cancer Neg Hx     Social History Reviewed with no changes to be made today.   Outpatient Medications Prior to Visit  Medication Sig Dispense Refill   albuterol  (VENTOLIN  HFA) 108 (90 Base) MCG/ACT inhaler Inhale 2 puffs into the lungs every 6 (six) hours as needed for wheezing or shortness of breath. 18 g 0   amitriptyline  (ELAVIL ) 75 MG tablet Take 1 tablet (75 mg total) by mouth at bedtime for pain and depression 90 tablet 1   apixaban  (ELIQUIS ) 5 MG TABS tablet Take 1 tablet (5 mg total) by mouth  2 (two) times daily. 60 tablet 6   BIOTIN 5000 PO Take 1 tablet by mouth daily.     bisoprolol  (ZEBETA ) 5 MG tablet Take 0.5 tablets (2.5 mg total) by mouth daily. 15 tablet 11   cholecalciferol (VITAMIN D3) 25 MCG (1000 UT) tablet Take 1,000 Units by mouth daily.     dapagliflozin  propanediol (FARXIGA ) 10 MG TABS tablet Take 1 tablet (10 mg total) by mouth daily. 30 tablet 11   famotidine  (PEPCID ) 20 MG tablet Take 1 tablet (20 mg total) by mouth 2 (two) times daily for heartburn 180 tablet 1   hydrOXYzine  (ATARAX ) 50 MG tablet Take 1 tablet (50 mg total) by mouth 3 (three) times daily as needed. Please deliver 90 tablet 2   ibuprofen  (ADVIL ) 200 MG tablet Take 200-800 mg by mouth every 6 (six) hours as needed for moderate pain (pain score 4-6).     losartan  (COZAAR ) 25 MG tablet Take 0.5 tablets (12.5 mg total) by mouth at bedtime. 45 tablet 3   Menthol, Topical Analgesic, (BIOFREEZE EX) Apply 1 spray topically daily as needed (foot pain). Menthol     Multiple Vitamins-Minerals (MULTIVITAMIN WITH MINERALS) tablet Take 1 tablet by mouth daily.     rosuvastatin  (CRESTOR ) 5 MG tablet Take 1 tablet (5 mg total) by mouth daily for cholesterol 90 tablet 0   spironolactone  (ALDACTONE ) 25 MG tablet Take 1 tablet (25 mg total) by mouth daily. 90 tablet 3   tetrahydrozoline 0.05 % ophthalmic solution Place 1 drop into both eyes daily as needed (itching /red eyes).     trolamine salicylate (ASPERCREME) 10 % cream Apply 1 Application topically daily as needed (Foot pain). With lidocaine      urea (CARMOL) 40 % CREA Apply 1 application  topically daily as needed (Foot pain).     vitamin B-12 (CYANOCOBALAMIN ) 500 MCG tablet Take 500 mcg by mouth daily as needed (Supplement).     acetaminophen -codeine  (TYLENOL  #3) 300-30 MG tablet Take 1-2 tablets by mouth every 4 (four) hours as needed for moderate pain (pain score 4-6). 60 tablet 0   gabapentin  (NEURONTIN ) 300 MG capsule Take 1 capsule (300 mg total) by mouth  3 (three) times daily as needed (Pain). 90 capsule 1   potassium chloride  SA (KLOR-CON  M) 20 MEQ tablet Take 20 mEq by mouth daily. (Patient not taking: Reported on 12/13/2023)  torsemide  (DEMADEX ) 20 MG tablet Take 20 mg by mouth daily. (Patient not taking: Reported on 12/13/2023)     No facility-administered medications prior to visit.    Allergies  Allergen Reactions   Amoxicillin     Pain   Darvocet [Propoxyphene N-Acetaminophen ] Nausea And Vomiting    Makes her jittery   Hydrocodone-Acetaminophen  Nausea And Vomiting    Upset stomach   Percocet [Oxycodone -Acetaminophen ] Other (See Comments)    Jittery   Valium [Diazepam] Other (See Comments)    Makes pt. Feel out of wack       Objective:    BP 112/69 (BP Location: Left Arm, Patient Position: Sitting, Cuff Size: Normal)   Pulse 60   Resp 18   Ht 5' 5 (1.651 m)   Wt 162 lb (73.5 kg)   LMP 03/12/2019 (Approximate)   SpO2 98%   BMI 26.96 kg/m  Wt Readings from Last 3 Encounters:  12/13/23 162 lb (73.5 kg)  10/31/23 159 lb 12.8 oz (72.5 kg)  08/11/23 160 lb 0.9 oz (72.6 kg)    Physical Exam Vitals and nursing note reviewed.  Constitutional:      Appearance: She is well-developed.  HENT:     Head: Normocephalic and atraumatic.  Cardiovascular:     Rate and Rhythm: Normal rate and regular rhythm.     Heart sounds: Normal heart sounds. No murmur heard.    No friction rub. No gallop.  Pulmonary:     Effort: Pulmonary effort is normal. No tachypnea or respiratory distress.     Breath sounds: Normal breath sounds. No decreased breath sounds, wheezing, rhonchi or rales.  Chest:     Chest wall: No tenderness.  Musculoskeletal:        General: Normal range of motion.     Cervical back: Normal range of motion.  Skin:    General: Skin is warm and dry.  Neurological:     Mental Status: She is alert and oriented to person, place, and time.     Coordination: Coordination normal.  Psychiatric:        Behavior:  Behavior normal. Behavior is cooperative.        Thought Content: Thought content normal.        Judgment: Judgment normal.          Patient has been counseled extensively about nutrition and exercise as well as the importance of adherence with medications and regular follow-up. The patient was given clear instructions to go to ER or return to medical center if symptoms don't improve, worsen or new problems develop. The patient verbalized understanding.   Follow-up: Return in about 6 months (around 06/11/2024).   Haze LELON Servant, FNP-BC [facility] [place], [place] [phone]   12/18/2023, 1:37 PM

## 2023-12-14 ENCOUNTER — Telehealth (HOSPITAL_COMMUNITY): Payer: Self-pay | Admitting: Cardiology

## 2023-12-14 ENCOUNTER — Encounter: Payer: Self-pay | Admitting: Nurse Practitioner

## 2023-12-14 LAB — CBC WITH DIFFERENTIAL/PLATELET
Basophils Absolute: 0.1 x10E3/uL (ref 0.0–0.2)
Basos: 1 %
EOS (ABSOLUTE): 0.1 x10E3/uL (ref 0.0–0.4)
Eos: 2 %
Hematocrit: 39.9 % (ref 34.0–46.6)
Hemoglobin: 12.9 g/dL (ref 11.1–15.9)
Immature Grans (Abs): 0 x10E3/uL (ref 0.0–0.1)
Immature Granulocytes: 0 %
Lymphocytes Absolute: 4.6 x10E3/uL — ABNORMAL HIGH (ref 0.7–3.1)
Lymphs: 62 %
MCH: 27.4 pg (ref 26.6–33.0)
MCHC: 32.3 g/dL (ref 31.5–35.7)
MCV: 85 fL (ref 79–97)
Monocytes Absolute: 0.5 x10E3/uL (ref 0.1–0.9)
Monocytes: 6 %
Neutrophils Absolute: 2.1 x10E3/uL (ref 1.4–7.0)
Neutrophils: 29 %
Platelets: 153 x10E3/uL (ref 150–450)
RBC: 4.7 x10E6/uL (ref 3.77–5.28)
RDW: 14.3 % (ref 11.7–15.4)
WBC: 7.4 x10E3/uL (ref 3.4–10.8)

## 2023-12-14 LAB — LIPID PANEL
Chol/HDL Ratio: 1.9 ratio (ref 0.0–4.4)
Cholesterol, Total: 114 mg/dL (ref 100–199)
HDL: 61 mg/dL (ref >39)
LDL Chol Calc (NIH): 41 mg/dL (ref 0–99)
Triglycerides: 47 mg/dL (ref 0–149)
VLDL Cholesterol Cal: 12 mg/dL (ref 5–40)

## 2023-12-14 LAB — HEMOGLOBIN A1C
Est. average glucose Bld gHb Est-mCnc: 143 mg/dL
Hgb A1c MFr Bld: 6.6 % — ABNORMAL HIGH (ref 4.8–5.6)

## 2023-12-14 NOTE — Telephone Encounter (Signed)
 OK to complete this letter for her.

## 2023-12-14 NOTE — Telephone Encounter (Signed)
 Patient called to request letter for landlord, reports she is having trouble from a loud neighbor. Noise from neighbors makes patient unable to sleep,she has missed multiple appointments due to irregular sleep patterns, loud banging on walls have caused disruptions with device equipment (pacemaker)   Reports the letter to landlord will help her move   Seen by PCP yesterday and only given sleep aid   Advised will forward to provider to see if appropriate to complete from a cardiac stand point

## 2023-12-15 LAB — MICROALBUMIN / CREATININE URINE RATIO
Creatinine, Urine: 118.5 mg/dL
Microalb/Creat Ratio: <3 mg/g{creat} (ref 0–29)
Microalbumin, Urine: <3 ug/mL

## 2023-12-17 ENCOUNTER — Ambulatory Visit: Payer: Self-pay | Admitting: Nurse Practitioner

## 2023-12-19 ENCOUNTER — Ambulatory Visit
Admission: RE | Admit: 2023-12-19 | Discharge: 2023-12-19 | Disposition: A | Discharge status: Home / Self Care | Source: Ambulatory Visit | Attending: Nurse Practitioner | Admitting: Nurse Practitioner

## 2023-12-19 DIAGNOSIS — F17200 Nicotine dependence, unspecified, uncomplicated: Secondary | ICD-10-CM

## 2023-12-19 NOTE — Telephone Encounter (Signed)
 Copied from CRM [id number]. Topic: Clinical - Lab/Test Results >> Dec 18, 2023  3:16 PM Donna BRAVO wrote: Reason for CRM: patient returning call from  Jackolyn Liane GAILS, CMA

## 2023-12-22 ENCOUNTER — Other Ambulatory Visit (HOSPITAL_COMMUNITY): Payer: Self-pay

## 2023-12-22 ENCOUNTER — Other Ambulatory Visit: Payer: Self-pay

## 2023-12-22 ENCOUNTER — Other Ambulatory Visit: Payer: Self-pay | Admitting: Family Medicine

## 2023-12-22 ENCOUNTER — Ambulatory Visit (INDEPENDENT_AMBULATORY_CARE_PROVIDER_SITE_OTHER): Payer: Medicaid Other

## 2023-12-22 DIAGNOSIS — I5022 Chronic systolic (congestive) heart failure: Secondary | ICD-10-CM

## 2023-12-22 DIAGNOSIS — E785 Hyperlipidemia, unspecified: Secondary | ICD-10-CM

## 2023-12-22 LAB — CUP PACEART REMOTE DEVICE CHECK
Battery Remaining Longevity: 130 mo
Battery Voltage: 3.02 V
Brady Statistic AP VP Percent: 3.81 %
Brady Statistic AP VS Percent: 0.1 %
Brady Statistic AS VP Percent: 82.23 %
Brady Statistic AS VS Percent: 13.87 %
Brady Statistic RA Percent Paced: 5.72 %
Brady Statistic RV Percent Paced: 6.04 %
Date Time Interrogation Session: 20250911214654
HighPow Impedance: 61 Ohm
Implantable Lead Connection Status: 753985
Implantable Lead Connection Status: 753985
Implantable Lead Connection Status: 753985
Implantable Lead Implant Date: 20241212
Implantable Lead Implant Date: 20241212
Implantable Lead Implant Date: 20241212
Implantable Lead Location: 753858
Implantable Lead Location: 753859
Implantable Lead Location: 753860
Implantable Lead Model: 4598
Implantable Lead Model: 5076
Implantable Pulse Generator Implant Date: 20241212
Lead Channel Impedance Value: 380 Ohm
Lead Channel Impedance Value: 399 Ohm
Lead Channel Impedance Value: 399 Ohm
Lead Channel Impedance Value: 399 Ohm
Lead Channel Impedance Value: 437 Ohm
Lead Channel Impedance Value: 456 Ohm
Lead Channel Impedance Value: 513 Ohm
Lead Channel Impedance Value: 532 Ohm
Lead Channel Impedance Value: 722 Ohm
Lead Channel Impedance Value: 722 Ohm
Lead Channel Impedance Value: 722 Ohm
Lead Channel Impedance Value: 741 Ohm
Lead Channel Impedance Value: 779 Ohm
Lead Channel Pacing Threshold Amplitude: 0.5 V
Lead Channel Pacing Threshold Amplitude: 0.5 V
Lead Channel Pacing Threshold Amplitude: 0.875 V
Lead Channel Pacing Threshold Pulse Width: 0.4 ms
Lead Channel Pacing Threshold Pulse Width: 0.4 ms
Lead Channel Pacing Threshold Pulse Width: 0.4 ms
Lead Channel Sensing Intrinsic Amplitude: 0.6 mV
Lead Channel Sensing Intrinsic Amplitude: 10 mV
Lead Channel Setting Pacing Amplitude: 1.5 V
Lead Channel Setting Pacing Amplitude: 1.5 V
Lead Channel Setting Pacing Amplitude: 2 V
Lead Channel Setting Pacing Pulse Width: 0.4 ms
Lead Channel Setting Pacing Pulse Width: 0.4 ms
Lead Channel Setting Sensing Sensitivity: 0.3 mV
Zone Setting Status: 755011
Zone Setting Status: 755011
Zone Setting Status: 755011

## 2023-12-22 MED ORDER — ROSUVASTATIN CALCIUM 5 MG PO TABS
5.0000 mg | ORAL_TABLET | Freq: Every day | ORAL | 0 refills | Status: DC
  Filled 2023-12-22: qty 90, 90d supply, fill #0

## 2023-12-22 NOTE — Telephone Encounter (Signed)
 Done patient aware As requested sent via mychart

## 2023-12-26 ENCOUNTER — Ambulatory Visit: Payer: Self-pay | Admitting: Cardiology

## 2023-12-27 ENCOUNTER — Encounter (HOSPITAL_COMMUNITY): Payer: Self-pay | Admitting: Cardiology

## 2023-12-29 NOTE — Progress Notes (Signed)
Remote ICD Transmission.

## 2024-01-19 ENCOUNTER — Encounter (HOSPITAL_COMMUNITY): Payer: Self-pay

## 2024-01-19 ENCOUNTER — Emergency Department (HOSPITAL_COMMUNITY)

## 2024-01-19 ENCOUNTER — Emergency Department (HOSPITAL_COMMUNITY)
Admission: EM | Admit: 2024-01-19 | Discharge: 2024-01-19 | Discharge status: Against Medical Advice | Attending: Emergency Medicine | Admitting: Emergency Medicine

## 2024-01-19 ENCOUNTER — Other Ambulatory Visit: Payer: Self-pay

## 2024-01-19 DIAGNOSIS — Z5321 Procedure and treatment not carried out due to patient leaving prior to being seen by health care provider: Secondary | ICD-10-CM | POA: Diagnosis not present

## 2024-01-19 DIAGNOSIS — R059 Cough, unspecified: Secondary | ICD-10-CM | POA: Insufficient documentation

## 2024-01-19 NOTE — ED Triage Notes (Signed)
 Pt c/o productive cough; states cold symptoms have been lingering over past month; pt unsure if having fevers; denies pain, denies sob

## 2024-01-19 NOTE — ED Notes (Signed)
 Pt called numerous times when pt was given room, with no answer. Pt was not in triage either. Pt pulled OTF.

## 2024-01-22 ENCOUNTER — Ambulatory Visit: Payer: Self-pay

## 2024-01-22 NOTE — Telephone Encounter (Signed)
 FYI Only or Action Required?: FYI only for provider.  Patient was last seen in primary care on 12/13/2023 by Theotis Haze ORN, NP.  Called Nurse Triage reporting Cough.  Symptoms began several weeks ago.  Interventions attempted: OTC medications: Theraflu, Coricidin.  Symptoms are: unchanged.  Triage Disposition: See Physician Within 24 Hours  Patient/caregiver understands and will follow disposition?: Yes   **Referred to UC due to lack of appt. Availability**       Copied from CRM [id number]. Topic: Clinical - Red Word Triage >> Jan 22, 2024  9:38 AM Larissa RAMAN wrote: [facility] that prompted transfer to Nurse Triage: chest congestion, cough w/ phlegm x 2 weeks Reason for Disposition  [1] Continuous (nonstop) coughing interferes with work or school AND [2] no improvement using cough treatment per Care Advice  Answer Assessment - Initial Assessment Questions 1. ONSET: When did the cough begin?      X 2 weeks   2. SEVERITY: How bad is the cough today?      Constant   3. SPUTUM: Describe the color of your sputum (e.g., none, dry cough; clear, white, yellow, green)     Clear, yellowish   4. HEMOPTYSIS: Are you coughing up any blood? If Yes, ask: How much? (e.g., flecks, streaks, tablespoons, etc.)    Yes, x 2 weeks ago,  flecks   5. DIFFICULTY BREATHING: Are you having difficulty breathing? If Yes, ask: How bad is it? (e.g., mild, moderate, severe)      No   6. FEVER: Do you have a fever? If Yes, ask: What is your temperature, how was it measured, and when did it start?     No   7. CARDIAC HISTORY: Do you have any history of heart disease? (e.g., heart attack, congestive heart failure)      Pacemaker for Heart Failure  8. LUNG HISTORY: Do you have any history of lung disease?  (e.g., pulmonary embolus, asthma, emphysema)     No   9. PE RISK FACTORS: Do you have a history of blood clots? (or: recent major surgery, recent prolonged travel,  bedridden)     Yes  10. OTHER SYMPTOMS: Do you have any other symptoms? (e.g., runny nose, wheezing, chest pain)       chest congestion   Went to the ED last Friday for symptoms, she left due to the long wait. For home care she is taking Theraflu, Coricidin. Referred to UC due to lack of appt. Availability; she agrees with plan of care.  Protocols used: Cough - Acute Productive-A-AH

## 2024-01-22 NOTE — Telephone Encounter (Signed)
 Virtual visit for tomorrow

## 2024-01-22 NOTE — Telephone Encounter (Signed)
 Appointment scheduled.

## 2024-01-23 ENCOUNTER — Ambulatory Visit (HOSPITAL_BASED_OUTPATIENT_CLINIC_OR_DEPARTMENT_OTHER): Admitting: Nurse Practitioner

## 2024-01-23 ENCOUNTER — Encounter: Payer: Self-pay | Admitting: Nurse Practitioner

## 2024-01-23 ENCOUNTER — Other Ambulatory Visit: Payer: Self-pay

## 2024-01-23 ENCOUNTER — Other Ambulatory Visit (HOSPITAL_COMMUNITY): Payer: Self-pay

## 2024-01-23 DIAGNOSIS — J40 Bronchitis, not specified as acute or chronic: Secondary | ICD-10-CM

## 2024-01-23 DIAGNOSIS — F17200 Nicotine dependence, unspecified, uncomplicated: Secondary | ICD-10-CM

## 2024-01-23 MED ORDER — AZITHROMYCIN 250 MG PO TABS
ORAL_TABLET | ORAL | 0 refills | Status: AC
  Filled 2024-01-23: qty 6, 5d supply, fill #0

## 2024-01-23 MED ORDER — ALBUTEROL SULFATE HFA 108 (90 BASE) MCG/ACT IN AERS
2.0000 | INHALATION_SPRAY | Freq: Four times a day (QID) | RESPIRATORY_TRACT | 0 refills | Status: AC | PRN
  Filled 2024-01-23: qty 18, 25d supply, fill #0

## 2024-01-23 MED ORDER — PREDNISONE 20 MG PO TABS
20.0000 mg | ORAL_TABLET | Freq: Every day | ORAL | 0 refills | Status: AC
  Filled 2024-01-23: qty 5, 5d supply, fill #0

## 2024-01-23 MED ORDER — PROMETHAZINE-DM 6.25-15 MG/5ML PO SYRP
5.0000 mL | ORAL_SOLUTION | Freq: Four times a day (QID) | ORAL | 0 refills | Status: AC | PRN
  Filled 2024-01-23: qty 240, 12d supply, fill #0

## 2024-01-23 NOTE — Progress Notes (Signed)
 Virtual Visit Consent   Kendra Griffith, you are scheduled for a virtual visit with a [facility] provider today. Just as with appointments in the office, your consent must be obtained to participate. Your consent will be active for this visit and any virtual visit you may have with one of our providers in the next 365 days. If you have a MyChart account, a copy of this consent can be sent to you electronically.  As this is a virtual visit, video technology does not allow for your provider to perform a traditional examination. This may limit your provider's ability to fully assess your condition. If your provider identifies any concerns that need to be evaluated in person or the need to arrange testing (such as labs, EKG, etc.), we will make arrangements to do so. Although advances in technology are sophisticated, we cannot ensure that it will always work on either your end or our end. If the connection with a video visit is poor, the visit may have to be switched to a telephone visit. With either a video or telephone visit, we are not always able to ensure that we have a secure connection.  By engaging in this virtual visit, you consent to the provision of healthcare and authorize for your insurance to be billed (if applicable) for the services provided during this visit. Depending on your insurance coverage, you may receive a charge related to this service.  I need to obtain your verbal consent now. Are you willing to proceed with your visit today? Kendra Griffith has provided verbal consent on 01/23/2024 for a virtual visit (video or telephone). Kendra LELON Servant, NP  Date: 01/23/2024 1:39 PM   Virtual Visit via Video Note   I, Kendra Griffith, connected with  Kendra Griffith  ([id number], January 31, 1966) on 01/23/24 at 11:10 AM EDT by a video-enabled telemedicine application and verified that I am speaking with the correct person using two identifiers.  Location: Patient: Virtual Visit Location  Patient: Home Provider: Virtual Visit Location Provider: Home Office   I discussed the limitations of evaluation and management by telemedicine and the availability of in person appointments. The patient expressed understanding and agreed to proceed.    History of Present Illness: Kendra Griffith is a 58 y.o. who identifies as a female who was assigned female at birth, and is being seen today for uri with cough and congestion.   Kendra Griffith has been experiencing URI symptoms over the past 3-4 weeks.  States she caught a cold around the 15th of september. Started off with runny nose.  Took TheraFlu and Coricidin with no relief.  A few weeks later she developed nasal congestion with blood-streaked nasal drainage and persistent cough.  Believes she had a fever but did not check her temperature.  Cough is now productive with wheezing.       Problems:  Patient Active Problem List   Diagnosis Date Noted   Nonrheumatic tricuspid valve regurgitation 09/22/2023   Acute LLE DVT (deep venous thrombosis) (HCC) 08/27/2022   New onset of congestive heart failure (HCC) 08/26/2022   Elevated transaminase level 08/26/2022   Drug-induced mood disorder (HCC) 05/18/2022   Post-operative state 09/04/2019   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   Tobacco use 06/09/2017   Breast cancer screening 01/21/2016   Chronic pain syndrome 09/25/2014   Type 2 diabetes mellitus with diabetic polyneuropathy, without long-term current use of insulin  (HCC) 06/30/2014   Peripheral neuropathy 05/09/2013   Poor dentition 02/01/2013  Pain in joint, ankle and foot 02/01/2013   Essential hypertension, benign 02/01/2013   FOOT PAIN, BILATERAL 06/16/2010   RECTAL BLEEDING 12/08/2009   HEMATURIA UNSPECIFIED 12/08/2009   Depression with anxiety 08/20/2009   Mixed hyperlipidemia 07/30/2009   ANEMIA-NOS 07/30/2009   GERD 07/30/2009   CALLUS, FOOT 07/30/2009    Allergies:  Allergies  Allergen Reactions    Amoxicillin     Pain   Darvocet [Propoxyphene N-Acetaminophen ] Nausea And Vomiting    Makes her jittery   Hydrocodone-Acetaminophen  Nausea And Vomiting    Upset stomach   Percocet [Oxycodone -Acetaminophen ] Other (See Comments)    Jittery   Valium [Diazepam] Other (See Comments)    Makes pt. Feel out of wack   Medications:  Current Outpatient Medications:    azithromycin (ZITHROMAX) 250 MG tablet, Take 2 tablets on day 1, then 1 tablet daily on days 2 through 5, Disp: 6 tablet, Rfl: 0   predniSONE (DELTASONE) 20 MG tablet, Take 1 tablet (20 mg total) by mouth daily with breakfast for 5 days., Disp: 5 tablet, Rfl: 0   promethazine -dextromethorphan (PROMETHAZINE -DM) 6.25-15 MG/5ML syrup, Take 5 mLs by mouth 4 (four) times daily as needed for cough., Disp: 240 mL, Rfl: 0   acetaminophen -codeine  (TYLENOL  #3) 300-30 MG tablet, Take 1-2 tablets by mouth every 4 (four) hours as needed for moderate pain (pain score 4-6)., Disp: 60 tablet, Rfl: 0   albuterol  (VENTOLIN  HFA) 108 (90 Base) MCG/ACT inhaler, Inhale 2 puffs into the lungs every 6 (six) hours as needed for wheezing or shortness of breath., Disp: 18 g, Rfl: 0   apixaban  (ELIQUIS ) 5 MG TABS tablet, Take 1 tablet (5 mg total) by mouth 2 (two) times daily., Disp: 60 tablet, Rfl: 6   BIOTIN 5000 PO, Take 1 tablet by mouth daily., Disp: , Rfl:    bisoprolol  (ZEBETA ) 5 MG tablet, Take 0.5 tablets (2.5 mg total) by mouth daily., Disp: 15 tablet, Rfl: 11   cholecalciferol (VITAMIN D3) 25 MCG (1000 UT) tablet, Take 1,000 Units by mouth daily., Disp: , Rfl:    dapagliflozin  propanediol (FARXIGA ) 10 MG TABS tablet, Take 1 tablet (10 mg total) by mouth daily., Disp: 30 tablet, Rfl: 11   famotidine  (PEPCID ) 20 MG tablet, Take 1 tablet (20 mg total) by mouth 2 (two) times daily for heartburn, Disp: 180 tablet, Rfl: 1   gabapentin  (NEURONTIN ) 300 MG capsule, Take 1 capsule (300 mg total) by mouth 3 (three) times daily as needed (Pain)., Disp: 90 capsule,  Rfl: 3   hydrOXYzine  (ATARAX ) 50 MG tablet, Take 1 tablet (50 mg total) by mouth 3 (three) times daily as needed. Please deliver, Disp: 90 tablet, Rfl: 2   ibuprofen  (ADVIL ) 200 MG tablet, Take 200-800 mg by mouth every 6 (six) hours as needed for moderate pain (pain score 4-6)., Disp: , Rfl:    losartan  (COZAAR ) 25 MG tablet, Take 0.5 tablets (12.5 mg total) by mouth at bedtime., Disp: 45 tablet, Rfl: 3   Menthol, Topical Analgesic, (BIOFREEZE EX), Apply 1 spray topically daily as needed (foot pain). Menthol, Disp: , Rfl:    Multiple Vitamins-Minerals (MULTIVITAMIN WITH MINERALS) tablet, Take 1 tablet by mouth daily., Disp: , Rfl:    potassium chloride  SA (KLOR-CON  M) 20 MEQ tablet, Take 20 mEq by mouth daily. (Patient not taking: Reported on 12/13/2023), Disp: , Rfl:    rosuvastatin  (CRESTOR ) 5 MG tablet, Take 1 tablet (5 mg total) by mouth daily for cholesterol, Disp: 90 tablet, Rfl: 0   spironolactone  (ALDACTONE )  25 MG tablet, Take 1 tablet (25 mg total) by mouth daily., Disp: 90 tablet, Rfl: 3   tetrahydrozoline 0.05 % ophthalmic solution, Place 1 drop into both eyes daily as needed (itching /red eyes)., Disp: , Rfl:    torsemide  (DEMADEX ) 20 MG tablet, Take 20 mg by mouth daily. (Patient not taking: Reported on 12/13/2023), Disp: , Rfl:    traZODone  (DESYREL ) 150 MG tablet, Take 1-2 tablets (150-300 mg total) by mouth at bedtime., Disp: 180 tablet, Rfl: 0   trolamine salicylate (ASPERCREME) 10 % cream, Apply 1 Application topically daily as needed (Foot pain). With lidocaine , Disp: , Rfl:    urea (CARMOL) 40 % CREA, Apply 1 application  topically daily as needed (Foot pain)., Disp: , Rfl:    vitamin B-12 (CYANOCOBALAMIN ) 500 MCG tablet, Take 500 mcg by mouth daily as needed (Supplement)., Disp: , Rfl:   Observations/Objective: Patient is well-developed, well-nourished in no acute distress.  Resting comfortably at home.  Head is normocephalic, atraumatic.  No labored breathing.  Speech is clear  and coherent with logical content.  Patient is alert and oriented at baseline.    Assessment and Plan: 1. Bronchitis (Primary) - azithromycin (ZITHROMAX) 250 MG tablet; Take 2 tablets on day 1, then 1 tablet daily on days 2 through 5  Dispense: 6 tablet; Refill: 0 - predniSONE (DELTASONE) 20 MG tablet; Take 1 tablet (20 mg total) by mouth daily with breakfast for 5 days.  Dispense: 5 tablet; Refill: 0 - promethazine -dextromethorphan (PROMETHAZINE -DM) 6.25-15 MG/5ML syrup; Take 5 mLs by mouth 4 (four) times daily as needed for cough.  Dispense: 240 mL; Refill: 0 - albuterol  (VENTOLIN  HFA) 108 (90 Base) MCG/ACT inhaler; Inhale 2 puffs into the lungs every 6 (six) hours as needed for wheezing or shortness of breath.  Dispense: 18 g; Refill: 0  2. Tobacco dependence - albuterol  (VENTOLIN  HFA) 108 (90 Base) MCG/ACT inhaler; Inhale 2 puffs into the lungs every 6 (six) hours as needed for wheezing or shortness of breath.  Dispense: 18 g; Refill: 0    Follow Up Instructions: I discussed the assessment and treatment plan with the patient. The patient was provided an opportunity to ask questions and all were answered. The patient agreed with the plan and demonstrated an understanding of the instructions.  A copy of instructions were sent to the patient via MyChart unless otherwise noted below.     The patient was advised to call back or seek an in-person evaluation if the symptoms worsen or if the condition fails to improve as anticipated.    Shalika Arntz W Lashaya Kienitz, NP

## 2024-01-24 ENCOUNTER — Other Ambulatory Visit (HOSPITAL_COMMUNITY): Payer: Self-pay

## 2024-01-24 ENCOUNTER — Other Ambulatory Visit: Payer: Self-pay

## 2024-01-25 ENCOUNTER — Other Ambulatory Visit: Payer: Self-pay

## 2024-01-25 ENCOUNTER — Other Ambulatory Visit (HOSPITAL_COMMUNITY): Payer: Self-pay

## 2024-01-25 ENCOUNTER — Other Ambulatory Visit (HOSPITAL_COMMUNITY): Payer: Self-pay | Admitting: Family Medicine

## 2024-01-25 MED ORDER — LOSARTAN POTASSIUM 25 MG PO TABS
12.5000 mg | ORAL_TABLET | Freq: Every day | ORAL | 3 refills | Status: AC
  Filled 2024-01-25: qty 45, 90d supply, fill #0
  Filled 2024-02-19 (×3): qty 15, 30d supply, fill #0
  Filled 2024-04-08: qty 15, 30d supply, fill #1
  Filled 2024-05-13 (×2): qty 15, 30d supply, fill #2

## 2024-02-05 ENCOUNTER — Encounter (HOSPITAL_COMMUNITY)

## 2024-02-07 ENCOUNTER — Telehealth (HOSPITAL_COMMUNITY): Payer: Self-pay

## 2024-02-07 NOTE — Telephone Encounter (Signed)
 Called to confirm/remind patient of their appointment at the [facility] on 02/08/24.   Appointment:   [] Confirmed  [x] Left mess   [] No answer/No voice mail  [] VM Full/unable to leave message  [] Phone not in service  And to bring in all medications and/or complete list.

## 2024-02-08 ENCOUNTER — Ambulatory Visit (HOSPITAL_COMMUNITY)
Admission: RE | Admit: 2024-02-08 | Discharge: 2024-02-08 | Disposition: A | Discharge status: Home / Self Care | Source: Ambulatory Visit | Attending: Physician Assistant | Admitting: Physician Assistant

## 2024-02-08 ENCOUNTER — Other Ambulatory Visit: Payer: Self-pay

## 2024-02-08 ENCOUNTER — Other Ambulatory Visit (HOSPITAL_COMMUNITY): Payer: Self-pay

## 2024-02-08 VITALS — BP 112/72 | HR 84 | Wt 163.8 lb

## 2024-02-08 DIAGNOSIS — Z79899 Other long term (current) drug therapy: Secondary | ICD-10-CM | POA: Insufficient documentation

## 2024-02-08 DIAGNOSIS — F17210 Nicotine dependence, cigarettes, uncomplicated: Secondary | ICD-10-CM | POA: Diagnosis not present

## 2024-02-08 DIAGNOSIS — Z7901 Long term (current) use of anticoagulants: Secondary | ICD-10-CM | POA: Insufficient documentation

## 2024-02-08 DIAGNOSIS — I5022 Chronic systolic (congestive) heart failure: Secondary | ICD-10-CM | POA: Diagnosis present

## 2024-02-08 DIAGNOSIS — Z7984 Long term (current) use of oral hypoglycemic drugs: Secondary | ICD-10-CM | POA: Diagnosis not present

## 2024-02-08 DIAGNOSIS — I10 Essential (primary) hypertension: Secondary | ICD-10-CM | POA: Diagnosis not present

## 2024-02-08 DIAGNOSIS — Z59869 Financial insecurity, unspecified: Secondary | ICD-10-CM | POA: Diagnosis not present

## 2024-02-08 DIAGNOSIS — I110 Hypertensive heart disease with heart failure: Secondary | ICD-10-CM | POA: Diagnosis not present

## 2024-02-08 DIAGNOSIS — F419 Anxiety disorder, unspecified: Secondary | ICD-10-CM | POA: Diagnosis not present

## 2024-02-08 DIAGNOSIS — Z716 Tobacco abuse counseling: Secondary | ICD-10-CM | POA: Insufficient documentation

## 2024-02-08 DIAGNOSIS — Z5982 Transportation insecurity: Secondary | ICD-10-CM | POA: Insufficient documentation

## 2024-02-08 DIAGNOSIS — I428 Other cardiomyopathies: Secondary | ICD-10-CM | POA: Diagnosis not present

## 2024-02-08 DIAGNOSIS — F319 Bipolar disorder, unspecified: Secondary | ICD-10-CM | POA: Diagnosis not present

## 2024-02-08 DIAGNOSIS — E119 Type 2 diabetes mellitus without complications: Secondary | ICD-10-CM | POA: Insufficient documentation

## 2024-02-08 DIAGNOSIS — Z5941 Food insecurity: Secondary | ICD-10-CM | POA: Diagnosis not present

## 2024-02-08 DIAGNOSIS — Z86718 Personal history of other venous thrombosis and embolism: Secondary | ICD-10-CM | POA: Diagnosis not present

## 2024-02-08 DIAGNOSIS — Z720 Tobacco use: Secondary | ICD-10-CM | POA: Diagnosis not present

## 2024-02-08 LAB — BASIC METABOLIC PANEL WITH GFR
Anion gap: 8 (ref 5–15)
BUN: 15 mg/dL (ref 6–20)
CO2: 26 mmol/L (ref 22–32)
Calcium: 9.5 mg/dL (ref 8.9–10.3)
Chloride: 109 mmol/L (ref 98–111)
Creatinine, Ser: 1.06 mg/dL — ABNORMAL HIGH (ref 0.44–1.00)
GFR, Estimated: >60 mL/min (ref >60)
Glucose, Bld: 128 mg/dL — ABNORMAL HIGH (ref 70–99)
Potassium: 4.6 mmol/L (ref 3.5–5.1)
Sodium: 143 mmol/L (ref 135–145)

## 2024-02-08 LAB — BRAIN NATRIURETIC PEPTIDE: B Natriuretic Peptide: 12.8 pg/mL (ref 0.0–100.0)

## 2024-02-08 MED ORDER — POTASSIUM CHLORIDE CRYS ER 20 MEQ PO TBCR
20.0000 meq | EXTENDED_RELEASE_TABLET | Freq: Every day | ORAL | 3 refills | Status: AC
  Filled 2024-02-08 (×2): qty 90, 90d supply, fill #0
  Filled 2024-05-13 (×2): qty 90, 90d supply, fill #1

## 2024-02-08 MED ORDER — TORSEMIDE 20 MG PO TABS
20.0000 mg | ORAL_TABLET | Freq: Every day | ORAL | 3 refills | Status: AC
  Filled 2024-02-08 (×2): qty 90, 90d supply, fill #0
  Filled 2024-05-13 (×2): qty 90, 90d supply, fill #1

## 2024-02-08 NOTE — Progress Notes (Signed)
 H&V Care Navigation CSW Progress Note  Clinical Social Worker consulted to speak with pt regarding housing concerns.  Pt reports she is being asked to leave her current housing December 31st once her lease is up.  She stays in her home with help from a section 8 voucher and is trying to find alternative housing that accepts vouchers at this time.  Has been given a list by section 8 case worker but does not feel as if she is receiving sufficient assistance in finding housing.  Agreeable to CSW placing referral with [employer] to find housing- left VM with [facility].  CSW also sent list of housing that accepts section 8 for patient review.  Continues to be available to assist as needed  Darnel Mchan H. Norina Cowper, LCSW Clinical Social Worker [facility] Desk#: [phone] Cell#: [phone]

## 2024-02-08 NOTE — Patient Instructions (Addendum)
 Take Torsemide  40 mg for three days then take 20 mg daily after that. Take potassium 20 meq daily. Labs today - will call you if abnormal. Return to [facility] in 2 weeks - see below. Please call us  at [phone] if any questions or concerns prior to your next appointment.  Referral sent to your EP doctor - see below.

## 2024-02-08 NOTE — Progress Notes (Addendum)
 [facility] Note    PCP: Theotis Haze ORN, NP HF Cardiologist: Dr. Rolan  58 y.o. AAF with past medical history of HTN, DM, tobacco use, anxiety, bipolar disorder, a remote history of ETOH abuse, and diagnosis of systolic heart failure and LE DVT in 5/24.   Admitted 5/24 with new acute systolic heart failure and LE DVT. Echo showed EF <20%, GIIIDD, RV mod reduced, mod elevated PASP ~58, LA mod dilated, RA mildly dilated, mild MR, mod TR. Hs trops negative. She was diuresed w/ IV Lasix  and started on GDMT. V/Q scan showed no perfusion defects to suggest PE. Hypercoagulable workup was negative. R/LHC showed normal coronary arteries, elevated filling pressures with low cardiac output (RA 6, PA 47/24/33, PA sat 57%, Fick CI 2.1). She was diuresed further w/ IV Lasix  and transitioned to PO torsemide . GDMT further titrated.   Cardiac MRI in 8/24 showed LV EF 25%, RV EF 36%, no delayed enhancement.    Echo 9/24 EF 25% with septal-lateral dyssynchrony, mild RV dysfunction, and IVC normal.   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   Medtronic CRT-D device placed in 12/24.   Echo 5/25 showed EF 35-40%, concern for possible vegetation on TR or device. She underwent TEE 6/25 showing EF 35-40%, normal RV, no clot or vegetation  Here today for follow-up. Gets short short of breath walking around her apartment, has been packing for a move. Also notes orthopnea. No lower extremity edema. Has been off Torsemide  for a couple of months. She believes someone told her to stop the medication. Smokes cigarettes, no ETOH or drug use. Eats whatever friends or family helps her get, often ramen noodles and other packaged meals.  Has been dealing with a lot of stress d/t her housing situation. Currently receives government aid for housing. She reports a chaotic environment at current apartment and trouble with her landlord. Relies on public  transportation or family to get around.    Medtronic device interrogation (personally reviewed): No AT/AF, no VT, 84% BiV paced, activity level 4.3 hrs /day, optivol elevated and thoracic impedance below threshold   Labs (7/24): K 4.6, creatinine 1.07 => 1.09, BNP 77 Labs (9/24): K 4.0, creatinine 1.10, BNP 50   Labs (11/24): K 4.1, creatinine 1.01 Labs (5/25): K 3.8, creatinine 1.25 Labs (7/25): K 4.1, Scr 1.04 Labs (09/25): LDL 41, Hgb 12.9  PMH: 1. DVT: DVT in 1980.  Left lower leg DVT in 5/24.  - Prothrombin gene mutation negative, factor V Leiden negative, lupus anticoagulant negative 2. Type 2 diabetes 3. GERD 4. HTN 5. Sickle cell trait 6. Active smoker 7. Prior ETOH abuse.  8. Bipolar disorder 9. Chronic systolic CHF: Nonischemic cardiomyopathy.  MDT CRT-D device.  Diagnosis by echo in 5/24 showing EF < 20%, grade III DD, RV moderately reduced, RVSP 58 mmHg, mild MR, moderate TR.  - R/LHC (5/24): normal coronaries; RA 6, PA 47/24 (33), PCWP 23, CO/CI (Fick) 3.4/2.1, PVR 2.6 WU.  - Cardiac MRI (8/24): LVEF 25%, RVEF 36%, no delayed enhancement.   - Echo (9/24): EF 25% with septal-lateral dyssynchrony, mild RV dysfunction, and IVC normal.  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  - Echo 5/25: EF 35-40%, concern for possible vegetation on TR or device.  - TEE (6/25): EF 35-40%, normal RV, no clot or vegetation  FH: Mother with CHF, ? History of rheumatic heart disease.  Sister with CHF.  Current Outpatient Medications  Medication Sig Dispense Refill   acetaminophen -codeine  (TYLENOL  #3) 300-30 MG tablet Take 1-2 tablets by mouth every 4 (four) hours as needed for moderate pain (pain score 4-6). 60 tablet 0   albuterol  (VENTOLIN  HFA) 108 (90 Base) MCG/ACT inhaler Inhale 2 puffs into the lungs every 6 (six) hours as needed for wheezing or shortness of breath. 18 g 0   apixaban  (ELIQUIS ) 5 MG TABS tablet Take 1 tablet (5 mg  total) by mouth 2 (two) times daily. 60 tablet 6   BIOTIN 5000 PO Take 1 tablet by mouth daily.     bisoprolol  (ZEBETA ) 5 MG tablet Take 0.5 tablets (2.5 mg total) by mouth daily. 15 tablet 11   cholecalciferol (VITAMIN D3) 25 MCG (1000 UT) tablet Take 1,000 Units by mouth daily.     dapagliflozin  propanediol (FARXIGA ) 10 MG TABS tablet Take 1 tablet (10 mg total) by mouth daily. 30 tablet 11   famotidine  (PEPCID ) 20 MG tablet Take 1 tablet (20 mg total) by mouth 2 (two) times daily for heartburn 180 tablet 1   gabapentin  (NEURONTIN ) 300 MG capsule Take 1 capsule (300 mg total) by mouth 3 (three) times daily as needed (Pain). 90 capsule 3   hydrOXYzine  (ATARAX ) 50 MG tablet Take 1 tablet (50 mg total) by mouth 3 (three) times daily as needed. Please deliver 90 tablet 2   ibuprofen  (ADVIL ) 200 MG tablet Take 200-800 mg by mouth every 6 (six) hours as needed for moderate pain (pain score 4-6).     losartan  (COZAAR ) 25 MG tablet Take 0.5 tablets (12.5 mg total) by mouth at bedtime. 45 tablet 3   Menthol, Topical Analgesic, (BIOFREEZE EX) Apply 1 spray topically daily as needed (foot pain). Menthol     Multiple Vitamins-Minerals (MULTIVITAMIN WITH MINERALS) tablet Take 1 tablet by mouth daily.     promethazine -dextromethorphan (PROMETHAZINE -DM) 6.25-15 MG/5ML syrup Take 5 mLs by mouth 4 (four) times daily as needed for cough. 240 mL 0   rosuvastatin  (CRESTOR ) 5 MG tablet Take 1 tablet (5 mg total) by mouth daily for cholesterol 90 tablet 0   spironolactone  (ALDACTONE ) 25 MG tablet Take 1 tablet (25 mg total) by mouth daily. 90 tablet 3   tetrahydrozoline 0.05 % ophthalmic solution Place 1 drop into both eyes daily as needed (itching /red eyes).     traZODone  (DESYREL ) 150 MG tablet Take 1-2 tablets (150-300 mg total) by mouth at bedtime. 180 tablet 0   trolamine salicylate (ASPERCREME) 10 % cream Apply 1 Application topically daily as needed (Foot pain). With lidocaine      urea (CARMOL) 40 % CREA Apply  1 application  topically daily as needed (Foot pain).     vitamin B-12 (CYANOCOBALAMIN ) 500 MCG tablet Take 500 mcg by mouth daily as needed (Supplement).     potassium chloride  SA (KLOR-CON  M) 20 MEQ tablet Take 1 tablet (20 mEq total) by mouth daily. 90 tablet 3   torsemide  (DEMADEX ) 20 MG tablet Take 1 tablet (20 mg total) by mouth daily. 90 tablet 3   No current facility-administered medications for this encounter.   Allergies  Allergen Reactions   Amoxicillin     Pain   Darvocet [Propoxyphene N-Acetaminophen ] Nausea And Vomiting    Makes her jittery   Hydrocodone-Acetaminophen  Nausea And Vomiting    Upset stomach   Percocet [Oxycodone -Acetaminophen ] Other (See Comments)    Jittery   Valium [Diazepam] Other (See Comments)    Makes pt. Feel out of wack  Social History   Socioeconomic History   Marital status: Single    Spouse name: Not on file   Number of children: 3   Years of education: 8-9   Highest education level: 8th grade  Occupational History   Occupation: applying for disability  Tobacco Use   Smoking status: Every Day    Current packs/day: 2.00    Average packs/day: 2.0 packs/day for 29.0 years (58.0 ttl pk-yrs)    Types: Cigarettes   Smokeless tobacco: Never  Vaping Use   Vaping status: Never Used  Substance and Sexual Activity   Alcohol  use: No    Alcohol /week: 0.0 standard drinks of alcohol     Comment: Stopped drinking 2013   Drug use: No   Sexual activity: Not Currently    Birth control/protection: None  Other Topics Concern   Not on file  Social History Narrative   Patient lives alone in an apartment on the first floor.  3 children.  Applying for disability.  Education: 8th or 9th grade.   Currently not working   Trying to go back to school online for [employer]   Six Grandchildren   Social Drivers of Health   Financial Resource Strain: Medium Risk (09/02/2022)   Overall Financial Resource Strain (CARDIA)    Difficulty of Paying Living Expenses:  Somewhat hard  Food Insecurity: Food Insecurity Present (10/26/2022)   Hunger Vital Sign    Worried About Running Out of Food in the Last Year: Sometimes true    Ran Out of Food in the Last Year: Sometimes true  Transportation Needs: Unmet Transportation Needs (11/25/2022)   PRAPARE - Administrator, Civil Service (Medical): No    Lack of Transportation (Non-Medical): Yes  Physical Activity: Insufficiently Active (09/02/2022)   Exercise Vital Sign    Days of Exercise per Week: 1 day    Minutes of Exercise per Session: 30 min  Stress: Stress Concern Present (09/02/2022)   [employer] of Occupational Health - Occupational Stress Questionnaire    Feeling of Stress : To some extent  Social Connections: Moderately Isolated (09/02/2022)   Social Connection and Isolation Panel    Frequency of Communication with Friends and Family: Three times a week    Frequency of Social Gatherings with Friends and Family: Once a week    Attends Religious Services: Never    Database Administrator or Organizations: Yes    Attends Banker Meetings: 1 to 4 times per year    Marital Status: Divorced  Intimate Partner Violence: Unknown (06/08/2017)   Humiliation, Afraid, Rape, and Kick questionnaire    Fear of Current or Ex-Partner: Patient declined    Emotionally Abused: Patient declined    Physically Abused: Patient declined    Sexually Abused: Patient declined   Wt Readings from Last 3 Encounters:  02/08/24 74.3 kg (163 lb 12.8 oz)  12/13/23 73.5 kg (162 lb)  10/31/23 72.5 kg (159 lb 12.8 oz)   BP 112/72   Pulse 84   Wt 74.3 kg (163 lb 12.8 oz)   LMP 03/12/2019 (Approximate)   SpO2 97%   BMI 27.26 kg/m   PHYSICAL EXAM: General:  Ambulated into clinic. No distress. Cor: Jvp 9-10. Regular rate & rhythm. No murmurs. Lungs: clear Abdomen: soft, nontender, nondistended. Extremities: no edema Neuro: alert & orientedx3, affect depressed   ASSESSMENT & PLAN: 1. Chronic  Systolic CHF:  Nonischemic cardiomyopathy.  Medtronic CRT-D device.  Echo 5/24 with EF <20%, GIIIDD, RV mod reduced, mod  elevated PASP ~58, LA mod dilated, RA mildly dilated, mild MR, mod TR. NICM. R/LHC showed normal coronaries, elevated filling pressures and low output, CI 2.1.  Cardiac MRI in 8/24 showed no delayed enhancement.  Echo was done 9/24, EF 25% with septal-lateral dyssynchrony, mild RV dysfunction, and IVC normal.  Etiology for cardiomyopathy uncertain. Previous history of heavy alcohol  use but quit a number of years ago. HIV negative.  No viral-type illness prior to diagnosis. She has a FH of CHF (sister and mother). 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 Echo 5/25 showed EF 35-40%. NYHA II/early III, suspect smoking contributing to some of her dyspnea. She is volume up on exam and by OptiVol on her device. - Only 84% effective BiV pacing on device. Refer to EP to see if device needs adjustments. - Restart Torsemide  at 40 mg daily X 3 days then decrease to prior dose of 20 mg daily. Restart 20 mEq potassium daily - Continue spironolactone  25 mg daily.  - Continue Farxiga  10 mg daily. - Continue bisoprolol  2.5 mg at bedtime.  - Continue losartan  12.5 mg at night (hypotension on Entresto ) - She has seen Dr. Fairy for genetic counseling. - Labs today 2. HTN: BP stable - Meds as above. 3. DMII: Continue SGLT2i. - A1c was 6.6 in 9/25 4. Tobacco Abuse: continues to smoke. -Discussed cessation. Not ready to quit especially with recent life stressors. 5. DVT: Left leg DVT found 5/24.  Had DVT back in 1980s after she delivered her son. Hypercoagulable workup negative.  - Long-term Eliquis  given unprovoked DVT.   SDOH: Has medicaid. Meds are delivered to her. Notes trouble with transportation and food insecurity. Receives government aid for housing but currently in a difficult situation at her housing complex. Will engage HF CSW.     Follow up 2 weeks with APP to assess volume and titrate GDMT.   Jaydalyn Demattia N, PA-C 02/08/24

## 2024-02-09 ENCOUNTER — Ambulatory Visit (HOSPITAL_COMMUNITY): Payer: Self-pay | Admitting: Physician Assistant

## 2024-02-13 ENCOUNTER — Telehealth: Payer: Self-pay

## 2024-02-13 ENCOUNTER — Telehealth: Payer: Self-pay | Admitting: Licensed Clinical Social Worker

## 2024-02-13 NOTE — Telephone Encounter (Signed)
 H&V Care Navigation CSW Progress Note  Clinical Social Worker contacted patient by phone to f/u on transportation need. Arranged rides via [place], pt completed verbal waiver. We will make sure she signs physical waiver form as well. No additional questions today. Will f/u with my colleague who assisted with some housing resources to see if any updates/additional information I should provide pt.  Patient is participating in a Managed Medicaid Plan:  Yes- uhc  SDOH Screenings   Food Insecurity: Food Insecurity Present (10/26/2022)  Housing: Low Risk  (09/02/2022)  Transportation Needs: Unmet Transportation Needs (11/25/2022)  Utilities: Not At Risk (08/31/2022)  Depression (PHQ2-9): High Risk (12/13/2023)  Financial Resource Strain: Medium Risk (09/02/2022)  Physical Activity: Insufficiently Active (09/02/2022)  Social Connections: Moderately Isolated (09/02/2022)  Stress: Stress Concern Present (09/02/2022)  Tobacco Use: High Risk (01/23/2024)    Kendra Lark, MSW, LCSW Clinical Social Worker II [facility] Heart/Vascular Care Navigation  [phone]- work cell phone (preferred)   02/13/2024  Kendra Griffith MRN: [id number]   RIDER WAIVER AND RELEASE OF LIABILITY  For the purposes of helping with transportation needs, [facility] partners with outside transportation providers (taxi companies, [place], catering manager.) to give [facility] patients or other approved people the choice of on-demand rides Public Librarian) to our buildings for non-emergency visits.  By using [employer], I, the person signing this document, on behalf of myself and/or any legal minors (in my care using the [employer]), agree:  Science Writer given to me are supplied by independent, outside transportation providers who do not work for, or have any affiliation with, [employer]. [place] is not a transportation company. [place] has no control over the quality or safety of the  rides I get using [employer]. [place] has no control over whether any outside ride will happen on time or not. [place] gives no guarantee on the reliability, quality, safety, or availability on any rides, or that no mistakes will happen. I know and accept that traveling by vehicle (car, truck, SVU, fleeta, bus, taxi, etc.) has risks of serious injuries such as disability, being paralyzed, and death. I know and agree the risk of using [employer] is mine alone, and not [employer]. [employer] are provided as is and as are available. The transportation providers are in charge for all inspections and care of the vehicles used to provide these rides. I agree not to take legal action against [place], its agents, employees, officers, directors, representatives, insurers, attorneys, assigns, successors, subsidiaries, and affiliates at any time for any reasons related directly or indirectly to using [employer]. I also agree not to take legal action against [name] or its affiliates for any injury, death, or damage to property caused by or related to using [employer]. I have read this Waiver and Release of Liability, and I understand the terms used in it and their legal meaning. This Waiver is freely and voluntarily given with the understanding that my right (or any legal minors) to legal action against [place] relating to [employer] is knowingly given up to use these services.   I attest that I read the Ride Waiver and Release of Liability to Kendra Griffith, gave Ms. Wiedel the opportunity to ask questions and answered the questions asked (if any). I affirm that Kendra Griffith then provided consent for assistance with transportation.     Kendra Griffith

## 2024-02-13 NOTE — Telephone Encounter (Signed)
 MDT, [place] CRT-D:  False VT events due to [facility]

## 2024-02-13 NOTE — Telephone Encounter (Signed)
 H&V Care Navigation CSW Progress Note  Clinical Social Worker contacted patient by phone to f/u on ride request for stat appt tomorrow with device clinic. Contacted pt today at [phone], she is on the way to another appt. Has Medicaid transportation but not enough time in between to get ride arranged. Encouraged her to call me when appt is done, otherwise will give her a f/u call to complete verbal waiver. Vouchers sent to [employer]  Patient is participating in a Managed Medicaid Plan:  Yes- UHC  SDOH Screenings   Food Insecurity: Food Insecurity Present (10/26/2022)  Housing: Low Risk  (09/02/2022)  Transportation Needs: Unmet Transportation Needs (11/25/2022)  Utilities: Not At Risk (08/31/2022)  Depression (PHQ2-9): High Risk (12/13/2023)  Financial Resource Strain: Medium Risk (09/02/2022)  Physical Activity: Insufficiently Active (09/02/2022)  Social Connections: Moderately Isolated (09/02/2022)  Stress: Stress Concern Present (09/02/2022)  Tobacco Use: High Risk (01/23/2024)    Marit Lark, MSW, LCSW Clinical Social Worker II [facility] Heart/Vascular Care Navigation  [phone]- work cell phone (preferred)

## 2024-02-14 ENCOUNTER — Ambulatory Visit: Attending: Cardiology

## 2024-02-14 DIAGNOSIS — I5022 Chronic systolic (congestive) heart failure: Secondary | ICD-10-CM

## 2024-02-14 DIAGNOSIS — I428 Other cardiomyopathies: Secondary | ICD-10-CM

## 2024-02-14 LAB — CUP PACEART INCLINIC DEVICE CHECK
Date Time Interrogation Session: 20251105164317
Implantable Lead Connection Status: 753985
Implantable Lead Connection Status: 753985
Implantable Lead Connection Status: 753985
Implantable Lead Implant Date: 20241212
Implantable Lead Implant Date: 20241212
Implantable Lead Implant Date: 20241212
Implantable Lead Location: 753858
Implantable Lead Location: 753859
Implantable Lead Location: 753860
Implantable Lead Model: 4598
Implantable Lead Model: 5076
Implantable Pulse Generator Implant Date: 20241212

## 2024-02-14 NOTE — Progress Notes (Signed)
 Full device check not complete. Patient brought in acutely to assess and troubleshoot TWOS alerts. Presenting Rhythm AS/BP 88. Numerous false NSVT & VT events c/w TWOS. Medtronic Representative present during device check. EKG performed while interrogating device.   EKG showed QRS of w/ Adaptive Bi-V & LV only pacing. EKG showed QRS w/ Nonadaptive CRT pacing w/ V-V Pace delay at 0ms.   Instrinsic R wave morphology presents w/ large T waves unable to covered w/ sensitivity adjustments. Initial Adaptive CRT pacing determined not effectively overriding instrinsic beat. As a result device frequently oversensed instrinsic T wave. By programming Nonadaptive CRT pacing, intrinsic was suppressed therefore resulting in appropriate sensing.   Will recheck 1 week remote transmission for any additional TWOS post programming changes.   Programming Changes per Medtronic Representative: - Device initially programmed Adaptive Bi-V & LV and CHANGED to Nonadaptive CRT. - Programmed LV->RV w/ V-V Pace delay at 0ms; device initially programmed LV only w/ V-V Pace delay on auto. - New wavelet template updated and collected.

## 2024-02-19 ENCOUNTER — Other Ambulatory Visit (HOSPITAL_COMMUNITY): Payer: Self-pay

## 2024-02-19 ENCOUNTER — Other Ambulatory Visit: Payer: Self-pay

## 2024-02-20 ENCOUNTER — Other Ambulatory Visit: Payer: Self-pay

## 2024-02-21 ENCOUNTER — Telehealth: Payer: Self-pay

## 2024-02-21 ENCOUNTER — Ambulatory Visit: Attending: Nurse Practitioner

## 2024-02-21 NOTE — Telephone Encounter (Signed)
 Patient seen in the clinic on 11/5 to evaluate.

## 2024-02-21 NOTE — Telephone Encounter (Signed)
 Repeat Remote transmission reviewed today to re-eval programming changes made last week to [facility].   Programming changes made on 11/5. There are continued noted TWOS events numerous on 11/6; however, none since.  With some events the TWOS algorithm works appropriately, but majority it is failing to identify.    Sent to [place], (MDT rep) is reviewing the episodes and will give further recommendations.  Will wait for his evaluation before discussing next steps.

## 2024-02-21 NOTE — Telephone Encounter (Signed)
-----   Message from Nurse Rozelle KIDD sent at 02/14/2024  4:40 PM EST ----- Recheck 1 week remote transmission for any additional TWOS post programming changes.

## 2024-02-23 ENCOUNTER — Telehealth (HOSPITAL_COMMUNITY): Payer: Self-pay

## 2024-02-23 NOTE — Telephone Encounter (Signed)
 Called to confirm/remind patient of their appointment at the [facility] on 02/26/24.   Appointment:   [] Confirmed  [x] Left mess   [] No answer/No voice mail  [] VM Full/unable to leave message  [] Phone not in service  And  to bring in  all medications and/or complete list.

## 2024-02-23 NOTE — Telephone Encounter (Signed)
 Given patient has not had further episodes since 11/6, industry recommends re-evaluating another transmission in 1 week.   Acute remote check appt made to re-evaluate.

## 2024-02-26 ENCOUNTER — Ambulatory Visit (HOSPITAL_COMMUNITY)

## 2024-02-29 ENCOUNTER — Ambulatory Visit

## 2024-02-29 ENCOUNTER — Encounter: Payer: Self-pay | Admitting: Cardiology

## 2024-02-29 NOTE — Telephone Encounter (Signed)
 Additional transmission received.  Reviewed with industry.  Confirmed T wave algorithm programmed on.  Nothing further at this time.  Will continue to monitor.

## 2024-03-04 ENCOUNTER — Telehealth (HOSPITAL_COMMUNITY): Payer: Self-pay

## 2024-03-04 ENCOUNTER — Other Ambulatory Visit (HOSPITAL_COMMUNITY): Payer: Self-pay | Admitting: Cardiology

## 2024-03-04 ENCOUNTER — Telehealth (HOSPITAL_COMMUNITY): Payer: Self-pay | Admitting: Licensed Clinical Social Worker

## 2024-03-04 ENCOUNTER — Other Ambulatory Visit: Payer: Self-pay

## 2024-03-04 NOTE — Telephone Encounter (Signed)
 H&V Care Navigation CSW Progress Note  Clinical Social Worker consulted to assist with transportation to tomorrow appts.  CSW attempted to arrange through insurance but they will not assist unless urgent medically.  CSW arranged taxi to pick pt up at 9am  SDOH Screenings   Food Insecurity: Food Insecurity Present (10/26/2022)  Housing: Low Risk  (09/02/2022)  Transportation Needs: Unmet Transportation Needs (03/04/2024)  Utilities: Not At Risk (08/31/2022)  Depression (PHQ2-9): High Risk (12/13/2023)  Financial Resource Strain: Medium Risk (09/02/2022)  Physical Activity: Insufficiently Active (09/02/2022)  Social Connections: Moderately Isolated (09/02/2022)  Stress: Stress Concern Present (09/02/2022)  Tobacco Use: High Risk (01/23/2024)   03/04/2024  Kendra Griffith: 1965-04-22 MRN: [id number]   RIDER WAIVER AND RELEASE OF LIABILITY  For the purposes of helping with transportation needs, Kendra Griffith partners with outside transportation providers (taxi companies, Kendra Griffith, catering manager.) to give Kendra Griffith patients or other approved people the choice of on-demand rides Kendra Griffith) to our buildings for non-emergency visits.  By using Kendra Griffith, I, the person signing this document, on behalf of myself and/or any legal minors (in my care using the Kendra Griffith), agree:  Kendra Griffith given to me are supplied by independent, outside transportation providers who do not work for, or have any affiliation with, Kendra Griffith. Kendra Griffith is not a transportation company. Kendra Griffith has no control over the quality or safety of the rides I get using Kendra Griffith. Kendra Griffith has no control over whether any outside ride will happen on time or not. Kendra Griffith gives no guarantee on the reliability, quality, safety, or availability on any rides, or that no mistakes will happen. I know and accept that traveling by vehicle (car, truck, SVU, fleeta, bus, taxi, etc.) has risks of  serious injuries such as disability, being paralyzed, and death. I know and agree the risk of using Kendra Griffith is mine alone, and not [employer]. Kendra Griffith are provided as is and as are available. The transportation providers are in charge for all inspections and care of the vehicles used to provide these rides. I agree not to take legal action against Kendra Griffith, its agents, employees, officers, directors, representatives, insurers, attorneys, assigns, successors, subsidiaries, and affiliates at any time for any reasons related directly or indirectly to using Kendra Griffith. I also agree not to take legal action against Kendra Griffith or its affiliates for any injury, death, or damage to property caused by or related to using Kendra Griffith. I have read this Waiver and Release of Liability, and I understand the terms used in it and their legal meaning. This Waiver is freely and voluntarily given with the understanding that my right (or any legal minors) to legal action against Kendra Griffith relating to Kendra Griffith is knowingly given up to use these services.   I attest that I read the Ride Waiver and Release of Liability to Kendra Griffith, gave Kendra Griffith the opportunity to ask questions and answered the questions asked (if any). I affirm that Kendra Griffith then provided consent for assistance with transportation.     Kendra Griffith

## 2024-03-04 NOTE — Telephone Encounter (Signed)
 Called to confirm/remind patient of their appointment at the Advanced Heart Failure Clinic on 03/05/24 9:30.   Appointment:   [x] Confirmed  [] Left mess   [] No answer/No voice mail  [] VM Full/unable to leave message  [] Phone not in service  Patient reminded to bring all medications and/or complete list.  Confirmed patient has transportation. Gave directions, instructed to utilize valet parking.

## 2024-03-05 ENCOUNTER — Other Ambulatory Visit: Payer: Self-pay

## 2024-03-05 ENCOUNTER — Ambulatory Visit (HOSPITAL_COMMUNITY)
Admission: RE | Admit: 2024-03-05 | Discharge: 2024-03-05 | Disposition: A | Discharge status: Home / Self Care | Source: Ambulatory Visit | Attending: Cardiology | Admitting: Cardiology

## 2024-03-05 ENCOUNTER — Ambulatory Visit (HOSPITAL_COMMUNITY): Payer: Self-pay | Admitting: Cardiology

## 2024-03-05 ENCOUNTER — Other Ambulatory Visit (HOSPITAL_COMMUNITY): Payer: Self-pay

## 2024-03-05 ENCOUNTER — Encounter (HOSPITAL_COMMUNITY): Payer: Self-pay

## 2024-03-05 VITALS — BP 90/60 | HR 70 | Ht 65.0 in | Wt 159.6 lb

## 2024-03-05 DIAGNOSIS — I110 Hypertensive heart disease with heart failure: Secondary | ICD-10-CM | POA: Insufficient documentation

## 2024-03-05 DIAGNOSIS — I428 Other cardiomyopathies: Secondary | ICD-10-CM | POA: Insufficient documentation

## 2024-03-05 DIAGNOSIS — E119 Type 2 diabetes mellitus without complications: Secondary | ICD-10-CM | POA: Diagnosis not present

## 2024-03-05 DIAGNOSIS — F419 Anxiety disorder, unspecified: Secondary | ICD-10-CM | POA: Diagnosis not present

## 2024-03-05 DIAGNOSIS — Z7901 Long term (current) use of anticoagulants: Secondary | ICD-10-CM | POA: Diagnosis not present

## 2024-03-05 DIAGNOSIS — Z86718 Personal history of other venous thrombosis and embolism: Secondary | ICD-10-CM | POA: Insufficient documentation

## 2024-03-05 DIAGNOSIS — F1011 Alcohol abuse, in remission: Secondary | ICD-10-CM | POA: Diagnosis not present

## 2024-03-05 DIAGNOSIS — I82532 Chronic embolism and thrombosis of left popliteal vein: Secondary | ICD-10-CM

## 2024-03-05 DIAGNOSIS — I5022 Chronic systolic (congestive) heart failure: Secondary | ICD-10-CM | POA: Diagnosis not present

## 2024-03-05 DIAGNOSIS — I10 Essential (primary) hypertension: Secondary | ICD-10-CM

## 2024-03-05 DIAGNOSIS — Z7984 Long term (current) use of oral hypoglycemic drugs: Secondary | ICD-10-CM | POA: Insufficient documentation

## 2024-03-05 DIAGNOSIS — Z79899 Other long term (current) drug therapy: Secondary | ICD-10-CM | POA: Insufficient documentation

## 2024-03-05 DIAGNOSIS — Z592 Discord with neighbors, lodgers and landlord: Secondary | ICD-10-CM | POA: Diagnosis not present

## 2024-03-05 DIAGNOSIS — F319 Bipolar disorder, unspecified: Secondary | ICD-10-CM | POA: Insufficient documentation

## 2024-03-05 DIAGNOSIS — F17210 Nicotine dependence, cigarettes, uncomplicated: Secondary | ICD-10-CM | POA: Insufficient documentation

## 2024-03-05 DIAGNOSIS — Z95810 Presence of automatic (implantable) cardiac defibrillator: Secondary | ICD-10-CM | POA: Diagnosis not present

## 2024-03-05 LAB — BASIC METABOLIC PANEL WITH GFR
Anion gap: 10 (ref 5–15)
BUN: 20 mg/dL (ref 6–20)
CO2: 28 mmol/L (ref 22–32)
Calcium: 9.5 mg/dL (ref 8.9–10.3)
Chloride: 101 mmol/L (ref 98–111)
Creatinine, Ser: 1.3 mg/dL — ABNORMAL HIGH (ref 0.44–1.00)
GFR, Estimated: 48 mL/min — ABNORMAL LOW (ref >60)
Glucose, Bld: 108 mg/dL — ABNORMAL HIGH (ref 70–99)
Potassium: 3.9 mmol/L (ref 3.5–5.1)
Sodium: 139 mmol/L (ref 135–145)

## 2024-03-05 LAB — BRAIN NATRIURETIC PEPTIDE: B Natriuretic Peptide: 9.9 pg/mL (ref 0.0–100.0)

## 2024-03-05 MED ORDER — APIXABAN 5 MG PO TABS
5.0000 mg | ORAL_TABLET | Freq: Two times a day (BID) | ORAL | 6 refills | Status: AC
  Filled 2024-03-05: qty 60, 30d supply, fill #0
  Filled 2024-04-16: qty 60, 30d supply, fill #1
  Filled 2024-05-13 (×2): qty 60, 30d supply, fill #2

## 2024-03-05 NOTE — Progress Notes (Signed)
 Advanced Heart Failure Clinic Note    PCP: Theotis Haze ORN, NP HF Cardiologist: Dr. Rolan  58 y.o. AAF with past medical history of HTN, DM, tobacco use, anxiety, bipolar disorder, a remote history of ETOH abuse, and diagnosis of systolic heart failure and LE DVT in 5/24.   Admitted 5/24 with new acute systolic heart failure and LE DVT. Echo showed EF <20%, GIIIDD, RV mod reduced, mod elevated PASP ~58, LA mod dilated, RA mildly dilated, mild MR, mod TR. Hs trops negative. She was diuresed w/ IV Lasix  and started on GDMT. V/Q scan showed no perfusion defects to suggest PE. Hypercoagulable workup was negative. R/LHC showed normal coronary arteries, elevated filling pressures with low cardiac output (RA 6, PA 47/24/33, PA sat 57%, Fick CI 2.1). She was diuresed further w/ IV Lasix  and transitioned to PO torsemide . GDMT further titrated.   Cardiac MRI in 8/24 showed LV EF 25%, RV EF 36%, no delayed enhancement.    Echo 9/24 EF 25% with septal-lateral dyssynchrony, mild RV dysfunction, and IVC normal.   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   Medtronic CRT-D device placed in 12/24.   Echo 5/25 showed EF 35-40%, concern for possible vegetation on TR or device. She underwent TEE 6/25 showing EF 35-40%, normal RV, no clot or vegetation  She returns today for heart failure follow up. Overall feeling better. She get short of breath if she has to go up and down the stairs multiple times. NYHA II/early III. She has been packing, is waiting for housing change approval and will know by the end of the week. Denies chest pain, orthopnea, palpitations, dizziness, and abnormal bleeding. Able to perform ADLs. Appetite okay. SBP at home 100s. Compliant with all medications.  Has been dealing with a lot of stress d/t her housing situation. Currently receives government aid for housing. She reports a chaotic environment at current apartment and trouble  with her landlord. Relies on public transportation or family to get around.  Has been working with Jenna, HF SW to move to a better housing location.  Labs (7/24): K 4.6, creatinine 1.07 => 1.09, BNP 77 Labs (9/24): K 4.0, creatinine 1.10, BNP 50   Labs (11/24): K 4.1, creatinine 1.01 Labs (5/25): K 3.8, creatinine 1.25 Labs (7/25): K 4.1, Scr 1.04 Labs (09/25): LDL 41, Hgb 12.9  PMH: 1. DVT: DVT in 1980.  Left lower leg DVT in 5/24.  - Prothrombin gene mutation negative, factor V Leiden negative, lupus anticoagulant negative 2. Type 2 diabetes 3. GERD 4. HTN 5. Sickle cell trait 6. Active smoker 7. Prior ETOH abuse.  8. Bipolar disorder 9. Chronic systolic CHF: Nonischemic cardiomyopathy.  MDT CRT-D device.  Diagnosis by echo in 5/24 showing EF < 20%, grade III DD, RV moderately reduced, RVSP 58 mmHg, mild MR, moderate TR.  - R/LHC (5/24): normal coronaries; RA 6, PA 47/24 (33), PCWP 23, CO/CI (Fick) 3.4/2.1, PVR 2.6 WU.  - Cardiac MRI (8/24): LVEF 25%, RVEF 36%, no delayed enhancement.   - Echo (9/24): EF 25% with septal-lateral dyssynchrony, mild RV dysfunction, and IVC normal.  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  - Echo 5/25: EF 35-40%, concern for possible vegetation on TR or device.  - TEE (6/25): EF 35-40%, normal RV, no clot or vegetation  FH: Mother with CHF, ? History of rheumatic heart disease.  Sister with CHF.   Current Outpatient Medications  Medication  Sig Dispense Refill   acetaminophen -codeine  (TYLENOL  #3) 300-30 MG tablet Take 1-2 tablets by mouth every 4 (four) hours as needed for moderate pain (pain score 4-6). 60 tablet 0   albuterol  (VENTOLIN  HFA) 108 (90 Base) MCG/ACT inhaler Inhale 2 puffs into the lungs every 6 (six) hours as needed for wheezing or shortness of breath. 18 g 0   apixaban  (ELIQUIS ) 5 MG TABS tablet Take 1 tablet (5 mg total) by mouth 2 (two) times daily. 60 tablet 6   BIOTIN 5000 PO  Take 1 tablet by mouth daily.     bisoprolol  (ZEBETA ) 5 MG tablet Take 0.5 tablets (2.5 mg total) by mouth daily. 15 tablet 11   cholecalciferol (VITAMIN D3) 25 MCG (1000 UT) tablet Take 1,000 Units by mouth daily.     dapagliflozin  propanediol (FARXIGA ) 10 MG TABS tablet Take 1 tablet (10 mg total) by mouth daily. 30 tablet 11   famotidine  (PEPCID ) 20 MG tablet Take 1 tablet (20 mg total) by mouth 2 (two) times daily for heartburn 180 tablet 1   gabapentin  (NEURONTIN ) 300 MG capsule Take 1 capsule (300 mg total) by mouth 3 (three) times daily as needed (Pain). 90 capsule 3   hydrOXYzine  (ATARAX ) 50 MG tablet Take 1 tablet (50 mg total) by mouth 3 (three) times daily as needed. Please deliver 90 tablet 2   ibuprofen  (ADVIL ) 200 MG tablet Take 200-800 mg by mouth every 6 (six) hours as needed for moderate pain (pain score 4-6).     losartan  (COZAAR ) 25 MG tablet Take 0.5 tablets (12.5 mg total) by mouth at bedtime. 45 tablet 3   Menthol, Topical Analgesic, (BIOFREEZE EX) Apply 1 spray topically daily as needed (foot pain). Menthol     Multiple Vitamins-Minerals (MULTIVITAMIN WITH MINERALS) tablet Take 1 tablet by mouth daily.     potassium chloride  SA (KLOR-CON  M) 20 MEQ tablet Take 1 tablet (20 mEq total) by mouth daily. 90 tablet 3   promethazine -dextromethorphan (PROMETHAZINE -DM) 6.25-15 MG/5ML syrup Take 5 mLs by mouth 4 (four) times daily as needed for cough. 240 mL 0   rosuvastatin  (CRESTOR ) 5 MG tablet Take 1 tablet (5 mg total) by mouth daily for cholesterol 90 tablet 0   spironolactone  (ALDACTONE ) 25 MG tablet Take 1 tablet (25 mg total) by mouth daily. 90 tablet 3   tetrahydrozoline 0.05 % ophthalmic solution Place 1 drop into both eyes daily as needed (itching /red eyes).     torsemide  (DEMADEX ) 20 MG tablet Take 1 tablet (20 mg total) by mouth daily. 90 tablet 3   traZODone  (DESYREL ) 150 MG tablet Take 1-2 tablets (150-300 mg total) by mouth at bedtime. 180 tablet 0   trolamine  salicylate (ASPERCREME) 10 % cream Apply 1 Application topically daily as needed (Foot pain). With lidocaine      urea (CARMOL) 40 % CREA Apply 1 application  topically daily as needed (Foot pain).     vitamin B-12 (CYANOCOBALAMIN ) 500 MCG tablet Take 500 mcg by mouth daily as needed (Supplement).     No current facility-administered medications for this visit.   Allergies  Allergen Reactions   Amoxicillin     Pain   Darvocet [Propoxyphene N-Acetaminophen ] Nausea And Vomiting    Makes her jittery   Hydrocodone-Acetaminophen  Nausea And Vomiting    Upset stomach   Percocet [Oxycodone -Acetaminophen ] Other (See Comments)    Jittery   Valium [Diazepam] Other (See Comments)    Makes pt. Feel out of wack   Social History   Socioeconomic  History   Marital status: Single    Spouse name: Not on file   Number of children: 3   Years of education: 8-9   Highest education level: 8th grade  Occupational History   Occupation: applying for disability  Tobacco Use   Smoking status: Every Day    Current packs/day: 2.00    Average packs/day: 2.0 packs/day for 29.0 years (58.0 ttl pk-yrs)    Types: Cigarettes   Smokeless tobacco: Never  Vaping Use   Vaping status: Never Used  Substance and Sexual Activity   Alcohol  use: No    Alcohol /week: 0.0 standard drinks of alcohol     Comment: Stopped drinking 2013   Drug use: No   Sexual activity: Not Currently    Birth control/protection: None  Other Topics Concern   Not on file  Social History Narrative   Patient lives alone in an apartment on the first floor.  3 children.  Applying for disability.  Education: 8th or 9th grade.   Currently not working   Trying to go back to school online for [employer]   Six Grandchildren   Social Drivers of Health   Financial Resource Strain: Medium Risk (09/02/2022)   Overall Financial Resource Strain (CARDIA)    Difficulty of Paying Living Expenses: Somewhat hard  Food Insecurity: Food Insecurity Present  (10/26/2022)   Hunger Vital Sign    Worried About Running Out of Food in the Last Year: Sometimes true    Ran Out of Food in the Last Year: Sometimes true  Transportation Needs: Unmet Transportation Needs (03/04/2024)   PRAPARE - Administrator, Civil Service (Medical): Yes    Lack of Transportation (Non-Medical): Yes  Physical Activity: Insufficiently Active (09/02/2022)   Exercise Vital Sign    Days of Exercise per Week: 1 day    Minutes of Exercise per Session: 30 min  Stress: Stress Concern Present (09/02/2022)   [employer] of Occupational Health - Occupational Stress Questionnaire    Feeling of Stress : To some extent  Social Connections: Moderately Isolated (09/02/2022)   Social Connection and Isolation Panel    Frequency of Communication with Friends and Family: Three times a week    Frequency of Social Gatherings with Friends and Family: Once a week    Attends Religious Services: Never    Database Administrator or Organizations: Yes    Attends Banker Meetings: 1 to 4 times per year    Marital Status: Divorced  Intimate Partner Violence: Unknown (06/08/2017)   Humiliation, Afraid, Rape, and Kick questionnaire    Fear of Current or Ex-Partner: Patient declined    Emotionally Abused: Patient declined    Physically Abused: Patient declined    Sexually Abused: Patient declined   Wt Readings from Last 3 Encounters:  02/08/24 74.3 kg (163 lb 12.8 oz)  12/13/23 73.5 kg (162 lb)  10/31/23 72.5 kg (159 lb 12.8 oz)   Blood pressure 90/60, pulse 70, height 5' 5 (1.651 m), weight 72.4 kg (159 lb 9.6 oz), last menstrual period 03/12/2019, SpO2 98%.  PHYSICAL EXAM: General: Well appearing. No distress  Cardiac: JVP flat. No murmurs  Abdomen: Soft, non-distended.  Extremities: Warm and dry.  No edema.  Neuro: A&O x3. Affect pleasant.   Medtronic device interrogation (personally reviewed): 0 AT/AF, 0 VT, 95% BiV paced (effectively), activity level 4.4  h/day, Optivol an impedence significant improved  ASSESSMENT & PLAN: 1. Chronic Systolic CHF:  Nonischemic cardiomyopathy.  Medtronic CRT-D device.  Echo  5/24 with EF <20%, GIIIDD, RV mod reduced, mod elevated PASP ~58, LA mod dilated, RA mildly dilated, mild MR, mod TR. NICM. R/LHC showed normal coronaries, elevated filling pressures and low output, CI 2.1.  Cardiac MRI in 8/24 showed no delayed enhancement.  Echo was done 9/24, EF 25% with septal-lateral dyssynchrony, mild RV dysfunction, and IVC normal.  Etiology for cardiomyopathy uncertain. Previous history of heavy alcohol  use but quit a number of years ago. HIV negative.  No viral-type illness prior to diagnosis. She has a FH of CHF (sister and mother). 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 She has seen Dr. Fairy for genetic counseling. Echo 5/25 showed EF 35-40%.  - NYHA II/early III, smoking likely contributing. Volume significantly improved by Optivol. Euvolemic on exam. - BiV pacing efficacy improved 84>95% - continue torsemide  20 mg daily + 20 mEq potassium daily. BMET/BNP - continue spironolactone  25 mg daily.  - continue Farxiga  10 mg daily. - continue bisoprolol  2.5 mg at bedtime.  - continue losartan  12.5 mg at night. BP soft (no dizziness or lightheadedness), checks BP at home. 2. HTN: BP soft today. Checks at home, SBP has been 100. - Meds as above. 3. DMII: Continue SGLT2i. - A1c was 6.6 in 9/25 4. Tobacco Abuse: continues to smoke. -Discussed cessation. Not ready to quit especially with recent life stressors 5. DVT: Left leg DVT found 5/24.  Had DVT back in 1980s after she delivered her son. Hypercoagulable workup negative.  - Long-term Eliquis  given unprovoked DVT.   SDOH: Has medicaid. Meds are delivered to her. HF CSW has been working on getting her in better housing situation. Will know later this week if she was approved for new location.  Follow up in 3 month with  Dr. McLean   Deidrea Gaetz, NP 03/05/24

## 2024-03-05 NOTE — Patient Instructions (Signed)
 No medications changes today. Labs today - we will call you if abnormal. Return to see Dr. Rolan in 3 months.  PLEASE CALL [phone] IN FEBRUARY 2026 TO SCHEDULE THIS APPOINTMENT. Please call [phone] if any questions or concerns prior to your next appointment.

## 2024-03-05 NOTE — Addendum Note (Signed)
 Encounter addended by: [place] Geofm NOVAK, RN on: 03/05/2024 10:43 AM  Actions taken: Charge Capture section accepted

## 2024-03-05 NOTE — Telephone Encounter (Signed)
 Prescription refill request for Eliquis  received. Indication:dvt Last office visit:11/25 Scr: 1.06  10/25 Age:58 Weight:72.4  kg  Prescription refilled

## 2024-03-14 ENCOUNTER — Other Ambulatory Visit (HOSPITAL_COMMUNITY): Payer: Self-pay

## 2024-03-14 ENCOUNTER — Telehealth (HOSPITAL_COMMUNITY): Payer: Self-pay | Admitting: Cardiology

## 2024-03-14 ENCOUNTER — Other Ambulatory Visit: Payer: Self-pay

## 2024-03-15 ENCOUNTER — Ambulatory Visit (HOSPITAL_COMMUNITY)

## 2024-03-18 ENCOUNTER — Ambulatory Visit (HOSPITAL_COMMUNITY): Payer: Self-pay | Admitting: Cardiology

## 2024-03-18 ENCOUNTER — Ambulatory Visit (HOSPITAL_COMMUNITY): Admission: RE | Admit: 2024-03-18 | Discharge: 2024-03-18 | Attending: Cardiology

## 2024-03-18 DIAGNOSIS — I5022 Chronic systolic (congestive) heart failure: Secondary | ICD-10-CM

## 2024-03-18 LAB — BASIC METABOLIC PANEL WITH GFR
Anion gap: 5 (ref 5–15)
BUN: 13 mg/dL (ref 6–20)
CO2: 30 mmol/L (ref 22–32)
Calcium: 9.3 mg/dL (ref 8.9–10.3)
Chloride: 106 mmol/L (ref 98–111)
Creatinine, Ser: 1.1 mg/dL — ABNORMAL HIGH (ref 0.44–1.00)
GFR, Estimated: 58 mL/min — ABNORMAL LOW (ref >60)
Glucose, Bld: 97 mg/dL (ref 70–99)
Potassium: 4.4 mmol/L (ref 3.5–5.1)
Sodium: 141 mmol/L (ref 135–145)

## 2024-03-22 ENCOUNTER — Ambulatory Visit: Payer: Medicaid Other

## 2024-03-22 DIAGNOSIS — I5022 Chronic systolic (congestive) heart failure: Secondary | ICD-10-CM

## 2024-03-23 LAB — CUP PACEART REMOTE DEVICE CHECK
Battery Remaining Longevity: 124 mo
Battery Voltage: 3.02 V
Brady Statistic AP VP Percent: 2.13 %
Brady Statistic AP VS Percent: 0.03 %
Brady Statistic AS VP Percent: 93.29 %
Brady Statistic AS VS Percent: 4.55 %
Brady Statistic RA Percent Paced: 3.12 %
Brady Statistic RV Percent Paced: 95.42 %
Date Time Interrogation Session: 20251212034253
HighPow Impedance: 61 Ohm
Implantable Lead Connection Status: 753985
Implantable Lead Connection Status: 753985
Implantable Lead Connection Status: 753985
Implantable Lead Implant Date: 20241212
Implantable Lead Implant Date: 20241212
Implantable Lead Implant Date: 20241212
Implantable Lead Location: 753858
Implantable Lead Location: 753859
Implantable Lead Location: 753860
Implantable Lead Model: 4598
Implantable Lead Model: 5076
Implantable Pulse Generator Implant Date: 20241212
Lead Channel Impedance Value: 418 Ohm
Lead Channel Impedance Value: 418 Ohm
Lead Channel Impedance Value: 418 Ohm
Lead Channel Impedance Value: 418 Ohm
Lead Channel Impedance Value: 418 Ohm
Lead Channel Impedance Value: 437 Ohm
Lead Channel Impedance Value: 513 Ohm
Lead Channel Impedance Value: 589 Ohm
Lead Channel Impedance Value: 741 Ohm
Lead Channel Impedance Value: 760 Ohm
Lead Channel Impedance Value: 779 Ohm
Lead Channel Impedance Value: 817 Ohm
Lead Channel Impedance Value: 874 Ohm
Lead Channel Pacing Threshold Amplitude: 0.5 V
Lead Channel Pacing Threshold Amplitude: 0.5 V
Lead Channel Pacing Threshold Amplitude: 0.875 V
Lead Channel Pacing Threshold Pulse Width: 0.4 ms
Lead Channel Pacing Threshold Pulse Width: 0.4 ms
Lead Channel Pacing Threshold Pulse Width: 0.4 ms
Lead Channel Sensing Intrinsic Amplitude: 10.8 mV
Lead Channel Sensing Intrinsic Amplitude: 2.3 mV
Lead Channel Setting Pacing Amplitude: 1.5 V
Lead Channel Setting Pacing Amplitude: 1.5 V
Lead Channel Setting Pacing Amplitude: 2 V
Lead Channel Setting Pacing Pulse Width: 0.4 ms
Lead Channel Setting Pacing Pulse Width: 0.4 ms
Lead Channel Setting Sensing Sensitivity: 0.3 mV
Zone Setting Status: 755011
Zone Setting Status: 755011
Zone Setting Status: 755011

## 2024-03-25 ENCOUNTER — Ambulatory Visit: Payer: Self-pay | Admitting: Cardiology

## 2024-03-28 NOTE — Progress Notes (Signed)
 Remote ICD Transmission

## 2024-04-01 ENCOUNTER — Encounter: Payer: Self-pay | Admitting: Cardiology

## 2024-04-08 ENCOUNTER — Other Ambulatory Visit: Payer: Self-pay

## 2024-04-08 ENCOUNTER — Other Ambulatory Visit (HOSPITAL_COMMUNITY): Payer: Self-pay

## 2024-04-16 ENCOUNTER — Other Ambulatory Visit: Payer: Self-pay | Admitting: Nurse Practitioner

## 2024-04-16 ENCOUNTER — Other Ambulatory Visit (HOSPITAL_COMMUNITY): Payer: Self-pay

## 2024-04-16 DIAGNOSIS — E785 Hyperlipidemia, unspecified: Secondary | ICD-10-CM

## 2024-04-16 MED ORDER — ROSUVASTATIN CALCIUM 5 MG PO TABS
5.0000 mg | ORAL_TABLET | Freq: Every day | ORAL | 0 refills | Status: AC
  Filled 2024-04-16: qty 90, 90d supply, fill #0

## 2024-04-17 ENCOUNTER — Other Ambulatory Visit (HOSPITAL_COMMUNITY): Payer: Self-pay

## 2024-04-18 ENCOUNTER — Ambulatory Visit: Admitting: Psychology

## 2024-05-13 ENCOUNTER — Other Ambulatory Visit (HOSPITAL_COMMUNITY): Payer: Self-pay

## 2024-05-13 ENCOUNTER — Other Ambulatory Visit: Payer: Self-pay | Admitting: Nurse Practitioner

## 2024-05-13 ENCOUNTER — Other Ambulatory Visit: Payer: Self-pay

## 2024-05-13 DIAGNOSIS — F419 Anxiety disorder, unspecified: Secondary | ICD-10-CM

## 2024-05-13 DIAGNOSIS — G894 Chronic pain syndrome: Secondary | ICD-10-CM

## 2024-05-14 ENCOUNTER — Other Ambulatory Visit (HOSPITAL_COMMUNITY): Payer: Self-pay

## 2024-05-14 ENCOUNTER — Other Ambulatory Visit: Payer: Self-pay

## 2024-05-14 MED ORDER — GABAPENTIN 300 MG PO CAPS
300.0000 mg | ORAL_CAPSULE | Freq: Three times a day (TID) | ORAL | 3 refills | Status: AC | PRN
  Filled 2024-05-14 – 2024-05-15 (×2): qty 90, 30d supply, fill #0

## 2024-05-15 ENCOUNTER — Other Ambulatory Visit: Payer: Self-pay

## 2024-05-15 ENCOUNTER — Other Ambulatory Visit (HOSPITAL_COMMUNITY): Payer: Self-pay

## 2024-05-21 ENCOUNTER — Encounter: Admitting: Psychology

## 2024-06-11 ENCOUNTER — Ambulatory Visit: Admitting: Nurse Practitioner

## 2024-06-13 ENCOUNTER — Ambulatory Visit: Admitting: Physician Assistant
